# Patient Record
Sex: Female | Born: 1950 | Race: White | Hispanic: No | Marital: Single | Smoking: Former smoker
Health system: Southern US, Community
[De-identification: ages and names within clinical notes are randomized; demographics above are authoritative.]

## PROBLEM LIST (undated history)

## (undated) DIAGNOSIS — C539 Malignant neoplasm of cervix uteri, unspecified: Secondary | ICD-10-CM

## (undated) DIAGNOSIS — I509 Heart failure, unspecified: Secondary | ICD-10-CM

## (undated) DIAGNOSIS — G43909 Migraine, unspecified, not intractable, without status migrainosus: Secondary | ICD-10-CM

## (undated) DIAGNOSIS — K8309 Other cholangitis: Secondary | ICD-10-CM

## (undated) DIAGNOSIS — D649 Anemia, unspecified: Secondary | ICD-10-CM

## (undated) DIAGNOSIS — M8080XA Other osteoporosis with current pathological fracture, unspecified site, initial encounter for fracture: Secondary | ICD-10-CM

## (undated) DIAGNOSIS — C801 Malignant (primary) neoplasm, unspecified: Secondary | ICD-10-CM

## (undated) DIAGNOSIS — E785 Hyperlipidemia, unspecified: Secondary | ICD-10-CM

## (undated) DIAGNOSIS — K219 Gastro-esophageal reflux disease without esophagitis: Secondary | ICD-10-CM

## (undated) DIAGNOSIS — I5189 Other ill-defined heart diseases: Secondary | ICD-10-CM

## (undated) DIAGNOSIS — M7989 Other specified soft tissue disorders: Secondary | ICD-10-CM

## (undated) DIAGNOSIS — I82409 Acute embolism and thrombosis of unspecified deep veins of unspecified lower extremity: Principal | ICD-10-CM

## (undated) DIAGNOSIS — M1990 Unspecified osteoarthritis, unspecified site: Secondary | ICD-10-CM

## (undated) DIAGNOSIS — I4891 Unspecified atrial fibrillation: Secondary | ICD-10-CM

## (undated) DIAGNOSIS — I890 Lymphedema, not elsewhere classified: Secondary | ICD-10-CM

## (undated) DIAGNOSIS — IMO0001 Reserved for inherently not codable concepts without codable children: Secondary | ICD-10-CM

## (undated) DIAGNOSIS — R7881 Bacteremia: Secondary | ICD-10-CM

## (undated) DIAGNOSIS — K760 Fatty (change of) liver, not elsewhere classified: Secondary | ICD-10-CM

## (undated) DIAGNOSIS — H409 Unspecified glaucoma: Secondary | ICD-10-CM

## (undated) DIAGNOSIS — I10 Essential (primary) hypertension: Secondary | ICD-10-CM

## (undated) DIAGNOSIS — G5600 Carpal tunnel syndrome, unspecified upper limb: Secondary | ICD-10-CM

## (undated) DIAGNOSIS — N279 Small kidney, unspecified: Secondary | ICD-10-CM

## (undated) DIAGNOSIS — K831 Obstruction of bile duct: Secondary | ICD-10-CM

## (undated) DIAGNOSIS — IMO0002 Reserved for concepts with insufficient information to code with codable children: Secondary | ICD-10-CM

## (undated) HISTORY — DX: Reserved for inherently not codable concepts without codable children: IMO0001

## (undated) HISTORY — DX: Unspecified glaucoma: H40.9

## (undated) HISTORY — DX: Acute embolism and thrombosis of unspecified deep veins of unspecified lower extremity: I82.409

## (undated) HISTORY — DX: Anemia, unspecified: D64.9

## (undated) HISTORY — DX: Other osteoporosis with current pathological fracture, unspecified site, initial encounter for fracture: M80.80XA

## (undated) HISTORY — DX: Migraine, unspecified, not intractable, without status migrainosus: G43.909

## (undated) HISTORY — DX: Other specified soft tissue disorders: M79.89

## (undated) HISTORY — DX: Carpal tunnel syndrome, unspecified upper limb: G56.00

## (undated) HISTORY — DX: Lymphedema, not elsewhere classified: I89.0

## (undated) HISTORY — DX: Reserved for concepts with insufficient information to code with codable children: IMO0002

## (undated) HISTORY — DX: Essential (primary) hypertension: I10

## (undated) HISTORY — DX: Malignant neoplasm of cervix uteri, unspecified: C53.9

## (undated) HISTORY — PX: TONSILLECTOMY: SUR1361

## (undated) HISTORY — DX: Other ill-defined heart diseases: I51.89

## (undated) HISTORY — DX: Fatty (change of) liver, not elsewhere classified: K76.0

## (undated) HISTORY — PX: URETERAL STENT PLACEMENT: SHX822

## (undated) HISTORY — DX: Hyperlipidemia, unspecified: E78.5

## (undated) HISTORY — PX: CARDIOVERSION: SHX1299

## (undated) HISTORY — DX: Unspecified atrial fibrillation: I48.91

---

## 1982-03-01 HISTORY — PX: CHOLECYSTECTOMY: SHX55

## 2003-02-01 ENCOUNTER — Encounter (INDEPENDENT_AMBULATORY_CARE_PROVIDER_SITE_OTHER): Payer: Self-pay | Admitting: *Deleted

## 2003-02-01 ENCOUNTER — Encounter: Admission: RE | Admit: 2003-02-01 | Discharge: 2003-02-01 | Payer: Self-pay | Admitting: Family Medicine

## 2003-03-02 HISTORY — PX: TOTAL ABDOMINAL HYSTERECTOMY: SHX209

## 2003-03-02 HISTORY — PX: OOPHORECTOMY: SHX86

## 2003-03-25 ENCOUNTER — Ambulatory Visit (HOSPITAL_COMMUNITY): Admission: RE | Admit: 2003-03-25 | Discharge: 2003-03-25 | Payer: Self-pay | Admitting: Obstetrics and Gynecology

## 2003-03-25 ENCOUNTER — Encounter (INDEPENDENT_AMBULATORY_CARE_PROVIDER_SITE_OTHER): Payer: Self-pay | Admitting: Specialist

## 2003-03-27 ENCOUNTER — Emergency Department (HOSPITAL_COMMUNITY): Admission: EM | Admit: 2003-03-27 | Discharge: 2003-03-27 | Payer: Self-pay | Admitting: Emergency Medicine

## 2003-03-29 ENCOUNTER — Encounter: Payer: Self-pay | Admitting: Obstetrics & Gynecology

## 2003-03-29 ENCOUNTER — Inpatient Hospital Stay (HOSPITAL_COMMUNITY): Admission: EM | Admit: 2003-03-29 | Discharge: 2003-04-04 | Payer: Self-pay | Admitting: Emergency Medicine

## 2003-04-23 ENCOUNTER — Ambulatory Visit: Admission: RE | Admit: 2003-04-23 | Discharge: 2003-04-23 | Payer: Self-pay | Admitting: Gynecologic Oncology

## 2003-05-15 ENCOUNTER — Encounter (INDEPENDENT_AMBULATORY_CARE_PROVIDER_SITE_OTHER): Payer: Self-pay | Admitting: Specialist

## 2003-05-15 ENCOUNTER — Inpatient Hospital Stay (HOSPITAL_COMMUNITY): Admission: RE | Admit: 2003-05-15 | Discharge: 2003-05-19 | Payer: Self-pay | Admitting: Family Medicine

## 2003-07-09 ENCOUNTER — Ambulatory Visit: Admission: RE | Admit: 2003-07-09 | Discharge: 2003-07-09 | Payer: Self-pay | Admitting: Gynecologic Oncology

## 2003-07-11 ENCOUNTER — Ambulatory Visit (HOSPITAL_COMMUNITY): Admission: RE | Admit: 2003-07-11 | Discharge: 2003-07-11 | Payer: Self-pay | Admitting: Urology

## 2003-10-17 ENCOUNTER — Encounter: Admission: RE | Admit: 2003-10-17 | Discharge: 2003-10-17 | Payer: Self-pay | Admitting: Obstetrics and Gynecology

## 2003-10-17 ENCOUNTER — Encounter (INDEPENDENT_AMBULATORY_CARE_PROVIDER_SITE_OTHER): Payer: Self-pay | Admitting: *Deleted

## 2004-03-01 ENCOUNTER — Encounter (INDEPENDENT_AMBULATORY_CARE_PROVIDER_SITE_OTHER): Payer: Self-pay | Admitting: *Deleted

## 2004-08-14 ENCOUNTER — Ambulatory Visit: Payer: Self-pay | Admitting: Cardiovascular Disease

## 2004-08-14 ENCOUNTER — Inpatient Hospital Stay (HOSPITAL_COMMUNITY): Admission: EM | Admit: 2004-08-14 | Discharge: 2004-08-15 | Payer: Self-pay | Admitting: Emergency Medicine

## 2004-08-28 ENCOUNTER — Ambulatory Visit: Payer: Self-pay

## 2004-10-01 ENCOUNTER — Ambulatory Visit: Payer: Self-pay | Admitting: Internal Medicine

## 2005-01-14 ENCOUNTER — Encounter: Payer: Self-pay | Admitting: Family Medicine

## 2005-01-14 ENCOUNTER — Ambulatory Visit: Payer: Self-pay | Admitting: *Deleted

## 2005-01-19 ENCOUNTER — Ambulatory Visit: Payer: Self-pay | Admitting: Internal Medicine

## 2006-07-11 ENCOUNTER — Ambulatory Visit: Payer: Self-pay | Admitting: Internal Medicine

## 2006-07-18 ENCOUNTER — Ambulatory Visit: Payer: Self-pay | Admitting: Internal Medicine

## 2006-07-22 ENCOUNTER — Ambulatory Visit: Payer: Self-pay | Admitting: Internal Medicine

## 2006-08-10 ENCOUNTER — Ambulatory Visit: Payer: Self-pay | Admitting: Internal Medicine

## 2006-10-19 ENCOUNTER — Ambulatory Visit: Payer: Self-pay | Admitting: Internal Medicine

## 2006-12-01 ENCOUNTER — Ambulatory Visit: Payer: Self-pay | Admitting: Internal Medicine

## 2007-03-02 HISTORY — PX: OTHER SURGICAL HISTORY: SHX169

## 2007-09-12 ENCOUNTER — Ambulatory Visit: Payer: Self-pay | Admitting: Internal Medicine

## 2007-09-12 LAB — CONVERTED CEMR LAB
BUN: 21 mg/dL
Basophils Absolute: 0 K/uL
Basophils Relative: 0.4 %
CO2: 27 meq/L
Calcium: 9.9 mg/dL
Chloride: 103 meq/L
Creatinine, Ser: 1.3 mg/dL — ABNORMAL HIGH
Eosinophils Absolute: 0.3 K/uL
Eosinophils Relative: 5.6 % — ABNORMAL HIGH
GFR calc Af Amer: 55 mL/min
GFR calc non Af Amer: 45 mL/min
Glucose, Bld: 81 mg/dL
HCT: 38.4 %
Hemoglobin: 13.4 g/dL
INR: 0.9
Lymphocytes Relative: 27.1 %
MCHC: 35 g/dL
MCV: 90.3 fL
Monocytes Absolute: 0.7 K/uL
Monocytes Relative: 10.9 %
Neutro Abs: 3.4 K/uL
Neutrophils Relative %: 56 %
Platelets: 250 K/uL
Potassium: 4.4 meq/L
Prothrombin Time: 11.1 s
RBC: 4.25 M/uL
RDW: 13.3 %
Sodium: 139 meq/L
WBC: 6 10*3/microliter
aPTT: 27 s

## 2007-09-18 ENCOUNTER — Ambulatory Visit: Payer: Self-pay | Admitting: Cardiovascular Disease

## 2007-09-19 ENCOUNTER — Ambulatory Visit: Payer: Self-pay | Admitting: Cardiology

## 2007-09-20 ENCOUNTER — Ambulatory Visit: Payer: Self-pay | Admitting: Internal Medicine

## 2007-09-20 ENCOUNTER — Ambulatory Visit (HOSPITAL_COMMUNITY): Admission: RE | Admit: 2007-09-20 | Discharge: 2007-09-20 | Payer: Self-pay | Admitting: Internal Medicine

## 2007-09-20 ENCOUNTER — Encounter: Payer: Self-pay | Admitting: Internal Medicine

## 2007-10-03 ENCOUNTER — Ambulatory Visit: Payer: Self-pay | Admitting: Internal Medicine

## 2007-10-03 ENCOUNTER — Ambulatory Visit: Payer: Self-pay | Admitting: Cardiology

## 2007-10-03 LAB — CONVERTED CEMR LAB
BUN: 25 mg/dL — ABNORMAL HIGH (ref 6–23)
Chloride: 104 meq/L (ref 96–112)
GFR calc non Af Amer: 41 mL/min
Potassium: 4.6 meq/L (ref 3.5–5.1)

## 2007-10-05 ENCOUNTER — Ambulatory Visit: Payer: Self-pay | Admitting: Internal Medicine

## 2007-10-25 ENCOUNTER — Ambulatory Visit: Payer: Self-pay | Admitting: Internal Medicine

## 2007-10-25 ENCOUNTER — Ambulatory Visit: Payer: Self-pay | Admitting: Cardiovascular Disease

## 2007-11-22 ENCOUNTER — Ambulatory Visit: Payer: Self-pay | Admitting: Cardiology

## 2007-12-05 ENCOUNTER — Ambulatory Visit: Payer: Self-pay | Admitting: Cardiovascular Disease

## 2007-12-21 ENCOUNTER — Ambulatory Visit: Payer: Self-pay | Admitting: Internal Medicine

## 2007-12-21 ENCOUNTER — Ambulatory Visit: Payer: Self-pay | Admitting: Cardiology

## 2007-12-21 LAB — CONVERTED CEMR LAB
CO2: 27 meq/L (ref 19–32)
Chloride: 106 meq/L (ref 96–112)
Creatinine, Ser: 1.4 mg/dL — ABNORMAL HIGH (ref 0.4–1.2)
Magnesium: 2.5 mg/dL (ref 1.5–2.5)
Potassium: 4.6 meq/L (ref 3.5–5.1)
TSH: 2.34 microintl units/mL (ref 0.35–5.50)
Total CK: 86 units/L (ref 7–177)

## 2008-01-04 ENCOUNTER — Ambulatory Visit: Payer: Self-pay | Admitting: Cardiology

## 2008-01-22 ENCOUNTER — Ambulatory Visit: Payer: Self-pay | Admitting: Cardiology

## 2008-02-22 ENCOUNTER — Ambulatory Visit: Payer: Self-pay | Admitting: Internal Medicine

## 2008-03-13 ENCOUNTER — Ambulatory Visit: Payer: Self-pay | Admitting: Internal Medicine

## 2008-03-25 ENCOUNTER — Ambulatory Visit: Payer: Self-pay | Admitting: Internal Medicine

## 2008-04-17 ENCOUNTER — Ambulatory Visit (HOSPITAL_COMMUNITY): Admission: RE | Admit: 2008-04-17 | Discharge: 2008-04-17 | Payer: Self-pay | Admitting: Urology

## 2008-04-22 ENCOUNTER — Ambulatory Visit (HOSPITAL_COMMUNITY): Admission: RE | Admit: 2008-04-22 | Discharge: 2008-04-22 | Payer: Self-pay | Admitting: Urology

## 2008-04-29 ENCOUNTER — Ambulatory Visit (HOSPITAL_COMMUNITY): Admission: RE | Admit: 2008-04-29 | Discharge: 2008-04-29 | Payer: Self-pay | Admitting: Urology

## 2008-05-02 ENCOUNTER — Encounter (INDEPENDENT_AMBULATORY_CARE_PROVIDER_SITE_OTHER): Payer: Self-pay | Admitting: Interventional Radiology

## 2008-05-02 ENCOUNTER — Ambulatory Visit (HOSPITAL_COMMUNITY): Admission: RE | Admit: 2008-05-02 | Discharge: 2008-05-02 | Payer: Self-pay | Admitting: Urology

## 2008-05-15 ENCOUNTER — Ambulatory Visit: Payer: Self-pay | Admitting: Oncology

## 2008-05-16 ENCOUNTER — Ambulatory Visit (HOSPITAL_COMMUNITY): Admission: RE | Admit: 2008-05-16 | Discharge: 2008-05-16 | Payer: Self-pay | Admitting: Gynecology

## 2008-05-16 ENCOUNTER — Ambulatory Visit: Admission: RE | Admit: 2008-05-16 | Discharge: 2008-07-30 | Payer: Self-pay | Admitting: Radiation Oncology

## 2008-05-30 ENCOUNTER — Encounter: Payer: Self-pay | Admitting: Internal Medicine

## 2008-05-30 LAB — CBC WITH DIFFERENTIAL/PLATELET
BASO%: 0.9 % (ref 0.0–2.0)
HCT: 35 % (ref 34.8–46.6)
MCHC: 35.2 g/dL (ref 31.5–36.0)
MONO#: 0.5 10*3/uL (ref 0.1–0.9)
NEUT%: 53 % (ref 38.4–76.8)
RDW: 13.1 % (ref 11.2–14.5)
WBC: 4.7 10*3/uL (ref 3.9–10.3)
lymph#: 1.4 10*3/uL (ref 0.9–3.3)

## 2008-05-30 LAB — COMPREHENSIVE METABOLIC PANEL
ALT: 50 U/L — ABNORMAL HIGH (ref 0–35)
Albumin: 4.3 g/dL (ref 3.5–5.2)
CO2: 21 mEq/L (ref 19–32)
Calcium: 9.8 mg/dL (ref 8.4–10.5)
Chloride: 106 mEq/L (ref 96–112)
Creatinine, Ser: 1.45 mg/dL — ABNORMAL HIGH (ref 0.40–1.20)
Potassium: 4.6 mEq/L (ref 3.5–5.3)
Sodium: 138 mEq/L (ref 135–145)
Total Protein: 7.2 g/dL (ref 6.0–8.3)

## 2008-05-30 LAB — MAGNESIUM: Magnesium: 2.3 mg/dL (ref 1.5–2.5)

## 2008-06-05 ENCOUNTER — Ambulatory Visit: Payer: Self-pay | Admitting: Internal Medicine

## 2008-06-10 LAB — COMPREHENSIVE METABOLIC PANEL
ALT: 63 U/L — ABNORMAL HIGH (ref 0–35)
CO2: 21 mEq/L (ref 19–32)
Creatinine, Ser: 1.06 mg/dL (ref 0.40–1.20)
Total Bilirubin: 0.3 mg/dL (ref 0.3–1.2)

## 2008-06-10 LAB — CBC WITH DIFFERENTIAL/PLATELET
BASO%: 0.6 % (ref 0.0–2.0)
EOS%: 4.6 % (ref 0.0–7.0)
MCH: 29.2 pg (ref 25.1–34.0)
MCHC: 34.3 g/dL (ref 31.5–36.0)
MONO%: 8.9 % (ref 0.0–14.0)
RBC: 4.24 10*6/uL (ref 3.70–5.45)
RDW: 13.4 % (ref 11.2–14.5)
lymph#: 1.3 10*3/uL (ref 0.9–3.3)

## 2008-06-14 ENCOUNTER — Ambulatory Visit: Payer: Self-pay | Admitting: Cardiology

## 2008-06-14 ENCOUNTER — Ambulatory Visit (HOSPITAL_COMMUNITY): Admission: RE | Admit: 2008-06-14 | Discharge: 2008-06-14 | Payer: Self-pay | Admitting: Oncology

## 2008-06-14 LAB — COMPREHENSIVE METABOLIC PANEL
BUN: 20 mg/dL (ref 6–23)
CO2: 29 mEq/L (ref 19–32)
Calcium: 10.2 mg/dL (ref 8.4–10.5)
Creatinine, Ser: 1.23 mg/dL — ABNORMAL HIGH (ref 0.40–1.20)
Glucose, Bld: 107 mg/dL — ABNORMAL HIGH (ref 70–99)
Total Bilirubin: 0.8 mg/dL (ref 0.3–1.2)

## 2008-06-14 LAB — CBC WITH DIFFERENTIAL/PLATELET
Basophils Absolute: 0 10*3/uL (ref 0.0–0.1)
Eosinophils Absolute: 0.1 10*3/uL (ref 0.0–0.5)
HCT: 37.5 % (ref 34.8–46.6)
HGB: 13 g/dL (ref 11.6–15.9)
LYMPH%: 22.3 % (ref 14.0–49.7)
MCHC: 34.7 g/dL (ref 31.5–36.0)
MONO#: 0.5 10*3/uL (ref 0.1–0.9)
NEUT#: 3.2 10*3/uL (ref 1.5–6.5)
NEUT%: 66.5 % (ref 38.4–76.8)
Platelets: 245 10*3/uL (ref 145–400)
WBC: 4.8 10*3/uL (ref 3.9–10.3)

## 2008-06-14 LAB — URINALYSIS, MICROSCOPIC - CHCC
Nitrite: NEGATIVE
Protein: 100 mg/dL
Specific Gravity, Urine: 1.015 (ref 1.003–1.035)

## 2008-06-14 LAB — MAGNESIUM: Magnesium: 2.4 mg/dL (ref 1.5–2.5)

## 2008-06-17 ENCOUNTER — Ambulatory Visit (HOSPITAL_COMMUNITY): Admission: RE | Admit: 2008-06-17 | Discharge: 2008-06-17 | Payer: Self-pay | Admitting: Oncology

## 2008-06-17 LAB — BASIC METABOLIC PANEL
BUN: 14 mg/dL (ref 6–23)
Creatinine, Ser: 1.16 mg/dL (ref 0.40–1.20)
Glucose, Bld: 112 mg/dL — ABNORMAL HIGH (ref 70–99)
Potassium: 4.6 mEq/L (ref 3.5–5.3)

## 2008-06-18 LAB — URINE CULTURE

## 2008-06-20 ENCOUNTER — Ambulatory Visit: Payer: Self-pay | Admitting: Internal Medicine

## 2008-06-21 ENCOUNTER — Encounter: Payer: Self-pay | Admitting: Internal Medicine

## 2008-06-21 LAB — CBC WITH DIFFERENTIAL/PLATELET
BASO%: 0.5 % (ref 0.0–2.0)
LYMPH%: 20.6 % (ref 14.0–49.7)
MCHC: 35.2 g/dL (ref 31.5–36.0)
MONO#: 0.5 10*3/uL (ref 0.1–0.9)
MONO%: 17.1 % — ABNORMAL HIGH (ref 0.0–14.0)
Platelets: 217 10*3/uL (ref 145–400)
RBC: 4.18 10*6/uL (ref 3.70–5.45)
WBC: 3 10*3/uL — ABNORMAL LOW (ref 3.9–10.3)

## 2008-06-21 LAB — COMPREHENSIVE METABOLIC PANEL
ALT: 34 U/L (ref 0–35)
Alkaline Phosphatase: 99 U/L (ref 39–117)
Sodium: 131 mEq/L — ABNORMAL LOW (ref 135–145)
Total Bilirubin: 0.4 mg/dL (ref 0.3–1.2)
Total Protein: 7.2 g/dL (ref 6.0–8.3)

## 2008-06-28 LAB — CBC WITH DIFFERENTIAL/PLATELET
BASO%: 0.5 % (ref 0.0–2.0)
EOS%: 4.4 % (ref 0.0–7.0)
HCT: 31.7 % — ABNORMAL LOW (ref 34.8–46.6)
LYMPH%: 23.5 % (ref 14.0–49.7)
MCH: 29.3 pg (ref 25.1–34.0)
MCHC: 34.1 g/dL (ref 31.5–36.0)
NEUT%: 55.9 % (ref 38.4–76.8)
Platelets: 103 10*3/uL — ABNORMAL LOW (ref 145–400)

## 2008-06-28 LAB — COMPREHENSIVE METABOLIC PANEL
AST: 15 U/L (ref 0–37)
Albumin: 3.6 g/dL (ref 3.5–5.2)
BUN: 26 mg/dL — ABNORMAL HIGH (ref 6–23)
CO2: 25 mEq/L (ref 19–32)
Calcium: 8.8 mg/dL (ref 8.4–10.5)
Chloride: 104 mEq/L (ref 96–112)
Creatinine, Ser: 1.21 mg/dL — ABNORMAL HIGH (ref 0.40–1.20)
Glucose, Bld: 114 mg/dL — ABNORMAL HIGH (ref 70–99)
Potassium: 4.5 mEq/L (ref 3.5–5.3)

## 2008-06-28 LAB — MAGNESIUM: Magnesium: 1.9 mg/dL (ref 1.5–2.5)

## 2008-07-01 ENCOUNTER — Ambulatory Visit: Payer: Self-pay | Admitting: Oncology

## 2008-07-05 ENCOUNTER — Encounter: Payer: Self-pay | Admitting: Internal Medicine

## 2008-07-05 LAB — CBC WITH DIFFERENTIAL/PLATELET
BASO%: 0.5 % (ref 0.0–2.0)
EOS%: 3.6 % (ref 0.0–7.0)
Eosinophils Absolute: 0.1 10*3/uL (ref 0.0–0.5)
MCV: 86 fL (ref 79.5–101.0)
MONO%: 12.2 % (ref 0.0–14.0)
NEUT#: 1.2 10*3/uL — ABNORMAL LOW (ref 1.5–6.5)
RBC: 3.21 10*6/uL — ABNORMAL LOW (ref 3.70–5.45)
RDW: 14.2 % (ref 11.2–14.5)

## 2008-07-05 LAB — MAGNESIUM: Magnesium: 1.9 mg/dL (ref 1.5–2.5)

## 2008-07-05 LAB — BASIC METABOLIC PANEL
Potassium: 4.1 mEq/L (ref 3.5–5.3)
Sodium: 139 mEq/L (ref 135–145)

## 2008-07-10 ENCOUNTER — Ambulatory Visit: Payer: Self-pay | Admitting: Cardiology

## 2008-07-12 ENCOUNTER — Encounter: Payer: Self-pay | Admitting: Internal Medicine

## 2008-07-12 LAB — COMPREHENSIVE METABOLIC PANEL
AST: 15 U/L (ref 0–37)
Alkaline Phosphatase: 119 U/L — ABNORMAL HIGH (ref 39–117)
Glucose, Bld: 103 mg/dL — ABNORMAL HIGH (ref 70–99)
Sodium: 135 mEq/L (ref 135–145)
Total Bilirubin: 0.5 mg/dL (ref 0.3–1.2)
Total Protein: 7.1 g/dL (ref 6.0–8.3)

## 2008-07-12 LAB — CBC WITH DIFFERENTIAL/PLATELET
BASO%: 0.5 % (ref 0.0–2.0)
Basophils Absolute: 0 10*3/uL (ref 0.0–0.1)
EOS%: 1.8 % (ref 0.0–7.0)
HCT: 29.2 % — ABNORMAL LOW (ref 34.8–46.6)
HGB: 10.2 g/dL — ABNORMAL LOW (ref 11.6–15.9)
MONO#: 0.4 10*3/uL (ref 0.1–0.9)
NEUT#: 1.7 10*3/uL (ref 1.5–6.5)
NEUT%: 63.3 % (ref 38.4–76.8)
WBC: 2.7 10*3/uL — ABNORMAL LOW (ref 3.9–10.3)
lymph#: 0.5 10*3/uL — ABNORMAL LOW (ref 0.9–3.3)

## 2008-07-12 LAB — URINALYSIS, MICROSCOPIC - CHCC
Ketones: NEGATIVE mg/dL
Protein: 2000 mg/dL
Specific Gravity, Urine: 1.03 (ref 1.003–1.035)

## 2008-07-15 LAB — URINE CULTURE

## 2008-07-19 ENCOUNTER — Ambulatory Visit: Payer: Self-pay | Admitting: Cardiology

## 2008-07-26 ENCOUNTER — Ambulatory Visit: Payer: Self-pay | Admitting: Cardiology

## 2008-07-26 ENCOUNTER — Encounter: Payer: Self-pay | Admitting: Internal Medicine

## 2008-07-26 LAB — CBC WITH DIFFERENTIAL/PLATELET
Eosinophils Absolute: 0 10*3/uL (ref 0.0–0.5)
MONO#: 0.4 10*3/uL (ref 0.1–0.9)
NEUT#: 1.4 10*3/uL — ABNORMAL LOW (ref 1.5–6.5)
Platelets: 147 10*3/uL (ref 145–400)
RBC: 2.97 10*6/uL — ABNORMAL LOW (ref 3.70–5.45)
RDW: 19.1 % — ABNORMAL HIGH (ref 11.2–14.5)
WBC: 2.2 10*3/uL — ABNORMAL LOW (ref 3.9–10.3)

## 2008-07-26 LAB — URINALYSIS, MICROSCOPIC - CHCC
Glucose: NEGATIVE g/dL
Specific Gravity, Urine: 1.03 (ref 1.003–1.035)

## 2008-07-26 LAB — BASIC METABOLIC PANEL
CO2: 23 mEq/L (ref 19–32)
Chloride: 104 mEq/L (ref 96–112)
Glucose, Bld: 156 mg/dL — ABNORMAL HIGH (ref 70–99)
Potassium: 3.7 mEq/L (ref 3.5–5.3)
Sodium: 139 mEq/L (ref 135–145)

## 2008-07-26 LAB — CONVERTED CEMR LAB
POC INR: 1.2
Protime: 13.7

## 2008-07-28 LAB — URINE CULTURE

## 2008-07-30 ENCOUNTER — Encounter: Payer: Self-pay | Admitting: *Deleted

## 2008-07-31 ENCOUNTER — Encounter (INDEPENDENT_AMBULATORY_CARE_PROVIDER_SITE_OTHER): Payer: Self-pay | Admitting: *Deleted

## 2008-07-31 ENCOUNTER — Encounter: Payer: Self-pay | Admitting: Internal Medicine

## 2008-07-31 DIAGNOSIS — H409 Unspecified glaucoma: Secondary | ICD-10-CM | POA: Insufficient documentation

## 2008-07-31 DIAGNOSIS — I4891 Unspecified atrial fibrillation: Secondary | ICD-10-CM | POA: Insufficient documentation

## 2008-07-31 DIAGNOSIS — C539 Malignant neoplasm of cervix uteri, unspecified: Secondary | ICD-10-CM | POA: Insufficient documentation

## 2008-07-31 DIAGNOSIS — F329 Major depressive disorder, single episode, unspecified: Secondary | ICD-10-CM

## 2008-07-31 DIAGNOSIS — G5600 Carpal tunnel syndrome, unspecified upper limb: Secondary | ICD-10-CM

## 2008-07-31 DIAGNOSIS — G43909 Migraine, unspecified, not intractable, without status migrainosus: Secondary | ICD-10-CM | POA: Insufficient documentation

## 2008-07-31 DIAGNOSIS — I10 Essential (primary) hypertension: Secondary | ICD-10-CM | POA: Insufficient documentation

## 2008-08-01 ENCOUNTER — Ambulatory Visit: Payer: Self-pay | Admitting: Internal Medicine

## 2008-08-01 DIAGNOSIS — N390 Urinary tract infection, site not specified: Secondary | ICD-10-CM | POA: Insufficient documentation

## 2008-08-01 LAB — CONVERTED CEMR LAB
Urine Glucose: 100 mg/dL
Urobilinogen, UA: 2 (ref 0.0–1.0)

## 2008-08-02 ENCOUNTER — Encounter: Payer: Self-pay | Admitting: Internal Medicine

## 2008-08-05 ENCOUNTER — Ambulatory Visit: Payer: Self-pay | Admitting: Cardiovascular Disease

## 2008-08-05 LAB — CONVERTED CEMR LAB: POC INR: 1.2

## 2008-08-15 ENCOUNTER — Ambulatory Visit: Payer: Self-pay | Admitting: Internal Medicine

## 2008-08-15 DIAGNOSIS — I951 Orthostatic hypotension: Secondary | ICD-10-CM | POA: Insufficient documentation

## 2008-08-15 LAB — CONVERTED CEMR LAB
Calcium: 9.5 mg/dL (ref 8.4–10.5)
GFR calc non Af Amer: 49.32 mL/min (ref >60)
Potassium: 4 meq/L (ref 3.5–5.1)
Sodium: 138 meq/L (ref 135–145)

## 2008-08-22 ENCOUNTER — Ambulatory Visit: Payer: Self-pay | Admitting: Oncology

## 2008-08-23 ENCOUNTER — Ambulatory Visit (HOSPITAL_BASED_OUTPATIENT_CLINIC_OR_DEPARTMENT_OTHER): Admission: RE | Admit: 2008-08-23 | Discharge: 2008-08-23 | Payer: Self-pay | Admitting: Urology

## 2008-08-26 ENCOUNTER — Telehealth: Payer: Self-pay | Admitting: Internal Medicine

## 2008-08-26 LAB — CBC WITH DIFFERENTIAL/PLATELET
Basophils Absolute: 0 10*3/uL (ref 0.0–0.1)
EOS%: 2.1 % (ref 0.0–7.0)
Eosinophils Absolute: 0.1 10*3/uL (ref 0.0–0.5)
HGB: 10.7 g/dL — ABNORMAL LOW (ref 11.6–15.9)
MONO#: 0.5 10*3/uL (ref 0.1–0.9)
NEUT#: 2 10*3/uL (ref 1.5–6.5)
RDW: 20 % — ABNORMAL HIGH (ref 11.2–14.5)
WBC: 3.3 10*3/uL — ABNORMAL LOW (ref 3.9–10.3)
lymph#: 0.7 10*3/uL — ABNORMAL LOW (ref 0.9–3.3)

## 2008-08-26 LAB — COMPREHENSIVE METABOLIC PANEL
AST: 19 U/L (ref 0–37)
Albumin: 3.8 g/dL (ref 3.5–5.2)
BUN: 17 mg/dL (ref 6–23)
Calcium: 9.6 mg/dL (ref 8.4–10.5)
Chloride: 104 mEq/L (ref 96–112)
Glucose, Bld: 96 mg/dL (ref 70–99)
Potassium: 4.2 mEq/L (ref 3.5–5.3)

## 2008-08-27 ENCOUNTER — Encounter: Payer: Self-pay | Admitting: Internal Medicine

## 2008-08-27 LAB — CONVERTED CEMR LAB: Digitoxin Lvl: 0.8 ng/mL (ref 0.8–2.0)

## 2008-09-04 ENCOUNTER — Encounter: Payer: Self-pay | Admitting: *Deleted

## 2008-09-04 DIAGNOSIS — I5032 Chronic diastolic (congestive) heart failure: Secondary | ICD-10-CM

## 2008-09-05 ENCOUNTER — Ambulatory Visit: Payer: Self-pay | Admitting: Internal Medicine

## 2008-09-05 ENCOUNTER — Telehealth: Payer: Self-pay | Admitting: Internal Medicine

## 2008-09-05 LAB — CONVERTED CEMR LAB
Chloride: 104 meq/L (ref 96–112)
Creatinine, Ser: 1.1 mg/dL (ref 0.4–1.2)
GFR calc non Af Amer: 54.52 mL/min (ref >60)
Potassium: 3.9 meq/L (ref 3.5–5.1)

## 2008-09-09 ENCOUNTER — Encounter: Payer: Self-pay | Admitting: Internal Medicine

## 2008-09-09 ENCOUNTER — Ambulatory Visit: Payer: Self-pay

## 2008-09-19 ENCOUNTER — Ambulatory Visit: Payer: Self-pay | Admitting: Internal Medicine

## 2008-09-19 DIAGNOSIS — D649 Anemia, unspecified: Secondary | ICD-10-CM

## 2008-09-20 LAB — CONVERTED CEMR LAB
BUN: 16 mg/dL (ref 6–23)
Basophils Absolute: 0 10*3/uL (ref 0.0–0.1)
CO2: 28 meq/L (ref 19–32)
Eosinophils Absolute: 0.2 10*3/uL (ref 0.0–0.7)
Folate: 16.8 ng/mL
HCT: 32.8 % — ABNORMAL LOW (ref 36.0–46.0)
Hemoglobin: 11.3 g/dL — ABNORMAL LOW (ref 12.0–15.0)
Lymphs Abs: 0.7 10*3/uL (ref 0.7–4.0)
MCHC: 34.5 g/dL (ref 30.0–36.0)
Monocytes Relative: 15.2 % — ABNORMAL HIGH (ref 3.0–12.0)
Neutro Abs: 2.2 10*3/uL (ref 1.4–7.7)
Platelets: 216 10*3/uL (ref 150.0–400.0)
RDW: 14.7 % — ABNORMAL HIGH (ref 11.5–14.6)
Saturation Ratios: 20.1 % (ref 20.0–50.0)
Sodium: 140 meq/L (ref 135–145)
Transferrin: 245.5 mg/dL (ref 212.0–360.0)
Vitamin B-12: 476 pg/mL (ref 211–911)

## 2008-10-03 ENCOUNTER — Ambulatory Visit: Payer: Self-pay | Admitting: Internal Medicine

## 2008-10-04 ENCOUNTER — Ambulatory Visit: Admission: RE | Admit: 2008-10-04 | Discharge: 2008-10-04 | Payer: Self-pay | Admitting: Gynecology

## 2008-10-10 ENCOUNTER — Encounter: Payer: Self-pay | Admitting: Cardiology

## 2008-10-15 ENCOUNTER — Encounter (INDEPENDENT_AMBULATORY_CARE_PROVIDER_SITE_OTHER): Payer: Self-pay | Admitting: *Deleted

## 2008-11-13 ENCOUNTER — Ambulatory Visit: Payer: Self-pay | Admitting: Internal Medicine

## 2008-12-02 ENCOUNTER — Ambulatory Visit (HOSPITAL_COMMUNITY): Admission: RE | Admit: 2008-12-02 | Discharge: 2008-12-02 | Payer: Self-pay | Admitting: Gynecology

## 2008-12-04 ENCOUNTER — Ambulatory Visit: Admission: RE | Admit: 2008-12-04 | Discharge: 2008-12-04 | Payer: Self-pay | Admitting: Gynecology

## 2008-12-17 ENCOUNTER — Ambulatory Visit (HOSPITAL_BASED_OUTPATIENT_CLINIC_OR_DEPARTMENT_OTHER): Admission: RE | Admit: 2008-12-17 | Discharge: 2008-12-17 | Payer: Self-pay | Admitting: Urology

## 2009-02-20 ENCOUNTER — Ambulatory Visit: Payer: Self-pay | Admitting: Internal Medicine

## 2009-03-03 ENCOUNTER — Ambulatory Visit: Payer: Self-pay | Admitting: Oncology

## 2009-03-05 ENCOUNTER — Ambulatory Visit: Payer: Self-pay | Admitting: Internal Medicine

## 2009-03-06 LAB — BUN: BUN: 19 mg/dL (ref 6–23)

## 2009-03-07 ENCOUNTER — Emergency Department (HOSPITAL_COMMUNITY): Admission: RE | Admit: 2009-03-07 | Discharge: 2009-03-07 | Payer: Self-pay | Admitting: Urology

## 2009-03-07 ENCOUNTER — Telehealth: Payer: Self-pay | Admitting: Internal Medicine

## 2009-03-07 ENCOUNTER — Telehealth (INDEPENDENT_AMBULATORY_CARE_PROVIDER_SITE_OTHER): Payer: Self-pay | Admitting: *Deleted

## 2009-03-10 ENCOUNTER — Ambulatory Visit (HOSPITAL_COMMUNITY): Admission: RE | Admit: 2009-03-10 | Discharge: 2009-03-10 | Payer: Self-pay | Admitting: Gynecology

## 2009-03-19 ENCOUNTER — Ambulatory Visit: Payer: Self-pay | Admitting: Internal Medicine

## 2009-03-21 ENCOUNTER — Ambulatory Visit: Admission: RE | Admit: 2009-03-21 | Discharge: 2009-03-21 | Payer: Self-pay | Admitting: Gynecology

## 2009-03-21 ENCOUNTER — Other Ambulatory Visit: Admission: RE | Admit: 2009-03-21 | Discharge: 2009-03-21 | Payer: Self-pay | Admitting: Gynecology

## 2009-04-09 ENCOUNTER — Ambulatory Visit: Payer: Self-pay | Admitting: Internal Medicine

## 2009-04-10 ENCOUNTER — Telehealth (INDEPENDENT_AMBULATORY_CARE_PROVIDER_SITE_OTHER): Payer: Self-pay | Admitting: *Deleted

## 2009-04-18 ENCOUNTER — Encounter: Admission: RE | Admit: 2009-04-18 | Discharge: 2009-04-18 | Payer: Self-pay | Admitting: Internal Medicine

## 2009-04-18 LAB — HM MAMMOGRAPHY: HM Mammogram: NEGATIVE

## 2009-04-21 ENCOUNTER — Ambulatory Visit: Payer: Self-pay | Admitting: Internal Medicine

## 2009-05-05 ENCOUNTER — Telehealth (INDEPENDENT_AMBULATORY_CARE_PROVIDER_SITE_OTHER): Payer: Self-pay | Admitting: *Deleted

## 2009-05-06 ENCOUNTER — Ambulatory Visit (HOSPITAL_BASED_OUTPATIENT_CLINIC_OR_DEPARTMENT_OTHER): Admission: RE | Admit: 2009-05-06 | Discharge: 2009-05-06 | Payer: Self-pay | Admitting: Urology

## 2009-05-13 ENCOUNTER — Encounter (INDEPENDENT_AMBULATORY_CARE_PROVIDER_SITE_OTHER): Payer: Self-pay | Admitting: *Deleted

## 2009-05-14 ENCOUNTER — Ambulatory Visit: Payer: Self-pay | Admitting: Internal Medicine

## 2009-05-19 ENCOUNTER — Ambulatory Visit: Payer: Self-pay | Admitting: Internal Medicine

## 2009-05-26 ENCOUNTER — Ambulatory Visit: Payer: Self-pay | Admitting: Internal Medicine

## 2009-07-02 ENCOUNTER — Ambulatory Visit: Payer: Self-pay | Admitting: Internal Medicine

## 2009-07-10 ENCOUNTER — Ambulatory Visit: Payer: Self-pay | Admitting: Internal Medicine

## 2009-07-10 ENCOUNTER — Ambulatory Visit: Payer: Self-pay | Admitting: Oncology

## 2009-07-11 LAB — BUN: BUN: 15 mg/dL (ref 6–23)

## 2009-07-15 ENCOUNTER — Ambulatory Visit (HOSPITAL_COMMUNITY): Admission: RE | Admit: 2009-07-15 | Discharge: 2009-07-15 | Payer: Self-pay | Admitting: Gynecology

## 2009-07-18 ENCOUNTER — Other Ambulatory Visit: Admission: RE | Admit: 2009-07-18 | Discharge: 2009-07-18 | Payer: Self-pay | Admitting: Gynecology

## 2009-07-18 ENCOUNTER — Ambulatory Visit: Admission: RE | Admit: 2009-07-18 | Discharge: 2009-07-18 | Payer: Self-pay | Admitting: Gynecology

## 2009-08-01 ENCOUNTER — Ambulatory Visit: Payer: Self-pay | Admitting: Internal Medicine

## 2009-08-01 DIAGNOSIS — H60509 Unspecified acute noninfective otitis externa, unspecified ear: Secondary | ICD-10-CM

## 2009-08-21 ENCOUNTER — Ambulatory Visit: Payer: Self-pay | Admitting: Internal Medicine

## 2009-10-13 ENCOUNTER — Telehealth: Payer: Self-pay | Admitting: Internal Medicine

## 2009-10-20 ENCOUNTER — Ambulatory Visit: Payer: Self-pay | Admitting: Internal Medicine

## 2009-11-11 ENCOUNTER — Ambulatory Visit (HOSPITAL_BASED_OUTPATIENT_CLINIC_OR_DEPARTMENT_OTHER): Admission: RE | Admit: 2009-11-11 | Discharge: 2009-11-11 | Payer: Self-pay | Admitting: Urology

## 2009-12-05 ENCOUNTER — Encounter: Payer: Self-pay | Admitting: Internal Medicine

## 2009-12-12 ENCOUNTER — Ambulatory Visit: Payer: Self-pay | Admitting: Internal Medicine

## 2009-12-17 ENCOUNTER — Ambulatory Visit: Payer: Self-pay | Admitting: Internal Medicine

## 2009-12-19 ENCOUNTER — Inpatient Hospital Stay (HOSPITAL_COMMUNITY): Admission: AD | Admit: 2009-12-19 | Discharge: 2009-12-22 | Payer: Self-pay | Admitting: Internal Medicine

## 2009-12-19 ENCOUNTER — Ambulatory Visit: Payer: Self-pay | Admitting: Internal Medicine

## 2009-12-19 LAB — CONVERTED CEMR LAB
CO2: 28 meq/L (ref 19–32)
Chloride: 107 meq/L (ref 96–112)
Magnesium: 2.3 mg/dL (ref 1.5–2.5)
Potassium: 4 meq/L (ref 3.5–5.1)
Sodium: 140 meq/L (ref 135–145)

## 2009-12-26 ENCOUNTER — Telehealth: Payer: Self-pay | Admitting: Cardiovascular Disease

## 2010-01-19 ENCOUNTER — Ambulatory Visit (HOSPITAL_COMMUNITY)
Admission: RE | Admit: 2010-01-19 | Discharge: 2010-01-19 | Payer: Self-pay | Source: Home / Self Care | Admitting: Gynecology

## 2010-01-21 ENCOUNTER — Ambulatory Visit
Admission: RE | Admit: 2010-01-21 | Discharge: 2010-01-21 | Payer: Self-pay | Source: Home / Self Care | Admitting: Gynecology

## 2010-01-21 ENCOUNTER — Other Ambulatory Visit: Admission: RE | Admit: 2010-01-21 | Discharge: 2010-01-21 | Payer: Self-pay | Admitting: Gynecology

## 2010-01-26 ENCOUNTER — Ambulatory Visit: Payer: Self-pay | Admitting: Internal Medicine

## 2010-01-29 ENCOUNTER — Ambulatory Visit (HOSPITAL_COMMUNITY)
Admission: RE | Admit: 2010-01-29 | Discharge: 2010-01-29 | Payer: Self-pay | Source: Home / Self Care | Admitting: Gynecology

## 2010-01-30 LAB — CONVERTED CEMR LAB
BUN: 17 mg/dL (ref 6–23)
Chloride: 104 meq/L (ref 96–112)
GFR calc non Af Amer: 50.52 mL/min (ref >60)
Glucose, Bld: 81 mg/dL (ref 70–99)
Magnesium: 2.4 mg/dL (ref 1.5–2.5)
Potassium: 3.8 meq/L (ref 3.5–5.1)
Sodium: 138 meq/L (ref 135–145)
T3, Free: 3 pg/mL (ref 2.3–4.2)

## 2010-02-10 ENCOUNTER — Encounter: Payer: Self-pay | Admitting: Internal Medicine

## 2010-02-10 ENCOUNTER — Ambulatory Visit
Admission: RE | Admit: 2010-02-10 | Discharge: 2010-02-10 | Payer: Self-pay | Source: Home / Self Care | Attending: Urology | Admitting: Urology

## 2010-02-11 ENCOUNTER — Ambulatory Visit
Admission: RE | Admit: 2010-02-11 | Discharge: 2010-03-25 | Payer: Self-pay | Source: Home / Self Care | Attending: Radiation Oncology | Admitting: Radiation Oncology

## 2010-03-05 ENCOUNTER — Ambulatory Visit (HOSPITAL_BASED_OUTPATIENT_CLINIC_OR_DEPARTMENT_OTHER): Payer: 59 | Admitting: Oncology

## 2010-03-06 ENCOUNTER — Encounter: Payer: Self-pay | Admitting: Internal Medicine

## 2010-03-06 LAB — COMPREHENSIVE METABOLIC PANEL
ALT: 33 U/L (ref 0–35)
AST: 24 U/L (ref 0–37)
Albumin: 4.2 g/dL (ref 3.5–5.2)
Alkaline Phosphatase: 106 U/L (ref 39–117)
BUN: 17 mg/dL (ref 6–23)
CO2: 23 mEq/L (ref 19–32)
Calcium: 9.8 mg/dL (ref 8.4–10.5)
Chloride: 108 mEq/L (ref 96–112)
Creatinine, Ser: 1.3 mg/dL — ABNORMAL HIGH (ref 0.40–1.20)
Glucose, Bld: 99 mg/dL (ref 70–99)
Potassium: 4.3 mEq/L (ref 3.5–5.3)
Sodium: 144 mEq/L (ref 135–145)
Total Bilirubin: 0.4 mg/dL (ref 0.3–1.2)
Total Protein: 7.1 g/dL (ref 6.0–8.3)

## 2010-03-06 LAB — CBC WITH DIFFERENTIAL/PLATELET
BASO%: 0.8 % (ref 0.0–2.0)
Basophils Absolute: 0 10*3/uL (ref 0.0–0.1)
EOS%: 5.1 % (ref 0.0–7.0)
Eosinophils Absolute: 0.2 10*3/uL (ref 0.0–0.5)
HCT: 37.1 % (ref 34.8–46.6)
HGB: 12.8 g/dL (ref 11.6–15.9)
LYMPH%: 25 % (ref 14.0–49.7)
MCH: 30 pg (ref 25.1–34.0)
MCHC: 34.5 g/dL (ref 31.5–36.0)
MCV: 86.9 fL (ref 79.5–101.0)
MONO#: 0.6 10*3/uL (ref 0.1–0.9)
MONO%: 15.9 % — ABNORMAL HIGH (ref 0.0–14.0)
NEUT#: 2 10*3/uL (ref 1.5–6.5)
NEUT%: 53.2 % (ref 38.4–76.8)
Platelets: 240 10*3/uL (ref 145–400)
RBC: 4.27 10*6/uL (ref 3.70–5.45)
RDW: 14.1 % (ref 11.2–14.5)
WBC: 3.8 10*3/uL — ABNORMAL LOW (ref 3.9–10.3)
lymph#: 0.9 10*3/uL (ref 0.9–3.3)

## 2010-03-06 LAB — MAGNESIUM: Magnesium: 2 mg/dL (ref 1.5–2.5)

## 2010-03-09 ENCOUNTER — Telehealth: Payer: Self-pay | Admitting: Internal Medicine

## 2010-03-16 LAB — CBC WITH DIFFERENTIAL/PLATELET
BASO%: 0.4 % (ref 0.0–2.0)
Basophils Absolute: 0 10*3/uL (ref 0.0–0.1)
EOS%: 4.2 % (ref 0.0–7.0)
Eosinophils Absolute: 0.2 10*3/uL (ref 0.0–0.5)
HCT: 39 % (ref 34.8–46.6)
HGB: 13.5 g/dL (ref 11.6–15.9)
LYMPH%: 21.3 % (ref 14.0–49.7)
MCH: 29.5 pg (ref 25.1–34.0)
MCHC: 34.6 g/dL (ref 31.5–36.0)
MCV: 85.3 fL (ref 79.5–101.0)
MONO#: 0.4 10*3/uL (ref 0.1–0.9)
MONO%: 9.3 % (ref 0.0–14.0)
NEUT#: 2.9 10*3/uL (ref 1.5–6.5)
NEUT%: 64.8 % (ref 38.4–76.8)
Platelets: 206 10*3/uL (ref 145–400)
RBC: 4.57 10*6/uL (ref 3.70–5.45)
RDW: 13.7 % (ref 11.2–14.5)
WBC: 4.5 10*3/uL (ref 3.9–10.3)
lymph#: 1 10*3/uL (ref 0.9–3.3)
nRBC: 0 % (ref 0–0)

## 2010-03-20 ENCOUNTER — Encounter: Payer: Self-pay | Admitting: Internal Medicine

## 2010-03-20 LAB — COMPREHENSIVE METABOLIC PANEL
ALT: 23 U/L (ref 0–35)
AST: 13 U/L (ref 0–37)
Albumin: 4.2 g/dL (ref 3.5–5.2)
Alkaline Phosphatase: 96 U/L (ref 39–117)
BUN: 18 mg/dL (ref 6–23)
CO2: 26 mEq/L (ref 19–32)
Calcium: 9.8 mg/dL (ref 8.4–10.5)
Chloride: 99 mEq/L (ref 96–112)
Creatinine, Ser: 1.05 mg/dL (ref 0.40–1.20)
Glucose, Bld: 90 mg/dL (ref 70–99)
Potassium: 4.5 mEq/L (ref 3.5–5.3)
Sodium: 134 mEq/L — ABNORMAL LOW (ref 135–145)
Total Bilirubin: 0.4 mg/dL (ref 0.3–1.2)
Total Protein: 6.9 g/dL (ref 6.0–8.3)

## 2010-03-20 LAB — MAGNESIUM: Magnesium: 2.1 mg/dL (ref 1.5–2.5)

## 2010-03-21 ENCOUNTER — Encounter: Payer: Self-pay | Admitting: Internal Medicine

## 2010-03-22 ENCOUNTER — Encounter: Payer: Self-pay | Admitting: Gynecology

## 2010-03-23 LAB — CBC WITH DIFFERENTIAL/PLATELET
BASO%: 0.2 % (ref 0.0–2.0)
EOS%: 1.7 % (ref 0.0–7.0)
MCH: 30.5 pg (ref 25.1–34.0)
MCHC: 34.7 g/dL (ref 31.5–36.0)
RDW: 13.9 % (ref 11.2–14.5)
lymph#: 0.9 10*3/uL (ref 0.9–3.3)

## 2010-03-31 NOTE — Assessment & Plan Note (Signed)
Summary: 2 MOS F/U / #/CD   Vital Signs:  Patient profile:   60 year old female Height:      61.5 inches Weight:      222 pounds BMI:     41.42 O2 Sat:      97 % on Room air Temp:     98.0 degrees F oral Pulse rate:   125 / minute Pulse rhythm:   irregularly irregular Resp:     16 per minute BP sitting:   128 / 80  (left arm) Cuff size:   large  Vitals Entered By: Rock Nephew CMA (Jul 02, 2009 2:22 PM)  Nutrition Counseling: Patient's BMI is greater than 25 and therefore counseled on weight management options.  O2 Flow:  Room air CC: follow-up visit// bronchitis, URI symptoms Is Patient Diabetic? No Pain Assessment Patient in pain? no        Primary Care Provider:  Yetta Barre  CC:  follow-up visit// bronchitis and URI symptoms.  History of Present Illness:  URI Symptoms      This is a 60 year old woman who presents with URI symptoms.  The symptoms began 3 weeks ago.  The severity is described as mild.  The patient reports nasal congestion, purulent nasal discharge, sore throat, and productive cough, but denies earache and sick contacts.  The patient denies fever, stiff neck, dyspnea, wheezing, rash, vomiting, diarrhea, use of an antipyretic, and response to antipyretic.  The patient denies headache, muscle aches, and severe fatigue.  Risk factors for Strep sinusitis include unilateral facial pain, unilateral nasal discharge, poor response to decongestant, and double sickening.  The patient denies the following risk factors for Strep sinusitis: Strep exposure, tender adenopathy, and absence of cough.    Preventive Screening-Counseling & Management  Alcohol-Tobacco     Alcohol drinks/day: 0     Smoking Status: quit  Hep-HIV-STD-Contraception     Hepatitis Risk: no risk noted     HIV Risk: no risk noted     STD Risk: no risk noted      Sexual History:  currently monogamous.        Drug Use:  no.        Blood Transfusions:  no.    Medications Prior to Update: 1)   Multivitamins  Tabs (Multiple Vitamin) .... Take 1 By Mouth Qd 2)  Prilosec 20 Mg Cpdr (Omeprazole) .... Take 1 By Mouth Qd 3)  Digoxin 0.25 Mg Tabs (Digoxin) .... Take One Tablet By Mouth Daily 4)  Furosemide 20 Mg Tabs (Furosemide) .... As Needed 5)  Aspirin 81 Mg Tabs (Aspirin) .... Take 2 Tabs Once Daily 6)  Cardizem Cd 120 Mg Xr24h-Cap (Diltiazem Hcl Coated Beads) .... Take One Tablet in The Am and Take Two Tablets in The Pm 7)  Cipro 250 Mg Tabs (Ciprofloxacin Hcl) .... Two Times A Day 8)  Lamisil 250 Mg Tabs (Terbinafine Hcl) .... Take One Tablet By Mouth Once Daily.  Current Medications (verified): 1)  Multivitamins  Tabs (Multiple Vitamin) .... Take 1 By Mouth Qd 2)  Prilosec 20 Mg Cpdr (Omeprazole) .... Take 1 By Mouth Qd 3)  Digoxin 0.25 Mg Tabs (Digoxin) .... Take One Tablet By Mouth Daily 4)  Furosemide 20 Mg Tabs (Furosemide) .... As Needed 5)  Aspirin 81 Mg Tabs (Aspirin) .... Take 2 Tabs Once Daily 6)  Cardizem Cd 120 Mg Xr24h-Cap (Diltiazem Hcl Coated Beads) .... Take One Tablet in The Am and Take Two Tablets in The Pm 7)  Lamisil 250 Mg Tabs (Terbinafine Hcl) .... Take One Tablet By Mouth Once Daily. 8)  Cefuroxime Axetil 500 Mg Tabs (Cefuroxime Axetil) .... One By Mouth Two Times A Day For 10 Days  Allergies (verified): 1)  ! Codeine 2)  ! Morphine 3)  ! Pradaxa (Dabigatran Etexilate Mesylate)  Past History:  Past Medical History: Reviewed history from 04/21/2009 and no changes required. Cervical cancer, s/p chemotherapy 2009 Persistent Atrial fibrillation Depression Hypertension Anemia  Past Surgical History: Reviewed history from 08/01/2008 and no changes required. Hysterectomy Oophorectomy Cholecystectomy (0272) Urethal stent (2005 & 2010) Cardioversion (08/2007) Tonsillectomy (1969)  Family History: Reviewed history from 08/01/2008 and no changes required. Mother died of lung cancer, was a smoker.  Family hx also positive of hypertension, cardiac  disease, and CVA She has 2 sisters who are healthy.  Her son has asthma  Social History: Reviewed history from 04/21/2009 and no changes required. Former Smoker, quit 1986 Alcohol use-no Drug use-no lives with significant other is [place], [place]. 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  The patient denies anorexia, fever, weight loss, weight gain, decreased hearing, hoarseness, chest pain, syncope, dyspnea on exertion, peripheral edema, headaches, hemoptysis, abdominal pain, hematuria, suspicious skin lesions, enlarged lymph nodes, and angioedema.   CV:  Complains of palpitations; denies chest pain or discomfort, difficulty breathing at night, fainting, fatigue, leg cramps with exertion, lightheadness, near fainting, shortness of breath with exertion, and swelling of feet.  Physical Exam  General:  alert, well-developed, well-nourished, well-hydrated, appropriate dress, normal appearance, healthy-appearing, and cooperative to examination.   Head:  normocephalic, atraumatic, no abnormalities observed, and no abnormalities palpated.   Mouth:  Oral mucosa and oropharynx without lesions or exudates.  Teeth in good repair. Neck:  supple, full ROM, no masses, no thyromegaly, no thyroid nodules or tenderness, no JVD, normal carotid upstroke, no carotid bruits, no cervical lymphadenopathy, and no neck tenderness.   Lungs:  normal respiratory effort, no intercostal retractions, no accessory muscle use, normal breath sounds, no dullness, no fremitus, no crackles, and no wheezes.   Heart:  normal rate, regular rhythm, no murmur, no gallop, no rub, and no JVD.   Abdomen:  soft, non-tender, normal bowel sounds, no distention, no masses, no guarding, no rigidity, no rebound  tenderness, no hepatomegaly, and no splenomegaly.   Msk:  No deformity or scoliosis noted of thoracic or lumbar spine.   Pulses:  R and L carotid,radial,femoral,dorsalis pedis and posterior tibial pulses are full and equal bilaterally Extremities:  No clubbing, cyanosis, edema, or deformity noted with normal full range of motion of all joints.   Neurologic:  No cranial nerve deficits noted. Station and gait are normal. Plantar reflexes are down-going bilaterally. DTRs are symmetrical throughout. Sensory, motor and coordinative functions appear intact. Skin:  Intact without suspicious lesions or rashes Cervical Nodes:  no anterior cervical adenopathy and no posterior cervical adenopathy.   Axillary Nodes:  no R axillary adenopathy and no L axillary adenopathy.   Psych:  Cognition and judgment appear intact. Alert and cooperative with normal attention span and concentration. No apparent delusions, illusions, hallucinations   Impression & Recommendations:  Problem # 1:  SINUSITIS- ACUTE-NOS (ICD-461.9) Assessment New  The following medications were removed from  the medication list:    Cipro 250 Mg Tabs (Ciprofloxacin hcl) .[name]..[name] Two times a day Her updated medication list for this problem includes:    Cefuroxime Axetil 500 Mg Tabs (Cefuroxime axetil) ..... One by mouth two times a day for 10 days  Instructed on treatment. Call if symptoms persist or worsen.   Problem # 2:  ATRIAL FIBRILLATION (ICD-427.31) Assessment: Unchanged she tells me today that she will be seeing dr. Johney Frame soon for f/up. Her updated medication list for this problem includes:    Digoxin 0.25 Mg Tabs (Digoxin) .[name]... Take one tablet by mouth daily    Aspirin 81 Mg Tabs (Aspirin) .[name]... Take 2 tabs once daily    Cardizem Cd 120 Mg Xr24h-cap (Diltiazem hcl coated beads) .[name]... Take one tablet in the am and take two tablets in the pm  Complete Medication List: 1)  Multivitamins Tabs (Multiple vitamin) .... Take 1 by mouth  qd 2)  Prilosec 20 Mg Cpdr (Omeprazole) .... Take 1 by mouth qd 3)  Digoxin 0.25 Mg Tabs (Digoxin) .... Take one tablet by mouth daily 4)  Furosemide 20 Mg Tabs (Furosemide) .... As needed 5)  Aspirin 81 Mg Tabs (Aspirin) .... Take 2 tabs once daily 6)  Cardizem Cd 120 Mg Xr24h-cap (Diltiazem hcl coated beads) .... Take one tablet in the am and take two tablets in the pm 7)  Lamisil 250 Mg Tabs (Terbinafine hcl) .... Take one tablet by mouth once daily. 8)  Cefuroxime Axetil 500 Mg Tabs (Cefuroxime axetil) .... One by mouth two times a day for 10 days  Patient Instructions: 1)  Please schedule a follow-up appointment in 1 month. 2)  Take your antibiotic as prescribed until ALL of it is gone, but stop if you develop a rash or swelling and contact our office as soon as possible. 3)  Acute bronchitis symptoms for less than 10 days are not helped by antibiotics. take over the counter cough medications. call if no improvment in  5-7 days, sooner if increasing cough, fever, or new symptoms( shortness of breath, chest pain). Prescriptions: CEFUROXIME AXETIL 500 MG TABS (CEFUROXIME AXETIL) One by mouth two times a day for 10 days  #20 x 0   Entered and Authorized by:   Etta Grandchild MD   Signed by:   Etta Grandchild MD on 07/02/2009   Method used:   Electronically to        [facility] (retail)       [address]       [place], [place]  [id number]       Ph: [phone]       Fax: [phone]   RxID:   [id number]

## 2010-03-31 NOTE — Progress Notes (Signed)
Summary: stop pradaxa  Phone Note From Other Clinic   Caller: Nurse Claris Che Summary of Call: pt having dental work and is on Pradaxa does pt need to stop meds? ofc [phone] fax [phone] appt on the 24th Initial call taken by: Edman Circle,  October 13, 2009 3:12 PM  Follow-up for Phone Call        hold the Pradaxa 2 days prior and reastart the morning after per Dr Johney Frame Dennis Bast, RN, BSN  October 13, 2009 6:16 PM spoke with Claris Che gave directions and will fax Dennis Bast, RN, BSN  October 15, 2009 10:56 AM

## 2010-03-31 NOTE — Assessment & Plan Note (Signed)
Summary: eph/jml   Visit Type:  Follow-up Referring Young Mulvey:  Jama Flavors Primary Rochelle Nephew:  Yetta Barre   History of Present Illness: The patient presents today for routine electrophysiology followup. She reports doing reasonably well since initiating tikosyn.  She reports occasional epsodes of afib, but feels that she is in sinus most of the time.  Her primary concern today is that her recent cancer CT may have shown liver involvement.  She has a PET scan planned.   The patient denies symptoms of chest pain, shortness of breath, orthopnea, PND, lower extremity edema, dizziness, presyncope, syncope, or neurologic sequela. The patient is tolerating medications without difficulties and is otherwise without complaint today.   Current Medications (verified): 1)  Multivitamins  Tabs (Multiple Vitamin) .... Take 1 By Mouth Qd 2)  Prilosec 20 Mg Cpdr (Omeprazole) .... Take 1 By Mouth Qd 3)  Furosemide 20 Mg Tabs (Furosemide) .... As Needed 4)  Pradaxa 150 Mg Caps (Dabigatran Etexilate Mesylate) .... One By Mouth Two Times A Day 5)  Metoprolol Tartrate 50 Mg Tabs (Metoprolol Tartrate) .[name].. 1 1/2  By Mouth Two Times A Day 6)  Tikosyn 250 Mcg Caps (Dofetilide) .... Two Times A Day  Allergies: 1)  ! Codeine 2)  ! Morphine  Past History:  Past Medical History: Reviewed history from 04/21/2009 and no changes required. Cervical cancer, s/p chemotherapy 2009 Persistent Atrial fibrillation Depression Hypertension Anemia  Past Surgical History: Reviewed history from 08/01/2008 and no changes required. Hysterectomy Oophorectomy Cholecystectomy (0981) Urethal stent (2005 & 2010) Cardioversion (08/2007) Tonsillectomy (1969)  Social History: Reviewed history from 04/21/2009 and no changes required. Former Smoker, quit 1986 Alcohol use-no Drug use-no lives with significant other is [place], [place]. 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   Vital Signs:  Patient profile:   60 year old female Height:      61.5 inches Weight:      237 pounds BMI:     44.21 Pulse rate:   68 / minute BP sitting:   122 / 88  (left arm)  Vitals Entered By: Laurance Flatten CMA (January 26, 2010 10:22 AM)  Physical Exam  General:  Well developed, well nourished, in no acute distress. Head:  normocephalic and atraumatic Eyes:  PERRLA/EOM intact; conjunctiva and lids normal. Mouth:  Teeth, gums and palate normal. Oral mucosa normal. Neck:  Neck supple, no JVD. No masses, thyromegaly or abnormal cervical nodes. Lungs:  Clear bilaterally to auscultation and percussion. Heart:  Non-displaced PMI, chest non-tender; regular rate and rhythm, S1, S2 without murmurs, rubs or gallops. Carotid upstroke normal, no bruit. Normal abdominal aortic size, no bruits. Femorals normal pulses, no bruits. Pedals normal pulses. No edema, no varicosities. Abdomen:  Bowel sounds positive; abdomen soft and non-tender without masses, organomegaly, or hernias noted. No hepatosplenomegaly. Msk:  Back normal, normal gait. Muscle strength and tone normal. Pulses:  pulses normal in all 4 extremities Extremities:  No clubbing or cyanosis. Neurologic:  Alert and oriented x 3.   EKG  Procedure date:  01/26/2010  Findings:      sinus rhythm, Qtc 506  Impression & Recommendations:  Problem # 1:  ATRIAL FIBRILLATION (ICD-427.31)  stable continue current medical therapy we will check BMET, Mg, and TFTs today  Her updated medication list for this problem includes:    Metoprolol Tartrate 50 Mg Tabs (Metoprolol  tartrate) .[name]... 1 1/2  by mouth two times a day    Tikosyn 250 Mcg Caps (Dofetilide) .[name]..[name] Two times a day  Orders: TLB-BMP (Basic Metabolic Panel-BMET) (80048-METABOL) TLB-T3, Free (Triiodothyronine) (84481-T3FREE) TLB-T4 (Thyrox), Free [phone]) TLB-Magnesium (Mg) (83735-MG)  Problem # 2:   HYPERTENSION (ICD-401.9)  stable no changes  Her updated medication list for this problem includes:    Furosemide 20 Mg Tabs (Furosemide) .[name]... As needed    Metoprolol Tartrate 50 Mg Tabs (Metoprolol tartrate) .[name]... 1 1/2  by mouth two times a day  Patient Instructions: 1)  return in 3 months 2)  Your physician recommends that you continue on your current medications as directed. Please refer to the Current Medication list given to you today.

## 2010-03-31 NOTE — Assessment & Plan Note (Signed)
Summary: f/u 6 weeks   Visit Type:  Follow-up Referring Provider:  Jama Flavors Primary Provider:  Yetta Barre   History of Present Illness: The patient presents today for routine electrophysiology followup. Her palpitations and fatigue are stable.  Unfortunately, she reports rash and "hot flashes" due to diltiazem.  She requests that we switch her back to metoprolol, though she previously did not well tolerate this medicine due to fatigue.  The patient denies symptoms of  chest pain, shortness of breath, orthopnea, PND, lower extremity edema, dizziness, presyncope, syncope, or neurologic sequela. The patient is tolerating medications without difficulties and is otherwise without complaint today.   Current Medications (verified): 1)  Multivitamins  Tabs (Multiple Vitamin) .... Take 1 By Mouth Qd 2)  Prilosec 20 Mg Cpdr (Omeprazole) .... Take 1 By Mouth Qd 3)  Digoxin 0.25 Mg Tabs (Digoxin) .... Take One Tablet By Mouth Daily 4)  Furosemide 20 Mg Tabs (Furosemide) .... As Needed 5)  Aspirin 81 Mg Tabs (Aspirin) .... Take 2 Tabs Once Daily 6)  Diltiazem Hcl Er Beads 240 Mg Xr24h-Cap (Diltiazem Hcl Er Beads) .... One By Mouth Bid 7)  Lamisil 250 Mg Tabs (Terbinafine Hcl) .... Take One Tablet By Mouth Once Daily.  Allergies (verified): 1)  ! Codeine 2)  ! Morphine 3)  ! Pradaxa (Dabigatran Etexilate Mesylate)  Past History:  Past Medical History: Reviewed history from 04/21/2009 and no changes required. Cervical cancer, s/p chemotherapy 2009 Persistent Atrial fibrillation Depression Hypertension Anemia  Past Surgical History: Reviewed history from 08/01/2008 and no changes required. Hysterectomy Oophorectomy Cholecystectomy (4098) Urethal stent (2005 & 2010) Cardioversion (08/2007) Tonsillectomy (1969)  Social History: Reviewed history from 04/21/2009 and no changes required. Former Smoker, quit 1986 Alcohol use-no Drug use-no lives with significant other is [place],  . 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   Vital Signs:  Patient profile:   60 year old female Height:      61.5 inches Weight:      225 pounds BMI:     41.98 Pulse rate:   105 / minute BP sitting:   124 / 90  (left arm)  Vitals Entered By: Laurance Flatten CMA (August 21, 2009 3:27 PM)  Physical Exam  General:  obese, NAD Head:  normocephalic, atraumatic, no abnormalities observed, and no abnormalities palpated.   Eyes:  vision grossly intact and no injection.   Mouth:  Oral mucosa and oropharynx without lesions or exudates.  Teeth in good repair. Neck:  supple Lungs:  Clear bilaterally to auscultation and percussion. Heart:  tachy irregular rhythm, no m/r/g Abdomen:  Bowel sounds positive; abdomen soft and non-tender without masses, organomegaly, or hernias noted. No hepatosplenomegaly. Msk:  Back normal, normal gait. Muscle strength and tone normal. Pulses:  pulses normal in all 4 extremities Extremities:  No clubbing or cyanosis. Neurologic:  Alert and oriented x 3.  TE Echocardiogram  Procedure date:  09/20/2007  Findings:       SUMMARY   -  Left ventricular ejection fraction was estimated to be 60 %.   -  Aortic valve thickness was mildly increased. There was trivial         aortic valvular regurgitation.   -  There was mild atheroma of the descending aorta.   -  There was mild mitral valvular regurgitation.   -  The left atrium  was moderately dilated. There was no left atrial         appendage thrombus identified.   -  Normal pulmonic valve   -  The right atrium was moderately dilated.   -  There was a small pericardial effusion anterior to the heart.    Impression & Recommendations:  Problem # 1:  ATRIAL FIBRILLATION (ICD-427.31) The patient has difficult to control atrial fibrillation.  Unfortunately, her LA size is 52mm by prior echo and she has  failed to maintain sinus rhythm with Ic agents.  She has had labile INRs with coumadin and was therefore placed on pradaxa.  She had hematuria initially with pradaxa (after one dose) and stopped the medicine. We had a long discussion today regarding our therapeutic options for afib. We will again try pradaxa 150mg  two times a day.  If she is able to tolerate this medicine, then I would again like to persue sinus rhythm once she has been on it for 4 weeks.  I would favor tikosyn at this point for rhythm control. She wishes to stop diltiazem today given her rash and "hot flashes".  I am not sure that they are due to the medicine, but will switch her back to metoprolol at this time.  We will also continue digoxin for rate control.  Problem # 2:  HYPERTENSION (ICD-401.9) stable  Patient Instructions: 1)  Your physician recommends that you schedule a follow-up appointment in: 6 weeks with Dr Johney Frame  2)  Your physician has recommended you make the following change in your medication: stop Asa 81mg  and start Pradaxa 150mg  two times a day, and stop Diltiazem and start Metoprolol 50mg  two times a day Prescriptions: FUROSEMIDE 20 MG TABS (FUROSEMIDE) as needed  #30 x 3   Entered by:   Dennis Bast, RN, BSN   Authorized by:   Hillis Range, MD   Signed by:   Dennis Bast, RN, BSN on 08/21/2009   Method used:   Electronically to        [facility] (retail)       [address]       [place], [place]  [address]       Ph: [phone]       Fax: [phone]   RxID:   [id number] PRADAXA 150 MG CAPS (DABIGATRAN ETEXILATE MESYLATE) one by mouth two times a day  #60 x 3   Entered by:   Dennis Bast, RN, BSN   Authorized by:   Hillis Range, MD   Signed by:   Dennis Bast, RN, BSN on 08/21/2009   Method used:   Electronically to        [facility] (retail)       [address]       [place], [place]  [id number]       Ph:  [phone]       Fax: [phone]   RxID:   [id number] METOPROLOL TARTRATE 50 MG TABS (METOPROLOL TARTRATE) one by mouth two times a day  #60 x 6   Entered by:   Dennis Bast, RN, BSN   Authorized by:   Hillis Range, MD   Signed by:   Dennis Bast, RN, BSN on 08/21/2009   Method used:   Electronically to        [facility] (retail)       [address]  [address]       [place], [place]  [id number]       Ph: [phone]       Fax: [phone]   RxID:   [id number]

## 2010-03-31 NOTE — Assessment & Plan Note (Signed)
Summary: 2 WK ROV /NWS #   Vital Signs:  Patient profile:   60 year old female Height:      61.5 inches Weight:      221 pounds BMI:     41.23 O2 Sat:      97 % on Room air Temp:     98.5 degrees F oral Pulse rate:   112 / minute Pulse rhythm:   irregular Resp:     16 per minute BP sitting:   136 / 90  (left arm) Cuff size:   large  Vitals Entered By: Rock Nephew CMA (March 05, 2009 1:45 PM)  O2 Flow:  Room air  Primary Care Provider:  Newt Lukes MD   History of Present Illness: She returns for f/up and says that URI symptoms have resolved but she has had palpitations off/on for the last 2 weeks. She is occasionally taking Robitussin with phenylephrine.  Preventive Screening-Counseling & Management  Alcohol-Tobacco     Alcohol drinks/day: 0     Smoking Status: quit  Clinical Review Panels:  Diabetes Management   HgBA1C:  5.2 (09/19/2008)   Creatinine:  1.2 (09/19/2008)  CBC   WBC:  3.6 (09/19/2008)   RBC:  3.32 (09/19/2008)   Hgb:  11.3 (09/19/2008)   Hct:  32.8 (09/19/2008)   Platelets:  216.0 (09/19/2008)   MCV  98.6 (09/19/2008)   MCHC  34.5 (09/19/2008)   RDW  14.7 (09/19/2008)   PMN:  58.0 (09/19/2008)   Lymphs:  20.5 (09/19/2008)   Monos:  15.2 (09/19/2008)   Eosinophils:  5.2 (09/19/2008)   Basophil:  1.1 (09/19/2008)  Complete Metabolic Panel   Glucose:  145 (09/19/2008)   Sodium:  140 (09/19/2008)   Potassium:  3.7 (09/19/2008)   Chloride:  104 (09/19/2008)   CO2:  28 (09/19/2008)   BUN:  16 (09/19/2008)   Creatinine:  1.2 (09/19/2008)   Calcium:  9.7 (09/19/2008)   Current Medications (verified): 1)  Multivitamins  Tabs (Multiple Vitamin) .... Take 1 By Mouth Qd 2)  Prilosec 20 Mg Cpdr (Omeprazole) .... Take 1 By Mouth Qd 3)  Digoxin 0.25 Mg Tabs (Digoxin) .... Take One Tablet By Mouth Daily 4)  Furosemide 20 Mg Tabs (Furosemide) .... 2 By Mouth Once Daily 5)  Aspirin 81 Mg Tabs (Aspirin) .... Take 2 Tabs Once Daily 6)   Ibuprofen 200 Mg Tabs (Ibuprofen) .... Bid  Allergies (verified): 1)  ! Codeine 2)  ! Morphine  Past History:  Past Medical History: Reviewed history from 09/19/2008 and no changes required. Cervical cancer, hx of Anticoagulation therapy Atrial fibrillation Depression Hypertension Anemia-NOS  Past Surgical History: Reviewed history from 08/01/2008 and no changes required. Hysterectomy Oophorectomy Cholecystectomy (4270) Urethal stent (2005 & 2010) Cardioversion (08/2007) Tonsillectomy (1969)  Family History: Reviewed history from 08/01/2008 and no changes required. Mother died of lung cancer, was a smoker.  Family hx also positive of hypertension, cardiac disease, and CVA She has 2 sisters who are healthy.  Her son has asthma  Social History: Reviewed history from 08/01/2008 and no changes required. Former Smoker Alcohol use-no Drug use-no lives with female partner - has grown son and 2 g-sons  Review of Systems  The patient denies anorexia, fever, weight loss, weight gain, chest pain, prolonged cough, headaches, hemoptysis, abdominal pain, hematuria, difficulty walking, depression, enlarged lymph nodes, and angioedema.   CV:  Complains of palpitations; denies chest pain or discomfort, difficulty breathing at night, fainting, fatigue, leg cramps with exertion, lightheadness, near  fainting, shortness of breath with exertion, swelling of feet, and weight gain.  Physical Exam  General:  alert, well-developed, well-nourished, well-hydrated, appropriate dress, normal appearance, healthy-appearing, cooperative to examination, good hygiene, and overweight-appearing.   Mouth:  good dentition, no gingival abnormalities, no dental plaque, pharynx pink and moist, no erythema, no exudates, and no posterior lymphoid hypertrophy.   Neck:  supple, full ROM, no masses, no thyromegaly, and no JVD.   Lungs:  normal respiratory effort, no intercostal retractions, no accessory muscle use,  normal breath sounds, no dullness, no fremitus, no crackles, and no wheezes.   Heart:  no murmur, no gallop, no rub, no JVD, tachycardia, and irregular rhythm.   Abdomen:  soft, non-tender, and normal bowel sounds.   Pulses:  R and L carotid,radial,femoral,dorsalis pedis and posterior tibial pulses are full and equal bilaterally Extremities:  No clubbing, cyanosis, edema, or deformity noted with normal full range of motion of all joints.   Neurologic:  No cranial nerve deficits noted. Station and gait are normal. Plantar reflexes are down-going bilaterally. DTRs are symmetrical throughout. Sensory, motor and coordinative functions appear intact. Skin:  Intact without suspicious lesions or rashes Cervical Nodes:  No lymphadenopathy noted Psych:  Cognition and judgment appear intact. Alert and cooperative with normal attention span and concentration. No apparent delusions, illusions, hallucinations   Impression & Recommendations:  Problem # 1:  BRONCHITIS-ACUTE (ICD-466.0) Assessment Improved  The following medications were removed from the medication list:    Robitussin Cough/cold Cf 5-10-100 Mg/49ml Liqd (Phenylephrine-dm-gg) ..... By mouth q 3-4 hours    Promethazine-dm 6.25-15 Mg/23ml Syrp (Promethazine-dm) .[name]... 5-10 ml by mouth qid as needed fpr cough  Problem # 2:  ATRIAL FIBRILLATION (ICD-427.31) Assessment: Deteriorated will anticoagulate with Pradaxa and refer back to Dr. Clide Cliff for f/up. Her updated medication list for this problem includes:    Digoxin 0.25 Mg Tabs (Digoxin) .[name]... Take one tablet by mouth daily    Aspirin 81 Mg Tabs (Aspirin) .[name]... Take 2 tabs once daily  Orders: Cardiology Referral (Cardiology)  Problem # 3:  HYPERTENSION (ICD-401.9) Assessment: Unchanged  Her updated medication list for this problem includes:    Furosemide 20 Mg Tabs (Furosemide) .[name]... 2 by mouth once daily  BP today: 136/90 Prior BP: 130/88 (02/20/2009)  Labs Reviewed: K+: 3.7  (09/19/2008) Creat: : 1.2 (09/19/2008)     Problem # 4:  ANTICOAGULATION THERAPY (ICD-V58.61) Assessment: Unchanged  Orders: Cardiology Referral (Cardiology)  Complete Medication List: 1)  Multivitamins Tabs (Multiple vitamin) .... Take 1 by mouth qd 2)  Prilosec 20 Mg Cpdr (Omeprazole) .... Take 1 by mouth qd 3)  Digoxin 0.25 Mg Tabs (Digoxin) .... Take one tablet by mouth daily 4)  Furosemide 20 Mg Tabs (Furosemide) .... 2 by mouth once daily 5)  Aspirin 81 Mg Tabs (Aspirin) .... Take 2 tabs once daily 6)  Ibuprofen 200 Mg Tabs (Ibuprofen) .... Bid 7)  Pradaxa 150 Mg  .... One by mouth twice a day for a. fib  Patient Instructions: 1)  Please schedule a follow-up appointment in 1 month. Prescriptions: PRADAXA 150 MG One by mouth twice a day for a. fib  #24 x 0   Entered and Authorized by:   Etta Grandchild MD   Signed by:   Etta Grandchild MD on 03/05/2009   Method used:   Samples Given   RxID:   [id number]

## 2010-03-31 NOTE — Progress Notes (Signed)
Summary:  Second Opinion  Phone Note Outgoing Call   Call placed by: Duncan Dull, RN, BSN,  April 10, 2009 12:36 PM Call placed to: Patient Summary of Call: I received a flag from [facility] who got a flag from [facility] at Dr. Yetta Barre office. Per Dr. Yetta Barre the pt wants a second opinion (not certain why or what about). I called the pt, just to get more information. NO answer. Left message on her machine to call me back. Duncan Dull, RN, BSN  April 10, 2009 12:37 PM   Follow-up for Phone Call        Never heard from pt. I see that she has already seen Dr. Johney Frame. I will close this note Follow-up by: Duncan Dull, RN, BSN,  April 21, 2009 2:12 PM

## 2010-03-31 NOTE — Progress Notes (Signed)
  Phone Note From Other Clinic   Caller: Angelique Blonder Details for Reason: Pt.Information Initial call taken by: KM    Faxed all Cardiac over to [facility] to fax [phone] [facility]  May 05, 2009 8:53 AM

## 2010-03-31 NOTE — Progress Notes (Signed)
Summary: heart racing and questions about Tikosyn  Phone Note Call from Patient Call back at Work Phone [phone]   Caller: Patient Summary of Call: Pt having heart racing and question about medication Tikosyn Initial call taken by: Judie Grieve,  December 26, 2009 9:38 AM  Follow-up for Phone Call        [name] woke up at 3am with her "heart racing and I am back in atrial fib".  She was recently in the hospital for Tikosyn loading and did convert to SR last Sunday morning according to patient.  Her heart rate now is anywhere from 90-125 and irregular.  She is not short of breath and is not having any pain.  Her metorpolol Tartrate was decreased while in the hospital from 75 mg to 50 mg.  She took her meds at 6:30am  at 8:00am her bp was 138/96 and later 114/72.  Atrial fib is not new according to patient and she was wondering if she should continue the Tikosyn.  I will send to dod for review. Mylo Red RN  Additional Follow-up for Phone Call Additional follow up Details #1::        Spoke with pt on phone. She feels ok except for palps. Advised she will likely have less AF now that she's on tikosyn. She can take an additional metoprolol this pm, up to three times a day if needed. Will ask to move appt up with Dr Johney Frame. Additional Follow-up by: Norva Karvonen, MD,  December 26, 2009 6:38 PM

## 2010-03-31 NOTE — Progress Notes (Signed)
----   Converted from flag ---- ---- 03/07/2009 12:50 PM, Edman Circle wrote: appt 1/19 @ 9:00 with klein  ---- 03/07/2009 11:31 AM, Dagoberto Reef wrote: Thanks ------------------------------

## 2010-03-31 NOTE — Consult Note (Signed)
Summary: [facility]  [facility]   Imported By: Sherian Rein 12/10/2009 09:08:12  _____________________________________________________________________  External Attachment:    Type:   Image     Comment:   External Document

## 2010-03-31 NOTE — Assessment & Plan Note (Signed)
Summary: 4wk f/u  ok per kelly/sl   Visit Type:  Follow-up Referring Provider:  Jama Flavors Primary Provider:  Yetta Barre   History of Present Illness: The patient presents today for routine electrophysiology followup. Her palpitations and fatigue are stable. She is tolerating pradaxa without bleeding.  The patient denies symptoms of  chest pain, shortness of breath, orthopnea, PND, lower extremity edema, dizziness, presyncope, syncope, or neurologic sequela. The patient is tolerating medications without difficulties and is otherwise without complaint today.   Current Medications (verified): 1)  Multivitamins  Tabs (Multiple Vitamin) .... Take 1 By Mouth Qd 2)  Prilosec 20 Mg Cpdr (Omeprazole) .... Take 1 By Mouth Qd 3)  Furosemide 20 Mg Tabs (Furosemide) .... As Needed 4)  Pradaxa 150 Mg Caps (Dabigatran Etexilate Mesylate) .... One By Mouth Two Times A Day 5)  Metoprolol Tartrate 50 Mg Tabs (Metoprolol Tartrate) .[name].. 1 1/2  By Mouth Two Times A Day  Allergies (verified): 1)  ! Codeine 2)  ! Morphine  Past History:  Past Medical History: Reviewed history from 04/21/2009 and no changes required. Cervical cancer, s/p chemotherapy 2009 Persistent Atrial fibrillation Depression Hypertension Anemia  Past Surgical History: Reviewed history from 08/01/2008 and no changes required. Hysterectomy Oophorectomy Cholecystectomy (1610) Urethal stent (2005 & 2010) Cardioversion (08/2007) Tonsillectomy (1969)  Family History: Reviewed history from 08/01/2008 and no changes required. Mother died of lung cancer, was a smoker.  Family hx also positive of hypertension, cardiac disease, and CVA She has 2 sisters who are healthy.  Her son has asthma  Social History: Reviewed history from 04/21/2009 and no changes required. Former Smoker, quit 1986 Alcohol use-no Drug use-no lives with significant other is [place], [place]. 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   Vital Signs:  Patient profile:   60 year old female Height:      61.5 inches Weight:      230 pounds BMI:     42.91 Pulse rate:   88 / minute BP sitting:   130 / 82  (left arm) Cuff size:   large  Vitals Entered By: Laurance Flatten CMA (December 12, 2009 10:44 AM)  Physical Exam  General:  obese, NAD Head:  normocephalic, atraumatic, no abnormalities observed, and no abnormalities palpated.   Eyes:  vision grossly intact and no injection.   Mouth:  Oral mucosa and oropharynx without lesions or exudates.  Teeth in good repair. Neck:  Neck supple, no JVD. No masses, thyromegaly or abnormal cervical nodes. Lungs:  Clear bilaterally to auscultation and percussion. Heart:  tachy irregular rhythm, no m/r/g Abdomen:  Bowel sounds positive; abdomen soft and non-tender without masses, organomegaly, or hernias noted. No hepatosplenomegaly. Msk:  Back normal, normal gait. Muscle strength and tone normal. Pulses:  pulses normal in all 4 extremities Extremities:  No clubbing or cyanosis. Neurologic:  Alert and oriented x 3.   EKG  Procedure date:  12/14/2009  Findings:      afib,  V rate 88 bpm, QT 368, QTC 420  Impression & Recommendations:  Problem # 1:  ATRIAL FIBRILLATION (ICD-427.31) The patient has difficult to control atrial fibrillation.  Unfortunately, her LA size is 52mm by prior echo and she has failed to maintain sinus rhythm with Ic agents.  She is tolerating pradaxa.  We will proceed with tikosyn load as she is now therapeutic  on pradaxa for > 4 weeks.  Risks/ benefits/ alternatives to tikosyn were discussed at length with the patient who wishes to proceed.  She will be admited to initiate tikosyn at the next available time.  Problem # 2:  HYPERTENSION (ICD-401.9) stable  Problem # 3:  EDEMA (ICD-782.3) stable salt restriction  Other Orders: EKG w/ Interpretation  (93000)

## 2010-03-31 NOTE — Assessment & Plan Note (Signed)
Summary: PER CHECK OUT/SF   Visit Type:  Follow-up Referring Provider:  Jama Flavors Primary Provider:  Yetta Barre   History of Present Illness: The patient presents today for routine electrophysiology followup. Her palpitations and fatigue are stable.  The patient denies symptoms of  chest pain, shortness of breath, orthopnea, PND, lower extremity edema, dizziness, presyncope, syncope, or neurologic sequela. The patient is tolerating medications without difficulties and is otherwise without complaint today.   Current Medications (verified): 1)  Multivitamins  Tabs (Multiple Vitamin) .... Take 1 By Mouth Qd 2)  Prilosec 20 Mg Cpdr (Omeprazole) .... Take 1 By Mouth Qd 3)  Digoxin 0.25 Mg Tabs (Digoxin) .... Take One Tablet By Mouth Daily 4)  Furosemide 20 Mg Tabs (Furosemide) .... As Needed 5)  Aspirin 81 Mg Tabs (Aspirin) .... Take 2 Tabs Once Daily 6)  Cardizem Cd 120 Mg Xr24h-Cap (Diltiazem Hcl Coated Beads) .... Take One Tablet in The Am and Take Two Tablets in The Pm 7)  Lamisil 250 Mg Tabs (Terbinafine Hcl) .... Take One Tablet By Mouth Once Daily. 8)  Cefuroxime Axetil 500 Mg Tabs (Cefuroxime Axetil) .... One By Mouth Two Times A Day For 10 Days  Allergies (verified): 1)  ! Codeine 2)  ! Morphine 3)  ! Pradaxa (Dabigatran Etexilate Mesylate)  Past History:  Past Medical History: Reviewed history from 04/21/2009 and no changes required. Cervical cancer, s/p chemotherapy 2009 Persistent Atrial fibrillation Depression Hypertension Anemia  Past Surgical History: Reviewed history from 08/01/2008 and no changes required. Hysterectomy Oophorectomy Cholecystectomy (1610) Urethal stent (2005 & 2010) Cardioversion (08/2007) Tonsillectomy (1969)  Social History: Reviewed history from 04/21/2009 and no changes required. Former Smoker, quit 1986 Alcohol use-no Drug use-no lives with significant other is [place], [place]. 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   Vital Signs:  Patient profile:   60 year old female Height:      61.5 inches Weight:      217 pounds BMI:     40.48 Pulse rate:   110 / minute Pulse rhythm:   irregular BP sitting:   130 / 90  (left arm)  Vitals Entered By: Laurance Flatten CMA (Jul 10, 2009 3:31 PM)  Physical Exam  General:  Well developed, well nourished, in no acute distress. Head:  normocephalic and atraumatic Eyes:  PERRLA/EOM intact; conjunctiva and lids normal. Mouth:  Teeth, gums and palate normal. Oral mucosa normal. Neck:  Neck supple, no JVD. No masses, thyromegaly or abnormal cervical nodes. Lungs:  Clear bilaterally to auscultation and percussion. Heart:  tachy irregular rhythm, no m/r/g Abdomen:  Bowel sounds positive; abdomen soft and non-tender without masses, organomegaly, or hernias noted. No hepatosplenomegaly. Msk:  Back normal, normal gait. Muscle strength and tone normal. Pulses:  pulses normal in all 4 extremities Extremities:  No clubbing or cyanosis. Neurologic:  Alert and oriented x 3.   EKG  Procedure date:  07/10/2009  Findings:      afib,  V rates 110s, otherwise normal ekg  Impression & Recommendations:  Problem # 1:  ATRIAL FIBRILLATION (ICD-427.31) We will increase cardizem to 240mg  two times a day today for better rate control. She will consider retrying pradaxa.  Once on anticoagulation, we will consider initiation of multaq for better rate control. She has CT scans planned for the next few weeks to further assess her cancer.  This will help in our decision of anticoagulation and also further strategies for afib.  Patient Instructions: 1)  Your physician recommends that you schedule a follow-up appointment in: 6 weeks with Dr Johney Frame 2)  Your physician has recommended you make the following change in your medication: increase Diltiazem to 240mg  two times a  day Prescriptions: DILTIAZEM HCL ER BEADS 240 MG XR24H-CAP (DILTIAZEM HCL ER BEADS) one by mouth bid  #180 x 3   Entered by:   Dennis Bast, RN, BSN   Authorized by:   Hillis Range, MD   Signed by:   Dennis Bast, RN, BSN on 07/10/2009   Method used:   Electronically to        [facility]* (mail-order)             ,          Ph: [phone]       Fax: [phone]   RxID:   [id number]

## 2010-03-31 NOTE — Procedures (Signed)
Summary: Colonoscopy  Patient: [name] Note: All result statuses are Final unless otherwise noted.  Tests: (1) Colonoscopy (COL)   COL Colonoscopy           DONE     [facility]     [address]     [place], [place]  [address]           COLONOSCOPY PROCEDURE REPORT           PATIENT:  [name], [name]  MR#:  [id number]     BIRTHDATE:  May 25, 1950, 59 yrs. old  GENDER:  female     ENDOSCOPIST:  Hedwig Morton. Juanda Chance, MD     REF. BY:  Etta Grandchild, M.D.     PROCEDURE DATE:  05/26/2009     PROCEDURE:  Colonoscopy 19147     ASA CLASS:  Class I     INDICATIONS:  Routine Risk Screening     MEDICATIONS:   Versed 10 mg, Fentanyl 100 mcg           DESCRIPTION OF PROCEDURE:   After the risks benefits and     alternatives of the procedure were thoroughly explained, informed     consent was obtained.  Digital rectal exam was performed and     revealed no rectal masses.   The LB [id number] endoscope     was introduced through the anus and advanced to the cecum, which     was identified by both the appendix and ileocecal valve, without     limitations.  The quality of the prep was poor, using MiraLax.     The instrument was then slowly withdrawn as the colon was fully     examined.     <<PROCEDUREIMAGES>>           FINDINGS:  Mild diverticulosis was found (see image3 and image4).     narrow tortuous left colon at 30 cm, difficult to negotiate  This     was otherwise a normal examination of the colon (see image5,     image2, and image1).   Retroflexed views in the rectum revealed no     abnormalities.    The scope was then withdrawn from the patient     and the procedure completed.           COMPLICATIONS:  None     ENDOSCOPIC IMPRESSION:     1) Mild diverticulosis     2) Otherwise normal examination     poor prep,     difficult exam through narrow sigmoid colon     RECOMMENDATIONS:     1) high fiber diet     REPEAT EXAM:  In 5 year(s) for.  5 yeat recall due to poor  prep           ______________________________     Hedwig Morton. Juanda Chance, MD           CC:           n.     eSIGNED:   Hedwig Morton. Daeveon Zweber at 05/26/2009 09:37 AM           Leota Sauers, [id number]  Note: An exclamation mark (!) indicates a result that was not dispersed into the flowsheet. Document Creation Date: 05/26/2009 9:37 AM _______________________________________________________________________  (1) Order result status: Final Collection or observation date-time: 05/26/2009 09:30 Requested date-time:  Receipt date-time:  Reported date-time:  Referring Physician:   Ordering Physician: Lina Sar [phone]) Specimen Source:  Source: Kem Parkinson  Filler Order Number: [id number] Lab site:   Appended Document: Colonoscopy    Clinical Lists Changes  Observations: Added new observation of COLONNXTDUE: 04/2014 (05/26/2009 12:54)

## 2010-03-31 NOTE — Miscellaneous (Signed)
Summary: [facility] Previsit/prep  Clinical Lists Changes  Medications: Added new medication of DULCOLAX 5 MG  TBEC (BISACODYL) Day before procedure take 2 at 3pm and 2 at 8pm. - Signed Added new medication of METOCLOPRAMIDE HCL 10 MG  TABS (METOCLOPRAMIDE HCL) As per prep instructions. - Signed Added new medication of MIRALAX   POWD (POLYETHYLENE GLYCOL 3350) As per prep  instructions. - Signed Rx of DULCOLAX 5 MG  TBEC (BISACODYL) Day before procedure take 2 at 3pm and 2 at 8pm.;  #4 x 0;  Signed;  Entered by: Wyona Almas RN;  Authorized by: Hart Carwin MD;  Method used: Electronically to [facility] [address] [place], [place], [place]  [id number], Ph: [phone], Fax: [phone] Rx of METOCLOPRAMIDE HCL 10 MG  TABS (METOCLOPRAMIDE HCL) As per prep instructions.;  #2 x 0;  Signed;  Entered by: Wyona Almas RN;  Authorized by: Hart Carwin MD;  Method used: Electronically to [facility] [address] [place], [place], [place]  [id number], Ph: [phone], Fax: [phone] Rx of MIRALAX   POWD (POLYETHYLENE GLYCOL 3350) As per prep  instructions.;  #255gm x 0;  Signed;  Entered by: Wyona Almas RN;  Authorized by: Hart Carwin MD;  Method used: Electronically to [facility] [address] [place], [place], [place]  [id number], Ph: [phone], Fax: [phone] Observations: Added new observation of ALLERGY REV: Done (05/14/2009 15:24)    Prescriptions: MIRALAX   POWD (POLYETHYLENE GLYCOL 3350) As per prep  instructions.  #255gm x 0   Entered by:   Wyona Almas RN   Authorized by:   Hart Carwin MD   Signed by:   Wyona Almas RN on 05/14/2009   Method used:   Electronically to        [facility] (retail)       [address]       [place], [place]  [id number]       Ph: [phone]       Fax: [phone]   RxID:   [id number] METOCLOPRAMIDE HCL 10 MG   TABS (METOCLOPRAMIDE HCL) As per prep instructions.  #2 x 0   Entered by:   Wyona Almas RN   Authorized by:   Hart Carwin MD   Signed by:   Wyona Almas RN on 05/14/2009   Method used:   Electronically to        [facility] (retail)       [address]       [place], [place]  [id number]       Ph: [phone]       Fax: [phone]   RxID:   [id number] DULCOLAX 5 MG  TBEC (BISACODYL) Day before procedure take 2 at 3pm and 2 at 8pm.  #4 x 0   Entered by:   Wyona Almas RN   Authorized by:   Hart Carwin MD   Signed by:   Wyona Almas RN on 05/14/2009   Method used:   Electronically to        [facility] (retail)       [address]       [place], [place]  [id number]       Ph: [phone]  Fax: [phone]   RxID:   [id number]

## 2010-03-31 NOTE — Assessment & Plan Note (Signed)
Summary: PER CHECK OUT/SF   Visit Type:  Follow-up Referring Provider:  Jama Flavors Primary Provider:  Yetta Barre   History of Present Illness: The patient presents today for routine electrophysiology followup. Her palpitations and fatigue are stable. She is tolerating pradaxa without bleeding.  The patient denies symptoms of  chest pain, shortness of breath, orthopnea, PND, lower extremity edema, dizziness, presyncope, syncope, or neurologic sequela. The patient is tolerating medications without difficulties and is otherwise without complaint today.   Current Medications (verified): 1)  Multivitamins  Tabs (Multiple Vitamin) .... Take 1 By Mouth Qd 2)  Prilosec 20 Mg Cpdr (Omeprazole) .... Take 1 By Mouth Qd 3)  Furosemide 20 Mg Tabs (Furosemide) .... As Needed 4)  Pradaxa 150 Mg Caps (Dabigatran Etexilate Mesylate) .... One By Mouth Two Times A Day 5)  Metoprolol Tartrate 50 Mg Tabs (Metoprolol Tartrate) .... One By Mouth Two Times A Day  Allergies: 1)  ! Codeine 2)  ! Morphine  Past History:  Past Medical History: Reviewed history from 04/21/2009 and no changes required. Cervical cancer, s/p chemotherapy 2009 Persistent Atrial fibrillation Depression Hypertension Anemia  Past Surgical History: Reviewed history from 08/01/2008 and no changes required. Hysterectomy Oophorectomy Cholecystectomy (9323) Urethal stent (2005 & 2010) Cardioversion (08/2007) Tonsillectomy (1969)  Social History: Reviewed history from 04/21/2009 and no changes required. Former Smoker, quit 1986 Alcohol use-no Drug use-no lives with significant other is [place], [place]. 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   Vital Signs:  Patient profile:   60 year old female Height:      61.5 inches Weight:      226 pounds Pulse rate:   101 / minute BP sitting:   124 / 76  (left  arm)  Vitals Entered By: Laurance Flatten CMA (October 20, 2009 4:25 PM)  Physical Exam  General:  obese, NAD Head:  normocephalic, atraumatic, no abnormalities observed, and no abnormalities palpated.   Eyes:  vision grossly intact and no injection.   Mouth:  Oral mucosa and oropharynx without lesions or exudates.  Teeth in good repair. Neck:  Neck supple, no JVD. No masses, thyromegaly or abnormal cervical nodes. Lungs:  Clear bilaterally to auscultation and percussion. Heart:  tachy irregular rhythm, no m/r/g Abdomen:  Bowel sounds positive; abdomen soft and non-tender without masses, organomegaly, or hernias noted. No hepatosplenomegaly. Msk:  Back normal, normal gait. Muscle strength and tone normal. Pulses:  pulses normal in all 4 extremities Extremities:  No clubbing or cyanosis. Neurologic:  Alert and oriented x 3.   EKG  Procedure date:  10/20/2009  Findings:      afib, V rate 101 bpm,  qt 360,  otherwise normal ekg,  Impression & Recommendations:  Problem # 1:  ATRIAL FIBRILLATION (ICD-427.31)  The patient has difficult to control atrial fibrillation.  Unfortunately, her LA size is 52mm by prior echo and she has failed to maintain sinus rhythm with Ic agents.  She is tolerating pradaxa.  She has not stop pradaxa this week for a planned dental procedure.  Once she has restarted pradaxa, I would again like to persue sinus rhythm once she has been on it for 4 weeks.  I would favor tikosyn at this point for rhythm control. I have increased metoprolol to 75mg  two times a day for rate control today.  Problem # 2:  HYPERTENSION (ICD-401.9)  stable  Patient Instructions: 1)  Your physician recommends that you schedule a follow-up appointment in: 4 weeks with Dr Johney Frame 2)  Your physician has recommended you make the following change in your medication: Increase Metoprolol to 75mg  two times a day Prescriptions: METOPROLOL TARTRATE 50 MG TABS (METOPROLOL TARTRATE) 1 1/2  by mouth two  times a day  #90 x 6   Entered by:   Dennis Bast, RN, BSN   Authorized by:   Hillis Range, MD   Signed by:   Dennis Bast, RN, BSN on 10/20/2009   Method used:   Electronically to        [facility] (retail)       [address]       [place], [place]  [id number]       Ph: [phone]       Fax: [phone]   RxID:   [id number]

## 2010-03-31 NOTE — Progress Notes (Signed)
----   Converted from flag ---- ---- 04/10/2009 3:36 PM, Edman Circle wrote: appt 2/21 @ 11:30 with allred ------------------------------

## 2010-03-31 NOTE — Assessment & Plan Note (Signed)
Summary: 1 MO ROV /NWS  #   Vital Signs:  Patient profile:   60 year old female Height:      61.5 inches Weight:      218 pounds BMI:     40.67 O2 Sat:      97 % on Room air Temp:     97.6 degrees F oral Pulse rate:   79 / minute Pulse rhythm:   irregularly irregular Resp:     16 per minute BP sitting:   128 / 86  (left arm) Cuff size:   large  Vitals Entered By: Ami Bullins CMA (April 09, 2009 3:20 PM)  O2 Flow:  Room air CC: follow-up visit, ab, Preventive Care, Hypertension Management   Primary Care Provider:  Newt Lukes MD  CC:  follow-up visit, ab, Preventive Care, and Hypertension Management.  History of Present Illness: She returns for f/up and remains frustrated that her  heart rate is still high (90-100) at rest and irregular, she started Cardizem 3 days ago and has tolerated it well.  Hypertension History:      She complains of palpitations, dyspnea with exertion, and peripheral edema, but denies headache, chest pain, orthopnea, visual symptoms, neurologic problems, syncope, and side effects from treatment.  She notes no problems with any antihypertensive medication side effects.        Positive major cardiovascular risk factors include female age 9 years old or older and hypertension.  Negative major cardiovascular risk factors include no history of diabetes or hyperlipidemia, negative family history for ischemic heart disease, and non-tobacco-user status.        Further assessment for target organ damage reveals no history of ASHD, cardiac end-organ damage (CHF/LVH), stroke/TIA, peripheral vascular disease, renal insufficiency, or hypertensive retinopathy.     Current Medications (verified): 1)  Multivitamins  Tabs (Multiple Vitamin) .... Take 1 By Mouth Qd 2)  Prilosec 20 Mg Cpdr (Omeprazole) .... Take 1 By Mouth Qd 3)  Digoxin 0.25 Mg Tabs (Digoxin) .... Take One Tablet By Mouth Daily 4)  Furosemide 20 Mg Tabs (Furosemide) .... As Needed 5)  Aspirin  81 Mg Tabs (Aspirin) .... Take 2 Tabs Once Daily 6)  Cardizem Cd 120 Mg Xr24h-Cap (Diltiazem Hcl Coated Beads) .... Once Daily  Allergies (verified): 1)  ! Codeine 2)  ! Morphine  Past History:  Past Medical History: Reviewed history from 09/19/2008 and no changes required. Cervical cancer, hx of Anticoagulation therapy Atrial fibrillation Depression Hypertension Anemia-NOS  Past Surgical History: Reviewed history from 08/01/2008 and no changes required. Hysterectomy Oophorectomy Cholecystectomy (1610) Urethal stent (2005 & 2010) Cardioversion (08/2007) Tonsillectomy (1969)  Family History: Reviewed history from 08/01/2008 and no changes required. Mother died of lung cancer, was a smoker.  Family hx also positive of hypertension, cardiac disease, and CVA She has 2 sisters who are healthy.  Her son has asthma  Social History: Reviewed history from 08/01/2008 and no changes required. Former Smoker Alcohol use-no Drug use-no lives with female partner - has grown son and 2 g-sons  Review of Systems  The patient denies anorexia, fever, weight loss, weight gain, prolonged cough, abdominal pain, and hematuria.    Physical Exam  General:  alert, well-developed, well-nourished, well-hydrated, appropriate dress, normal appearance, healthy-appearing, cooperative to examination, good hygiene, and overweight-appearing.   Head:  normocephalic and atraumatic.   Mouth:  good dentition, no gingival abnormalities, no dental plaque, pharynx pink and moist, no erythema, no exudates, and no posterior lymphoid hypertrophy.   Neck:  supple, full ROM, no masses, no thyromegaly, and no JVD.   Lungs:  normal respiratory effort, no intercostal retractions, no accessory muscle use, normal breath sounds, no dullness, no fremitus, no crackles, and no wheezes.   Heart:  no murmur, no gallop, no rub, no JVD, and irregular rhythm.   Abdomen:  soft, non-tender, and normal bowel sounds.   Msk:  no  joint tenderness and no joint swelling.   Pulses:  R and L carotid,radial,femoral,dorsalis pedis and posterior tibial pulses are full and equal bilaterally Extremities:  No clubbing, cyanosis, edema, or deformity noted with normal full range of motion of all joints.   Neurologic:  No cranial nerve deficits noted. Station and gait are normal. Plantar reflexes are down-going bilaterally. DTRs are symmetrical throughout. Sensory, motor and coordinative functions appear intact. Skin:  Intact without suspicious lesions or rashes Cervical Nodes:  No lymphadenopathy noted Psych:  Cognition and judgment appear intact. Alert and cooperative with normal attention span and concentration. No apparent delusions, illusions, hallucinations   Impression & Recommendations:  Problem # 1:  ATRIAL FIBRILLATION (ICD-427.31) Assessment Unchanged  Her updated medication list for this problem includes:    Digoxin 0.25 Mg Tabs (Digoxin) .[name]... Take one tablet by mouth daily    Aspirin 81 Mg Tabs (Aspirin) .[name]... Take 2 tabs once daily    Cardizem Cd 120 Mg Xr24h-cap (Diltiazem hcl coated beads) ..... Once daily  Orders: Cardiology Referral (Cardiology)  Problem # 2:  ROUTINE GENERAL MEDICAL EXAM@HEALTH  CARE FACL (ICD-V70.0) Assessment: New  Orders: Radiology Referral (Radiology) Gastroenterology Referral (GI)  Mammogram: Normal (03/01/2004) Pap smear: Normal (03/21/2009) Td Booster: Historical (05/31/2002)   Flu Vax: Historical (12/02/2008)   TSH: 1.28 (09/19/2008)   HgbA1C: 5.2 (09/19/2008)    Discussed using sunscreen, use of alcohol, drug use, self breast exam, routine dental care, routine eye care, schedule for GYN exam, routine physical exam, seat belts, multiple vitamins, osteoporosis prevention, adequate calcium intake in diet, recommendations for immunizations, mammograms and Pap smears.  Discussed exercise and checking cholesterol.    Complete Medication List: 1)  Multivitamins Tabs (Multiple  vitamin) .... Take 1 by mouth qd 2)  Prilosec 20 Mg Cpdr (Omeprazole) .... Take 1 by mouth qd 3)  Digoxin 0.25 Mg Tabs (Digoxin) .... Take one tablet by mouth daily 4)  Furosemide 20 Mg Tabs (Furosemide) .... As needed 5)  Aspirin 81 Mg Tabs (Aspirin) .... Take 2 tabs once daily 6)  Cardizem Cd 120 Mg Xr24h-cap (Diltiazem hcl coated beads) .... Once daily  Hypertension Assessment/Plan:      The patient's hypertensive risk group is category B: At least one risk factor (excluding diabetes) with no target organ damage.  Today's blood pressure is 128/86.  Her blood pressure goal is < 140/90.  Colorectal Screening:  Current Recommendations:    Colonoscopy recommended: scheduled with G.I.  PAP Screening:    Hx Cervical Dysplasia in last 5 yrs? Yes    3 normal PAP smears in last 5 yrs? Yes    Last PAP smear:  03/21/2009  PAP Smear Results:    Date of Exam:  03/21/2009    Results:  Normal  Mammogram Screening:    Last Mammogram:  03/01/2004    Reviewed Mammogram recommendations:  mammogram ordered  Osteoporosis Risk Assessment:  Risk Factors for Fracture or Low Bone Density:   Race (White or Asian):     yes   Smoking status:       quit  Immunization & Chemoprophylaxis:  Tetanus vaccine: Historical  (05/31/2002)    Influenza vaccine: Historical  (12/02/2008)  Patient Instructions: 1)  Please schedule a follow-up appointment in 2 months. 2)  It is important that you exercise regularly at least 20 minutes 5 times a week. If you develop chest pain, have severe difficulty breathing, or feel very tired , stop exercising immediately and seek medical attention. 3)  You need to lose weight. Consider a lower calorie diet and regular exercise.  4)  Schedule your mammogram. 5)  Schedule a colonoscopy/sigmoidoscopy to help detect colon cancer. 6)  You need to have a Pap Smear to prevent cervical cancer. 7)  Take an Aspirin every day.   Preventive Care Screening  Last Tetanus Booster:     Date:  05/31/2002    Results:  Historical     Immunization History:  Influenza Immunization History:    Influenza:  historical (12/02/2008)

## 2010-03-31 NOTE — Assessment & Plan Note (Signed)
Summary: rov/afib/jml   Referring Provider:  Jama Flavors Primary Provider:  Newt Lukes MD  CC:  rov/afib.  Pt broke her elbow on the 7th when she tripped over a speed bump.  She had been put on Pradaxa by Dr. Yetta Barre for Afib but only took one dose and had quite a bit of blood in her urine.  She stopped taking this per the advice of Dr. Yetta Barre.  History of Present Illness: [name] is seen  in followup forparoxysmal atrial fibrillation and has not tolerated in the past either flecainide and or Rythmol. After her last visit we decided to put Tikosyn on the table for now. However, she intercurrently was diagnosed and treated for cervical cancer that was complicated by obstructive left-sided nephropathy requiring stenting and apparently associated with loss of function.    She has some shortness of breath with exertion but no peripheral edema. She has had no palpitations. Review her echo from 2006 demonstrated no left ventricular hypertrophy and from 2009 demonstrated normal left ventricular function.  She saw Dr. Yetta Barre recently he tried to start her on Pradaxa for thromboembolic risk reduction. This was unfortunately complicated by hematuria prompting her to stop it.  On her visit to her urologist she tripped across speed. bump and endedup  breaking her elbow.    Current Medications (verified): 1)  Multivitamins  Tabs (Multiple Vitamin) .... Take 1 By Mouth Qd 2)  Prilosec 20 Mg Cpdr (Omeprazole) .... Take 1 By Mouth Qd 3)  Digoxin 0.25 Mg Tabs (Digoxin) .... Take One Tablet By Mouth Daily 4)  Furosemide 20 Mg Tabs (Furosemide) .... As Needed 5)  Aspirin 81 Mg Tabs (Aspirin) .... Take 2 Tabs Once Daily 6)  Aleve 220 Mg Tabs (Naproxen Sodium) .... Take One Tablet Once Daily  Allergies (verified): 1)  ! Codeine 2)  ! Morphine  Past History:  Past Medical History: Last updated: 09/19/2008 Cervical cancer, hx of Anticoagulation therapy Atrial  fibrillation Depression Hypertension Anemia-NOS  Past Surgical History: Last updated: 08/14/08 Hysterectomy Oophorectomy Cholecystectomy (1984) Urethal stent (2005 & 2010) Cardioversion (08/2007) Tonsillectomy (1969)  Family History: Last updated: 2008/08/14 Mother died of lung cancer, was a smoker.  Family hx also positive of hypertension, cardiac disease, and CVA She has 2 sisters who are healthy.  Her son has asthma  Social History: Last updated: Aug 14, 2008 Former Smoker Alcohol use-no Drug use-no lives with female partner - has grown son and 2 g-sons  Vital Signs:  Patient profile:   60 year old female Height:      61.5 inches Weight:      219 pounds BMI:     40.86 Pulse rate:   98 / minute Pulse rhythm:   irregular BP sitting:   136 / 88  (left arm) Cuff size:   large  Vitals Entered By: [name] CMA (March 19, 2009 9:13 AM)  Physical Exam  General:  The patient was alert and oriented in no acute distress.Neck veins were flat, carotids were brisk. Lungs were clear. Heart sounds were irregular without murmurs or gallops. Abdomen was soft with active bowel sounds. There is no clubbing cyanosis or edema.  Her right arm is in a cast    Impression & Recommendations:  Problem # 1:  ATRIAL FIBRILLATION (ICD-427.31) She is atrial  When she stops taking the Aleve which is for arthritis have asked her to increase her aspirin to 325 mg a day.fibrillation with a modestly rapid ventricular response. Based on a race to data  it is probably not too fast but with her shortness of breath with exertion I would like to try to restricted a little bit. She has been on beta blockers in the past; they were poorly tolerated. She has a history of constipation. We will just try her on Cardizem 120 mg a day. I have reviewed with her the side effects.  As relates to thromboembolic risk reduction, she has a CHADS score of one and a CHADS VASC score of 2. 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  He is taking Aleve as well as aspirin. Her updated medication list for this problem includes:    Digoxin 0.25 Mg Tabs (Digoxin) .[name]... Take one tablet by mouth daily    Aspirin 81 Mg Tabs (Aspirin) .[name]... Take 2 tabs once daily  Orders: EKG w/ Interpretation (93000)  Problem # 2:  ORTHOSTATIC HYPOTENSION (ICD-458.0) without significant symptoms  Problem # 3:  HYPERTENSION (ICD-401.9) Reasonably controlled Her updated medication list for this problem includes:    Furosemide 20 Mg Tabs (Furosemide) .[name]... As needed    Aspirin 81 Mg Tabs (Aspirin) .[name]... Take 2 tabs once daily    Cardizem Cd 120 Mg Xr24h-cap (Diltiazem hcl coated beads) ..... Once daily  Problem # 4:  EDEMA (ICD-782.3) D. see how she does with the addition of a calcium blocker to  Patient Instructions: 1)  Your physician has recommended you make the following change in your medication: START CARDIZEM 120MG  DAILY  Prescriptions: CARDIZEM CD 120 MG XR24H-CAP (DILTIAZEM HCL COATED BEADS) once daily  #30 x 11   Entered by:   [name] Dull, RN, BSN   Authorized by:   Nathen May, MD, [facility]   Signed by:   [name] Dull, RN, BSN on 03/19/2009   Method used:   Electronically to        [facility] (retail)       [address]       [place], [place]  [id number]       Ph: [phone]       Fax: [phone]   RxID:   [id number]

## 2010-03-31 NOTE — Assessment & Plan Note (Signed)
Summary: nep/afib/second opinion/ok per kelly   Visit Type:  Follow-up Referring Provider:  Jama Flavors Primary Provider:  Newt Lukes MD   History of Present Illness: The patient presents today for electrophysiology followup of her afib.  She reports fatigue frequently.   She finds that after walking on the treadmill for 6-7 minutes that she "gives out" and becomes short of breath.  She reports that her heart rate is 120s to 130s during this.  She also notices occasional palpitations at night.  She feels that she is likely in afib most of the time.  She was previously cardioverted but returned to afib within 24 hours.  The patient denies symptoms of chest pain, orthopnea, PND, dizziness, presyncope, syncope, or neurologic sequela.  She has improved BLE edema. The patient is tolerating medications without difficulties and is otherwise without complaint today.   Current Medications (verified): 1)  Multivitamins  Tabs (Multiple Vitamin) .... Take 1 By Mouth Qd 2)  Prilosec 20 Mg Cpdr (Omeprazole) .... Take 1 By Mouth Qd 3)  Digoxin 0.25 Mg Tabs (Digoxin) .... Take One Tablet By Mouth Daily 4)  Furosemide 20 Mg Tabs (Furosemide) .... As Needed 5)  Aspirin 81 Mg Tabs (Aspirin) .... Take 2 Tabs Once Daily 6)  Cardizem Cd 120 Mg Xr24h-Cap (Diltiazem Hcl Coated Beads) .... Once Daily  Allergies: 1)  ! Codeine 2)  ! Morphine  Past History:  Past Medical History: Cervical cancer, s/p chemotherapy 2009 Persistent Atrial fibrillation Depression Hypertension Anemia  Past Surgical History: Reviewed history from 08/01/2008 and no changes required. Hysterectomy Oophorectomy Cholecystectomy (4332) Urethal stent (2005 & 2010) Cardioversion (08/2007) Tonsillectomy (1969)  Family History: Reviewed history from 08/01/2008 and no changes required. Mother died of lung cancer, was a smoker.  Family hx also positive of hypertension, cardiac disease, and CVA She has 2 sisters who are  healthy.  Her son has asthma  Social History: Former Smoker, quit 1986 Alcohol use-no Drug use-no lives with significant other is [place], . 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   Vital Signs:  Patient profile:   60 year old female Height:      61.5 inches Weight:      216 pounds BMI:     40.30 Pulse rate:   104 / minute BP sitting:   102 / 82  (left arm)  Vitals Entered By: Laurance Flatten CMA (April 21, 2009 11:59 AM)  Physical Exam  General:  Well developed, well nourished, in no acute distress. Head:  normocephalic and atraumatic Eyes:  PERRLA/EOM intact; conjunctiva and lids normal. Nose:  no deformity, discharge, inflammation, or lesions Mouth:  Teeth, gums and palate normal. Oral mucosa normal. Neck:  Neck supple, no JVD. No masses, thyromegaly or abnormal cervical nodes. Lungs:  Clear bilaterally to auscultation and percussion. Heart:  irregular tachycardic rhythm, no m/r/g Abdomen:  Bowel sounds positive; abdomen soft and non-tender without masses, organomegaly, or hernias noted. No hepatosplenomegaly. Msk:  Back normal, normal gait. Muscle strength and tone normal. Pulses:  pulses normal in all 4 extremities Extremities:  No clubbing or cyanosis.  no edema Neurologic:  Alert and oriented x 3.  CNIII-XII intact, strength/sensation intact Skin:  Intact without lesions or rashes. Cervical Nodes:  no significant adenopathy Psych:  Normal affect.   Impression & Recommendations:  Problem # 1:  ATRIAL FIBRILLATION (ICD-427.31)  The patient presents today for further evalutation of symptomatic afib.  She has previously failed medical therapy with Ic's.  Her LA size is 51mm.  She reports previously returning to afib within 24 hours after cardioversion.  In addition, she developed significant urinary bleeding with pradaxa, thus limiting anticoagulation.   Her CHADS2 score is 1 and therefore the patient has opted for ASA 325mg  daily. Therapeutic strategies for afib including medicine and ablation were discussed in detail with the patient today. Given her moderate LA enlargement, I think that our ability to maintain sinus rhythm are quite limited.  Her symptoms are likely exacerbated by elevated heart rates.  We will therefore attempt rhythm control at this time.  I have increased diltiazem to 120mg  two times a day today.  She will return to further assess her ventricular rates in 6 wks.  We could consider multaq for rate control.  We could also consider tikosyn (or multaq) followed by cardioversion, however, I would recommend restarting coumadin before doing this.  Problem # 2:  ORTHOSTATIC HYPOTENSION (ICD-458.0) stable cautious addition of additional diltiazem  Problem # 3:  EDEMA (ICD-782.3) stable watch for further edema with additional diltiazem  Problem # 4:  HYPERTENSION (ICD-401.9) stable Her updated medication list for this problem includes:    Furosemide 20 Mg Tabs (Furosemide) .[name]... As needed    Aspirin 81 Mg Tabs (Aspirin) .[name]... Take 2 tabs once daily    Cardizem Cd 120 Mg Xr24h-cap (Diltiazem hcl coated beads) ..... One by mouth two times a day  Patient Instructions: 1)  Your physician recommends that you schedule a follow-up appointment in: 4-6 weeks with Dr Johney Frame 2)  Your physician has recommended you make the following change in your medication: increase your Cardizem to 120mg  two times a day Prescriptions: CARDIZEM CD 120 MG XR24H-CAP (DILTIAZEM HCL COATED BEADS) one by mouth two times a day  #60 x 11   Entered by:   Dennis Bast, RN, BSN   Authorized by:   Hillis Range, MD   Signed by:   Dennis Bast, RN, BSN on 04/21/2009   Method used:   Electronically to        [facility] (retail)       [address]       [place], [place]  [id number]       Ph: [phone]       Fax:  [phone]   RxID:   [id number]

## 2010-03-31 NOTE — Assessment & Plan Note (Signed)
Summary: 1 MONTH ROV/SL   Visit Type:  Follow-up Referring Provider:  Jama Flavors Primary Provider:  Newt Lukes MD   History of Present Illness: The patient presents today for routine electrophysiology followup. Her palpitations and fatigue have improved "some" with increased rate control.  The patient denies symptoms of  chest pain, shortness of breath, orthopnea, PND, lower extremity edema, dizziness, presyncope, syncope, or neurologic sequela. The patient is tolerating medications without difficulties and is otherwise without complaint today.   Current Medications (verified): 1)  Multivitamins  Tabs (Multiple Vitamin) .... Take 1 By Mouth Qd 2)  Prilosec 20 Mg Cpdr (Omeprazole) .... Take 1 By Mouth Qd 3)  Digoxin 0.25 Mg Tabs (Digoxin) .... Take One Tablet By Mouth Daily 4)  Furosemide 20 Mg Tabs (Furosemide) .... As Needed 5)  Aspirin 81 Mg Tabs (Aspirin) .... Take 2 Tabs Once Daily 6)  Cardizem Cd 120 Mg Xr24h-Cap (Diltiazem Hcl Coated Beads) .... One By Mouth Two Times A Day 7)  Dulcolax 5 Mg  Tbec (Bisacodyl) .... Day Before Procedure Take 2 At 3pm and 2 At 8pm. 8)  Metoclopramide Hcl 10 Mg  Tabs (Metoclopramide Hcl) .... As Per Prep Instructions. 9)  Miralax   Powd (Polyethylene Glycol 3350) .... As Per Prep  Instructions. 10)  Cipro 250 Mg Tabs (Ciprofloxacin Hcl) .... Two Times A Day 11)  Lamisil 250 Mg Tabs (Terbinafine Hcl) .... Take One Tablet By Mouth Once Daily.  Allergies: 1)  ! Codeine 2)  ! Morphine  Past History:  Past Medical History: Reviewed history from 04/21/2009 and no changes required. Cervical cancer, s/p chemotherapy 2009 Persistent Atrial fibrillation Depression Hypertension Anemia  Past Surgical History: Reviewed history from 08/01/2008 and no changes required. Hysterectomy Oophorectomy Cholecystectomy (1610) Urethal stent (2005 & 2010) Cardioversion (08/2007) Tonsillectomy (1969)  Social History: Reviewed history from  04/21/2009 and no changes required. Former Smoker, quit 1986 Alcohol use-no Drug use-no lives with significant other is [place], [place]. 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   Vital Signs:  Patient profile:   60 year old female Height:      61.5 inches Weight:      215 pounds BMI:     40.11 Pulse rate:   81 / minute BP sitting:   100 / 70  Vitals Entered By: Laurance Flatten CMA (May 19, 2009 2:26 PM)  Physical Exam  General:  Well developed, well nourished, in no acute distress. Head:  normocephalic and atraumatic Eyes:  PERRLA/EOM intact; conjunctiva and lids normal. Mouth:  Teeth, gums and palate normal. Oral mucosa normal. Neck:  Neck supple, no JVD. No masses, thyromegaly or abnormal cervical nodes. Lungs:  Clear bilaterally to auscultation and percussion. Heart:  irregular tachycardic rhythm, no m/r/g Abdomen:  Bowel sounds positive; abdomen soft and non-tender without masses, organomegaly, or hernias noted. No hepatosplenomegaly. Msk:  Back normal, normal gait. Muscle strength and tone normal. Pulses:  pulses normal in all 4 extremities Extremities:  No clubbing or cyanosis.  no edema Neurologic:  Alert and oriented x 3.  CNIII-XII intact, strength/sensation intact Skin:  Intact without lesions or rashes. Psych:  Normal affect.   Impression & Recommendations:  Problem # 1:  ATRIAL FIBRILLATION (ICD-427.31) symptoms are improving with rate control. Will increase cardizem to 120mg  qam and 240mg  qpm as BP allows Continue ASA 325mg  daily  If her symptoms do not improve or her rates are not controlled, we will consider multaq, though this would require initiation of coumadin.  She had hematuria with pradaxa and is therefore relucant to start anticoagulation at this time  Problem # 2:  HYPERTENSION (ICD-401.9)  at goal  Patient Instructions: 1)  Your  physician recommends that you schedule a follow-up appointment in: 4 weeks with Dr Johney Frame 2)  Your physician has recommended you make the following change in your medication: increase your Diltiazem to 120mg  in the am and 240mg  in the pm Prescriptions: CARDIZEM CD 120 MG XR24H-CAP (DILTIAZEM HCL COATED BEADS) take one tablet in the am and take two tablets in the pm  #90 x 6   Entered by:   Dennis Bast, RN, BSN   Authorized by:   Hillis Range, MD   Signed by:   Dennis Bast, RN, BSN on 05/19/2009   Method used:   Electronically to        [facility] (retail)       [address]       [place], [place]  [id number]       Ph: [phone]       Fax: [phone]   RxID:   [id number]

## 2010-03-31 NOTE — Progress Notes (Signed)
Summary: Blood in urine  Phone Note Call from Patient Call back at Work Phone [phone]   Summary of Call: Pt left vm c/o blood in her urine after starting new med. left mess to call office back  Initial call taken by: Lamar Sprinkles, CMA,  March 07, 2009 10:47 AM  Follow-up for Phone Call        Pt took one dose yesterday of new med and began to see a signifigant amount of blood in her urine. Today blood has almost cleared. She has not taken anymore of the pradaxa since. Pt has a urethral stent and wants to know if she should call her urologist? Also should she continue pradaxa? or other med? No pain or urinary symptoms.  Follow-up by: Lamar Sprinkles, CMA,  March 07, 2009 11:00 AM  Additional Follow-up for Phone Call Additional follow up Details #1::        stop pradaxa and yes, notify her urologist, no more blood thinners unless her cardiologist recommends one Additional Follow-up by: Etta Grandchild MD,  March 07, 2009 11:09 AM    Additional Follow-up for Phone Call Additional follow up Details #2::    Pt informed  Follow-up by: Lamar Sprinkles, CMA,  March 07, 2009 11:12 AM

## 2010-03-31 NOTE — Letter (Signed)
Summary: [facility] Instructions  [facility]  [address] [place], [place] [address]   Phone: [phone]  Fax: [phone]       [name]    1950-11-26    MRN: [id number]       Procedure Day [name]:  [name]  05/26/09     Arrival Time: 7:30AM     Procedure Time:  8:30AM     Location of Procedure:                    [facility] (4th Floor)    PREPARATION FOR COLONOSCOPY WITH MIRALAX  Starting 5 days prior to your procedure 05/21/09 do not eat nuts, seeds, popcorn, corn, beans, peas,  salads, or any raw vegetables.  Do not take any fiber supplements (e.g. Metamucil, Citrucel, and Benefiber). ____________________________________________________________________________________________________   THE DAY BEFORE YOUR PROCEDURE         DATE:05/25/09   DAY: SUNDAY  1   Drink clear liquids the entire day-NO SOLID FOOD  2   Do not drink anything colored red or purple.  Avoid juices with pulp.  No orange juice.  3   Drink at least 64 oz. (8 glasses) of fluid/clear liquids during the day to prevent dehydration and help the prep work efficiently.  CLEAR LIQUIDS INCLUDE: Water Jello Ice Popsicles Tea (sugar ok, no milk/cream) Powdered fruit flavored drinks Coffee (sugar ok, no milk/cream) Gatorade Juice: apple, white grape, white cranberry  Lemonade Clear bullion, consomm, broth Carbonated beverages (any kind) Strained chicken noodle soup Hard Candy  4   Mix the entire bottle of Miralax with 64 oz. of Gatorade/Powerade in the morning and put in the refrigerator to chill.  5   At 3:00 pm take 2 Dulcolax/Bisacodyl tablets.  6   At 4:30 pm take one Reglan/Metoclopramide tablet.  7  Starting at 5:00 pm drink one 8 oz glass of the Miralax mixture every 15-20 minutes until you have finished drinking the entire 64 oz.  You should finish drinking prep around 7:30 or 8:00 pm.  8   If you are nauseated, you may take the 2nd Reglan/Metoclopramide  tablet at 6:30 pm.        9    At 8:00 pm take 2 more DULCOLAX/Bisacodyl tablets.     THE DAY OF YOUR PROCEDURE      DATE:  05/26/09  DAY: [name]  You may drink clear liquids until 6:30AM  (2 HOURS BEFORE PROCEDURE).   MEDICATION INSTRUCTIONS  Unless otherwise instructed, you should take regular prescription medications with a small sip of water as early as possible the morning of your procedure.    Additional medication instructions: Be sure to take your cardiac medicines the morning of procedure.         OTHER INSTRUCTIONS  You will need a responsible adult at least 57 years of age to accompany you and drive you home.   This person must remain in the waiting room during your procedure.  Wear loose fitting clothing that is easily removed.  Leave jewelry and other valuables at home.  However, you may wish to bring a book to read or an iPod/MP3 player to listen to music as you wait for your procedure to start.  Remove all body piercing jewelry and leave at home.  Total time from sign-in until discharge is approximately 2-3 hours.  You should go home directly after your procedure and rest.  You can resume normal activities the  day after your procedure.  The day of your procedure you should not:   Drive   Make legal decisions   Operate machinery   Drink alcohol   Return to work  You will receive specific instructions about eating, activities and medications before you leave.   The above instructions have been reviewed and explained to me by   Wyona Almas RN  May 14, 2009 4:02 PM     I fully understand and can verbalize these instructions _____________________________ Date _______

## 2010-03-31 NOTE — Progress Notes (Signed)
Summary: PCP CHANGE LESCHBER TO JONES  ---- Converted from flag ---- ---- 04/09/2009 4:55 PM, Newt Lukes MD wrote: sure thing  ---- 04/09/2009 4:28 PM, Verdell Face wrote: Dr Felicity Coyer  This pt requesting to change PCP from Felicity Coyer to Yetta Barre, ok? This has been ok'd w/Dr Vinie Sill ------------------------------

## 2010-03-31 NOTE — Assessment & Plan Note (Signed)
Summary: 1 MTH FU  STC   Vital Signs:  Patient profile:   60 year old female Height:      61.5 inches Weight:      225 pounds BMI:     41.98 O2 Sat:      98 % on Room air Temp:     97.8 degrees F oral Pulse rate:   102 / minute Pulse rhythm:   irregular Resp:     16 per minute BP sitting:   138 / 72  (left arm) Cuff size:   large  Vitals Entered By: Rock Nephew CMA (August 01, 2009 2:30 PM)  Nutrition Counseling: Patient's BMI is greater than 25 and therefore counseled on weight management options.  O2 Flow:  Room air  Primary Care Provider:  Yetta Barre   History of Present Illness: She returns c/o a 2 week hx. of pain and itching deep down in her right ear.  Preventive Screening-Counseling & Management  Alcohol-Tobacco     Alcohol drinks/day: 0     Smoking Status: quit  Medications Prior to Update: 1)  Multivitamins  Tabs (Multiple Vitamin) .... Take 1 By Mouth Qd 2)  Prilosec 20 Mg Cpdr (Omeprazole) .... Take 1 By Mouth Qd 3)  Digoxin 0.25 Mg Tabs (Digoxin) .... Take One Tablet By Mouth Daily 4)  Furosemide 20 Mg Tabs (Furosemide) .... As Needed 5)  Aspirin 81 Mg Tabs (Aspirin) .... Take 2 Tabs Once Daily 6)  Diltiazem Hcl Er Beads 240 Mg Xr24h-Cap (Diltiazem Hcl Er Beads) .... One By Mouth Bid 7)  Lamisil 250 Mg Tabs (Terbinafine Hcl) .... Take One Tablet By Mouth Once Daily.  Current Medications (verified): 1)  Multivitamins  Tabs (Multiple Vitamin) .... Take 1 By Mouth Qd 2)  Prilosec 20 Mg Cpdr (Omeprazole) .... Take 1 By Mouth Qd 3)  Digoxin 0.25 Mg Tabs (Digoxin) .... Take One Tablet By Mouth Daily 4)  Furosemide 20 Mg Tabs (Furosemide) .... As Needed 5)  Aspirin 81 Mg Tabs (Aspirin) .... Take 2 Tabs Once Daily 6)  Diltiazem Hcl Er Beads 240 Mg Xr24h-Cap (Diltiazem Hcl Er Beads) .... One By Mouth Bid 7)  Lamisil 250 Mg Tabs (Terbinafine Hcl) .... Take One Tablet By Mouth Once Daily. 8)  Cortisporin 3.5-10000-1 Soln (Neomycin-Polymyxin-Hc) .... 2 Gtts Three  Times A Day For 7 Days Into The Right Ear  Allergies (verified): 1)  ! Codeine 2)  ! Morphine 3)  ! Pradaxa (Dabigatran Etexilate Mesylate)  Past History:  Past Medical History: Reviewed history from 04/21/2009 and no changes required. Cervical cancer, s/p chemotherapy 2009 Persistent Atrial fibrillation Depression Hypertension Anemia  Past Surgical History: Reviewed history from 08/01/2008 and no changes required. Hysterectomy Oophorectomy Cholecystectomy (6213) Urethal stent (2005 & 2010) Cardioversion (08/2007) Tonsillectomy (1969)  Family History: Reviewed history from 08/01/2008 and no changes required. Mother died of lung cancer, was a smoker.  Family hx also positive of hypertension, cardiac disease, and CVA She has 2 sisters who are healthy.  Her son has asthma  Social History: Reviewed history from 04/21/2009 and no changes required. Former Smoker, quit 1986 Alcohol use-no Drug use-no lives with significant other is [place], [place]. 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   ENT:  Complains of earache; denies decreased hearing, difficulty swallowing, ear discharge, hoarseness,  nasal congestion, nosebleeds, postnasal drainage, ringing in ears, sinus pressure, and sore throat.  Physical Exam  General:  alert, well-developed, well-nourished, well-hydrated, appropriate dress, normal appearance, healthy-appearing, and cooperative to examination.   Head:  normocephalic, atraumatic, no abnormalities observed, and no abnormalities palpated.   Eyes:  vision grossly intact and no injection.   Ears:  right TM is normal. L ear normal and R canal inflamed.   Nose:  External nasal examination shows no deformity or inflammation. Nasal  mucosa are pink and moist without lesions or exudates. Mouth:  Oral mucosa and oropharynx without lesions or exudates.  Teeth in good repair. Neck:  supple, full ROM, no masses, no thyromegaly, no thyroid nodules or tenderness, no JVD, normal carotid upstroke, no carotid bruits, no cervical lymphadenopathy, and no neck tenderness.   Lungs:  normal respiratory effort, no intercostal retractions, no accessory muscle use, normal breath sounds, no dullness, no fremitus, no crackles, and no wheezes.   Heart:  normal rate, regular rhythm, no murmur, no gallop, no rub, and no JVD.   Abdomen:  soft, non-tender, normal bowel sounds, no distention, no masses, no guarding, no rigidity, no rebound tenderness, no hepatomegaly, and no splenomegaly.   Msk:  No deformity or scoliosis noted of thoracic or lumbar spine.   Pulses:  R and L carotid,radial,femoral,dorsalis pedis and posterior tibial pulses are full and equal bilaterally Extremities:  No clubbing, cyanosis, edema, or deformity noted with normal full range of motion of all joints.   Neurologic:  No cranial nerve deficits noted. Station and gait are normal. Plantar reflexes are down-going bilaterally. DTRs are symmetrical throughout. Sensory, motor and coordinative functions appear intact. Skin:  Intact without suspicious lesions or rashes Cervical Nodes:  no anterior cervical adenopathy and no posterior cervical adenopathy.   Axillary Nodes:  no R axillary adenopathy and no L axillary adenopathy.   Psych:  Cognition and judgment appear intact. Alert and cooperative with normal attention span and concentration. No apparent delusions, illusions, hallucinations   Impression & Recommendations:  Problem # 1:  OTHER ACUTE OTITIS EXTERNA (ICD-380.22) Assessment New start cortisporin otic susp  Complete Medication List: 1)  Multivitamins Tabs (Multiple vitamin) .... Take 1 by mouth qd 2)  Prilosec 20 Mg Cpdr (Omeprazole) .... Take 1 by mouth qd 3)   Digoxin 0.25 Mg Tabs (Digoxin) .... Take one tablet by mouth daily 4)  Furosemide 20 Mg Tabs (Furosemide) .... As needed 5)  Aspirin 81 Mg Tabs (Aspirin) .... Take 2 tabs once daily 6)  Diltiazem Hcl Er Beads 240 Mg Xr24h-cap (Diltiazem hcl er beads) .... One by mouth bid 7)  Lamisil 250 Mg Tabs (Terbinafine hcl) .... Take one tablet by mouth once daily. 8)  Cortisporin 3.5-10000-1 Soln (Neomycin-polymyxin-hc) .... 2 gtts three times a day for 7 days into the right ear  Patient Instructions: 1)  Please schedule a follow-up appointment in 2 weeks. Prescriptions: CORTISPORIN 3.5-10000-1 SOLN (NEOMYCIN-POLYMYXIN-HC) 2 gtts three times a day for 7 days into the right ear  #10 ml x 0   Entered and Authorized by:   Etta Grandchild MD   Signed by:   Etta Grandchild MD on 08/01/2009   Method used:   Electronically to        [facility] (retail)       [address]       [place], [place]  [id number]       Ph: [phone]  Fax: [phone]   RxID:   [id number]

## 2010-04-01 ENCOUNTER — Ambulatory Visit: Payer: 59 | Admitting: Radiation Oncology

## 2010-04-02 NOTE — Progress Notes (Signed)
Summary: question re meds  Phone Note Call from Patient Call back at Work Phone [phone]   Caller: Patient Reason for Call: Talk to Nurse Summary of Call: pt has question re meds. pt would like to talk to kelly. Initial call taken by: Roe Coombs,  March 09, 2010 11:38 AM  Follow-up for Phone Call        okay to take Claritin for her allergies per Dr Johney Frame Dennis Bast, RN, BSN  March 09, 2010 11:47 AM

## 2010-04-02 NOTE — Letter (Signed)
Summary: Results Follow-up  [facility]  [address]   [place], [place] [address]   Phone: [phone]  Fax: [phone]     February 10, 2010 MRN: [id number]   [name] [address] [place], [place]  [address]   Dear [name],  We have received the results from your recent tests and have been unable to contact you.  Please call our office at the number listed above so that Dr. Jenel Lucks nurse or medical assistant may review the results with you.    Thank you,  [facility]

## 2010-04-08 NOTE — Letter (Signed)
Summary: [facility]  [facility]   Imported By: Sherian Rein 04/02/2010 09:54:01  _____________________________________________________________________  External Attachment:    Type:   Image     Comment:   External Document

## 2010-04-15 ENCOUNTER — Encounter (HOSPITAL_BASED_OUTPATIENT_CLINIC_OR_DEPARTMENT_OTHER): Payer: 59 | Admitting: Oncology

## 2010-04-15 ENCOUNTER — Encounter: Payer: Self-pay | Admitting: Internal Medicine

## 2010-04-15 DIAGNOSIS — C775 Secondary and unspecified malignant neoplasm of intrapelvic lymph nodes: Secondary | ICD-10-CM

## 2010-04-15 DIAGNOSIS — C539 Malignant neoplasm of cervix uteri, unspecified: Secondary | ICD-10-CM

## 2010-04-15 DIAGNOSIS — Z5111 Encounter for antineoplastic chemotherapy: Secondary | ICD-10-CM

## 2010-04-15 LAB — COMPREHENSIVE METABOLIC PANEL
ALT: 30 U/L (ref 0–35)
Albumin: 4.4 g/dL (ref 3.5–5.2)
CO2: 22 mEq/L (ref 19–32)
Calcium: 9.4 mg/dL (ref 8.4–10.5)
Chloride: 102 mEq/L (ref 96–112)
Glucose, Bld: 95 mg/dL (ref 70–99)
Potassium: 4 mEq/L (ref 3.5–5.3)
Sodium: 139 mEq/L (ref 135–145)
Total Bilirubin: 0.4 mg/dL (ref 0.3–1.2)
Total Protein: 6.9 g/dL (ref 6.0–8.3)

## 2010-04-15 LAB — CBC WITH DIFFERENTIAL/PLATELET
BASO%: 0.8 % (ref 0.0–2.0)
Eosinophils Absolute: 0.1 10*3/uL (ref 0.0–0.5)
MCHC: 34.8 g/dL (ref 31.5–36.0)
MONO#: 0.6 10*3/uL (ref 0.1–0.9)
NEUT#: 1.8 10*3/uL (ref 1.5–6.5)
RBC: 3.76 10*6/uL (ref 3.70–5.45)
WBC: 3.4 10*3/uL — ABNORMAL LOW (ref 3.9–10.3)
lymph#: 0.9 10*3/uL (ref 0.9–3.3)

## 2010-04-15 LAB — MAGNESIUM: Magnesium: 2 mg/dL (ref 1.5–2.5)

## 2010-04-16 NOTE — Letter (Signed)
Summary: [facility]  [facility]   Imported By: Sherian Rein 04/09/2010 08:55:51  _____________________________________________________________________  External Attachment:    Type:   Image     Comment:   External Document

## 2010-04-22 NOTE — Letter (Signed)
Summary: [facility]: Reevaluation Note  [facility]: Reevaluation Note   Imported By: Earl Many 04/15/2010 09:12:47  _____________________________________________________________________  External Attachment:    Type:   Image     Comment:   External Document

## 2010-04-23 ENCOUNTER — Encounter: Payer: Self-pay | Admitting: Internal Medicine

## 2010-04-23 ENCOUNTER — Ambulatory Visit (INDEPENDENT_AMBULATORY_CARE_PROVIDER_SITE_OTHER): Payer: 59 | Admitting: Internal Medicine

## 2010-04-23 DIAGNOSIS — I10 Essential (primary) hypertension: Secondary | ICD-10-CM

## 2010-04-23 DIAGNOSIS — I4891 Unspecified atrial fibrillation: Secondary | ICD-10-CM

## 2010-04-27 ENCOUNTER — Ambulatory Visit: Payer: 59 | Attending: Radiation Oncology | Admitting: Radiation Oncology

## 2010-04-28 NOTE — Assessment & Plan Note (Signed)
Summary: 3 months ROV.sl/pmo   Visit Type:  Follow-up Referring Provider:  Jama Flavors Primary Provider:  Yetta Stone  CC:  shortness of breath and swelling.  History of Present Illness: The patient presents today for routine electrophysiology followup.  Unfortunately, she has returned to atrial fibrillation.  She reports fatigue and decreased exercise tolerance.  She reports compliance with tikosyn and pradaxa.   Unfortunately, she has been diagnosed with recurrent cervical cancer.  She recently received additional XRT and chemotherapy.  The patient denies symptoms of chest pain, orthopnea, PND, lower extremity edema, dizziness, presyncope, syncope, or neurologic sequela. The patient is tolerating medications without difficulties and is otherwise without complaint today.   Current Medications (verified): 1)  Multivitamins  Tabs (Multiple Vitamin) .... Take 1 By Mouth Qd 2)  Prilosec 20 Mg Cpdr (Omeprazole) .... Take 1 By Mouth Qd 3)  Furosemide 20 Mg Tabs (Furosemide) .... As Needed 4)  Pradaxa 150 Mg Caps (Dabigatran Etexilate Mesylate) .... One By Mouth Two Times A Day 5)  Metoprolol Tartrate 50 Mg Tabs (Metoprolol Tartrate) .[name].. 1 1/2  By Mouth Two Times A Day 6)  Tikosyn 250 Mcg Caps (Dofetilide) .... Two Times A Day  Allergies (verified): 1)  ! Codeine 2)  ! Morphine  Past History:  Past Medical History: Reviewed history from 04/21/2009 and no changes required. Cervical cancer, s/p chemotherapy 2009 Persistent Atrial fibrillation Depression Hypertension Anemia  Past Surgical History: Reviewed history from 08/01/2008 and no changes required. Hysterectomy Oophorectomy Cholecystectomy (0454) Urethal stent (2005 & 2010) Cardioversion (08/2007) Tonsillectomy (1969)  Social History: Reviewed history from 04/21/2009 and no changes required. Former Smoker, quit 1986 Alcohol use-no Drug use-no lives with significant other is [place], [place]. 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   Vital Signs:  Patient profile:   60 year old female Height:      61.5 inches Weight:      241 pounds BMI:     44.96 Pulse rate:   101 / minute BP sitting:   154 / 96  (left arm) Cuff size:   regular  Vitals Entered By: Caralee Ates CMA (April 23, 2010 3:38 PM)  Physical Exam  General:  overweight, NAD Head:  normocephalic and atraumatic Eyes:  PERRLA/EOM intact; conjunctiva and lids normal. Mouth:  Teeth, gums and palate normal. Oral mucosa normal. Neck:  supple Lungs:  Clear bilaterally to auscultation and percussion. Heart:  iRRR, no m/r/g Abdomen:  Bowel sounds positive; abdomen soft and non-tender without masses, organomegaly, or hernias noted. No hepatosplenomegaly. Msk:  Back normal, normal gait. Muscle strength and tone normal. Extremities:  No clubbing or cyanosis. Neurologic:  Alert and oriented x 3. Skin:  Intact without lesions or rashes. Psych:  Normal affect.   EKG  Procedure date:  04/23/2010  Findings:      afib, V rate 107, nonspecific ST/T changes, QTc 501  Impression & Recommendations:  Problem # 1:  ATRIAL FIBRILLATION (ICD-427.31) The patient has recurrent symptomatic persistant atrial fibrillation.  She has now failed medical therapy with tikosyn.  We will therefore stop tikosyn today.  I think that our only reasonable antiarrhythmic options at this point would be amiodarone.  IN the setting of recurrent malignancy, I am reluctant to start amiodarone at this time.  We will therefore continue rate control with metoprolol.  I have instructed her to  increase metoprolol if tolerated to 100mg  two times a day.  If she does well from the standpoint of her malignancy, we may be able to try rhythm control in several months with amiodarone.  We will contemplate this option upon return.  She will continue pradaxa in the  interim.  Problem # 2:  HYPERTENSION (ICD-401.9) above goal increase metoprolol as above  Problem # 3:  CERVICAL CANCER, HX OF (ICD-V10.41) as above  Other Orders: EKG w/ Interpretation ([address])  Patient Instructions: 1)  Your physician recommends that you schedule a follow-up appointment in: 2 months with Dr. Johney Frame 2)  Your physician has recommended you make the following change in your medication: STOP Tikosyn:   IN 3 DAYS increase metoprolol to 2 tablets twice a day Prescriptions: FUROSEMIDE 20 MG TABS (FUROSEMIDE) as needed  #30 Tablet x 3   Entered by:   Lisabeth Devoid RN   Authorized by:   Hillis Range, MD   Signed by:   Lisabeth Devoid RN on 04/23/2010   Method used:   Electronically to        [facility] (retail)       [address]       [place], [place]  [id number]       Ph: [phone]       Fax: [phone]   RxID:   [id number] METOPROLOL TARTRATE 50 MG TABS (METOPROLOL TARTRATE) 2 tablets  by mouth two times a day  #60 x 6   Entered by:   Lisabeth Devoid RN   Authorized by:   Hillis Range, MD   Signed by:   Lisabeth Devoid RN on 04/23/2010   Method used:   Electronically to        [facility] (retail)       [address]       [place], [place]  [address]       Ph: [phone]       Fax: [phone]   RxID:   [id number]

## 2010-04-28 NOTE — Progress Notes (Signed)
Summary: [facility]: Office Progress Note  [facility]: Office Progress Note   Imported By: Earl Many 04/22/2010 09:13:43  _____________________________________________________________________  External Attachment:    Type:   Image     Comment:   External Document

## 2010-04-30 ENCOUNTER — Other Ambulatory Visit: Payer: Self-pay | Admitting: Radiation Oncology

## 2010-04-30 DIAGNOSIS — C539 Malignant neoplasm of cervix uteri, unspecified: Secondary | ICD-10-CM

## 2010-05-11 LAB — POCT I-STAT, CHEM 8
HCT: 44 % (ref 36.0–46.0)
Hemoglobin: 15 g/dL (ref 12.0–15.0)
Potassium: 4.8 mEq/L (ref 3.5–5.1)
Sodium: 140 mEq/L (ref 135–145)
TCO2: 23 mmol/L (ref 0–100)

## 2010-05-12 NOTE — Letter (Signed)
Summary: [facility]  [facility]   Imported By: Lennie Odor 05/06/2010 16:26:28  _____________________________________________________________________  External Attachment:    Type:   Image     Comment:   External Document

## 2010-05-13 LAB — BASIC METABOLIC PANEL
BUN: 15 mg/dL (ref 6–23)
BUN: 15 mg/dL (ref 6–23)
Calcium: 9.4 mg/dL (ref 8.4–10.5)
Calcium: 9.5 mg/dL (ref 8.4–10.5)
Calcium: 9.7 mg/dL (ref 8.4–10.5)
Calcium: 9.8 mg/dL (ref 8.4–10.5)
Creatinine, Ser: 1.14 mg/dL (ref 0.4–1.2)
Creatinine, Ser: 1.21 mg/dL — ABNORMAL HIGH (ref 0.4–1.2)
Creatinine, Ser: 1.29 mg/dL — ABNORMAL HIGH (ref 0.4–1.2)
GFR calc Af Amer: 55 mL/min — ABNORMAL LOW (ref >60)
GFR calc Af Amer: 59 mL/min — ABNORMAL LOW (ref >60)
GFR calc non Af Amer: 43 mL/min — ABNORMAL LOW (ref >60)
GFR calc non Af Amer: 44 mL/min — ABNORMAL LOW (ref >60)
GFR calc non Af Amer: 46 mL/min — ABNORMAL LOW (ref >60)
GFR calc non Af Amer: 49 mL/min — ABNORMAL LOW (ref >60)
Glucose, Bld: 105 mg/dL — ABNORMAL HIGH (ref 70–99)
Glucose, Bld: 107 mg/dL — ABNORMAL HIGH (ref 70–99)
Glucose, Bld: 111 mg/dL — ABNORMAL HIGH (ref 70–99)
Glucose, Bld: 118 mg/dL — ABNORMAL HIGH (ref 70–99)
Potassium: 4.1 mEq/L (ref 3.5–5.1)
Potassium: 4.5 mEq/L (ref 3.5–5.1)
Sodium: 137 mEq/L (ref 135–145)
Sodium: 139 mEq/L (ref 135–145)

## 2010-05-13 LAB — MAGNESIUM: Magnesium: 2.5 mg/dL (ref 1.5–2.5)

## 2010-05-14 LAB — CBC
Platelets: 237 10*3/uL (ref 150–400)
RBC: 3.94 MIL/uL (ref 3.87–5.11)
RDW: 14.2 % (ref 11.5–15.5)
WBC: 4.1 10*3/uL (ref 4.0–10.5)

## 2010-05-14 LAB — BASIC METABOLIC PANEL
BUN: 17 mg/dL (ref 6–23)
Calcium: 9.6 mg/dL (ref 8.4–10.5)
Chloride: 104 mEq/L (ref 96–112)
Creatinine, Ser: 1.24 mg/dL — ABNORMAL HIGH (ref 0.4–1.2)
GFR calc Af Amer: 54 mL/min — ABNORMAL LOW (ref >60)
GFR calc non Af Amer: 45 mL/min — ABNORMAL LOW (ref >60)

## 2010-05-15 ENCOUNTER — Other Ambulatory Visit: Payer: Self-pay | Admitting: Emergency Medicine

## 2010-05-15 ENCOUNTER — Inpatient Hospital Stay (HOSPITAL_COMMUNITY)
Admission: EM | Admit: 2010-05-15 | Discharge: 2010-05-16 | DRG: 308 | Disposition: A | Discharge status: Home / Self Care | Payer: 59 | Attending: Cardiology | Admitting: Cardiology

## 2010-05-15 ENCOUNTER — Emergency Department (HOSPITAL_COMMUNITY): Payer: 59

## 2010-05-15 ENCOUNTER — Ambulatory Visit (INDEPENDENT_AMBULATORY_CARE_PROVIDER_SITE_OTHER): Payer: 59

## 2010-05-15 ENCOUNTER — Inpatient Hospital Stay (INDEPENDENT_AMBULATORY_CARE_PROVIDER_SITE_OTHER)
Admission: RE | Admit: 2010-05-15 | Discharge: 2010-05-15 | Disposition: A | Discharge status: Home / Self Care | Payer: 59 | Source: Ambulatory Visit | Attending: Family Medicine | Admitting: Family Medicine

## 2010-05-15 DIAGNOSIS — I4891 Unspecified atrial fibrillation: Principal | ICD-10-CM | POA: Diagnosis present

## 2010-05-15 DIAGNOSIS — F3289 Other specified depressive episodes: Secondary | ICD-10-CM | POA: Diagnosis present

## 2010-05-15 DIAGNOSIS — R079 Chest pain, unspecified: Secondary | ICD-10-CM

## 2010-05-15 DIAGNOSIS — K219 Gastro-esophageal reflux disease without esophagitis: Secondary | ICD-10-CM | POA: Diagnosis present

## 2010-05-15 DIAGNOSIS — I10 Essential (primary) hypertension: Secondary | ICD-10-CM | POA: Diagnosis present

## 2010-05-15 DIAGNOSIS — I509 Heart failure, unspecified: Secondary | ICD-10-CM | POA: Diagnosis present

## 2010-05-15 DIAGNOSIS — Z923 Personal history of irradiation: Secondary | ICD-10-CM

## 2010-05-15 DIAGNOSIS — F329 Major depressive disorder, single episode, unspecified: Secondary | ICD-10-CM | POA: Diagnosis present

## 2010-05-15 DIAGNOSIS — I5033 Acute on chronic diastolic (congestive) heart failure: Secondary | ICD-10-CM | POA: Diagnosis present

## 2010-05-15 DIAGNOSIS — C539 Malignant neoplasm of cervix uteri, unspecified: Secondary | ICD-10-CM | POA: Diagnosis present

## 2010-05-15 DIAGNOSIS — M7989 Other specified soft tissue disorders: Secondary | ICD-10-CM

## 2010-05-15 DIAGNOSIS — E669 Obesity, unspecified: Secondary | ICD-10-CM | POA: Diagnosis present

## 2010-05-15 DIAGNOSIS — Z9221 Personal history of antineoplastic chemotherapy: Secondary | ICD-10-CM

## 2010-05-15 LAB — CK TOTAL AND CKMB (NOT AT ARMC)
Relative Index: INVALID (ref 0.0–2.5)
Total CK: 43 U/L (ref 7–177)

## 2010-05-15 LAB — DIFFERENTIAL
Basophils Relative: 1 % (ref 0–1)
Eosinophils Absolute: 0.1 10*3/uL (ref 0.0–0.7)
Lymphs Abs: 1.1 10*3/uL (ref 0.7–4.0)
Monocytes Relative: 14 % — ABNORMAL HIGH (ref 3–12)
Neutro Abs: 2.1 10*3/uL (ref 1.7–7.7)
Neutrophils Relative %: 55 % (ref 43–77)

## 2010-05-15 LAB — PROTIME-INR: Prothrombin Time: 14.5 seconds (ref 11.6–15.2)

## 2010-05-15 LAB — CBC
Hemoglobin: 11.8 g/dL — ABNORMAL LOW (ref 12.0–15.0)
MCH: 30.7 pg (ref 26.0–34.0)
MCV: 90.1 fL (ref 78.0–100.0)
Platelets: 183 10*3/uL (ref 150–400)
RBC: 3.84 MIL/uL — ABNORMAL LOW (ref 3.87–5.11)
WBC: 3.9 10*3/uL — ABNORMAL LOW (ref 4.0–10.5)

## 2010-05-15 LAB — POCT CARDIAC MARKERS
CKMB, poc: <1 ng/mL — ABNORMAL LOW (ref 1.0–8.0)
Troponin i, poc: <0.05 ng/mL (ref 0.00–0.09)

## 2010-05-15 LAB — POCT I-STAT, CHEM 8
BUN: 18 mg/dL (ref 6–23)
Calcium, Ion: 1.12 mmol/L (ref 1.12–1.32)
Chloride: 108 mEq/L (ref 96–112)
HCT: 35 % — ABNORMAL LOW (ref 36.0–46.0)
Potassium: 3.8 mEq/L (ref 3.5–5.1)
Sodium: 138 mEq/L (ref 135–145)

## 2010-05-15 LAB — TROPONIN I: Troponin I: 0.02 ng/mL (ref 0.00–0.06)

## 2010-05-16 DIAGNOSIS — I4891 Unspecified atrial fibrillation: Secondary | ICD-10-CM

## 2010-05-16 LAB — COMPREHENSIVE METABOLIC PANEL
BUN: 12 mg/dL (ref 6–23)
CO2: 26 mEq/L (ref 19–32)
Calcium: 9.3 mg/dL (ref 8.4–10.5)
Chloride: 107 mEq/L (ref 96–112)
Creatinine, Ser: 1.12 mg/dL (ref 0.4–1.2)
GFR calc non Af Amer: 50 mL/min — ABNORMAL LOW (ref >60)
Glucose, Bld: 106 mg/dL — ABNORMAL HIGH (ref 70–99)
Total Bilirubin: 0.6 mg/dL (ref 0.3–1.2)

## 2010-05-16 LAB — CARDIAC PANEL(CRET KIN+CKTOT+MB+TROPI)
CK, MB: 1.2 ng/mL (ref 0.3–4.0)
Relative Index: INVALID (ref 0.0–2.5)
Total CK: 43 U/L (ref 7–177)
Troponin I: 0.01 ng/mL (ref 0.00–0.06)

## 2010-05-19 NOTE — Progress Notes (Signed)
Summary: [facility]: Office Visit  [facility]: Office Visit   Imported By: Earl Many 05/08/2010 14:27:09  _____________________________________________________________________  External Attachment:    Type:   Image     Comment:   External Document

## 2010-05-22 ENCOUNTER — Other Ambulatory Visit (HOSPITAL_COMMUNITY): Payer: Self-pay | Admitting: Radiology

## 2010-05-22 ENCOUNTER — Ambulatory Visit (HOSPITAL_COMMUNITY): Payer: 59 | Attending: Internal Medicine | Admitting: Radiology

## 2010-05-22 ENCOUNTER — Other Ambulatory Visit (INDEPENDENT_AMBULATORY_CARE_PROVIDER_SITE_OTHER): Payer: 59 | Admitting: *Deleted

## 2010-05-22 DIAGNOSIS — I4891 Unspecified atrial fibrillation: Secondary | ICD-10-CM

## 2010-05-22 DIAGNOSIS — I10 Essential (primary) hypertension: Secondary | ICD-10-CM | POA: Insufficient documentation

## 2010-05-22 DIAGNOSIS — R079 Chest pain, unspecified: Secondary | ICD-10-CM | POA: Insufficient documentation

## 2010-05-22 DIAGNOSIS — R0789 Other chest pain: Secondary | ICD-10-CM

## 2010-05-22 LAB — BASIC METABOLIC PANEL
CO2: 27 mEq/L (ref 19–32)
Calcium: 9.6 mg/dL (ref 8.4–10.5)
Potassium: 4.3 mEq/L (ref 3.5–5.3)
Sodium: 139 mEq/L (ref 135–145)

## 2010-05-22 LAB — BRAIN NATRIURETIC PEPTIDE: Brain Natriuretic Peptide: 115 pg/mL — ABNORMAL HIGH (ref 0.0–100.0)

## 2010-05-22 NOTE — Discharge Summary (Addendum)
[name]                ACCOUNT NO.:  [id number]  MEDICAL RECORD NO.:  [id number]           PATIENT TYPE:  I  LOCATION:  2033                         FACILITY:  [facility]  PHYSICIAN:  [name] Ancona, MD      DATE OF BIRTH:  20-Feb-1951  DATE OF ADMISSION:  05/15/2010 DATE OF DISCHARGE:  05/16/2010                              DISCHARGE SUMMARY   DISCHARGE DIAGNOSES: 1. Atrial fibrillation with rapid ventricular response.     a.     Previously known history of atrial fibrillation, given a      trial of Tikosyn in October 2011, did not full sinus rhythm.     b.     Toprol increased this admission and diltiazem added.         I. Anticoagulated with Pradaxa. 2. Acute diastolic heart failure, gently diuresed, discharged with     daily Lasix prescription.     a.     For outpatient 2-D echocardiogram. 3. Cervical cancer. 4. Obesity. 5. Depression. 6. Hypertension.  HOSPITAL COURSE:  [name] is a 60 year old female with history of hypertension, obesity, depression and as well as atrial fibrillation and normal EF by echo 55% in June 2010, who was admitted with complaint of swelling in her legs.  She drinks 4-6 bottles of water daily because she think this would be helpful for her swelling.  Firstly, in the ER she was found to be in Afib with rapid ventricular response with heart rate in the 120s.  She was gently diuresed and rate control was added in the form of diltiazem, her metoprolol was also increased.  This morning, the patient is doing well with no complaints of dyspnea and her heart rate is more controlled.  Dr. Marca Stone has seen and examined her today and feel she is stable for discharge on the medication listed below.  DISCHARGE LABS:  WBC 3.9, hemoglobin 11.9, hematocrit 35, platelet count 183,000.  Sodium 138, potassium 4.0, chloride 107, CO2 of 26, glucose 106, BUN 12, creatinine 1.12.  LFTs were within normal limits with the exception of decreased albumin  3.4.  Cardiac enzymes negative x4.  STUDIES:  Chest x-ray May 15, 2010 showed no active cardiopulmonary disease.  DISCHARGE MEDICATIONS: 1. Diltiazem 120 mg daily. 2. Potassium chloride 20 mEq daily. 3. Lasix 40 mg daily. 4. Metoprolol tartrate 100 mg b.i.d. 5. Multivitamin tablet daily. 6. Pradaxa 150 mg b.i.d. 7. Prilosec 20 mg daily.  DISPOSITION:  [name] be discharged in stable condition to home.  She is instructed to follow a low-salt, heart-healthy diet and increase activity slowly.  She will follow with Dr. Johney Stone in the office for an echocardiogram, BMET/BNP in 7 days and a follow up appointment with Dr. Johney Stone in 10-14 days per Dr. Shirlee Latch.  Our office will call her with these appointments.  DURATION OF DISCHARGE ENCOUNTER:  Greater than 30 minutes including physician and PA time.     [name], P.A.C.   ______________________________ [name] Ancona, MD    DD/MEDQ  D:  05/16/2010  T:  05/17/2010  Job:  [id number]  cc:   [name] Fearing  Allred, MD  Electronically Signed by [name] Ancona MD on 05/22/2010 08:33:18 AM Electronically Signed by [name]  on 05/28/2010 12:18:29 PM

## 2010-05-24 LAB — BASIC METABOLIC PANEL
Chloride: 108 mEq/L (ref 96–112)
GFR calc Af Amer: 54 mL/min — ABNORMAL LOW (ref >60)
Potassium: 3.9 mEq/L (ref 3.5–5.1)

## 2010-05-24 LAB — CBC
HCT: 36.9 % (ref 36.0–46.0)
MCV: 89.5 fL (ref 78.0–100.0)
RBC: 4.12 MIL/uL (ref 3.87–5.11)
WBC: 4.2 10*3/uL (ref 4.0–10.5)

## 2010-05-27 NOTE — H&P (Signed)
[name]                ACCOUNT NO.:  [id number]  MEDICAL RECORD NO.:  [id number]           PATIENT TYPE:  LOCATION:                                 FACILITY:  PHYSICIAN:  Luis Abed, MD, FACCDATE OF BIRTH:  05-05-1950  DATE OF ADMISSION: DATE OF DISCHARGE:                             HISTORY & PHYSICAL   HISTORY OF PRESENT ILLNESS:  The patient is followed by Dr. Johney Frame for atrial fibrillation.  She has good left ventricular function.  She was given a trial of Tikosyn several months ago.  This was done in October 2011.  She converted to sinus rhythm.  However, unfortunately she did not hold sinus.  She has seen Dr. Johney Frame back in the office.  He saw her as recently as April 23, 2009.  At that time, he was continuing to adjust her meds for rate control.  It was mentioned that amiodarone might be helpful.  However, he was waiting for her to get further to have from her treatment including radiation and chemotherapy for cervical cancer.  Recently, she has not felt well.  She has noticed swelling in her legs. The left leg is more marked in the right leg.  Also today, she had some chest tightness.  She does not comment on feeling rapid palpitations. She adjusted her home dose of diuretics slightly, but felt that this was not working well.  It is of note that she drinks 4-6 bottles of water daily because she thinks that this would be helpful.  PAST MEDICAL HISTORY:  ALLERGIES:  CODEINE and MORPHINE.  MEDICATIONS:  At home; 1. She is on metoprolol tartrate 100 b.i.d. 2. Pradaxa 150 b.i.d. 3. Furosemide.  Previously it was p.r.n. 4. She is also on Prilosec and multivitamin.  OTHER MEDICAL PROBLEMS:  See the complete list below.  SOCIAL HISTORY:  The patient lives in [place].  She uses very limited alcohol.  She does not exercise much.  She quit smoking in 1986.  FAMILY HISTORY:  The patient's father is alive, having had an MI in the past.  Mother died of  lung cancer.  REVIEW OF SYSTEMS:  The patient denies fevers.  She does have hot flashes.  She denies chills.  She denies rash or skin changes.  She has not been having any significant headache.  There has been some vertigo. She is not having any significant GI symptoms or GU symptoms.  PHYSICAL EXAMINATION:  VITAL SIGNS:  Blood pressure is 121/64 with a pulse of 94.  At this time, she is on IV Cardizem.  Respirations are 18. GENERAL:  She is in no distress.  She is significantly overweight. HEENT:  Head is atraumatic.  There is no xanthelasma.  There is no jugular venous distention. LUNGS:  Clear. RESPIRATORY:  Effort is not labored. CARDIAC:  S1 and S2.  The rhythm is irregularly irregular.  There are no significant murmurs. ABDOMEN:  Soft. EXTREMITIES:  She has trace edema in the right lower extremity and 1+ edema in the left lower extremity.  LABORATORY DATA:  BUN is 18 with a creatinine 1.2.  Potassium is  3.8. Hemoglobin is 11.9.  Her INR is 1.1.  This is appropriate with her on Pradaxa.  Her point of care troponin is normal.  Chest x-ray reveals no evidence of significant active abnormalities.  EKG reveals atrial fibrillation.  When she first arrived, her rate was in the range of 120.  PROBLEMS: 1. Obesity. 2. Depression. 3. History of hypertension. 4. History of recurrent cervical cancer treated in January 2012, with     radiation and chemotherapy. 5. Normal systolic left ventricular function with an ejection fraction     of 55% to 65% by echo in June 2010. 6. We will arrange for an echo whether it be here in the hospital or     as an outpatient. 7. Current clinical complex with some increased edema, some slight     chest discomfort, and increased rapid atrial fibrillation rate.  We     have carefully considered whether we should add amiodarone at this     time.  I believe that this decision should be made by Dr. Johney Frame.     Historically, there is good LV function.  We  will try to arrange     for followup echo whether it be inpatient or outpatient.  Because     of the increased atrial fibrillation rate, I will add diltiazem to     her daily oral medications.  We will diurese her gently over the     evening.  Enzymes will be checked to rule out any suggestion of     myocardial ischemia.  Assuming her heart rate is better controlled     and her volume status is stable, she may be able to be discharged     home tomorrow for very early followup with echo and followup with     Dr. Johney Frame as an outpatient for further decisions.     Luis Abed, MD, [facility]     JDK/MEDQ  D:  05/15/2010  T:  05/15/2010  Job:  [id number]  Electronically Signed by Willa Rough MD FACC on 05/27/2010 12:18:09 PM

## 2010-05-28 ENCOUNTER — Ambulatory Visit (INDEPENDENT_AMBULATORY_CARE_PROVIDER_SITE_OTHER): Payer: 59 | Admitting: Internal Medicine

## 2010-05-28 ENCOUNTER — Encounter: Payer: Self-pay | Admitting: Internal Medicine

## 2010-05-28 DIAGNOSIS — I5033 Acute on chronic diastolic (congestive) heart failure: Secondary | ICD-10-CM

## 2010-05-28 DIAGNOSIS — I10 Essential (primary) hypertension: Secondary | ICD-10-CM

## 2010-05-28 DIAGNOSIS — I4891 Unspecified atrial fibrillation: Secondary | ICD-10-CM

## 2010-05-28 NOTE — Patient Instructions (Signed)
Your physician recommends that you schedule a follow-up appointment in: 8 weeks with Dr Johney Frame  Your physician recommends that you continue on your current medications as directed. Please refer to the Current Medication list given to you today.

## 2010-05-30 ENCOUNTER — Encounter: Payer: Self-pay | Admitting: Internal Medicine

## 2010-05-30 NOTE — Progress Notes (Signed)
The patient presents today for routine electrophysiology followup.  She was recently hospitalized for rapidly conducting afib and CHF.  She was restarted on cardizem and lasix.  She reports doing well since that time.  She continues to have an ongoing workup for recurrent cervical cancer.  She has a PET scan pending.  Today, she denies symptoms of palpitations, chest pain, shortness of breath, orthopnea, PND, lower extremity edema, dizziness, presyncope, syncope, or neurologic sequela.  The patient feels that she is tolerating medications without difficulties and is otherwise without complaint today.   Past Medical History  Diagnosis Date  . Persistent atrial fibrillation   . Cervical cancer   . HTN (hypertension)   . Depression   . Diastolic dysfunction    Past Surgical History  Procedure Date  . Total abdominal hysterectomy   . Cholecystectomy   . Ureteral stent placement 2005, 2010    Current outpatient prescriptions:diltiazem (CARDIZEM CD) 120 MG 24 hr capsule, daily. , Disp: , Rfl: ;  furosemide (LASIX) 40 MG tablet, daily. , Disp: , Rfl: ;  metoprolol (LOPRESSOR) 100 MG tablet, 2 (two) times daily. , Disp: , Rfl: ;  Multiple Vitamin (MULTIVITAMIN) tablet, Take 1 tablet by mouth daily.  , Disp: , Rfl: ;  omeprazole (PRILOSEC) 20 MG capsule, Take 20 mg by mouth daily.  , Disp: , Rfl:  potassium chloride SA (K-DUR,KLOR-CON) 20 MEQ tablet, daily. , Disp: , Rfl: ;  PRADAXA 150 MG CAPS, , Disp: , Rfl:   Allergies  Allergen Reactions  . Codeine     REACTION: Breathing issues and rash 40 years ago  . Morphine     REACTION: Hallucinations    History   Social History  . Marital Status: Single    Spouse Name: N/A    Number of Children: N/A  . Years of Education: N/A   Occupational History  . Not on file.   Social History Main Topics  . Smoking status: Never Smoker   . Smokeless tobacco: Not on file  . Alcohol Use: No  . Drug Use: No  . Sexually Active: Not on file   Other  Topics Concern  . Not on file   Social History Narrative   Lives with significant other is [place], [place].Loan processor at [employer] grown son and 2 g-sons    No family history on file.  Physical Exam: Filed Vitals:   05/28/10 1108  BP: 106/72  Pulse: 68  Height: 5\' 1"  (1.549 m)  Weight: 233 lb (105.688 kg)    GEN- The patient is well appearing, alert and oriented x 3 today.   Head- normocephalic, atraumatic Eyes-  Sclera clear, conjunctiva pink Ears- hearing intact Oropharynx- clear Neck- supple, no JVP Lymph- no cervical lymphadenopathy Lungs- Clear to ausculation bilaterally, normal work of breathing Heart- irregular rate and rhythm, no murmurs, rubs or gallops, PMI not laterally displaced GI- soft, NT, ND, + BS Extremities- no clubbing, cyanosis, or edema MS- no significant deformity or atrophy Skin- no rash or lesion Psych- euthymic mood, full affect Neuro- strength and sensation are intact

## 2010-05-30 NOTE — Assessment & Plan Note (Signed)
Recently hospitalized with diastolic CHF in the setting of afib with RVR. Rate control is important.  She has restarted lasix and appears to be doing well.

## 2010-05-30 NOTE — Assessment & Plan Note (Signed)
At goal.  No changes today.  

## 2010-05-30 NOTE — Assessment & Plan Note (Signed)
Presently better rate controlled.  She is appropriately anticoagulated with pradaxa. No changes today.  She has failed rhythm control with tikosyn and multaq.  I would be reluctant to use amiodarone. Given her ongoing struggles with cervical cancer, she is not presently a candidate for afib ablation.

## 2010-06-04 LAB — BASIC METABOLIC PANEL
BUN: 19 mg/dL (ref 6–23)
CO2: 24 mEq/L (ref 19–32)
Chloride: 109 mEq/L (ref 96–112)
Creatinine, Ser: 1.31 mg/dL — ABNORMAL HIGH (ref 0.4–1.2)
Glucose, Bld: 150 mg/dL — ABNORMAL HIGH (ref 70–99)

## 2010-06-04 LAB — CBC
MCHC: 34.7 g/dL (ref 30.0–36.0)
MCV: 91 fL (ref 78.0–100.0)
Platelets: 217 10*3/uL (ref 150–400)
RDW: 14.4 % (ref 11.5–15.5)

## 2010-06-05 ENCOUNTER — Telehealth: Payer: Self-pay | Admitting: Physician Assistant

## 2010-06-05 NOTE — Telephone Encounter (Signed)
This morning when she woke up, noticed that her legs were more swollen than usual, L_>R all the way up to her knee. She took her scheduled 40mg  Lasix at 6 a.m. W/ her normal urine output. Minimal if any erythema, no pain. She has had asymmetric edema before with her heart failure (has h/o remote knee surgery on the L). No CP or SOB. Advised pt to take an additional dose of Lasix now and monitor urine output over the next several hours. If no improvement by 3pm or worsening of symptoms, advised pt to be seen either in ER setting or urgent care for further eval. Pt is on Pradaxa so felt to be lower risk of DVT, favor diastolic CHF with her history. She expressed gratitude & understanding.

## 2010-06-08 LAB — URINALYSIS, ROUTINE W REFLEX MICROSCOPIC
Glucose, UA: NEGATIVE mg/dL
Protein, ur: 100 mg/dL — AB
Urobilinogen, UA: 0.2 mg/dL (ref 0.0–1.0)

## 2010-06-08 LAB — CBC
MCHC: 35.1 g/dL (ref 30.0–36.0)
RBC: 3.16 MIL/uL — ABNORMAL LOW (ref 3.87–5.11)

## 2010-06-08 LAB — BASIC METABOLIC PANEL
CO2: 22 mEq/L (ref 19–32)
Calcium: 9.7 mg/dL (ref 8.4–10.5)
Creatinine, Ser: 1.13 mg/dL (ref 0.4–1.2)
GFR calc Af Amer: >60 mL/min (ref >60)

## 2010-06-08 LAB — URINE MICROSCOPIC-ADD ON

## 2010-06-11 LAB — COMPREHENSIVE METABOLIC PANEL
ALT: 36 U/L — ABNORMAL HIGH (ref 0–35)
Alkaline Phosphatase: 91 U/L (ref 39–117)
CO2: 24 mEq/L (ref 19–32)
Chloride: 109 mEq/L (ref 96–112)
GFR calc non Af Amer: 49 mL/min — ABNORMAL LOW (ref >60)
Glucose, Bld: 94 mg/dL (ref 70–99)
Potassium: 4.3 mEq/L (ref 3.5–5.1)
Sodium: 139 mEq/L (ref 135–145)
Total Bilirubin: 0.6 mg/dL (ref 0.3–1.2)

## 2010-06-11 LAB — PROTIME-INR
INR: 1 (ref 0.00–1.49)
Prothrombin Time: 13.1 seconds (ref 11.6–15.2)
Prothrombin Time: 13.4 seconds (ref 11.6–15.2)

## 2010-06-11 LAB — CBC
MCHC: 33.8 g/dL (ref 30.0–36.0)
RDW: 13.4 % (ref 11.5–15.5)

## 2010-06-16 ENCOUNTER — Other Ambulatory Visit: Payer: Self-pay | Admitting: Internal Medicine

## 2010-06-16 LAB — BASIC METABOLIC PANEL
BUN: 16 mg/dL (ref 6–23)
Creatinine, Ser: 1.24 mg/dL — ABNORMAL HIGH (ref 0.4–1.2)
GFR calc non Af Amer: 45 mL/min — ABNORMAL LOW (ref >60)
Glucose, Bld: 94 mg/dL (ref 70–99)

## 2010-06-16 LAB — CBC
MCV: 89 fL (ref 78.0–100.0)
Platelets: 246 10*3/uL (ref 150–400)
RDW: 13.2 % (ref 11.5–15.5)
WBC: 4.7 10*3/uL (ref 4.0–10.5)

## 2010-06-16 LAB — URINALYSIS, ROUTINE W REFLEX MICROSCOPIC
Ketones, ur: NEGATIVE mg/dL
Leukocytes, UA: NEGATIVE
Nitrite: NEGATIVE
Urobilinogen, UA: 0.2 mg/dL (ref 0.0–1.0)
pH: 6 (ref 5.0–8.0)

## 2010-06-16 LAB — APTT
aPTT: 31 seconds (ref 24–37)
aPTT: 48 seconds — ABNORMAL HIGH (ref 24–37)
aPTT: 54 seconds — ABNORMAL HIGH (ref 24–37)

## 2010-06-16 LAB — PROTIME-INR
INR: 2.3 — ABNORMAL HIGH (ref 0.00–1.49)
Prothrombin Time: 13.3 seconds (ref 11.6–15.2)

## 2010-06-16 LAB — URINE MICROSCOPIC-ADD ON

## 2010-06-18 ENCOUNTER — Ambulatory Visit: Payer: 59 | Admitting: Internal Medicine

## 2010-06-25 ENCOUNTER — Encounter (HOSPITAL_COMMUNITY): Payer: Self-pay

## 2010-06-25 ENCOUNTER — Encounter (HOSPITAL_COMMUNITY)
Admission: RE | Admit: 2010-06-25 | Discharge: 2010-06-25 | Disposition: A | Discharge status: Home / Self Care | Payer: 59 | Source: Ambulatory Visit | Attending: Radiation Oncology | Admitting: Radiation Oncology

## 2010-06-25 DIAGNOSIS — Z9221 Personal history of antineoplastic chemotherapy: Secondary | ICD-10-CM | POA: Insufficient documentation

## 2010-06-25 DIAGNOSIS — Z923 Personal history of irradiation: Secondary | ICD-10-CM | POA: Insufficient documentation

## 2010-06-25 DIAGNOSIS — K449 Diaphragmatic hernia without obstruction or gangrene: Secondary | ICD-10-CM | POA: Insufficient documentation

## 2010-06-25 DIAGNOSIS — R160 Hepatomegaly, not elsewhere classified: Secondary | ICD-10-CM | POA: Insufficient documentation

## 2010-06-25 DIAGNOSIS — J9819 Other pulmonary collapse: Secondary | ICD-10-CM | POA: Insufficient documentation

## 2010-06-25 DIAGNOSIS — C539 Malignant neoplasm of cervix uteri, unspecified: Secondary | ICD-10-CM

## 2010-06-25 DIAGNOSIS — N269 Renal sclerosis, unspecified: Secondary | ICD-10-CM | POA: Insufficient documentation

## 2010-06-25 DIAGNOSIS — K7689 Other specified diseases of liver: Secondary | ICD-10-CM | POA: Insufficient documentation

## 2010-06-25 DIAGNOSIS — E041 Nontoxic single thyroid nodule: Secondary | ICD-10-CM | POA: Insufficient documentation

## 2010-06-25 DIAGNOSIS — I517 Cardiomegaly: Secondary | ICD-10-CM | POA: Insufficient documentation

## 2010-06-25 LAB — GLUCOSE, CAPILLARY: Glucose-Capillary: 104 mg/dL — ABNORMAL HIGH (ref 70–99)

## 2010-06-25 MED ORDER — FLUDEOXYGLUCOSE F - 18 (FDG) INJECTION
17.7000 | Freq: Once | INTRAVENOUS | Status: AC | PRN
  Administered 2010-06-25: 17.7 via INTRAVENOUS

## 2010-07-14 NOTE — Assessment & Plan Note (Signed)
[facility]                         ELECTROPHYSIOLOGY OFFICE NOTE   NAME:[name], [name]                       MRN:          [id number]  DATE:12/21/2007                            DOB:          1951-02-03    [name] comes in followup for atrial fibrillation.  She was started  on propafenone about 2 months ago.  In the last 4 weeks, she has noted a  marked diminish in her atrial fibrillation and she feels terrific in  this regard.  She has also had nocturnal leg cramps, which had then  worse she has ever had.   She is also concerned about her weight gain and looking back over the  year, she has put on 25 pounds in the last year, about 50 pounds in the  last 2 years.   CURRENT MEDICATIONS:  1. Rythmol 325 b.i.d.  2. Coumadin.  3. Atenolol 100.  4. Lisinopril 10.  5. Aspirin.  6. Prilosec.   PHYSICAL EXAMINATION:  VITAL SIGNS:  Her blood pressure was 128/90, her  pulse was 58, her weight was 270.  LUNGS:  Clear.  CHEST:  Heart sounds were regular.  ABDOMEN:  Soft.  EXTREMITIES:  Without edema.   Electrocardiogram dated today demonstrated sinus rhythm of 50 with  intervals of 0.18/0.10/0.43, the axis was right about 0, the electrogram  was otherwise normal.   IMPRESSION:  1. Paroxysmal atrial fibrillation, on Rythmol therapy with marked      decrease in her symptomatic atrial fibrillation.  2. Previous failure to tolerate flecainide.  3. Structurally normal heart.  4. Significant leg cramps, question related to Rythmol therapy.  5. Increasing weight.   [name] leg cramps may be related to the propafenone and we will  discontinue the propafenone for 2-3 weeks trial and see whether cramps  abate.  In the event that they do, we will have to ask the question if  we resume it or they are anything that we can to medicate, ending that  we can do to medicate the symptoms related to that.  Alternatively, we  can consider other  antiarrhythmic drugs, specifically Tikosyn and/or  referal for catheter ablation.   We will plan to check her thyroid today and this may also be  contributing to leg cramps and may also be reflected in her weight gain.  We will check a muscle enzyme assay as well as check her potassium,  magnesium as these may also be contributing.   We will plan to hear from her in 2 weeks' time and we will see her again  in 4 months.     Duke Salvia, MD, [facility]  Electronically Signed    SCK/MedQ  DD: 12/21/2007  DT: 12/21/2007  Job #: [id number]

## 2010-07-14 NOTE — Cardiovascular Report (Signed)
[name], [name]                ACCOUNT NO.:  [id number]   MEDICAL RECORD NO.:  [id number]          PATIENT TYPE:  AMB   LOCATION:  ENDO                         FACILITY:  [facility]   PHYSICIAN:  Bevelyn Buckles. Bensimhon, MDDATE OF BIRTH:  01/18/1951   DATE OF PROCEDURE:  DATE OF DISCHARGE:  09/20/2007                            CARDIAC CATHETERIZATION   DIRECT CURRENT CARDIOVERSION REPORT     [name] is a very pleasant 60 year old woman with recurrent atrial  fibrillation.  She has been quite symptomatic.  She was followed by Dr.  Graciela Husbands.  She was seen in the office last week and, given her symptoms,  she was scheduled for cardioversion.  However, INR was subtherapeutic at  the time at 1.3, so she was scheduled for TEE.  Her INR has subsequently  been rechecked and now is 2.   Transesophageal echocardiogram was performed and showed normal LV  function, no atrial appendage thrombus.   After appropriate sedation by anesthesia with sodium Pentothal, she  underwent a single 200 joules biphasic synchronized shock with prompt  conversion to sinus rhythm.  There are no apparent complications.      Bevelyn Buckles. Bensimhon, MD  Electronically Signed     DRB/MEDQ  D:  09/20/2007  T:  09/20/2007  Job:  [id number]

## 2010-07-14 NOTE — Assessment & Plan Note (Signed)
[facility]                         ELECTROPHYSIOLOGY OFFICE NOTE   NAME:[name]                       MRN:          [id number]  DATE:10/25/2007                            DOB:          [birth year]    [name] was seen in followup for atrial fibrillation occurring in a  structurally normal heart.  She was on flecainide and has continued to  have problems with rapid symptoms, significant fatigue, and poor  exercise tolerance.  Event recorder done to try to clarify the  mechanism.  These demonstrated both atrial flutter as well as atrial  fibrillation and a one strip very rapid wide complex tachycardia, that  has appeared to be mostly long-short initiated, so probably most  consistent with aberration.  I do not think it represents ventricular  tachycardia.   MEDICATIONS:  Her other medications in addition to flecainide 100 b.i.d.  include; atenolol 100, lisinopril 10, Coumadin, aspirin, and Prilosec.   PHYSICAL EXAMINATION:  VITAL SIGNS:  On examination today, her blood  pressure is 126/74 with a pulse of 60.  Weight was 218, which continues  to climb.  She is now 25 pounds heavier than in October.  LUNGS:  Clear.  HEART:  Sounds were regular today.  EXTREMITIES:  Without edema.   IMPRESSION:  1. Paroxysmal atrial fibrillation with rapid ventricular response.  2. Modest bradycardia.  3. Some atrial flutter with the above.  4. Wide complex tachycardia.  5. Flecainide for the above.   We discussed treatment options for ongoing symptoms.  She really is  quite limited by her symptoms with it, hence I suggest we do something  different.  As I do not think the wide complex rhythm is VT, I have  suggested that propafenone might be the easier next therapy and we have  prescribed 325 mg of the SR b.i.d. or to take 225 q.8 of the generic.  In the event that this fails, we will think either in terms of Tikosyn  therapy or referral to Dr. Johney Frame  for catheter ablation of atrial  fibrillation.      Duke Salvia, MD, [facility]  Electronically Signed    SCK/MedQ  DD: 10/25/2007  DT: 10/26/2007  Job #: [id number]

## 2010-07-14 NOTE — Consult Note (Signed)
[name], [name]                ACCOUNT NO.:  [id number]   MEDICAL RECORD NO.:  [id number]          PATIENT TYPE:  OUT   LOCATION:  GYN                          FACILITY[facility]  [facility]   PHYSICIAN:  De Blanch, M.D.DATE OF BIRTH:  March 21, 1950   DATE OF CONSULTATION:  10/04/2008  DATE OF DISCHARGE:                                 CONSULTATION   CHIEF COMPLAINT:  Recurrent cervical cancer.   INTERVAL HISTORY:  The patient has now completed external beam radiation  therapy given concurrently with cisplatin as a radiosensitizer.  She  received 6000 centigray to the left pelvis in 30 fractions.  Cisplatin  was administered on four of the six weeks of therapy but was held on two  of the weeks because of neutropenia.  The patient reports that  throughout radiation therapy she did well.  She did have her ureteral  stent replaced recently.  Of note, her left pelvic pain has completely  resolved and she is no longer on pain medicine.  She does have some  intermittent diarrhea as her only sequelae.  She tells me that she had a  followup CT scan on July 8 and according to Dr. Trina Ao note the  sidewall mass was considerably smaller, measuring only 2 x 1.5 cm.   She has no other GI or GU symptoms and her functional status is good  although she is quite anxious about her own disease status as well as  several family members and friends who have cancer as well.   HISTORY OF PRESENT ILLNESS:  Stage IB cervical carcinoma, undergoing a  radical hysterectomy, bilateral salpingo-oophorectomy, pelvic  lymphadenectomy March of 2005.  Previously she had had a ureteral stent  placed because of injury to the left ureter at the time of a cold knife  conization.  Final pathology of the radical hysterectomy specimen showed  no residual disease.  Her left stent was removed in May of 2008.  The  patient presented with left lower quadrant pain in February of 2010 and  was found to have a pelvic mass on  the left pelvic sidewall causing  severe hydronephrosis which was managed with a percutaneous nephrostomy  and then antegrade placement of ureteral stents.   PAST MEDICAL HISTORY:  1. Medical illnesses.  2. Arthritis.  3. Atrial fibrillation.  4. Depression.  5. Glaucoma.  6. Heartburn.  7. Hyperlipidemia.  8. Hypertension.   PAST SURGICAL HISTORY:  1. Cold knife conization.  2. Radical hysterectomy.  3. Bilateral salpingo-oophorectomy.  4. Pelvic and periaortic lymphadenectomy.  5. Carpal tunnel release.  6. Ureteral stent placements.  7. Tonsils and adenoidectomy.   DRUG ALLERGIES:  MORPHINE AND CODEINE.   CURRENT MEDICATIONS:  Atenolol, lisinopril, Coumadin, Prilosec, aspirin,  multivitamins.   SOCIAL HISTORY:  The patient is married.  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  She does not smoke.   REVIEW OF SYSTEMS:  A 10-point comprehensive review of systems negative  except as noted above.   PHYSICAL EXAM:  Weight 214 pounds, blood pressure 122/70.  GENERAL:  The patient is a healthy  but anxious white female in no acute  distress.  HEENT:  Negative.  NECK:  Supple without thyromegaly.  There is no supraclavicular or  inguinal adenopathy.  ABDOMEN:  Soft, nontender.  No mass, organomegaly, ascites or hernias  noted.  PELVIC:  EG, BUS, vagina, bladder, urethra are normal.  Cervix and  uterus are surgically absent on bimanual and rectovaginal exam.  I do  not feel any masses, although there is some tenderness on the left  pelvic sidewall.   IMPRESSION:  Delayed recurrence of cervical cancer on the left pelvic  sidewall.  The patient seems to be having an excellent response based on  elimination of her pelvic pain and improvement on a CT scan.   PLAN:  Reassured the patient that things have gone well and we will plan  on repeating the CT scan in approximately 2 months and return to see me  at that time.      De Blanch, M.D.   Electronically Signed     DC/MEDQ  D:  10/04/2008  T:  10/04/2008  Job:  [id number]   cc:   Lennis P. Darrold Span, M.D.  Fax: [phone]   Billie Lade, M.D.  Fax: [phone]   Telford Nab, R.N.  [address]  [place], [place] [address]   Courtney Paris, M.D.  Fax: [phone]   Caffie Damme, MD

## 2010-07-14 NOTE — Op Note (Signed)
[name], [name]                ACCOUNT NO.:  [id number]   MEDICAL RECORD NO.:  [id number]          PATIENT TYPE:  AMB   LOCATION:  [facility]                         FACILITY:  [facility]   PHYSICIAN:  Courtney Paris, M.D.DATE OF BIRTH:  December 08, 1950   DATE OF PROCEDURE:  08/23/2008  DATE OF DISCHARGE:                               OPERATIVE REPORT   PREOPERATIVE DIAGNOSIS:  Left hydronephrosis, recurrent cancer of the  cervix.   POSTOPERATIVE DIAGNOSIS:  Left hydronephrosis, recurrent cancer of the  cervix.   OPERATION:  Cystoscopy, exchange left ureteral stent.   ANESTHESIA:  General.   SURGEON:  Courtney Paris, M.D.   BRIEF HISTORY:  This 60 year old white female presented in March with  acute left-sided pain.  She had marked hydronephrosis and a large pelvic  wall abscess.  I tried to put a stent up from below but could not do so.  She had a percutaneous nephrostomy later converted to an antegrade stent  done by radiology in [place].  Biopsy of this mass showed recurrent  cervical carcinoma and she finished chemotherapy and radiation.  She  enters now for stent change.  She had a T1B cervical cancer in 2005.  She had a conization in January of that year with a left ureteral injury  treated with a left percutaneous nephrostomy in February of 2005.  She  had hysterectomy and bilateral salpingo-oophorectomy in March of 2005  and the stent was removed in May of 2005.  In August of 2005 she had no  hydronephrosis on her renal ultrasound when she was lost to followup  until March of this year.  She did have some left sciatic pain from the  mass pressing on her sciatic nerve but this has improved since radiation  and chemo.  She has atrial fibrillation and has been on Coumadin but  stopped this 5 days ago.   The patient was placed on the operating table in dorsal lithotomy  position and after satisfactory induction of general anesthesia was  prepped and draped with Betadine  in the usual sterile fashion.  She was  on Ancef and gentamicin IV preoperatively and had been on Cipro for a  week.  Time-out was then performed and the patient and procedure then  reconfirmed.  Panendoscope was inserted and the bladder inspected.  There were a lot of polyps and inflammatory tissue near the left orifice  and the stent was seen coming out of this.  This was grasped with  forceps and pulled outside the urethral meatus.  The end was cut off  with scissors and a guidewire was able to be passed up under fluoroscopy  to the level of the kidney as the old stent was then removed.  Over the  guidewire a new 7 French x 24 cm length double-J ureteral stent was  passed under fluoroscopy.  When the guidewire was removed there was a  nice coil in the renal bladder and one in the pelvis.  Cystoscope was  reintroduced and the tube pulled out slightly to  adjust it.  Bladder drained, scope removed.  The  patient taken to the  recovery room in good condition.  Because she was allergic to morphine  and codeine she was not given a B and O suppository and will have the  stent changed in 3 months.      Courtney Paris, M.D.  Electronically Signed     HMK/MEDQ  D:  08/23/2008  T:  08/23/2008  Job:  [id number]

## 2010-07-14 NOTE — Assessment & Plan Note (Signed)
[facility]                         ELECTROPHYSIOLOGY OFFICE NOTE   NAME:Aspinall, ERCIA CRISAFULLI                       MRN:          [id number]  DATE:03/13/2008                            DOB:          1950/11/22    Ms. Welsch is seen in followup for atrial fibrillation, paroxysmal.  She  is having significant night cramps which turned out to be related to  Rythmol which she was able to demonstrate it by stopping it and starting  at a couple of times.  She also noted that her atrial fibrillation is  not any particularly worse off the Rythmol than it was on, although she  is much better than she was 6 months ago.   Her medications currently include Prilosec, aspirin, lisinopril,  atenolol, and Coumadin.   PHYSICAL EXAMINATION:  VITAL SIGNS:  Her blood pressure is 122/85 with a  pulse of 84.  LUNGS:  Clear.  CARDIAC:  Heart sounds were regular.  EXTREMITIES:  No edema.   IMPRESSION:  1. Paroxysmal atrial fibrillation.  2. Thromboembolic risk factors notable for:      a.     Hypertension.      b.     Gender.  3. Intolerance to flecainide and Rythmol with the latter causing      cramping.   We will plan at this point to hold steady.  We will discontinue her  aspirin as she is on Coumadin.  We will see her again in 6 months' time.  In the event if she has more symptoms, considerations would be Tikosyn  or pulmonary vein isolation.     Duke Salvia, MD, [facility]  Electronically Signed    SCK/MedQ  DD: 03/13/2008  DT: 03/14/2008  Job #: [id number]

## 2010-07-14 NOTE — Assessment & Plan Note (Signed)
[facility]                         ELECTROPHYSIOLOGY OFFICE NOTE   NAME:Soileau, CASSI JENNE                       MRN:          [id number]  DATE:08/10/2006                            DOB:          Sep 12, 1950    Ms. [name] comes in.  Her event recorder demonstrated frequent episodes  of really rapid atrial fibrillation with rates over 200.  She continues  to have symptoms of shortness of breath, fatigue, and lightheadedness.   We have started her on Toprol.  She found this intolerable and  discontinued it.  She is on aspirin 81.   PHYSICAL EXAMINATION:  VITAL SIGNS:  Her blood pressure today is 120/72  with a pulse of 68.  LUNGS:  Clear.  CARDIAC:  Heart sounds were regular.  EXTREMITIES:  Without edema.   IMPRESSION:  1. Atrial fibrillation with a rapid ventricular response, paroxysmal.  2. Intolerance to Toprol.   Will give prescriptions for beta blockers, nadolol 40, atenolol 50, and  Inderal LA 80.  Once she finds a beta blocker that she can tolerate, we  will begin her on flecainide 50 mg twice daily.  We discussed the  potential benefits as well as potential risks, including life-  threatening pro arrhythmia from flecainide.  She is agreeable to  proceed.  She will need a stress test 3-4 days after initiation, looking  for pro arrhythmia.  We discussed the use of this in the context of  maintenance of sinus rhythm and the hopes down the road of atrial-  fibrillation curative procedure.   She understands and is willing to proceed.     Duke Salvia, MD, [facility]  Electronically Signed    SCK/MedQ  DD: 08/10/2006  DT: 08/11/2006  Job #: [id number]
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
## 2010-07-14 NOTE — Assessment & Plan Note (Signed)
[facility]                         ELECTROPHYSIOLOGY OFFICE NOTE   NAME:[name], [name]                       MRN:          [id number]  DATE:12/01/2006                            DOB:          [birth year]    [name] is seen.  She is doing much better on the Flecainide in terms  of her paroxysmal atrial fibrillation.  She has occasional brief  episodes of palpitations but neither long nor significantly symptomatic.   She is tolerating the atenolol 50 mg.  She also takes Flecainide 50  b.i.d. as well as soon as possible.   EXAMINATION:  Her blood pressure is 1115/90.  Her pulse is 65.  LUNGS:  Clear.  HEART SOUNDS:  Regular.  EXTREMITIES:  Without edema.   Her weight is up about 15 pounds over the last 6 months.   IMPRESSION:  Paroxysmal atrial fibrillation, holding mostly sinus rhythm  on Flecainide.   [name] is doing really well.  We will plan to see her again in 6  months' time.     Duke Salvia, MD, [facility]  Electronically Signed    SCK/MedQ  DD: 12/01/2006  DT: 12/02/2006  Job #: [id number]

## 2010-07-14 NOTE — Assessment & Plan Note (Signed)
[facility]                         ELECTROPHYSIOLOGY OFFICE NOTE   NAME:[name], [name]                       MRN:          [id number]  DATE:07/11/2006                            DOB:          04-09-1950    [name] is seen. She has had recurrent tachy palpitations. I saw her  about a year and a half ago. We tried her on a variety of beta blockers  and then she did not return for followup.   She has had atrial fibrillation documented and the one thing we see from  2006 was quite slow. She is now having rapid tachy palpitations that are  occurring really very frequently, although she is in the context of her  father dying from a massive stroke.   These episodes are associated with significant variation in heart rate  from 40-170s, they are associated with profound presyncope and inability  to stand.   CURRENT MEDICATIONS:  1. Aspirin 81.  2. Prilosec.   PHYSICAL EXAMINATION:  VITAL SIGNS:  Her blood pressure is 122/66, her  pulse is 81.  LUNGS:  Clear.  HEART:  Heart sounds were regular.  EXTREMITIES:  Without edema.   Electrocardiogram  date today demonstrated sinus rhythm at 81 with  intervals of 0.14/0.09/0.40, the axis was 22. There was T wave  flattening.   Long strip monitoring demonstrated an isolated PAC.   IMPRESSION:  1. History of atrial fibrillation.  2. Palpitations with significant heart rate variation.  3. Question in the past of a supraventricular tachycardia.  4. Thromboembolic risk factors broadly negative.   We will plan to get [name] a CardioNet monitor and will see her  again in 3-4 weeks' time.     Duke Salvia, MD, [facility]  Electronically Signed    SCK/MedQ  DD: 07/11/2006  DT: 07/11/2006  Job #: [id number]

## 2010-07-14 NOTE — Assessment & Plan Note (Signed)
[facility]                         ELECTROPHYSIOLOGY OFFICE NOTE   NAME:[name], [name]                       MRN:          [id number]  DATE:09/12/2007                            DOB:          1950/05/25    [name] is seen because she is not feeling very well, with  increasing fatigue and shortness of breath.  This has been associated  with some degree of palpitations, although not nearly as strongly as she  had had previously with her atrial fibrillation.  She has also been  under extreme stress at work, working 12-14 hours a day.   She has had some problems with lightheadedness as well.  There has been  no peripheral edema.   CURRENT MEDICATIONS:  1. Flecainide 50 b.i.d.  2. Atenolol 50.  3. Aspirin.  4. Prilosec.   PHYSICAL EXAMINATION:  VITAL SIGNS:  Her blood pressure is 149/96, and  her pulse is 82.  Her weight was 215 pounds, which is up 20 pounds in  the last 9 months and up more than 35 pounds in the last 11 months.  NECK:  Neck veins were flat.  Her carotids were brisk and full  bilaterally without bruits.  BACK:  Without kyphosis or scoliosis.  LUNGS:  Clear.  HEART:  Heart sounds were irregular but not all that rapid.  ABDOMEN:  Soft.  EXTREMITIES:  Just trace edema.   Orthostatic measurements were also taken and were notable primarily for  diastolic blood pressures in the 100-120 range.  Heart rates were  obviously not well counted by the machine.   Electrocardiogram dated today demonstrated atrial fibrillation at 95  beats per minute with intervals - 0.09/0.37.  Axis was 15 degrees.   IMPRESSION:  1. Atrial fibrillation - persistent - recurrent.  2. Shortness of breath and exercise intolerance and edema likely      related to atrial fibrillation.  3. Previous flecainide therapy for atrial fibrillation.  4. CHADS score of 0, currently on aspirin.  5. Progressive weight gain.  6. Hypertension, particularly diastolic -  new change in her CHADS      score from 0 to 1.   [name] has atrial fibrillation which is quite symptomatic at this  time.  We discussed different treatment alternatives, both of which  included cardioversion either with or without 3-4 weeks of pretreatment  versus TEE.  Given her significant symptoms, we have elected to do the  latter.  She will be begun on Coumadin today, and she was apprised of  the potential risks of bleeding.  She understands and is willing to  proceed.  We will plan to get her INR checked next week and TEE will  follow next week.  In anticipation of that, we will plan to increase her  flecainide from 50 to 100 mg twice daily.   Further, we will plan to increase her atenolol from 50 to 100 mg to  augment rate control, and I have also given her a prescription for  lisinopril 10 mg a day to take for blood pressure control and,  hopefully, reduction of  her frequency of atrial fibrillation.   We discussed further whether she might have sleep apnea, but as best as  I can tell that does not seem to be an active issue at this time.     Duke Salvia, MD, [facility]  Electronically Signed    SCK/MedQ  DD: 09/12/2007  DT: 09/13/2007  Job #: [id number]
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
## 2010-07-14 NOTE — Procedures (Signed)
[facility]                              EXERCISE TREADMILL   NAME:[name], KEALY LEWTER                       MRN:          [id number]  DATE:10/19/2006                            DOB:          03-Jan-1951    Ms. Hodgens was admitted for flecainide treatment test. She does not know  her dose. She achieved maximal exertion which was a modification of  stage III of 1 minute stages. Her peak heart rate was 120 representing  about 75-80% of her predicted maximum heart rate. There was a little bit  of atrial ectopy. There was no ventricular ectopy.   We will plan to see her again in three months' time.     Duke Salvia, MD, [facility]  Electronically Signed    SCK/MedQ  DD: 10/19/2006  DT: 10/20/2006  Job #: [id number]

## 2010-07-17 NOTE — H&P (Signed)
[name], [name]                ACCOUNT NO.:  [id number]   MEDICAL RECORD NO.:  [id number]          PATIENT TYPE:  INP   LOCATION:  MAJO                         FACILITY:  [facility]   PHYSICIAN:  Charlton Haws, M.D.     DATE OF BIRTH:  January 30, 1951   DATE OF ADMISSION:  08/14/2004  DATE OF DISCHARGE:                                HISTORY & PHYSICAL   PRIMARY CARE PHYSICIAN:  Dr. Jeanie Sewer and she also sees Dr. __________  urgent care.   PRIMARY CARDIOLOGIST:  Doylene Canning. Ladona Ridgel, M.D.   CHIEF COMPLAINT:  Palpitations.   HISTORY OF PRESENT ILLNESS:  [name] is a 60 year old female with no  known history of coronary artery disease.  She had been evaluated by Dr.  Ladona Ridgel for paroxysmal atrial fibrillation, in 2005, that was associated with  urosepsis.  Today at work, she was moving boxes and therefore, exerting  herself more than usual.  She felt hot and then felt a sudden onset of  lightheadedness that was associated with her heart pounding and racing.  She  rested but got no relief, although her symptoms decreased some.  She had not  eaten, so she ate a little and drank some water but her symptoms did not  resolve.  She was taken to urgent care where she said at that time she had a  headache and some orthostatic dizziness.  Her heart rate was in the 130s,  and she was transported to [facility].  After IV Cardizem, her  heart rate is approximately 90.  She had no chest pain with the palpitations and says she never gets chest  pain.  She was a little short of breath when her heart rate was very fast  and said she had a slight choking feeling as well.  She has a history of  tachy palpitations that she gets 3-4 times a year.  She says they generally  last only a few seconds and have lasted up to 2-3 minutes but never more  than five minutes until today.  She said normally they resolve  spontaneously.  She is not aware of anything that she does that brings them  on or anything  that she does that makes them go away.   PAST MEDICAL HISTORY:  1.  She said she had a lipid profile approximately four months ago and was      told her cholesterol was a little high and she needed to make some      dietary changes.  2.  She has no history of hypertension, diabetes, family history of coronary      artery disease, or tobacco use.  3.  She has a history of paroxysmal tachy palpitations that are described in      previous notes as supraventricular tachycardia and PAF.  4.  She has a history of a conization which led to urosepsis.  5.  She has a history of cervical cancer.   SURGICAL HISTORY:  1.  She is status post radical hysterectomy.  2.  Suprapubic catheter.  3.  Ureteral stents.  4.  Laparoscopic cholecystectomy.  5.  She has had a bilateral tubal ligation.  6.  Breast biopsy.  7.  Tonsillectomy.   ALLERGIES:  1.  CODEINE.  2.  MORPHINE.   MEDICATIONS:  Premarin and vitamins.   SOCIAL HISTORY:  She lives in [place] with her significant other, named  [name].  She works at __________  [employer].  She has less than a five-  year pack history of tobacco use and quit in 1987.  She does not abuse  alcohol or drugs.  She drinks approximately three cups of coffee a day and  has no other source of caffeine intake.   FAMILY HISTORY:  Her mother died at age 70 of lung cancer but [name]  states she had a history of coronary artery disease also.  Her father is  alive at age 81 with a history of diabetes but no heart disease.  There is  no history of heart disease or heart rhythm problems that she is aware of in  any other relatives.   REVIEW OF SYSTEMS:  Significant for presyncope associated with palpitations  as described above.  She has had no recent urinary problems.  She has no  hematemesis, hemoptysis, or melena.  She has no recent illness.  Review of  systems is otherwise negative.   PHYSICAL EXAMINATION:  VITAL SIGNS:  Temperature is 98.3, blood  pressure  128/90, heart rate was 135 is now 92, and respiratory rate is 21.  O2  saturation is 95% on 2 liters.  GENERAL:  She is a well-developed, well-nourished, white female in no acute  distress.  HEENT:  Her head is normocephalic and atraumatic.  Pupils are equal, round,  and reactive to light and accommodation.  Extraocular movements intact.  Sclerae are clear.  Nares without discharge.  NECK:  There is no lymphadenopathy, thyromegaly, bruit, or JVD noted.  CV:  Her heart is irregular in rate and rhythm with an S1 S2 and possibly an  S4 but no significant murmur or rub is noted.  Her distal pulses are 2+ and  no femoral bruits are appreciated.  LUNGS:  Clear to auscultation bilaterally.  SKIN:  No rashes or lesions are noted.  ABDOMEN:  Soft and nontender with active bowel sounds.  EXTREMITIES:  There is no cyanosis, clubbing, or edema.  MUSCULOSKELETAL:  There is no joint deformity or effusion and no spine or  CVA tenderness.  NEURO:  She is alert and oriented with cranial nerves II-XII grossly intact.   Chest x-ray is pending at the time of dictation.   EKG is rate of 132 in atrial fibrillation with no acute ischemic changes.   LABORATORY VALUES:  Hemoglobin 14.0, hematocrit 40.4, WBC 5.2, platelets  215.  INR is 0.9, PTT 27.  Sodium 140, potassium 4.0, chloride 106, CO2 28,  BUN 11, creatinine 1.0, glucose 94, calcium 9.8.  TSH and free T4 are  pending at the time of dictation.   ASSESSMENT/PLAN:  1.  Atrial fibrillation with rapid ventricular response.  We will initially      start her on intravenous Cardizem for rate control.  If she tolerates      this well, we will change her to oral medication this evening and      hopefully transition to daily medication in the next day or so.  We will      also start her on heparin and Coumadin per pharmacy.  If thyroid      function studies  are abnormal, endocrine consult will be called.  If she     does not spontaneously convert,  we will bring her back for direct      current cardioversion, after she has been therapeutic on Coumadin for 4-      6      weeks.  2.  The patient is otherwise stable.   Dr. Charlton Haws saw the patient and determined the plan of care.      Hermenia Bers   RB/MEDQ  D:  08/14/2004  T:  08/15/2004  Job:  [id number]

## 2010-07-17 NOTE — Group Therapy Note (Signed)
NAME:  [name], [name] NO.:  [id number]   MEDICAL RECORD NO.:  [id number]                   PATIENT TYPE:  EMS   LOCATION:  [facility]                           FACILITY:  [facility]   PHYSICIAN:  Argentina Donovan, MD                     DATE OF BIRTH:  22-Jan-1951   DATE OF SERVICE:                                    CLINIC NOTE   SUMMARY:  The patient is a 60 year old gravida 2 para 1-0-1-1 who underwent  cervical cone biopsy on March 25, 2003 after a Pap smear revealed squamous  cell carcinoma.  She had not had a previous cytology done in 10 years.  This  patient had been in good health.  The cone biopsy was difficult because of  the adhesive nature of the cervix being almost attached to a portion of the  left vaginal wall so that suturing the cervix post conization was difficult.  During the procedure there was a question of whether or not I may have put a  suture into the bladder and so I catheterized the bladder to see if there  was any sign of blood and none could be seen.  On March 27, 2003 the  patient's husband called because the patient had intractable feeling of cold  and was, to quote him, talking out of her head.  I had her come into the  maternity admissions office where her vital signs were stable.  She was  complaining of a copious vaginal discharge and unable to control her urine.  We put a Foley catheter in which was draining clear amber urine and the  vagina was examined, the cervix seemed to be healing well, there was not  much bleeding; however, there was a copious amount of fluid which may  indicate a fistula formation that might have been secondary to the  procedure.  I discussed this with the husband.  We left the Foley catheter  in and I was going to place her on Macrobid prophylaxis until we saw her in  the office.  In the interim she began to feel quite physically bad.  The  vital signs were taken and the patient had a 210 pulse rate  which on EKG  looked like supraventricular tachycardia.  She was treated with 6 mg of  adenosine which brought her pulse rate down to approximately 115-120.  We  consulted with [facility] emergency room and she was sent by [facility] over  there where the only follow-up I have at this point is that she was treated,  evaluated, and released.  She is to return to our office here for  postoperative follow-up in 1 week.  The pathology report was returned which  showed a maximum tumor size of 14 mm horizontal by 4 mm thickness with a  squamous cell carcinoma with histology of moderate differentiated cells.  The margins were free  of tumor with the closest margin being 0.8 mm from  deep margin.  Depth of invasion was 4 mm where the cone is 4.8 mm in  thickness.  The pathology report will be FAXed over to Dr. Marcene Corning-  Pearson's office and hopefully he will see her in follow-up and follow  through with whatever treatment he deems necessary.                                               Argentina Donovan, MD    PR/MEDQ  D:  03/28/2003  T:  03/28/2003  Job:  [id number]

## 2010-07-17 NOTE — H&P (Signed)
NAMEARCADIA, [name]                          ACCOUNT NO.:  [id number]   MEDICAL RECORD NO.:  [id number]                   PATIENT TYPE:  INP   LOCATION:  0464                                 FACILITY:  [facility]   PHYSICIAN:  Tanya S. Shawnie Pons, M.D.                DATE OF BIRTH:  [birth year]   DATE OF ADMISSION:  05/15/2003  DATE OF DISCHARGE:  05/19/2003                                HISTORY & PHYSICAL   CHIEF COMPLAINT:  Cervical cancer.   HISTORY OF PRESENT ILLNESS:  The patient is a 60 year old gravida 2, para 1-  0-1-1 who has cervical cancer on a cold knife cone and is here for  definitive treatment.   PAST MEDICAL HISTORY:  1. SCT.  2. Ureteral obstruction following cold knife cone requiring stent placement.   PAST SURGICAL HISTORY:  1. Laparoscopic cholecystectomy.  2. Bilateral tubal ligation.  3. Multiple benign breast biopsies.  4. Tonsillectomy and adenoidectomy.  5. Cold knife cone.  6. Stent placement.   OBSTETRICAL HISTORY:  She is a G 2, P 1-0-1-1.  She had one STD.   GYNECOLOGICAL HISTORY:  Last Pap was approximately 14 years ago.   MEDICATIONS:  None.   ALLERGIES:  CODEINE.   FAMILY HISTORY:  Noncontributory.   SOCIAL HISTORY:  The patient is married she denies current tobacco and  alcohol use.  She stopped smoking several years ago.   A 14-point review of systems is reviewed and negative.   PHYSICAL EXAMINATION:  VITAL SIGNS:  Her weight is 174 pounds, blood  pressure is 120/80, pulse 80, respirations 16.  GENERAL:  She is a well-developed, well-nourished white female in no acute  distress.  HEENT:  Benign.  Sclerae anicteric.  NECK:  Supple.  LUNGS:  Clear bilaterally.  CARDIOVASCULAR:  Regular rate and rhythm.  No rubs, gallops, or murmurs.  ABDOMEN:  Soft, nontender, nondistended.  There is no ascites, mass, or  organomegaly.  EXTREMITIES:  Full strength and range of motion.  NEUROLOGICAL:  Nonfocal.  PELVIC:  She has normal external female  genitalia.  The vagina is mildly  atrophic.  The cervix is healing.  The uterus is normal and is nontender.  The parametrium was clear.   LABORATORY DATA:  Hemoglobin is 12.7 otherwise the CBC is within normal  limits.  Chemistries also within normal limits.  Note a creatinine of 0.9.   IMPRESSION:  Cervical cancer likely IB1.   PLAN:  Radical hysterectomy, lymph node excision, and suprapubic catheter  placement.  She is admitted on the day prior to her surgery.                                               Shelbie Proctor. Shawnie Pons, M.D.    TSP/MEDQ  D:  06/23/2003  T:  06/23/2003  Job:  [id number]

## 2010-07-17 NOTE — Op Note (Signed)
NAME:  [name], [name]                          ACCOUNT NO.:  [id number]   MEDICAL RECORD NO.:  [id number]                   PATIENT TYPE:  AMB   LOCATION:  DAY                                  FACILITY[facility]  [facility]   PHYSICIAN:  Claudette Laws, M.D.               DATE OF BIRTH:  [birth year]   DATE OF PROCEDURE:  07/11/2003  DATE OF DISCHARGE:                                 OPERATIVE REPORT   PREOPERATIVE DIAGNOSES:  1. History of a left ureteral injury, status post antegrade insertion of a     double-J stent on April 02, 2003.  2. Status post radical hysterectomy with lymphadenopathy and oophorectomy on     May 15, 2003.   POSTOPERATIVE DIAGNOSES:  1. History of a left ureteral injury, status post antegrade insertion of a     double-J stent on April 02, 2003.  2. Status post radical hysterectomy with lymphadenopathy and oophorectomy on     May 15, 2003.   OPERATION:  1. Cystoscopy, removal of double-J stent, and attempted left retrograde     pyeloureterogram.  2. Right retrograde pyeloureterogram.   SURGEON:  Claudette Laws, M.D.   HISTORY:  This is a 60 year old lady, who underwent a cervical conization at  the end of January this year and subsequently sustained an injury to the  distal left ureter.  I had seen her about 4 days later, and she appeared to  have a left hydronephrosis with left ureteral extravasation.  The left  nephrostomy tube was placed, and then a double-J stent was placed on  April 02, 2003, by Dr. Charlett Nose.  The patient has tolerated the stent  well and subsequently underwent the radical hysterectomy, as mentioned  above.  The patient was in my office a few days ago for follow-up and  consideration of removal of the stent.  Initially, she underwent an IVP in  the office.  She had normal right collecting system.  Left side showed a  mild to moderate left hydronephrosis, but there appeared to be contrast in  the distal ureter.  I suspect that the  hydronephrosis was both from the  stent obstruction and possibly edema of the left ureteral orifice.  On  cystoscopy in the office, she did have a considerable amount of bullous  edema, as was noted during her initial left nephrostomy tube placement.   DESCRIPTION OF PROCEDURE:  The patient was prepped and draped in the dorsal  lithotomy position under LMA anesthesia.  Cystoscopy confirmed a normal  bladder except for the indwelling left ureteral stent and a fair amount of  bullous edema around the left hemitrigone.  There were no tumors, no  calculi.  Interestingly, the right ureteral orifice was situated almost  across the midline.  This was intubated with an open-end ureteral catheter,  and a retrograde study was performed, showing a normal right collecting  system.  Initially,  I attempted to intubate the left ureter around the stent  but because of technical reasons and the acute angle present, this was not  possible.  I removed the stent part way and was still unable to intubate the  ureter.  Then I went ahead and removed the double-J stent and, as expected,  it was very difficult to identify the left ureteral orifice because of the  edema.  We then injected indigo carmine, and I could see rather prompt  efflux of the right ureteral orifice.  After a few minutes, then we did see  efflux from the left hemitrigone but, again, because of the edema, it was  difficult to identify the exact  site.  I tried several catheters and guidewires but never was I able to  intubate the left ureter.  At this point, it was felt all we could do was  empty the bladder and see how she gets along.  A B&O suppository was placed  per rectum for anesthetic purposes, and the patient was taken back to the  PACU in satisfactory condition.                                               Claudette Laws, M.D.    RFS/MEDQ  D:  07/11/2003  T:  07/11/2003  Job:  [id number]

## 2010-07-17 NOTE — Consult Note (Signed)
NAME:  [name], [name]                          ACCOUNT NO.:  [id number]   MEDICAL RECORD NO.:  [id number]                   PATIENT TYPE:  OUT   LOCATION:  GYN                                  FACILITY:  [facility]   PHYSICIAN:  John T. Kyla Balzarine, M.D.                 DATE OF BIRTH:  Dec 05, 1950   DATE OF CONSULTATION:  04/23/2003  DATE OF DISCHARGE:                                   CONSULTATION   CHIEF COMPLAINT:  This 60 year old woman is seen at the request of Dr. Okey Dupre  for recommendations regarding management of cervical carcinoma.   HISTORY OF PRESENT ILLNESS:  The patient relates a 13-14 year interval in  between Pap smears.  A Pap smear was obtained in December 2004 and she had  noted intermittent postcoital bleeding.  Examination at that time revealed a  small area of friability on the anterior lip of the cervix.  Because of high-  grade cytology, she underwent cervical conization on March 25, 2003.  Final pathology revealed an invasive squamous cell carcinoma of the cervix  measuring 14 mm in horizontal extent x 4 mm thick.  Margins of the  conization were negative and there was no angiolymphatic involvement.  The  conization was described as somewhat difficult because of retraction of the  cervix to the left fornix.   The patient was admitted on January 28 with complaints of fevers and chills.  At the time of her conization there was concern that a suture might have  been placed through the bladder.  The patient presented to the [facility]  Emergency Room on March 29, 2003 with tachycardia and hypotension.  The  patient was evaluated with a CT scan which revealed small calculi in the  right and left kidneys with left moderate hydronephrosis.  No stone was  identified within the distal left ureter, and there was stranding in the  left pelvis in the region of the distal ureter lateral to the bladder.  No  mass or lymphadenopathy was seen.  Percutaneous nephrostomy and subsequent  double-J stenting was performed.  Blood cultures did grow out gram-negative  rods and the patient was treated with antibiotics.  She had paroxysmal  atrial fibrillation during her gram-negative rod sepsis, but it was elected  not to treat this invasively, and symptoms resolved.   PAST MEDICAL HISTORY:  1. Rare episodes of paroxysmal tachycardia.  2. She is status post laparoscopic cholecystectomy.  3. Remote tonsillectomy and adenoidectomy.  4. Bilateral tubal ligation.  5. She has had multiple benign breast biopsies.  6. NSVD x1.   MEDICATIONS:  Currently none.   ALLERGIES:  CODEINE.   FAMILY HISTORY:  Noncontributory.   PERSONAL AND SOCIAL HISTORY:  The patient is married and denies ethanol use.  She stopped smoking several years ago.   REVIEW OF SYSTEMS:  Otherwise negative.  Specifically with no chronic GI or  GU  complaints other than as noted above.  No  pathologic lymphadenopathy,  back pain, or leg swelling.  The patient has no intrinsic cardiopulmonary  disease other than as noted above.   PHYSICAL EXAMINATION:  VITAL SIGNS:  Weight 174 pounds, blood pressure  120/80.  Vital signs otherwise stable as recorded in the flow sheet.  GENERAL:  The patient is alert and oriented x 3, in no acute distress.  HEENT:  Benign with clear oropharynx.  LUNGS:  Lung fields are clear.  HEART:  Heart sounds regular rhythm without gallop or murmur.  There is no  JVD.  ABDOMEN:  Soft and benign without ascites, mass, or organomegaly.  There is  no abdominal tenderness.  There is no pathologic lymphadenopathy and no  groin adenopathy.  EXTREMITIES:  Full strength and range of motion.  NEUROLOGIC:  Neurologic screen is nonfocal.  PELVIC:  External genitalia and BUS are normal to inspection and palpation.  The vagina is mildly atrophic with a healing cone bed.  The upper fornices  are abbreviated with a very small left fornix.  Bimanual and rectovaginal  examinations reveal normal uterus  with no tenderness with uterine mobility.  There is minimal thickening adjacent to the left fornix, but I believe that  this is likely a reaction to prior sutures.  There is no parametrial  nodularity suspicious for disease and no sidewall mass or nodularity.   LABORATORY DATA:  I reviewed the CT scan of the abdomen and pelvis obtained  on January 28, confirming no pathologic lymphadenopathy.   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  Left ureteral obstruction likely secondary to reaction  from cervical conization, stented.   PLAN AND RECOMMENDATIONS:  I had a long discussion with the patient  detailing options of radiation therapy or primary surgery for her early  stage I-B squamous carcinoma of the cervix.  There would be anticipated to  be identical cure rates with these modalities of treatment, but different  side effects, including long term potential for complications of the  radiation therapy.  We discussed the potential for long term bladder  dysfunction following radical hysterectomy, risks of potential ureteral or  GI injury, DVT, hemorrhage, and other potential complications.  The patient  wishes a primary surgical approach which, I believe, would be best.  I would  like her to use vaginal estrogen at least twice weekly until surgery is  scheduled in approximately one month.  This will allow healing of the cone  bed, resolution of changes at the base of the bladder and upper vagina, and  would improve vascularity of the vagina.  Preoperative clearance will be  scheduled by Telford Nab and surgery coordinated with Dr. Okey Dupre.   I discussed management of the stent with Dr. Etta Grandchild and we are agreed that  the stent should remain in place until the patient has completed  convalescence from radical hysterectomy.  I discussed this plan thoroughly  with the patient and answered multiple questions; she wishes to proceed.                                                John T. Kyla Balzarine, M.D.    JTS/MEDQ  D:  04/24/2003  T:  04/24/2003  Job:  [id number]   cc:   Michele Mcalpine D. Okey Dupre, M.D.  Fax:  [phone]   Claudette Laws, M.D.  [address]  [place]  [place] [address]  Fax: [phone]   Telford Nab, R.N.  [phone] [address]  [place], [place] [address]   Doylene Canning. Ladona Ridgel, M.D.

## 2010-07-17 NOTE — Assessment & Plan Note (Signed)
[facility]                                 ON-CALL NOTE   NAME:STALEYChia, Mowers                       MRN:          [id number]  DATE:10/09/2007                            DOB:          1950/12/22    I received a page to the answering service from [place] [facility] [place] [employer],  [phone].  She stated that Ms. Barno was wearing a heart monitor  and had had approximately 1 minute of wide complex tachycardia with  ongoing atrial fibrillation prior to and after.  I tried to reach Ms.  Keahey at [phone] and discussed with her the heart rhythm and see if  she was symptomatic.  I was unable to reach her.  I did leave a message  for her to call me back.  I did not hear from her prior to my call time  ending.  I spoke with Tresa Endo, Dr. Odessa Fleming nurse today and make sure she  was aware of the ectopy and that the appropriate rhythm strips have been  faxed to the office.  Tresa Endo states she would look into this and touch  base with Leota Sauers after she spoke with Dr. Graciela Husbands.      Dorian Pod, ACNP  Electronically Signed      Bevelyn Buckles. Bensimhon, MD  Electronically Signed   MB/MedQ  DD: 10/09/2007  DT: 10/10/2007  Job #: [id number]

## 2010-07-17 NOTE — Group Therapy Note (Signed)
NAME:  [name], [name]                          ACCOUNT NO.:  [id number]   MEDICAL RECORD NO.:  [id number]                   PATIENT TYPE:  OUT   LOCATION:  [facility]                           FACILITY:  [facility]   PHYSICIAN:  Tinnie Gens, MD                     DATE OF BIRTH:  05-03-1950   DATE OF SERVICE:  10/17/2003                                    CLINIC NOTE   CHIEF COMPLAINT:  Follow-up after surgery.   HISTORY OF PRESENT ILLNESS:  The patient is a 60 year old gravida 2 para 1  who is status post radical hysterectomy for stage Ib1 cervical cancer in  January 2005.  Her pathology revealed no residual cancer and she was last  seen in May by Dr. Kyla Balzarine.  She has been started on Premarin for her hot  flashes but no calcium or vitamin D.  The patient denies any vaginal  bleeding or irritation.   PHYSICAL EXAMINATION TODAY:  VITAL SIGNS:  She is afebrile, her vital signs  are stable.  GENERAL:  She is a well-developed, well-nourished white female in no acute  distress.  GENITOURINARY:  Reveals somewhat atrophic external genitalia.  The vagina is  pink and rugated.  The cuff is clean.  On bimanual, no mass or nodularity.   IMPRESSION:  Cervical cancer Ib1 status post radical hysterectomy in January  2005.   PLAN:  Repeat Pap today.  She will follow up with Dr. Kyla Balzarine in 3 months and  with me in 6.                                               Tinnie Gens, MD    TP/MEDQ  D:  10/17/2003  T:  10/17/2003  Job:  [id number]   cc:   Ronita Hipps  Fax: [phone]

## 2010-07-17 NOTE — Op Note (Signed)
NAMEJACQUILYN, SELDON                          ACCOUNT NO.:  [id number]   MEDICAL RECORD NO.:  [id number]                   PATIENT TYPE:  INP   LOCATION:  0445                                 FACILITY:  [facility]   PHYSICIAN:  John T. Kyla Balzarine, M.D.                 DATE OF BIRTH:  Jul 01, 1950   DATE OF PROCEDURE:  05/15/2003  DATE OF DISCHARGE:                                 OPERATIVE REPORT   PREOPERATIVE DIAGNOSIS:  Stage 1B carcinoma of the cervix, status post cone  biopsy with left ureteral obstruction.   POSTOPERATIVE DIAGNOSIS:  Stage 1B carcinoma of the cervix, status post cone  biopsy with left ureteral obstruction.   OPERATION/PROCEDURE:  Radical hysterectomy and bilateral salpingo-  oophorectomy with bilateral pelvic and common iliac lymphadenectomies.   SURGEON:  1. John T. Kyla Balzarine, M.D.  2. Shelbie Proctor. Shawnie Pons, M.D.   ANESTHESIA:  General endotracheal anesthesia.   FINDINGS AND INDICATIONS FOR SURGERY:  Examination under anesthesia revealed  residual parametrial thickening adjacent to the cervix in the left  parametrium and submucosally in the anterior vagina.  On exploratory  laparotomy, the left retroperitoneal spaces were extremely reactive and  indurated.  An indwelling ureteral stent was present on the left side.  There were no pathologically enlarged lymph nodes.  Appendix and other upper  abdominal organs were normal with the exception of violin-string adhesions  between the liver and the right diaphragm.  The patient did have filmy  adhesions between the posterior uterus, right ovary and both sidewall and  sigmoid colon.  During the course of hysterectomy, the anterior cervix/upper  vagina fragmented at approximately the level of the anterior fornix and we  removed an additional 1-2 cm of anterior vagina.  There was no gross  residual disease in the cervix.   DESCRIPTION OF PROCEDURE:  The patient was prepped and draped in the low  lithotomy position after examination  under anesthesia the findings as  described above. The patient was explored through a Pfannenstiel incision  made three fingerbreadths above the symphysis and developed with scalpel and  electrocautery.  The fascia was opened transversely and superior and  inferior fascial flaps developed.  The peritoneum was entered in the  midline.  Intra-abdominal exploration revealed the findings as described  above.  The bowel was packed out of the pelvis and adhesions between adnexa  and posterior uterus to the pelvic structures were lysed.  The uterus was  grasped at its cornua with long Kelly clamps.  Round ligaments bilaterally  were divided with electrocautery.  The pararectal and paravesical spaces  were developed with sharp and blunt dissection and the ureters identified on  either side.  Each infundibulopelvic ligament was isolated above the ureter,  crossclamped, divided and ligated with free-tie and suture ligature.  All  sutures and ligatures were 2-0 Vicryl unless otherwise specified.   The right infundibulopelvic ligament was hemoclipped for  additional  hemostasis.  The superior vesicle artery on each side was isolated and  retracted anteriorly.  The uterine artery on each side was then identified  and skeletonized.  The proximal uterine artery was clipped with two  hemoclips at its origin and divided.  The cardinal ligament on each side was  transected with a reticulating stapling device, approximately 2 cm lateral  to the ureter.  Each ureter was then mobilized off the medial leaf of the  broad ligament, beginning approximately 3 cm from entry into the ureteric  tunnel.  Using sharp and blunt dissection, each ureter was skeletonized to  its point of entry into the tunnel and each tunnel was dilated with blunt  dissection.  On the left side all tissues were markedly indurated and  reactive.  Medial leaf of the broad ligament below the level of the ureter  was incised posteriorly to the  cardinal ligaments.  The rectovaginal space  was developed with sharp and blunt dissection and uterosacral ligaments were  skeletonized and then divided with the reticulating stapler, approximately 2  cm posterior to the cervix.  Using sharp and blunt dissection, the bladder  was mobilized off the lower uterine segment, cervix and upper vagina.  The  ureterovesical space was extremely reactive and indurated, particularly on  the patient's left side.   During the course of dissection, the anterior cervix partially avulsed at  approximately the level of the anterior fornix, producing a defect in the  vagina.  The vaginal angles were then clamped and specimen amputated,  removing a 2 cm margin of vagina.  On inspection of the specimen, the  specimen had avulsed right at the anterior fornix.  Additional sharp and  blunt dissection was used to advance the bladder off the anterior vagina and  an additional true marginal vaginal tissue was taken anteriorly,  approximately 1-2 cm thick (on stretch).   The vagina was then closed longitudinally using locked running sutures of 0  Vicryl.  Additional hemostasis was achieved where necessary with  electrocautery, particularly in the parametrial tissues that had been  clamped with a vaginal angle.   Bilateral pelvic lymphadenectomies were performed using sharp and blunt  dissection to strip all lymphatic tissue anterior and medial to the external  iliac artery and vein, and from the crotch of the bifurcation of the common  iliac artery.  The vein retractor was used to elevate the vessels and all  obturator lymph nodes anterior to the nerve were resected on each side using  sharp and blunt dissection and electrocautery for hemostasis.  The  retroperitoneal dissection was continued cephalad on each side and distal  common iliac lymph nodes were harvested, anterior and lateral to the distal half of the common iliac artery using sharp and blunt dissection  with  electrocautery for hemostasis.  Hot packs were placed in the retroperitoneal  spaces and hemostasis assured before closing the abdomen.  All sponges and retractors were removed and the pelvis copiously irrigated.  Additional hemostasis was achieved where necessary with electrocautery and  figure-of-eight suture of 2-0 Vicryl in the right parametrial tissue.   A cystotomy was made in the dome of the bladder and a Silastic catheter  passed through a stab incision two fingerbreadths above the symphysis.  The  cystotomy was defect was closed with a pursestring suture of 2-0 Vicryl and  the catheter secured to the skin with 2-0 silk.  All sponges and retractors  were removed and the abdominal wall  was closed in layers, irrigating between  layers using 0 Vicryl in a running suture to close the fascia and skin clips  to close the skin.  The patient tolerated the procedure well and was  returned to the recovery room in stable condition.  Estimated blood loss was  650 mL.  Transfusions:  None.  Drains, bags, etc:  Foley and suprapubic  catheters.  Sponge, needle and instrument counts were correct.  Pathology  specimen:  Uterus and cervix were without the vagina, right and left tubes  and ovaries, right and left extrailiac, obturator, and common iliac lymph  nodes.   ADDENDUM:  In addition to the dissection described above, it should be noted  that each ureter was unroofed from its tunnel using a right angle retractor  and that the anterior vesicouterine ligaments were crossclamped, divided and  suture ligated.  Following this maneuver, the ureters were rolled laterally  using sharp dissection to advance the ureters off of the parametrial  tissues.  Parametrial tissues were later incorporated into the lateral  pedicles when the specimen was amputated.                                               John T. Kyla Balzarine, M.D.    JTS/MEDQ  D:  05/15/2003  T:  05/16/2003  Job:  [id number]   cc:    Shelbie Proctor. Shawnie Pons, M.D.  Fax: [phone]   Phil D. Okey Dupre, M.D.  Fax: [phone]   Telford Nab, R.N.  [address]  [place], [place] [address]

## 2010-07-17 NOTE — Consult Note (Signed)
NAME:  [name], [name]                          ACCOUNT NO.:  [id number]   MEDICAL RECORD NO.:  [id number]                   PATIENT TYPE:  INP   LOCATION:  3106                                 FACILITY:  [facility]   PHYSICIAN:  Claudette Laws, M.D.               DATE OF BIRTH:  05-Nov-1950   DATE OF CONSULTATION:  03/29/2003  DATE OF DISCHARGE:                                   CONSULTATION   REFERRING PHYSICIAN:  Sherin Quarry, M.D.   CHIEF COMPLAINT:  Blocked kidney.   HISTORY OF PRESENT ILLNESS:  This 60 year old married white female underwent  a cervical conization at [facility] four days ago.  This was performed  by Dr. Okey Dupre.  Apparently, she went home that day, developed some pain,  returned the next day and had a Foley catheter placed.  Since then, she has  gotten progressively worse according to the family.  She was seen at [facility] yesterday and then transferred here to the care of the  Hospitalists.  A CT scan was performed showing a mild left hydronephrosis  and some nonspecific stranding in the paravesical area and around the  uterus.  This was a noncontrast CT. We are not sure as to the etiology of  the dilated ureter.  The patient had a Foley catheter in now draining  grossly clear urine. There was a question of a nicked bladder according to  the gynecologist.  I have personally not spoken with Dr. Okey Dupre as yet.  I saw  the patient in the [facility] ER and went over the CT scan with the  radiologist and the family.  I am not sure exactly what is going on with  this patient.  She has had fever and chills for the last two to three days.  However, her white count if 5000, her BUN and creatinine were normal with a  creatinine of 1.1.  Her urine was clear.  I reviewed the CAT scan with the  radiologist and we thought the next best scan might be to perform a  cystogram to rule out extravasation and then followed by an IVP to see if we  can define more of a  definitive ureteral injury.  If it is felt that indeed  there is ureteral obstruction from a stitch or even from bladder swelling or  paravesical injury, we would put in a percutaneous nephrostomy for now.   REVIEW OF SYSTEMS:  The patient does have carcinoma of the cervix.  Following the biopsy, it was recommended that she have a consultation with  the physician at [facility].   ALLERGIES:  CODEINE.   PHYSICAL EXAMINATION:  GENERAL:  She appears in moderate distress,  complaining of generalized abdominal pain.  VITAL SIGNS:  Blood pressure 94/58, heart rate 99, respirations 20, pulse  oximetry 98%.  ABDOMEN:  A lot of generalized tenderness and guarding.  No  specific  organomegaly that I can tell.  PELVIC:  Deferred.  She does have a Foley catheter in draining grossly clear  urine.   Incidentally, two days ago, the patient was put on Macrobid as well as  Lorazepam 2 mg and also she was put on Diltiazem for rapid heart rate.   DISPOSITION:  Further disposition pending the above workup.                                               Claudette Laws, M.D.   RFS/MEDQ  D:  03/29/2003  T:  03/30/2003  Job:  [id number]

## 2010-07-17 NOTE — Group Therapy Note (Signed)
[name], [name]                ACCOUNT NO.:  [id number]   MEDICAL RECORD NO.:  [id number]          PATIENT TYPE:  WOC   LOCATION:  [facility]                   FACILITY:  [facility]   PHYSICIAN:  Kathlyn Sacramento, M.D.   DATE OF BIRTH:  05-Jun-1950   DATE OF SERVICE:  01/14/2005                                    CLINIC NOTE   CHIEF COMPLAINT:  Patient is here for a Pap smear.   HISTORY OF PRESENT ILLNESS:  Patient had surgery done in March of 2004.  She  is status post a radical hysterectomy for stage IB1 cervical cancer January  of __________.  Patient is here for just a routine follow-up Pap smear.  She  says she has had no problems including bleeding.  She does state she is  having some dysuria and increased urinary urgency with decreased flow.   PHYSICAL EXAMINATION:  VITAL SIGNS:  Temperature 97.8, pulse 60, blood  pressure 125/83, weight 170.5, height 5 feet 2 inches.  GENERAL:  Well-developed, well-nourished female in no acute distress.  GENITOURINARY:  She has normal external genitalia.  Her vaginal mucosa is  pink and rugated.  She has no cervix, but does have a vaginal cuff.  Pap  smear was done of the vaginal cuff.  Patient had no mass noted by bimanual  examination.   ASSESSMENT/PLAN:  1.  History of cervical cancer.  Pap smear was done today.  2.  Dysuria, urinary urgency.  A  UA was done and we will call the patient      if suggestive of a urinary tract infection.           ______________________________  Kathlyn Sacramento, M.D.     AC/MEDQ  D:  01/14/2005  T:  01/15/2005  Job:  [id number]

## 2010-07-17 NOTE — Discharge Summary (Signed)
NAME:  [name], [name]                          ACCOUNT NO.:  [id number]   MEDICAL RECORD NO.:  [id number]                   PATIENT TYPE:  INP   LOCATION:  3729                                 FACILITY:  [facility]   PHYSICIAN:  Melissa L. Ladona Ridgel, MD               DATE OF BIRTH:  Mar 07, 1950   DATE OF ADMISSION:  03/29/2003  DATE OF DISCHARGE:  04/04/2003                                 DISCHARGE SUMMARY   ADMISSION DIAGNOSES:  1. Fever and delirium.  2. Incontinent.  3. Supraventricular tachycardia.   DISCHARGE DIAGNOSES:  1. Escherichia coli sepsis.  2. Obstructed ureter status post double J stent placement.  3. Cervical cancer, being treated, status post conization.  4. Elevated liver enzymes.   DISCHARGE MEDICATIONS:  Cipro 250 mg on p.o. b.i.d. x5 more days.   HISTORY OF PRESENT ILLNESS:  The patient is a 60 year old white female who  recently was diagnosed with squamous cell carcinoma of the cervix.  She  underwent conization of the cervix at [facility].  The procedure  unfortunately was complicated by possible suture encroachment on the left  ureter.  She presented to the hospital with fever, chills and delirium.  She  was transferred to [facility] for further evaluation. She was found  to have a Escherichia coli bacteremia which initially was treated with  ampicillin, gentamycin and Flagyl.  A CT scan of the abdomen revealed  inflammatory changes in the left pelvic area with dilation of the left  ureter.  A nephrostomy tube was placed temporarily to divert urine after  which she underwent a double J stent placement which was successful in  bypassing an area of narrowing of the left ureter.  Her Foley was  discontinued on the day prior to discharge and she was able to void  effectively without urinary retention.  Her Escherichia coli sepsis was  treated with Cipro and Flagyl effectively.  Repeat blood cultures reveal no  further Escherichia coli growing.  She  remained afebrile for several days  prior to discharge.  During her hospitalization she was noted to have  episodes of SVT which was evaluated by cardiology.  At this time it was  determined that her symptoms should be managed symptomatically.  We will  request that the patient inform her surgeon prior to her next surgical  procedure about the SVT in case they should wish to institute a beta blocker  prophylactically.  Overall the patient's recovery was smooth without further  complications.  At the time of discharge she was voiding effectively without  Foley.  She was ambulating without team.  Of note, her liver enzymes were  slightly elevated during the course of the hospitalization and should be  followed as an outpatient.  Those current values are, total bilirubin of  0.3, direct bilirubin of less than 0.3.  Her alkaline phosphatase is 169,  slightly elevated.  Her SGOT  is 32.  SGPT is 55 which is slightly elevated.  Her total protein is 6.6 and albumin 2.8. Her discharge BUN and creatinine  are 9 and 0.9.  Her potassium prior to discharge is 4.6.  Her hemoglobin and  hematocrit on discharge were 11.9 and 34.4.  During the course of this  hospitalization Dr. Okey Dupre has visited and been apprised of the patient's  progress.  She has been cared for by the urological consultants who will  follow up with her as an outpatient.   SUMMARY:  This is a 60 year old female status post Escherichia coli sepsis  being treated for complication of conization of the cervix which was  entrapment of her left ureter.  She now has a double J stent which is  effectively  allowing her to pass urine through her bladder.  She will followup as an  outpatient with Dr. Okey Dupre and his designated surgical contact to further  evaluate and manage her cervical cancer.  At the time of discharge the  patient is deemed stable.                                                Melissa L. Ladona Ridgel, MD    MLT/MEDQ  D:   04/04/2003  T:  04/04/2003  Job:  [id number]   cc:   Javier Glazier. Okey Dupre, M.D.  Fax: [phone]   Claudette Laws, M.D.  [address]  [place]  [place] [address]  Fax: [phone]   Doylene Canning. Ladona Ridgel, M.D.

## 2010-07-17 NOTE — Consult Note (Signed)
NAME:  [name], MAPP                          ACCOUNT NO.:  [id number]   MEDICAL RECORD NO.:  [id number]                   PATIENT TYPE:  OUT   LOCATION:  GYN                                  FACILITY:  [facility]   PHYSICIAN:  John T. Kyla Balzarine, M.D.                 DATE OF BIRTH:  10/17/1950   DATE OF CONSULTATION:  DATE OF DISCHARGE:                                   CONSULTATION   CHIEF COMPLAINT:  Postoperative followup after radical hysterectomy.   HISTORY OF PRESENT ILLNESS:  The patient presented with a friable tumor on  the anterior lip of the cervix and underwent cervical conization in January  2005 with final pathology revealing stage IB-1 cervical carcinoma. She  subsequently developed obstruction of the left ureter requiring stent and  eventually underwent a radical hysterectomy with BSO and bilateral pelvic  and common iliac lymphadenectomies on May 15, 2003. Postoperative  pathology revealed no residual cancer in any of her specimens. She had  relatively prompt normalization of bladder function with removal of her  suprapubic catheter. Her left ureteral stent was left in place and it is  anticipated that this will be removed later this week. The patient does note  urinary urgency and frequency, but denies dysuria. The patient has not yet  instituted intercourse, but otherwise has returned to full activity, is  driving and walking without limitations. Bladder functions are otherwise  normal.   PAST MEDICAL HISTORY:  Significant for paroxysmal tachycardia, multiple  benign surgeries, and gynecologic surgeries as above.   MEDICATIONS:  None.   ALLERGIES:  CODEINE.   FAMILY/PERSONAL/SOCIAL HISTORY:  Unremarkable.   PHYSICAL EXAMINATION:  VITAL SIGNS: Weight 178 pounds. Vital signs stable  and afebrile.  GENERAL: The patient is alert and oriented times three; in no acute  distress. There is no pathologic lymphadenopathy.  ABDOMEN: Soft and benign with a well healed  transverse incision without  hernia, ascites, mass, or tenderness. There is no incisional hernia.  BACK: The back and CVA regions are nontender.  EXTREMITIES: Without edema.  PELVIC EXAM: External genitalia and BUS are without lesions. The vagina is  well supported without mucosal lesions. Speculum examination reveals a  healing cuff. Bimanual and rectovaginal examination reveal absence uterus  and cervix without mass or nodularity.   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 Left ureteral obstruction with indwelling ureteral  stent.   PLAN:  Management will be at the discretion of urology. The patient is  currently asymptomatic in regard to hot flashes or vaginal dryness and does  not wish hormonal replacement. I recommended that we institute an  alternating followup between physicians on the teaching service and our  service, seeing the patient initially at three-month intervals, and then  after the first year of followup seen for every six months, obtaining pelvic  and complete examination at each  visit along with cytology from the vaginal  apex. We tentatively scheduled a return appointment with Dr. Shawnie Pons on October 17, 2003.                                               John T. Kyla Balzarine, M.D.    JTS/MEDQ  D:  07/09/2003  T:  07/09/2003  Job:  [id number]   cc:   Shelbie Proctor. Shawnie Pons, M.D.  Fax: [phone]   Phil D. Okey Dupre, M.D.  Fax: [phone]   Claudette Laws, M.D.  [address]  [place]  [place] [address]  Fax: [phone]   Doylene Canning. Ladona Ridgel, M.D.   Telford Nab, R.N.  [address]  [place], [place] [address]

## 2010-07-17 NOTE — H&P (Signed)
NAME:  [name], Stone                          ACCOUNT NO.:  [id number]   MEDICAL RECORD NO.:  [id number]                   PATIENT TYPE:  INP   LOCATION:  3103                                 FACILITY:  [facility]   PHYSICIAN:  Sherin Quarry, MD                   DATE OF BIRTH:  1950/09/06   DATE OF ADMISSION:  03/29/2003  DATE OF DISCHARGE:                                HISTORY & PHYSICAL   HISTORY OF PRESENT ILLNESS:  [name] is a previously healthy 60-year-  old lady who reportedly had not had a Pap smear for many years.  Her husband  states that she went to [facility] for a breast biopsy in December and had  a Pap smear done there; this showed evidence of cervical carcinoma.  On  Monday, she underwent a cervical conization to further characterize the  nature of the problem.  Dr. Javier Glazier. Rose reports that this was a difficult  procedure, as the cervix was adherent to the left vaginal wall.  In his note  he comments that he was concerned that in the process of suturing the wound  to produce hemostasis, he might have placed a suture through the bladder.  After the procedure, her husband states that she had been very weak and  unsteady during the remainder of the day.  Since that time, she has not had  any bowel function.  On Wednesday, the patient seemed to be more confused  and somnolent than she had been.  She knew her family members but would  often say things that suggested that she was experiencing auditory or visual  hallucinations.  Her husband believes that she had fevers, chills and  tachypnea, both Wednesday and Thursday night.  On March 27, 2003, she saw  Dr. Okey Dupre with reports of copious vaginal discharge and inability to control  her bladder; a Foley catheter was placed at that time.  He reports that he  did a thorough vaginal exam and could not see any abnormalities, except that  there was drainage of fluid from the vaginal area.  At that time, he  expressed  concern about a possible vesicovaginal fistula.  Apparently, later  that day, the patient developed a supraventricular tachycardia with a heart  rate up to 210; she was treated with Adenocard 6 mg and her heart rate came  down to 110.  The results came back on the biopsy, confirming that she did  have an extensive cervical cancer.  Apparently, the ultimate plan is going  to be to manage this with a surgical procedure to be done at [facility].  Last night, her husband became extremely concerned about her because she was  very confused, tachypneic, was running a fever and was diaphoretic; she was  therefore brought to the [facility] Emergency Room.  At that time, the  patient's blood pressure was reported to  be 99/53 with a heart rate of 109  and occasional PACs.  Studies obtained included a urinalysis which showed 11  to 20 rbc's as well as large blood.  It should be pointed out that patient  has had a catheter in her bladder since her visit with Dr. Okey Dupre.  Potassium  is 3.4, glucose 132, creatinine 1.1.  Her chest x-ray showed evidence of  basilar atelectasis.  A CT scan of the head was negative.  The white count  was 7200, platelet count was 138,000.  The patient will be admitted at this  time for investigation of sepsis syndrome with possible pelvic abscess,  possible vesicovaginal fistula.  Prior to writing her admitting orders, I  discussed her case with the radiologist and we decided that the initial x-  ray study should be a CT scan of the abdomen and pelvis done without either  IV or oral contrast, then, if it was unclear what the nature of the problem  was, the studies would be repeated, utilizing both IV and oral contrast,  which will take a more extended period of time.  It also appears that it  will be necessary to involve urology.   PAST MEDICAL HISTORY:   ALLERGIES:  She is allergic to CODEINE.   MEDICATIONS:  She takes no medications chronically.   ILLNESSES:  She  apparently has had no significant medical illnesses in the  past.  She very seldom goes to a doctor.  Her husband feels that she has no  history of hypertension, stroke, diabetes, coronary artery disease, etc.   OPERATION:  She has had a previous tubal ligation.  She had a minor muscular  injury to her leg from falling off a horse that had to be sutured.  She had  a breast biopsy and she has also had the above-mentioned cervical  conization.   FAMILY HISTORY:  Family history is noncontributory.   SOCIAL HISTORY:  She lives with her husband.  She is normally physically  active and has no disability.  She has not smoked for many years.  She does  not use alcohol or drugs.   REVIEW OF SYSTEMS:  She denies headache.  She says she has had no visual  blurring, earache or sinus pain.  She says she has not been experiencing any  coughing, wheezing or chest pain.  Her husband reports she has had  intermittent tachypnea.  She has not been eating.  She has not been  vomiting.  She has had no bowel function.  She has had chronic complaints of  lower quadrant abdominal pain.  She has a Foley catheter in place.  There  has been no history of seizure or stroke.  There is no history of excessive  thirst, urinary frequency, nocturia, heat or cold intolerance, skin or hair  changes.   PHYSICAL EXAMINATION:  GENERAL:  Physical exam reveals a lethargic lady who  sleeps unless stimulated.  When she is stimulated, she will awaken and will  answer questions appropriately.  VITAL SIGNS:  Currently, her blood pressure is 105/57, pulse is 98,  respirations 20, O2 saturation is 100%.  HEENT:  Exam is within normal limits.  CHEST:  The chest is clear.  Breath sounds are somewhat diminished at the  bases.  CARDIOVASCULAR:  Exam reveals moderate tachycardia.  Occasional irregular  heart beat is noted.  There are no rubs, murmurs or gallops. ABDOMEN:  The abdomen reveals diminished bowel sounds.  There is  diffuse  moderate tenderness in the lower quadrants, both right and left.  I do not  believe there is any guarding or rebound.  NEUROLOGIC/EXTREMITIES:  Neurologic testing and examination of extremities  are normal.   IMPRESSION:  1. Fever, tachypnea and tachycardia, suspect sepsis syndrome.  2. Mental confusion x2 days probably secondary to #1.  3. Status post cervical conization, possible bladder injury, possible     vesicovaginal fistula.  4. History of breast biopsy and tubal ligation.   PLAN:  We will admit the patient to either ICU or stepdown unit.  As I  previously mentioned, after discussion with the radiologist, we are going to  do a CT scan of the abdomen without oral or IV contrast; then if no insights  can be reached from doing this procedure, the CT scan will be done with IV  and oral contrast.  Probably, it will be necessary to get urology involved  in this patient's case because of possible bladder injury.  The patient will  be given vigorous IV hydration and potassium will be repleted.  Electrolytes  and renal function will be closely monitored.  The patient will be given  broad-spectrum IV antibiotics; I think Flagyl and ciprofloxacin will be a  good choice.  Blood and urine cultures will be carefully monitored.                                                Sherin Quarry, MD    SY/MEDQ  D:  03/29/2003  T:  03/30/2003  Job:  [id number]   cc:   Javier Glazier. Okey Dupre, M.D.  Fax: [phone]

## 2010-07-17 NOTE — Consult Note (Signed)
[name], [name]                          ACCOUNT NO.:  [id number]   MEDICAL RECORD NO.:  [id number]                   PATIENT TYPE:  INP   LOCATION:  3729                                 FACILITY:  [facility]   PHYSICIAN:  Doylene Canning. Ladona Ridgel, M.D.               DATE OF BIRTH:  [birth year]   DATE OF CONSULTATION:  04/02/2003  DATE OF DISCHARGE:                                   CONSULTATION   REQUESTING PHYSICIAN:  Dr. Efraim Kaufmann L. Ladona Ridgel (not related to me) with the  [facility] service.   HISTORY OF PRESENT ILLNESS:  The patient is a 60 year old woman with a  history of tachy-palpitations and probable SVT by history in the past.  She  was admitted to the hospital after a cervical biopsy demonstrated cervical  cancer and the procedure was complicated by development of an intra-  abdominal abscess and gram-negative rod sepsis.  She also developed  obstruction with left hydronephrosis and underwent nephrostomy tube  placement.  While she was quite ill with her gram-negative rod sepsis, the  patient developed recurrent tachy-palpitations and documented atrial  fibrillation with a rapid ventricular response.  She also had sinus  tachycardia.  Her atrial arrhythmias have improved.  The patient's past  history regarding palpitations is notable for brief episodes occurring once  or twice per year.  This would start suddenly and be associated with rapid  regular palpitations.  They have never been documented with an EKG but her  history in the past has been thought to be most consistent with SVT.  While  in the hospital, with her being quite ill, the patient did have paroxysmal  atrial fibrillation which has now resolved.  She denies a history of chest  pain or shortness of breath.  There is a remote history of syncope in the  past.   PAST MEDICAL HISTORY:  Past medical history is notable for a tubal ligation  and breast biopsy in the past.   FAMILY HISTORY:  The family history is  unremarkable.   SOCIAL HISTORY:  The patient lives with her husband in [place].  She denies  tobacco use or ethanol use, stopping smoking in the past.   REVIEW OF SYSTEMS:  Review of systems is noted per Chinita Pester, C.R.N.P.  The pertinent positives include fevers, chills, diaphoresis.  While she was  critically ill, she did have a history and abdominal pain.  She does have  palpitations as previously documented.  The rest of her review of systems  was negative.  Please see Diane Sauro's note regarding these.   PHYSICAL EXAMINATION:  GENERAL:  On exam, she is a pleasant middle-aged  woman in no acute distress.  VITAL SIGNS:  Blood pressure was 132/70.  The pulse was 83 and regular.  The  respirations were 18.  HEENT:  Normocephalic and atraumatic.  The pupils were equal and round.  Oropharynx was moist.  The sclerae were anicteric.  NECK:  The neck revealed no jugular venous distention.  The carotids were 2+  and symmetric.  The trachea was midline.  CARDIOVASCULAR:  Exam revealed a regular rate and rhythm with normal S1 and  S2.  There were no murmurs, rubs, or gallops.  LUNGS:  The lungs were clear bilaterally to auscultation.  ABDOMEN:  The abdominal area was tender in the lower region near the pelvis.  There was no flank tenderness.  EXTREMITIES:  The extremities demonstrated no cyanosis, clubbing or edema.  The joints showed no effusions.  NEUROLOGIC:  Cranial nerves II-XII were grossly intact and the strength was  5/5 and symmetric.  Otherwise, her neurologic exam was nonfocal.   ACCESSORY CLINICAL DATA:  EKG demonstrates normal sinus rhythm with no  evidence of ventricular preexcitation.  Additional EKGs demonstrated atrial  fibrillation with a rapid ventricular response.   IMPRESSION:  1. History of tachy-palpitations in the past with history most likely     secondary to supraventricular tachycardia.  2. Paroxysmal atrial fibrillation in the setting of gram-negative  rod     sepsis.  3. Gram-negative rod sepsis.  4. Abdominal abscess with ureteral obstruction, now markedly improved.   RECOMMENDATIONS:  Recommendations at the time would be a period of watchful  waiting with regard to her cardiac symptoms.  I suspect her atrial  fibrillation was related to her being critically ill and the high  catecholamine state that she was in at the time.  If her symptoms recur,  then initial IV digoxin would be a consider, unless her blood pressure will  tolerate IV beta blocker, which also would be an option.  Ultimately,  treatment of her cervical cancer will continue to be a priority for this  patient.                                               Doylene Canning. Ladona Ridgel, M.D.    GWT/MEDQ  D:  04/02/2003  T:  04/03/2003  Job:  [id number]   cc:   Sherin Quarry, MD   Javier Glazier. Okey Dupre, M.D.  Fax: [phone]   Kathrine Cords, R.N. [facility]   Melissa L. Ladona Ridgel, MD  [address] [place], [place] [address]  Fax: [phone]
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
## 2010-07-17 NOTE — Op Note (Signed)
NAME:  [name], [name]                          ACCOUNT NO.:  [id number]   MEDICAL RECORD NO.:  [id number]                   PATIENT TYPE:  AMB   LOCATION:  SDC                                  FACILITY:  [facility]   PHYSICIAN:  Phil D. Okey Dupre, M.D.                  DATE OF BIRTH:  07-18-1950   DATE OF PROCEDURE:  03/25/2003  DATE OF DISCHARGE:                                 OPERATIVE REPORT   PROCEDURE:  Cold knife conization of the cervix.   PREOPERATIVE DIAGNOSIS:  Squamous cell carcinoma of the cervix.   POSTOPERATIVE DIAGNOSIS:  Pending pathology report.   PROCEDURE IN DETAIL:  Under satisfactory general anesthesia, with the  patient in the dorsal lithotomy position, the perineum and vagina were  prepped and draped in the usual sterile manner.  Bimanual pelvic examination  revealed a uterus that was normal size, shape, consistency, slight  thickening of the left adnexa where the right seemed to be completely fine.  The cervix was palpable and seemed somewhat granular to the left side.  The  weighted speculum was placed in the posterior fourchette of the vagina.  The  introitus was within normal limits.  The vagina was clean, well rugated.  The cervix was parous and towards the left side, hooded by a portion of the  vaginal mucosa that came down over it which had to be held back by the  assistant so that I could put angle sutures of 1 chromic in each of the  lateral sides of the cervix.  These were figure-of-eights which were held  long for help and retraction and a single tooth and double tooth tenaculum  were used to hold up the redundant tissue anteriorly.  A 15 blade was used  to draw a circular incision around the cervical external os measuring  approximately 5 mm in depth and 1 cm outside around the entire cervix.  These were grasped with Allis clamps anterior and posterior lips and then  the conization was completed up to a depth of about 2 cm.  The lesion on the  left seemed to  extend slightly beyond the external portion of the cervix and  was somewhat regular and granulated to palpation in that area.  The cervix  was then run with a continuous running locked suture for hemostasis.  Several interrupted figure-of-eights were then placed where there were still  apparently some bleeders.  The cervix was packed with Gelfoam.  Most of the  bleeding appeared to come from the left side of the cervix where the lesion  was and there was seen to be a small trickle from there and so a figure-of-  eight was placed in that area to reinforce and cut down on the bleeding.  The uterine cavity was then sounded to a depth of 7 cm and also to make sure  that the os was still patent.  The patient was then transferred to the  recovery room in satisfactory condition with a blood loss of approximately  100 mL.                                               Phil D. Okey Dupre, M.D.    PDR/MEDQ  D:  03/25/2003  T:  03/25/2003  Job:  [id number]
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
## 2010-07-17 NOTE — Discharge Summary (Signed)
[name], MONTS                ACCOUNT NO.:  [id number]   MEDICAL RECORD NO.:  [id number]          PATIENT TYPE:  INP   LOCATION:  3710                         FACILITY:  [facility]   PHYSICIAN:  Charlton Haws, M.D.     DATE OF BIRTH:  21-Sep-1950   DATE OF ADMISSION:  08/14/2004  DATE OF DISCHARGE:  08/15/2004                                 DISCHARGE SUMMARY   PROCEDURES:  None.   REASON FOR ADMISSION:  [name] is a 60 year old female with no prior  cardiac history who presented to the emergency room with new onset  paroxysmal atrial fibrillation with right ventricular response.  She is  admitted for further management and evaluation by Dr. Charlton Haws.   Please refer to the dictated admission note for full details.   LABORATORY DATA:  CBC normal.  INR was 0.9 on admission.  Potassium 4.7, BUN  11 and creatinine 1.0.  Cardiac markers; total CPK/MB isoenzymes normal,  peak troponin I 0.10.  TSH 1.63. Admission chest x-ray; no acute  disease.   HOSPITAL COURSE:  The patient was admitted for further evaluation and  management of new paroxysmal atrial fibrillation with right ventricular  response.  She was placed on intravenous Cardizem for rate control and was  started on Coumadin with IV heparin overlap.  The patient subsequently  converted to normal sinus rhythm.  She was thus cleared for discharged the  following morning by Dr. Sherryl Manges with the recommendation to continue  medical therapy with aspirin and Inderal 10 mg p.r.n.  Dr. Graciela Husbands recommended  further outpatient evaluation with a 2-D echocardiogram and exercise stress  test.   DISCHARGE MEDICATIONS:  Medications at discharge:  1.  Coated aspirin 162 milligrams daily.  2.  Inderal 10 milligrams p.r.n.   DISCHARGE INSTRUCTIONS:   FOLLOW UP:  1.  The patient is to have  2-D echocardiogram and exercise stress Myoview -      arrangements to be made through our office.  2.  Follow up with Dr. Sherryl Manges in three to  five weeks; the patient will      be contacted by our office.   DISCHARGE DIAGNOSES:  1.  Paroxysmal atrial fibrillation with right ventricular response (new      onset); __________  spontaneous      conversion to normal sinus rhythm.  2.  Remote tobacco.  3.  History of hyperlipidemia.      Gene   GS/MEDQ  D:  08/15/2004  T:  08/16/2004  Job:  [id number]

## 2010-07-18 ENCOUNTER — Encounter: Payer: Self-pay | Admitting: Internal Medicine

## 2010-07-18 ENCOUNTER — Encounter: Payer: Self-pay | Admitting: *Deleted

## 2010-07-20 ENCOUNTER — Ambulatory Visit (INDEPENDENT_AMBULATORY_CARE_PROVIDER_SITE_OTHER): Payer: 59 | Admitting: Internal Medicine

## 2010-07-20 ENCOUNTER — Encounter: Payer: Self-pay | Admitting: Internal Medicine

## 2010-07-20 ENCOUNTER — Ambulatory Visit
Admission: RE | Admit: 2010-07-20 | Discharge: 2010-07-20 | Disposition: A | Discharge status: Home / Self Care | Payer: 59 | Source: Ambulatory Visit | Attending: Radiation Oncology | Admitting: Radiation Oncology

## 2010-07-20 VITALS — BP 114/86 | HR 86 | Temp 98.4°F | Ht 61.5 in | Wt 235.1 lb

## 2010-07-20 DIAGNOSIS — W57XXXA Bitten or stung by nonvenomous insect and other nonvenomous arthropods, initial encounter: Secondary | ICD-10-CM

## 2010-07-20 DIAGNOSIS — IMO0001 Reserved for inherently not codable concepts without codable children: Secondary | ICD-10-CM

## 2010-07-20 MED ORDER — DOXYCYCLINE HYCLATE 100 MG PO TABS: 100.0000 mg | ORAL_TABLET | Freq: Two times a day (BID) | ORAL | Status: AC

## 2010-07-20 NOTE — Patient Instructions (Signed)
It was good to see you today. Will use doxycycline antibiotics to treat for tick bite infection - Your prescription(s) have been submitted to your pharmacy. Please take as directed and contact our office if you believe you are having problem(s) with the medication(s).

## 2010-07-20 NOTE — Progress Notes (Signed)
  Subjective:    Patient ID: [name], female    DOB: Nov 14, 1950, 60 y.o.   MRN: [id number]  HPI  Here with tick bite [place]outdoor dogs Found tick attached to her back, left of center - "rough spot" present 4-5days before having removed by her sister Removed 4 days ago Mild tenderness at site of bite - but no rash or drainage, no fever or arthralgia - severe HA 3 days ago, now resolved (related to sinus)  Past Medical History  Diagnosis Date  . Persistent atrial fibrillation   . Diastolic dysfunction   . MIGRAINE HEADACHE   . Atrial fibrillation     Pradaxa anticoag  . Anemia   . HTN (hypertension)   . GLAUCOMA   . Depression   . CARPAL TUNNEL SYNDROME, RIGHT   . Cervical cancer dx 2005     Review of Systems  Constitutional: Negative for fever.  Eyes: Negative for visual disturbance.  Genitourinary: Negative for dysuria.  Neurological: Negative for numbness.  Hematological: Negative for adenopathy. Does not bruise/bleed easily.       Objective:   Physical Exam BP 114/86  Pulse 86  Temp(Src) 98.4 F (36.9 C) (Oral)  Ht 5' 1.5" (1.562 m)  Wt 235 lb 1.9 oz (106.65 kg)  BMI 43.71 kg/m2  SpO2 96% Physical Exam  Constitutional: She is overweight; oriented to person, place, and time. She appears well-developed and well-nourished. No distress. Cardiovascular: Normal rate, regular rhythm and normal heart sounds.  No murmur heard. No BLE edema. Pulmonary/Chest: Effort normal and breath sounds normal. No respiratory distress. She has no wheezes.  Skin: Skin is warm and dry. No rash noted. No erythema. localized scab with minimal surrounding cellulitis (<53mm ring) from prior tick attachment mid back over lower thoracic region -no fluctuance or drainage Psychiatric: She has a normal mood and affect. Her behavior is normal. Judgment and thought content normal.   Lab Results  Component Value Date   WBC 3.9* 05/15/2010   HGB 11.9*  05/15/2010   HCT 35.0* 05/15/2010   PLT 183 05/15/2010   ALT 28 05/16/2010   AST 25 05/16/2010   NA 139 05/22/2010   K 4.3 05/22/2010   CL 106 05/22/2010   CREATININE 1.30* 05/22/2010   BUN 18 05/22/2010   CO2 27 05/22/2010   TSH 1.700 05/16/2010   INR 1.10 05/15/2010   HGBA1C 5.2 09/19/2008        Assessment & Plan:  Tick exposure with mild cellulitis - no evidence for systemic illness but will tx with empiric doxy - erx done

## 2010-07-22 ENCOUNTER — Encounter: Payer: Self-pay | Admitting: Internal Medicine

## 2010-07-22 ENCOUNTER — Ambulatory Visit (INDEPENDENT_AMBULATORY_CARE_PROVIDER_SITE_OTHER): Payer: 59 | Admitting: Internal Medicine

## 2010-07-22 DIAGNOSIS — I5033 Acute on chronic diastolic (congestive) heart failure: Secondary | ICD-10-CM

## 2010-07-22 DIAGNOSIS — I10 Essential (primary) hypertension: Secondary | ICD-10-CM

## 2010-07-22 DIAGNOSIS — I4891 Unspecified atrial fibrillation: Secondary | ICD-10-CM

## 2010-07-22 MED ORDER — METOPROLOL TARTRATE 50 MG PO TABS: 50.0000 mg | ORAL_TABLET | Freq: Two times a day (BID) | ORAL | Status: DC

## 2010-07-22 MED ORDER — AMIODARONE HCL 200 MG PO TABS: ORAL_TABLET | ORAL | Status: DC

## 2010-07-22 NOTE — Patient Instructions (Signed)
Your physician recommends that you schedule a follow-up appointment in: 4-5 weeks  Your physician has recommended you make the following change in your medication: start Amiodarone 200mg   2 tablets twice daily and decrease Lopressor 50mg  twice daily

## 2010-07-23 NOTE — Assessment & Plan Note (Signed)
Ms Brake is doing very poor in atrial fibrillation.  Her rhythm has proven quite difficult to control.  She recently failed medical therapy with tikosyn and poorly tolerates rate control.  She is appropriately anticoagulated with pradaxa.   Therapeutic strategies for afib including rate and rhythm control were discussed in detail with the patient today. Risk, benefits, and alternatives to amiodarone were discussed at length today.  At this time, I think that we should once more try rhythm control.  IF we are able to maintain sinus rhythm with amiodarone and her health remains stable from the standpoint of her cervical cancer, we could entertain the idea of ablation down the road.  If we are unable to achieve/ maintain sinus rhythm with amiodarone or if her health otherwise declines, the rate control would likely be our only option at that point.  She will return in 4 weeks.  We will consider [facility] if she is still in afib at that point.  The importance of compliance with pradaxa was stressed today.  I have decreased metoprolol to 50mg  BID today due to fatigue.

## 2010-07-23 NOTE — Progress Notes (Signed)
The patient presents today for routine electrophysiology followup.  She continues to have an ongoing workup and treatment discussion for recurrent cervical cancer after her recent PET scan showed recurrence.  Today, she denies symptoms of palpitations, chest pain, shortness of breath, orthopnea, PND, lower extremity edema, dizziness, presyncope, syncope, or neurologic sequela.  She feels quite fatigued with her afib.  She reports significant decrease in exercise tolerance and states that she feels "worn out" most of the time.  She feels that this is likely related to medication for heart rate control.     Past Medical History  Diagnosis Date  . Persistent atrial fibrillation   . Diastolic dysfunction   . MIGRAINE HEADACHE   . Atrial fibrillation     Pradaxa anticoag  . Anemia   . HTN (hypertension)   . GLAUCOMA   . Depression   . CARPAL TUNNEL SYNDROME, RIGHT   . Cervical cancer dx 2005   Past Surgical History  Procedure Date  . Total abdominal hysterectomy   . Cholecystectomy   . Ureteral stent placement 2005, 2010  . Oophorectomy   . Urethal stent (2005 & 2010)   . Cardioversion   . Tonsillectomy     Current outpatient prescriptions:diltiazem (CARDIZEM CD) 120 MG 24 hr capsule, daily. , Disp: , Rfl: ;  doxycycline (VIBRA-TABS) 100 MG tablet, Take 1 tablet (100 mg total) by mouth 2 (two) times daily., Disp: 20 tablet, Rfl: 0;  furosemide (LASIX) 40 MG tablet, daily. , Disp: , Rfl: ;  metoprolol (LOPRESSOR) 50 MG tablet, Take 1 tablet (50 mg total) by mouth 2 (two) times daily., Disp: 60 tablet, Rfl: 11 Multiple Vitamin (MULTIVITAMIN) tablet, Take 1 tablet by mouth daily.  , Disp: , Rfl: ;  omeprazole (PRILOSEC) 20 MG capsule, Take 20 mg by mouth daily.  , Disp: , Rfl: ;  potassium chloride SA (K-DUR,KLOR-CON) 20 MEQ tablet, daily. , Disp: , Rfl: ;  PRADAXA 150 MG CAPS, TAKE ONE CAPSULE BY MOUTH TWICE A DAY, Disp: 60 capsule, Rfl: 3;  amiodarone (PACERONE) 200 MG tablet, Take 2 tablets  twice daily, Disp: 120 tablet, Rfl: 11  Allergies  Allergen Reactions  . Codeine     REACTION: Breathing issues and rash 40 years ago  . Morphine     REACTION: Hallucinations    History   Social History  . Marital Status: Single    Spouse Name: N/A    Number of Children: N/A  . Years of Education: N/A   Occupational History  . Not on file.   Social History Main Topics  . Smoking status: Former Smoker    Quit date: 03/01/1984  . Smokeless tobacco: Not on file  . Alcohol Use: No  . Drug Use: No  . Sexually Active: Not on file   Other Topics Concern  . 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 Lung cancer    . Hypertension    . Heart disease    . Stroke    . Asthma    . Asthma Son     Physical Exam: Filed Vitals:   07/22/10 1517  BP: 136/90  Height: 5\' 1"  (1.549 m)  Weight: 235 lb (106.595 kg)    GEN- The patient is well appearing, alert and oriented x 3 today.   Head- normocephalic, atraumatic Eyes-  Sclera clear, conjunctiva pink Ears- hearing intact Oropharynx- clear Neck- supple, no JVP Lymph- no cervical lymphadenopathy Lungs- Clear to ausculation bilaterally, normal work of breathing Heart- irregular rate and rhythm tachycardic , no murmurs, rubs or gallops, PMI not laterally displaced GI- soft, NT, ND, + BS Extremities- no clubbing, cyanosis, or edema MS- no significant deformity or atrophy Skin- no rash or lesion Psych- euthymic mood, full affect Neuro- strength and sensation are intact

## 2010-07-23 NOTE — Assessment & Plan Note (Signed)
Stable No change required today  

## 2010-07-23 NOTE — Assessment & Plan Note (Signed)
Improved

## 2010-08-11 ENCOUNTER — Ambulatory Visit (HOSPITAL_BASED_OUTPATIENT_CLINIC_OR_DEPARTMENT_OTHER)
Admission: RE | Admit: 2010-08-11 | Discharge: 2010-08-11 | Disposition: A | Discharge status: Home / Self Care | Payer: 59 | Source: Ambulatory Visit | Attending: Urology | Admitting: Urology

## 2010-08-11 DIAGNOSIS — Z79899 Other long term (current) drug therapy: Secondary | ICD-10-CM | POA: Insufficient documentation

## 2010-08-11 DIAGNOSIS — I519 Heart disease, unspecified: Secondary | ICD-10-CM | POA: Insufficient documentation

## 2010-08-11 DIAGNOSIS — K219 Gastro-esophageal reflux disease without esophagitis: Secondary | ICD-10-CM | POA: Insufficient documentation

## 2010-08-11 DIAGNOSIS — I10 Essential (primary) hypertension: Secondary | ICD-10-CM | POA: Insufficient documentation

## 2010-08-11 DIAGNOSIS — C539 Malignant neoplasm of cervix uteri, unspecified: Secondary | ICD-10-CM | POA: Insufficient documentation

## 2010-08-11 DIAGNOSIS — N1330 Unspecified hydronephrosis: Secondary | ICD-10-CM | POA: Insufficient documentation

## 2010-08-11 LAB — CBC
MCH: 30.4 pg (ref 26.0–34.0)
MCV: 87.9 fL (ref 78.0–100.0)
Platelets: 178 10*3/uL (ref 150–400)
RBC: 3.81 MIL/uL — ABNORMAL LOW (ref 3.87–5.11)
RDW: 13.3 % (ref 11.5–15.5)
WBC: 3.2 10*3/uL — ABNORMAL LOW (ref 4.0–10.5)

## 2010-08-11 LAB — BASIC METABOLIC PANEL
CO2: 27 mEq/L (ref 19–32)
Calcium: 9.5 mg/dL (ref 8.4–10.5)
Creatinine, Ser: 1.27 mg/dL — ABNORMAL HIGH (ref 0.4–1.2)
GFR calc Af Amer: 52 mL/min — ABNORMAL LOW (ref >60)
Sodium: 139 mEq/L (ref 135–145)

## 2010-08-13 NOTE — Op Note (Signed)
  [name], [name]                ACCOUNT NO.:  [id number]  MEDICAL RECORD NO.:  [id number]  LOCATION:                                 FACILITY:  PHYSICIAN:  Courtney Paris, M.D.DATE OF BIRTH:  05/02/1950  DATE OF PROCEDURE:  08/11/2010 DATE OF DISCHARGE:                              OPERATIVE REPORT   PREOPERATIVE DIAGNOSIS:  Left hydronephrosis.  POSTOPERATIVE DIAGNOSIS:  Left hydronephrosis.  OPERATION:  Cystoscopy, exchange left ureteral stent with fluoroscopy.  ANESTHESIA:  General.  SURGEON:  Courtney Paris, M.D.  BRIEF HISTORY:  This 60 year old white female has recurrent cervical cancer and left hydronephrosis managed with a stent since 2005.  She finished external radiation of her pelvic sidewall in May of 2011 but still has some positive lymph nodes on both the right and left pelvis on a PET scan done 8 weeks ago.  She has an obstructed, poorly functioning left kidney, requiring stent change, last done December 2011.  She wants to have this changed at this time.  DESCRIPTION OF PROCEDURE:  The patient was placed on the operating table in dorsal lithotomy position.  After being prepped and draped with Betadine in usual sterile fashion, time-out was then performed.  The patient and procedure then reconfirmed.  One panendoscope was inserted and the bladder inspected.  There were a lot of polyps around the left ureteral orifice and the stent was seen coming from the left orifice and was encrusted with stone.  At the end, the stent was grasped with forceps and pulled outside the urethral meatus where it was cut off with scissors.  A sensor guidewire was then passed up the stent to the kidney and the stent was then removed without difficulty.  Over the stent a new 6-French x 24 cm length double-J ureteral stent was then passed and the guidewire was removed with a nice coil in the renal pelvis and one in the bladder.  The stent was adjusted slightly  with forceps, the bladder drained, scope removed.  Xylocaine jelly instilled into the urethra and the patient was given Toradol.  She was discharged home with some antibiotics for a few days and will have the stent changed again in 6 months.     Courtney Paris, M.D.     HMK/MEDQ  D:  08/11/2010  T:  08/11/2010  Job:  [id number]  Electronically Signed by Vic Blackbird M.D. on 08/13/2010 10:15:39 AM

## 2010-08-19 ENCOUNTER — Ambulatory Visit (INDEPENDENT_AMBULATORY_CARE_PROVIDER_SITE_OTHER)
Admission: RE | Admit: 2010-08-19 | Discharge: 2010-08-19 | Disposition: A | Discharge status: Home / Self Care | Payer: 59 | Source: Ambulatory Visit | Attending: Internal Medicine | Admitting: Internal Medicine

## 2010-08-19 ENCOUNTER — Encounter: Payer: Self-pay | Admitting: Internal Medicine

## 2010-08-19 ENCOUNTER — Ambulatory Visit (INDEPENDENT_AMBULATORY_CARE_PROVIDER_SITE_OTHER): Payer: 59 | Admitting: Internal Medicine

## 2010-08-19 VITALS — BP 118/82 | HR 72 | Temp 98.8°F | Ht 61.5 in

## 2010-08-19 DIAGNOSIS — M25469 Effusion, unspecified knee: Secondary | ICD-10-CM

## 2010-08-19 DIAGNOSIS — M25462 Effusion, left knee: Secondary | ICD-10-CM

## 2010-08-19 NOTE — Patient Instructions (Addendum)
It was good to see you today. We have reviewed your recent records including labs and tests today Xray test(s) ordered today. Your results will be called to you after review (48-72hours after test completion). If any changes need to be made, you will be notified at that time. We'll plan referral to orthopedics for your knee depending on the xray. Our office will contact you regarding appointment(s) once made. Please schedule followup in 4-6 months for routine check, call sooner if problems.
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
## 2010-08-21 ENCOUNTER — Ambulatory Visit: Payer: 59 | Attending: Gynecology | Admitting: Gynecology

## 2010-08-21 ENCOUNTER — Other Ambulatory Visit: Payer: Self-pay | Admitting: Oncology

## 2010-08-21 DIAGNOSIS — I890 Lymphedema, not elsewhere classified: Secondary | ICD-10-CM | POA: Insufficient documentation

## 2010-08-21 DIAGNOSIS — M129 Arthropathy, unspecified: Secondary | ICD-10-CM | POA: Insufficient documentation

## 2010-08-21 DIAGNOSIS — Z9079 Acquired absence of other genital organ(s): Secondary | ICD-10-CM | POA: Insufficient documentation

## 2010-08-21 DIAGNOSIS — Z7901 Long term (current) use of anticoagulants: Secondary | ICD-10-CM | POA: Insufficient documentation

## 2010-08-21 DIAGNOSIS — C539 Malignant neoplasm of cervix uteri, unspecified: Secondary | ICD-10-CM

## 2010-08-21 DIAGNOSIS — Z79899 Other long term (current) drug therapy: Secondary | ICD-10-CM | POA: Insufficient documentation

## 2010-08-21 DIAGNOSIS — I4891 Unspecified atrial fibrillation: Secondary | ICD-10-CM | POA: Insufficient documentation

## 2010-08-21 DIAGNOSIS — I10 Essential (primary) hypertension: Secondary | ICD-10-CM | POA: Insufficient documentation

## 2010-08-21 DIAGNOSIS — E785 Hyperlipidemia, unspecified: Secondary | ICD-10-CM | POA: Insufficient documentation

## 2010-08-21 DIAGNOSIS — Z90710 Acquired absence of both cervix and uterus: Secondary | ICD-10-CM | POA: Insufficient documentation

## 2010-08-21 DIAGNOSIS — E669 Obesity, unspecified: Secondary | ICD-10-CM | POA: Insufficient documentation

## 2010-08-21 DIAGNOSIS — K219 Gastro-esophageal reflux disease without esophagitis: Secondary | ICD-10-CM | POA: Insufficient documentation

## 2010-08-24 NOTE — Consult Note (Signed)
[name], [name]                ACCOUNT NO.:  [id number]  MEDICAL RECORD NO.:  [id number]  LOCATION:  GYN                          FACILITY:  [facility]  PHYSICIAN:  De Blanch, M.D.DATE OF BIRTH:  12/19/1950  DATE OF CONSULTATION:  08/21/2010 DATE OF DISCHARGE:                                CONSULTATION   CHIEF COMPLAINT:  Recurrent cervical cancer, edema of the left lower extremity.  INTERVAL HISTORY:  The patient returns today for continuing followup. Her most recent treatment was radiation therapy to right inguinal lymph node and left external iliac lymph node delivered by Dr. Roselind Messier. Treatment was completed in January of 2012.  She received concurrent cisplatin administered by Dr. Darrold Span.  The total dose of the right inguinal node was 2504 cGy in 8 fractions and 1504 cGy in 8 fractions to the left external iliac node (the patient had previously received whole pelvis radiation therapy for part of her primary therapy). Subsequently, the patient had a PET scan on June 25, 2010, which showed resolution of uptake in the right inguinal lymph node and uptake in the left external iliac lymph node.  Symptomatically, the patient is not having any abdominal or pelvic pain. She recently had her ureteral stent replaced by Dr. Aldean Ast.  She has noted new onset of severe edema of the left lower extremity.  She is also having some difficulty with arthritis in her left knee.  She continues to work full time.  She has no other GI or GU symptoms.  HISTORY OF PRESENT ILLNESS:  The patient underwent a radical hysterectomy, bilateral salpingo-oophorectomy and pelvic lymphadenectomy on March 2005 for a stage IB cervical cancer.  Final pathology showed no residual disease and all lymph nodes were negative.  The patient was then followed for 5 years free of disease until she developed recurrence of the right pelvic sidewall in February 2010.  This resulted in obstructed ureter.   She was treated with radiation therapy and concurrent cisplatin.  Subsequently in the latter part of 2011, the patient was found to have recurrence in the right inguinal node and left pelvic lymph node and received additional radiation therapy to those areas.  PAST MEDICAL HISTORY:  Medical illnesses; atrial fibrillation, depression, glaucoma, GERD, hyperlipidemia, hypertension, arthritis and obesity.  PAST SURGICAL HISTORY:  Cold knife conization, radical hysterectomy with pelvic lymphadenectomy, carpal tunnel release, ureteral stent placements, tonsils and adenoidectomy and colonoscopy.  DRUG ALLERGIES:  MORPHINE and CODEINE  CURRENT MEDICATIONS:  Atenolol, lisinopril, Coumadin, Prilosec, aspirin and multivitamins.  SOCIAL HISTORY:  The patient is married.  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  She does not smoke.  REVIEW OF SYSTEMS:  Ten-point comprehensive review of systems negative except as noted above.  PHYSICAL EXAMINATION:  VITAL SIGNS:  Weight 238 pounds, blood pressure 128/76, pulse 72, respiratory rate 18. GENERAL:  The patient is a moderately obese and healthy but tearful white female, in no acute distress. HEENT:  Negative. NECK:  Supple without thyromegaly.  There is no supraclavicular adenopathy.  She does have a palpable 2 cm right inguinal lymph node. There is no left inguinal adenopathy. ABDOMEN:  The abdomen is obese, soft, nontender.  No mass, organomegaly, ascites or hernias noted. PELVIC EXAM:  EG BUS, vagina, bladder, urethra are normal.  Cervix and uterus are surgically absent.  There are no lesions.  Bimanual rectovaginal exam revealed no masses, induration or nodularity. EXTREMITIES:  The patient has severe lymphedema of the left lower extremity.  There is no evidence of cellulitis.  IMPRESSION: 1. Recurrent cervical cancer.  The patient is clinically free of     disease although PET scan is suspicious with regard to lymph node     on  the left pelvic sidewall.  I would recommend that we repeat a     PET scan approximately 3 months following the prior scan (scheduled     for 10/05/2010) to reassess her disease status.  Certainly this one     lymph node is not causing any symptoms at the present time. 2. She has severe lymphedema.  We will refer her to Physical Therapy     for manual lymphatic massage therapy. Her appointment is 08/26/2010.     We will schedule the PET scan and I will plan on seeing the patient      back shortly after this scan is completed.     De Blanch, M.D.     DC/MEDQ  D:  08/21/2010  T:  08/21/2010  Job:  [id number]  cc:   Telford Nab, R.N. [address] [place], [place] [address]  Billie Lade, Ph.D., M.D. Fax: [phone]  Reece Packer, M.D. Fax: [phone]  Courtney Paris, M.D. Fax: [phone]  Electronically Signed by De Blanch M.D. on 08/24/2010 12:53:13 PM

## 2010-08-26 ENCOUNTER — Encounter: Payer: Self-pay | Admitting: Internal Medicine

## 2010-08-26 ENCOUNTER — Ambulatory Visit (INDEPENDENT_AMBULATORY_CARE_PROVIDER_SITE_OTHER): Payer: 59 | Admitting: Internal Medicine

## 2010-08-26 ENCOUNTER — Ambulatory Visit: Payer: 59 | Attending: Gynecology | Admitting: Physical Therapy

## 2010-08-26 VITALS — BP 110/72 | HR 66 | Resp 14 | Ht 62.0 in | Wt 239.0 lb

## 2010-08-26 DIAGNOSIS — IMO0001 Reserved for inherently not codable concepts without codable children: Secondary | ICD-10-CM | POA: Insufficient documentation

## 2010-08-26 DIAGNOSIS — I4891 Unspecified atrial fibrillation: Secondary | ICD-10-CM

## 2010-08-26 DIAGNOSIS — I890 Lymphedema, not elsewhere classified: Secondary | ICD-10-CM | POA: Insufficient documentation

## 2010-08-26 MED ORDER — AMIODARONE HCL 200 MG PO TABS: ORAL_TABLET | ORAL | Status: DC

## 2010-08-26 NOTE — Progress Notes (Signed)
The patient presents today for routine electrophysiology followup.  She continues to have an ongoing workup and treatment discussion for recurrent cervical cancer after her recent PET scan showed recurrence.  Today, she denies symptoms of palpitations, chest pain, shortness of breath, orthopnea, PND, lower extremity edema, dizziness, presyncope, syncope, or neurologic sequela.  She feels quite fatigued with her afib.  She reports significant decrease in exercise tolerance and states that she feels "worn out" most of the time.   Her heart rates are better controlled on amiodarone.  She has stable L leg edema which she attributes to lymphedema related to her cervical Ca.  Past Medical History  Diagnosis Date  . Diastolic dysfunction   . MIGRAINE HEADACHE   . Atrial fibrillation     Pradaxa anticoag  . Anemia   . HTN (hypertension)   . GLAUCOMA   . Depression   . CARPAL TUNNEL SYNDROME, RIGHT   . Cervical cancer dx 2005  . Lymphedema     R leg   Past Surgical History  Procedure Date  . Total abdominal hysterectomy   . Cholecystectomy   . Ureteral stent placement 2005, 2010  . Oophorectomy   . Urethal stent (2005 & 2010)   . Cardioversion   . Tonsillectomy     Current outpatient prescriptions:amiodarone (PACERONE) 200 MG tablet, Take 2 tablets twice daily, Disp: 120 tablet, Rfl: 11;  diltiazem (CARDIZEM CD) 120 MG 24 hr capsule, Take 120 mg by mouth daily. , Disp: , Rfl: ;  furosemide (LASIX) 40 MG tablet, Take 40 mg by mouth daily. , Disp: , Rfl: ;  metoprolol (LOPRESSOR) 50 MG tablet, Take 1 tablet (50 mg total) by mouth 2 (two) times daily., Disp: 60 tablet, Rfl: 11 Multiple Vitamin (MULTIVITAMIN) tablet, Take 1 tablet by mouth daily.  , Disp: , Rfl: ;  omeprazole (PRILOSEC) 20 MG capsule, Take 20 mg by mouth daily.  , Disp: , Rfl: ;  potassium chloride SA (K-DUR,KLOR-CON) 20 MEQ tablet, Take 20 mEq by mouth daily. , Disp: , Rfl: ;  PRADAXA 150 MG CAPS, TAKE ONE CAPSULE BY MOUTH TWICE A  DAY, Disp: 60 capsule, Rfl: 3  Allergies  Allergen Reactions  . Codeine     REACTION: Breathing issues and rash 40 years ago  . Morphine     REACTION: Hallucinations    History   Social History  . Marital Status: Single    Spouse Name: N/A    Number of Children: N/A  . Years of Education: N/A   Occupational History  . Not on file.   Social History Main Topics  . Smoking status: Former Smoker    Quit date: 03/01/1984  . Smokeless tobacco: Not on file  . Alcohol Use: No  . Drug Use: No  . Sexually Active: Not on file   Other Topics Concern  . 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 Lung cancer    . Hypertension    . Heart disease    . Stroke    . Asthma    . Asthma Son     Physical Exam: Filed Vitals:   08/26/10 0909  BP: 110/72  Pulse: 66  Resp: 14  Height: 5\' 2"  (1.575 m)  Weight: 239 lb (108.41 kg)    GEN- The patient is well appearing, alert  and oriented x 3 today.   Head- normocephalic, atraumatic Eyes-  Sclera clear, conjunctiva pink Ears- hearing intact Oropharynx- clear Neck- supple, no JVP Lymph- no cervical lymphadenopathy Lungs- Clear to ausculation bilaterally, normal work of breathing Heart- irregular rate and rhythm tachycardic , no murmurs, rubs or gallops, PMI not laterally displaced GI- soft, NT, ND, + BS Extremities- no clubbing, cyanosis, 1+ left leg edema MS- no significant deformity or atrophy Skin- no rash or lesion Psych- euthymic mood, full affect Neuro- strength and sensation are intact   ekg today reveals afib, V rate 66

## 2010-08-26 NOTE — Patient Instructions (Addendum)
Your physician recommends that you schedule a follow-up appointment in: 8 weeks  Your physician has recommended you make the following change in your medication: decrease your Amiodarone to 200mg  daily  Will need to schedule a DCCV in 3 weeks around 09/16/10  Do not miss any Pradaxa

## 2010-08-26 NOTE — Assessment & Plan Note (Signed)
[name] is doing very poor in atrial fibrillation.  Her rhythm has proven quite difficult to control.  She recently failed medical therapy with tikosyn and poorly tolerates rate control.  She is appropriately anticoagulated with pradaxa.   Therapeutic strategies for afib including rate and rhythm control were discussed in detail with the patient today. Risk, benefits, and alternatives to amiodarone were discussed at length today.  At this time, I think that we should once more try rhythm control.  I have started amiodarone and will decrease this today to 200mg  daily.  We will schedule cardioversion in 3 weeks.  The importance with uninterupted twice daily pradaxa was again stressed with her today.

## 2010-09-07 ENCOUNTER — Encounter: Payer: 59 | Admitting: Physical Therapy

## 2010-09-08 ENCOUNTER — Telehealth: Payer: Self-pay | Admitting: Internal Medicine

## 2010-09-08 ENCOUNTER — Encounter: Payer: Self-pay | Admitting: *Deleted

## 2010-09-08 NOTE — Telephone Encounter (Signed)
Status of cardioversion. Pt thought she suppose to be going in on 7/18.

## 2010-09-08 NOTE — Telephone Encounter (Signed)
Patient to have DCCV on Fri 09/17/10 with labs on 09/11/10 at 4:30

## 2010-09-09 ENCOUNTER — Encounter: Payer: 59 | Admitting: Physical Therapy

## 2010-09-11 ENCOUNTER — Other Ambulatory Visit (INDEPENDENT_AMBULATORY_CARE_PROVIDER_SITE_OTHER): Payer: 59 | Admitting: *Deleted

## 2010-09-11 ENCOUNTER — Encounter: Payer: 59 | Admitting: Physical Therapy

## 2010-09-11 DIAGNOSIS — G43909 Migraine, unspecified, not intractable, without status migrainosus: Secondary | ICD-10-CM

## 2010-09-11 DIAGNOSIS — H409 Unspecified glaucoma: Secondary | ICD-10-CM

## 2010-09-11 DIAGNOSIS — G5600 Carpal tunnel syndrome, unspecified upper limb: Secondary | ICD-10-CM

## 2010-09-11 DIAGNOSIS — C539 Malignant neoplasm of cervix uteri, unspecified: Secondary | ICD-10-CM

## 2010-09-11 DIAGNOSIS — I10 Essential (primary) hypertension: Secondary | ICD-10-CM

## 2010-09-11 DIAGNOSIS — I951 Orthostatic hypotension: Secondary | ICD-10-CM

## 2010-09-11 DIAGNOSIS — D649 Anemia, unspecified: Secondary | ICD-10-CM

## 2010-09-11 DIAGNOSIS — I4891 Unspecified atrial fibrillation: Secondary | ICD-10-CM

## 2010-09-11 DIAGNOSIS — I5033 Acute on chronic diastolic (congestive) heart failure: Secondary | ICD-10-CM

## 2010-09-11 DIAGNOSIS — F329 Major depressive disorder, single episode, unspecified: Secondary | ICD-10-CM

## 2010-09-11 LAB — CBC
HCT: 35.2 % — ABNORMAL LOW (ref 36.0–46.0)
MCV: 90.7 fL (ref 78.0–100.0)
RBC: 3.88 MIL/uL (ref 3.87–5.11)
WBC: 3.9 10*3/uL — ABNORMAL LOW (ref 4.0–10.5)

## 2010-09-12 LAB — BASIC METABOLIC PANEL WITH GFR
CO2: 23 mEq/L (ref 19–32)
Chloride: 104 mEq/L (ref 96–112)
Potassium: 3.7 mEq/L (ref 3.5–5.3)
Sodium: 140 mEq/L (ref 135–145)

## 2010-09-14 ENCOUNTER — Encounter: Payer: 59 | Admitting: Physical Therapy

## 2010-09-15 ENCOUNTER — Other Ambulatory Visit: Payer: 59

## 2010-09-15 ENCOUNTER — Encounter: Payer: Self-pay | Admitting: Internal Medicine

## 2010-09-15 ENCOUNTER — Ambulatory Visit (INDEPENDENT_AMBULATORY_CARE_PROVIDER_SITE_OTHER): Payer: 59 | Admitting: Internal Medicine

## 2010-09-15 VITALS — BP 112/72 | HR 78 | Temp 98.8°F | Ht 61.5 in

## 2010-09-15 DIAGNOSIS — R319 Hematuria, unspecified: Secondary | ICD-10-CM

## 2010-09-15 LAB — POCT URINALYSIS DIPSTICK
Bilirubin, UA: NEGATIVE
Glucose, UA: NEGATIVE
Nitrite, UA: NEGATIVE
Spec Grav, UA: 1.002

## 2010-09-15 MED ORDER — AMOXICILLIN 500 MG PO CAPS: 500.0000 mg | ORAL_CAPSULE | Freq: Three times a day (TID) | ORAL | Status: AC

## 2010-09-15 MED ORDER — TERCONAZOLE 0.8 % VA CREA: 1.0000 | TOPICAL_CREAM | Freq: Every day | VAGINAL | Status: AC

## 2010-09-15 MED ORDER — CIPROFLOXACIN HCL 500 MG PO TABS: 500.0000 mg | ORAL_TABLET | Freq: Two times a day (BID) | ORAL | Status: DC

## 2010-09-15 NOTE — Patient Instructions (Addendum)
It was good to see you today. Will treat with amoxiclline antibiotics for probable bladder infection - also terazole cream for yeast - Your prescription(s) have been submitted to your pharmacy. Please take as directed and contact our office if you believe you are having problem(s) with the medication(s). Urine culture ordered today. Your results will be called to you after review (48-72hours after test completion). If any changes need to be made, you will be notified at that time. Please schedule followup in 2-3 months, call sooner if problems.

## 2010-09-15 NOTE — Progress Notes (Signed)
  Subjective:    Patient ID: [name], female    DOB: 1950-05-18, 60 y.o.   MRN: [id number]  HPI  complains of gross hematuria Onset 48h ago associated with low central back discomfort, LGF and cramping No vaginal bleeding or recent cath - last cysto with L ureteral stent exchange 07/2010 On pradaxa - anticipating DCCV for A fib next week -  Past Medical History  Diagnosis Date  . Diastolic dysfunction   . MIGRAINE HEADACHE   . Atrial fibrillation     Pradaxa anticoag  . Anemia   . HTN (hypertension)   . GLAUCOMA   . Depression   . CARPAL TUNNEL SYNDROME, RIGHT   . Lymphedema     L>R leg from pelvic XRT/scarring  . Cervical cancer dx 2005, recur 04/2008    s/p chemo/XRT; recurrent dz L pelvis dx 2010  . Hepatic steatosis     CT scan 06/2009, 12/2009    Review of Systems  Constitutional: Negative for diaphoresis.  Cardiovascular: Negative for chest pain and palpitations.  Genitourinary: Positive for hematuria.  Musculoskeletal: Positive for back pain.       Objective:   Physical Exam  BP 112/72  Pulse 78  Temp(Src) 98.8 F (37.1 C) (Oral)  Ht 5' 1.5" (1.562 m)  SpO2 98% Physical Exam  Constitutional: She is obese; oriented to person, place, and time. She appears well-developed and well-nourished. No distress. Cardiovascular: Normal rate, regular rhythm and normal heart sounds.  No murmur heard. trace chronic BLE edema, L>R. Pulmonary/Chest: Effort normal and breath sounds normal. No respiratory distress. She has no wheezes.  Abd: obese, S, NTND, no flank tenderness Psychiatric: She has a normal but slightly depressed/anxious mood and affect. Her behavior is normal. Judgment and thought content normal.   Lab Results  Component Value Date   WBC 3.9* 09/11/2010   HGB 11.4* 09/11/2010   HCT 35.2* 09/11/2010   PLT 220 09/11/2010   ALT 28 05/16/2010   AST 25 05/16/2010   NA 140 09/11/2010   K 3.7 09/11/2010   CL 104 09/11/2010   CREATININE 1.45* 09/11/2010   BUN 21  09/11/2010   CO2 23 09/11/2010   TSH 1.700 05/16/2010   INR 1.10 05/15/2010   HGBA1C 5.2 09/19/2008        Assessment & Plan:   Hematuria - acute onset 48h ago - improving but not resolved; ideally would like to hold anticoag but ok to continue at this time given upcoming DCCV for A fib - broad ddx but will start empiric tx for probable UTI (given dysuria and LGF) and send Ucx - If continued problems and no UTI, consider need for CT A/P given L hydro/stent and known L pelvic cervical ca recurrence.

## 2010-09-16 ENCOUNTER — Encounter: Payer: 59 | Admitting: Physical Therapy

## 2010-09-17 ENCOUNTER — Ambulatory Visit (HOSPITAL_COMMUNITY)
Admission: RE | Admit: 2010-09-17 | Discharge: 2010-09-17 | Disposition: A | Discharge status: Home / Self Care | Payer: 59 | Source: Ambulatory Visit | Attending: Internal Medicine | Admitting: Internal Medicine

## 2010-09-17 DIAGNOSIS — I4891 Unspecified atrial fibrillation: Secondary | ICD-10-CM | POA: Insufficient documentation

## 2010-09-17 DIAGNOSIS — I10 Essential (primary) hypertension: Secondary | ICD-10-CM | POA: Insufficient documentation

## 2010-09-17 DIAGNOSIS — I519 Heart disease, unspecified: Secondary | ICD-10-CM | POA: Insufficient documentation

## 2010-09-17 LAB — URINE CULTURE: Colony Count: 50000

## 2010-09-18 ENCOUNTER — Encounter: Payer: 59 | Admitting: Physical Therapy

## 2010-09-21 ENCOUNTER — Encounter: Payer: 59 | Admitting: Physical Therapy

## 2010-09-23 ENCOUNTER — Encounter: Payer: 59 | Admitting: Physical Therapy

## 2010-09-24 NOTE — Discharge Summary (Signed)
  [name], [name]                ACCOUNT NO.:  [id number]  MEDICAL RECORD NO.:  [id number]  LOCATION:  [facility]                         FACILITY:  [facility]  PHYSICIAN:  Pricilla Riffle, MD, FACCDATE OF BIRTH:  08-14-1950  DATE OF ADMISSION:  09/17/2010 DATE OF DISCHARGE:                              DISCHARGE SUMMARY   IDENTIFICATION:  The patient is a 60 year old with history of atrial fibrillation, plan for cardioversion.  The patient was anesthetized by Anesthesia with 130 mg of propofol IV.  Pads were placed in the AP position.  First attempt at cardioversion was unsuccessful (200-joule synchronized biphasic energy).  Second attempt included pressing on the chest to decrease intrathoracic distance. Again 200-joule synchronized biphasic energy was reapplied without successful cardioversion.  The pads were switched to the AP position.  Again, 200-joule synchronized biphasic energy was applied.  This was unsuccessful cardioversion.  Procedure was without complications.  Dr. Johney Frame was notified of above. Plan was to discontinue amiodarone, continue other meds.  The patient will follow up in clinic.     Pricilla Riffle, MD, [facility]     PVR/MEDQ  D:  09/17/2010  T:  09/18/2010  Job:  [id number]  Electronically Signed by Dietrich Pates MD [facility] on 09/24/2010 11:52:52 PM

## 2010-09-25 ENCOUNTER — Other Ambulatory Visit: Payer: Self-pay

## 2010-09-25 ENCOUNTER — Encounter: Payer: 59 | Admitting: Physical Therapy

## 2010-09-25 MED ORDER — FUROSEMIDE 40 MG PO TABS: 40.0000 mg | ORAL_TABLET | Freq: Every day | ORAL | Status: DC

## 2010-09-28 ENCOUNTER — Encounter: Payer: 59 | Admitting: Physical Therapy

## 2010-09-30 ENCOUNTER — Encounter: Payer: 59 | Admitting: Physical Therapy

## 2010-10-02 ENCOUNTER — Encounter: Payer: 59 | Admitting: Physical Therapy

## 2010-10-05 ENCOUNTER — Encounter (HOSPITAL_COMMUNITY)
Admission: RE | Admit: 2010-10-05 | Discharge: 2010-10-05 | Disposition: A | Discharge status: Home / Self Care | Payer: 59 | Source: Ambulatory Visit | Attending: Gynecology | Admitting: Gynecology

## 2010-10-05 DIAGNOSIS — N269 Renal sclerosis, unspecified: Secondary | ICD-10-CM | POA: Insufficient documentation

## 2010-10-05 DIAGNOSIS — K7689 Other specified diseases of liver: Secondary | ICD-10-CM | POA: Insufficient documentation

## 2010-10-05 DIAGNOSIS — I517 Cardiomegaly: Secondary | ICD-10-CM | POA: Insufficient documentation

## 2010-10-05 DIAGNOSIS — C539 Malignant neoplasm of cervix uteri, unspecified: Secondary | ICD-10-CM | POA: Insufficient documentation

## 2010-10-05 DIAGNOSIS — J9819 Other pulmonary collapse: Secondary | ICD-10-CM | POA: Insufficient documentation

## 2010-10-05 LAB — GLUCOSE, CAPILLARY: Glucose-Capillary: 99 mg/dL (ref 70–99)

## 2010-10-05 MED ORDER — FLUDEOXYGLUCOSE F - 18 (FDG) INJECTION
16.6000 | Freq: Once | INTRAVENOUS | Status: AC | PRN
  Administered 2010-10-05: 16.6 via INTRAVENOUS

## 2010-10-06 ENCOUNTER — Ambulatory Visit: Payer: 59 | Attending: Gynecology | Admitting: Gynecology

## 2010-10-06 DIAGNOSIS — I10 Essential (primary) hypertension: Secondary | ICD-10-CM | POA: Insufficient documentation

## 2010-10-06 DIAGNOSIS — E785 Hyperlipidemia, unspecified: Secondary | ICD-10-CM | POA: Insufficient documentation

## 2010-10-06 DIAGNOSIS — E669 Obesity, unspecified: Secondary | ICD-10-CM | POA: Insufficient documentation

## 2010-10-06 DIAGNOSIS — C787 Secondary malignant neoplasm of liver and intrahepatic bile duct: Secondary | ICD-10-CM | POA: Insufficient documentation

## 2010-10-06 DIAGNOSIS — Z79899 Other long term (current) drug therapy: Secondary | ICD-10-CM | POA: Insufficient documentation

## 2010-10-06 DIAGNOSIS — C539 Malignant neoplasm of cervix uteri, unspecified: Secondary | ICD-10-CM | POA: Insufficient documentation

## 2010-10-06 DIAGNOSIS — K219 Gastro-esophageal reflux disease without esophagitis: Secondary | ICD-10-CM | POA: Insufficient documentation

## 2010-10-06 DIAGNOSIS — R319 Hematuria, unspecified: Secondary | ICD-10-CM | POA: Insufficient documentation

## 2010-10-06 DIAGNOSIS — I4891 Unspecified atrial fibrillation: Secondary | ICD-10-CM | POA: Insufficient documentation

## 2010-10-08 NOTE — Consult Note (Signed)
[name], [name]                ACCOUNT NO.:  [id number]  MEDICAL RECORD NO.:  [id number]  LOCATION:  GYN                          FACILITY:  [facility]  PHYSICIAN:  De Blanch, M.D.DATE OF BIRTH:  08/21/1950  DATE OF CONSULTATION: DATE OF DISCHARGE:                                CONSULTATION   CHIEF COMPLAINT:  Recurrent cervical cancer.  INTERVAL HISTORY:  The patient returns today now having had a followup PET scan on August 6th.  Unfortunately the scan shows progressive disease.  The dome of the liver (2.8 cm) and increasing inguinal node on the right, which measures 2.5 x 1.8 cm.  In addition, there is a left common femoral node measuring 2.3 cm.  These were all progressive or new since her prior scan in April 2012.  The patient's main concern is that she is having intermittent hematuria.  She apparently had a urine culture performed by her primary care physician who did not find any evidence of an infection.  It is recognized that the patient has a ureteral stent in and has been present for many years.  Otherwise the patient's functional status is good.  She denies any GI or GU symptoms. She has no pelvic pain, pressure vaginal bleeding or discharge.  HISTORY OF PRESENT ILLNESS:  The patient had a radical hysterectomy, bilateral salpingo-oophorectomy, and pelvic lymphadenectomy in March 2005 for stage IB cervical cancer.  Final pathology showed no residual disease and all lymph nodes were negative.  She was followed for 5 years with no evidence of disease until she developed recurrence in the right pelvic sidewall in February 2010.  This caused obstruction of the right ureter and a stent was placed.  She was treated with radiation therapy and concurrent cisplatin chemotherapy.  In late 2011, the patient was found to have recurrence in the right inguinal node and left pelvic node and received additional radiation therapy to those areas.  This treatment was  completed in January 2012.  A PET scan in April 2012 showed resolution of uptake in the right inguinal node and uptake in the left external iliac node.  A followup CT scan was performed in August for reassessment.  PAST MEDICAL HISTORY:  Medical illnesses, obesity, atrial fibrillation, depression, glaucoma, GERD, hyperlipidemia, hypertension, arthritis and obesity.  PAST SURGICAL HISTORY:  Cold knife conization, radical hysterectomy, pelvic lymphadenectomy, carpal tunnel release, ureteral stent replacements, tonsil and adenoidectomy, colonoscopy.  DRUG ALLERGIES:  MORPHINE and CODEINE.  CURRENT MEDICATIONS:  Atenolol, lisinopril, Coumadin, Prilosec, aspirin, multivitamins.  SOCIAL HISTORY:  The patient is married.  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  She does not smoke.  REVIEW OF SYSTEMS:  A 10-point comprehensive review of systems negative except as noted above.  PHYSICAL EXAM:  VITAL SIGNS:  Weight 238 pounds, blood pressure 116/68, pulse 68, temperature 98.1. GENERAL:  The patient is a pleasant but anxious white female in no acute distress. HEENT:  Negative. NECK:  Supple without thyromegaly.  There is no supraclavicular adenopathy.  There is a 2.5-cm inguinal lymph node in the right that is easily palpable.  I do not palpate the left inguinal or femoral node. ABDOMEN:  The abdomen  is obese, soft, nontender.  No mass, organomegaly, ascites or hernias noted. LOWER EXTREMITIES:  Without edema or varicosities.  There is no CVA tenderness.  IMPRESSION: 1. Progressive cervical cancer in the dome of the liver, bilateral     inguinal nodes. 2. Hematuria.  PLAN:  I had a lengthy discussion with the patient about treatment options.  The inguinal regions have been previously radiated and therefore cannot receive any additional radiation therapy.  If we are going to treat at all, this will require systemic chemotherapy.  I would recommend that she be treated with  carboplatin and Taxol.  Pros and cons of this, expected side effects and the fact that complete responses are rare (especially in the radiated field) were all discussed with the patient.  After considering her options, she would like to strongly consider chemotherapy and we will arrange for her to see Dr. Darrold Span for further discussion.  With regard to her hematuria, I have encouraged her to return to see a urologist.  Dr. Aldean Ast is retired.  We would suggest that she see one of the other members of his group.     De Blanch, M.D.     DC/MEDQ  D:  10/06/2010  T:  10/07/2010  Job:  [id number]  cc:   Telford Nab, R.N. [address] [place], [place] [address]  Billie Lade, Ph.D., M.D. Fax: [phone]  Reece Packer, M.D. Fax: [phone]  [facility]  Electronically Signed by De Blanch M.D. on 10/08/2010 11:27:55 AM

## 2010-10-13 ENCOUNTER — Telehealth: Payer: Self-pay | Admitting: Internal Medicine

## 2010-10-13 NOTE — Telephone Encounter (Signed)
Called pt and spoke with her  She is having a stent replaced in her ureter and possibly place tube for kidney to help drain.  Since June(when stent was placed) has had blood in urine.  Then Chemo to start on 10/28/10 for 3 treatments over the next 3 months One treatment then break for 3 weeks

## 2010-10-13 NOTE — Telephone Encounter (Signed)
Dr office needs sur clearance to stop pradaxa five days before Oct 27, 2010.

## 2010-10-13 NOTE — Telephone Encounter (Signed)
[facility]  [facility] for them to call me back regarding what pt is having done

## 2010-10-16 NOTE — Telephone Encounter (Signed)
Form faxed over for clearance

## 2010-10-19 ENCOUNTER — Other Ambulatory Visit: Payer: Self-pay | Admitting: Urology

## 2010-10-19 ENCOUNTER — Encounter (HOSPITAL_COMMUNITY): Payer: 59

## 2010-10-19 DIAGNOSIS — F17200 Nicotine dependence, unspecified, uncomplicated: Secondary | ICD-10-CM

## 2010-10-19 DIAGNOSIS — R112 Nausea with vomiting, unspecified: Secondary | ICD-10-CM

## 2010-10-19 DIAGNOSIS — E870 Hyperosmolality and hypernatremia: Secondary | ICD-10-CM

## 2010-10-19 DIAGNOSIS — E1065 Type 1 diabetes mellitus with hyperglycemia: Secondary | ICD-10-CM

## 2010-10-19 LAB — CBC
MCH: 29.5 pg (ref 26.0–34.0)
MCHC: 32.9 g/dL (ref 30.0–36.0)
Platelets: 219 10*3/uL (ref 150–400)
RDW: 14.2 % (ref 11.5–15.5)

## 2010-10-19 LAB — SURGICAL PCR SCREEN
MRSA, PCR: NEGATIVE
Staphylococcus aureus: NEGATIVE

## 2010-10-19 LAB — BASIC METABOLIC PANEL
Calcium: 10.7 mg/dL — ABNORMAL HIGH (ref 8.4–10.5)
GFR calc Af Amer: 50 mL/min — ABNORMAL LOW (ref >60)
GFR calc non Af Amer: 42 mL/min — ABNORMAL LOW (ref >60)
Glucose, Bld: 95 mg/dL (ref 70–99)
Potassium: 4.2 mEq/L (ref 3.5–5.1)
Sodium: 140 mEq/L (ref 135–145)

## 2010-10-20 ENCOUNTER — Encounter (HOSPITAL_BASED_OUTPATIENT_CLINIC_OR_DEPARTMENT_OTHER): Payer: 59 | Admitting: Oncology

## 2010-10-20 ENCOUNTER — Other Ambulatory Visit: Payer: Self-pay | Admitting: Oncology

## 2010-10-20 DIAGNOSIS — C539 Malignant neoplasm of cervix uteri, unspecified: Secondary | ICD-10-CM

## 2010-10-20 DIAGNOSIS — C775 Secondary and unspecified malignant neoplasm of intrapelvic lymph nodes: Secondary | ICD-10-CM

## 2010-10-20 LAB — COMPREHENSIVE METABOLIC PANEL
ALT: 23 U/L (ref 0–35)
AST: 19 U/L (ref 0–37)
Chloride: 102 mEq/L (ref 96–112)
Creatinine, Ser: 1.38 mg/dL — ABNORMAL HIGH (ref 0.50–1.10)
Sodium: 139 mEq/L (ref 135–145)
Total Bilirubin: 0.4 mg/dL (ref 0.3–1.2)
Total Protein: 7.1 g/dL (ref 6.0–8.3)

## 2010-10-20 LAB — CBC WITH DIFFERENTIAL/PLATELET
BASO%: 0.7 % (ref 0.0–2.0)
EOS%: 3 % (ref 0.0–7.0)
HCT: 34.2 % — ABNORMAL LOW (ref 34.8–46.6)
LYMPH%: 19.4 % (ref 14.0–49.7)
MCH: 30.7 pg (ref 25.1–34.0)
MCHC: 34.7 g/dL (ref 31.5–36.0)
NEUT%: 63.6 % (ref 38.4–76.8)
RBC: 3.86 10*6/uL (ref 3.70–5.45)
WBC: 4.4 10*3/uL (ref 3.9–10.3)
lymph#: 0.8 10*3/uL — ABNORMAL LOW (ref 0.9–3.3)

## 2010-10-21 ENCOUNTER — Ambulatory Visit (HOSPITAL_COMMUNITY)
Admission: RE | Admit: 2010-10-21 | Discharge: 2010-10-21 | Disposition: A | Discharge status: Home / Self Care | Payer: 59 | Source: Ambulatory Visit | Attending: Urology | Admitting: Urology

## 2010-10-21 ENCOUNTER — Other Ambulatory Visit: Payer: Self-pay | Admitting: Urology

## 2010-10-21 DIAGNOSIS — I10 Essential (primary) hypertension: Secondary | ICD-10-CM | POA: Insufficient documentation

## 2010-10-21 DIAGNOSIS — E669 Obesity, unspecified: Secondary | ICD-10-CM | POA: Insufficient documentation

## 2010-10-21 DIAGNOSIS — Z01812 Encounter for preprocedural laboratory examination: Secondary | ICD-10-CM | POA: Insufficient documentation

## 2010-10-21 DIAGNOSIS — R319 Hematuria, unspecified: Secondary | ICD-10-CM | POA: Insufficient documentation

## 2010-10-21 DIAGNOSIS — I4891 Unspecified atrial fibrillation: Secondary | ICD-10-CM | POA: Insufficient documentation

## 2010-10-21 DIAGNOSIS — N3020 Other chronic cystitis without hematuria: Secondary | ICD-10-CM | POA: Insufficient documentation

## 2010-10-21 DIAGNOSIS — C539 Malignant neoplasm of cervix uteri, unspecified: Secondary | ICD-10-CM | POA: Insufficient documentation

## 2010-10-21 DIAGNOSIS — Z79899 Other long term (current) drug therapy: Secondary | ICD-10-CM | POA: Insufficient documentation

## 2010-10-21 DIAGNOSIS — N135 Crossing vessel and stricture of ureter without hydronephrosis: Secondary | ICD-10-CM | POA: Insufficient documentation

## 2010-10-21 LAB — ABO/RH: ABO/RH(D): A POS

## 2010-10-22 LAB — CROSSMATCH

## 2010-10-23 NOTE — Op Note (Signed)
[name], [name]                ACCOUNT NO.:  [id number]  MEDICAL RECORD NO.:  [id number]  LOCATION:  [facility]                         FACILITY:  [facility]  PHYSICIAN:  Natalia Leatherwood, MD    DATE OF BIRTH:  [birth year]  DATE OF PROCEDURE:  10/21/2010 DATE OF DISCHARGE:  10/21/2010                              OPERATIVE REPORT   PREOPERATIVE DIAGNOSES: 1. Hematuria. 2. Left ureteral obstruction.  POSTOPERATIVE DIAGNOSES: 1. Hematuria. 2. Left ureteral obstruction.  PROCEDURE PERFORMED:  Cystoscopy, bladder biopsy, fulguration of biopsy site, right retrograde pyelogram, right ureteroscopy, right ureteral stent placement with strings in place, left ureteral stent removal, left retrograde pyelogram.  FINDINGS:  No bleeding from the left ureter with stent removal.  No filling defects in bilateral collecting systems.  No tumors.  There was some bladder erythema and inflamed bladder, which was biopsied and sent for pathology.  ESTIMATED BLOOD LOSS:  Minimal.  SPECIMENS:  Bladder biopsy sent for pathology.  HISTORY OF PRESENT ILLNESS:  This is a pleasant 60 year old female, who has a history of cervical cancer with pelvic recurrences.  She has a left ureteral stricture and had a recurrent tumor close to  her left ureter.  She had focused radiation in this area and has been managed with indwelling stent.  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  It was recommended that she have procedure under anesthetic where we did look for any abnormal sites that could cause bleeding.  Also recommended that we have Interventional Radiology set up to be prepared to embolize should there be evidence of a fistula.  Risks and benefits were discussed in detail with the patient as detailed on the history of present illness of her H and P.  She presents for that procedure.  PROCEDURE:  After appropriate  informed consent was obtained, patient was taken to the operating room.  She was placed in supine position, IV antibiotics were infused.  General anesthesia was induced.  She was then placed in dorsal lithotomy position to make sure to pad all pertinent neurovascular pressure points.  Her genitals were prepped and draped in the usual sterile fashion.  Following this, 21-French rigid cystoscope was advanced through the urethra and the bladder.  Bladder was evaluated in a systematic fashion with 30-degree and 70-degree lens.  There were no lesions in the bladder, though there was erythema throughout the bladder likely secondary to her stent and previous radiation.  Site on the right lateral wall of the bladder was biopsied with cold cup biopsy forceps.  This specimen was sent to pathology for permanent section. Bugbee electrode was used to fulgurate the biopsy site.  The attention was then turned to the right ureter.  It was cannulated with 5-French open-ended Pollock catheter and retrograde pyelogram was obtained. There were no filling defects.  Two sensor tip wires were placed up with good curl on fluoroscopy.  Because the flexible ureteroscope was unable to be passed over the wire, a short ureteral access sheath was placed with ease into the midureter.  The obturator and wire then were removed and then the  digital flexible ureteroscope was able to be passed into the renal pelvis.  The renal pelvis was evaluated in the systematic fashion.  There were no lesions and then it was withdrawn slowly to the ureter.  There were no lesions throughout the ureter.  A 6 x 24 double-J ureteral stent was then placed up over the wire with a good curl on the right renal pelvis on fluoroscopy.  Strings were left on the stent.  Attention was then turned to the left ureter.  A Sensor tip wire was placed alongside the indwelling ureteral stent and then the stent was grasped with stent graspers and pulled  through the urethra meatus.  A second Sensor tip wire was placed up through the ureteral stent and a good curl was noted on the fluoroscopy and the renal pelvis.  The stent was then offloaded off the wire and there was no bleeding seen.  A retrograde pyelogram was obtained and there were no filling defects.  A flexible ureteroscopy was attempted, however, this was unsuccessful.  Given the nature of concern about the fistula, we did not proceed with ureteroscopy.  One wire was removed, there was no bleeding; and then a second wire was removed, again there was no bleeding.  This completed the procedure.  The cystoscope was removed and a 16-French Foley catheter was placed, and 10 cc of sterile water placed into the balloon.  The strings of the stent were secured to her mons pubis.  B and O suppository was placed in the rectum.  She tolerated the procedure well.  Anesthesia was reversed and she was taken to the PACU in stable condition.  There was no need for Interventional Radiology to embolize her iliac vessels.  Patient will follow up with me in approximately 1 week for catheter removal and stent removal.          ______________________________ Natalia Leatherwood, MD     DW/MEDQ  D:  10/21/2010  T:  10/22/2010  Job:  [id number]  Electronically Signed by Natalia Leatherwood MD on 10/23/2010 10:26:59 PM

## 2010-10-26 ENCOUNTER — Encounter (HOSPITAL_BASED_OUTPATIENT_CLINIC_OR_DEPARTMENT_OTHER): Payer: 59 | Admitting: Oncology

## 2010-10-26 ENCOUNTER — Ambulatory Visit (INDEPENDENT_AMBULATORY_CARE_PROVIDER_SITE_OTHER): Payer: 59 | Admitting: Internal Medicine

## 2010-10-26 ENCOUNTER — Ambulatory Visit
Admission: RE | Admit: 2010-10-26 | Discharge: 2010-10-26 | Disposition: A | Discharge status: Home / Self Care | Payer: 59 | Source: Ambulatory Visit | Attending: Radiation Oncology | Admitting: Radiation Oncology

## 2010-10-26 ENCOUNTER — Encounter: Payer: Self-pay | Admitting: Internal Medicine

## 2010-10-26 ENCOUNTER — Other Ambulatory Visit: Payer: Self-pay | Admitting: Oncology

## 2010-10-26 DIAGNOSIS — I5032 Chronic diastolic (congestive) heart failure: Secondary | ICD-10-CM

## 2010-10-26 DIAGNOSIS — I10 Essential (primary) hypertension: Secondary | ICD-10-CM

## 2010-10-26 DIAGNOSIS — C775 Secondary and unspecified malignant neoplasm of intrapelvic lymph nodes: Secondary | ICD-10-CM

## 2010-10-26 DIAGNOSIS — I509 Heart failure, unspecified: Secondary | ICD-10-CM

## 2010-10-26 DIAGNOSIS — C539 Malignant neoplasm of cervix uteri, unspecified: Secondary | ICD-10-CM

## 2010-10-26 DIAGNOSIS — I4891 Unspecified atrial fibrillation: Secondary | ICD-10-CM

## 2010-10-26 LAB — BASIC METABOLIC PANEL
BUN: 20 mg/dL (ref 6–23)
Chloride: 100 mEq/L (ref 96–112)
Potassium: 3.9 mEq/L (ref 3.5–5.3)
Sodium: 137 mEq/L (ref 135–145)

## 2010-10-26 NOTE — Assessment & Plan Note (Signed)
Stable. No changes today. 

## 2010-10-26 NOTE — Assessment & Plan Note (Signed)
Very refractory afib We will rate control long term Presently she is doing well Her CHADS2 score is 1 (HTN).  Given recent bleeding, I have stopped pradaxa and we will treat long term with ASA

## 2010-10-26 NOTE — Patient Instructions (Signed)
Your physician wants you to follow-up in: 6 months with Dr. Allred. You will receive a reminder letter in the mail two months in advance. If you don't receive a letter, please call our office to schedule the follow-up appointment.  

## 2010-10-26 NOTE — Assessment & Plan Note (Signed)
Stable No change required today  

## 2010-10-26 NOTE — Progress Notes (Signed)
The patient presents today for routine electrophysiology followup.  Since last being seen in our clinic, the patient reports doing reasonably well.   She had been loaded with amiodarone but failed cardioversion.  I will therefore treat with rate control long term.  She had further urinary bleeding with pradaxa and this was therefore stopped. Her bleeding subsequently improved.  She plans to restart chemotherapy in several weeks. Today, she denies symptoms of palpitations, chest pain, shortness of breath, orthopnea, PND, lower extremity edema, dizziness, presyncope, syncope, or neurologic sequela.  The patient feels that she is tolerating medications without difficulties and is otherwise without complaint today.   Past Medical History  Diagnosis Date  . Diastolic dysfunction   . MIGRAINE HEADACHE   . Atrial fibrillation     persistent  . Anemia   . HTN (hypertension)   . GLAUCOMA   . Depression   . CARPAL TUNNEL SYNDROME, RIGHT   . Lymphedema     L>R leg from pelvic XRT/scarring  . Cervical cancer dx 2005, recur 04/2008    s/p chemo/XRT; recurrent dz L pelvis dx 2010  . Hepatic steatosis     CT scan 06/2009, 12/2009   Past Surgical History  Procedure Date  . Total abdominal hysterectomy   . Cholecystectomy   . Ureteral stent placement 2005, 01/2010, 07/2010    L hydro related to cervical ca and XRT  . Oophorectomy   . Cardioversion   . Tonsillectomy     Current Outpatient Prescriptions  Medication Sig Dispense Refill  . diltiazem (CARDIZEM CD) 120 MG 24 hr capsule Take 120 mg by mouth daily.       . furosemide (LASIX) 40 MG tablet Take 1 tablet (40 mg total) by mouth daily.  30 tablet  6  . metoprolol (LOPRESSOR) 50 MG tablet Take 1 tablet (50 mg total) by mouth 2 (two) times daily.  60 tablet  11  . Multiple Vitamin (MULTIVITAMIN) tablet Take 1 tablet by mouth daily.        [name] omeprazole (PRILOSEC) 20 MG capsule Take 20 mg by mouth daily.        [name] oxybutynin (DITROPAN) 5 MG tablet  Take 1 tablet by mouth. Take every 6-8 hours as needed for spasms      . oxyCODONE-acetaminophen (PERCOCET) 5-325 MG per tablet Take 1-2 tablets as needed for pain every 4 hours by mouth      . potassium chloride SA (K-DUR,KLOR-CON) 20 MEQ tablet Take 20 mEq by mouth daily.         Allergies  Allergen Reactions  . Codeine     REACTION: Breathing issues and rash 40 years ago  . Morphine     REACTION: Hallucinations    History   Social History  . Marital Status: Single    Spouse Name: N/A    Number of Children: N/A  . Years of Education: N/A   Occupational History  . Not on file.   Social History Main Topics  . Smoking status: Former Smoker    Quit date: 03/01/1984  . Smokeless tobacco: Not on file  . Alcohol Use: No  . Drug Use: No  . Sexually Active: Not on file   Other Topics Concern  . 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 Lung cancer    .  Hypertension    . Heart disease    . Stroke    . Asthma    . Asthma Son     Physical Exam: Filed Vitals:   10/26/10 1524  BP: 106/72  Pulse: 96  Height: 5' 1.5" (1.562 m)  Weight: 237 lb (107.502 kg)    GEN- The patient is well appearing, alert and oriented x 3 today.   Head- normocephalic, atraumatic Eyes-  Sclera clear, conjunctiva pink Ears- hearing intact Oropharynx- clear Neck- supple, no JVP Lymph- no cervical lymphadenopathy Lungs- Clear to ausculation bilaterally, normal work of breathing Heart- irregular rate and rhythm, no murmurs, rubs or gallops, PMI not laterally displaced GI- soft, NT, ND, + BS Extremities- no clubbing, cyanosis, trace edema MS- no significant deformity or atrophy Skin- no rash or lesion Psych- euthymic mood, full affect Neuro- strength and sensation are intact  Assessment and Plan:

## 2010-10-28 ENCOUNTER — Encounter (HOSPITAL_BASED_OUTPATIENT_CLINIC_OR_DEPARTMENT_OTHER): Payer: 59 | Admitting: Oncology

## 2010-10-28 DIAGNOSIS — C775 Secondary and unspecified malignant neoplasm of intrapelvic lymph nodes: Secondary | ICD-10-CM

## 2010-10-28 DIAGNOSIS — Z5111 Encounter for antineoplastic chemotherapy: Secondary | ICD-10-CM

## 2010-10-28 DIAGNOSIS — C539 Malignant neoplasm of cervix uteri, unspecified: Secondary | ICD-10-CM

## 2010-10-31 DIAGNOSIS — I82409 Acute embolism and thrombosis of unspecified deep veins of unspecified lower extremity: Secondary | ICD-10-CM

## 2010-10-31 HISTORY — DX: Acute embolism and thrombosis of unspecified deep veins of unspecified lower extremity: I82.409

## 2010-11-04 ENCOUNTER — Encounter (HOSPITAL_BASED_OUTPATIENT_CLINIC_OR_DEPARTMENT_OTHER): Payer: 59 | Admitting: Oncology

## 2010-11-04 ENCOUNTER — Other Ambulatory Visit: Payer: Self-pay | Admitting: Oncology

## 2010-11-04 DIAGNOSIS — Z5189 Encounter for other specified aftercare: Secondary | ICD-10-CM

## 2010-11-04 DIAGNOSIS — C539 Malignant neoplasm of cervix uteri, unspecified: Secondary | ICD-10-CM

## 2010-11-04 DIAGNOSIS — C775 Secondary and unspecified malignant neoplasm of intrapelvic lymph nodes: Secondary | ICD-10-CM

## 2010-11-04 LAB — CBC WITH DIFFERENTIAL/PLATELET
BASO%: 2.1 % — ABNORMAL HIGH (ref 0.0–2.0)
LYMPH%: 28.4 % (ref 14.0–49.7)
MCHC: 34 g/dL (ref 31.5–36.0)
MCV: 86.1 fL (ref 79.5–101.0)
MONO#: 0 10*3/uL — ABNORMAL LOW (ref 0.1–0.9)
MONO%: 1.6 % (ref 0.0–14.0)
Platelets: 165 10*3/uL (ref 145–400)
RBC: 3.73 10*6/uL (ref 3.70–5.45)
RDW: 13.8 % (ref 11.2–14.5)
WBC: 1.9 10*3/uL — ABNORMAL LOW (ref 3.9–10.3)
nRBC: 0 % (ref 0–0)

## 2010-11-04 LAB — BASIC METABOLIC PANEL
Calcium: 9.2 mg/dL (ref 8.4–10.5)
Creatinine, Ser: 1.58 mg/dL — ABNORMAL HIGH (ref 0.50–1.10)
Sodium: 137 mEq/L (ref 135–145)

## 2010-11-10 ENCOUNTER — Other Ambulatory Visit: Payer: Self-pay | Admitting: Oncology

## 2010-11-10 ENCOUNTER — Encounter (HOSPITAL_BASED_OUTPATIENT_CLINIC_OR_DEPARTMENT_OTHER): Payer: 59 | Admitting: Oncology

## 2010-11-10 DIAGNOSIS — C775 Secondary and unspecified malignant neoplasm of intrapelvic lymph nodes: Secondary | ICD-10-CM

## 2010-11-10 DIAGNOSIS — C539 Malignant neoplasm of cervix uteri, unspecified: Secondary | ICD-10-CM

## 2010-11-10 LAB — MANUAL DIFFERENTIAL
ALC: 1.2 10*3/uL (ref 0.9–3.3)
ANC (CHCC manual diff): 18.2 10*3/uL — ABNORMAL HIGH (ref 1.5–6.5)
Band Neutrophils: 9 % (ref 0–10)
LYMPH: 6 % — ABNORMAL LOW (ref 14–49)
Myelocytes: 1 % — ABNORMAL HIGH (ref 0–0)
PLT EST: DECREASED
SEG: 79 % — ABNORMAL HIGH (ref 38–77)

## 2010-11-10 LAB — CBC WITH DIFFERENTIAL/PLATELET
HCT: 32.6 % — ABNORMAL LOW (ref 34.8–46.6)
HGB: 10.7 g/dL — ABNORMAL LOW (ref 11.6–15.9)

## 2010-11-17 ENCOUNTER — Other Ambulatory Visit: Payer: Self-pay | Admitting: Oncology

## 2010-11-17 ENCOUNTER — Encounter (HOSPITAL_BASED_OUTPATIENT_CLINIC_OR_DEPARTMENT_OTHER): Payer: 59 | Admitting: Oncology

## 2010-11-17 DIAGNOSIS — C775 Secondary and unspecified malignant neoplasm of intrapelvic lymph nodes: Secondary | ICD-10-CM

## 2010-11-17 DIAGNOSIS — C539 Malignant neoplasm of cervix uteri, unspecified: Secondary | ICD-10-CM

## 2010-11-17 DIAGNOSIS — M7989 Other specified soft tissue disorders: Secondary | ICD-10-CM

## 2010-11-17 LAB — CBC WITH DIFFERENTIAL/PLATELET
BASO%: 0.4 % (ref 0.0–2.0)
LYMPH%: 11.8 % — ABNORMAL LOW (ref 14.0–49.7)
MCHC: 35.3 g/dL (ref 31.5–36.0)
MONO#: 0.4 10*3/uL (ref 0.1–0.9)
Platelets: 184 10*3/uL (ref 145–400)
RBC: 3.38 10*6/uL — ABNORMAL LOW (ref 3.70–5.45)
WBC: 6.7 10*3/uL (ref 3.9–10.3)

## 2010-11-17 LAB — COMPREHENSIVE METABOLIC PANEL
ALT: 18 U/L (ref 0–35)
AST: 14 U/L (ref 0–37)
Calcium: 9.5 mg/dL (ref 8.4–10.5)
Chloride: 104 mEq/L (ref 96–112)
Creatinine, Ser: 1.85 mg/dL — ABNORMAL HIGH (ref 0.50–1.10)
Sodium: 141 mEq/L (ref 135–145)
Total Protein: 6.5 g/dL (ref 6.0–8.3)

## 2010-11-18 ENCOUNTER — Other Ambulatory Visit: Payer: Self-pay | Admitting: Oncology

## 2010-11-18 ENCOUNTER — Ambulatory Visit (HOSPITAL_COMMUNITY)
Admission: RE | Admit: 2010-11-18 | Discharge: 2010-11-18 | Disposition: A | Discharge status: Home / Self Care | Payer: 59 | Source: Ambulatory Visit | Attending: Oncology | Admitting: Oncology

## 2010-11-18 DIAGNOSIS — I824Y9 Acute embolism and thrombosis of unspecified deep veins of unspecified proximal lower extremity: Secondary | ICD-10-CM

## 2010-11-18 DIAGNOSIS — M7989 Other specified soft tissue disorders: Secondary | ICD-10-CM

## 2010-11-18 DIAGNOSIS — M79609 Pain in unspecified limb: Secondary | ICD-10-CM

## 2010-11-23 ENCOUNTER — Other Ambulatory Visit: Payer: Self-pay | Admitting: Oncology

## 2010-11-23 ENCOUNTER — Encounter (HOSPITAL_BASED_OUTPATIENT_CLINIC_OR_DEPARTMENT_OTHER): Payer: 59 | Admitting: Oncology

## 2010-11-23 DIAGNOSIS — C775 Secondary and unspecified malignant neoplasm of intrapelvic lymph nodes: Secondary | ICD-10-CM

## 2010-11-23 DIAGNOSIS — C539 Malignant neoplasm of cervix uteri, unspecified: Secondary | ICD-10-CM

## 2010-11-23 DIAGNOSIS — I824Y9 Acute embolism and thrombosis of unspecified deep veins of unspecified proximal lower extremity: Secondary | ICD-10-CM

## 2010-11-23 LAB — PROTIME-INR
INR: 1.9 — ABNORMAL LOW (ref 2.00–3.50)
Protime: 22.8 Seconds — ABNORMAL HIGH (ref 10.6–13.4)

## 2010-11-25 ENCOUNTER — Other Ambulatory Visit: Payer: Self-pay | Admitting: Oncology

## 2010-11-25 ENCOUNTER — Encounter (HOSPITAL_BASED_OUTPATIENT_CLINIC_OR_DEPARTMENT_OTHER): Payer: 59 | Admitting: Oncology

## 2010-11-25 DIAGNOSIS — C539 Malignant neoplasm of cervix uteri, unspecified: Secondary | ICD-10-CM

## 2010-11-25 DIAGNOSIS — Z5111 Encounter for antineoplastic chemotherapy: Secondary | ICD-10-CM

## 2010-11-25 DIAGNOSIS — C775 Secondary and unspecified malignant neoplasm of intrapelvic lymph nodes: Secondary | ICD-10-CM

## 2010-11-25 LAB — CBC WITH DIFFERENTIAL/PLATELET
BASO%: 0.7 % (ref 0.0–2.0)
Basophils Absolute: 0 10*3/uL (ref 0.0–0.1)
Eosinophils Absolute: 0.1 10*3/uL (ref 0.0–0.5)
LYMPH%: 26 % (ref 14.0–49.7)
MCH: 31.2 pg (ref 25.1–34.0)
MCHC: 35.9 g/dL (ref 31.5–36.0)
MCV: 86.9 fL (ref 79.5–101.0)
MONO#: 0.4 10*3/uL (ref 0.1–0.9)
NEUT#: 1.7 10*3/uL (ref 1.5–6.5)
NEUT%: 55.5 % (ref 38.4–76.8)
lymph#: 0.8 10*3/uL — ABNORMAL LOW (ref 0.9–3.3)

## 2010-11-25 LAB — BASIC METABOLIC PANEL
BUN: 16 mg/dL (ref 6–23)
Creatinine, Ser: 1.46 mg/dL — ABNORMAL HIGH (ref 0.50–1.10)
Potassium: 3.2 mEq/L — ABNORMAL LOW (ref 3.5–5.3)

## 2010-11-25 LAB — PROTIME-INR
INR: 3.1 (ref 2.00–3.50)
Protime: 37.2 Seconds — ABNORMAL HIGH (ref 10.6–13.4)

## 2010-11-26 ENCOUNTER — Encounter (HOSPITAL_BASED_OUTPATIENT_CLINIC_OR_DEPARTMENT_OTHER): Payer: 59 | Admitting: Oncology

## 2010-11-26 DIAGNOSIS — Z5111 Encounter for antineoplastic chemotherapy: Secondary | ICD-10-CM

## 2010-11-26 DIAGNOSIS — C539 Malignant neoplasm of cervix uteri, unspecified: Secondary | ICD-10-CM

## 2010-11-26 DIAGNOSIS — C775 Secondary and unspecified malignant neoplasm of intrapelvic lymph nodes: Secondary | ICD-10-CM

## 2010-12-01 ENCOUNTER — Emergency Department (HOSPITAL_COMMUNITY)
Admission: EM | Admit: 2010-12-01 | Discharge: 2010-12-01 | Disposition: A | Discharge status: Home / Self Care | Payer: 59 | Attending: Emergency Medicine | Admitting: Emergency Medicine

## 2010-12-01 DIAGNOSIS — C539 Malignant neoplasm of cervix uteri, unspecified: Secondary | ICD-10-CM | POA: Insufficient documentation

## 2010-12-01 DIAGNOSIS — Z7901 Long term (current) use of anticoagulants: Secondary | ICD-10-CM | POA: Insufficient documentation

## 2010-12-01 DIAGNOSIS — Z79899 Other long term (current) drug therapy: Secondary | ICD-10-CM | POA: Insufficient documentation

## 2010-12-01 DIAGNOSIS — I10 Essential (primary) hypertension: Secondary | ICD-10-CM | POA: Insufficient documentation

## 2010-12-01 DIAGNOSIS — I4891 Unspecified atrial fibrillation: Secondary | ICD-10-CM | POA: Insufficient documentation

## 2010-12-01 DIAGNOSIS — R5381 Other malaise: Secondary | ICD-10-CM | POA: Insufficient documentation

## 2010-12-01 LAB — URINALYSIS, ROUTINE W REFLEX MICROSCOPIC
Bilirubin Urine: NEGATIVE
Glucose, UA: NEGATIVE mg/dL
Ketones, ur: NEGATIVE mg/dL
Protein, ur: NEGATIVE mg/dL

## 2010-12-01 LAB — URINE MICROSCOPIC-ADD ON

## 2010-12-01 LAB — COMPREHENSIVE METABOLIC PANEL
ALT: 29 U/L (ref 0–35)
AST: 14 U/L (ref 0–37)
Albumin: 3.7 g/dL (ref 3.5–5.2)
Alkaline Phosphatase: 111 U/L (ref 39–117)
Chloride: 96 mEq/L (ref 96–112)
Potassium: 4.6 mEq/L (ref 3.5–5.1)
Total Bilirubin: 0.4 mg/dL (ref 0.3–1.2)

## 2010-12-01 LAB — PROTIME-INR
INR: 3.33 — ABNORMAL HIGH (ref 0.00–1.49)
Prothrombin Time: 34.3 seconds — ABNORMAL HIGH (ref 11.6–15.2)

## 2010-12-01 LAB — DIFFERENTIAL
Basophils Absolute: 0 10*3/uL (ref 0.0–0.1)
Basophils Relative: 0 % (ref 0–1)
Lymphocytes Relative: 25 % (ref 12–46)
Monocytes Relative: 2 % — ABNORMAL LOW (ref 3–12)
Neutro Abs: 1.8 10*3/uL (ref 1.7–7.7)
Neutrophils Relative %: 67 % (ref 43–77)

## 2010-12-01 LAB — CBC
HCT: 32.4 % — ABNORMAL LOW (ref 36.0–46.0)
Hemoglobin: 10.8 g/dL — ABNORMAL LOW (ref 12.0–15.0)
RBC: 3.69 MIL/uL — ABNORMAL LOW (ref 3.87–5.11)

## 2010-12-03 ENCOUNTER — Other Ambulatory Visit: Payer: Self-pay | Admitting: Oncology

## 2010-12-03 ENCOUNTER — Encounter (HOSPITAL_BASED_OUTPATIENT_CLINIC_OR_DEPARTMENT_OTHER): Payer: 59 | Admitting: Oncology

## 2010-12-03 DIAGNOSIS — C539 Malignant neoplasm of cervix uteri, unspecified: Secondary | ICD-10-CM

## 2010-12-03 DIAGNOSIS — C775 Secondary and unspecified malignant neoplasm of intrapelvic lymph nodes: Secondary | ICD-10-CM

## 2010-12-03 DIAGNOSIS — Z5189 Encounter for other specified aftercare: Secondary | ICD-10-CM

## 2010-12-03 DIAGNOSIS — Z5111 Encounter for antineoplastic chemotherapy: Secondary | ICD-10-CM

## 2010-12-03 LAB — PROTIME-INR: Protime: 48 Seconds — ABNORMAL HIGH (ref 10.6–13.4)

## 2010-12-08 ENCOUNTER — Encounter (HOSPITAL_BASED_OUTPATIENT_CLINIC_OR_DEPARTMENT_OTHER): Payer: 59 | Admitting: Oncology

## 2010-12-08 ENCOUNTER — Other Ambulatory Visit: Payer: Self-pay | Admitting: Oncology

## 2010-12-08 DIAGNOSIS — C539 Malignant neoplasm of cervix uteri, unspecified: Secondary | ICD-10-CM

## 2010-12-08 DIAGNOSIS — N289 Disorder of kidney and ureter, unspecified: Secondary | ICD-10-CM

## 2010-12-08 DIAGNOSIS — I4891 Unspecified atrial fibrillation: Secondary | ICD-10-CM

## 2010-12-08 DIAGNOSIS — C775 Secondary and unspecified malignant neoplasm of intrapelvic lymph nodes: Secondary | ICD-10-CM

## 2010-12-08 DIAGNOSIS — Z5111 Encounter for antineoplastic chemotherapy: Secondary | ICD-10-CM

## 2010-12-08 LAB — COMPREHENSIVE METABOLIC PANEL
AST: 19 U/L (ref 0–37)
Alkaline Phosphatase: 169 U/L — ABNORMAL HIGH (ref 39–117)
Glucose, Bld: 145 mg/dL — ABNORMAL HIGH (ref 70–99)
Potassium: 3.5 mEq/L (ref 3.5–5.3)
Sodium: 141 mEq/L (ref 135–145)
Total Bilirubin: 0.3 mg/dL (ref 0.3–1.2)
Total Protein: 6.7 g/dL (ref 6.0–8.3)

## 2010-12-08 LAB — CBC WITH DIFFERENTIAL/PLATELET
BASO%: 0.5 % (ref 0.0–2.0)
EOS%: 0.5 % (ref 0.0–7.0)
LYMPH%: 6.7 % — ABNORMAL LOW (ref 14.0–49.7)
MCH: 30.5 pg (ref 25.1–34.0)
MCHC: 34.5 g/dL (ref 31.5–36.0)
MCV: 88.3 fL (ref 79.5–101.0)
MONO%: 4.8 % (ref 0.0–14.0)
Platelets: 129 10*3/uL — ABNORMAL LOW (ref 145–400)
RBC: 3.36 10*6/uL — ABNORMAL LOW (ref 3.70–5.45)
RDW: 17.3 % — ABNORMAL HIGH (ref 11.2–14.5)

## 2010-12-08 LAB — PROTIME-INR
INR: 2.7 (ref 2.00–3.50)
Protime: 32.4 Seconds — ABNORMAL HIGH (ref 10.6–13.4)

## 2010-12-09 ENCOUNTER — Other Ambulatory Visit: Payer: Self-pay | Admitting: *Deleted

## 2010-12-09 MED ORDER — DILTIAZEM HCL ER COATED BEADS 120 MG PO CP24: 120.0000 mg | ORAL_CAPSULE | Freq: Every day | ORAL | Status: DC

## 2010-12-15 ENCOUNTER — Encounter (HOSPITAL_BASED_OUTPATIENT_CLINIC_OR_DEPARTMENT_OTHER): Payer: 59 | Admitting: Oncology

## 2010-12-15 ENCOUNTER — Other Ambulatory Visit: Payer: Self-pay | Admitting: Oncology

## 2010-12-15 DIAGNOSIS — Z5111 Encounter for antineoplastic chemotherapy: Secondary | ICD-10-CM

## 2010-12-15 DIAGNOSIS — C539 Malignant neoplasm of cervix uteri, unspecified: Secondary | ICD-10-CM

## 2010-12-15 DIAGNOSIS — C775 Secondary and unspecified malignant neoplasm of intrapelvic lymph nodes: Secondary | ICD-10-CM

## 2010-12-15 LAB — BASIC METABOLIC PANEL
BUN: 17 mg/dL (ref 6–23)
Chloride: 100 mEq/L (ref 96–112)
Glucose, Bld: 126 mg/dL — ABNORMAL HIGH (ref 70–99)
Potassium: 3.7 mEq/L (ref 3.5–5.3)

## 2010-12-15 LAB — CBC WITH DIFFERENTIAL/PLATELET
Basophils Absolute: 0 10*3/uL (ref 0.0–0.1)
Eosinophils Absolute: 0.1 10*3/uL (ref 0.0–0.5)
HCT: 29.7 % — ABNORMAL LOW (ref 34.8–46.6)
HGB: 10.2 g/dL — ABNORMAL LOW (ref 11.6–15.9)
MCV: 88 fL (ref 79.5–101.0)
MONO%: 6 % (ref 0.0–14.0)
NEUT#: 5.1 10*3/uL (ref 1.5–6.5)
NEUT%: 78.4 % — ABNORMAL HIGH (ref 38.4–76.8)
RDW: 17.4 % — ABNORMAL HIGH (ref 11.2–14.5)
lymph#: 0.9 10*3/uL (ref 0.9–3.3)

## 2010-12-15 LAB — PROTIME-INR: INR: 3.8 — ABNORMAL HIGH (ref 2.00–3.50)

## 2010-12-17 ENCOUNTER — Encounter (HOSPITAL_BASED_OUTPATIENT_CLINIC_OR_DEPARTMENT_OTHER): Payer: 59 | Admitting: Oncology

## 2010-12-17 DIAGNOSIS — Z5111 Encounter for antineoplastic chemotherapy: Secondary | ICD-10-CM

## 2010-12-17 DIAGNOSIS — C539 Malignant neoplasm of cervix uteri, unspecified: Secondary | ICD-10-CM

## 2010-12-17 DIAGNOSIS — C775 Secondary and unspecified malignant neoplasm of intrapelvic lymph nodes: Secondary | ICD-10-CM

## 2010-12-22 ENCOUNTER — Ambulatory Visit (INDEPENDENT_AMBULATORY_CARE_PROVIDER_SITE_OTHER): Payer: 59 | Admitting: Internal Medicine

## 2010-12-22 ENCOUNTER — Encounter: Payer: Self-pay | Admitting: Internal Medicine

## 2010-12-22 DIAGNOSIS — Z8541 Personal history of malignant neoplasm of cervix uteri: Secondary | ICD-10-CM

## 2010-12-22 DIAGNOSIS — N269 Renal sclerosis, unspecified: Secondary | ICD-10-CM

## 2010-12-22 DIAGNOSIS — N261 Atrophy of kidney (terminal): Secondary | ICD-10-CM | POA: Insufficient documentation

## 2010-12-22 DIAGNOSIS — I82409 Acute embolism and thrombosis of unspecified deep veins of unspecified lower extremity: Secondary | ICD-10-CM

## 2010-12-22 DIAGNOSIS — I10 Essential (primary) hypertension: Secondary | ICD-10-CM

## 2010-12-22 DIAGNOSIS — I4891 Unspecified atrial fibrillation: Secondary | ICD-10-CM

## 2010-12-22 NOTE — Patient Instructions (Signed)
It was good to see you today. We have reviewed your prior records including labs and tests today Medications reviewed, no changes at this time. Please schedule followup in 3-4 months for review, call sooner if problems.

## 2010-12-22 NOTE — Assessment & Plan Note (Signed)
The current medical regimen is effective;  continue present plan and medications. BP Readings from Last 3 Encounters:  12/22/10 100/72  10/26/10 106/72  09/15/10 112/72

## 2010-12-22 NOTE — Assessment & Plan Note (Signed)
Difficult med control DCCV 07/2010 - briefly off Pradaxa in 09/2010>but now coumadin due LLE DVT 10/2010

## 2010-12-22 NOTE — Assessment & Plan Note (Signed)
Dx 04/2003 s/p chemo/debulk/XRT Recurrent dx 04/2009 - resumed chemo 09/2010 Multiple complications related to same and renal involvement/obstruction

## 2010-12-22 NOTE — Progress Notes (Signed)
  Subjective:    Patient ID: [name], female    DOB: [birth year], 60 y.o.   MRN: [id number]  HPI here for follow up - reviewed chronic medical issues/interval hx:  AF with RVR - difficult mgmt of same, s/p DCCV 09/2010 - briefly off anticoag (pradaxa) due to bleeding complications (hematuria) and low CHADs - but back on anticoag due to LLE DVT 10/2010 - expect lifelong anticoag -No chest pain or palpitations  LLE DVT - dx 10/2010 - following with cancer center [facility] for coumadin- no new swelling in chronic LLE lymphedema, chest pain or shortness of breath   Cervical ca - resumed chemo XRT 09/2010 - complicated by renal insuff  Past Medical History  Diagnosis Date  . Diastolic dysfunction   . MIGRAINE HEADACHE   . Atrial fibrillation   . Anemia   . HTN (hypertension)   . GLAUCOMA   . Depression   . CARPAL TUNNEL SYNDROME, RIGHT   . Lymphedema     L>R leg from pelvic XRT/scarring  . Cervical cancer dx 2005, recur 04/2008    s/p chemo/XRT; recurrent dz L pelvis dx 2010  . Hepatic steatosis     CT scan 06/2009, 12/2009  . DVT (deep venous thrombosis) 10/2010    LLE    Review of Systems  Constitutional: Negative for diaphoresis.  Cardiovascular: Negative for chest pain and palpitations.  Genitourinary: Positive for hematuria.  Musculoskeletal: Positive for back pain.       Objective:   Physical Exam  BP 100/72  Pulse 102  Temp(Src) 98.3 F (36.8 C) (Oral)  Ht 5\' 3"  (1.6 m)  Wt 228 lb 6.4 oz (103.602 kg)  BMI 40.46 kg/m2  SpO2 98%  Constitutional: She is obese but appears well-developed and well-nourished. No distress. Cardiovascular: Irregular rate and rhythm - normal heart sounds.  No murmur heard. trace chronic BLE edema, L>R. Pulmonary/Chest: Effort normal and breath sounds normal. No respiratory distress. She has no wheezes.  Abd: obese, S, NTND, no flank tenderness Psychiatric: She has a normal but slightly depressed/anxious mood and affect. Her behavior is normal.  Judgment and thought content normal.   Lab Results  Component Value Date   WBC 2.7* 12/01/2010   HGB 10.2* 12/15/2010   HCT 29.7* 12/15/2010   PLT 208 12/15/2010   ALT 29 12/08/2010   AST 19 12/08/2010   NA 137 12/15/2010   K 3.7 12/15/2010   CL 100 12/15/2010   CREATININE 1.24* 12/15/2010   BUN 17 12/15/2010   CO2 26 12/15/2010   TSH 1.700 05/16/2010   INR 3.80* 12/15/2010   HGBA1C 5.2 09/19/2008        Assessment & Plan:  See problem list. Medications and labs reviewed today. Time spent with pt today 25 minutes, greater than 50% time spent counseling patient on DVT, anticoag, cancer treatment and depression + medication review. Also review of interval hx/records

## 2010-12-22 NOTE — Assessment & Plan Note (Signed)
Hx prior ureteral stent summer 2012 due to stricture (XRT induced) causing renal insuff Now with atropic changes and rising Cr, limiting chemo tx Follows with uro for same - no plans for perc nephrostomy or resection at this time Cr trends followed by onc Lab Results  Component Value Date   CREATININE 1.24* 12/15/2010

## 2010-12-22 NOTE — Assessment & Plan Note (Signed)
LLE DVT 10/2010 - on resumed coumadin for same - follows at cancer center

## 2010-12-24 ENCOUNTER — Encounter (HOSPITAL_BASED_OUTPATIENT_CLINIC_OR_DEPARTMENT_OTHER): Payer: 59 | Admitting: Oncology

## 2010-12-24 DIAGNOSIS — C775 Secondary and unspecified malignant neoplasm of intrapelvic lymph nodes: Secondary | ICD-10-CM

## 2010-12-24 DIAGNOSIS — C539 Malignant neoplasm of cervix uteri, unspecified: Secondary | ICD-10-CM

## 2010-12-24 DIAGNOSIS — Z5189 Encounter for other specified aftercare: Secondary | ICD-10-CM

## 2010-12-29 ENCOUNTER — Other Ambulatory Visit: Payer: Self-pay | Admitting: Oncology

## 2010-12-29 ENCOUNTER — Encounter (HOSPITAL_BASED_OUTPATIENT_CLINIC_OR_DEPARTMENT_OTHER): Payer: 59 | Admitting: Oncology

## 2010-12-29 DIAGNOSIS — C775 Secondary and unspecified malignant neoplasm of intrapelvic lymph nodes: Secondary | ICD-10-CM

## 2010-12-29 DIAGNOSIS — I824Y9 Acute embolism and thrombosis of unspecified deep veins of unspecified proximal lower extremity: Secondary | ICD-10-CM

## 2010-12-29 DIAGNOSIS — C539 Malignant neoplasm of cervix uteri, unspecified: Secondary | ICD-10-CM

## 2010-12-29 DIAGNOSIS — Z5111 Encounter for antineoplastic chemotherapy: Secondary | ICD-10-CM

## 2010-12-29 LAB — COMPREHENSIVE METABOLIC PANEL
Alkaline Phosphatase: 139 U/L — ABNORMAL HIGH (ref 39–117)
Glucose, Bld: 123 mg/dL — ABNORMAL HIGH (ref 70–99)
Sodium: 140 mEq/L (ref 135–145)
Total Bilirubin: 0.4 mg/dL (ref 0.3–1.2)
Total Protein: 6.2 g/dL (ref 6.0–8.3)

## 2010-12-29 LAB — MANUAL DIFFERENTIAL
Band Neutrophils: 5 % (ref 0–10)
Basophil: 1 % (ref 0–2)
EOS: 1 % (ref 0–7)
LYMPH: 7 % — ABNORMAL LOW (ref 14–49)
MONO: 11 % (ref 0–14)
PLT EST: ADEQUATE
nRBC: 2 % — ABNORMAL HIGH (ref 0–0)

## 2010-12-29 LAB — CBC WITH DIFFERENTIAL/PLATELET
HCT: 29.8 % — ABNORMAL LOW (ref 34.8–46.6)
HGB: 9.7 g/dL — ABNORMAL LOW (ref 11.6–15.9)
MCH: 29.1 pg (ref 25.1–34.0)
MCV: 89.5 fL (ref 79.5–101.0)

## 2011-01-07 ENCOUNTER — Encounter (HOSPITAL_COMMUNITY): Payer: Self-pay

## 2011-01-07 ENCOUNTER — Encounter (HOSPITAL_COMMUNITY)
Admission: RE | Admit: 2011-01-07 | Discharge: 2011-01-07 | Disposition: A | Discharge status: Home / Self Care | Payer: 59 | Source: Ambulatory Visit | Attending: Oncology | Admitting: Oncology

## 2011-01-07 DIAGNOSIS — C539 Malignant neoplasm of cervix uteri, unspecified: Secondary | ICD-10-CM | POA: Insufficient documentation

## 2011-01-07 MED ORDER — FLUDEOXYGLUCOSE F - 18 (FDG) INJECTION
18.9000 | Freq: Once | INTRAVENOUS | Status: AC | PRN
  Administered 2011-01-07: 18.9 via INTRAVENOUS

## 2011-01-12 ENCOUNTER — Telehealth: Payer: Self-pay

## 2011-01-12 NOTE — Telephone Encounter (Signed)
SPOKE WITH MS. Wissing AND TOLD HER THAT THE PET SCAN SHOWS IMPROVEMENT IN AREAS OF THE CANCER PER DR. Darrold Span.  IT DOES SHOW SLIGHT FULNESS IN THE RIGHT RENAL  COLLECTING SYSTEM THAT IS NEW.  SHE WILL DISCUSS THIS WITH HER AT HER VISIT TOMORROW 01-13-11.

## 2011-01-13 ENCOUNTER — Other Ambulatory Visit: Payer: Self-pay | Admitting: Oncology

## 2011-01-13 ENCOUNTER — Ambulatory Visit (HOSPITAL_BASED_OUTPATIENT_CLINIC_OR_DEPARTMENT_OTHER): Payer: 59 | Admitting: Oncology

## 2011-01-13 ENCOUNTER — Other Ambulatory Visit (HOSPITAL_BASED_OUTPATIENT_CLINIC_OR_DEPARTMENT_OTHER): Payer: 59 | Admitting: Lab

## 2011-01-13 VITALS — BP 121/75 | HR 91 | Temp 97.7°F | Ht 61.5 in | Wt 234.4 lb

## 2011-01-13 DIAGNOSIS — D6481 Anemia due to antineoplastic chemotherapy: Secondary | ICD-10-CM

## 2011-01-13 DIAGNOSIS — D72829 Elevated white blood cell count, unspecified: Secondary | ICD-10-CM

## 2011-01-13 DIAGNOSIS — R599 Enlarged lymph nodes, unspecified: Secondary | ICD-10-CM

## 2011-01-13 DIAGNOSIS — C539 Malignant neoplasm of cervix uteri, unspecified: Secondary | ICD-10-CM

## 2011-01-13 DIAGNOSIS — I82409 Acute embolism and thrombosis of unspecified deep veins of unspecified lower extremity: Secondary | ICD-10-CM

## 2011-01-13 DIAGNOSIS — I4891 Unspecified atrial fibrillation: Secondary | ICD-10-CM

## 2011-01-13 LAB — CBC WITH DIFFERENTIAL/PLATELET
Eosinophils Absolute: 0.1 10*3/uL (ref 0.0–0.5)
LYMPH%: 17.3 % (ref 14.0–49.7)
MONO#: 0.4 10*3/uL (ref 0.1–0.9)
NEUT#: 1.9 10*3/uL (ref 1.5–6.5)
Platelets: 209 10*3/uL (ref 145–400)
RBC: 3.07 10*6/uL — ABNORMAL LOW (ref 3.70–5.45)
RDW: 20.7 % — ABNORMAL HIGH (ref 11.2–14.5)
WBC: 2.9 10*3/uL — ABNORMAL LOW (ref 3.9–10.3)

## 2011-01-13 LAB — BASIC METABOLIC PANEL
CO2: 25 mEq/L (ref 19–32)
Chloride: 104 mEq/L (ref 96–112)
Glucose, Bld: 110 mg/dL — ABNORMAL HIGH (ref 70–99)
Potassium: 3.7 mEq/L (ref 3.5–5.3)
Sodium: 140 mEq/L (ref 135–145)

## 2011-01-13 LAB — PROTIME-INR: Protime: 31.2 Seconds — ABNORMAL HIGH (ref 10.6–13.4)

## 2011-01-14 ENCOUNTER — Ambulatory Visit: Payer: 59

## 2011-01-14 ENCOUNTER — Telehealth: Payer: Self-pay | Admitting: *Deleted

## 2011-01-14 DIAGNOSIS — C539 Malignant neoplasm of cervix uteri, unspecified: Secondary | ICD-10-CM

## 2011-01-14 MED ORDER — DEXAMETHASONE 4 MG PO TABS: 4.0000 mg | ORAL_TABLET | ORAL | Status: DC

## 2011-01-15 ENCOUNTER — Telehealth: Payer: Self-pay | Admitting: Oncology

## 2011-01-15 ENCOUNTER — Telehealth: Payer: Self-pay

## 2011-01-15 NOTE — Telephone Encounter (Signed)
Called pt left message appt with Dr Oren Binet on Monday 11/19 11am, Lymphedema clinic will call pt to set up appt.

## 2011-01-15 NOTE — Telephone Encounter (Signed)
Called Lymphedema clinic, they wats a script  Fax to them [phone], then they will call pt to set up appt

## 2011-01-15 NOTE — Telephone Encounter (Signed)
FAXED ORDER FOR LYMPHEDEMA PT. EVALUATION AND TREATMENT FOR LLE. DX: METASTATIC GYN CA. DVT ON COUMADIN.

## 2011-01-16 NOTE — Progress Notes (Signed)
Office Progress Note Date of Visit 01-13-11  Patient is seen, alone for visit today, in continuing attention to her metastatic squamous cell carcinoma of cervix, back on chemotherapy since 10-28-10 for progression in previously irradiated pelvic nodes and also dome of liver. She had taxol + [place] for first cycle, then taxol only for cycles 2 & 3 due to urologic concerns. She had restaging PET at [facility] on Nov.8, 2012, which showed improvement in all areas seen on the PET of Aug.6, 2012 and no new areas of uptake. Specifically, the lesion within the dome of liver measures 1.6 cm with SUV 4.2 as compared with 2.2cm and Suv 6.6 previously; right groin LN now 1.7 cm with SUV 2.6 as compared with 1.8cm and SUV 5.0 previously; left common femoral LN now 1.4 cm with SUV 3.5 as compared with 1.1cm with SUV 7.0. The scan shows known atrophy of the left kidney and new mild hydronephrosis of the right kidney.  We have discussed PET information as above, all encouraging other than the changes with the right kidney; we will ask Dr.Woodruff to see her back in this regard.  Ms.Wale has felt fairly well recently and enjoyed a trip to the beach with family. Fatigue persists, which she manages by reducing activity. She has had slight clear rhinorrhea, family members with URI symptoms on the beach trip. She has not had fever or lower respiratory symptoms. She saw ophthalmology and had recheck for glaucoma next week. She has no neuropathy symptoms in hands, but does have slight tingling in feet which is improved over just after last taxol and is not interfering with activity. She has had no bleeding, still on coumadin for the LLE DVT (also afib). Her LLE swells significantly by end of day and she would like to see lymphedema PT, which we will set up.  Review of Systems: otherwise no SOB or chest pain, bowels and bladder ok, no swelling RLE, no nausea or vomiting, taste still not normal tho improved. Remainder of full 10 point ROS  negative.  Physical Exam:  Weight 234 lbs which is stable. 121/75, 91 irregular  20/ not labored RA, 97.7  Alert, looks comfortable, easily mobile in exam room, very pleasant as always. Alopecia. PERRL. Oral mucosa clear and moist, posterior pharynx with dull erythema/ no exudate. Nares clear. Lungs clear to A&P. Cor irregular without gallop. No central catheter. Abd soft, nontender, positive BS, no HSM or masses. LLE 2+ edema to thigh, RLE no swelling. Neuro nonfocal. Skin without bruises or petechiae.  Labs today:  WBC 2.9  ANC 1.9  Hgb 9.6  plt 209 PT 31.2  INR 2.6 BMET: Na 140, K 3.7, Glu 110, BUN 12, creat 1.2   Impression/Plan: 1. Metastatic squamous cell ca cervix:  Initially IB disease in March 2005 treated with radical hysterectomy  by Dr.John Kyla Balzarine, then left pelvic sidewall recurrence Feb.2010 treated with radiation with sensitizing CDDP, then progression in pelvic nodes treated with additional RT + sensitizing CDDP in early 2012, and now on systemic chemotherapy for progression in pelvic nodes + dome of liver. With improvement noted on PET, she is in agreement with continuing taxol q 3 weeks probably for at least 6 cycles. She will have cycle 4 on 01-22-11 with neulasta 01-29-11 and I will see her back at least 02-02-11 with CBC/CMET/PT, or sooner if needed. 2. Atrophic left kidney and new mild right hydronephrosis noted on PET: will ask Dr.Woodruff to see back 3. LLE DVT Sept. 2012: INR therapeutic today  and she will continue same coumadin, repeat PT/INR at least Dec 4. 4. Atrial fibrillation followed by Dr. Hillis Range. She was previously on coumadin for this indication, but had had this DC'd prior to developing the LLE DVT. 5. Chemo anemia, on oral iron. Will follow  Jama Flavors, MD

## 2011-01-18 ENCOUNTER — Telehealth: Payer: Self-pay

## 2011-01-18 NOTE — Telephone Encounter (Signed)
SPOKE WITH MS. [name] AND TOLD HER THAT SHE WAS CORRECT IN THAT HER NEULASTA INJECTION IS NOT ON 01-22-11 WITH CHEMO.  IT SHOULD BE 01-29-11 PER DR. Precious Reel PROGRESS NOTE ON 01-13-11.  REQUESTED THAT WHEN SHE COMES IN FOR CHEMO ON 01-22-11 THAT SHE SCHEDULE APPT. FOR INJECTION ON 01-29-11.

## 2011-01-20 ENCOUNTER — Other Ambulatory Visit: Payer: Self-pay | Admitting: Oncology

## 2011-01-22 ENCOUNTER — Telehealth: Payer: Self-pay | Admitting: Oncology

## 2011-01-22 ENCOUNTER — Other Ambulatory Visit (HOSPITAL_BASED_OUTPATIENT_CLINIC_OR_DEPARTMENT_OTHER): Payer: 59 | Admitting: Lab

## 2011-01-22 ENCOUNTER — Ambulatory Visit (HOSPITAL_BASED_OUTPATIENT_CLINIC_OR_DEPARTMENT_OTHER): Payer: 59

## 2011-01-22 VITALS — BP 125/72 | HR 82 | Temp 97.0°F

## 2011-01-22 DIAGNOSIS — C775 Secondary and unspecified malignant neoplasm of intrapelvic lymph nodes: Secondary | ICD-10-CM

## 2011-01-22 DIAGNOSIS — C539 Malignant neoplasm of cervix uteri, unspecified: Secondary | ICD-10-CM

## 2011-01-22 DIAGNOSIS — Z5111 Encounter for antineoplastic chemotherapy: Secondary | ICD-10-CM

## 2011-01-22 LAB — CBC WITH DIFFERENTIAL/PLATELET
BASO%: 0 % (ref 0.0–2.0)
EOS%: 0 % (ref 0.0–7.0)
HCT: 33.6 % — ABNORMAL LOW (ref 34.8–46.6)
LYMPH%: 16.3 % (ref 14.0–49.7)
MCH: 29.7 pg (ref 25.1–34.0)
MCHC: 33.3 g/dL (ref 31.5–36.0)
MONO#: 0 10*3/uL — ABNORMAL LOW (ref 0.1–0.9)
NEUT%: 83.1 % — ABNORMAL HIGH (ref 38.4–76.8)
RBC: 3.77 10*6/uL (ref 3.70–5.45)
WBC: 3.1 10*3/uL — ABNORMAL LOW (ref 3.9–10.3)
lymph#: 0.5 10*3/uL — ABNORMAL LOW (ref 0.9–3.3)

## 2011-01-22 MED ORDER — DEXAMETHASONE SODIUM PHOSPHATE 4 MG/ML IJ SOLN
20.0000 mg | Freq: Once | INTRAMUSCULAR | Status: AC
  Administered 2011-01-22: 20 mg via INTRAVENOUS

## 2011-01-22 MED ORDER — SODIUM CHLORIDE 0.9 % IV SOLN
Freq: Once | INTRAVENOUS | Status: AC
  Administered 2011-01-22: 09:00:00 via INTRAVENOUS

## 2011-01-22 MED ORDER — DIPHENHYDRAMINE HCL 50 MG/ML IJ SOLN
50.0000 mg | Freq: Once | INTRAMUSCULAR | Status: AC
  Administered 2011-01-22: 50 mg via INTRAVENOUS

## 2011-01-22 MED ORDER — FAMOTIDINE IN NACL 20-0.9 MG/50ML-% IV SOLN
20.0000 mg | Freq: Once | INTRAVENOUS | Status: AC
  Administered 2011-01-22: 20 mg via INTRAVENOUS

## 2011-01-22 MED ORDER — PACLITAXEL CHEMO INJECTION 300 MG/50ML
135.0000 mg/m2 | Freq: Once | INTRAVENOUS | Status: AC
  Administered 2011-01-22: 288 mg via INTRAVENOUS
  Filled 2011-01-22: qty 48

## 2011-01-22 MED ORDER — ONDANSETRON 8 MG/50ML IVPB (CHCC)
8.0000 mg | Freq: Once | INTRAVENOUS | Status: AC
  Administered 2011-01-22: 8 mg via INTRAVENOUS

## 2011-01-22 NOTE — Telephone Encounter (Signed)
Called Lyphedema clinic,left message, making sure that appt has been made. Talke to [facility] RN for Dr Darrold Span, script has been faxed.

## 2011-01-27 ENCOUNTER — Other Ambulatory Visit: Payer: Self-pay | Admitting: Oncology

## 2011-01-29 ENCOUNTER — Ambulatory Visit (HOSPITAL_BASED_OUTPATIENT_CLINIC_OR_DEPARTMENT_OTHER): Payer: 59

## 2011-01-29 VITALS — BP 120/80 | HR 102 | Temp 98.2°F

## 2011-01-29 DIAGNOSIS — C775 Secondary and unspecified malignant neoplasm of intrapelvic lymph nodes: Secondary | ICD-10-CM

## 2011-01-29 DIAGNOSIS — C539 Malignant neoplasm of cervix uteri, unspecified: Secondary | ICD-10-CM

## 2011-01-29 MED ORDER — PEGFILGRASTIM INJECTION 6 MG/0.6ML
6.0000 mg | Freq: Once | SUBCUTANEOUS | Status: AC
  Administered 2011-01-29: 6 mg via SUBCUTANEOUS
  Filled 2011-01-29: qty 0.6

## 2011-02-02 ENCOUNTER — Other Ambulatory Visit (HOSPITAL_BASED_OUTPATIENT_CLINIC_OR_DEPARTMENT_OTHER): Payer: 59 | Admitting: Lab

## 2011-02-02 ENCOUNTER — Ambulatory Visit (HOSPITAL_BASED_OUTPATIENT_CLINIC_OR_DEPARTMENT_OTHER): Payer: 59 | Admitting: Oncology

## 2011-02-02 ENCOUNTER — Ambulatory Visit: Payer: 59

## 2011-02-02 VITALS — BP 132/78 | HR 116 | Temp 97.8°F | Ht 61.5 in | Wt 234.2 lb

## 2011-02-02 DIAGNOSIS — C539 Malignant neoplasm of cervix uteri, unspecified: Secondary | ICD-10-CM

## 2011-02-02 DIAGNOSIS — I82409 Acute embolism and thrombosis of unspecified deep veins of unspecified lower extremity: Secondary | ICD-10-CM

## 2011-02-02 DIAGNOSIS — C775 Secondary and unspecified malignant neoplasm of intrapelvic lymph nodes: Secondary | ICD-10-CM

## 2011-02-02 DIAGNOSIS — J329 Chronic sinusitis, unspecified: Secondary | ICD-10-CM

## 2011-02-02 DIAGNOSIS — C7800 Secondary malignant neoplasm of unspecified lung: Secondary | ICD-10-CM

## 2011-02-02 LAB — CBC WITH DIFFERENTIAL/PLATELET
Basophils Absolute: 0 10*3/uL (ref 0.0–0.1)
HCT: 31.4 % — ABNORMAL LOW (ref 34.8–46.6)
HGB: 10.5 g/dL — ABNORMAL LOW (ref 11.6–15.9)
MONO#: 2.5 10*3/uL — ABNORMAL HIGH (ref 0.1–0.9)
NEUT%: 81.2 % — ABNORMAL HIGH (ref 38.4–76.8)
Platelets: 151 10*3/uL (ref 145–400)
WBC: 21.5 10*3/uL — ABNORMAL HIGH (ref 3.9–10.3)
lymph#: 1.4 10*3/uL (ref 0.9–3.3)

## 2011-02-02 LAB — COMPREHENSIVE METABOLIC PANEL
AST: 27 U/L (ref 0–37)
BUN: 14 mg/dL (ref 6–23)
CO2: 24 mEq/L (ref 19–32)
Calcium: 9.4 mg/dL (ref 8.4–10.5)
Chloride: 102 mEq/L (ref 96–112)
Creatinine, Ser: 1.21 mg/dL — ABNORMAL HIGH (ref 0.50–1.10)
Total Bilirubin: 0.4 mg/dL (ref 0.3–1.2)

## 2011-02-02 LAB — PROTIME-INR

## 2011-02-02 MED ORDER — AZITHROMYCIN 250 MG PO TABS: ORAL_TABLET | ORAL | Status: AC

## 2011-02-02 NOTE — Progress Notes (Signed)
OFFICE PROGRESS NOTE INTERVAL HISTORY:   Date of visit Feb 02, 2011     Physicians: D.Margarita Grizzle, Shela Commons.Allred,D.ClarkePearson, V.Leschber, J.Kinard  Patient is seen, alsone for visit today, in continuing attentiion to her metastatic squamous cell carcinoma of cervix, continuing treatment with taxol which we have used single agent because of renal function since first cycle with taxol + carboplatin. She had repeat PET after 3 cycles of chemotherapy on Nov 8, this with improvement obvious; plan now is to continue for total 6 cycles then repeat scans. Because of concern for some new right hydronephrosis on the PET, she was seen back by urology, without evidence of this on that reevaluation. She had 4th chemotherapy treatment (#3 single agent taxol) on Nov 23 with neulasta Nov 30. She felt similar to previous cycles with aches and some nausea, then initial sinus symptoms thought allergic until last pm when she had temperature to 101 without shaking chills, purulent sinus drainage and some minimal nosebleeding. She had slight right flank discomfort last pm which has resolved; she has had no fever today. She has been using only claritin for the sinus symptoms. She has no lower respiratory symptoms and no other bleeding. She has not set up appointment with lymphedema PT yet, tho that office has been in touch with her.  History is of IB squamous cell carcinnoma of cervix diagnosed in March 2005 treated with radical hysterectomy by Dr.John Kyla Balzarine, then left pelvic sidewall recurrence Feb.2010 treated with radiation with sensitizing CDDP, then progression in pelvic nodes treated with additional RT + sensitizing CDDP in early 2012, and now on systemic chemotherapy for progression in pelvic nodes + dome of liver.  Review of Systems: No ear pain. Appetite ok and trying to push fluids. Bowels moving adequately. No bladder symptoms. No change in swelling LLE. Remainder of 10 point ROS negative.  Objective: Alert, nasally  congested, looks mildly ill, easily mobile in office. Vital signs in last 24 hours:  BP 132/78  Pulse 116  Temp(Src) 97.8 F (36.6 C) (Oral)  Ht 5' 1.5" (1.562 m)  Wt 234 lb 3.2 oz (106.232 kg)  BMI 43.53 kg/m2    HEENT:mucous membranes moist, pharynx normal without lesions.PERR. Nasal turbinates boggy with purulent drainage. Left TM retracted and slightly erythematous, right TM good light reflex. LymphaticsCervical, supraclavicular, and axillary nodes normal. Resp: clear to auscultation bilaterally and normal percussion bilaterally Cardio: regular rate and rhythm GI: full, soft, nontender, some BS. No clear organomegaly or mass Extremities: venous stasis dermatitis noted and LLE 2-3+ edema to thigh no weeping. RLE no edema/cords/tenderness     Lab Results:   Basename 02/02/11 1436  WBC 21.5*  HGB 10.5*  HCT 31.4*  PLT 151   WBC is post neulasta 11-30 BMET not repeated today INR 2.2  No change in coumadin dose.  Studies/Results:  No results found.  Medications: I have reviewed the patient's current medications.  Assessment/Plan:  1. Metastatic squamous cell ca cervix: response to one cycle taxol+carbo then 2 cycles taxol, with taxol continuing.She will be due cycle 5 on Dec 14 if acute sinusitis symptoms have resolved; she will have neulasta a week later on Dec.21. She will see midlevel on 12-28 prior to cycle 6 taxol on Jan 4. We may try a couple of days of neupogen instead of full neulasta after cycle 6. I will see her again at least Jan 18, then probably PET/CT and follow up with gyn onc. 2. Acute sinusitis: Z pack called to pharmacy, add mucinex, stop claritin.  Push fluids. 3. LLE DVT on coumadin: INR low normal today which should be ok for antibiotic above. Will repeat INR with rx 12-14, with midlevel visit 12-28, and with my visit Jan18. 4. Atrophic left kidney: mild new right hydronephrosis questioned on recent PET not confirmed by urology. Left ureter is no  longer stented. 5. Atrial fibrillation, HTN, elevated cholesterol followed by Dr.Allred       Reece Packer, MD   02/02/2011, 8:20 PM

## 2011-02-04 ENCOUNTER — Telehealth: Payer: Self-pay | Admitting: Oncology

## 2011-02-04 ENCOUNTER — Other Ambulatory Visit: Payer: Self-pay

## 2011-02-04 NOTE — Telephone Encounter (Signed)
S/w the pt regarding her dec 2012 appt calendar and to pick up the rest of her appts when shw comes in for that appt. Pt is also aware of the ct scan/pet scan appt and the appt with dr Stanford Breed.

## 2011-02-12 ENCOUNTER — Ambulatory Visit: Payer: Self-pay

## 2011-02-12 ENCOUNTER — Ambulatory Visit (HOSPITAL_BASED_OUTPATIENT_CLINIC_OR_DEPARTMENT_OTHER): Payer: 59

## 2011-02-12 ENCOUNTER — Other Ambulatory Visit (HOSPITAL_BASED_OUTPATIENT_CLINIC_OR_DEPARTMENT_OTHER): Payer: 59 | Admitting: Lab

## 2011-02-12 ENCOUNTER — Other Ambulatory Visit: Payer: Self-pay | Admitting: Oncology

## 2011-02-12 DIAGNOSIS — C779 Secondary and unspecified malignant neoplasm of lymph node, unspecified: Secondary | ICD-10-CM

## 2011-02-12 DIAGNOSIS — C539 Malignant neoplasm of cervix uteri, unspecified: Secondary | ICD-10-CM

## 2011-02-12 DIAGNOSIS — C775 Secondary and unspecified malignant neoplasm of intrapelvic lymph nodes: Secondary | ICD-10-CM

## 2011-02-12 DIAGNOSIS — I82409 Acute embolism and thrombosis of unspecified deep veins of unspecified lower extremity: Secondary | ICD-10-CM

## 2011-02-12 DIAGNOSIS — Z5111 Encounter for antineoplastic chemotherapy: Secondary | ICD-10-CM

## 2011-02-12 DIAGNOSIS — C649 Malignant neoplasm of unspecified kidney, except renal pelvis: Secondary | ICD-10-CM

## 2011-02-12 LAB — CBC WITH DIFFERENTIAL/PLATELET
Basophils Absolute: 0.1 10*3/uL (ref 0.0–0.1)
EOS%: 2 % (ref 0.0–7.0)
Eosinophils Absolute: 0.1 10*3/uL (ref 0.0–0.5)
HCT: 30.9 % — ABNORMAL LOW (ref 34.8–46.6)
HGB: 10.1 g/dL — ABNORMAL LOW (ref 11.6–15.9)
MCH: 29.2 pg (ref 25.1–34.0)
MCV: 89.3 fL (ref 79.5–101.0)
MONO%: 8.6 % (ref 0.0–14.0)
NEUT#: 2.7 10*3/uL (ref 1.5–6.5)
NEUT%: 66.9 % (ref 38.4–76.8)
Platelets: 272 10*3/uL (ref 145–400)

## 2011-02-12 LAB — PROTIME-INR: INR: 1.7 — ABNORMAL LOW (ref 2.00–3.50)

## 2011-02-12 MED ORDER — ONDANSETRON 8 MG/50ML IVPB (CHCC)
8.0000 mg | Freq: Once | INTRAVENOUS | Status: AC
  Administered 2011-02-12: 8 mg via INTRAVENOUS

## 2011-02-12 MED ORDER — SODIUM CHLORIDE 0.9 % IJ SOLN
10.0000 mL | INTRAMUSCULAR | Status: DC | PRN
  Filled 2011-02-12: qty 10

## 2011-02-12 MED ORDER — FAMOTIDINE IN NACL 20-0.9 MG/50ML-% IV SOLN
20.0000 mg | Freq: Once | INTRAVENOUS | Status: AC
  Administered 2011-02-12: 20 mg via INTRAVENOUS

## 2011-02-12 MED ORDER — PACLITAXEL CHEMO INJECTION 300 MG/50ML
135.0000 mg/m2 | Freq: Once | INTRAVENOUS | Status: AC
  Administered 2011-02-12: 288 mg via INTRAVENOUS
  Filled 2011-02-12: qty 48

## 2011-02-12 MED ORDER — SODIUM CHLORIDE 0.9 % IV SOLN
Freq: Once | INTRAVENOUS | Status: AC
  Administered 2011-02-12: 12:00:00 via INTRAVENOUS

## 2011-02-12 MED ORDER — DEXAMETHASONE SODIUM PHOSPHATE 4 MG/ML IJ SOLN
20.0000 mg | Freq: Once | INTRAMUSCULAR | Status: AC
  Administered 2011-02-12: 20 mg via INTRAVENOUS

## 2011-02-12 MED ORDER — HEPARIN SOD (PORK) LOCK FLUSH 100 UNIT/ML IV SOLN
500.0000 [IU] | Freq: Once | INTRAVENOUS | Status: DC | PRN
  Filled 2011-02-12: qty 5

## 2011-02-12 MED ORDER — DIPHENHYDRAMINE HCL 50 MG/ML IJ SOLN
50.0000 mg | Freq: Once | INTRAMUSCULAR | Status: AC
  Administered 2011-02-12: 50 mg via INTRAVENOUS

## 2011-02-12 NOTE — Patient Instructions (Signed)
TAKE 5MG  TONIGHT AND SAT 02-13-11 THE 5MG  M-W-F AND 2.5MG  THE OTHER DAYS.

## 2011-02-12 NOTE — Patient Instructions (Signed)
Patient aware of next appointment; medication for nausea and pain at home.

## 2011-02-19 ENCOUNTER — Ambulatory Visit (HOSPITAL_BASED_OUTPATIENT_CLINIC_OR_DEPARTMENT_OTHER): Payer: 59

## 2011-02-19 VITALS — BP 126/78 | HR 112 | Temp 98.4°F

## 2011-02-19 DIAGNOSIS — Z5189 Encounter for other specified aftercare: Secondary | ICD-10-CM

## 2011-02-19 DIAGNOSIS — C775 Secondary and unspecified malignant neoplasm of intrapelvic lymph nodes: Secondary | ICD-10-CM

## 2011-02-19 DIAGNOSIS — C539 Malignant neoplasm of cervix uteri, unspecified: Secondary | ICD-10-CM

## 2011-02-19 MED ORDER — PEGFILGRASTIM INJECTION 6 MG/0.6ML
6.0000 mg | Freq: Once | SUBCUTANEOUS | Status: AC
  Administered 2011-02-19: 6 mg via SUBCUTANEOUS
  Filled 2011-02-19: qty 0.6

## 2011-02-26 ENCOUNTER — Ambulatory Visit (HOSPITAL_BASED_OUTPATIENT_CLINIC_OR_DEPARTMENT_OTHER): Payer: 59 | Admitting: Physician Assistant

## 2011-02-26 ENCOUNTER — Ambulatory Visit: Payer: 59 | Admitting: Physician Assistant

## 2011-02-26 ENCOUNTER — Other Ambulatory Visit (HOSPITAL_BASED_OUTPATIENT_CLINIC_OR_DEPARTMENT_OTHER): Payer: 59

## 2011-02-26 ENCOUNTER — Encounter: Payer: Self-pay | Admitting: Physician Assistant

## 2011-02-26 ENCOUNTER — Other Ambulatory Visit: Payer: 59 | Admitting: Lab

## 2011-02-26 VITALS — BP 129/91 | HR 115 | Temp 97.1°F | Ht 61.5 in | Wt 237.3 lb

## 2011-02-26 DIAGNOSIS — I82409 Acute embolism and thrombosis of unspecified deep veins of unspecified lower extremity: Secondary | ICD-10-CM

## 2011-02-26 DIAGNOSIS — C539 Malignant neoplasm of cervix uteri, unspecified: Secondary | ICD-10-CM

## 2011-02-26 DIAGNOSIS — C775 Secondary and unspecified malignant neoplasm of intrapelvic lymph nodes: Secondary | ICD-10-CM

## 2011-02-26 DIAGNOSIS — C787 Secondary malignant neoplasm of liver and intrahepatic bile duct: Secondary | ICD-10-CM

## 2011-02-26 DIAGNOSIS — Z79899 Other long term (current) drug therapy: Secondary | ICD-10-CM

## 2011-02-26 LAB — CBC WITH DIFFERENTIAL/PLATELET
Basophils Absolute: 0.1 10*3/uL (ref 0.0–0.1)
Eosinophils Absolute: 0.2 10*3/uL (ref 0.0–0.5)
HCT: 31.2 % — ABNORMAL LOW (ref 34.8–46.6)
LYMPH%: 6.5 % — ABNORMAL LOW (ref 14.0–49.7)
MCV: 89.2 fL (ref 79.5–101.0)
MONO%: 2.5 % (ref 0.0–14.0)
NEUT#: 15.9 10*3/uL — ABNORMAL HIGH (ref 1.5–6.5)
NEUT%: 89.2 % — ABNORMAL HIGH (ref 38.4–76.8)
Platelets: 177 10*3/uL (ref 145–400)
RBC: 3.5 10*6/uL — ABNORMAL LOW (ref 3.70–5.45)

## 2011-02-26 LAB — PROTIME-INR: Protime: 27.6 Seconds — ABNORMAL HIGH (ref 10.6–13.4)

## 2011-02-26 LAB — COMPREHENSIVE METABOLIC PANEL
Alkaline Phosphatase: 129 U/L — ABNORMAL HIGH (ref 39–117)
BUN: 15 mg/dL (ref 6–23)
CO2: 25 mEq/L (ref 19–32)
Creatinine, Ser: 1.22 mg/dL — ABNORMAL HIGH (ref 0.50–1.10)
Glucose, Bld: 112 mg/dL — ABNORMAL HIGH (ref 70–99)
Sodium: 140 mEq/L (ref 135–145)
Total Bilirubin: 0.4 mg/dL (ref 0.3–1.2)

## 2011-02-26 NOTE — Progress Notes (Signed)
OFFICE PROGRESS NOTE INTERVAL HISTORY:   Date of visit Feb 02, 2011     Physicians: D.Margarita Grizzle, Shela Commons.Allred,D.ClarkePearson, V.Leschber, J.Kinard  Patient is seen, alsone for visit today, in continuing attentiion to her metastatic squamous cell carcinoma of cervix, continuing treatment with taxol which we have used single agent because of renal function since first cycle with taxol + carboplatin. She had repeat PET after 3 cycles of chemotherapy on Nov 8, this with improvement obvious; plan now is to continue for total 6 cycles then repeat scans. Because of concern for some new right hydronephrosis on the PET, she was seen back by urology, without evidence of this on that reevaluation. She had 5th chemotherapy treatment (#4 single agent taxol) on Dec 14 with neulasta Dec 21. She states that the last 2 cycles have been very rough on her. She's had significant increase in fatigue and just not bounced back as with previous cycles. She has not set up appointment with lymphedema PT yet, tho that office has been in touch with her. She states that she has had a lot to deal with it just has not been able to get around to make an appointment with lymphedema clinic. The lymphedema primarily affects the left lower extremity.  History is of IB squamous cell carcinnoma of cervix diagnosed in March 2005 treated with radical hysterectomy by Dr.John Kyla Balzarine, then left pelvic sidewall recurrence Feb.2010 treated with radiation with sensitizing CDDP, then progression in pelvic nodes treated with additional RT + sensitizing CDDP in early 2012, and now on systemic chemotherapy for progression in pelvic nodes + dome of liver.  Review of Systems: . Appetite ok , able to push fluids without difficulty. Bowels moving adequately. No bladder symptoms. No change in swelling LLE. Significant increase in fatigue. Remainder of 10 point ROS negative.  Objective: Alert, nasally congested, looks mildly ill, easily mobile in office. Vital  signs in last 24 hours:  BP 129/91  Pulse 115  Temp(Src) 97.1 F (36.2 C) (Oral)  Ht 5' 1.5" (1.562 m)  Wt 237 lb 4.8 oz (107.639 kg)  BMI 44.11 kg/m2    HEENT:mucous membranes moist, pharynx normal without lesions. LymphaticsCervical, supraclavicular, and axillary nodes normal. Resp: clear to auscultation bilaterally and normal percussion bilaterally Cardio: regular rate and rhythm GI: full, soft, nontender, some BS. No clear organomegaly or mass Extremities: venous stasis dermatitis noted and LLE 2-3+ edema to thigh no weeping. RLE no edema/cords/tenderness     Lab Results:   Basename 02/26/11 1331  WBC 17.9*  HGB 10.8*  HCT 31.2*  PLT 177   WBC is post neulasta 11-30 BMET not repeated today INR 2.2  No change in coumadin dose.  Studies/Results:  No results found.  Medications: I have reviewed the patient's current medications.  Assessment/Plan:  1. Metastatic squamous cell ca cervix: response to one cycle taxol+carbo then 2 cycles taxol, with taxol continuing, now status post 5 cycles. The patient was discussed with Dr. Darrold Span. She will proceed with cycle #6 is scheduled on 03/05/2011. She was given the option of Neulasta one week after chemotherapy as with previous cycles or 3 days of Neupogen. The patient opted to stick with the Neulasta injection and this will be scheduled for 03/12/2011. She will will followup with Dr. Darrold Span as scheduled on Jan 18, with PET/CT on January 21 and follow up with gyn on, with Dr. Loree Fee on January 25 as previously scheduled.. 2. Acute sinusitis: Resolved 3. LLE DVT on coumadin: INR in the therapeutic range today.  She will continue her current dose of Coumadin. We'll repeat INR on 03/19/2011 with Dr. Darrold Span followup visit. 4. Atrophic left kidney: mild new right hydronephrosis questioned on recent PET not confirmed by urology. Left ureter is no longer stented. 5. Atrial fibrillation, HTN, elevated cholesterol followed by  Dr.Allred 6. left lower extremity lymphedema: Further referral to the lymphedema clinic will be postponed until after the patient is seen by Dr. Darrold Span on 03/19/2011.       Conni Slipper, PA-C   02/26/2011, 5:28 PM

## 2011-02-27 ENCOUNTER — Other Ambulatory Visit: Payer: Self-pay | Admitting: Oncology

## 2011-03-01 ENCOUNTER — Telehealth: Payer: Self-pay | Admitting: Oncology

## 2011-03-01 NOTE — Telephone Encounter (Signed)
lmonvm for pt re inj for 1/11. Pt to get new schedule when she comes in 1/4.

## 2011-03-05 ENCOUNTER — Ambulatory Visit: Payer: Self-pay

## 2011-03-05 ENCOUNTER — Other Ambulatory Visit: Payer: Self-pay

## 2011-03-05 ENCOUNTER — Ambulatory Visit (HOSPITAL_BASED_OUTPATIENT_CLINIC_OR_DEPARTMENT_OTHER): Payer: 59

## 2011-03-05 ENCOUNTER — Other Ambulatory Visit: Payer: Self-pay | Admitting: Oncology

## 2011-03-05 ENCOUNTER — Other Ambulatory Visit: Payer: 59 | Admitting: Lab

## 2011-03-05 VITALS — BP 113/73 | HR 121 | Temp 98.4°F

## 2011-03-05 DIAGNOSIS — I82409 Acute embolism and thrombosis of unspecified deep veins of unspecified lower extremity: Secondary | ICD-10-CM

## 2011-03-05 DIAGNOSIS — C539 Malignant neoplasm of cervix uteri, unspecified: Secondary | ICD-10-CM

## 2011-03-05 DIAGNOSIS — Z5111 Encounter for antineoplastic chemotherapy: Secondary | ICD-10-CM

## 2011-03-05 LAB — CBC WITH DIFFERENTIAL/PLATELET
Basophils Absolute: 0 10*3/uL (ref 0.0–0.1)
EOS%: 0 % (ref 0.0–7.0)
Eosinophils Absolute: 0 10*3/uL (ref 0.0–0.5)
HGB: 10.6 g/dL — ABNORMAL LOW (ref 11.6–15.9)
MONO%: 0.5 % (ref 0.0–14.0)
NEUT#: 5.1 10*3/uL (ref 1.5–6.5)
RBC: 3.62 10*6/uL — ABNORMAL LOW (ref 3.70–5.45)
RDW: 17.1 % — ABNORMAL HIGH (ref 11.2–14.5)
lymph#: 0.6 10*3/uL — ABNORMAL LOW (ref 0.9–3.3)

## 2011-03-05 LAB — PROTIME-INR
INR: 1.9 — ABNORMAL LOW (ref 2.00–3.50)
Protime: 22.8 Seconds — ABNORMAL HIGH (ref 10.6–13.4)

## 2011-03-05 MED ORDER — DEXAMETHASONE SODIUM PHOSPHATE 4 MG/ML IJ SOLN
20.0000 mg | Freq: Once | INTRAMUSCULAR | Status: AC
  Administered 2011-03-05: 20 mg via INTRAVENOUS

## 2011-03-05 MED ORDER — FAMOTIDINE IN NACL 20-0.9 MG/50ML-% IV SOLN
20.0000 mg | Freq: Once | INTRAVENOUS | Status: AC
Start: 2011-03-05 — End: 2011-03-05
  Administered 2011-03-05: 20 mg via INTRAVENOUS

## 2011-03-05 MED ORDER — PACLITAXEL CHEMO INJECTION 300 MG/50ML
135.0000 mg/m2 | Freq: Once | INTRAVENOUS | Status: AC
  Administered 2011-03-05: 288 mg via INTRAVENOUS
  Filled 2011-03-05: qty 48

## 2011-03-05 MED ORDER — DIPHENHYDRAMINE HCL 50 MG/ML IJ SOLN
50.0000 mg | Freq: Once | INTRAMUSCULAR | Status: AC
  Administered 2011-03-05: 50 mg via INTRAVENOUS

## 2011-03-05 MED ORDER — SODIUM CHLORIDE 0.9 % IV SOLN
Freq: Once | INTRAVENOUS | Status: AC
  Administered 2011-03-05: 10:00:00 via INTRAVENOUS

## 2011-03-05 MED ORDER — ONDANSETRON 8 MG/50ML IVPB (CHCC)
8.0000 mg | Freq: Once | INTRAVENOUS | Status: AC
  Administered 2011-03-05: 8 mg via INTRAVENOUS

## 2011-03-05 NOTE — Patient Instructions (Signed)
TAKE COUMADIN 5 MG 1-4 AND 1-5 THEN 5 MG ON M-W-F AND 2.5 MG THE OTHER DAYS. REPEAT PT/INR ON 03-12-11

## 2011-03-12 ENCOUNTER — Ambulatory Visit: Payer: Self-pay

## 2011-03-12 ENCOUNTER — Other Ambulatory Visit (HOSPITAL_BASED_OUTPATIENT_CLINIC_OR_DEPARTMENT_OTHER): Payer: 59 | Admitting: Lab

## 2011-03-12 ENCOUNTER — Ambulatory Visit (HOSPITAL_BASED_OUTPATIENT_CLINIC_OR_DEPARTMENT_OTHER): Payer: 59

## 2011-03-12 VITALS — BP 123/76 | HR 108 | Temp 99.2°F

## 2011-03-12 DIAGNOSIS — C539 Malignant neoplasm of cervix uteri, unspecified: Secondary | ICD-10-CM

## 2011-03-12 DIAGNOSIS — Z5181 Encounter for therapeutic drug level monitoring: Secondary | ICD-10-CM

## 2011-03-12 DIAGNOSIS — Z7901 Long term (current) use of anticoagulants: Secondary | ICD-10-CM

## 2011-03-12 DIAGNOSIS — Z5189 Encounter for other specified aftercare: Secondary | ICD-10-CM

## 2011-03-12 DIAGNOSIS — I82409 Acute embolism and thrombosis of unspecified deep veins of unspecified lower extremity: Secondary | ICD-10-CM

## 2011-03-12 LAB — PROTIME-INR: INR: 2 (ref 2.00–3.50)

## 2011-03-12 MED ORDER — PEGFILGRASTIM INJECTION 6 MG/0.6ML
6.0000 mg | Freq: Once | SUBCUTANEOUS | Status: AC
  Administered 2011-03-12: 6 mg via SUBCUTANEOUS
  Filled 2011-03-12: qty 0.6

## 2011-03-12 NOTE — Patient Instructions (Signed)
CONTINUE COUMADIN 5 MG ON M-W-F.  TAKE 2.5 MG THE OTHER DAYS. PROTIME TO BE REPEATED AT NEXT VISIT 03-19-11.

## 2011-03-19 ENCOUNTER — Other Ambulatory Visit: Payer: 59 | Admitting: Lab

## 2011-03-19 ENCOUNTER — Ambulatory Visit (HOSPITAL_BASED_OUTPATIENT_CLINIC_OR_DEPARTMENT_OTHER): Payer: 59 | Admitting: Oncology

## 2011-03-19 ENCOUNTER — Ambulatory Visit: Payer: 59 | Admitting: Oncology

## 2011-03-19 ENCOUNTER — Telehealth: Payer: Self-pay | Admitting: Oncology

## 2011-03-19 VITALS — BP 125/78 | HR 96 | Temp 97.0°F | Ht 61.5 in | Wt 230.7 lb

## 2011-03-19 DIAGNOSIS — N269 Renal sclerosis, unspecified: Secondary | ICD-10-CM

## 2011-03-19 DIAGNOSIS — C539 Malignant neoplasm of cervix uteri, unspecified: Secondary | ICD-10-CM

## 2011-03-19 DIAGNOSIS — I82409 Acute embolism and thrombosis of unspecified deep veins of unspecified lower extremity: Secondary | ICD-10-CM

## 2011-03-19 DIAGNOSIS — I890 Lymphedema, not elsewhere classified: Secondary | ICD-10-CM

## 2011-03-19 DIAGNOSIS — I4891 Unspecified atrial fibrillation: Secondary | ICD-10-CM

## 2011-03-19 LAB — CBC WITH DIFFERENTIAL/PLATELET
Basophils Absolute: 0.1 10*3/uL (ref 0.0–0.1)
EOS%: 0.9 % (ref 0.0–7.0)
HGB: 10.8 g/dL — ABNORMAL LOW (ref 11.6–15.9)
MCH: 28.9 pg (ref 25.1–34.0)
MONO#: 2.1 10*3/uL — ABNORMAL HIGH (ref 0.1–0.9)
NEUT#: 16.8 10*3/uL — ABNORMAL HIGH (ref 1.5–6.5)
RDW: 17.5 % — ABNORMAL HIGH (ref 11.2–14.5)
WBC: 21 10*3/uL — ABNORMAL HIGH (ref 3.9–10.3)
lymph#: 1.8 10*3/uL (ref 0.9–3.3)

## 2011-03-19 LAB — COMPREHENSIVE METABOLIC PANEL
ALT: 26 U/L (ref 0–35)
AST: 23 U/L (ref 0–37)
Chloride: 102 mEq/L (ref 96–112)
Creatinine, Ser: 1.5 mg/dL — ABNORMAL HIGH (ref 0.50–1.10)
Sodium: 140 mEq/L (ref 135–145)
Total Bilirubin: 0.3 mg/dL (ref 0.3–1.2)

## 2011-03-19 LAB — PROTIME-INR: INR: 2.9 (ref 2.00–3.50)

## 2011-03-19 NOTE — Telephone Encounter (Signed)
lmonvm for April @ lymphedema clinic requesting appt for pt. Faxed referral/demographics. April will call pt - pt aware.

## 2011-03-19 NOTE — Patient Instructions (Signed)
[facility] will be in touch with you to schedule follow up. Let us know if you do not hear from them by the time you come to see Dr.ClarkePearson on Jan 25. For now, take coumadin 2.5 mg today, then 2.5 mg Tues/Wed/Thurs/Sat/Sun and 5 mg Mon and Fri beginning 03-22-11  Dr.Macyn Remmert will schedule follow up appointment after we know results of scan and hear from Dr.ClarkePearson.

## 2011-03-20 NOTE — Progress Notes (Signed)
OFFICE PROGRESS NOTE Date of Visit 03-19-2011 Physisicans:D.ClarkePearson, V.Leschber, J.Allred, J.Kinard, D.Woodruff  INTERVAL HISTORY:   Patient is seen, alone for visit today, in follow up of recent chemotherapy for metastatic squamous cell carcinoma of cervix and anticoagulation for LLE DVT. She has been back on treatment since 10-28-10, with 1 cycle of taxol + carboplatin, then additional 5 cycles of single agent taxol (due to renal concerns) thru Mar 05, 2011. She had partial response by PET after 3rd cycle and is for restaging PET/CT on Jan 21 then follow up with Dr.ClarkePearson on Jan 25. She did require gCSF with this chemotherapy (neulasta). History is of IB squamous cell carcinoma of cervix in March 2005, treated with radical hysterectomy by Dr.John Kyla Balzarine. She developed left pelvic sidewall recurrence Feb.2010 treated with radiation given with sensitizing CDDP, then progression in pelvic nodes treated with additional RT + sensitizing CDDP in early 2012.  Most recent progression involved pelvic nodes + dome of liver. Situation is complicated by atrophic left kidney, where she previously had hydronephrosis, and LLE DVT for which she continues coumadin.She did not have significant right hydronephrosis at last urologic evaluation. Patient's 6th cycle of chemotherapy (taxol) was given on Mar 05, 2011 with neulasta on Jan 11. She has still preferred peripheral venous access to PAC. She is fatigued but has continued to work. She uses prn antiemetics, does have significant taxol and neulasta aches, has had some environmental allergy symptoms but no fever or other infectious illness. She has had no bleeding. LLE swelling is still uncomfortable and we will try to get her to lymphedema PT now. Review of Systems otherwise: no chest pain or different cardiac symptoms (afib), occasional slight right flank discomfort, no increased SOB or cough. Bowels ok. Remainder of full 10 point ROS  negative.  Objective:  Vital signs in last 24 hours:  BP 125/78  Pulse 96  Temp(Src) 97 F (36.1 C) (Oral)  Ht 5' 1.5" (1.562 m)  Wt 230 lb 11.2 oz (104.645 kg)  BMI 42.88 kg/m2  Ambulatory without assistance. Alopecia  HEENT:mucous membranes moist, pharynx normal without lesions.PERRL, not icteric. LymphaticsCervical, supraclavicular, and axillary nodes normal.No inguinal adenopathy Resp: clear to auscultation bilaterally and normal percussion bilaterally Cardio: irregularly irregular rhythm GI: soft, non-tender; bowel sounds normal; no masses,  no organomegaly. Obese. Extremities: 2+ swelling LLE below knee, 1+ in thigh. No swelling, cords, tenderness RLE Neuro:no sensory deficits noted Skin without rash or ecchymoses   Lab Results:   Basename 03/19/11 1206  WBC 21.0*  HGB 10.8*  HCT 33.1*  PLT 160  post neulasta  BMET  Basename 03/19/11 1206  NA 140  K 3.2*  CL 102  CO2 26  GLUCOSE 108*  BUN 10  CREATININE 1.50*  CALCIUM 9.2   INR 2.9 on coumadin2.5 mg TTSS and 5 mg MWF. Studies/Results:  No results found. PET/CT pending 03-22-11 Medications: I have reviewed the patient's current medications. She will decrease coumadin to 2.5 mg also today, then 2.5 mg daily except 5 mg Mon and Fri.  Assessment/Plan:  1. Metastatic squamous cell ca cervix: post treatment as above including chemotherapy thru 03-05-11. For restaging as above. I will set up next appointment at this office as appropriate after restaging information is available. 2.LLE DVT: [facility] coumadin clinic to follow 3.lymphedema LLE: referral to lymphedema PT 4. Atrial fibrillation, previously also anticoagulated for this indication, but this had been stopped prior to developing the DVT 5. Atrophic left kidney         Lasheka Kempner  P, MD   03/20/2011, 12:09 PM

## 2011-03-22 ENCOUNTER — Encounter (HOSPITAL_COMMUNITY)
Admission: RE | Admit: 2011-03-22 | Discharge: 2011-03-22 | Disposition: A | Discharge status: Home / Self Care | Payer: 59 | Source: Ambulatory Visit | Attending: Oncology | Admitting: Oncology

## 2011-03-22 ENCOUNTER — Encounter (HOSPITAL_COMMUNITY): Payer: Self-pay

## 2011-03-22 DIAGNOSIS — C539 Malignant neoplasm of cervix uteri, unspecified: Secondary | ICD-10-CM

## 2011-03-22 MED ORDER — FLUDEOXYGLUCOSE F - 18 (FDG) INJECTION
16.5000 | Freq: Once | INTRAVENOUS | Status: AC | PRN
  Administered 2011-03-22: 16.5 via INTRAVENOUS

## 2011-03-23 ENCOUNTER — Telehealth: Payer: Self-pay

## 2011-03-23 ENCOUNTER — Other Ambulatory Visit: Payer: Self-pay

## 2011-03-23 DIAGNOSIS — E876 Hypokalemia: Secondary | ICD-10-CM

## 2011-03-23 MED ORDER — POTASSIUM CHLORIDE ER 10 MEQ PO TBCR: 10.0000 meq | EXTENDED_RELEASE_TABLET | Freq: Every day | ORAL | Status: DC

## 2011-03-23 NOTE — Telephone Encounter (Signed)
SPOKE WITH MS. Gurka AND TOLD HER THAT HER KCL WAS LOW AT 3.2 ON 03-19-11.  REVIEWED FOODS RICH IN KCL EG; OJ AND BANANAS. SENT PRESCRIPTION TO [facility] FOR KCL 10 MEQ DAILY X 3.

## 2011-03-25 ENCOUNTER — Encounter: Payer: Self-pay | Admitting: Gynecologic Oncology

## 2011-03-26 ENCOUNTER — Ambulatory Visit: Payer: 59 | Attending: Gynecology | Admitting: Gynecology

## 2011-03-26 ENCOUNTER — Encounter: Payer: Self-pay | Admitting: Pharmacist

## 2011-03-26 ENCOUNTER — Encounter: Payer: Self-pay | Admitting: Gynecology

## 2011-03-26 VITALS — BP 132/76 | HR 68 | Temp 97.7°F | Resp 16 | Ht 61.5 in | Wt 233.6 lb

## 2011-03-26 DIAGNOSIS — I890 Lymphedema, not elsewhere classified: Secondary | ICD-10-CM | POA: Insufficient documentation

## 2011-03-26 DIAGNOSIS — Z79899 Other long term (current) drug therapy: Secondary | ICD-10-CM | POA: Insufficient documentation

## 2011-03-26 DIAGNOSIS — G609 Hereditary and idiopathic neuropathy, unspecified: Secondary | ICD-10-CM | POA: Insufficient documentation

## 2011-03-26 DIAGNOSIS — Z86718 Personal history of other venous thrombosis and embolism: Secondary | ICD-10-CM | POA: Insufficient documentation

## 2011-03-26 DIAGNOSIS — K7689 Other specified diseases of liver: Secondary | ICD-10-CM | POA: Insufficient documentation

## 2011-03-26 DIAGNOSIS — I10 Essential (primary) hypertension: Secondary | ICD-10-CM | POA: Insufficient documentation

## 2011-03-26 DIAGNOSIS — Z90710 Acquired absence of both cervix and uterus: Secondary | ICD-10-CM | POA: Insufficient documentation

## 2011-03-26 DIAGNOSIS — Z9079 Acquired absence of other genital organ(s): Secondary | ICD-10-CM | POA: Insufficient documentation

## 2011-03-26 DIAGNOSIS — C539 Malignant neoplasm of cervix uteri, unspecified: Secondary | ICD-10-CM

## 2011-03-26 DIAGNOSIS — C787 Secondary malignant neoplasm of liver and intrahepatic bile duct: Secondary | ICD-10-CM | POA: Insufficient documentation

## 2011-03-26 DIAGNOSIS — Z87891 Personal history of nicotine dependence: Secondary | ICD-10-CM | POA: Insufficient documentation

## 2011-03-26 NOTE — Progress Notes (Signed)
Pt referred to Coumadin clinic on 03/22/11.  Question then of whether the pt could possibly change from Coumadin to Xarelto. Attempts made to contact pt & have her call me back w/ answer of whether she wants to do this. In the meantime, I spoke w/ Dagmar Hait & after pt was seen by Dr. Loree Fee today, there is a possibilty that pt may be having radiofrequency ablation of liver lesion soon. I spoke w/ Dr. Darrold Span w/ this information & for now we will hold off seeing pt in our Coumadin clinic until we receive further word from Dr. Darrold Span. I have filed pts coumadin clinic referral form in the front of our Coumadin notebook for future reference, if necessary. If we are consulted, we would also need to un-archive pt from Dose Response. Marily Lente, Pharm.D.

## 2011-03-26 NOTE — Progress Notes (Signed)
Consult Note: Gyn-Onc   [name] 61 y.o. female  Chief Complaint  Patient presents with  . Cervical Cancer    Follow up    Interval History: The patient returns today for reevaluation. 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 A CT scan on January 21 shows slight progression of her isolated liver metastasis. The lesion now measures 1.9 cm where previously it measured 1.6 cm. There is no other significant evidence of metastatic disease at the present time. 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 Is also noted she has a recent history of a deep vein thrombosis which is probably contributing to her edema. She has no other GI or GU symptoms.  HPI: She underwent a radical hysterectomy bilateral salpingo-oophorectomy and pelvic lymphadenectomy in March of 2005 for a stage IB cervical cancer. Final pathology showed no residual disease and all lymph nodes were negative.  The patient was followed for 5 years with no evidence of recurrent disease until she developed a recurrence of the right pelvic sidewall in February of 2010. This caused obstruction of the right ureter. She was treated with radiation therapy and concurrent cisplatin chemotherapy.  In late 2011 the patient was found to have recurrence of the right inguinal lymph node and left pelvic nodes and received additional radiation therapy to those areas. This radiation was completed in January 2012. In April of 2012 the patient had resolution of the right inguinal node. In August 2012 the patient was found to have a liver lesion assuring 2.8 cm and received 6 cycles of Taxol as noted above.  Allergies  Allergen Reactions  . Codeine     REACTION:  Breathing issues and rash 40 years ago  . Flecainide Acetate   . Morphine     REACTION: Hallucinations  . Propafenone Hcl     LEG CRAMPS    Past Medical History  Diagnosis Date  . Diastolic dysfunction   . MIGRAINE HEADACHE   . Atrial fibrillation   . Anemia   . HTN (hypertension)   . GLAUCOMA   . Depression   . CARPAL TUNNEL SYNDROME, RIGHT   . Lymphedema     L>R leg from pelvic XRT/scarring  . Cervical cancer dx 2005, recur 04/2008    s/p chemo/XRT; recurrent dz L pelvis dx 2010  . Hepatic steatosis     CT scan 06/2009, 12/2009  . DVT (deep venous thrombosis) 10/2010    LLE    Past Surgical History  Procedure Date  . Total abdominal hysterectomy   . Cholecystectomy   . Ureteral stent placement 2005, 01/2010, 07/2010    L hydro related to cervical ca and XRT  . Oophorectomy   . Cardioversion   . Tonsillectomy     Current Outpatient Prescriptions  Medication Sig Dispense Refill  . dexamethasone (DECADRON) 4 MG tablet Take 1 tablet (4 mg total) by mouth See admin instructions. Take 5 tabs with food 12 hrs and 6 hrs. Prior to taxol.   30 tablet  0  . diltiazem (CARDIZEM CD) 120 MG 24 hr capsule Take 1 capsule (120 mg total) by mouth daily.  30 capsule  6  . furosemide (LASIX) 40 MG tablet Take 1 tablet (40 mg total) by mouth daily.  30  tablet  6  . HYDROcodone-acetaminophen (NORCO) 7.5-325 MG per tablet Take 1 by mouth every 4-6 hours as needed      . JANTOVEN 5 MG tablet TAKE 1/2 TAB SUN, TUES, WED, THURS, AND SAT. 1 TAB ON MON. AND Friday OR AS DIRECTED      . LORazepam (ATIVAN) 1 MG tablet Take 1-2 by mouth every 4-6 hours as needed      . metoprolol (LOPRESSOR) 50 MG tablet Take 1 by mouth twice a day      . Multiple Vitamin (MULTIVITAMIN) tablet Take 1 tablet by mouth daily.        [name] omeprazole (PRILOSEC) 20 MG capsule Take 20 mg by mouth daily.        . ondansetron (ZOFRAN) 8 MG tablet Take 1-2 every 12 hours as needed      . potassium chloride (K-DUR) 10 MEQ  tablet Take 1 tablet (10 mEq total) by mouth daily.  3 tablet  0    History   Social History  . Marital Status: Single    Spouse Name: N/A    Number of Children: N/A  . Years of Education: N/A   Occupational History  . Not on file.   Social History Main Topics  . Smoking status: Former Smoker -- 0.5 packs/day for 3 years    Quit date: 03/01/1984  . Smokeless tobacco: Not on file  . Alcohol Use: No  . Drug Use: No  . Sexually Active: Not on file   Other Topics Concern  . 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 Lung cancer    . Hypertension    . Heart disease    . Stroke    . Asthma    . Asthma Son     Review of Systems: 10 point review of systems is negative except as noted above  Vitals: Blood pressure 132/76, pulse 68, temperature 97.7 F (36.5 C), resp. rate 16, height 5' 1.5" (1.562 m), weight 233 lb 9.6 oz (105.96 kg).  Physical Exam: In general the patient is a pleasant white female no acute distress  HEENT reveals alopecia  Neck is supple without thyromegaly  There is no supraclavicular or inguinal adenopathy although the inguinal regions are thickened from prior radiation therapy.  The abdomen is obese soft non-mass and no masses organomegaly or ascites are noted. Exam EGBUS vagina bladder urethra are normal the cervix and uterus are surgically absent there are no vaginal lesions  Bimanual and rectovaginal exam revealed no masses induration or nodularity.  Assessment/Plan: Recurrent cervix cancer with liver metastasis now progressing after 6 cycles of Taxol chemotherapy. The patient is asymptomatic from her metastatic disease.  I have reviewed the CT scan and would like to consider the possibility of using radiofrequency ablation or cryotherapy to manage the isolated liver metastasis. We will ask  the interventional radiologist to review her scan and determine whether this is feasible.  If RFA is not feasible then I would recommend the patient be treated further with topotecan and a Avastin.   There are no Phase II trials available to consider.   Jeannette Corpus, MD 03/26/2011, 10:25 AM                         Consult Note: Gyn-Onc  [name] 61 y.o. female  Chief Complaint  Patient presents with  . Cervical Cancer    Follow up    Interval History:   HPI:  Allergies  Allergen Reactions  . Codeine     REACTION: Breathing issues and rash 40 years ago  . Flecainide Acetate   . Morphine     REACTION: Hallucinations  . Propafenone Hcl     LEG CRAMPS    Past Medical History  Diagnosis Date  . Diastolic dysfunction   . MIGRAINE HEADACHE   . Atrial fibrillation   . Anemia   . HTN (hypertension)   . GLAUCOMA   . Depression   . CARPAL TUNNEL SYNDROME, RIGHT   . Lymphedema     L>R leg from pelvic XRT/scarring  . Cervical cancer dx 2005, recur 04/2008    s/p chemo/XRT; recurrent dz L pelvis dx 2010  . Hepatic steatosis     CT scan 06/2009, 12/2009  . DVT (deep venous thrombosis) 10/2010    LLE    Past Surgical History  Procedure Date  . Total abdominal hysterectomy   . Cholecystectomy   . Ureteral stent placement 2005, 01/2010, 07/2010    L hydro related to cervical ca and XRT  . Oophorectomy   . Cardioversion   . Tonsillectomy     Current Outpatient Prescriptions  Medication Sig Dispense Refill  . dexamethasone (DECADRON) 4 MG tablet Take 1 tablet (4 mg total) by mouth See admin instructions. Take 5 tabs with food 12 hrs and 6 hrs. Prior to taxol.   30 tablet  0  . diltiazem (CARDIZEM CD) 120 MG 24 hr capsule Take 1 capsule (120 mg total) by mouth daily.  30 capsule  6  . furosemide (LASIX) 40 MG tablet Take 1 tablet (40 mg total) by mouth daily.  30 tablet  6  . HYDROcodone-acetaminophen (NORCO) 7.5-325 MG per tablet  Take 1 by mouth every 4-6 hours as needed      . JANTOVEN 5 MG tablet TAKE 1/2 TAB SUN, TUES, WED, THURS, AND SAT. 1 TAB ON MON. AND Friday OR AS DIRECTED      . LORazepam (ATIVAN) 1 MG tablet Take 1-2 by mouth every 4-6 hours as needed      . metoprolol (LOPRESSOR) 50 MG tablet Take 1 by mouth twice a day      . Multiple Vitamin (MULTIVITAMIN) tablet Take 1 tablet by mouth daily.        [name] omeprazole (PRILOSEC) 20 MG capsule Take 20 mg by mouth daily.        . ondansetron (ZOFRAN) 8 MG tablet Take 1-2 every 12 hours as needed      . potassium chloride (K-DUR) 10 MEQ tablet Take 1 tablet (10 mEq total) by mouth daily.  3 tablet  0    History   Social History  . Marital Status: Single    Spouse Name: N/A    Number of Children: N/A  . Years of Education: N/A   Occupational History  . Not on file.   Social History Main Topics  . Smoking status: Former Smoker -- 0.5 packs/day for 3 years    Quit date: 03/01/1984  . Smokeless tobacco: Not on file  . Alcohol Use: No  . Drug Use: No  . Sexually Active: Not on file   Other Topics Concern  . 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 Lung cancer    . Hypertension    . Heart disease    . Stroke    . Asthma    . Asthma Son     Review of Systems:  Vitals: Blood pressure 132/76, pulse 68, temperature 97.7 F (36.5 C), resp. rate 16, height 5' 1.5" (1.562 m), weight 233 lb 9.6 oz (105.96 kg).  Physical Exam:  Assessment/Plan:   Jeannette Corpus, MD 03/26/2011, 10:25 AM

## 2011-03-26 NOTE — Patient Instructions (Signed)
We'll consult interventional radiology for consideration of RFA to your liver metastasis

## 2011-03-29 ENCOUNTER — Other Ambulatory Visit: Payer: Self-pay | Admitting: Interventional Radiology

## 2011-03-29 ENCOUNTER — Encounter: Payer: Self-pay | Admitting: Radiology

## 2011-03-29 DIAGNOSIS — C787 Secondary malignant neoplasm of liver and intrahepatic bile duct: Secondary | ICD-10-CM

## 2011-03-31 ENCOUNTER — Ambulatory Visit: Payer: 59 | Attending: Oncology | Admitting: Physical Therapy

## 2011-03-31 DIAGNOSIS — IMO0001 Reserved for inherently not codable concepts without codable children: Secondary | ICD-10-CM | POA: Insufficient documentation

## 2011-03-31 DIAGNOSIS — I890 Lymphedema, not elsewhere classified: Secondary | ICD-10-CM | POA: Insufficient documentation

## 2011-04-05 ENCOUNTER — Other Ambulatory Visit: Payer: Self-pay | Admitting: Gynecology

## 2011-04-05 DIAGNOSIS — C787 Secondary malignant neoplasm of liver and intrahepatic bile duct: Secondary | ICD-10-CM

## 2011-04-06 ENCOUNTER — Other Ambulatory Visit: Payer: 59

## 2011-04-06 ENCOUNTER — Ambulatory Visit
Admission: RE | Admit: 2011-04-06 | Discharge: 2011-04-06 | Disposition: A | Discharge status: Home / Self Care | Payer: 59 | Source: Ambulatory Visit | Attending: Gynecology | Admitting: Gynecology

## 2011-04-06 DIAGNOSIS — C787 Secondary malignant neoplasm of liver and intrahepatic bile duct: Secondary | ICD-10-CM

## 2011-04-06 NOTE — Progress Notes (Signed)
Patient here in consultation for treatment of recent diagnosis of liver mets (cervical cancer diagnosed 2005, recurred 2010).  jkl

## 2011-04-07 ENCOUNTER — Other Ambulatory Visit: Payer: Self-pay | Admitting: Diagnostic Radiology

## 2011-04-08 ENCOUNTER — Encounter: Payer: Self-pay | Admitting: Internal Medicine

## 2011-04-08 ENCOUNTER — Other Ambulatory Visit: Payer: Self-pay | Admitting: Gynecology

## 2011-04-08 DIAGNOSIS — I82402 Acute embolism and thrombosis of unspecified deep veins of left lower extremity: Secondary | ICD-10-CM

## 2011-04-08 NOTE — Telephone Encounter (Signed)
error 

## 2011-04-13 ENCOUNTER — Encounter: Payer: Self-pay | Admitting: Physician Assistant

## 2011-04-13 ENCOUNTER — Ambulatory Visit (INDEPENDENT_AMBULATORY_CARE_PROVIDER_SITE_OTHER): Payer: 59 | Admitting: Physician Assistant

## 2011-04-13 VITALS — BP 120/76 | HR 94 | Ht 61.5 in | Wt 228.0 lb

## 2011-04-13 DIAGNOSIS — C539 Malignant neoplasm of cervix uteri, unspecified: Secondary | ICD-10-CM

## 2011-04-13 DIAGNOSIS — I5032 Chronic diastolic (congestive) heart failure: Secondary | ICD-10-CM

## 2011-04-13 DIAGNOSIS — I10 Essential (primary) hypertension: Secondary | ICD-10-CM

## 2011-04-13 DIAGNOSIS — I4891 Unspecified atrial fibrillation: Secondary | ICD-10-CM

## 2011-04-13 DIAGNOSIS — I82409 Acute embolism and thrombosis of unspecified deep veins of unspecified lower extremity: Secondary | ICD-10-CM

## 2011-04-13 DIAGNOSIS — Z01810 Encounter for preprocedural cardiovascular examination: Secondary | ICD-10-CM

## 2011-04-13 DIAGNOSIS — I509 Heart failure, unspecified: Secondary | ICD-10-CM

## 2011-04-13 DIAGNOSIS — R0602 Shortness of breath: Secondary | ICD-10-CM

## 2011-04-13 NOTE — Patient Instructions (Signed)
Your physician recommends that you schedule a follow-up appointment in: 3 months with Dr Allred   Your physician has requested that you have an echocardiogram. Echocardiography is a painless test that uses sound waves to create images of your heart. It provides your doctor with information about the size and shape of your heart and how well your heart's chambers and valves are working. This procedure takes approximately one hour. There are no restrictions for this procedure.      

## 2011-04-13 NOTE — Assessment & Plan Note (Signed)
With liver mets.  Ablation procedure pending with Dr. Fredia Sorrow.

## 2011-04-13 NOTE — Assessment & Plan Note (Addendum)
In terms of her AFib, she really does not need bridging Lovenox.  She dose not have a h/o stroke.  Technically she has a CHADS2 score of 2 (HTN, diast CHF).  She has been tolerating coumadin for her DVT.  Coumadin is followed at the cancer center.  I spoke to Dr. Fredia Sorrow by phone.  She does require about an hour of general anesthesia.  He is checking on her follow up ultrasound.  If her leg has not recanalized, her ablation procedure will have to be postponed.  From an AFib perspective, she should be able to hold coumadin for 5-7 days prior to her procedure and restart it post op when felt to be safe.  She has had some dyspnea recently, although it has not significantly changed.  There is a remote chance of cardiac complications from her chemotherapeutic agents.  I will go ahead and arrange a follow up echocardiogram.  I told her to take her diltiazem and metoprolol even on the day of her procedure to keep her HR well controlled.  She can follow up with Dr. Hillis Range in 3 mos.

## 2011-04-13 NOTE — Assessment & Plan Note (Signed)
Controlled.  

## 2011-04-13 NOTE — Assessment & Plan Note (Signed)
I reviewed her doppler report.  It sounds like she had a very extensive clot from her lower leg to her thigh.  Follow up is planned as noted above.

## 2011-04-13 NOTE — Assessment & Plan Note (Signed)
Volume stable.  Repeat echo as noted.

## 2011-04-13 NOTE — Progress Notes (Signed)
[address] [place], [place]  [address] Phone: [phone] Fax:  [phone]  Date:  04/13/2011   Name:  [name]       DOB:  [birth year] MRN:  [id number]  PCP:  Dr. Felicity Coyer Primary Cardiologist:  Dr. Hillis Range  Primary Electrophysiologist:  Dr. Hillis Range    History of Present Illness: [name] is a 61 y.o. female who presents for surgical clearance.  She has a history of persistent atrial fibrillation.  She has failed amiodarone in the past.  She was previously on Pradaxa.  However, this was discontinued secondary to hematuria.  She was maintained on aspirin only with a CHADS2 score of one.  She also has a history of diastolic heart failure.  Last echocardiogram 3/12: EF 60-65%, mild LAE.  Other history includes hypertension, headaches and depression.  She also has a history of cervical cancer.  She has been treated with chemotherapy and radiation therapy in the past.  She has recently been diagnosed with recurrent metastatic disease.  Also, she was diagnosed with a DVT 9/12 and placed on Coumadin.  She has seen Dr. Fredia Sorrow for possible percutaneous microwave ablation of her liver lesion.  She is referred back to cardiology for clearance and whether or not she would need bridging with Lovenox in the setting of her history of atrial fibrillation.  She denies chest pain.  She has dyspnea with some activities.  She has not been active for several months with her chronic illness.  She just finished chemotherapy in 03/2011.  She was on Taxol and Carboplatin.  She can achieve 4 METs without anginal symptoms.  No orthopnea, PND.  She has chronic L>>R edema.  No syncope.  She has a repeat LE u/s scheduled for 2/14.    Past Medical History  Diagnosis Date  . Diastolic dysfunction   . MIGRAINE HEADACHE   . Atrial fibrillation   . Anemia   . HTN (hypertension)   . GLAUCOMA   . Depression   . CARPAL TUNNEL SYNDROME, RIGHT   . Lymphedema     L>R leg  from pelvic XRT/scarring  . Cervical cancer dx 2005, recur 04/2008    s/p chemo/XRT; recurrent dz L pelvis dx 2010  . Hepatic steatosis     CT scan 06/2009, 12/2009  . DVT (deep venous thrombosis) 10/2010    LLE    Current Outpatient Prescriptions  Medication Sig Dispense Refill  . diltiazem (CARDIZEM CD) 120 MG 24 hr capsule Take 1 capsule (120 mg total) by mouth daily.  30 capsule  6  . furosemide (LASIX) 40 MG tablet Take 1 tablet (40 mg total) by mouth daily.  30 tablet  6  . HYDROcodone-acetaminophen (NORCO) 7.5-325 MG per tablet Take 1 by mouth every 4-6 hours as needed      . JANTOVEN 5 MG tablet TAKE 1/2 TAB SUN, TUES, WED, THURS, AND SAT. 1 TAB ON MON. AND Friday OR AS DIRECTED      . metoprolol (LOPRESSOR) 50 MG tablet Take 1 by mouth twice a day      . Multiple Vitamin (MULTIVITAMIN) tablet Take 1 tablet by mouth daily.        [name] omeprazole (PRILOSEC) 20 MG capsule Take 20 mg by mouth daily.          Allergies: Allergies  Allergen Reactions  . Codeine     REACTION: Breathing issues and rash 40 years ago  . Flecainide Acetate   .  Morphine     REACTION: Hallucinations  . Propafenone Hcl     LEG CRAMPS    History  Substance Use Topics  . Smoking status: Former Smoker -- 0.5 packs/day for 3 years    Quit date: 03/01/1984  . Smokeless tobacco: Not on file  . Alcohol Use: No     ROS:  Please see the history of present illness.   All other systems reviewed and negative.   PHYSICAL EXAM: VS:  BP 120/76  Pulse 94  Ht 5' 1.5" (1.562 m)  Wt 228 lb (103.42 kg)  BMI 42.38 kg/m2 Well nourished, well developed, in no acute distress HEENT: normal Neck: no JVD Endo: no thyromegaly Cardiac:  normal S1, S2; irreg irreg; no murmur Lungs:  clear to auscultation bilaterally, no wheezing, rhonchi or rales Abd: soft, nontender, no hepatomegaly Ext: 1-2+ RLE edema Skin: warm and dry Neuro:  CNs 2-12 intact, no focal abnormalities noted  EKG:  AFib, HR 94, normal axis, no  acute changes  ASSESSMENT AND PLAN:

## 2011-04-15 ENCOUNTER — Ambulatory Visit
Admission: RE | Admit: 2011-04-15 | Discharge: 2011-04-15 | Disposition: A | Discharge status: Home / Self Care | Payer: 59 | Source: Ambulatory Visit | Attending: Gynecology | Admitting: Gynecology

## 2011-04-15 DIAGNOSIS — I82402 Acute embolism and thrombosis of unspecified deep veins of left lower extremity: Secondary | ICD-10-CM

## 2011-04-16 ENCOUNTER — Telehealth: Payer: Self-pay | Admitting: Internal Medicine

## 2011-04-16 NOTE — Telephone Encounter (Signed)
Spoke with [name] in Radiology, patient is scheduled to have a Microwave Liver ablation scheduled on 05/07/2011 with Dr. Fredia Sorrow. MD would like for pt to be off  Coumadin for the the whole week.

## 2011-04-16 NOTE — Telephone Encounter (Signed)
Unable to speak with Inetta Fermo, I will call her back again.

## 2011-04-16 NOTE — Telephone Encounter (Signed)
New problem:  Patient need to stop couamdin 7 days prior to surgery. Surgery on 3/8.

## 2011-04-19 ENCOUNTER — Other Ambulatory Visit (HOSPITAL_COMMUNITY): Payer: Self-pay | Admitting: Interventional Radiology

## 2011-04-19 DIAGNOSIS — K769 Liver disease, unspecified: Secondary | ICD-10-CM

## 2011-04-19 NOTE — Telephone Encounter (Signed)
Proceed without further cardiac workup prior to the procedure. She should be low risk from a cardiac standpoint for a low risk procedure.

## 2011-04-19 NOTE — Telephone Encounter (Signed)
Faxed to [facility] at [phone]

## 2011-04-26 ENCOUNTER — Ambulatory Visit (HOSPITAL_COMMUNITY): Payer: 59 | Attending: Cardiology

## 2011-04-26 ENCOUNTER — Encounter: Payer: Self-pay | Admitting: Internal Medicine

## 2011-04-26 ENCOUNTER — Ambulatory Visit (INDEPENDENT_AMBULATORY_CARE_PROVIDER_SITE_OTHER): Payer: 59 | Admitting: Internal Medicine

## 2011-04-26 ENCOUNTER — Other Ambulatory Visit: Payer: Self-pay

## 2011-04-26 DIAGNOSIS — I4891 Unspecified atrial fibrillation: Secondary | ICD-10-CM

## 2011-04-26 DIAGNOSIS — R0602 Shortness of breath: Secondary | ICD-10-CM

## 2011-04-26 DIAGNOSIS — I82409 Acute embolism and thrombosis of unspecified deep veins of unspecified lower extremity: Secondary | ICD-10-CM

## 2011-04-26 DIAGNOSIS — I079 Rheumatic tricuspid valve disease, unspecified: Secondary | ICD-10-CM | POA: Insufficient documentation

## 2011-04-26 DIAGNOSIS — I059 Rheumatic mitral valve disease, unspecified: Secondary | ICD-10-CM | POA: Insufficient documentation

## 2011-04-26 DIAGNOSIS — I10 Essential (primary) hypertension: Secondary | ICD-10-CM

## 2011-04-26 DIAGNOSIS — C539 Malignant neoplasm of cervix uteri, unspecified: Secondary | ICD-10-CM

## 2011-04-26 DIAGNOSIS — E785 Hyperlipidemia, unspecified: Secondary | ICD-10-CM | POA: Insufficient documentation

## 2011-04-26 DIAGNOSIS — I509 Heart failure, unspecified: Secondary | ICD-10-CM | POA: Insufficient documentation

## 2011-04-26 DIAGNOSIS — Z01810 Encounter for preprocedural cardiovascular examination: Secondary | ICD-10-CM

## 2011-04-26 NOTE — Assessment & Plan Note (Signed)
Dx 10/2010 - hypercoag state related to cervical cancer - back on anticoag, managed by cancer center

## 2011-04-26 NOTE — Assessment & Plan Note (Signed)
DCCV 07/2010 - briefly off Pradaxa in 09/2010 Then back on coumadin due LLE DVT 10/2010

## 2011-04-26 NOTE — Assessment & Plan Note (Signed)
Dx 04/2003: s/p chemo/debulk/XRT Recurrent dx 2/2011L pelvic wall and liver met - resumed chemo 09/2010 Multiple complications related to same and renal involvement/obstruction DVT LLE 10/2010 likely related to hypercoagulable state from malignancy -  Hepatic metastatic lesion RF ablation planned with interventional radiology 04/2011 Interval history reviewed in EMR and with patient today

## 2011-04-26 NOTE — Assessment & Plan Note (Signed)
The current medical regimen is effective;  continue present plan and medications. BP Readings from Last 3 Encounters:  04/26/11 110/72  04/13/11 120/76  04/06/11 159/79

## 2011-04-26 NOTE — Progress Notes (Signed)
Subjective:    Patient ID: [name], female    DOB: March 12, 1950, 61 y.o.   MRN: [id number]  HPI here for follow up - reviewed chronic medical issues/interval hx:  AF with RVR - difficult mgmt of same, s/p DCCV 09/2010 - briefly off anticoag (pradaxa) due to bleeding complications (hematuria) and low CHADs - but back on anticoag due to LLE DVT 10/2010 - expect lifelong anticoag -No chest pain or palpitations  LLE DVT - dx 10/2010 - following with cancer center [facility] for coumadin- no new swelling in chronic LLE lymphedema, chest pain or shortness of breath   Cervical cancer, metastatic - dx 04/2003, recurrent dz 2011 - resumed chemo XRT 09/2010 - complicated by renal insuff - planning RFA 04/2011 for hepatic met - ongoing chemo   Past Medical History  Diagnosis Date  . Diastolic dysfunction   . MIGRAINE HEADACHE   . Atrial fibrillation     failed DCCV, CHADs2-prev pradaxa stopped due to hematuria with ureter issues/JJ   . Anemia   . HTN (hypertension)   . GLAUCOMA   . Depression   . CARPAL TUNNEL SYNDROME, RIGHT   . Lymphedema     L>R leg from pelvic XRT/scarring  . Cervical cancer dx 2005, recur 04/2008    s/p chemo/XRT; recurrent dz L pelvis dx 2010  . Hepatic steatosis     CT scan 06/2009, 12/2009  . DVT (deep venous thrombosis) 10/2010    LLE, anticaog resumed    Review of Systems  Constitutional: Negative for fever, diaphoresis, appetite change and unexpected weight change.  Cardiovascular: Negative for chest pain and palpitations.  Genitourinary: Positive for flank pain (right side x 2 months). Negative for hematuria.  Musculoskeletal: Positive for back pain. Negative for joint swelling and gait problem.       Objective:   Physical Exam  BP 110/72  Pulse 95  Temp(Src) 98.2 F (36.8 C) (Oral)  Wt 233 lb (105.688 kg)  SpO2 97% Wt Readings from Last 3 Encounters:  04/26/11 233 lb (105.688 kg)  04/13/11 228 lb (103.42 kg)  03/26/11 233 lb 9.6 oz (105.96 kg)    Constitutional: She is obese but appears well-developed and well-nourished. No distress. Cardiovascular: Irregular rate and rhythm - normal heart sounds.  No murmur heard. trace chronic BLE edema, L>R. Pulmonary/Chest: Effort normal and breath sounds normal. No respiratory distress. She has no wheezes.  Abd: obese, S, NTND, no flank tenderness Psychiatric: She has a normal but slightly depressed/anxious mood and affect. Her behavior is normal. Judgment and thought content normal.   Lab Results  Component Value Date   WBC 21.0* 03/19/2011   HGB 10.8* 03/19/2011   HCT 33.1* 03/19/2011   PLT 160 03/19/2011   ALT 26 03/19/2011   AST 23 03/19/2011   NA 140 03/19/2011   K 3.2* 03/19/2011   CL 102 03/19/2011   CREATININE 1.50* 03/19/2011   BUN 10 03/19/2011   CO2 26 03/19/2011   TSH 1.700 05/16/2010   INR 2.90 03/19/2011   HGBA1C 5.2 09/19/2008   US Venous Img Lower Unilateral Left  04/15/2011  *RADIOLOGY REPORT*  Clinical Data:   swelling, history of DVT, on Coumadin  LEFT LOWER EXTREMITY VENOUS DOPPLER ULTRASOUND  Technique: Gray-scale sonography with compression, as well as color and duplex ultrasound, were performed to evaluate the deep venous system from the level of the common femoral vein through the popliteal and proximal calf veins.  Comparison: None available  Findings:  Normal compressibility of  the common femoral, superficial femoral, and popliteal veins, as well as the proximal calf veins.  No filling defects to suggest DVT on grayscale or color Doppler imaging.  Doppler waveforms show normal direction of venous flow, normal respiratory phasicity and response to augmentation.  IMPRESSION: No evidence of  lower extremity deep vein thrombosis.  Original Report Authenticated By: Osa Craver, M.D.       Assessment & Plan:  See problem list. Medications and labs reviewed today.  Time spent with pt today 25 minutes, greater than 50% time spent counseling patient on DVT, anticoag,  cancer treatment and depression + medication review. Also review of interval hx/records

## 2011-04-26 NOTE — Patient Instructions (Addendum)
It was good to see you today. We have reviewed your prior records including labs and tests today Medications reviewed, no changes at this time. Please schedule followup in 6 months for review, call sooner if problems.  

## 2011-04-29 ENCOUNTER — Encounter (HOSPITAL_COMMUNITY): Payer: Self-pay | Admitting: Pharmacy Technician

## 2011-04-30 ENCOUNTER — Other Ambulatory Visit: Payer: Self-pay | Admitting: Radiology

## 2011-04-30 HISTORY — PX: OTHER SURGICAL HISTORY: SHX169

## 2011-05-03 ENCOUNTER — Encounter (HOSPITAL_COMMUNITY)
Admission: RE | Admit: 2011-05-03 | Discharge: 2011-05-03 | Disposition: A | Discharge status: Home / Self Care | Payer: 59 | Source: Ambulatory Visit | Attending: Interventional Radiology | Admitting: Interventional Radiology

## 2011-05-03 ENCOUNTER — Other Ambulatory Visit: Payer: Self-pay | Admitting: Physician Assistant

## 2011-05-03 ENCOUNTER — Encounter (HOSPITAL_COMMUNITY): Payer: Self-pay

## 2011-05-03 HISTORY — DX: Gastro-esophageal reflux disease without esophagitis: K21.9

## 2011-05-03 HISTORY — DX: Unspecified osteoarthritis, unspecified site: M19.90

## 2011-05-03 HISTORY — DX: Heart failure, unspecified: I50.9

## 2011-05-03 HISTORY — DX: Small kidney, unspecified: N27.9

## 2011-05-03 LAB — CBC
HCT: 35 % — ABNORMAL LOW (ref 36.0–46.0)
MCH: 28.3 pg (ref 26.0–34.0)
MCHC: 33.1 g/dL (ref 30.0–36.0)
MCV: 85.4 fL (ref 78.0–100.0)
Platelets: 214 10*3/uL (ref 150–400)
RDW: 15.9 % — ABNORMAL HIGH (ref 11.5–15.5)
WBC: 3.9 10*3/uL — ABNORMAL LOW (ref 4.0–10.5)

## 2011-05-03 LAB — PROTIME-INR
INR: 1.2 (ref 0.00–1.49)
Prothrombin Time: 15.5 seconds — ABNORMAL HIGH (ref 11.6–15.2)

## 2011-05-03 LAB — COMPREHENSIVE METABOLIC PANEL
AST: 20 U/L (ref 0–37)
Albumin: 3.9 g/dL (ref 3.5–5.2)
BUN: 20 mg/dL (ref 6–23)
Calcium: 10.1 mg/dL (ref 8.4–10.5)
Chloride: 102 mEq/L (ref 96–112)
Creatinine, Ser: 1.2 mg/dL — ABNORMAL HIGH (ref 0.50–1.10)
Total Bilirubin: 0.3 mg/dL (ref 0.3–1.2)
Total Protein: 7.7 g/dL (ref 6.0–8.3)

## 2011-05-03 NOTE — Anesthesia Preprocedure Evaluation (Addendum)
Anesthesia Evaluation  Patient identified by MRN, date of birth, ID band Patient awake    Reviewed: Allergy & Precautions, H&P , NPO status , Patient's Chart, lab work & pertinent test results, reviewed documented beta blocker date and time   Airway Mallampati: II TM Distance: >3 FB Neck ROM: Full    Dental  (+) Teeth Intact and Caps,    Pulmonary shortness of breath and with exertion,  breath sounds clear to auscultation  Pulmonary exam normal       Cardiovascular hypertension, Pt. on medications and Pt. on home beta blockers +CHF - Peripheral Vascular Disease + dysrhythmias (Chronic atrial fibrillation) Atrial Fibrillation Rhythm:Irregular Rate:Abnormal     Neuro/Psych  Headaches, PSYCHIATRIC DISORDERS Depression  Neuromuscular disease    GI/Hepatic GERD-  Medicated,History of cervical cancer with mets to liver   Endo/Other  negative endocrine ROS  Renal/GU negative Renal ROS   History L ureteral obstruction with stenting in past    Musculoskeletal   Abdominal (+) + obese,   Peds  Hematology negative hematology ROS (+)   Anesthesia Other Findings   Reproductive/Obstetrics                           Anesthesia Physical Anesthesia Plan  ASA: III  Anesthesia Plan: General   Post-op Pain Management:    Induction: Intravenous  Airway Management Planned: Oral ETT  Additional Equipment:   Intra-op Plan:   Post-operative Plan: Extubation in OR  Informed Consent: I have reviewed the patients History and Physical, chart, labs and discussed the procedure including the risks, benefits and alternatives for the proposed anesthesia with the patient or authorized representative who has indicated his/her understanding and acceptance.   Dental advisory given  Plan Discussed with: CRNA  Anesthesia Plan Comments:         Anesthesia Quick Evaluation

## 2011-05-03 NOTE — Patient Instructions (Signed)
20 [name]  05/03/2011   Your procedure is scheduled on:  05/07/11  Report to SHORT STAY DEPT  at 6:00 AM.  Call this number if you have problems the morning of surgery: [phone]   Remember:   Do not eat food or drink liquids AFTER MIDNIGHT    Take these medicines the morning of surgery with A SIP OF WATER: diltiazem / metoprolol / prilosec / hydrocodone if needed   Do not wear jewelry, make-up or nail polish.  Do not wear lotions, powders, or perfumes.   Do not shave legs or underarms 48 hrs. before surgery (men may shave face)  Do not bring valuables to the hospital.  Contacts, dentures or bridgework may not be worn into surgery.  Leave suitcase in the car. After surgery it may be brought to your room.  For patients admitted to the hospital, checkout time is 11:00 AM the day of discharge.   Patients discharged the day of surgery will not be allowed to drive home.  Name and phone number of your driver:   Special Instructions:   Please read over the following fact sheets that you were given: MRSA  Information               SHOWER WITH BETASEPT THE NIGHT BEFORE SURGERY AND THE MORNING OF SURGERY

## 2011-05-07 ENCOUNTER — Encounter (HOSPITAL_COMMUNITY): Payer: Self-pay

## 2011-05-07 ENCOUNTER — Ambulatory Visit (HOSPITAL_COMMUNITY): Payer: 59 | Admitting: Anesthesiology

## 2011-05-07 ENCOUNTER — Encounter (HOSPITAL_COMMUNITY): Payer: Self-pay | Admitting: Anesthesiology

## 2011-05-07 ENCOUNTER — Observation Stay (HOSPITAL_COMMUNITY)
Admission: RE | Admit: 2011-05-07 | Discharge: 2011-05-08 | Disposition: A | Discharge status: Home / Self Care | Payer: 59 | Source: Ambulatory Visit | Attending: Interventional Radiology | Admitting: Interventional Radiology

## 2011-05-07 ENCOUNTER — Ambulatory Visit (HOSPITAL_COMMUNITY)
Admission: RE | Admit: 2011-05-07 | Discharge: 2011-05-07 | Disposition: A | Discharge status: Home / Self Care | Payer: 59 | Source: Ambulatory Visit | Attending: Interventional Radiology | Admitting: Interventional Radiology

## 2011-05-07 ENCOUNTER — Encounter (HOSPITAL_COMMUNITY)
Admission: RE | Disposition: A | Discharge status: Home / Self Care | Payer: Self-pay | Source: Ambulatory Visit | Attending: Interventional Radiology

## 2011-05-07 DIAGNOSIS — F329 Major depressive disorder, single episode, unspecified: Secondary | ICD-10-CM

## 2011-05-07 DIAGNOSIS — N261 Atrophy of kidney (terminal): Secondary | ICD-10-CM

## 2011-05-07 DIAGNOSIS — Z79899 Other long term (current) drug therapy: Secondary | ICD-10-CM | POA: Insufficient documentation

## 2011-05-07 DIAGNOSIS — I10 Essential (primary) hypertension: Secondary | ICD-10-CM

## 2011-05-07 DIAGNOSIS — K7689 Other specified diseases of liver: Secondary | ICD-10-CM | POA: Insufficient documentation

## 2011-05-07 DIAGNOSIS — K769 Liver disease, unspecified: Secondary | ICD-10-CM

## 2011-05-07 DIAGNOSIS — I509 Heart failure, unspecified: Secondary | ICD-10-CM | POA: Insufficient documentation

## 2011-05-07 DIAGNOSIS — Z01812 Encounter for preprocedural laboratory examination: Secondary | ICD-10-CM | POA: Insufficient documentation

## 2011-05-07 DIAGNOSIS — K219 Gastro-esophageal reflux disease without esophagitis: Secondary | ICD-10-CM | POA: Insufficient documentation

## 2011-05-07 DIAGNOSIS — D649 Anemia, unspecified: Secondary | ICD-10-CM

## 2011-05-07 DIAGNOSIS — C539 Malignant neoplasm of cervix uteri, unspecified: Principal | ICD-10-CM

## 2011-05-07 DIAGNOSIS — C787 Secondary malignant neoplasm of liver and intrahepatic bile duct: Secondary | ICD-10-CM | POA: Insufficient documentation

## 2011-05-07 DIAGNOSIS — F3289 Other specified depressive episodes: Secondary | ICD-10-CM

## 2011-05-07 DIAGNOSIS — I4891 Unspecified atrial fibrillation: Secondary | ICD-10-CM

## 2011-05-07 DIAGNOSIS — I5032 Chronic diastolic (congestive) heart failure: Secondary | ICD-10-CM

## 2011-05-07 DIAGNOSIS — Z86718 Personal history of other venous thrombosis and embolism: Secondary | ICD-10-CM | POA: Insufficient documentation

## 2011-05-07 DIAGNOSIS — H409 Unspecified glaucoma: Secondary | ICD-10-CM

## 2011-05-07 DIAGNOSIS — I951 Orthostatic hypotension: Secondary | ICD-10-CM

## 2011-05-07 DIAGNOSIS — I82409 Acute embolism and thrombosis of unspecified deep veins of unspecified lower extremity: Secondary | ICD-10-CM

## 2011-05-07 DIAGNOSIS — G5600 Carpal tunnel syndrome, unspecified upper limb: Secondary | ICD-10-CM

## 2011-05-07 DIAGNOSIS — G43909 Migraine, unspecified, not intractable, without status migrainosus: Secondary | ICD-10-CM

## 2011-05-07 LAB — TYPE AND SCREEN: Antibody Screen: NEGATIVE

## 2011-05-07 LAB — PROTIME-INR: Prothrombin Time: 13.2 seconds (ref 11.6–15.2)

## 2011-05-07 SURGERY — RADIO FREQUENCY ABLATION
Anesthesia: General | Wound class: Clean

## 2011-05-07 MED ORDER — SODIUM CHLORIDE 0.9 % IV SOLN
INTRAVENOUS | Status: DC
  Administered 2011-05-07: 15:00:00 via INTRAVENOUS

## 2011-05-07 MED ORDER — HYDROMORPHONE HCL PF 1 MG/ML IJ SOLN
INTRAMUSCULAR | Status: DC | PRN
  Administered 2011-05-07: 4 mg via INTRAVENOUS

## 2011-05-07 MED ORDER — HYDROCODONE-ACETAMINOPHEN 5-325 MG PO TABS
1.0000 | ORAL_TABLET | ORAL | Status: DC | PRN
  Administered 2011-05-07: 2 via ORAL
  Filled 2011-05-07: qty 2

## 2011-05-07 MED ORDER — FENTANYL CITRATE 0.05 MG/ML IJ SOLN
INTRAMUSCULAR | Status: AC
  Filled 2011-05-07: qty 2

## 2011-05-07 MED ORDER — PHENYLEPHRINE HCL 10 MG/ML IJ SOLN
INTRAMUSCULAR | Status: DC | PRN
  Administered 2011-05-07: 80 ug via INTRAVENOUS
  Administered 2011-05-07 (×4): 40 ug via INTRAVENOUS
  Administered 2011-05-07: 80 ug via INTRAVENOUS

## 2011-05-07 MED ORDER — LACTATED RINGERS IV SOLN
Freq: Once | INTRAVENOUS | Status: AC
  Administered 2011-05-07 (×2): via INTRAVENOUS

## 2011-05-07 MED ORDER — ONDANSETRON HCL 4 MG/2ML IJ SOLN
INTRAMUSCULAR | Status: DC | PRN
  Administered 2011-05-07: 4 mg via INTRAVENOUS

## 2011-05-07 MED ORDER — ONDANSETRON HCL 4 MG/2ML IJ SOLN: 4.0000 mg | Freq: Four times a day (QID) | INTRAMUSCULAR | Status: DC | PRN

## 2011-05-07 MED ORDER — LIDOCAINE HCL (CARDIAC) 20 MG/ML IV SOLN
INTRAVENOUS | Status: DC | PRN
  Administered 2011-05-07: 100 mg via INTRAVENOUS

## 2011-05-07 MED ORDER — PHENYLEPHRINE HCL 10 MG/ML IJ SOLN
10.0000 mg | INTRAVENOUS | Status: DC | PRN
  Administered 2011-05-07: 40 ug/min via INTRAVENOUS

## 2011-05-07 MED ORDER — HYDROMORPHONE HCL PF 1 MG/ML IJ SOLN
1.0000 mg | INTRAMUSCULAR | Status: DC | PRN
  Administered 2011-05-07: 1 mg via INTRAVENOUS
  Filled 2011-05-07: qty 1

## 2011-05-07 MED ORDER — PROMETHAZINE HCL 25 MG/ML IJ SOLN: 6.2500 mg | INTRAMUSCULAR | Status: DC | PRN

## 2011-05-07 MED ORDER — ROCURONIUM BROMIDE 100 MG/10ML IV SOLN
INTRAVENOUS | Status: DC | PRN
  Administered 2011-05-07: 50 mg via INTRAVENOUS
  Administered 2011-05-07: 20 mg via INTRAVENOUS

## 2011-05-07 MED ORDER — SENNOSIDES-DOCUSATE SODIUM 8.6-50 MG PO TABS
1.0000 | ORAL_TABLET | Freq: Every day | ORAL | Status: DC | PRN
  Filled 2011-05-07: qty 1

## 2011-05-07 MED ORDER — LACTATED RINGERS IV SOLN: INTRAVENOUS | Status: DC

## 2011-05-07 MED ORDER — MIDAZOLAM HCL 5 MG/5ML IJ SOLN
INTRAMUSCULAR | Status: DC | PRN
  Administered 2011-05-07: 1 mg via INTRAVENOUS

## 2011-05-07 MED ORDER — DOCUSATE SODIUM 100 MG PO CAPS
100.0000 mg | ORAL_CAPSULE | Freq: Two times a day (BID) | ORAL | Status: DC
  Administered 2011-05-07: 100 mg via ORAL
  Filled 2011-05-07 (×3): qty 1

## 2011-05-07 MED ORDER — FENTANYL CITRATE 0.05 MG/ML IJ SOLN
25.0000 ug | INTRAMUSCULAR | Status: DC | PRN
  Administered 2011-05-07 (×2): 50 ug via INTRAVENOUS

## 2011-05-07 MED ORDER — CEFAZOLIN SODIUM-DEXTROSE 2-3 GM-% IV SOLR
INTRAVENOUS | Status: AC
  Filled 2011-05-07: qty 50

## 2011-05-07 MED ORDER — PROPOFOL 10 MG/ML IV BOLUS
INTRAVENOUS | Status: DC | PRN
  Administered 2011-05-07: 120 mg via INTRAVENOUS

## 2011-05-07 MED ORDER — GLYCOPYRROLATE 0.2 MG/ML IJ SOLN
INTRAMUSCULAR | Status: DC | PRN
  Administered 2011-05-07: .6 mg via INTRAVENOUS

## 2011-05-07 MED ORDER — FENTANYL CITRATE 0.05 MG/ML IJ SOLN
INTRAMUSCULAR | Status: DC | PRN
  Administered 2011-05-07 (×5): 50 ug via INTRAVENOUS

## 2011-05-07 MED ORDER — CEFAZOLIN SODIUM-DEXTROSE 2-3 GM-% IV SOLR
2.0000 g | INTRAVENOUS | Status: AC
  Administered 2011-05-07: 2 g via INTRAVENOUS

## 2011-05-07 NOTE — Anesthesia Postprocedure Evaluation (Signed)
Anesthesia Post Note  Patient: [name]  Procedure(s) Performed: Procedure(s) (LRB): RADIO FREQUENCY ABLATION (N/A)  Anesthesia type: General  Patient location: PACU  Post pain: Pain level controlled  Post assessment: Post-op Vital signs reviewed  Last Vitals:  Filed Vitals:   05/07/11 1132  BP:   Pulse:   Temp: 36.6 C  Resp:     Post vital signs: Reviewed  Level of consciousness: sedated  Complications: No apparent anesthesia complications

## 2011-05-07 NOTE — H&P (Signed)
[name] is an 61 y.o. female.   Chief Complaint: "I'm here for treatment of my liver" HPI: Patient with history of metastatic squamous cell carcinoma of cervix and hypermetabolic solitary liver lesion near dome measuring 1.9 cm. She presents today for CT guided percutaneous microwave ablation of the liver lesion.  Past Medical History  Diagnosis Date  . Diastolic dysfunction   . MIGRAINE HEADACHE   . Atrial fibrillation     failed DCCV, CHADs2-prev pradaxa stopped due to hematuria with ureter issues/JJ   . Anemia   . HTN (hypertension)   . GLAUCOMA   . CARPAL TUNNEL SYNDROME, RIGHT   . Lymphedema     L>R leg from pelvic XRT/scarring  . Hepatic steatosis     CT scan 06/2009, 12/2009  . DVT (deep venous thrombosis) 10/2010    LLE, anticaog resumed  . CHF (congestive heart failure)     1 yr ago per pt  . Dyspnea on exertion   . Arthritis   . GERD (gastroesophageal reflux disease)   . Lesion of liver   . Small kidney     left  . Difficulty sleeping   . Cervical cancer dx 2005, recur 04/2008    s/p chemo/XRT; recurrent dz L pelvis dx 2010    Past Surgical History  Procedure Date  . Total abdominal hysterectomy   . Cholecystectomy   . Ureteral stent placement 2005, 01/2010, 07/2010    L hydro related to cervical ca and XRT  . Oophorectomy   . Cardioversion   . Tonsillectomy     Family History  Problem Relation Age of Onset  . Lung cancer    . Hypertension    . Heart disease    . Stroke    . Asthma    . Asthma Son    Social History:  reports that she quit smoking about 27 years ago. She does not have any smokeless tobacco history on file. She reports that she does not drink alcohol or use illicit drugs.  Allergies:  Allergies  Allergen Reactions  . Codeine     REACTION: Breathing issues and rash 40 years ago  . Flecainide Acetate   . Morphine     REACTION: Hallucinations  . Propafenone Hcl     LEG CRAMPS    Medications Prior to Admission  Medication  Dose Route Frequency Provider Last Rate Last Dose  . ceFAZolin (ANCEF) IVPB 2 g/50 mL premix  2 g Intravenous 60 min Pre-Op Reola Calkins, MD      . lactated ringers infusion   Intravenous Once Robet Leu, PA 20 mL/hr at 05/07/11 0630     Medications Prior to Admission  Medication Sig Dispense Refill  . diltiazem (CARDIZEM CD) 120 MG 24 hr capsule Take 120 mg by mouth daily before breakfast.      . furosemide (LASIX) 40 MG tablet Take 40 mg by mouth daily before breakfast.      . HYDROcodone-acetaminophen (NORCO) 7.5-325 MG per tablet Take 1 tablet by mouth every 6 (six) hours as needed. Pain       . metoprolol (LOPRESSOR) 50 MG tablet Take 50 mg by mouth 2 (two) times daily.       . Multiple Vitamin (MULTIVITAMIN) tablet Take 1 tablet by mouth daily.        [name] omeprazole (PRILOSEC) 20 MG capsule Take 20 mg by mouth daily.       [name] JANTOVEN 5 MG tablet Take 2.5-5 mg by mouth  daily. TAKE 1/2 TAB SUN, TUES, WED, THURS, AND SAT. 1 TAB ON MON. AND Friday OR AS DIRECTED        Results for orders placed during the hospital encounter of 05/07/11 (from the past 48 hour(s))  PROTIME-INR     Status: Normal   Collection Time   05/07/11  6:30 AM      Component Value Range Comment   Prothrombin Time 13.2  11.6 - 15.2 (seconds)    INR 0.98  0.00 - 1.49    TYPE AND SCREEN     Status: Normal   Collection Time   05/07/11  6:30 AM      Component Value Range Comment   ABO/RH(D) A POS      Antibody Screen NEG      Sample Expiration 05/10/2011      Results for orders placed during the hospital encounter of 05/07/11  PROTIME-INR      Component Value Range   Prothrombin Time 13.2  11.6 - 15.2 (seconds)   INR 0.98  0.00 - 1.49   TYPE AND SCREEN      Component Value Range   ABO/RH(D) A POS     Antibody Screen NEG     Sample Expiration 05/10/2011     05/03/2011   Wbc  3.9   hgb  11.6  plt  214k  k 4.1   Cr  1.20   LFT's nl  Review of Systems  Constitutional: Positive for malaise/fatigue. Negative  for fever and chills.  HENT:       Some ringing in ears and loss of taste  Respiratory: Negative for cough and shortness of breath.   Cardiovascular: Positive for leg swelling. Negative for chest pain.  Gastrointestinal: Negative for nausea, vomiting, abdominal pain and blood in stool.  Genitourinary: Negative for hematuria.       Some urinary retention  Musculoskeletal: Positive for back pain and joint pain.  Neurological: Negative for headaches.  Endo/Heme/Allergies: Does not bruise/bleed easily.    Blood pressure 118/85, pulse 77, temperature 97 F (36.1 C), temperature source Oral, resp. rate 18, SpO2 95.00%. Physical Exam  Constitutional: She is oriented to person, place, and time. She appears well-developed and well-nourished.  Cardiovascular:       Irreg. irregular  Respiratory: Effort normal and breath sounds normal.  GI: Soft. Bowel sounds are normal.       obese  Musculoskeletal: Normal range of motion. She exhibits edema.       2+ LLE edema (hx DVT)  Neurological: She is alert and oriented to person, place, and time.     Assessment/Plan Patient with history of metastatic cervical carcinoma and solitary hypermetabolic lesion in dome of liver; plan is for CT guided percutaneous microwave ablation of liver lesion via general anesthesia followed by overnight observation for hemodynamic monitoring.  Dawanna Grauberger,D KEVIN 05/07/2011, 8:07 AM

## 2011-05-07 NOTE — Progress Notes (Signed)
Day of Surgery  Subjective: Mild discomfort, back pain.  Some nausea earlier.  Urine clear.  Objective: Vital signs in last 24 hours: Temp:  [97 F (36.1 C)-97.9 F (36.6 C)] 97.5 F (36.4 C) (03/08 1312) Pulse Rate:  [48-77] 74  (03/08 1312) Resp:  [14-20] 20  (03/08 1312) BP: (107-124)/(65-85) 124/84 mmHg (03/08 1312) SpO2:  [95 %-100 %] 98 % (03/08 1312) Weight:  [221 lb (100.245 kg)] 221 lb (100.245 kg) (03/08 1425) Last BM Date: 05/05/11  Intake/Output from previous day:   Intake/Output this shift: Total I/O In: 1550 [I.V.:1550] Out: 300 [Urine:300]  Abdomen:  Soft, NT, ND.   Lab Results:  No results found for this basename: WBC:2,HGB:2,HCT:2,PLT:2 in the last 72 hours BMET No results found for this basename: NA:2,K:2,CL:2,CO2:2,GLUCOSE:2,BUN:2,CREATININE:2,CALCIUM:2 in the last 72 hours PT/INR  Basename 05/07/11 0630  LABPROT 13.2  INR 0.98   ABG No results found for this basename: PHART:2,PCO2:2,PO2:2,HCO3:2 in the last 72 hours  Studies/Results: Ct Guide Tissue Ablation  05/07/2011  *RADIOLOGY REPORT*  Clinical Data:  Metastatic cervical carcinoma to the liver with enlarging solitary metastatic lesion previously demonstrated by CT and PET scan in the superior right lobe near the dome of the liver. The patient presents for percutaneous ablation.  CT-GUIDED PERCUTANEOUS MICROWAVE ABLATION OF LIVER.  Anesthesia:  General  Medications:  2 grams IV Ancef.  As antibiotic prophylaxis, Ancef was ordered pre-procedure and administered intravenously within one hour of incision.  Contrast:  None  Procedure:  The procedure, risks, benefits, and alternatives were explained to the patient.  Questions regarding the procedure were encouraged and answered.  The patient understands and consents to the procedure.  The patient was placed under general anesthesia.  Initial unenhanced CT was performed in a supine position to localize the right lobe liver lesion.  The right upper abdominal  wall was prepped with Betadine in a sterile fashion, and a sterile drape was applied covering the operative field.  A sterile gown and sterile gloves were used for the procedure.  Under CT guidance, two separate Covidien Evident microwave ablation probes with distal ablation zone of 3.7 cm were advanced into the tumor.  Probe positioning was confirmed by CT prior to ablation.  Microwave ablation was performed through both probes simultaneously with a 10-minute cycle at 45 Watts power.  Tract cauterization was performed through each probe after ablation was completed as the probes were retracted and removed.  Post-procedural CT was performed.  Complications: None  Findings:  The lesion in the superior right lobe near the dome of the liver was well visualized by unenhanced CT.  IV contrast was not administered for localization.  Based on CT appearance, the lesion does appear to have grown since prior imaging in January with maximal diameter of approximately 2.8 cm.  Due to size of the lesion, decision was made to place two separate probes in the superior and inferior aspects of the tumor to maximize lesion treatment.  There were no immediate complications after ablation. Initial recovery will be performed in PACU.  The patient will be observed overnight.  IMPRESSION:  CT guided percutaneous microwave ablation of right lobe liver metastasis.  The patient will be observed overnight.  Initial follow-up will be performed in approximately 4 weeks.  Original Report Authenticated By: Reola Calkins, M.D.    Anti-infectives: Anti-infectives     Start     Dose/Rate Route Frequency Ordered Stop   05/07/11 0603   ceFAZolin (ANCEF) IVPB 2 g/50 mL premix  2 g 100 mL/hr over 30 Minutes Intravenous 60 min pre-op 05/07/11 0603 05/07/11 0948          Assessment/Plan: Status post microwave ablation of liver.  Plan D/C in AM after labs.   LOS: 0 days    [name] T 05/07/2011

## 2011-05-07 NOTE — Procedures (Signed)
Procedure:  CT guided microwave ablation of liver metastasis Anesthesia:  General Findings:  Liver met isolated in right lobe near dome.  Microwave ablation performed with 2 separate Evident MWA probes in lesion.  10 minute cycle at 45 Watts.  Tracts cauterized. Plan:  PACU recovery followed by overnight observation.  Check labs in AM.

## 2011-05-07 NOTE — H&P (Signed)
Agree.  For microwave ablation of right liver metastasis with overnight observation.  Patient seen and examined this morning.  Labs ok to proceed with procedure.

## 2011-05-07 NOTE — Transfer of Care (Signed)
Immediate Anesthesia Transfer of Care Note  Patient: [name]  Procedure(s) Performed: Procedure(s) (LRB): RADIO FREQUENCY ABLATION (N/A)  Patient Location: PACU  Anesthesia Type: General  Level of Consciousness: sedated, patient cooperative and responds to stimulaton  Airway & Oxygen Therapy: Patient Spontanous Breathing and Patient connected to face mask oxgen  Post-op Assessment: Report given to PACU RN and Post -op Vital signs reviewed and stable  Post vital signs: Reviewed and stable  Complications: No apparent anesthesia complications

## 2011-05-08 LAB — CBC
HCT: 29.7 % — ABNORMAL LOW (ref 36.0–46.0)
Hemoglobin: 9.8 g/dL — ABNORMAL LOW (ref 12.0–15.0)
MCHC: 33 g/dL (ref 30.0–36.0)
RBC: 3.41 MIL/uL — ABNORMAL LOW (ref 3.87–5.11)
WBC: 4.3 10*3/uL (ref 4.0–10.5)

## 2011-05-08 LAB — COMPREHENSIVE METABOLIC PANEL
ALT: 101 U/L — ABNORMAL HIGH (ref 0–35)
Alkaline Phosphatase: 106 U/L (ref 39–117)
Chloride: 103 mEq/L (ref 96–112)
GFR calc Af Amer: 67 mL/min — ABNORMAL LOW (ref >90)
Glucose, Bld: 102 mg/dL — ABNORMAL HIGH (ref 70–99)
Potassium: 3.6 mEq/L (ref 3.5–5.1)
Sodium: 138 mEq/L (ref 135–145)
Total Bilirubin: 0.3 mg/dL (ref 0.3–1.2)
Total Protein: 6.4 g/dL (ref 6.0–8.3)

## 2011-05-08 NOTE — Discharge Summary (Signed)
Physician Discharge Summary  Patient ID: [name] MRN: [id number] DOB/AGE: November 23, 1950 61 y.o.  Admit date: 05/07/2011 Discharge date: 05/08/2011  Admission Diagnoses: metastatic cervical ca; liver lesion  Discharge Diagnoses: Liver lesion microwave ablation Active Problems:  * No active hospital problems. *    Discharged Condition: stable; improved  Hospital Course: Metastatic cervical Ca; liver lesion. Microwave liver lesion ablation performed 3/8 in Interventional Radiology. Pt tolerated well; no complication.  Overnight observation post procedure. Slept okay; eating, drinking well. No N/V. No pain. Continues to have some achy back pain which has been bothersome for 3-4 mos. Has ambulated in room; urinating fine- yellow and clear. Afeb; VSS. Have discussed with Dr Fredia Sorrow and we will discharge pt to home today. Pt aware and agreeable.  Consults: None  Significant Diagnostic Studies:  CT guided Microwave ablation of liver lesion  Treatments: Microwave ablation  Discharge Exam: Blood pressure 127/75, pulse 91, temperature 98.8 F (37.1 C), temperature source Oral, resp. rate 16, height 5\' 1"  (1.549 m), weight 221 lb (100.245 kg), SpO2 94.00%.  PE:  VSS; Afeb; Neuro intact; A&O Heart: RRR w/o M Lungs: CTA Abd: soft; +BS; NT; No masses Site of ablation: NT; no bleeding; no hematoma; no sign of infection Extr: FROM Increase in LFTs- expected  Results for orders placed during the hospital encounter of 05/07/11  PROTIME-INR      Component Value Range   Prothrombin Time 13.2  11.6 - 15.2 (seconds)   INR 0.98  0.00 - 1.49   TYPE AND SCREEN      Component Value Range   ABO/RH(D) A POS     Antibody Screen NEG     Sample Expiration 05/10/2011    CBC      Component Value Range   WBC 4.3  4.0 - 10.5 (K/uL)   RBC 3.41 (*) 3.87 - 5.11 (MIL/uL)   Hemoglobin 9.8 (*) 12.0 - 15.0 (g/dL)   HCT 14.7 (*) 82.9 - 46.0 (%)   MCV 87.1  78.0 - 100.0 (fL)   MCH 28.7  26.0 - 34.0 (pg)   MCHC 33.0  30.0 - 36.0 (g/dL)   RDW 56.2 (*) 13.0 - 15.5 (%)   Platelets 171  150 - 400 (K/uL)  COMPREHENSIVE METABOLIC PANEL      Component Value Range   Sodium 138  135 - 145 (mEq/L)   Potassium 3.6  3.5 - 5.1 (mEq/L)   Chloride 103  96 - 112 (mEq/L)   CO2 25  19 - 32 (mEq/L)   Glucose, Bld 102 (*) 70 - 99 (mg/dL)   BUN 14  6 - 23 (mg/dL)   Creatinine, Ser 8.65  0.50 - 1.10 (mg/dL)   Calcium 9.3  8.4 - 78.4 (mg/dL)   Total Protein 6.4  6.0 - 8.3 (g/dL)   Albumin 3.2 (*) 3.5 - 5.2 (g/dL)   AST 696 (*) 0 - 37 (U/L)   ALT 101 (*) 0 - 35 (U/L)   Alkaline Phosphatase 106  39 - 117 (U/L)   Total Bilirubin 0.3  0.3 - 1.2 (mg/dL)   GFR calc non Af Amer 58 (*) >90 (mL/min)   GFR calc Af Amer 67 (*) >90 (mL/min)     Disposition: Metastatic Cervical Ca; Liver lesion - Microwave ablation of liver lesion 3/8 Pt stable and ready for dc to home Dr Fredia Sorrow aware of pt status Continue all home meds Resume coumadin 3/10 Rx: Vicodin 5/500 mg #20 Pt to see Dr Fredia Sorrow in f/u visit in 4  weeks .... Clinic scheduler will call pt with time and date Pt has good understanding of dc instructions    Discharge Orders    Future Appointments: Provider: Department: Dept Phone: Center:   07/12/2011 3:15 PM Hillis Range, MD [facility] [phone] [facility]   10/25/2011 2:30 PM Newt Lukes, MD [facility] [phone] [facility]     Future Orders Please Complete By Expires   Diet - low sodium heart healthy      Increase activity slowly      Discharge instructions      Comments:   Restful x 3-5 days; f/u visit with Dr Fredia Sorrow in 4 weeks- pt will hear from clinic scheduler; call [phone] with issues or questions; cont home meds; resume coumadin 3/10   Lifting restrictions      Comments:   No lifting over 10 lbs x 3 days   Remove dressing in 24 hours      Call MD for:  temperature >100.4      Call MD for:  persistant nausea and vomiting      Call MD for:  severe uncontrolled pain       Call MD for:  redness, tenderness, or signs of infection (pain, swelling, redness, odor or green/yellow discharge around incision site)        Medication List  As of 05/08/2011  8:19 AM   TAKE these medications         diltiazem 120 MG 24 hr capsule   Commonly known as: CARDIZEM CD   Take 120 mg by mouth daily before breakfast.      furosemide 40 MG tablet   Commonly known as: LASIX   Take 40 mg by mouth daily before breakfast.      HYDROcodone-acetaminophen 7.5-325 MG per tablet   Commonly known as: NORCO   Take 1 tablet by mouth every 6 (six) hours as needed. Pain        ibuprofen 200 MG tablet   Commonly known as: ADVIL,MOTRIN   Take 400-600 mg by mouth every 6 (six) hours as needed. Pain      JANTOVEN 5 MG tablet   Generic drug: warfarin   Take 2.5-5 mg by mouth daily. TAKE 1/2 TAB SUN, TUES, WED, THURS, AND SAT. 1 TAB ON MON. AND Friday OR AS DIRECTED      metoprolol 50 MG tablet   Commonly known as: LOPRESSOR   Take 50 mg by mouth 2 (two) times daily.      multivitamin tablet   Take 1 tablet by mouth daily.      omeprazole 20 MG capsule   Commonly known as: PRILOSEC   Take 20 mg by mouth daily.      sodium chloride 0.65 % nasal spray   Commonly known as: OCEAN   Place 1 spray into the nose as needed. Allergies             Signed: Skie Vitrano A 05/08/2011, 8:19 AM

## 2011-05-08 NOTE — Discharge Summary (Signed)
Agree.  Patient stable and ok for discharge.  Follow up in clinic in 4 weeks.

## 2011-05-10 ENCOUNTER — Other Ambulatory Visit: Payer: Self-pay | Admitting: Interventional Radiology

## 2011-05-10 ENCOUNTER — Other Ambulatory Visit (HOSPITAL_COMMUNITY): Payer: Self-pay | Admitting: Interventional Radiology

## 2011-05-10 DIAGNOSIS — C787 Secondary malignant neoplasm of liver and intrahepatic bile duct: Secondary | ICD-10-CM

## 2011-05-13 ENCOUNTER — Ambulatory Visit: Payer: 59 | Admitting: Internal Medicine

## 2011-05-13 ENCOUNTER — Telehealth: Payer: Self-pay | Admitting: *Deleted

## 2011-05-13 NOTE — Telephone Encounter (Signed)
Pt states she was d/c form hosp on 05/07/11 had ablation procedure done, but once she got home sat afternoon had to go to [facility] due to left leg swelling. Pt states they did do u/s & was told that she had some clotting. She was started on levenox injections. Was told to keep leg elevated and was sent home. Pt states she has been following instructions. But today notice new cluster vein, leg is still swollen. Advise pt that she should go back to ER to get evaluated. Pt decline to go she states don't feel like its that bad. Denies CP or SOB. Want to move appt up til tomorrow for her hosp f/u. Pt was schedule for tomorrow @ 1:30pm did advise pt if leg starting having increase swelling, pain, & if she get any CP or SOB advise her to go back to ER ASAP. Pt did agreed if she start having those sxs.Marland KitchenMarland Kitchen3/14/13@4 :41pm/LMB.

## 2011-05-14 ENCOUNTER — Ambulatory Visit (INDEPENDENT_AMBULATORY_CARE_PROVIDER_SITE_OTHER): Payer: 59 | Admitting: Internal Medicine

## 2011-05-14 ENCOUNTER — Encounter: Payer: Self-pay | Admitting: Internal Medicine

## 2011-05-14 VITALS — BP 112/62 | HR 102 | Temp 98.3°F | Ht 62.0 in | Wt 230.0 lb

## 2011-05-14 DIAGNOSIS — C539 Malignant neoplasm of cervix uteri, unspecified: Secondary | ICD-10-CM

## 2011-05-14 DIAGNOSIS — I82409 Acute embolism and thrombosis of unspecified deep veins of unspecified lower extremity: Secondary | ICD-10-CM

## 2011-05-14 MED ORDER — CEPHALEXIN 500 MG PO CAPS: 500.0000 mg | ORAL_CAPSULE | Freq: Three times a day (TID) | ORAL | Status: AC

## 2011-05-14 MED ORDER — FUROSEMIDE 40 MG PO TABS: 40.0000 mg | ORAL_TABLET | ORAL | Status: DC | PRN

## 2011-05-14 MED ORDER — HYDROCODONE-ACETAMINOPHEN 7.5-325 MG PO TABS: 1.0000 | ORAL_TABLET | Freq: Four times a day (QID) | ORAL | Status: DC | PRN

## 2011-05-14 NOTE — Patient Instructions (Signed)
It was good to see you today. 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 Please take as directed and contact our office if you believe you are having problem(s) with the medication(s). We will discuss the situation with interventional radiology about possible intervention to resolve the massive blood clot - you'll be called with information and up-to-date once available

## 2011-05-14 NOTE — Telephone Encounter (Signed)
Noted, thanks!

## 2011-05-14 NOTE — Assessment & Plan Note (Signed)
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

## 2011-05-14 NOTE — Progress Notes (Signed)
Subjective:    Patient ID: [name], female    DOB: 1950-08-03, 61 y.o.   MRN: [id number]  HPI here for follow up - RFA ablation of hepatic lesion 05/07/11 - off anticoag since 2/28 pre procedure  Sudden increase swelling, redness and varicose veins LLE 3/9 PM after dc home Spoke with IR re: same - advised to got to ER, sent [facility] - dx with massive recurrent recurrent DVT, started Lovenox and admitted IVC filter (permenent) placed 3/11 and lovenox continues - dc home 3/11 PM Continued red and swollen leg including thigh - but no fever and no increase in pain or swelling   Also reviewed chronic medical issues/interval hx:  AF with RVR - difficult mgmt of same, s/p DCCV 09/2010 - briefly off anticoag (pradaxa) due to bleeding complications (hematuria) and low CHADs - but back on anticoag due to LLE DVT 10/2010 - expect lifelong anticoag -No chest pain or palpitations  LLE DVT - dx 10/2010 - following with cancer center [facility] for coumadin- complicated by chronic LLE lymphedema, no chest pain or shortness of breath   Cervical cancer, metastatic - dx 04/2003, recurrent dz 2011 - resumed chemo XRT 09/2010 - complicated by renal insuff - s/p RFA 04/2011 for hepatic met - ongoing chemo   Past Medical History  Diagnosis Date  . Diastolic dysfunction   . MIGRAINE HEADACHE   . Atrial fibrillation     failed DCCV, CHADs2-prev pradaxa stopped due to hematuria with ureter issues/JJ   . Anemia   . HTN (hypertension)   . GLAUCOMA   . CARPAL TUNNEL SYNDROME, RIGHT   . Lymphedema     L>R leg from pelvic XRT/scarring  . Hepatic steatosis     CT scan 06/2009, 12/2009  . DVT (deep venous thrombosis) 10/2010    LLE, anticaog resumed  . CHF (congestive heart failure)     1 yr ago per pt  . Dyspnea on exertion   . Arthritis   . GERD (gastroesophageal reflux disease)   . Lesion of liver   . Small kidney     left  . Difficulty sleeping   . Cervical cancer dx 2005, recur 04/2008    s/p chemo/XRT;  recurrent dz L pelvis dx 2010    Review of Systems  Constitutional: Negative for fever, chills, diaphoresis, appetite change and unexpected weight change.  Cardiovascular: Positive for leg swelling. Negative for chest pain and palpitations.  Genitourinary: Negative for hematuria.  Musculoskeletal: Negative for joint swelling and gait problem.  Hematological: Negative for adenopathy. Does not bruise/bleed easily.       Objective:   Physical Exam  BP 112/62  Pulse 102  Temp(Src) 98.3 F (36.8 C) (Oral)  Ht 5\' 2"  (1.575 m)  Wt 230 lb (104.327 kg)  BMI 42.07 kg/m2  SpO2 97% Wt Readings from Last 3 Encounters:  05/14/11 230 lb (104.327 kg)  05/07/11 221 lb (100.245 kg)  05/07/11 221 lb (100.245 kg)   Constitutional: She is obese but appears well-developed and well-nourished. No distress. Cardiovascular: Irregular rate and rhythm - normal heart sounds.  No murmur heard. massive tight LLE woody edema, from foot to upper thigh with varicose veins - no RLE edema (fatty ankle) Pulmonary/Chest: Effort normal and breath sounds normal. No respiratory distress. She has no wheezes.  Abd: obese, S, NTND, L flank tenderness Skin - mild diffuse redness to LLE but no abn warm or tender Psychiatric: She has a normal but approp anxious mood and affect. Her  behavior is normal. Judgment and thought content normal.   Lab Results  Component Value Date   WBC 4.3 05/08/2011   HGB 9.8* 05/08/2011   HCT 29.7* 05/08/2011   PLT 171 05/08/2011   ALT 101* 05/08/2011   AST 116* 05/08/2011   NA 138 05/08/2011   K 3.6 05/08/2011   CL 103 05/08/2011   CREATININE 1.04 05/08/2011   BUN 14 05/08/2011   CO2 25 05/08/2011   TSH 1.700 05/16/2010   INR 0.98 05/07/2011   HGBA1C 5.2 09/19/2008        Assessment & Plan:  See problem list. Medications and labs reviewed today.  Time spent with pt today 25 minutes, greater than 50% time spent counseling patient on recurrent LLE DVT, anticoag, cancer treatment   Empiric Keflex to use  if new symptoms of infectious phlebitis/cellulitis, but favor supportive care only at this time - hydrocodone when necessary pain and Lasix as needed

## 2011-05-14 NOTE — Assessment & Plan Note (Signed)
Dx 04/2003: s/p chemo/debulk/XRT Recurrent dx 2/2011L pelvic wall and liver met - resumed chemo 09/2010 Multiple complications related to same and renal involvement/obstruction - continued L flank discomfort DVT LLE 10/2010 likely related to hypercoagulable state from malignancy -  Hepatic metastatic lesion RF ablation done 05/07/11 interventional radiology  Interval history reviewed in EMR and with patient today

## 2011-05-17 ENCOUNTER — Ambulatory Visit: Payer: 59 | Admitting: Internal Medicine

## 2011-05-18 ENCOUNTER — Telehealth: Payer: Self-pay

## 2011-05-18 NOTE — Telephone Encounter (Signed)
SPOKE WITH MS. [name] AND TOLD HER THAT DR. Darrold Span REVIEWED ALL THE NOTES OF HER CURRENT ABLATION, BLOOD CLOT, AND DR. LESCHBER'S NOTE FROM [id number].  DR. Darrold Span IS VERY SORRY THAT SHE IS HAVING THESE PROBLEMS.  EVERYTHING IN THE WORK UP AND TREATMENT LOOKS CORRECT. THE NOTES FROMM CURRENT EVENTS WILL BE FORWARD TO [facility] FOR APPT. WITH DR. CLARK-PEARSON ON 06-04-11.  PT. APPRECIATED THE PHONE CALL.

## 2011-05-25 DIAGNOSIS — Z0279 Encounter for issue of other medical certificate: Secondary | ICD-10-CM

## 2011-06-04 ENCOUNTER — Ambulatory Visit: Payer: 59 | Attending: Gynecology | Admitting: Gynecology

## 2011-06-04 ENCOUNTER — Encounter: Payer: Self-pay | Admitting: Gynecology

## 2011-06-04 VITALS — BP 132/80 | HR 72 | Temp 98.1°F | Resp 18 | Ht 61.5 in | Wt 227.7 lb

## 2011-06-04 DIAGNOSIS — Z9079 Acquired absence of other genital organ(s): Secondary | ICD-10-CM | POA: Insufficient documentation

## 2011-06-04 DIAGNOSIS — Z87891 Personal history of nicotine dependence: Secondary | ICD-10-CM | POA: Insufficient documentation

## 2011-06-04 DIAGNOSIS — Z90710 Acquired absence of both cervix and uterus: Secondary | ICD-10-CM | POA: Insufficient documentation

## 2011-06-04 DIAGNOSIS — R609 Edema, unspecified: Secondary | ICD-10-CM | POA: Insufficient documentation

## 2011-06-04 DIAGNOSIS — K219 Gastro-esophageal reflux disease without esophagitis: Secondary | ICD-10-CM | POA: Insufficient documentation

## 2011-06-04 DIAGNOSIS — Z79899 Other long term (current) drug therapy: Secondary | ICD-10-CM | POA: Insufficient documentation

## 2011-06-04 DIAGNOSIS — Z86718 Personal history of other venous thrombosis and embolism: Secondary | ICD-10-CM | POA: Insufficient documentation

## 2011-06-04 DIAGNOSIS — I10 Essential (primary) hypertension: Secondary | ICD-10-CM | POA: Insufficient documentation

## 2011-06-04 DIAGNOSIS — C539 Malignant neoplasm of cervix uteri, unspecified: Secondary | ICD-10-CM | POA: Insufficient documentation

## 2011-06-04 NOTE — Progress Notes (Signed)
Consult Note: Gyn-Onc   [name] 61 y.o. female  Chief Complaint  Patient presents with  . Cervical Cancer    Follow up    Interval History: The patient underwent RFA ablation of the liver lesion performed on 10/07/2011. The liver procedure itself went well L. 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 She continues to have severe edema of the left lower extremity. She denies any pulmonary symptoms and has no other GI or GU symptoms.  HPI:She underwent a radical hysterectomy bilateral salpingo-oophorectomy and pelvic lymphadenectomy in March of 2005 for a stage IB cervical cancer. Final pathology showed no residual disease and all lymph nodes were negative.  The patient was followed for 5 years with no evidence of recurrent disease until she developed a recurrence of the right pelvic sidewall in February of 2010. This caused obstruction of the right ureter. She was treated with radiation therapy and concurrent cisplatin chemotherapy.  In late 2011 the patient was found to have recurrence of the right inguinal lymph node and left pelvic nodes and received additional radiation therapy to those areas. This radiation was completed in January 2012. In April of 2012 the patient had resolution of the right inguinal node. In August 2012 the patient was found to have a liver lesion assuring 2.8 cm and received 6 cycles of Taxol as noted above.  She had a partial response to chemo, but theliver lesion began to increase in size (1.9 cm). Reimaging showed that there were no other lesions except the liver and we elected to ablate the liver lesion using RFA. This was performed on 10/07/2011. The liver procedure itself went well L. the patient developed a recurrent extensive deep vein thrombosis in her left lower extremity and was placed on Lovenox and an IVC filter was placed as  well.   Allergies  Allergen Reactions  . Codeine     REACTION: Breathing issues and rash 40 years ago  . Flecainide Acetate   . Morphine     REACTION: Hallucinations  . Propafenone Hcl     LEG CRAMPS    Past Medical History  Diagnosis Date  . Diastolic dysfunction   . MIGRAINE HEADACHE   . Atrial fibrillation     failed DCCV, CHADs2-prev pradaxa stopped due to hematuria with ureter issues/JJ   . Anemia   . HTN (hypertension)   . GLAUCOMA   . CARPAL TUNNEL SYNDROME, RIGHT   . Lymphedema     L>R leg from pelvic XRT/scarring  . Hepatic steatosis     CT scan 06/2009, 12/2009  . DVT (deep venous thrombosis) 10/2010    LLE, anticaog resumed  . CHF (congestive heart failure)     1 yr ago per pt  . Dyspnea on exertion   . Arthritis   . GERD (gastroesophageal reflux disease)   . Lesion of liver   . Small kidney     left  . Difficulty sleeping   . Cervical cancer dx 2005, recur 04/2008    s/p chemo/XRT; recurrent dz L pelvis dx 2010    Past Surgical History  Procedure Date  . Total abdominal hysterectomy   . Cholecystectomy   . Ureteral stent placement 2005, 01/2010, 07/2010    L hydro related to cervical ca and XRT  . Oophorectomy   . Cardioversion   . Tonsillectomy     Current Outpatient Prescriptions  Medication Sig Dispense Refill  . diltiazem (CARDIZEM CD) 120 MG 24 hr capsule Take 120 mg by mouth daily before breakfast.      . enoxaparin (LOVENOX) 100 MG/ML injection Take 1 injection every 12 hours      . HYDROcodone-acetaminophen (NORCO) 7.5-325 MG per tablet Take 1 tablet by mouth every 6 (six) hours as needed for pain.  40 tablet  0  . ibuprofen (ADVIL,MOTRIN) 200 MG tablet Take 400-600 mg by mouth every 6 (six) hours as needed. Pain      . metoprolol (LOPRESSOR) 50 MG tablet Take 50 mg by mouth 2 (two) times daily.       . Multiple Vitamin (MULTIVITAMIN) tablet Take 1 tablet by mouth daily.        [name] omeprazole (PRILOSEC) 20 MG capsule Take 20 mg by mouth  daily.       . sodium chloride (OCEAN) 0.65 % nasal spray Place 1 spray into the nose as needed. Allergies      . furosemide (LASIX) 40 MG tablet Take 1 tablet (40 mg total) by mouth as needed.  30 tablet  1    History   Social History  . Marital Status: Single    Spouse Name: N/A    Number of Children: N/A  . Years of Education: N/A   Occupational History  . Not on file.   Social History Main Topics  . Smoking status: Former Smoker -- 0.5 packs/day for 3 years    Quit date: 03/01/1984  . Smokeless tobacco: Never Used  . Alcohol Use: No  . Drug Use: No  . Sexually Active: No   Other Topics Concern  . 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 Lung cancer    . Hypertension    . Heart disease    . Stroke    . Asthma    . Asthma Son     Review of Systems: 10 point review of systems is negative except as noted above  Vitals: Blood pressure 132/80, pulse 72, temperature 98.1 F (36.7 C), temperature source Oral, resp. rate 18, height 5' 1.5" (1.562 m), weight 227 lb 11.2 oz (103.284 kg).  Physical Exam: General the patient is a pleasant white female no acute distress  HEENT is negative  Neck is supple without thyromegaly  There is no supraclavicular or inguinal adenopathy.  The abdomen is obese soft and nontender no masses again a megaly ascites or hernias are noted. There are many ecchymoses in the abdominal wall secondary to Lovenox injections.  Pelvic exam EGBUS vagina bladder urethra are normal except for some varicosities on the left vulva. The vagina is somewhat atrophic and slightly stenotic no lesions are noted cervix and uterus are surgically absent  Adnexa are without masses rectovaginal exam confirms radiation fibrosis in the pelvis.  The lower cherries are both edematous although left is much more  severe and there is an increased vascular pattern as well.  Assessment/Plan: Recurrent cervical cancer status post RFA they isolated liver lesion 05/07/2011. The patient is scheduled for followup with interventional radiology in the very near future.  The patient has been on Lovenox for several weeks therefore think it's safe for her to resume physical therapy for her edema of her left lower extremity. A note is written to the physical therapist to  get my permission to resume therapy.  We discussed management of deep vein thrombosis at some length. There is clear evidence of patients with cancer do better receiving Lovenox and then Coumadin and therefore we will continue Lovenox 100 mg twice a day.  The patient returned to see me in 3 months or as needed.   Jeannette Corpus, MD 06/04/2011, 3:23 PM                         Consult Note: Gyn-Onc   [name] 61 y.o. female  Chief Complaint  Patient presents with  . Cervical Cancer    Follow up    Interval History:   HPI:  Allergies  Allergen Reactions  . Codeine     REACTION: Breathing issues and rash 40 years ago  . Flecainide Acetate   . Morphine     REACTION: Hallucinations  . Propafenone Hcl     LEG CRAMPS    Past Medical History  Diagnosis Date  . Diastolic dysfunction   . MIGRAINE HEADACHE   . Atrial fibrillation     failed DCCV, CHADs2-prev pradaxa stopped due to hematuria with ureter issues/JJ   . Anemia   . HTN (hypertension)   . GLAUCOMA   . CARPAL TUNNEL SYNDROME, RIGHT   . Lymphedema     L>R leg from pelvic XRT/scarring  . Hepatic steatosis     CT scan 06/2009, 12/2009  . DVT (deep venous thrombosis) 10/2010    LLE, anticaog resumed  . CHF (congestive heart failure)     1 yr ago per pt  . Dyspnea on exertion   . Arthritis   . GERD (gastroesophageal reflux disease)   . Lesion of liver   . Small kidney     left  . Difficulty sleeping   . Cervical cancer dx 2005,  recur 04/2008    s/p chemo/XRT; recurrent dz L pelvis dx 2010    Past Surgical History  Procedure Date  . Total abdominal hysterectomy   . Cholecystectomy   . Ureteral stent placement 2005, 01/2010, 07/2010    L hydro related to cervical ca and XRT  . Oophorectomy   . Cardioversion   . Tonsillectomy     Current Outpatient Prescriptions  Medication Sig Dispense Refill  . diltiazem (CARDIZEM CD) 120 MG 24 hr capsule Take 120 mg by mouth daily before breakfast.      . enoxaparin (LOVENOX) 100 MG/ML injection Take 1 injection every 12 hours      . HYDROcodone-acetaminophen (NORCO) 7.5-325 MG per tablet Take 1 tablet by mouth every 6 (six) hours as needed for pain.  40 tablet  0  . ibuprofen (ADVIL,MOTRIN) 200 MG tablet Take 400-600 mg by mouth every 6 (six) hours as needed. Pain      . metoprolol (LOPRESSOR) 50 MG tablet Take 50 mg by mouth 2 (two) times daily.       . Multiple Vitamin (MULTIVITAMIN) tablet Take 1 tablet by mouth daily.        [name] omeprazole (PRILOSEC) 20 MG capsule Take 20 mg by mouth daily.       . sodium chloride (OCEAN) 0.65 % nasal spray Place 1 spray into the nose as needed. Allergies      . furosemide (LASIX) 40 MG tablet Take 1 tablet (40 mg total) by mouth as needed.  30 tablet  1    History   Social History  . Marital Status: Single  Spouse Name: N/A    Number of Children: N/A  . Years of Education: N/A   Occupational History  . Not on file.   Social History Main Topics  . Smoking status: Former Smoker -- 0.5 packs/day for 3 years    Quit date: 03/01/1984  . Smokeless tobacco: Never Used  . Alcohol Use: No  . Drug Use: No  . Sexually Active: No   Other Topics Concern  . 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 Lung cancer    . Hypertension    . Heart disease    . Stroke    . Asthma     . Asthma Son     Review of Systems:  Vitals: Blood pressure 132/80, pulse 72, temperature 98.1 F (36.7 C), temperature source Oral, resp. rate 18, height 5' 1.5" (1.562 m), weight 227 lb 11.2 oz (103.284 kg).  Physical Exam:  Assessment/Plan:   Jeannette Corpus, MD 06/04/2011, 3:23 PM

## 2011-06-04 NOTE — Patient Instructions (Signed)
Return interventional radiology as scheduled.  Return to see me in 3 months.

## 2011-06-07 ENCOUNTER — Other Ambulatory Visit: Payer: Self-pay | Admitting: Gynecology

## 2011-06-07 DIAGNOSIS — C787 Secondary malignant neoplasm of liver and intrahepatic bile duct: Secondary | ICD-10-CM

## 2011-06-09 ENCOUNTER — Other Ambulatory Visit: Payer: Self-pay | Admitting: Interventional Radiology

## 2011-06-09 ENCOUNTER — Other Ambulatory Visit: Payer: Self-pay | Admitting: Gynecology

## 2011-06-09 ENCOUNTER — Ambulatory Visit (HOSPITAL_COMMUNITY)
Admission: RE | Admit: 2011-06-09 | Discharge: 2011-06-09 | Disposition: A | Discharge status: Home / Self Care | Payer: 59 | Source: Ambulatory Visit | Attending: Interventional Radiology | Admitting: Interventional Radiology

## 2011-06-09 ENCOUNTER — Ambulatory Visit
Admission: RE | Admit: 2011-06-09 | Discharge: 2011-06-09 | Disposition: A | Discharge status: Home / Self Care | Payer: 59 | Source: Ambulatory Visit | Attending: Interventional Radiology | Admitting: Interventional Radiology

## 2011-06-09 DIAGNOSIS — C787 Secondary malignant neoplasm of liver and intrahepatic bile duct: Secondary | ICD-10-CM

## 2011-06-09 DIAGNOSIS — C539 Malignant neoplasm of cervix uteri, unspecified: Secondary | ICD-10-CM

## 2011-06-09 MED ORDER — IOHEXOL 300 MG/ML  SOLN
100.0000 mL | Freq: Once | INTRAMUSCULAR | Status: AC | PRN
  Administered 2011-06-09: 100 mL via INTRAVENOUS

## 2011-06-21 ENCOUNTER — Ambulatory Visit: Payer: 59 | Attending: Oncology | Admitting: Physical Therapy

## 2011-06-21 DIAGNOSIS — IMO0001 Reserved for inherently not codable concepts without codable children: Secondary | ICD-10-CM | POA: Insufficient documentation

## 2011-06-21 DIAGNOSIS — I890 Lymphedema, not elsewhere classified: Secondary | ICD-10-CM | POA: Insufficient documentation

## 2011-06-23 ENCOUNTER — Ambulatory Visit: Payer: 59

## 2011-06-25 ENCOUNTER — Ambulatory Visit: Payer: 59 | Admitting: Physical Therapy

## 2011-06-28 ENCOUNTER — Ambulatory Visit: Payer: 59 | Admitting: Physical Therapy

## 2011-06-30 ENCOUNTER — Ambulatory Visit: Payer: 59 | Attending: Oncology

## 2011-06-30 DIAGNOSIS — I890 Lymphedema, not elsewhere classified: Secondary | ICD-10-CM | POA: Insufficient documentation

## 2011-06-30 DIAGNOSIS — IMO0001 Reserved for inherently not codable concepts without codable children: Secondary | ICD-10-CM | POA: Insufficient documentation

## 2011-07-02 ENCOUNTER — Ambulatory Visit: Payer: 59 | Admitting: Physical Therapy

## 2011-07-05 ENCOUNTER — Telehealth: Payer: Self-pay | Admitting: Gynecologic Oncology

## 2011-07-05 ENCOUNTER — Ambulatory Visit: Payer: 59 | Admitting: Physical Therapy

## 2011-07-05 NOTE — Telephone Encounter (Signed)
Returning patient's message.  Pt called with concerns about recent CT scan results and the need for a future PET scan.  Stating that her urologist, Dr. Yvonna Alanis, informed her that she had an enlarged lymph node on the left.  Instructed that Dr. Stanford Breed would be notified of her concerns [place] on [place].  No other concerns or questions voiced.

## 2011-07-07 ENCOUNTER — Ambulatory Visit: Payer: 59

## 2011-07-09 ENCOUNTER — Ambulatory Visit (INDEPENDENT_AMBULATORY_CARE_PROVIDER_SITE_OTHER): Payer: 59 | Admitting: Internal Medicine

## 2011-07-09 ENCOUNTER — Encounter: Payer: Self-pay | Admitting: Internal Medicine

## 2011-07-09 ENCOUNTER — Ambulatory Visit: Payer: 59 | Admitting: Physical Therapy

## 2011-07-09 VITALS — BP 112/70 | HR 70 | Temp 98.5°F | Ht 61.5 in | Wt 223.6 lb

## 2011-07-09 DIAGNOSIS — R109 Unspecified abdominal pain: Secondary | ICD-10-CM

## 2011-07-09 DIAGNOSIS — C539 Malignant neoplasm of cervix uteri, unspecified: Secondary | ICD-10-CM

## 2011-07-09 MED ORDER — GABAPENTIN 300 MG PO CAPS: 300.0000 mg | ORAL_CAPSULE | Freq: Every day | ORAL | Status: DC

## 2011-07-09 MED ORDER — HYDROCODONE-ACETAMINOPHEN 10-325 MG PO TABS: 1.0000 | ORAL_TABLET | ORAL | Status: AC | PRN

## 2011-07-09 NOTE — Patient Instructions (Signed)
It was good to see you today. We have reviewed the interval events and CT scan since your last visit here Continue Lovenox as ongoing without change Increase Norco to 10/325 every 4 h as needed and start gabapentin 300mg  at bedtime for pain Your prescription(s) have been submitted to your pharmacy. Please take as directed and contact our office if you believe you are having problem(s) with the medication(s). discuss the situation with Dr Stanford Breed about the left side lymph nodes on CT scan 06/09/2011 and your pain

## 2011-07-09 NOTE — Assessment & Plan Note (Signed)
Dx 04/2003: s/p chemo/debulk/XRT Recurrent dx 2/2011L pelvic wall and liver met - resumed chemo 09/2010 - 03/05/2011 Multiple complications related to same and renal involvement/obstruction -  DVT LLE 10/2010 and 04/2011 likely related to hypercoagulable state from malignancy - s/p IVC filter 04/2011 Hepatic metastatic lesion RF ablation done 05/07/11 by  interventional radiology  continued L flank discomfort - increase LN sixe on 05/2011 CT - reviewed with pt today - to follow up with gyn onc on Mon 07/12/11 Increase hydrocodone and add neurontin 300mg  qhs - advised on Miralax to avoid constipation Interval history reviewed in EMR and with patient today

## 2011-07-09 NOTE — Progress Notes (Signed)
Subjective:    Patient ID: [name], female    DOB: [birth year], 61 y.o.   MRN: [id number]  HPI here for L flank pain - ongoing >2 months Not relieved with max dose ibuprofen or Norco 7.5 Not changed by position or activity No radiation of pain into buttock, leg or anterior abdomen ?related to cancer given increase LN mets on CT 05/2011  Also reviewed chronic medical issues/interval hx:  AF with RVR - difficult mgmt of same, s/p DCCV 09/2010 - briefly off anticoag (pradaxa) due to bleeding complications (hematuria) and low CHADs - but back on anticoag due to LLE DVT 10/2010 - expect lifelong anticoag -No chest pain or palpitations  LLE DVT - dx 10/2010, 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 S/p IVC filter 04/2011 at [facility], remains on LMWH - following with cancer center- complicated by chronic LLE lymphedema, no chest pain or shortness of breath   Cervical cancer, metastatic - dx 04/2003, recurrent dz 2011 - resumed chemo XRT 09/2010 - 03/2011. 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 Diastolic dysfunction   . MIGRAINE HEADACHE   . Atrial fibrillation     failed DCCV, CHADs2-prev pradaxa stopped due to hematuria with ureter issues/JJ   . Anemia   . HTN (hypertension)   . GLAUCOMA   . CARPAL TUNNEL SYNDROME, RIGHT   . Lymphedema     L>R leg from pelvic XRT/scarring  . Hepatic steatosis     CT scan 06/2009, 12/2009  . DVT (deep venous thrombosis) 10/2010    LLE, anticaog resumed  . CHF (congestive heart failure)     1 yr ago per pt  . Dyspnea on exertion   . Arthritis   . GERD (gastroesophageal reflux disease)   . Lesion of liver   . Small kidney     left  . Difficulty sleeping   . Cervical cancer dx 2005, recur 04/2008    s/p chemo/XRT; recurrent dz L pelvis dx 2010    Review of Systems  Constitutional: Negative for fever, chills, diaphoresis and unexpected  weight change.  Cardiovascular: Positive for leg swelling. Negative for chest pain and palpitations.  Genitourinary: Positive for flank pain (left). Negative for dysuria and hematuria.  Musculoskeletal: Negative for joint swelling and gait problem.       Objective:   Physical Exam  BP 112/70  Pulse 70  Temp(Src) 98.5 F (36.9 C) (Temporal)  Ht 5' 1.5" (1.562 m)  Wt 223 lb 9.6 oz (101.424 kg)  BMI 41.56 kg/m2  SpO2 97% Wt Readings from Last 3 Encounters:  07/09/11 223 lb 9.6 oz (101.424 kg)  06/09/11 227 lb (102.967 kg)  06/04/11 227 lb 11.2 oz (103.284 kg)   Constitutional: She is obese but appears well-developed and well-nourished. No distress. Cardiovascular: Irregular rate and rhythm - normal heart sounds.  No murmur heard.  LLE woody edema, from foot to upper thigh with varicose veins - no RLE edema (fatty ankle) Pulmonary/Chest: Effort normal and breath sounds normal. No respiratory distress. She has no wheezes.  Abd: obese, S, NTND, no reproducible L flank tenderness MSkel - Back: full range of motion of thoracic and lumbar spine. Non tender to palpation. DTR's are symmetrically intact. Sensation intact in all dermatomes of the lower extremities. Full strength to manual muscle testing. patient is able to heel toe walk without difficulty and ambulates with mildly  antalgic gait. Skin - no bruising or redness Psychiatric: She has an approp depressed mood and affect. Her behavior is normal. Judgment and thought content normal.   Lab Results  Component Value Date   WBC 4.3 05/08/2011   HGB 9.8* 05/08/2011   HCT 29.7* 05/08/2011   PLT 171 05/08/2011   ALT 101* 05/08/2011   AST 116* 05/08/2011   NA 138 05/08/2011   K 3.6 05/08/2011   CL 103 05/08/2011   CREATININE 1.04 05/08/2011   BUN 14 05/08/2011   CO2 25 05/08/2011   TSH 1.700 05/16/2010   INR 0.98 05/07/2011   HGBA1C 5.2 09/19/2008   CT A/P 06/09/11 - increase in retroperitoneal LN size left side, s/p hepatic ablation of met       Assessment & Plan:  See problem list. Medications and labs reviewed today.  L flank pain - suspect due to RP mets/LN increase - increase norco dose and add gabapentin - follow up onc next week as planned - support offered  Time spent with pt today 25 minutes, greater than 50% time spent counseling patient on CT scan report 06/09/11, L flank pain and increase LN size, LLE DVT 04/2011 + anticoag, ongoing cancer treatment

## 2011-07-12 ENCOUNTER — Encounter: Payer: Self-pay | Admitting: Internal Medicine

## 2011-07-12 ENCOUNTER — Ambulatory Visit (INDEPENDENT_AMBULATORY_CARE_PROVIDER_SITE_OTHER): Payer: 59 | Admitting: Internal Medicine

## 2011-07-12 ENCOUNTER — Ambulatory Visit: Payer: 59 | Admitting: Physical Therapy

## 2011-07-12 VITALS — BP 122/80 | HR 87 | Resp 18 | Ht 62.0 in | Wt 224.0 lb

## 2011-07-12 DIAGNOSIS — I10 Essential (primary) hypertension: Secondary | ICD-10-CM

## 2011-07-12 DIAGNOSIS — I5032 Chronic diastolic (congestive) heart failure: Secondary | ICD-10-CM

## 2011-07-12 DIAGNOSIS — I509 Heart failure, unspecified: Secondary | ICD-10-CM

## 2011-07-12 DIAGNOSIS — I4891 Unspecified atrial fibrillation: Secondary | ICD-10-CM

## 2011-07-12 NOTE — Patient Instructions (Signed)
Your physician wants you to follow-up in: 6 months with Dr. Allred. You will receive a reminder letter in the mail two months in advance. If you don't receive a letter, please call our office to schedule the follow-up appointment.  

## 2011-07-12 NOTE — Assessment & Plan Note (Signed)
euvolemic today She takes lasix as needed

## 2011-07-12 NOTE — Assessment & Plan Note (Signed)
Stable No change required today  

## 2011-07-12 NOTE — Progress Notes (Signed)
PCP: [name], MD, MD  [name] is a 61 y.o. female who presents today for routine cardiology followup.  Since last being seen in our clinic, the patient has struggled with ongoing malignancy.  She has had recurrent DVT and is on lovenox.  Her afib appears to be stable.  Today, she denies symptoms of palpitations, chest pain, shortness of breath,  lower extremity edema, dizziness, presyncope, or syncope.  The patient is otherwise without complaint today.   Past Medical History  Diagnosis Date  . Diastolic dysfunction   . MIGRAINE HEADACHE   . Atrial fibrillation     failed DCCV, CHADs2-prev pradaxa stopped due to hematuria with ureter issues/JJ   . Anemia   . HTN (hypertension)   . GLAUCOMA   . CARPAL TUNNEL SYNDROME, RIGHT   . Lymphedema     L>R leg from pelvic XRT/scarring  . Hepatic steatosis     CT scan 06/2009, 12/2009  . DVT (deep venous thrombosis) 10/2010    LLE, anticaog resumed  . CHF (congestive heart failure)     1 yr ago per pt  . Dyspnea on exertion   . Arthritis   . GERD (gastroesophageal reflux disease)   . Lesion of liver   . Small kidney     left  . Difficulty sleeping   . Cervical cancer dx 2005, recur 04/2008    s/p chemo/XRT; recurrent dz L pelvis dx 2010   Past Surgical History  Procedure Date  . Total abdominal hysterectomy   . Cholecystectomy   . Ureteral stent placement 2005, 01/2010, 07/2010    L hydro related to cervical ca and XRT  . Oophorectomy   . Cardioversion   . Tonsillectomy     Current Outpatient Prescriptions  Medication Sig Dispense Refill  . diltiazem (CARDIZEM CD) 120 MG 24 hr capsule Take 120 mg by mouth daily before breakfast.      . enoxaparin (LOVENOX) 100 MG/ML injection Take 1 injection every 12 hours      . furosemide (LASIX) 40 MG tablet Take 1 tablet (40 mg total) by mouth as needed.  30 tablet  1  . HYDROcodone-acetaminophen (NORCO) 10-325 MG per tablet Take 1 tablet by mouth every 4 (four) hours as needed for  pain.  40 tablet  1  . ibuprofen (ADVIL,MOTRIN) 200 MG tablet Take 400-600 mg by mouth every 6 (six) hours as needed. Pain      . metoprolol (LOPRESSOR) 50 MG tablet Take 50 mg by mouth 2 (two) times daily.       . Multiple Vitamin (MULTIVITAMIN) tablet Take 1 tablet by mouth daily.        [name] omeprazole (PRILOSEC) 20 MG capsule Take 20 mg by mouth daily.       . sodium chloride (OCEAN) 0.65 % nasal spray Place 1 spray into the nose as needed. Allergies      . gabapentin (NEURONTIN) 300 MG capsule Take 1 capsule (300 mg total) by mouth at bedtime.  30 capsule  2    Physical Exam: Filed Vitals:   07/12/11 1616  BP: 122/80  Pulse: 87  Resp: 18  Height: 5\' 2"  (1.575 m)  Weight: 224 lb (101.606 kg)    GEN- The patient is well appearing, alert and oriented x 3 today.   Head- normocephalic, atraumatic Eyes-  Sclera clear, conjunctiva pink Ears- hearing intact Oropharynx- clear Lungs- Clear to ausculation bilaterally, normal work of breathing Heart- irregular rate and rhythm, no murmurs, rubs or  gallops, PMI not laterally displaced GI- soft, NT, ND, + BS Extremities- no clubbing, cyanosis, +2 L leg edema  ekg today reveals afib, V rate 87, otherwise normal ekg  Assessment and Plan:

## 2011-07-12 NOTE — Assessment & Plan Note (Signed)
Rate controlled On lovenox for treatment of DVT and afib  No changes today

## 2011-07-14 ENCOUNTER — Ambulatory Visit: Payer: 59

## 2011-07-15 ENCOUNTER — Telehealth: Payer: Self-pay | Admitting: Gynecologic Oncology

## 2011-07-15 NOTE — Telephone Encounter (Signed)
Message left for patient with recommendation for PET scan in 3 months per Dr. Stanford Breed.  Informed that the appointment for this Monday Jul 19, 2011 will be rescheduled to September 24, 2011 with a PET scan prior.  Instructed to call back for any questions or concerns.

## 2011-07-16 ENCOUNTER — Ambulatory Visit: Payer: 59 | Admitting: Physical Therapy

## 2011-07-19 ENCOUNTER — Encounter: Payer: Self-pay | Admitting: Gynecology

## 2011-07-19 ENCOUNTER — Ambulatory Visit: Payer: 59 | Admitting: Gynecology

## 2011-07-19 ENCOUNTER — Ambulatory Visit: Payer: 59 | Attending: Gynecology | Admitting: Gynecology

## 2011-07-19 ENCOUNTER — Ambulatory Visit: Payer: 59

## 2011-07-19 VITALS — BP 128/76 | HR 66 | Temp 98.2°F | Resp 16 | Ht 61.5 in | Wt 224.2 lb

## 2011-07-19 DIAGNOSIS — Z86718 Personal history of other venous thrombosis and embolism: Secondary | ICD-10-CM | POA: Insufficient documentation

## 2011-07-19 DIAGNOSIS — Z79899 Other long term (current) drug therapy: Secondary | ICD-10-CM | POA: Insufficient documentation

## 2011-07-19 DIAGNOSIS — Z87891 Personal history of nicotine dependence: Secondary | ICD-10-CM | POA: Insufficient documentation

## 2011-07-19 DIAGNOSIS — K219 Gastro-esophageal reflux disease without esophagitis: Secondary | ICD-10-CM | POA: Insufficient documentation

## 2011-07-19 DIAGNOSIS — I4891 Unspecified atrial fibrillation: Secondary | ICD-10-CM | POA: Insufficient documentation

## 2011-07-19 DIAGNOSIS — C539 Malignant neoplasm of cervix uteri, unspecified: Secondary | ICD-10-CM | POA: Insufficient documentation

## 2011-07-19 DIAGNOSIS — I10 Essential (primary) hypertension: Secondary | ICD-10-CM | POA: Insufficient documentation

## 2011-07-19 DIAGNOSIS — D649 Anemia, unspecified: Secondary | ICD-10-CM | POA: Insufficient documentation

## 2011-07-19 DIAGNOSIS — I509 Heart failure, unspecified: Secondary | ICD-10-CM | POA: Insufficient documentation

## 2011-07-19 NOTE — Patient Instructions (Signed)
PET scan will be scheduled for July and I will plan on seeing you to review those results shortly after the scan was obtained.

## 2011-07-19 NOTE — Progress Notes (Signed)
Consult Note: Gyn-Onc   [name] 61 y.o. female  Chief Complaint  Patient presents with  . Cervical Cancer    Follow up    Interval History: the patient returns today to review her CT scan performed on April 10. The CT scan was obtained primarily to evaluate the liver lesion which had undergone RFA on 05/07/2011. Also noted were some enlarged para-aortic lymph nodes measuring 15 x 16 mm. These are not significantly enlarged in comparison to a January 2013 CT scan that were increased from a CT of November 2012.  The patient is otherwise doing well. She's undergoing physical therapy for deep vein thrombosis of left lower extremity. She continues take Lovenox. She has no GI or GU symptoms.  HPI:[name] underwent a radical hysterectomy bilateral salpingo-oophorectomy and pelvic lymphadenectomy in March of 2005 for a stage IB cervical cancer. Final pathology showed no residual disease and all lymph nodes were negative.  The patient was followed for 5 years with no evidence of recurrent disease until she developed a recurrence of the right pelvic sidewall in February of 2010. This caused obstruction of the right ureter. She was treated with radiation therapy and concurrent cisplatin chemotherapy.  In late 2011 the patient was found to have recurrence of the right inguinal lymph node and left pelvic nodes and received additional radiation therapy to those areas. This radiation was completed in January 2012. In April of 2012 the patient had resolution of the right inguinal node. In August 2012 the patient was found to have a liver lesion assuring 2.8 cm and received 6 cycles of Taxol as noted above. She had a partial response to chemo, but theliver lesion began to increase in size (1.9 cm). Reimaging showed that there were no other lesions except the liver and we elected to ablate the liver lesion using RFA. This was performed on 05/07/2011. The liver procedure itself went well L. 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      Review of Systems:10 point review of systems is negative as noted above.   Vitals: Blood pressure 128/76, pulse 66, temperature 98.2 F (36.8 C), temperature source Oral, resp. rate 16, height 5' 1.5" (1.562 m), weight 224 lb 3.2 oz (101.696 kg).  Physical Exam: General : The patient is a healthy woman in no acute distress.  HEENT: normocephalic, extraoccular movements normal; neck is supple without thyromegally  Lynphnodes: Supraclavicular and inguinal nodes not enlarged  Abdomen: Soft, non-tender, no ascites, no organomegally, no masses, no hernias  Pelvic:  EGBUS: Normal female  Vagina: Normal, no lesions  Urethra and Bladder: Normal, non-tender  Cervix: Surgically absent  Uterus: Surgically absent  Bi-manual examination: Non-tender; no adenxal masses or nodularity  Rectal: normal sphincter tone, no masses, no blood  Lower extremities  bilateral edema a left worse than right, some erythema of the left lower extremity. Normal range of motion    Assessment/Plan: Recurrent cervical cancer. Given that the. Lymph nodes are relatively small (15 x 16 mm) and the fact that they had not changed in size since January 2013, recommend the patient had a PET scan to reassess of the lymph nodes (which had been previously negative on PET scan) as well as reassess the liver lesion in July. All of these findings and plan were discussed in detail with the patient. She is in agreement with this plan and I  will plan on seeing her back shortly after the PET scan was obtained in July.  Allergies  Allergen Reactions  . Codeine     REACTION: Breathing issues and rash 40 years ago  . Flecainide Acetate   . Morphine     REACTION: Hallucinations  . Propafenone Hcl     LEG CRAMPS    Past Medical History  Diagnosis Date  . Diastolic dysfunction   .  MIGRAINE HEADACHE   . Atrial fibrillation     failed DCCV, CHADs2-prev pradaxa stopped due to hematuria with ureter issues/JJ   . Anemia   . HTN (hypertension)   . GLAUCOMA   . CARPAL TUNNEL SYNDROME, RIGHT   . Lymphedema     L>R leg from pelvic XRT/scarring  . Hepatic steatosis     CT scan 06/2009, 12/2009  . DVT (deep venous thrombosis) 10/2010    LLE, anticaog resumed  . CHF (congestive heart failure)     1 yr ago per pt  . Dyspnea on exertion   . Arthritis   . GERD (gastroesophageal reflux disease)   . Lesion of liver   . Small kidney     left  . Difficulty sleeping   . Cervical cancer dx 2005, recur 04/2008    s/p chemo/XRT; recurrent dz L pelvis dx 2010    Past Surgical History  Procedure Date  . Total abdominal hysterectomy   . Cholecystectomy   . Ureteral stent placement 2005, 01/2010, 07/2010    L hydro related to cervical ca and XRT  . Oophorectomy   . Cardioversion   . Tonsillectomy     Current Outpatient Prescriptions  Medication Sig Dispense Refill  . diltiazem (CARDIZEM CD) 120 MG 24 hr capsule Take 120 mg by mouth daily before breakfast.      . enoxaparin (LOVENOX) 100 MG/ML injection Take 1 injection every 12 hours      . furosemide (LASIX) 40 MG tablet Take 1 tablet (40 mg total) by mouth as needed.  30 tablet  1  . gabapentin (NEURONTIN) 300 MG capsule Take 1 capsule (300 mg total) by mouth at bedtime.  30 capsule  2  . HYDROcodone-acetaminophen (NORCO) 10-325 MG per tablet Take 1 tablet by mouth every 4 (four) hours as needed for pain.  40 tablet  1  . ibuprofen (ADVIL,MOTRIN) 200 MG tablet Take 400-600 mg by mouth every 6 (six) hours as needed. Pain      . metoprolol (LOPRESSOR) 50 MG tablet Take 50 mg by mouth 2 (two) times daily.       . Multiple Vitamin (MULTIVITAMIN) tablet Take 1 tablet by mouth daily.        [name] omeprazole (PRILOSEC) 20 MG capsule Take 20 mg by mouth daily.       . sodium chloride (OCEAN) 0.65 % nasal spray Place 1 spray into the  nose as needed. Allergies        History   Social History  . Marital Status: Single    Spouse Name: N/A    Number of Children: N/A  . Years of Education: N/A   Occupational History  . Not on file.   Social History Main Topics  . Smoking status: Former Smoker -- 0.5 packs/day for 3 years    Quit date: 03/01/1984  . Smokeless tobacco: Never Used  . Alcohol Use: No  . Drug Use: No  . Sexually Active: No   Other Topics Concern  . Not on file   Social History  Narrative   Lives with significant other in [place], [place].Loan processor at [employer] grown son and 2 g-sons    Family History  Problem Relation Age of Onset  . Lung cancer    . Hypertension    . Heart disease    . Stroke    . Asthma    . Asthma Son       Jeannette Corpus, MD 07/19/2011, 8:30 AM

## 2011-07-21 ENCOUNTER — Telehealth: Payer: Self-pay

## 2011-07-21 ENCOUNTER — Ambulatory Visit: Payer: 59

## 2011-07-21 NOTE — Telephone Encounter (Signed)
Received call from pt stating that [facility] outpt rehab informed her that 2 requests were faxed over to this office for her to get a compression sleeve, to be signed by MD, and they have not received this.  Informed her this RN has not seen this request, but will call their office so this can be re-faxed.  Left message on their vm at [facility] location [phone] to re-fax this to [phone].

## 2011-07-22 ENCOUNTER — Ambulatory Visit: Payer: 59 | Admitting: Physical Therapy

## 2011-07-23 ENCOUNTER — Telehealth: Payer: Self-pay

## 2011-07-23 NOTE — Telephone Encounter (Signed)
Faxed signed orders to [facility]. Dated 07-23-11.  Sent a copy to HIM to be scanned into Pt.'s EMR.

## 2011-07-27 ENCOUNTER — Ambulatory Visit: Payer: 59 | Admitting: Physical Therapy

## 2011-07-30 ENCOUNTER — Ambulatory Visit: Payer: 59 | Admitting: Physical Therapy

## 2011-07-31 ENCOUNTER — Emergency Department (HOSPITAL_COMMUNITY)
Admission: EM | Admit: 2011-07-31 | Discharge: 2011-07-31 | Disposition: A | Discharge status: Home / Self Care | Payer: 59 | Attending: Emergency Medicine | Admitting: Emergency Medicine

## 2011-07-31 ENCOUNTER — Encounter (HOSPITAL_COMMUNITY): Payer: Self-pay | Admitting: Emergency Medicine

## 2011-07-31 DIAGNOSIS — M545 Low back pain, unspecified: Secondary | ICD-10-CM | POA: Insufficient documentation

## 2011-07-31 DIAGNOSIS — K219 Gastro-esophageal reflux disease without esophagitis: Secondary | ICD-10-CM | POA: Insufficient documentation

## 2011-07-31 DIAGNOSIS — I509 Heart failure, unspecified: Secondary | ICD-10-CM | POA: Insufficient documentation

## 2011-07-31 DIAGNOSIS — R109 Unspecified abdominal pain: Secondary | ICD-10-CM | POA: Insufficient documentation

## 2011-07-31 DIAGNOSIS — I4891 Unspecified atrial fibrillation: Secondary | ICD-10-CM | POA: Insufficient documentation

## 2011-07-31 DIAGNOSIS — I10 Essential (primary) hypertension: Secondary | ICD-10-CM | POA: Insufficient documentation

## 2011-07-31 LAB — URINALYSIS, ROUTINE W REFLEX MICROSCOPIC
Glucose, UA: NEGATIVE mg/dL
Specific Gravity, Urine: 1.017 (ref 1.005–1.030)
pH: 5.5 (ref 5.0–8.0)

## 2011-07-31 LAB — URINE MICROSCOPIC-ADD ON

## 2011-07-31 LAB — DIFFERENTIAL
Basophils Absolute: 0 10*3/uL (ref 0.0–0.1)
Basophils Relative: 0 % (ref 0–1)
Eosinophils Relative: 1 % (ref 0–5)
Monocytes Absolute: 0.6 10*3/uL (ref 0.1–1.0)
Neutro Abs: 4.7 10*3/uL (ref 1.7–7.7)

## 2011-07-31 LAB — CBC
HCT: 33.7 % — ABNORMAL LOW (ref 36.0–46.0)
MCHC: 34.1 g/dL (ref 30.0–36.0)
MCV: 85.3 fL (ref 78.0–100.0)
Platelets: 195 10*3/uL (ref 150–400)
RDW: 15 % (ref 11.5–15.5)

## 2011-07-31 LAB — COMPREHENSIVE METABOLIC PANEL
AST: 19 U/L (ref 0–37)
Albumin: 4 g/dL (ref 3.5–5.2)
Calcium: 9.9 mg/dL (ref 8.4–10.5)
Creatinine, Ser: 0.99 mg/dL (ref 0.50–1.10)

## 2011-07-31 LAB — LIPASE, BLOOD: Lipase: 19 U/L (ref 11–59)

## 2011-07-31 MED ORDER — ONDANSETRON HCL 4 MG/2ML IJ SOLN
4.0000 mg | Freq: Once | INTRAMUSCULAR | Status: AC
  Administered 2011-07-31: 4 mg via INTRAVENOUS
  Filled 2011-07-31: qty 2

## 2011-07-31 MED ORDER — SODIUM CHLORIDE 0.9 % IV BOLUS (SEPSIS)
500.0000 mL | Freq: Once | INTRAVENOUS | Status: AC
  Administered 2011-07-31: 500 mL via INTRAVENOUS

## 2011-07-31 MED ORDER — OXYCODONE-ACETAMINOPHEN 5-325 MG PO TABS: 2.0000 | ORAL_TABLET | ORAL | Status: DC | PRN

## 2011-07-31 MED ORDER — HYDROMORPHONE HCL PF 1 MG/ML IJ SOLN
1.0000 mg | Freq: Once | INTRAMUSCULAR | Status: AC
  Administered 2011-07-31: 1 mg via INTRAVENOUS
  Filled 2011-07-31: qty 1

## 2011-07-31 NOTE — ED Notes (Signed)
PT. REPORTS LEFT ABDOMINAL PAIN / LEFT BACK PAIN FOR 2 MONTHS WORSE THIS EVENING , DENIES NAUSEA OR VOMITTING .

## 2011-07-31 NOTE — ED Provider Notes (Signed)
History     CSN: [id number]  Arrival date & time 07/31/11  0240   First MD Initiated Contact with Patient 07/31/11 0251      Chief Complaint  Patient presents with  . Abdominal Pain    (Consider location/radiation/quality/duration/timing/severity/associated sxs/prior treatment) Patient is a 61 y.o. female presenting with abdominal pain. The history is provided by the patient and the spouse.  Abdominal Pain The primary symptoms of the illness include abdominal pain.   Patient presents to emergency department complaining of a 2-3 month history of intermittent left lower abdominal pain and left lower back pain. Patient states this pain has been evaluated by her primary care provider in the past with no significant findings. Patient states that she had a CT scan in the recent past for similar pain without acute findings. Patient states she's been taking Neurontin and hydrocodone-acetaminophen for pain without relief of symptoms. Patient states that she took multiple doses of pain medicine tonight due to pain but presents the emergency department due to unresolved ongoing pain. She states pain is similar to the pain that she's been having for multiple months. Patient states that over the last 2-3 months she's had only 2-3 episodes of vomiting but denies any current vomiting. She denies any diarrhea. She denies fevers, chills, chest pain, shortness of breath, dysuria, hematuria, blood in her stool. Patient has history of hysterectomy, oophorectomy, cholecystectomy, and remote history of stent placement in her ureter due to obstruction from a mass. Patient states she is followed by urology but no longer has stent placement. She denies any changes in her urination. Patient denies aggravating or alleviating factors. Past Medical History  Diagnosis Date  . Diastolic dysfunction   . MIGRAINE HEADACHE   . Atrial fibrillation     failed DCCV, CHADs2-prev pradaxa stopped due to hematuria with ureter  issues/JJ   . Anemia   . HTN (hypertension)   . GLAUCOMA   . CARPAL TUNNEL SYNDROME, RIGHT   . Lymphedema     L>R leg from pelvic XRT/scarring  . Hepatic steatosis     CT scan 06/2009, 12/2009  . DVT (deep venous thrombosis) 10/2010    LLE, anticaog resumed  . CHF (congestive heart failure)     1 yr ago per pt  . Dyspnea on exertion   . Arthritis   . GERD (gastroesophageal reflux disease)   . Lesion of liver   . Small kidney     left  . Difficulty sleeping   . Cervical cancer dx 2005, recur 04/2008    s/p chemo/XRT; recurrent dz L pelvis dx 2010    Past Surgical History  Procedure Date  . Total abdominal hysterectomy   . Cholecystectomy   . Ureteral stent placement 2005, 01/2010, 07/2010    L hydro related to cervical ca and XRT  . Oophorectomy   . Cardioversion   . Tonsillectomy     Family History  Problem Relation Age of Onset  . Lung cancer    . Hypertension    . Heart disease    . Stroke    . Asthma    . Asthma Son     History  Substance Use Topics  . Smoking status: Former Smoker -- 0.5 packs/day for 3 years    Quit date: 03/01/1984  . Smokeless tobacco: Never Used  . Alcohol Use: No    OB History    Grav Para Term Preterm Abortions TAB SAB Ect Mult Living  Review of Systems  Gastrointestinal: Positive for abdominal pain.  All other systems reviewed and are negative.    Allergies  Codeine; Flecainide acetate; Morphine; and Propafenone hcl  Home Medications   Current Outpatient Rx  Name Route Sig Dispense Refill  . DILTIAZEM HCL ER COATED BEADS 120 MG PO CP24 Oral Take 120 mg by mouth daily before breakfast.    . ENOXAPARIN SODIUM 100 MG/ML [place] SOLN  Take 1 injection every 12 hours    . GABAPENTIN 300 MG PO CAPS Oral Take 1 capsule (300 mg total) by mouth at bedtime. 30 capsule 2  . HYDROCODONE-ACETAMINOPHEN 10-325 MG PO TABS Oral Take 0.5-1 tablets by mouth every 6 (six) hours as needed. pain    . IBUPROFEN 200 MG PO TABS  Oral Take 400-600 mg by mouth every 6 (six) hours as needed. Pain    . METOPROLOL TARTRATE 50 MG PO TABS Oral Take 50 mg by mouth 2 (two) times daily.     . ADULT MULTIVITAMIN W/MINERALS CH Oral Take 1 tablet by mouth daily.    [name] OMEPRAZOLE 20 MG PO CPDR Oral Take 20 mg by mouth daily.     [name] SALINE NASAL SPRAY 0.65 % NA SOLN Nasal Place 1 spray into the nose as needed. Allergies    . FUROSEMIDE 40 MG PO TABS Oral Take 1 tablet (40 mg total) by mouth as needed. 30 tablet 1    BP 143/95  Pulse 111  Temp 98.2 F (36.8 C)  Resp 18  SpO2 96%  Physical Exam  Nursing note and vitals reviewed. Constitutional: She is oriented to person, place, and time. She appears well-developed and well-nourished. No distress.  HENT:  Head: Normocephalic and atraumatic.  Eyes: Conjunctivae are normal.  Neck: Normal range of motion. Neck supple.  Cardiovascular: Normal rate, regular rhythm, normal heart sounds and intact distal pulses.  Exam reveals no gallop and no friction rub.   No murmur heard. Pulmonary/Chest: Effort normal and breath sounds normal. No respiratory distress. She has no wheezes. She has no rales. She exhibits no tenderness.  Abdominal: Soft. Bowel sounds are normal. She exhibits no distension and no mass. There is tenderness. There is no rebound and no guarding.       Mild TTP of LLQ without guarding or rigidity. No peritoneal signs.   Musculoskeletal: Normal range of motion. She exhibits no edema and no tenderness.  Neurological: She is alert and oriented to person, place, and time.  Skin: Skin is warm and dry. No rash noted. She is not diaphoretic. No erythema.  Psychiatric: She has a normal mood and affect.    ED Course  Procedures (including critical care time)  Patient evaluated by Dr Bebe Shaggy at bedside who agrees that patient has a non acute abdomen with the same pain for months with the same pain during her recent CT that showed no acute findings. We will dispo based on pain  control and pending labs but will not image at this time given non acute abdomen unless symptoms change or worsen before dispo  IV dilaudid and zofran.   Labs Reviewed  CBC - Abnormal; Notable for the following:    Hemoglobin 11.5 (*)    HCT 33.7 (*)    All other components within normal limits  COMPREHENSIVE METABOLIC PANEL - Abnormal; Notable for the following:    Glucose, Bld 122 (*)    Alkaline Phosphatase 130 (*)    GFR calc non Af Amer 61 (*)  GFR calc Af Amer 71 (*)    All other components within normal limits  URINALYSIS, ROUTINE W REFLEX MICROSCOPIC - Abnormal; Notable for the following:    Hgb urine dipstick MODERATE (*)    Protein, ur 30 (*)    Leukocytes, UA MODERATE (*)    All other components within normal limits  URINE MICROSCOPIC-ADD ON - Abnormal; Notable for the following:    Squamous Epithelial / LPF FEW (*)    Bacteria, UA FEW (*)    All other components within normal limits  DIFFERENTIAL  LIPASE, BLOOD  URINE CULTURE   No results found.   1. Abdominal pain       MDM  Patient states pain is now 2/10. She states it's "much more tolerable now." Patient states this is the same pain for 2 months with a nonacute abdomen and no acute findings on labs. At this time we will not repeat any imaging of her abdomen given it is the same pain she had in March when she had no acute findings on her CT scan. Patient is agreeable to following up closely with her primary care provider in her specialist for further evaluation and management of abdominal pain but is agreeable to returning to the emergency department any time for changing or worsening symptoms.        [place], [place] 07/31/11 [phone]

## 2011-07-31 NOTE — ED Provider Notes (Signed)
Medical screening examination/treatment/procedure(s) were conducted as a shared visit with non-physician practitioner(s) and myself.  I personally evaluated the patient during the encounter  Pt stable on my exam, abdomen soft, no distress, doubt acute abd/urologic process  Joya Gaskins, MD 07/31/11 [phone]

## 2011-07-31 NOTE — Discharge Instructions (Signed)
Take percocet as directed, as needed for pain but do not drive or operate machinery with use. Follow up with Dr. Felicity Coyer in the near future for further evaluation of ongoing abdominal pain but return to ER for any changing or worsening of symptoms.

## 2011-07-31 NOTE — ED Notes (Signed)
Pt c/o LLQ pain worsening over past 2 days.  Does not c/o n/v/d.  HX of abdominal pain with back pain.  Ambulatory to restroom, alert and oriented x 4, family at bedside.

## 2011-07-31 NOTE — ED Notes (Signed)
Rx x 1.  Pt voiced understanding to f/u with PCP and return for worsening condition.  

## 2011-07-31 NOTE — ED Notes (Signed)
PA at bedside to speak with pt and family

## 2011-08-01 LAB — URINE CULTURE
Colony Count: 6000
Culture  Setup Time: 201306011150

## 2011-08-03 ENCOUNTER — Ambulatory Visit (INDEPENDENT_AMBULATORY_CARE_PROVIDER_SITE_OTHER)
Admission: RE | Admit: 2011-08-03 | Discharge: 2011-08-03 | Disposition: A | Discharge status: Home / Self Care | Payer: 59 | Source: Ambulatory Visit | Attending: Internal Medicine | Admitting: Internal Medicine

## 2011-08-03 ENCOUNTER — Encounter: Payer: Self-pay | Admitting: Internal Medicine

## 2011-08-03 ENCOUNTER — Ambulatory Visit (INDEPENDENT_AMBULATORY_CARE_PROVIDER_SITE_OTHER): Payer: 59 | Admitting: Internal Medicine

## 2011-08-03 VITALS — BP 110/80 | HR 100 | Temp 97.8°F | Ht 61.0 in | Wt 224.5 lb

## 2011-08-03 DIAGNOSIS — F329 Major depressive disorder, single episode, unspecified: Secondary | ICD-10-CM

## 2011-08-03 DIAGNOSIS — R109 Unspecified abdominal pain: Secondary | ICD-10-CM

## 2011-08-03 DIAGNOSIS — I10 Essential (primary) hypertension: Secondary | ICD-10-CM

## 2011-08-03 NOTE — Assessment & Plan Note (Addendum)
Chronic persistent with known abd LA not thought to be enough to account for the pain, mild diverticulosis only on last colonscopy 2011, April 2013 CT abd neg for other acute, recent labs from ER visit unrevealing, Urine cx neg;  Has mild left tender  On exam today but of ? Clinical signficance;  My thought today would be ? Referred pain from spinal dz?  - for films today, also consider GI eval

## 2011-08-03 NOTE — Patient Instructions (Addendum)
Continue all other medications as before Please go to XRAY in the Basement for the x-ray test You will be contacted by phone if any changes need to be made immediately.  Otherwise, you will receive a letter about your results with an explanation. We may need to consider GI referral if the films are negative and the pain persists

## 2011-08-06 ENCOUNTER — Other Ambulatory Visit: Payer: Self-pay | Admitting: Internal Medicine

## 2011-08-06 MED ORDER — METOPROLOL TARTRATE 50 MG PO TABS: 50.0000 mg | ORAL_TABLET | Freq: Two times a day (BID) | ORAL | Status: DC

## 2011-08-08 ENCOUNTER — Encounter: Payer: Self-pay | Admitting: Internal Medicine

## 2011-08-08 NOTE — Progress Notes (Signed)
Subjective:    Patient ID: [name], female    DOB: 20-Nov-1950, 61 y.o.   MRN: [id number]  HPI  Here to f/u after ER visit with persistent left sided abd pain, ongoing for 2-3 mo, intermittent, sometimes assoc with left lower back pain.  Mar 2013 CT abd with ? Concern for worsening LA but pt has seen Dr Loree Fee and discomfort not felt related.  Current pain meds not helping .  No wt loss, n/v, or bowel or bladder change. Patient has history of hysterectomy, oophorectomy, cholecystectomy, and remote history of stent placement in her ureter due to obstruction from a mass .   No overt spine pain or LE radicular pain/weak/numb.  Has not had spine eval to date. Denies worsening depressive symptoms, suicidal ideation, or panic. Past Medical History  Diagnosis Date  . Diastolic dysfunction   . MIGRAINE HEADACHE   . Atrial fibrillation     failed DCCV, CHADs2-prev pradaxa stopped due to hematuria with ureter issues/JJ   . Anemia   . HTN (hypertension)   . GLAUCOMA   . CARPAL TUNNEL SYNDROME, RIGHT   . Lymphedema     L>R leg from pelvic XRT/scarring  . Hepatic steatosis     CT scan 06/2009, 12/2009  . DVT (deep venous thrombosis) 10/2010    LLE, anticaog resumed  . CHF (congestive heart failure)     1 yr ago per pt  . Dyspnea on exertion   . Arthritis   . GERD (gastroesophageal reflux disease)   . Lesion of liver   . Small kidney     left  . Difficulty sleeping   . Cervical cancer dx 2005, recur 04/2008    s/p chemo/XRT; recurrent dz L pelvis dx 2010   Past Surgical History  Procedure Date  . Total abdominal hysterectomy   . Cholecystectomy   . Ureteral stent placement 2005, 01/2010, 07/2010    L hydro related to cervical ca and XRT  . Oophorectomy   . Cardioversion   . Tonsillectomy     reports that she quit smoking about 27 years ago. She has never used smokeless tobacco. She reports that she does not drink alcohol or use illicit drugs. family history includes Asthma in  her son and unspecified family member; Heart disease in an unspecified family member; Hypertension in an unspecified family member; Lung cancer in an unspecified family member; and Stroke in an unspecified family member. Allergies  Allergen Reactions  . Codeine     REACTION: Breathing issues and rash 40 years ago  . Flecainide Acetate   . Morphine     REACTION: Hallucinations  . Propafenone Hcl     LEG CRAMPS   Current Outpatient Prescriptions on File Prior to Visit  Medication Sig Dispense Refill  . diltiazem (CARDIZEM CD) 120 MG 24 hr capsule Take 120 mg by mouth daily before breakfast.      . enoxaparin (LOVENOX) 100 MG/ML injection Take 1 injection every 12 hours      . furosemide (LASIX) 40 MG tablet Take 1 tablet (40 mg total) by mouth as needed.  30 tablet  1  . gabapentin (NEURONTIN) 300 MG capsule Take 1 capsule (300 mg total) by mouth at bedtime.  30 capsule  2  . HYDROcodone-acetaminophen (NORCO) 10-325 MG per tablet Take 0.5-1 tablets by mouth every 6 (six) hours as needed. pain      . ibuprofen (ADVIL,MOTRIN) 200 MG tablet Take 400-600 mg by mouth every 6 (six) hours as  needed. Pain      . Multiple Vitamin (MULITIVITAMIN WITH MINERALS) TABS Take 1 tablet by mouth daily.      [name] omeprazole (PRILOSEC) 20 MG capsule Take 20 mg by mouth daily.       [name] oxyCODONE-acetaminophen (PERCOCET) 5-325 MG per tablet Take 2 tablets by mouth every 4 (four) hours as needed for pain.  20 tablet  0  . sodium chloride (OCEAN) 0.65 % nasal spray Place 1 spray into the nose as needed. Allergies      . metoprolol (LOPRESSOR) 50 MG tablet Take 1 tablet (50 mg total) by mouth 2 (two) times daily.  60 tablet  5   Review of Systems Review of Systems  Constitutional: Negative for diaphoresis and unexpected weight change.  Eyes: Negative for photophobia and visual disturbance.  Respiratory: Negative for choking and stridor.   Gastrointestinal: Negative for vomiting and blood in stool.  Genitourinary:  Negative for hematuria and decreased urine volume.  Musculoskeletal: Negative for gait problem.  Skin: Negative for color change and wound.  Neurological: Negative for tremors and numbness.     Objective:   Physical Exam BP 110/80  Pulse 100  Temp(Src) 97.8 F (36.6 C) (Oral)  Ht 5\' 1"  (1.549 m)  Wt 224 lb 8 oz (101.833 kg)  BMI 42.42 kg/m2  SpO2 98% Physical Exam  VS noted Constitutional: Pt appears well-developed and well-nourished.  HENT: Head: Normocephalic.  Right Ear: External ear normal.  Left Ear: External ear normal.  Eyes: Conjunctivae and EOM are normal. Pupils are equal, round, and reactive to light.  Neck: Normal range of motion. Neck supple.  Cardiovascular: Normal rate and regular rhythm.   Pulmonary/Chest: Effort normal and breath sounds normal.  Abd:  Soft, NT, non-distended, + BS except for very mild LLQ tender only Neurological: Pt is alert. Not confused, LE motor/sens/dtr intact  Skin: Skin is warm. No erythema. No rash  Spine: nontender Psychiatric: Pt behavior is normal. Thought content normal. mild nervous, not depressed affect    Assessment & Plan:

## 2011-08-08 NOTE — Assessment & Plan Note (Signed)
stable overall by hx and exam, and pt to continue medical treatment as before 

## 2011-08-08 NOTE — Assessment & Plan Note (Signed)
stable overall by hx and exam, most recent data reviewed with pt, and pt to continue medical treatment as before BP Readings from Last 3 Encounters:  08/03/11 110/80  07/31/11 104/66  07/19/11 128/76

## 2011-08-16 ENCOUNTER — Ambulatory Visit (INDEPENDENT_AMBULATORY_CARE_PROVIDER_SITE_OTHER): Payer: 59 | Admitting: Internal Medicine

## 2011-08-16 ENCOUNTER — Encounter: Payer: Self-pay | Admitting: Internal Medicine

## 2011-08-16 VITALS — BP 138/92 | HR 103 | Temp 97.5°F | Ht 61.0 in

## 2011-08-16 DIAGNOSIS — C539 Malignant neoplasm of cervix uteri, unspecified: Secondary | ICD-10-CM

## 2011-08-16 DIAGNOSIS — I82409 Acute embolism and thrombosis of unspecified deep veins of unspecified lower extremity: Secondary | ICD-10-CM

## 2011-08-16 MED ORDER — OXYCODONE-ACETAMINOPHEN 5-325 MG PO TABS: 2.0000 | ORAL_TABLET | ORAL | Status: DC | PRN

## 2011-08-16 MED ORDER — RIVAROXABAN 20 MG PO TABS: 20.0000 mg | ORAL_TABLET | Freq: Every day | ORAL | Status: DC

## 2011-08-16 NOTE — Progress Notes (Signed)
Subjective:    Patient ID: [name], female    DOB: April 29, 1950, 61 y.o.   MRN: [id number]  HPI here for L flank pain/LLQ - ongoing >3 months -  ER eval and OV with JJ in past 2 weeks for same reviewed Not relieved with max dose ibuprofen, Norco 7.5 or hydrocodone 10 Not changed by position or activity No radiation of pain into buttock, leg or anterior abdomen ?related to cancer given increase LN mets on CT 05/2011 > onc feels "no" per pt, planning reck PET  Also reviewed chronic medical issues/interval hx:  AF with RVR - difficult mgmt of same, s/p DCCV 09/2010 - briefly off anticoag (pradaxa) due to bleeding complications (hematuria) and low CHADs - but back on anticoag due to LLE DVT 10/2010 - expect lifelong anticoag -No chest pain or palpitations  LLE DVT - dx 10/2010, 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 S/p IVC filter 04/2011 at [facility], remains on LMWH, ?change oral - following with cancer center- complicated by chronic LLE lymphedema, no chest pain or shortness of breath   Cervical cancer, metastatic - dx 04/2003, recurrent dz 2011 - resumed chemo XRT 09/2010 - 03/2011. 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 Diastolic dysfunction   . MIGRAINE HEADACHE   . Atrial fibrillation     failed DCCV, CHADs2-prev pradaxa stopped due to hematuria with ureter issues/JJ   . Anemia   . HTN (hypertension)   . GLAUCOMA   . CARPAL TUNNEL SYNDROME, RIGHT   . Lymphedema     L>R leg from pelvic XRT/scarring  . Hepatic steatosis     CT scan 06/2009, 12/2009  . DVT (deep venous thrombosis) 10/2010    LLE, anticaog resumed  . CHF (congestive heart failure)     1 yr ago per pt  . Dyspnea on exertion   . Arthritis   . GERD (gastroesophageal reflux disease)   . Lesion of liver   . Small kidney     left  . Difficulty sleeping   . Cervical cancer dx 2005, recur 04/2008    s/p  chemo/XRT; recurrent dz L pelvis dx 2010    Review of Systems  Constitutional: Negative for fever, chills, diaphoresis and unexpected weight change.  Cardiovascular: Positive for leg swelling. Negative for chest pain and palpitations.  Genitourinary: Positive for flank pain (left). Negative for dysuria and hematuria.  Musculoskeletal: Negative for joint swelling and gait problem.       Objective:   Physical Exam  BP 138/92  Pulse 103  Temp 97.5 F (36.4 C) (Oral)  Ht 5\' 1"  (1.549 m)  SpO2 97% Wt Readings from Last 3 Encounters:  08/03/11 224 lb 8 oz (101.833 kg)  07/19/11 224 lb 3.2 oz (101.696 kg)  07/12/11 224 lb (101.606 kg)   Constitutional: She is obese but appears well-developed and well-nourished. No distress. Cardiovascular: Irregular rate and rhythm - normal heart sounds.  No murmur heard.  LLE woody edema, from foot to upper thigh with varicose veins - no RLE edema (fatty ankle) Pulmonary/Chest: Effort normal and breath sounds normal. No respiratory distress. She has no wheezes.  Abd: obese, S, NTND, no reproducible L flank tenderness MSkel - Back: full range of motion of thoracic and lumbar spine. Non tender to palpation. DTR's are symmetrically intact. Sensation intact in all dermatomes of the lower extremities. Full strength to  manual muscle testing. patient is able to heel toe walk without difficulty and ambulates with mildly antalgic gait. Skin - no bruising or redness Psychiatric: She has an approp depressed mood and affect. Her behavior is normal. Judgment and thought content normal.   Lab Results  Component Value Date   WBC 6.4 07/31/2011   HGB 11.5* 07/31/2011   HCT 33.7* 07/31/2011   PLT 195 07/31/2011   ALT 33 07/31/2011   AST 19 07/31/2011   NA 137 07/31/2011   K 4.1 07/31/2011   CL 101 07/31/2011   CREATININE 0.99 07/31/2011   BUN 15 07/31/2011   CO2 22 07/31/2011   TSH 1.700 05/16/2010   INR 0.98 05/07/2011   HGBA1C 5.2 09/19/2008   CT A/P 06/09/11 - increase in  retroperitoneal LN size left side, s/p hepatic ablation of met Dg Thoracic Spine 2 View  08/03/2011  *RADIOLOGY REPORT*  Clinical Data: Back pain.  THORACIC SPINE - 2 VIEW  Comparison: None.  Findings: Vertebral body height and alignment are normal. Scattered  anterior and right side endplate spurring is noted.  IMPRESSION: No acute finding.  Degenerative disease.  Original Report Authenticated By: Bernadene Bell. D'ALESSIO, M.D.   Dg Lumbar Spine Complete  08/03/2011  *RADIOLOGY REPORT*  Clinical Data: Chronic left-sided abdominal pain and left lower back pain.  LUMBAR SPINE - COMPLETE 4+ VIEW  Comparison: Bone window images from CT abdomen and pelvis 03/22/2011.  Findings: Five non-rib bearing lumbar vertebrae with anatomic alignment.  No fractures.  Mild disc space narrowing and endplate hypertrophic changes at L3-4, unchanged.  Remaining disc spaces well preserved.  Lower thoracic spondylosis.  No pars defects. Facet degenerative changes at L4-5 and to a much greater degree L5- S1. Visualized sacroiliac joints intact.  Osteopenia suspected. IVC filter.  IMPRESSION: No acute osseous abnormality.  Mild degenerative disc disease and spondylosis at L3-4.  Facet degenerative changes at L5-S1 greater than L4-5.  Osteopenia suspected.  Original Report Authenticated By: Arnell Sieving, M.D.       Assessment & Plan:  See problem list. Medications and labs reviewed today.  L flank pain - ? due to RP mets/LN increase -continue norco + gabapentin -  follow up onc 08/2011 with repeat PET as planned - support offered  Time spent with pt today 25 minutes, greater than 50% time spent counseling patient on CT scan report 06/09/11, L flank pain and increase LN size, LLE DVT 04/2011 + anticoag, ongoing cancer treatment

## 2011-08-16 NOTE — Patient Instructions (Signed)
It was good to see you today. We have reviewed the interval events since your last visit here stop Lovenox and start Xarelto for full dose blood thinner - Your prescription(s) have been submitted to your pharmacy. Please take as directed and contact our office if you believe you are having problem(s) with the medication(s). Continue Percocet for pain discuss the situation with Dr Stanford Breed about your PET scan and your pain - let me know now i can help if needed

## 2011-08-16 NOTE — Assessment & Plan Note (Signed)
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

## 2011-08-16 NOTE — Assessment & Plan Note (Signed)
Dx 04/2003: s/p chemo/debulk/XRT Recurrent dx 2/2011L pelvic wall and liver met - resumed chemo 09/2010 - 03/05/2011 Multiple complications related to same and renal involvement/obstruction -  DVT LLE 10/2010 and 04/2011 likely related to hypercoagulable state from malignancy - s/p IVC filter 04/2011 Hepatic metastatic lesion RF ablation done 05/07/11 by  interventional radiology  continued L flank discomfort, no LLQ - increase LN size on 05/2011 CT - reviewed again with pt today -  to follow up with gyn onc and PET as scheduled 08/2011 continue hydrocodone and neurontin 300mg  qhs - continue Miralax to combat constipation Interval history reviewed in EMR and with patient today

## 2011-08-30 ENCOUNTER — Other Ambulatory Visit: Payer: Self-pay

## 2011-08-30 MED ORDER — OXYCODONE-ACETAMINOPHEN 5-325 MG PO TABS: 2.0000 | ORAL_TABLET | ORAL | Status: DC | PRN

## 2011-08-30 NOTE — Telephone Encounter (Signed)
Pt informed via home VM, Rx in cabinet for pt pick up  

## 2011-09-06 ENCOUNTER — Encounter: Payer: Self-pay | Admitting: Internal Medicine

## 2011-09-06 ENCOUNTER — Ambulatory Visit (INDEPENDENT_AMBULATORY_CARE_PROVIDER_SITE_OTHER): Payer: 59 | Admitting: Internal Medicine

## 2011-09-06 VITALS — BP 118/82 | HR 83 | Temp 97.0°F | Ht 61.0 in | Wt 215.1 lb

## 2011-09-06 DIAGNOSIS — R109 Unspecified abdominal pain: Secondary | ICD-10-CM

## 2011-09-06 DIAGNOSIS — C539 Malignant neoplasm of cervix uteri, unspecified: Secondary | ICD-10-CM

## 2011-09-06 MED ORDER — OXYCODONE-ACETAMINOPHEN 7.5-325 MG PO TABS: 2.0000 | ORAL_TABLET | Freq: Four times a day (QID) | ORAL | Status: DC | PRN

## 2011-09-06 MED ORDER — GABAPENTIN 300 MG PO CAPS: 300.0000 mg | ORAL_CAPSULE | Freq: Three times a day (TID) | ORAL | Status: DC

## 2011-09-06 NOTE — Assessment & Plan Note (Signed)
Chronic persistent with known adenopathy/hx cervical cancer but not thought to be enough to account for the pain mild diverticulosis only on last colonscopy 2011, April 2013 CT abd neg for other acute,  recent labs unrevealing Upcoming PET and gyn onc visit late 08/2011

## 2011-09-06 NOTE — Assessment & Plan Note (Signed)
Dx 04/2003: s/p chemo/debulk/XRT Recurrent dx 2/2011L pelvic wall and liver met - resumed chemo 09/2010 - 03/05/2011 Multiple complications related to same and renal involvement/obstruction -  DVT LLE 10/2010 and 04/2011 likely related to hypercoagulable state from malignancy - s/p IVC filter 04/2011 Hepatic metastatic lesion RF ablation done 05/07/11 by  interventional radiology  continued L flank discomfort, no LLQ - increase LN size on 05/2011 CT - reviewed again with pt today -  to follow up with gyn onc and PET as scheduled 08/2011 continue oxycodone (increase dose) and neurontin (increase dose) 300mg  tid - continue Miralax to combat constipation Interval history reviewed in EMR and with patient today

## 2011-09-06 NOTE — Progress Notes (Signed)
Subjective:    Patient ID: [name], female    DOB: 1950-07-25, 61 y.o.   MRN: [id number]  HPI here for ongoing/progressive L flank pain/LLQ - ongoing >4 months (04/2011 onset)-  ER eval and OVs with JJ/me 07/2011 for same reviewed, Not relieved with max dose ibuprofen, Norco 7.5 or hydrocodone 10 Limited duration of releif with percocet 5 Not changed by position or activity No radiation of pain into buttock, leg or anterior abdomen ?related to cancer given increase LN mets on CT 05/2011 > onc feels "no" per pt, planning reck PET end of this month  Also reviewed chronic medical issues/interval hx:  AF with RVR - difficult mgmt of same, s/p DCCV 09/2010 - briefly off anticoag (pradaxa) due to bleeding complications (hematuria) and low CHADs - but back on anticoag due to LLE DVT 10/2010 - expect lifelong anticoag -No chest pain or palpitations  LLE DVT - dx 10/2010, 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 S/p IVC filter 04/2011 at [facility], LMWH changed to oral 07/2011- complicated by chronic LLE lymphedema, no chest pain or shortness of breath   Cervical cancer, metastatic - dx 04/2003, recurrent dz 2011 - resumed chemo XRT 09/2010 - 03/2011. 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 Diastolic dysfunction   . MIGRAINE HEADACHE   . Atrial fibrillation     failed DCCV, CHADs2-prev pradaxa stopped due to hematuria with ureter issues/JJ   . Anemia   . HTN (hypertension)   . GLAUCOMA   . CARPAL TUNNEL SYNDROME, RIGHT   . Lymphedema     L>R leg from pelvic XRT/scarring  . Hepatic steatosis     CT scan 06/2009, 12/2009  . DVT (deep venous thrombosis) 10/2010    LLE, anticaog resumed  . CHF (congestive heart failure)     1 yr ago per pt  . Dyspnea on exertion   . Arthritis   . GERD (gastroesophageal reflux disease)   . Lesion of liver   . Small kidney     left  . Difficulty  sleeping   . Cervical cancer dx 2005, recur 04/2008    s/p chemo/XRT; recurrent dz L pelvis dx 2010    Review of Systems  Constitutional: Negative for fever, chills, diaphoresis and unexpected weight change.  Cardiovascular: Positive for leg swelling. Negative for chest pain and palpitations.  Genitourinary: Positive for flank pain (left). Negative for dysuria and hematuria.  Musculoskeletal: Negative for joint swelling and gait problem.       Objective:   Physical Exam  BP 118/82  Pulse 83  Temp 97 F (36.1 C) (Oral)  Ht 5\' 1"  (1.549 m)  Wt 215 lb 1.9 oz (97.578 kg)  BMI 40.65 kg/m2  SpO2 99% Wt Readings from Last 3 Encounters:  09/06/11 215 lb 1.9 oz (97.578 kg)  08/03/11 224 lb 8 oz (101.833 kg)  07/19/11 224 lb 3.2 oz (101.696 kg)   Constitutional: She is obese but appears well-developed and well-nourished. No distress. Cardiovascular: Irregular rate and rhythm - normal heart sounds.  No murmur heard.  LLE woody edema, from foot to upper thigh with varicose veins - no RLE edema (fatty ankle) Pulmonary/Chest: Effort normal and breath sounds normal. No respiratory distress. She has no wheezes.  Abd: obese, S, NTND, no reproducible L flank tenderness MSkel - Back: full range of motion of thoracic and lumbar spine. Non  tender to palpation. DTR's are symmetrically intact. Sensation intact in all dermatomes of the lower extremities. Full strength to manual muscle testing. patient is able to heel toe walk without difficulty and ambulates with mildly antalgic gait. Skin - no bruising or redness Psychiatric: She has an approp depressed mood and affect. Her behavior is normal. Judgment and thought content normal.   Lab Results  Component Value Date   WBC 6.4 07/31/2011   HGB 11.5* 07/31/2011   HCT 33.7* 07/31/2011   PLT 195 07/31/2011   ALT 33 07/31/2011   AST 19 07/31/2011   NA 137 07/31/2011   K 4.1 07/31/2011   CL 101 07/31/2011   CREATININE 0.99 07/31/2011   BUN 15 07/31/2011   CO2 22  07/31/2011   TSH 1.700 05/16/2010   INR 0.98 05/07/2011   HGBA1C 5.2 09/19/2008   CT A/P 06/09/11 - increase in retroperitoneal LN size left side, s/p hepatic ablation of met Dg Thoracic Spine 2 View  08/03/2011  *RADIOLOGY REPORT*  Clinical Data: Back pain.  THORACIC SPINE - 2 VIEW  Comparison: None.  Findings: Vertebral body height and alignment are normal. Scattered  anterior and right side endplate spurring is noted.  IMPRESSION: No acute finding.  Degenerative disease.  Original Report Authenticated By: Bernadene Bell. D'ALESSIO, M.D.   Dg Lumbar Spine Complete  08/03/2011  *RADIOLOGY REPORT*  Clinical Data: Chronic left-sided abdominal pain and left lower back pain.  LUMBAR SPINE - COMPLETE 4+ VIEW  Comparison: Bone window images from CT abdomen and pelvis 03/22/2011.  Findings: Five non-rib bearing lumbar vertebrae with anatomic alignment.  No fractures.  Mild disc space narrowing and endplate hypertrophic changes at L3-4, unchanged.  Remaining disc spaces well preserved.  Lower thoracic spondylosis.  No pars defects. Facet degenerative changes at L4-5 and to a much greater degree L5- S1. Visualized sacroiliac joints intact.  Osteopenia suspected. IVC filter.  IMPRESSION: No acute osseous abnormality.  Mild degenerative disc disease and spondylosis at L3-4.  Facet degenerative changes at L5-S1 greater than L4-5.  Osteopenia suspected.  Original Report Authenticated By: Arnell Sieving, M.D.       Assessment & Plan:  See problem list. Medications and labs reviewed today.  L flank pain - ? due to RP mets/LN increase CT 05/2011 -continue oxycodoene + gabapentin - (increase dose both) Keep follow up onc 08/2011 with repeat PET as planned - support offered

## 2011-09-06 NOTE — Patient Instructions (Signed)
It was good to see you today. We have reviewed the interval events since your last visit here Increase dose Percocet and gabapentin as discussed for pain - Your prescription(s) have been submitted to your pharmacy (or given to you to submit to your pharmacy). Please take as directed and contact our office if you believe you are having problem(s) with the medication(s). discuss the situation with Dr Stanford Breed about your PET scan and your pain - let me know now i can help if needed

## 2011-09-09 ENCOUNTER — Telehealth: Payer: Self-pay | Admitting: Gynecologic Oncology

## 2011-09-09 ENCOUNTER — Other Ambulatory Visit: Payer: Self-pay | Admitting: Gynecologic Oncology

## 2011-09-09 DIAGNOSIS — C539 Malignant neoplasm of cervix uteri, unspecified: Secondary | ICD-10-CM

## 2011-09-09 NOTE — Progress Notes (Signed)
Message left with information about scheduled PET scan for July 17th. Instructed to arrive at [facility] Radiology department at 9:15am.  No food or drink 6 hours before.  Instructed to only take in water after midnight. Instructed to call for any questions or concerns.  Follow up appointment arranged for July 26 with Dr. Stanford Breed.

## 2011-09-09 NOTE — Telephone Encounter (Signed)
Spoke with the patient about the upcoming PET scan on July 17th.  Pt verbalized understanding of instructions and pt informed that Dr. Fredia Sorrow is aware that a PET scan has been ordered to follow up post liver ablation.  Instructed to call for any questions or concerns.

## 2011-09-15 ENCOUNTER — Encounter (HOSPITAL_COMMUNITY)
Admission: RE | Admit: 2011-09-15 | Discharge: 2011-09-15 | Disposition: A | Discharge status: Home / Self Care | Payer: 59 | Source: Ambulatory Visit | Attending: Gynecologic Oncology | Admitting: Gynecologic Oncology

## 2011-09-15 DIAGNOSIS — C539 Malignant neoplasm of cervix uteri, unspecified: Secondary | ICD-10-CM | POA: Insufficient documentation

## 2011-09-15 DIAGNOSIS — C787 Secondary malignant neoplasm of liver and intrahepatic bile duct: Secondary | ICD-10-CM | POA: Insufficient documentation

## 2011-09-15 MED ORDER — FLUDEOXYGLUCOSE F - 18 (FDG) INJECTION
16.6000 | Freq: Once | INTRAVENOUS | Status: AC | PRN
  Administered 2011-09-15: 16.6 via INTRAVENOUS

## 2011-09-16 ENCOUNTER — Other Ambulatory Visit: Payer: Self-pay

## 2011-09-16 MED ORDER — OXYCODONE-ACETAMINOPHEN 7.5-325 MG PO TABS: 2.0000 | ORAL_TABLET | Freq: Four times a day (QID) | ORAL | Status: DC | PRN

## 2011-09-16 NOTE — Telephone Encounter (Signed)
Left message on machine for pt to return my call  

## 2011-09-16 NOTE — Telephone Encounter (Signed)
Pt informed, Rx in cabinet for pt pick up  

## 2011-09-24 ENCOUNTER — Ambulatory Visit: Payer: 59 | Attending: Gynecology | Admitting: Gynecology

## 2011-09-24 ENCOUNTER — Encounter: Payer: Self-pay | Admitting: Gynecology

## 2011-09-24 VITALS — BP 128/80 | HR 68 | Temp 97.9°F | Resp 20 | Wt 219.0 lb

## 2011-09-24 DIAGNOSIS — I10 Essential (primary) hypertension: Secondary | ICD-10-CM | POA: Insufficient documentation

## 2011-09-24 DIAGNOSIS — Z90710 Acquired absence of both cervix and uterus: Secondary | ICD-10-CM | POA: Insufficient documentation

## 2011-09-24 DIAGNOSIS — C539 Malignant neoplasm of cervix uteri, unspecified: Secondary | ICD-10-CM | POA: Insufficient documentation

## 2011-09-24 DIAGNOSIS — C801 Malignant (primary) neoplasm, unspecified: Secondary | ICD-10-CM | POA: Insufficient documentation

## 2011-09-24 DIAGNOSIS — K219 Gastro-esophageal reflux disease without esophagitis: Secondary | ICD-10-CM | POA: Insufficient documentation

## 2011-09-24 DIAGNOSIS — Z87891 Personal history of nicotine dependence: Secondary | ICD-10-CM | POA: Insufficient documentation

## 2011-09-24 DIAGNOSIS — K5900 Constipation, unspecified: Secondary | ICD-10-CM | POA: Insufficient documentation

## 2011-09-24 DIAGNOSIS — Z86718 Personal history of other venous thrombosis and embolism: Secondary | ICD-10-CM | POA: Insufficient documentation

## 2011-09-24 DIAGNOSIS — Z9079 Acquired absence of other genital organ(s): Secondary | ICD-10-CM | POA: Insufficient documentation

## 2011-09-24 DIAGNOSIS — Z79899 Other long term (current) drug therapy: Secondary | ICD-10-CM | POA: Insufficient documentation

## 2011-09-24 NOTE — Patient Instructions (Addendum)
We have arranged an appointment with Dr. Arnette Schaumann on July 31 at 2:30 PM. I would recommend you see the gastroenterologist review previously seen at [facility] for further evaluation of her chronic constipation. The last her primary care physician to consider prescribing OxyContin every 12 hours or chronic pain relief.

## 2011-09-24 NOTE — Progress Notes (Signed)
Consult Note: Gyn-Onc   [name] 61 y.o. female  Chief Complaint  Patient presents with  . Cervical Cancer    Follow up    Interval History:  Interval History:.  The patient returns today as previously scheduled to review the CT scan obtained on 09/15/2011. The CT scan shows increasing enlargement of left periauricular lymph nodes now measuring 2.2 x 1.9 cm. (Previously the lymph node measured 1.4 x 1.6 cm). The patient's main complaint is left mid abdominal pain. She is currently taking Percocet to 7.5 mg tablets every 6 hours, Neurontin 300 mg 3 times a day, and 10-12 ibuprofen daily. The patient is not taking any long-acting pain relief. Her other major problem is that of chronic constipation. The patient reports that she will go days and sometimes weeks between bowel movements despite the use of MiraLAX and other laxatives and enemas. Apparently she had colonoscopy several years ago showing a colonic stricture. 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 She is not having any symptoms associated with a kidney however. HPI:She underwent a radical hysterectomy bilateral salpingo-oophorectomy and pelvic lymphadenectomy in March of 2005 for a stage IB cervical cancer. Final pathology showed no residual disease and all lymph nodes were negative.  The patient was followed for 5 years with no evidence of recurrent disease until she developed a recurrence of the right pelvic sidewall in February of 2010. This caused obstruction of the right ureter. She was treated with radiation therapy and concurrent cisplatin chemotherapy.  In late 2011 the patient was found to have recurrence of the right inguinal lymph node and left pelvic nodes and received additional radiation therapy to those areas. This radiation was completed in January 2012. In April of 2012 the patient had resolution of the right inguinal node. In August 2012 the  patient was found to have a liver lesion assuring 2.8 cm and received 6 cycles of Taxol as noted above. She had a partial response to chemo, but theliver lesion began to increase in size (1.9 cm). Reimaging showed that there were no other lesions except the liver and we elected to ablate the liver lesion using RFA. This was performed on 10/07/2011. The liver procedure itself went well L. 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    Review of Systems:10 point review of systems is negative as noted above.   Vitals: Blood pressure 128/80, pulse 68, temperature 97.9 F (36.6 C), temperature source Oral, resp. rate 20, weight 219 lb (99.338 kg).  Physical Exam: General : The patient is a healthy woman in no acute distress.  HEENT: normocephalic, extraoccular movements normal; neck is supple without thyromegally  Lynphnodes: Supraclavicular and inguinal nodes not enlarged  Abdomen: Soft, non-tender, no ascites, no organomegally, no masses, no hernias  Pelvic:  EGBUS: Normal female  Vagina: Normal, no lesions  Urethra and Bladder: Normal, non-tender  Cervix: Surgically absent  Uterus: Surgically absent  Bi-manual examination: Non-tender; no adenxal masses or nodularity  Rectal: normal sphincter tone, no masses, no blood  Lower extremities: No edema or varicosities. Normal range of motion    Assessment/Plan: Recurrent carcinoma the cervix in the periaortic region. It is my understanding that this region is not been previously radiated and therefore I would recommend the patient be seen by Dr. Arnette Schaumann for  consideration of radiation to the enlarged lymph nodes. Previously the patient is had good responses to radiating pelvic and inguinal nodes.  With regard to her chronic constipation, I would like to refer her back to her gastroenterologist at [facility] health care for further  evaluation.  Regarding her chronic pain, I would suggest that she be given OxyContin 10 mg every 12 hours and use Percocet for breakthrough pain.  She is given an appointment to see Dr. Rosanne Ashing, entered on July 31 at 2:30 PM. Allergies  Allergen Reactions  . Codeine     REACTION: Breathing issues and rash 40 years ago  . Flecainide Acetate   . Morphine     REACTION: Hallucinations  . Propafenone Hcl     LEG CRAMPS    Past Medical History  Diagnosis Date  . Diastolic dysfunction   . MIGRAINE HEADACHE   . Atrial fibrillation     failed DCCV, CHADs2-prev pradaxa stopped due to hematuria with ureter issues/JJ   . Anemia   . HTN (hypertension)   . GLAUCOMA   . CARPAL TUNNEL SYNDROME, RIGHT   . Lymphedema     L>R leg from pelvic XRT/scarring  . Hepatic steatosis     CT scan 06/2009, 12/2009  . DVT (deep venous thrombosis) 10/2010    LLE, anticaog resumed  . CHF (congestive heart failure)     1 yr ago per pt  . Dyspnea on exertion   . Arthritis   . GERD (gastroesophageal reflux disease)   . Lesion of liver   . Small kidney     left  . Difficulty sleeping   . Cervical cancer dx 2005, recur 04/2008    s/p chemo/XRT; recurrent dz L pelvis dx 2010    Past Surgical History  Procedure Date  . Total abdominal hysterectomy   . Cholecystectomy   . Ureteral stent placement 2005, 01/2010, 07/2010    L hydro related to cervical ca and XRT  . Oophorectomy   . Cardioversion   . Tonsillectomy     Current Outpatient Prescriptions  Medication Sig Dispense Refill  . diltiazem (CARDIZEM CD) 120 MG 24 hr capsule Take 120 mg by mouth daily before breakfast.      . furosemide (LASIX) 40 MG tablet Take 1 tablet (40 mg total) by mouth as needed.  30 tablet  1  . gabapentin (NEURONTIN) 300 MG capsule Take 1 capsule (300 mg total) by mouth 3 (three) times daily.  90 capsule  5  . ibuprofen (ADVIL,MOTRIN) 200 MG tablet Take 400-600 mg by mouth every 6 (six) hours as needed. Pain      . metoprolol  (LOPRESSOR) 50 MG tablet Take 1 tablet (50 mg total) by mouth 2 (two) times daily.  60 tablet  5  . Multiple Vitamin (MULITIVITAMIN WITH MINERALS) TABS Take 1 tablet by mouth daily.      [name] omeprazole (PRILOSEC) 20 MG capsule Take 20 mg by mouth daily.       [name] oxyCODONE-acetaminophen (PERCOCET) 7.5-325 MG per tablet Take 2 tablets by mouth every 6 (six) hours as needed for pain.  60 tablet  0  . Rivaroxaban (XARELTO) 20 MG TABS Take 20 mg by mouth daily.  30 tablet  5  . sodium chloride (OCEAN) 0.65 % nasal spray Place 1 spray into the nose as needed. Allergies        History   Social History  . Marital Status: Single    Spouse Name: N/A    Number  of Children: N/A  . Years of Education: N/A   Occupational History  . Not on file.   Social History Main Topics  . Smoking status: Former Smoker -- 0.5 packs/day for 3 years    Quit date: 03/01/1984  . Smokeless tobacco: Never Used  . Alcohol Use: No  . Drug Use: No  . Sexually Active: No   Other Topics Concern  . Not on file   Social History Narrative   Lives with significant other in [place], [place].Loan processor at [employer] grown son and 2 g-sons    Family History  Problem Relation Age of Onset  . Lung cancer    . Hypertension    . Heart disease    . Stroke    . Asthma    . Asthma Son       Jeannette Corpus, MD 09/24/2011, 9:17 AM

## 2011-09-28 ENCOUNTER — Encounter: Payer: Self-pay | Admitting: Internal Medicine

## 2011-09-28 ENCOUNTER — Ambulatory Visit (INDEPENDENT_AMBULATORY_CARE_PROVIDER_SITE_OTHER): Payer: 59 | Admitting: Internal Medicine

## 2011-09-28 VITALS — BP 112/72 | HR 88 | Temp 98.3°F | Ht 61.0 in | Wt 216.1 lb

## 2011-09-28 DIAGNOSIS — C539 Malignant neoplasm of cervix uteri, unspecified: Secondary | ICD-10-CM

## 2011-09-28 DIAGNOSIS — R109 Unspecified abdominal pain: Secondary | ICD-10-CM

## 2011-09-28 DIAGNOSIS — I4891 Unspecified atrial fibrillation: Secondary | ICD-10-CM

## 2011-09-28 MED ORDER — OXYCODONE HCL 20 MG PO TB12: 20.0000 mg | ORAL_TABLET | Freq: Two times a day (BID) | ORAL | Status: DC

## 2011-09-28 MED ORDER — OXYCODONE-ACETAMINOPHEN 7.5-325 MG PO TABS: 1.0000 | ORAL_TABLET | ORAL | Status: DC | PRN

## 2011-09-28 NOTE — Progress Notes (Signed)
Subjective:    Patient ID: [name], female    DOB: Feb 12, 1951, 61 y.o.   MRN: [id number]  HPI here for ongoing/progressive L flank pain/LLQ - ongoing  Since 04/2011  Multiple OVs and ER eval for same reviewed Pain is not relieved with max dose ibuprofen, Norco 7.5 or hydrocodone 10 Limited duration of releif with percocet 5 Not changed by position or activity No radiation of pain into buttock, leg or anterior abdomen Onc feels pain may be related to cancer given increase LN mets on CT 05/2011 > 08/2011 Pending rad onc eval for tx same Also complicated by constipation  Also reviewed chronic medical issues/interval hx:  AF with RVR - difficult mgmt of same, s/p DCCV 09/2010 - briefly off anticoag (pradaxa) due to bleeding complications (hematuria) and low CHADs - but back on anticoag due to LLE DVT 10/2010 - expect lifelong anticoag -No chest pain or palpitations  LLE DVT - dx 10/2010, 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 S/p IVC filter 04/2011 at [facility], LMWH changed to oral 07/2011- complicated by chronic LLE lymphedema, no chest pain or shortness of breath   Cervical cancer, metastatic - dx 04/2003, recurrent dz 2011 - resumed chemo XRT 09/2010 - 03/2011. complicated by renal insuff due to hydro/obstruction in 2005; also s/p RFA 04/2011 for hepatic met - planning XRT fall 2013  Past Medical History  Diagnosis Date  . Diastolic dysfunction   . MIGRAINE HEADACHE   . Atrial fibrillation     failed DCCV, CHADs2-prev pradaxa stopped due to hematuria with ureter issues/JJ   . Anemia   . HTN (hypertension)   . GLAUCOMA   . CARPAL TUNNEL SYNDROME, RIGHT   . Lymphedema     L>R leg from pelvic XRT/scarring  . Hepatic steatosis     CT scan 06/2009, 12/2009  . DVT (deep venous thrombosis) 10/2010    LLE, anticaog resumed  . CHF (congestive heart failure)     1 yr ago per pt  . Dyspnea on exertion   . Arthritis   . GERD (gastroesophageal reflux disease)   .  Lesion of liver   . Small kidney     left  . Difficulty sleeping   . Cervical cancer dx 04/2003, recur 04/2008    s/p debulk (TAHBSO), chemo/XRT; recurrent dz L pelvis dx 2010 and liver met 09/2010 s/p RFA    Review of Systems  Constitutional: Negative for fever, chills, diaphoresis and unexpected weight change.  Cardiovascular: Positive for leg swelling. Negative for chest pain and palpitations.  Genitourinary: Positive for flank pain (left). Negative for dysuria and hematuria.  Musculoskeletal: Negative for joint swelling and gait problem.       Objective:   Physical Exam  BP 112/72  Pulse 88  Temp 98.3 F (36.8 C) (Oral)  Ht 5\' 1"  (1.549 m)  Wt 216 lb 1.9 oz (98.031 kg)  BMI 40.84 kg/m2  SpO2 97% Wt Readings from Last 3 Encounters:  09/28/11 216 lb 1.9 oz (98.031 kg)  09/24/11 219 lb (99.338 kg)  09/06/11 215 lb 1.9 oz (97.578 kg)   Constitutional: She is obese but appears well-developed and well-nourished. No distress. Cardiovascular: Irregular rate and rhythm - normal heart sounds.  No murmur heard.  LLE woody edema, from foot to upper thigh with varicose veins - no RLE edema (fatty ankle) Pulmonary/Chest: Effort normal and breath sounds normal. No respiratory distress. She has no wheezes.  Abd: obese, S, NTND, no reproducible  L flank tenderness MSkel - Back: full range of motion of thoracic and lumbar spine. Non tender to palpation. DTR's are symmetrically intact. Sensation intact in all dermatomes of the lower extremities. Full strength to manual muscle testing. patient is able to heel toe walk without difficulty and ambulates with mildly antalgic gait. Skin - no bruising or redness Psychiatric: She has an approp depressed mood and affect. Her behavior is normal. Judgment and thought content normal.   Lab Results  Component Value Date   WBC 6.4 07/31/2011   HGB 11.5* 07/31/2011   HCT 33.7* 07/31/2011   PLT 195 07/31/2011   ALT 33 07/31/2011   AST 19 07/31/2011   NA 137  07/31/2011   K 4.1 07/31/2011   CL 101 07/31/2011   CREATININE 0.99 07/31/2011   BUN 15 07/31/2011   CO2 22 07/31/2011   TSH 1.700 05/16/2010   INR 0.98 05/07/2011   HGBA1C 5.2 09/19/2008   CT A/P 06/09/11 - increase in retroperitoneal LN size left side, s/p hepatic ablation of met Dg Thoracic Spine 2 View  08/03/2011  *RADIOLOGY REPORT*  Clinical Data: Back pain.  THORACIC SPINE - 2 VIEW  Comparison: None.  Findings: Vertebral body height and alignment are normal. Scattered  anterior and right side endplate spurring is noted.  IMPRESSION: No acute finding.  Degenerative disease.  Original Report Authenticated By: Bernadene Bell. D'ALESSIO, M.D.   Dg Lumbar Spine Complete  08/03/2011  *RADIOLOGY REPORT*  Clinical Data: Chronic left-sided abdominal pain and left lower back pain.  LUMBAR SPINE - COMPLETE 4+ VIEW  Comparison: Bone window images from CT abdomen and pelvis 03/22/2011.  Findings: Five non-rib bearing lumbar vertebrae with anatomic alignment.  No fractures.  Mild disc space narrowing and endplate hypertrophic changes at L3-4, unchanged.  Remaining disc spaces well preserved.  Lower thoracic spondylosis.  No pars defects. Facet degenerative changes at L4-5 and to a much greater degree L5- S1. Visualized sacroiliac joints intact.  Osteopenia suspected. IVC filter.  IMPRESSION: No acute osseous abnormality.  Mild degenerative disc disease and spondylosis at L3-4.  Facet degenerative changes at L5-S1 greater than L4-5.  Osteopenia suspected.  Original Report Authenticated By: Arnell Sieving, M.D.   Nm Pet Image Restag (ps) Skull Base To Thigh  09/15/2011  *RADIOLOGY REPORT*  Clinical Data: Subsequent treatment strategy for cervical carcinoma.  Liver metastasis.  NUCLEAR MEDICINE PET SKULL BASE TO THIGH  Fasting Blood Glucose:  97  Technique:  15.6 mCi F-18 FDG was injected intravenously.   CT data was obtained and used for attenuation correction and anatomic localization only.  (This was not acquired as a  diagnostic CT examination.) Additional exam technical data entered  on technologist worksheet.  Comparison: CT abdomen 42,013, PET CT scanning 03/21/1998 the  Findings:  Neck: No hypermetabolic nodes in the neck.  Chest: No hypermetabolic mediastinal or hilar nodes.  No suspicious pulmonary nodules.  Abdomen / Pelvis:Linear low density lesion in the superior right hepatic lobe at the radioablation site.  No significant hypermetabolic activity associated ablation site.  No new hepatic lesions.  Enlarged left periaortic lymph nodes have associated hypermetabolic activity with SUV max = 6.6 (image 150).  These lymph nodes also increased in size measuring 22 x 19 mm compared to 14 x 16 mm on most recent CT scan (06/09/2011).  There is an IVC filter in place. There is inflammation in the left growing which suggests sequelae of remote deep venous thrombosis.  No hypermetabolic pelvic lymph nodes or sidewall  metastasis. Skeleton:No focal hypermetabolic activity to suggest skeletal metastasis.  IMPRESSION:  1.  Enlarging hypermetabolic left retroperitoneal periaortic lymph nodes are most consistent with metastatic nodal progression. 2.  No hypermetabolic activity associated with liver ablation site. 3.  Likely sequela of left deep venous thrombosis.  Original Report Authenticated By: Genevive Bi, M.D.      Assessment & Plan:  See problem list. Medications and labs reviewed today.  L flank pain - ? due to RP mets/LN increase CT 05/2011 - Change to XR oxy -continue oxycodone prn breakthru + gabapentin - new rx done reviewed follow up onc 08/2011 with repeat PET  follow up rad onc as planned tomorrow - support offered

## 2011-09-28 NOTE — Assessment & Plan Note (Signed)
DCCV 07/2010 - briefly off Pradaxa in 09/2010 Then back on coumadin due LLE DVT 10/2010 Changed to xarelto summer 2013 Continue rate control (failed DCCV in 2012) follow up cards as needed

## 2011-09-28 NOTE — Assessment & Plan Note (Signed)
Chronic persistent with known adenopathy/hx cervical cancer but not thought to be enough to account for the pain mild diverticulosis only on last colonscopy 2011, April 2013 CT abd neg for other acute  recent labs unrevealing Reviewed 08/2011 PET and gyn onc visit late 08/2011 Planning rad onc tx to start late summer Increase pain control as above and continue aggressive mgmt of constipation

## 2011-09-28 NOTE — Assessment & Plan Note (Signed)
Dx 04/2003: s/p chemo/debulk/XRT Recurrent dx 2/2011L pelvic wall and liver met - resumed chemo 09/2010 - 03/05/2011 Multiple complications related to same and renal involvement/obstruction -  DVT LLE 10/2010 and 04/2011 likely related to hypercoagulable state from malignancy - s/p IVC filter 04/2011 Hepatic metastatic lesion RF ablation done 05/07/11 by  interventional radiology  continued L flank discomfort, no LLQ - increase LN size on 05/2011 CT - reviewed again with pt today -  s/p gyn onc and PET 08/2011> increase L RP LN activity - to see rad onc 09/2011 continue oxycodone (change to XR and increase dose) and neurontin 300mg  tid -  continue Miralax to combat constipation - take BID-TID Interval history reviewed in EMR and with patient today

## 2011-09-28 NOTE — Patient Instructions (Signed)
It was good to see you today. We have reviewed the interval events since your last visit here Strt extended release OxyContinu 20mg  every 12h - Continue short acting Percocet as needed for breakthru pain symptoms and continue same dose gabapentin for pain -  Your prescription(s) have been submitted to your pharmacy (or given to you to submit to your pharmacy). Please take as directed and contact our office if you believe you are having problem(s) with the medication(s). Increase Miralax to 2-3x/day for constipation discuss the situation with your other specialists about your PET scan and your pain - let me know now i can help if needed

## 2011-09-29 ENCOUNTER — Encounter: Payer: Self-pay | Admitting: Radiation Oncology

## 2011-09-29 ENCOUNTER — Telehealth: Payer: Self-pay

## 2011-09-29 ENCOUNTER — Ambulatory Visit
Admission: RE | Admit: 2011-09-29 | Discharge: 2011-09-29 | Disposition: A | Discharge status: Home / Self Care | Payer: 59 | Source: Ambulatory Visit | Attending: Radiation Oncology | Admitting: Radiation Oncology

## 2011-09-29 VITALS — BP 168/106 | HR 83 | Temp 98.2°F | Resp 18 | Wt 216.7 lb

## 2011-09-29 DIAGNOSIS — C539 Malignant neoplasm of cervix uteri, unspecified: Secondary | ICD-10-CM

## 2011-09-29 DIAGNOSIS — C772 Secondary and unspecified malignant neoplasm of intra-abdominal lymph nodes: Secondary | ICD-10-CM | POA: Insufficient documentation

## 2011-09-29 DIAGNOSIS — R319 Hematuria, unspecified: Secondary | ICD-10-CM | POA: Insufficient documentation

## 2011-09-29 DIAGNOSIS — N898 Other specified noninflammatory disorders of vagina: Secondary | ICD-10-CM | POA: Insufficient documentation

## 2011-09-29 DIAGNOSIS — Z510 Encounter for antineoplastic radiation therapy: Secondary | ICD-10-CM | POA: Insufficient documentation

## 2011-09-29 DIAGNOSIS — D649 Anemia, unspecified: Secondary | ICD-10-CM | POA: Insufficient documentation

## 2011-09-29 DIAGNOSIS — D684 Acquired coagulation factor deficiency: Secondary | ICD-10-CM | POA: Insufficient documentation

## 2011-09-29 DIAGNOSIS — R112 Nausea with vomiting, unspecified: Secondary | ICD-10-CM | POA: Insufficient documentation

## 2011-09-29 DIAGNOSIS — Z79899 Other long term (current) drug therapy: Secondary | ICD-10-CM | POA: Insufficient documentation

## 2011-09-29 MED ORDER — OXYCODONE HCL 20 MG PO TB12: 20.0000 mg | ORAL_TABLET | Freq: Three times a day (TID) | ORAL | Status: DC

## 2011-09-29 NOTE — Progress Notes (Signed)
HERE TODAY FOR RECONSULT OF CERVICAL CANCER.  HAS CONSTANT PAIN IN LEFT LATERAL ASPECT OF TRUNK, SAYS IT SEEMS TO START IN BACK AND FEELS LIKE IT COMES THROUGH TO THE FRONT.  RATES 10/10 EVEN WITH OXYCONTIN 20MG  Q12 AND PERCOCET FOR BTP.  HAS HAD SOME TROUBLE WITH CONSTIPATION BUT IS TAKING MIRALAX TO HELP WITH THIS. NOT SLEEPING AT NIGHT DUE TO SEVERE PAIN

## 2011-09-29 NOTE — Telephone Encounter (Signed)
Change oxycontin 20 to q8h, continue percocet prn breakthru pain with ibuprofen  If still unimproved by Fri afternoon, call for higher dose

## 2011-09-29 NOTE — Progress Notes (Signed)
Please see the Nurse Progress Note in the MD Initial Consult Encounter for this patient. 

## 2011-09-29 NOTE — Telephone Encounter (Signed)
Notified pt with md response.Marland KitchenMarland Kitchen7/31/13@4 :08pm/LMB

## 2011-09-29 NOTE — Telephone Encounter (Signed)
Pt called stating that she is not having good pain control with Oxycodone prescribed at yesterday's OV. Pt is requesting advisement from MD

## 2011-09-30 ENCOUNTER — Encounter: Payer: Self-pay | Admitting: Radiation Oncology

## 2011-09-30 ENCOUNTER — Ambulatory Visit
Admission: RE | Admit: 2011-09-30 | Discharge: 2011-09-30 | Disposition: A | Discharge status: Home / Self Care | Payer: 59 | Source: Ambulatory Visit | Attending: Radiation Oncology | Admitting: Radiation Oncology

## 2011-09-30 DIAGNOSIS — C539 Malignant neoplasm of cervix uteri, unspecified: Secondary | ICD-10-CM

## 2011-09-30 NOTE — Progress Notes (Signed)
Radiation Oncology         [phone] ________________________________  Name: [name] MRN: [id number]  Date: 09/30/2011  DOB: May 25, 1950  Reevaluation note  CC: [name], MD  [place] *  Diagnosis:   Recurrent cervical cancer  Interval Since Last Radiation:  18 months  Narrative:  The patient returns today for further evaluation and consideration for additional radiation therapy as part of the management of her recurrent cervical cancer. The patient is received previous treatment on 2 separate occasions the patient initially  received radiation directed at the left pelvis using IM RT. This was for unresectable left pelvic wall is recurrence. At a later date the patient recurred in the left external  iliac nodal chain and right inguinal area. She received additional IMRT for this recurrence. Total dose with this retreatment was limited in light of patient's previous radiation therapy.  More recently the patient recurred in the liver and underwent therapy with interventional radiology. For the past several weeks patient's noticed increasing pain in the left flank area. A  repeat PET scan was performed which showed enlarged left periaortic lymph nodes with associated hypermetabolic activity with that S. U V. of 6.6. The largest lymph node measured 2.2 cm. There were no other areas of activity noted on the patient's PET scan.  given the patient's pain and x-ray findings she is now seen in radiation oncology for consideration for additional treatment.                      ALLERGIES:  is allergic to codeine; flecainide acetate; morphine; and propafenone hcl.  Meds: Current Outpatient Prescriptions  Medication Sig Dispense Refill  . diltiazem (CARDIZEM CD) 120 MG 24 hr capsule Take 120 mg by mouth daily before breakfast.      . furosemide (LASIX) 40 MG tablet Take 1 tablet (40 mg total) by mouth as needed.  30 tablet  1  . gabapentin (NEURONTIN) 300 MG capsule Take 1  capsule (300 mg total) by mouth 3 (three) times daily.  90 capsule  5  . ibuprofen (ADVIL,MOTRIN) 200 MG tablet Take 400-600 mg by mouth every 6 (six) hours as needed. Pain      . metoprolol (LOPRESSOR) 50 MG tablet Take 1 tablet (50 mg total) by mouth 2 (two) times daily.  60 tablet  5  . Multiple Vitamin (MULITIVITAMIN WITH MINERALS) TABS Take 1 tablet by mouth daily.      [name] omeprazole (PRILOSEC) 20 MG capsule Take 20 mg by mouth daily.       [name] oxyCODONE (OXYCONTIN) 20 MG 12 hr tablet Take 1 tablet (20 mg total) by mouth every 8 (eight) hours.  90 tablet  0  . oxyCODONE-acetaminophen (PERCOCET) 7.5-325 MG per tablet Take 1 tablet by mouth every 4 (four) hours as needed for pain. breakthru pain  60 tablet  0  . Rivaroxaban (XARELTO) 20 MG TABS Take 20 mg by mouth daily.  30 tablet  5  . sodium chloride (OCEAN) 0.65 % nasal spray Place 1 spray into the nose as needed. Allergies        Physical Findings: The patient is in no acute distress. Patient is alert and oriented.  vitals were not taken for this visit.[name]  No palpable cervical supraclavicular or axillary adenopathy. Lungs clear to auscultation. The heart has a regular rhythm and rate. The abdomen is soft and nontender with normal bowel sounds. On neurological examination the patient has some slight weakness in  her left lower extremity which is been a chronic problem for her.   Lab Findings: Lab Results  Component Value Date   WBC 6.4 07/31/2011   HGB 11.5* 07/31/2011   HCT 33.7* 07/31/2011   MCV 85.3 07/31/2011   PLT 195 07/31/2011    @LASTCHEM @  Radiographic Findings: Nm Pet Image Restag (ps) Skull Base To Thigh  09/15/2011  *RADIOLOGY REPORT*  Clinical Data: Subsequent treatment strategy for cervical carcinoma.  Liver metastasis.  NUCLEAR MEDICINE PET SKULL BASE TO THIGH  Fasting Blood Glucose:  97  Technique:  15.6 mCi F-18 FDG was injected intravenously.   CT data was obtained and used for attenuation correction and anatomic localization  only.  (This was not acquired as a diagnostic CT examination.) Additional exam technical data entered  on technologist worksheet.  Comparison: CT abdomen 42,013, PET CT scanning 03/21/1998 the  Findings:  Neck: No hypermetabolic nodes in the neck.  Chest: No hypermetabolic mediastinal or hilar nodes.  No suspicious pulmonary nodules.  Abdomen / Pelvis:Linear low density lesion in the superior right hepatic lobe at the radioablation site.  No significant hypermetabolic activity associated ablation site.  No new hepatic lesions.  Enlarged left periaortic lymph nodes have associated hypermetabolic activity with SUV max = 6.6 (image 150).  These lymph nodes also increased in size measuring 22 x 19 mm compared to 14 x 16 mm on most recent CT scan (06/09/2011).  There is an IVC filter in place. There is inflammation in the left growing which suggests sequelae of remote deep venous thrombosis.  No hypermetabolic pelvic lymph nodes or sidewall metastasis. Skeleton:No focal hypermetabolic activity to suggest skeletal metastasis.  IMPRESSION:  1.  Enlarging hypermetabolic left retroperitoneal periaortic lymph nodes are most consistent with metastatic nodal progression. 2.  No hypermetabolic activity associated with liver ablation site. 3.  Likely sequela of left deep venous thrombosis.  Original Report Authenticated By: Genevive Bi, M.D.    Impression:  recurrent cervical cancer.  As above the patient has isolated recurrence in the periaortic area. This is likely causing her pain. Given the isolated involvement, the patient would be a candidate for additional radiation therapy. This appears to be above where her previous radiation therapy was delivered.  Plan:   simulation and planning on August 1. Patient will receive approximately 4500 cGy to the area of recurrence depending on the dose constraints with the left kidney.   _____________________________________    Billie Lade, PhD, MD

## 2011-09-30 NOTE — Progress Notes (Addendum)
  Radiation Oncology         [phone] ________________________________  Name: [name] MRN: [id number]  Date: 09/30/2011  DOB: [birth year]  SIMULATION AND TREATMENT PLANNING NOTE  DIAGNOSIS:  Recurrent cervical cancer  NARRATIVE:  The patient was brought to the CT Simulation planning suite.  Identity was confirmed.  All relevant records and images related to the planned course of therapy were reviewed.  The patient freely provided informed written consent to proceed with treatment after reviewing the details related to the planned course of therapy. The consent form was witnessed and verified by the simulation staff.  Then, the patient was set-up in a stable reproducible  supine position for radiation therapy.  CT images were obtained.  Surface markings were placed.  The CT images were loaded into the planning software.  Then the target and avoidance structures were contoured.  Treatment planning then occurred.  The radiation prescription was entered and confirmed.  A total of 1 complex treatment devices were fabricated. I have requested : Intensity Modulated Radiotherapy (IMRT) is medically necessary for this case for the following reason:  Small bowel sparing and close proximity to left kidney.  PLAN:  The patient will receive 50.4 Gy in 28 fractions.  ________________________________   Billie Lade, PhD, MD

## 2011-10-04 ENCOUNTER — Other Ambulatory Visit: Payer: Self-pay | Admitting: *Deleted

## 2011-10-04 DIAGNOSIS — C539 Malignant neoplasm of cervix uteri, unspecified: Secondary | ICD-10-CM

## 2011-10-05 ENCOUNTER — Emergency Department (HOSPITAL_COMMUNITY)
Admission: EM | Admit: 2011-10-05 | Discharge: 2011-10-05 | Disposition: A | Discharge status: Home / Self Care | Payer: 59 | Attending: Emergency Medicine | Admitting: Emergency Medicine

## 2011-10-05 ENCOUNTER — Emergency Department (HOSPITAL_COMMUNITY): Payer: 59

## 2011-10-05 ENCOUNTER — Encounter (HOSPITAL_COMMUNITY): Payer: Self-pay

## 2011-10-05 DIAGNOSIS — Z86718 Personal history of other venous thrombosis and embolism: Secondary | ICD-10-CM | POA: Insufficient documentation

## 2011-10-05 DIAGNOSIS — Z8739 Personal history of other diseases of the musculoskeletal system and connective tissue: Secondary | ICD-10-CM | POA: Insufficient documentation

## 2011-10-05 DIAGNOSIS — Z9089 Acquired absence of other organs: Secondary | ICD-10-CM | POA: Insufficient documentation

## 2011-10-05 DIAGNOSIS — Z87891 Personal history of nicotine dependence: Secondary | ICD-10-CM | POA: Insufficient documentation

## 2011-10-05 DIAGNOSIS — Z79899 Other long term (current) drug therapy: Secondary | ICD-10-CM | POA: Insufficient documentation

## 2011-10-05 DIAGNOSIS — R079 Chest pain, unspecified: Secondary | ICD-10-CM | POA: Insufficient documentation

## 2011-10-05 DIAGNOSIS — K219 Gastro-esophageal reflux disease without esophagitis: Secondary | ICD-10-CM | POA: Insufficient documentation

## 2011-10-05 DIAGNOSIS — M549 Dorsalgia, unspecified: Secondary | ICD-10-CM | POA: Insufficient documentation

## 2011-10-05 DIAGNOSIS — I4891 Unspecified atrial fibrillation: Secondary | ICD-10-CM | POA: Insufficient documentation

## 2011-10-05 DIAGNOSIS — I10 Essential (primary) hypertension: Secondary | ICD-10-CM | POA: Insufficient documentation

## 2011-10-05 DIAGNOSIS — I509 Heart failure, unspecified: Secondary | ICD-10-CM | POA: Insufficient documentation

## 2011-10-05 LAB — POCT I-STAT, CHEM 8
Calcium, Ion: 1.34 mmol/L — ABNORMAL HIGH (ref 1.12–1.23)
HCT: 37 % (ref 36.0–46.0)
Hemoglobin: 12.6 g/dL (ref 12.0–15.0)
TCO2: 22 mmol/L (ref 0–100)

## 2011-10-05 LAB — CBC WITH DIFFERENTIAL/PLATELET
Basophils Relative: 1 % (ref 0–1)
Eosinophils Absolute: 0.1 10*3/uL (ref 0.0–0.7)
Eosinophils Relative: 2 % (ref 0–5)
MCH: 29.2 pg (ref 26.0–34.0)
MCHC: 33.2 g/dL (ref 30.0–36.0)
MCV: 87.9 fL (ref 78.0–100.0)
Neutrophils Relative %: 68 % (ref 43–77)
Platelets: 193 10*3/uL (ref 150–400)
RDW: 14.4 % (ref 11.5–15.5)

## 2011-10-05 LAB — TROPONIN I: Troponin I: <0.3 ng/mL (ref <0.30)

## 2011-10-05 LAB — HEPATIC FUNCTION PANEL
Alkaline Phosphatase: 183 U/L — ABNORMAL HIGH (ref 39–117)
Indirect Bilirubin: 0.2 mg/dL — ABNORMAL LOW (ref 0.3–0.9)
Total Bilirubin: 0.3 mg/dL (ref 0.3–1.2)

## 2011-10-05 LAB — POCT I-STAT TROPONIN I: Troponin i, poc: 0 ng/mL (ref 0.00–0.08)

## 2011-10-05 MED ORDER — OXYCODONE HCL 20 MG PO TB12: 40.0000 mg | ORAL_TABLET | Freq: Three times a day (TID) | ORAL | Status: DC

## 2011-10-05 MED ORDER — ASPIRIN 81 MG PO CHEW
324.0000 mg | CHEWABLE_TABLET | Freq: Once | ORAL | Status: AC
  Administered 2011-10-05: 324 mg via ORAL
  Filled 2011-10-05: qty 4

## 2011-10-05 MED ORDER — IOHEXOL 350 MG/ML SOLN
100.0000 mL | Freq: Once | INTRAVENOUS | Status: AC | PRN
  Administered 2011-10-05: 100 mL via INTRAVENOUS

## 2011-10-05 MED ORDER — HYDROMORPHONE HCL PF 2 MG/ML IJ SOLN
2.0000 mg | Freq: Once | INTRAMUSCULAR | Status: AC
  Administered 2011-10-05: 2 mg via INTRAVENOUS
  Filled 2011-10-05: qty 1

## 2011-10-05 MED ORDER — OXYCODONE HCL 40 MG PO TB12: 40.0000 mg | ORAL_TABLET | Freq: Three times a day (TID) | ORAL | Status: DC

## 2011-10-05 MED ORDER — HYDROMORPHONE HCL PF 2 MG/ML IJ SOLN
2.0000 mg | INTRAMUSCULAR | Status: AC | PRN
  Administered 2011-10-05 (×2): 2 mg via INTRAVENOUS
  Filled 2011-10-05 (×2): qty 1

## 2011-10-05 NOTE — ED Notes (Signed)
Pt requested to sit on side of bed, one side of railing is up. Pt requested pain med due to pain

## 2011-10-05 NOTE — ED Notes (Signed)
Pt here for chest pain ongoing sharp in nature, sts ongoing for hours now, now worse, pt sts nauseated, weak and diaphoretic on onset.

## 2011-10-05 NOTE — ED Notes (Signed)
Patient up walking around the room getting dressed  Stated she just wanted to go home and take her home meds

## 2011-10-05 NOTE — ED Provider Notes (Signed)
History     CSN: [id number]  Arrival date & time 10/05/11  4098   First MD Initiated Contact with Patient 10/05/11 1135      Chief Complaint  Patient presents with  . Chest Pain    (Consider location/radiation/quality/duration/timing/severity/associated sxs/prior treatment) Patient is a 61 y.o. female presenting with chest pain. The history is provided by the patient.  Chest Pain The chest pain began 12 - 24 hours ago. Chest pain occurs constantly. The chest pain is worsening. The severity of the pain is severe. The quality of the pain is described as sharp and pleuritic. The pain radiates to the upper back. Pertinent negatives for primary symptoms include no fever, no shortness of breath and no cough.  Her past medical history is significant for arrhythmia and cancer.  Pertinent negatives for past medical history include no CAD and no DVT.  Procedure history is positive for echocardiogram.     Past Medical History  Diagnosis Date  . Diastolic dysfunction   . MIGRAINE HEADACHE   . Atrial fibrillation     failed DCCV, CHADs2-prev pradaxa stopped due to hematuria with ureter issues/JJ   . Anemia   . HTN (hypertension)   . GLAUCOMA   . CARPAL TUNNEL SYNDROME, RIGHT   . Lymphedema     L>R leg from pelvic XRT/scarring  . Hepatic steatosis     CT scan 06/2009, 12/2009  . DVT (deep venous thrombosis) 10/2010    LLE, anticaog resumed  . CHF (congestive heart failure)     1 yr ago per pt  . Dyspnea on exertion   . Arthritis   . GERD (gastroesophageal reflux disease)   . Lesion of liver   . Small kidney     left  . Difficulty sleeping   . Cervical cancer dx 04/2003, recur 04/2008    s/p debulk (TAHBSO), chemo/XRT; recurrent dz L pelvis dx 2010 and liver met 09/2010 s/p RFA    Past Surgical History  Procedure Date  . Total abdominal hysterectomy   . Cholecystectomy   . Ureteral stent placement 2005, 01/2010, 07/2010    L hydro related to cervical ca and XRT  . Oophorectomy  2005  . Cardioversion   . Tonsillectomy     Family History  Problem Relation Age of Onset  . Lung cancer    . Hypertension    . Heart disease    . Stroke    . Asthma    . Asthma Son     History  Substance Use Topics  . Smoking status: Former Smoker -- 0.5 packs/day for 3 years    Quit date: 03/01/1984  . Smokeless tobacco: Never Used  . Alcohol Use: No    OB History    Grav Para Term Preterm Abortions TAB SAB Ect Mult Living                  Review of Systems  Constitutional: Negative for fever.  Respiratory: Negative for cough and shortness of breath.   Cardiovascular: Positive for chest pain.  Musculoskeletal: Positive for back pain.  All other systems reviewed and are negative.    Allergies  Codeine; Flecainide acetate; Morphine; and Propafenone hcl  Home Medications   Current Outpatient Rx  Name Route Sig Dispense Refill  . DILTIAZEM HCL ER COATED BEADS 120 MG PO CP24 Oral Take 120 mg by mouth daily before breakfast.    . GABAPENTIN 300 MG PO CAPS Oral Take 1 capsule (300 mg total) by mouth  3 (three) times daily. 90 capsule 5  . IBUPROFEN 200 MG PO TABS Oral Take 400-600 mg by mouth every 6 (six) hours as needed. Pain    . METOPROLOL TARTRATE 50 MG PO TABS Oral Take 1 tablet (50 mg total) by mouth 2 (two) times daily. 60 tablet 5  . ADULT MULTIVITAMIN W/MINERALS CH Oral Take 1 tablet by mouth daily.    [name] OMEPRAZOLE 20 MG PO CPDR Oral Take 20 mg by mouth daily.     . OXYCODONE HCL ER 20 MG PO TB12 Oral Take 1 tablet (20 mg total) by mouth every 8 (eight) hours. 90 tablet 0  . OXYCODONE-ACETAMINOPHEN 7.5-325 MG PO TABS Oral Take 1 tablet by mouth every 4 (four) hours as needed for pain. breakthru pain 60 tablet 0  . RIVAROXABAN 20 MG PO TABS Oral Take 20 mg by mouth daily. 30 tablet 5  . SALINE NASAL SPRAY 0.65 % NA SOLN Nasal Place 1 spray into the nose as needed. Allergies      BP 168/103  Pulse 100  Temp 97.9 F (36.6 C) (Oral)  Resp 18  SpO2  98%  Physical Exam  Nursing note and vitals reviewed. Constitutional: She appears well-developed and well-nourished. No distress.  HENT:  Head: Normocephalic and atraumatic.  Right Ear: External ear normal.  Left Ear: External ear normal.  Eyes: Conjunctivae are normal. Right eye exhibits no discharge. Left eye exhibits no discharge. No scleral icterus.  Neck: Neck supple. No tracheal deviation present.  Cardiovascular: Normal rate, regular rhythm and intact distal pulses.   Pulmonary/Chest: Effort normal and breath sounds normal. No stridor. No respiratory distress. She has no wheezes. She has no rales. She exhibits tenderness.  Abdominal: Soft. Bowel sounds are normal. She exhibits no distension. There is tenderness (mild ruq). There is no rebound and no guarding.  Musculoskeletal: She exhibits no edema and no tenderness.  Neurological: She is alert. She has normal strength. No sensory deficit. Cranial nerve deficit:  no gross defecits noted. She exhibits normal muscle tone. She displays no seizure activity. Coordination normal.  Skin: Skin is warm and dry. No rash noted.  Psychiatric: She has a normal mood and affect.    ED Course  Procedures (including critical care time)  Rate: 84  Rhythm: a fib  QRS Axis: normal  Intervals: normal  ST/T Wave abnormalities: normal  Conduction Disutrbances:none  Narrative Interpretation: hx of a fib, no new onset  Old EKG Reviewed: unchanged  Labs Reviewed  POCT I-STAT, CHEM 8 - Abnormal; Notable for the following:    Glucose, Bld 103 (*)     Calcium, Ion 1.34 (*)     All other components within normal limits  HEPATIC FUNCTION PANEL - Abnormal; Notable for the following:    ALT 72 (*)     Alkaline Phosphatase 183 (*)     Indirect Bilirubin 0.2 (*)     All other components within normal limits  CBC WITH DIFFERENTIAL  POCT I-STAT TROPONIN I  LIPASE, BLOOD  TROPONIN I   Dg Chest 2 View  10/05/2011  *RADIOLOGY REPORT*  Clinical Data:  Chest pain.  History of cervical cancer.  The  CHEST - 2 VIEW  Comparison: 05/15/2010  Findings: Mild cardiomegaly.  Linear atelectasis or scarring in the lingula.  Right lung is clear.  No effusions or acute bony abnormality.  IMPRESSION: Cardiomegaly.  Lingular atelectasis or scarring.  Original Report Authenticated By: Cyndie Chime, M.D.   Ct Angio Chest Pe W/cm &/  or Wo Cm  10/05/2011  *RADIOLOGY REPORT*  Clinical Data: Chest pain, shortness of breath.  CT ANGIOGRAPHY CHEST  Technique:  Multidetector CT imaging of the chest using the standard protocol during bolus administration of intravenous contrast. Multiplanar reconstructed images including MIPs were obtained and reviewed to evaluate the vascular anatomy.  Contrast: OMNIPAQUE IOHEXOL 350 MG/ML SOLN  Comparison: PET CT 09/15/2011  Findings: No filling defects in the pulmonary arteries to suggest pulmonary emboli.  Heart is mildly enlarged.  Aorta is normal caliber.  Borderline sized right paratracheal lymph nodes, stable since prior PET CT when these were not shown to be metabolically active.  No hilar or axillary adenopathy.  Small hiatal hernia.  Mild vascular congestion within the lungs without overt edema.  Linear areas of atelectasis in the lower lungs bilaterally.  No pleural or pericardial effusion.  Small low-density lesion noted within the right thyroid lobe, stable since prior PET CT.  No acute bony abnormality. Degenerative changes in the thoracic spine.  Imaging into the upper abdomen shows no acute findings.  There is a low density area within the dome of the liver, stable since prior PET CT, likely ablation defect.  IMPRESSION: No evidence of pulmonary embolus.  Cardiomegaly, vascular congestion.  Stable borderline sized right paratracheal lymph nodes.  Low-density area within the liver, stable since prior PET CT, likely related to defect from prior ablation.  Original Report Authenticated By: Cyndie Chime, M.D.     1. Chest pain        MDM  Pt has sharp pleuritic chest pain.  CT scan does not show PE or dissection.  Will check one more cardiac enzyme.  IF negative will plan on discharge with close outpt follow up.  Mild increase in LFTs noted but this appears to be chronic.  Two sets of cardiac enzymes negative.  Will dc home to follow up with pcp.  At this time there does not appear to be any evidence of an acute emergency medical condition and the patient appears stable for discharge with appropriate outpatient follow up.         Celene Kras, MD 10/05/11 [phone]

## 2011-10-05 NOTE — ED Notes (Signed)
Patient transported from CT 

## 2011-10-05 NOTE — ED Notes (Signed)
Family at bedside. 

## 2011-10-05 NOTE — ED Notes (Signed)
Patient continues to state "in to much pain to go home".  Rates pain as 9/10 under left arm.   MD notified that patient does not feel she is able to go home.

## 2011-10-05 NOTE — ED Notes (Signed)
Dr. Lynelle Doctor notified of pt's pain and request

## 2011-10-07 ENCOUNTER — Ambulatory Visit
Admission: RE | Admit: 2011-10-07 | Discharge: 2011-10-07 | Disposition: A | Discharge status: Home / Self Care | Payer: 59 | Source: Ambulatory Visit | Attending: Interventional Radiology | Admitting: Interventional Radiology

## 2011-10-07 DIAGNOSIS — C787 Secondary malignant neoplasm of liver and intrahepatic bile duct: Secondary | ICD-10-CM

## 2011-10-07 DIAGNOSIS — C539 Malignant neoplasm of cervix uteri, unspecified: Secondary | ICD-10-CM

## 2011-10-07 NOTE — Progress Notes (Signed)
Stone ID: [name], female   DOB: 1950/03/16, 61 y.o.   MRN: [id number]  ESTABLISHED Stone OFFICE VISIT  Chief Complaint: Status post percutaneous microwave ablation of [name] liver on 05/07/2011 to treat metastatic cervical carcinoma.  History: [name] returns for follow-up. She had a post- ablation CT performed on 06/09/2011 followed by PET scan on 09/15/2011. This now demonstrates enlarging left retroperitoneal para-aortic adenopathy demonstrating hypermetabolic activity by PET scan. She is scheduled to begin radiation therapy by Dr. Roselind Messier on Monday with 25 treatments scheduled over 5 weeks.  Recently, [name] experienced acute left-sided chest pain under [name] breast and lower rib area. She was evaluated in [name] emergency room with CTA on 10/05/2011 demonstrating no evidence of pulmonary embolism or visible metastatic disease. No pulmonary infiltrates were identified.  [name] remains off of chemotherapy. She is now taking Xarelto for anticoagulation therapy due to recurrent left lower extremity DVT. After recurrent DVT in March following ablation, edema has decreased significantly in [name] left leg and she is now able to walk without difficulty and has significantly improved mobility of [name] extremity.  Review of Systems: No abdominal pain, fever or chills. Some persistent periodic left-sided chest pain. No shortness of breath or cough.  Exam: Vital signs: Blood pressure 132/75, pulse 102, respirations 15, temperature 97.8, oxygen saturation 98% on room air. Abdomen: Soft and nontender. Extremities: Chronic left lower extremity edema.  Labs: Recent labs on 10/05/2011. Total bilirubin 0.3, alkaline phosphatase 183, AST 36, ALT 72, albumin 4.2, total protein 7.7, WBC 4.8, hemoglobin 12.3, hematocrit 37, platelet count 193, creatinine 1.0, BUN 19.  Imaging: I personally reviewed [name] recent PET scan dated 09/15/2011. This shows no evidence of recurrent  hypermetabolic activity at [name] site of percutaneous ablation in [name] dome of [name] right lobe of [name] liver. No new liver metastases are identified. Several hypermetabolic left periaortic retroperitoneal lymph nodes are present which have enlarged and demonstrate abnormal metabolic activity.  Assessment and Plan: No evidence of recurrent metastatic disease at [name] site of percutaneous microwave ablation of a solitary liver metastasis in March. [name] is scheduled to now undergo radiation therapy for metastatic disease in retroperitoneal lymph nodes. I told [name] that I will keep up with any further restaging imaging studies performed after radiation therapy and meet with her at appropriate intervals to review them.

## 2011-10-11 ENCOUNTER — Ambulatory Visit
Admission: RE | Admit: 2011-10-11 | Discharge: 2011-10-11 | Disposition: A | Discharge status: Home / Self Care | Payer: 59 | Source: Ambulatory Visit | Attending: Radiation Oncology | Admitting: Radiation Oncology

## 2011-10-11 ENCOUNTER — Encounter (HOSPITAL_COMMUNITY)
Admission: RE | Admit: 2011-10-11 | Discharge: 2011-10-11 | Disposition: A | Discharge status: Home / Self Care | Payer: 59 | Source: Ambulatory Visit | Attending: Radiation Oncology | Admitting: Radiation Oncology

## 2011-10-11 DIAGNOSIS — N269 Renal sclerosis, unspecified: Secondary | ICD-10-CM | POA: Insufficient documentation

## 2011-10-11 DIAGNOSIS — C772 Secondary and unspecified malignant neoplasm of intra-abdominal lymph nodes: Secondary | ICD-10-CM

## 2011-10-11 DIAGNOSIS — C539 Malignant neoplasm of cervix uteri, unspecified: Secondary | ICD-10-CM

## 2011-10-11 DIAGNOSIS — N289 Disorder of kidney and ureter, unspecified: Secondary | ICD-10-CM | POA: Insufficient documentation

## 2011-10-11 MED ORDER — FUROSEMIDE 10 MG/ML IJ SOLN
40.0000 mg | Freq: Once | INTRAMUSCULAR | Status: DC
  Filled 2011-10-11: qty 4

## 2011-10-11 MED ORDER — TECHNETIUM TC 99M MERTIATIDE
15.9000 | Freq: Once | INTRAVENOUS | Status: AC | PRN
  Administered 2011-10-11: 15.9 via INTRAVENOUS

## 2011-10-11 NOTE — Progress Notes (Signed)
   Department of Radiation Oncology  Phone:  [phone] Fax:        [phone]   IMRT device note  Today the patient began her intensity modulated radiation therapy directed at the site of recurrence in the periaortic area. The patient will be treated with helical intensity modulated radiation therapy. 7.6 sinograms segments will be used to deliver the patient's treatments. This constitutes 1 IMRT device.  -----------------------------------  Billie Lade, PhD, MD

## 2011-10-12 ENCOUNTER — Ambulatory Visit
Admission: RE | Admit: 2011-10-12 | Discharge: 2011-10-12 | Disposition: A | Discharge status: Home / Self Care | Payer: 59 | Source: Ambulatory Visit | Attending: Radiation Oncology | Admitting: Radiation Oncology

## 2011-10-12 DIAGNOSIS — C772 Secondary and unspecified malignant neoplasm of intra-abdominal lymph nodes: Secondary | ICD-10-CM

## 2011-10-12 MED ORDER — RADIAPLEXRX EX GEL
Freq: Once | CUTANEOUS | Status: AC
  Administered 2011-10-12: 1 via TOPICAL

## 2011-10-12 NOTE — Addendum Note (Signed)
Encounter addended by: Delynn Flavin, RN on: 10/12/2011  6:33 PM<BR>     Documentation filed: Visit Diagnoses, Orders, Inpatient [facility]

## 2011-10-12 NOTE — Progress Notes (Signed)
2nd fraction to paraaortic region.  Worried during treatment because she felt the urge to have BM during treatment and is afraid of incontinence. She stakes Miralax, daily in the am, so suggested she take Miralax daily following each treatment, which she is contemplating at this time.  Denies any pain presently.    Education regarding skin changes that may occur in the abdominal region during treatment and given radiaplex gel to use BID, daily after treatment and at bedtime.  Also reviewed pain management and encouraged her to inform physician/nurses if pain worsens during the treatment phase.  Reviewed management with anti-emetics and dietary changes to bland foods with avoidance of caffeine.  Fatigue management addressed. Radiation Therapy and You booklet given with pages marked regarding above education.  Reviewed daily schedule and that Dr. Roselind Messier reviews all patients on Tuesday following treatment but if changes in his schedule that she will be notified in advance.

## 2011-10-12 NOTE — Progress Notes (Signed)
[facility]    Radiation Oncology [address] [place]     Maryln Gottron, M.D. [place], [place] [address]               Billie Lade, M.D., Ph.D. Phone: [phone]      Molli Hazard A. Kathrynn Running, M.D. Fax: [phone]      Radene Gunning, M.D., Ph.D.         Lurline Hare, M.D.         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  Current Dose: 3.6 Gy  Current Fraction: 2  Planned Dose: 50.4 Gy  Narrative: [name] was seen today for weekly treatment management. The chart was checked and MVCT  were reviewed. She did notice some mild queasiness after her first radiation treatment. I recommend she take her nausea medicine 1-2 hours before her treatment if this continues to be an issue.  She continues to have significant pain in the left upper quadrant radiating into the left flank area.  Last week the patient did present with pain in the left chest region. This was  severe enough for her to present to the emergency room. A pulmonary embolus was ruled out.. There is no obvious cardiac issue either.  She has had no further pain in this area.  Codeine; Flecainide acetate; Morphine; and Propafenone hcl Current Outpatient Prescriptions  Medication Sig Dispense Refill  . diltiazem (CARDIZEM CD) 120 MG 24 hr capsule Take 120 mg by mouth daily before breakfast.      . gabapentin (NEURONTIN) 300 MG capsule Take 1 capsule (300 mg total) by mouth 3 (three) times daily.  90 capsule  5  . ibuprofen (ADVIL,MOTRIN) 200 MG tablet Take 400-600 mg by mouth every 6 (six) hours as needed. Pain      . metoprolol (LOPRESSOR) 50 MG tablet Take 1 tablet (50 mg total) by mouth 2 (two) times daily.  60  tablet  5  . Multiple Vitamin (MULITIVITAMIN WITH MINERALS) TABS Take 1 tablet by mouth daily.      [name] omeprazole (PRILOSEC) 20 MG capsule Take 20 mg by mouth daily.       [name] oxyCODONE (OXYCONTIN) 40 MG 12 hr tablet Take 1 tablet (40 mg total) by mouth 3 (three) times daily.  30 tablet  0  . oxyCODONE-acetaminophen (PERCOCET) 7.5-325 MG per tablet Take 1 tablet by mouth every 4 (four) hours as needed for pain. breakthru pain  60 tablet  0  . Rivaroxaban (XARELTO) 20 MG TABS Take 20 mg by mouth daily.  30 tablet  5  . sodium chloride (OCEAN) 0.65 % nasal spray Place 1 spray into the nose as needed. Allergies       No current facility-administered medications for this encounter.   Facility-Administered Medications Ordered in Other Encounters  Medication Dose Route Frequency Provider Last Rate Last Dose  . furosemide (LASIX) injection 40 mg  40 mg Intravenous Once Medication Radiologist, MD       Labs:  Lab Results  Component Value Date   WBC 4.8 10/05/2011   HGB 12.6 10/05/2011   HCT 37.0 10/05/2011  MCV 87.9 10/05/2011   PLT 193 10/05/2011   Lab Results  Component Value Date   CREATININE 1.00 10/05/2011   BUN 19 10/05/2011   NA 140 10/05/2011   K 4.3 10/05/2011   CL 108 10/05/2011   CO2 22 07/31/2011   Lab Results  Component Value Date   ALT 72* 10/05/2011   AST 36 10/05/2011   BILITOT 0.3 10/05/2011    Physical Examination:  vitals were not taken for this visit.   Wt Readings from Last 3 Encounters:  10/07/11 215 lb (97.523 kg)  09/29/11 216 lb 11.2 oz (98.294 kg)  09/28/11 216 lb 1.9 oz (98.031 kg)     Lungs - Normal respiratory effort, chest expands symmetrically. Lungs are clear to auscultation, no crackles or wheezes.  Heart has regular rhythm and rate  Abdomen is soft and non tender with normal bowel sounds  Assessment:  Patient tolerating treatments well except for issues as above.  Plan: Continue treatment per original radiation prescription

## 2011-10-12 NOTE — Addendum Note (Signed)
Encounter addended by: Delynn Flavin, RN on: 10/12/2011  6:30 PM<BR>     Documentation filed: Inpatient Patient Education, Notes Section

## 2011-10-13 ENCOUNTER — Ambulatory Visit
Admission: RE | Admit: 2011-10-13 | Discharge: 2011-10-13 | Disposition: A | Discharge status: Home / Self Care | Payer: 59 | Source: Ambulatory Visit | Attending: Radiation Oncology | Admitting: Radiation Oncology

## 2011-10-14 ENCOUNTER — Ambulatory Visit
Admission: RE | Admit: 2011-10-14 | Discharge: 2011-10-14 | Disposition: A | Discharge status: Home / Self Care | Payer: 59 | Source: Ambulatory Visit | Attending: Radiation Oncology | Admitting: Radiation Oncology

## 2011-10-15 ENCOUNTER — Ambulatory Visit
Admission: RE | Admit: 2011-10-15 | Discharge: 2011-10-15 | Disposition: A | Discharge status: Home / Self Care | Payer: 59 | Source: Ambulatory Visit | Attending: Radiation Oncology | Admitting: Radiation Oncology

## 2011-10-18 ENCOUNTER — Ambulatory Visit
Admission: RE | Admit: 2011-10-18 | Discharge: 2011-10-18 | Disposition: A | Discharge status: Home / Self Care | Payer: 59 | Source: Ambulatory Visit | Admitting: Radiation Oncology

## 2011-10-18 ENCOUNTER — Encounter: Payer: Self-pay | Admitting: Radiation Oncology

## 2011-10-18 ENCOUNTER — Ambulatory Visit
Admission: RE | Admit: 2011-10-18 | Discharge: 2011-10-18 | Disposition: A | Discharge status: Home / Self Care | Payer: 59 | Source: Ambulatory Visit | Attending: Radiation Oncology | Admitting: Radiation Oncology

## 2011-10-18 VITALS — BP 133/90 | HR 90 | Resp 18 | Wt 217.0 lb

## 2011-10-18 DIAGNOSIS — C772 Secondary and unspecified malignant neoplasm of intra-abdominal lymph nodes: Secondary | ICD-10-CM

## 2011-10-18 DIAGNOSIS — C539 Malignant neoplasm of cervix uteri, unspecified: Secondary | ICD-10-CM

## 2011-10-18 MED ORDER — OXYCODONE HCL 40 MG PO TB12: 40.0000 mg | ORAL_TABLET | Freq: Three times a day (TID) | ORAL | Status: DC

## 2011-10-18 MED ORDER — OXYCODONE-ACETAMINOPHEN 7.5-325 MG PO TABS: 1.0000 | ORAL_TABLET | ORAL | Status: DC | PRN

## 2011-10-18 NOTE — Progress Notes (Signed)
Patient presents to the clinic today accompanied by her friend for a PUT with Dr. Roselind Messier. Patient is alert and oriented to person, place, and time. No distress noted. Steady gait noted. Pleasant affect noted. Patient reports left sided abdominal pain 6 on a scale of 0-10 for which she take oxycontin 40 mg and percocet for break thru. Patient reports abdominal pain radiates to back. Patient reports she is occasional incontinent of stool. Also, patient reports taking Imodium to relieve diarrhea. Patient denies skin changes. Patient reports applying Radiaplex to treatment area as directed. Patient reports occaional nausea for which zofran and ativan control. Reported all findings to Dr. Roselind Messier.

## 2011-10-18 NOTE — Progress Notes (Signed)
[facility]    Radiation Oncology [address] [place]     Maryln Gottron, M.D. [place], [place] [address]               Billie Lade, M.D., Ph.D. Phone: [phone]      Molli Hazard A. Kathrynn Running, M.D. Fax: [phone]      Radene Gunning, M.D., Ph.D.         Lurline Hare, M.D.         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 A diagnosis of Cervical cancer was also pertinent to this visit.  Current Dose: 10.8 Gy  Current Fraction: 6  Planned Dose: 50.4  Narrative: [name] was seen today for weekly treatment management. The chart was checked and MVCT  were reviewed.  The patient is now having problems with loose bowels and diarrhea. She's actually had some mild fecal incontinence related to this issue. She has stopped taking her MiraLax at this time. She is about to run out of her OxyContin and oxycodone and I did refill these medications today. I have suggested she take Imodium if she continues to have problems with loose bowels and diarrhea. This is somewhat early to see this during the course of her treatment. The patient was feeling poorly today and did have her sister drive  her in from the [place] area. In addition the patient has had some nausea and is taking medication for this issue.  She has been unable to work the past couple of days.  Codeine; Flecainide acetate; Morphine; and Propafenone hcl  Current Outpatient Prescriptions  Medication Sig Dispense Refill  . diltiazem (CARDIZEM CD) 120 MG 24 hr capsule Take 120 mg by mouth daily before breakfast.      . gabapentin (NEURONTIN) 300 MG capsule Take 1 capsule (300 mg total) by mouth 3 (three) times  daily.  90 capsule  5  . hyaluronate sodium (RADIAPLEXRX) GEL Apply 1 application topically 2 (two) times daily.      [name] ibuprofen (ADVIL,MOTRIN) 200 MG tablet Take 400-600 mg by mouth every 6 (six) hours as needed. Pain      . metoprolol (LOPRESSOR) 50 MG tablet Take 1 tablet (50 mg total) by mouth 2 (two) times daily.  60 tablet  5  . Multiple Vitamin (MULITIVITAMIN WITH MINERALS) TABS Take 1 tablet by mouth daily.      [name] omeprazole (PRILOSEC) 20 MG capsule Take 20 mg by mouth daily.       [name] oxyCODONE (OXYCONTIN) 40 MG 12 hr tablet Take 1 tablet (40 mg total) by mouth 3 (three) times daily.  45 tablet  0  . oxyCODONE-acetaminophen (PERCOCET) 7.5-325 MG per tablet Take 1 tablet by mouth every 4 (four) hours as needed for pain. breakthru pain  60 tablet  0  . Rivaroxaban (XARELTO) 20 MG TABS Take 20 mg by mouth daily.  30 tablet  5  . sodium chloride (OCEAN) 0.65 % nasal spray Place 1 spray into the nose as needed. Allergies       Labs:  Lab Results  Component Value Date   WBC  4.8 10/05/2011   HGB 12.6 10/05/2011   HCT 37.0 10/05/2011   MCV 87.9 10/05/2011   PLT 193 10/05/2011   Lab Results  Component Value Date   CREATININE 1.00 10/05/2011   BUN 19 10/05/2011   NA 140 10/05/2011   K 4.3 10/05/2011   CL 108 10/05/2011   CO2 22 07/31/2011   Lab Results  Component Value Date   ALT 72* 10/05/2011   AST 36 10/05/2011   BILITOT 0.3 10/05/2011    Physical Examination:  weight is 217 lb (98.431 kg). Her blood pressure is 133/90 and her pulse is 90. Her respiration is 18.    Wt Readings from Last 3 Encounters:  10/18/11 217 lb (98.431 kg)  10/07/11 215 lb (97.523 kg)  09/29/11 216 lb 11.2 oz (98.294 kg)     Lungs - Normal respiratory effort, chest expands symmetrically. Lungs are clear to auscultation, no crackles or wheezes.  Heart has regular rhythm and rate  Abdomen is soft and non tender with normal bowel sounds  Assessment:  Patient is having issues as above with her treatment.. Her pain is  unchanged at this time.  Plan: Continue treatment per original radiation prescription    -----------------------------------  Billie Lade, PhD, MD

## 2011-10-19 ENCOUNTER — Telehealth: Payer: Self-pay | Admitting: *Deleted

## 2011-10-19 ENCOUNTER — Ambulatory Visit
Admission: RE | Admit: 2011-10-19 | Discharge: 2011-10-19 | Disposition: A | Discharge status: Home / Self Care | Payer: 59 | Source: Ambulatory Visit | Attending: Radiation Oncology | Admitting: Radiation Oncology

## 2011-10-19 ENCOUNTER — Ambulatory Visit: Admission: RE | Admit: 2011-10-19 | Payer: 59 | Source: Ambulatory Visit | Admitting: Radiation Oncology

## 2011-10-19 ENCOUNTER — Ambulatory Visit: Payer: 59

## 2011-10-19 ENCOUNTER — Other Ambulatory Visit: Payer: Self-pay | Admitting: Radiation Oncology

## 2011-10-19 DIAGNOSIS — C539 Malignant neoplasm of cervix uteri, unspecified: Secondary | ICD-10-CM

## 2011-10-19 LAB — URINALYSIS, MICROSCOPIC - CHCC
Nitrite: NEGATIVE
Protein: 100 mg/dL
Specific Gravity, Urine: 1.03 (ref 1.003–1.035)

## 2011-10-19 NOTE — Telephone Encounter (Signed)
[name] will arrive today to be assessed by Dr. Roselind Messier for a new presentation of hematuria starting on 10/18/11.  She stated she had a large amount of bleeding yesterday that slowed down toward bedtime, but has now resumed this am.  Requested she arrive today at 1530 to be assessed by Dr. Roselind Messier and that we will obtain a urine specimen. Faith, RTT notified on the Tomotherapy machine.

## 2011-10-19 NOTE — Progress Notes (Signed)
Weekly Management Note Current Dose:10.8 Gy  Projected Dose:50.4 Gy   Narrative:  The patient presents for routine under treatment assessment.  CBCT/MVCT images/Port film x-rays were reviewed.  The chart was checked. Had 1 episode of hematuria yesterday pm and then another this morning. No dysuria, frequency, nocturia. Had history of ureteral involvment and requiring stents (Dr. Margarita Grizzle). Also this periaortic mass is near left ureter.   Physical Findings:  Unchanged  Vitals: There were no vitals filed for this visit. Weight:  Wt Readings from Last 3 Encounters:  10/18/11 217 lb (98.431 kg)  10/07/11 215 lb (97.523 kg)  09/29/11 216 lb 11.2 oz (98.294 kg)   Lab Results  Component Value Date   WBC 4.8 10/05/2011   HGB 12.6 10/05/2011   HCT 37.0 10/05/2011   MCV 87.9 10/05/2011   PLT 193 10/05/2011   Lab Results  Component Value Date   CREATININE 1.00 10/05/2011   BUN 19 10/05/2011   NA 140 10/05/2011   K 4.3 10/05/2011   CL 108 10/05/2011   CO2 22 07/31/2011     Impression:  The patient is tolerating radiation.  Plan:  Continue treatment as planned. Check u/a. Patient will call urology for f/u. Will check with Dr. Roselind Messier and continue to monitor. If hematuria continues, consider scope or may need f/u CBC to monitor anemia.

## 2011-10-20 ENCOUNTER — Ambulatory Visit: Payer: 59

## 2011-10-21 ENCOUNTER — Ambulatory Visit
Admission: RE | Admit: 2011-10-21 | Discharge: 2011-10-21 | Disposition: A | Discharge status: Home / Self Care | Payer: 59 | Source: Ambulatory Visit | Attending: Radiation Oncology | Admitting: Radiation Oncology

## 2011-10-22 ENCOUNTER — Ambulatory Visit
Admission: RE | Admit: 2011-10-22 | Discharge: 2011-10-22 | Disposition: A | Discharge status: Home / Self Care | Payer: 59 | Source: Ambulatory Visit | Attending: Radiation Oncology | Admitting: Radiation Oncology

## 2011-10-22 ENCOUNTER — Telehealth: Payer: Self-pay | Admitting: Radiation Oncology

## 2011-10-22 DIAGNOSIS — C772 Secondary and unspecified malignant neoplasm of intra-abdominal lymph nodes: Secondary | ICD-10-CM

## 2011-10-22 MED ORDER — HYDROMORPHONE HCL PF 4 MG/ML IJ SOLN
1.0000 mg | Freq: Once | INTRAMUSCULAR | Status: AC
  Administered 2011-10-22: 1 mg via INTRAMUSCULAR

## 2011-10-22 NOTE — Addendum Note (Signed)
Encounter addended by: Delynn Flavin, RN on: 10/22/2011  6:37 PM<BR>     Documentation filed: Inpatient MAR

## 2011-10-22 NOTE — Telephone Encounter (Signed)
Pt dropped [employer] papers off to be given to Dr. Roselind Messier for completion.  Ph: [phone] Fx: [phone]  10/29/2011: Papers complete and left at rad machine for her to pick up

## 2011-10-22 NOTE — Progress Notes (Signed)
[name] has been without her Oxycontin 40mg  tabs since 10/15/11. Received fax from her pharmacy that she needed prior authorization and that she was only allowed 62 tabs within a 1 month period of time.  Her original script was for 30 tabs and the new script was for 45 tabs.  Called for authorization starting at 1505pm and did not receive authorization until 1800 after 5 calls that the authorization was approved for the remaining 32 tabs.  [name]. Redinger required medicating with Dilaudid 1 mg Im in the right ventro-gluteal region at 1815 as ordered by Dr. Mitzi Hansen.  Tolerated without difficulty and stated she had received Dilaudid in the ED before and did not have any untoward reactions.  Disposition to car, in [facility] in the presence of her husband.  Picking up script on the way home.

## 2011-10-25 ENCOUNTER — Ambulatory Visit
Admission: RE | Admit: 2011-10-25 | Discharge: 2011-10-25 | Disposition: A | Discharge status: Home / Self Care | Payer: 59 | Source: Ambulatory Visit | Attending: Radiation Oncology | Admitting: Radiation Oncology

## 2011-10-25 ENCOUNTER — Other Ambulatory Visit: Payer: Self-pay | Admitting: Radiation Oncology

## 2011-10-25 ENCOUNTER — Ambulatory Visit: Payer: 59 | Attending: Radiation Oncology

## 2011-10-25 ENCOUNTER — Encounter: Payer: Self-pay | Admitting: Radiation Oncology

## 2011-10-25 ENCOUNTER — Ambulatory Visit: Payer: 59 | Admitting: Internal Medicine

## 2011-10-25 VITALS — BP 121/77 | HR 104 | Temp 97.4°F | Resp 20 | Wt 207.1 lb

## 2011-10-25 DIAGNOSIS — Z0289 Encounter for other administrative examinations: Secondary | ICD-10-CM

## 2011-10-25 DIAGNOSIS — C539 Malignant neoplasm of cervix uteri, unspecified: Secondary | ICD-10-CM

## 2011-10-25 DIAGNOSIS — C772 Secondary and unspecified malignant neoplasm of intra-abdominal lymph nodes: Secondary | ICD-10-CM

## 2011-10-25 LAB — URINALYSIS, MICROSCOPIC - CHCC
Bilirubin (Urine): NEGATIVE
Specific Gravity, Urine: 1.03 (ref 1.003–1.035)
pH: 6 (ref 4.6–8.0)

## 2011-10-25 NOTE — Progress Notes (Signed)
Patient alert,oriented,pale, feels drained, had bleeding over the weekend, Friday night  and Saturday morning, and off and on Sunday, didn't call on call MD, , leg cramps, pain left lower abdomen,  loss 10 lbs since 10/17/11,took orthostatic  B/p, sitting = t=97.4, b/p=111/68,p=62,rr=20 standing b/p=121/77,p=104,rr=2o, poor appetite, only had oatmeal and toast for breakfast, no lunch, states she drinks 160 ounces bottled water 5-6x day Drinks decaf tea at times, patient is depressed not knowing what to do, no bleeding today, stopped her Xarelto Friday and Saturday, (blood thinner), restarted last night with evening meal 3:49 PM

## 2011-10-25 NOTE — Progress Notes (Signed)
[facility]    Radiation Oncology [address] [place]     Maryln Gottron, M.D. [place], [place] [address]               Billie Lade, M.D., Ph.D. Phone: [phone]      Molli Hazard A. Kathrynn Running, M.D. Fax: [phone]      Radene Gunning, M.D., Ph.D.         Lurline Hare, M.D.         Grayland Jack, M.D Weekly Treatment Management Note  Name: [name]     MRN: [id number]        CSN: [id number] Date: 10/25/2011      DOB: 1950-04-28  CC: [name] Paci, MD         Felicity Coyer    Status: Outpatient  Diagnosis: The primary encounter diagnosis was Cervical cancer. 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  Current Dose: 18.0 Gy  Current Fraction: 10  Planned Dose: 50.4 Gy  Narrative: [name] was seen today for weekly treatment management. The chart was checked and MVCT  were reviewed. She asked to be seen today for complaints of hematuria. This started the Friday evening and significant early on but less so at this time. Patient had some minimal clots.  She denies any dysuria or strong odor to her urine. Patient is on blood thinner as below and I recommended she discuss this issue with her primary care physician. also recommended she discuss this with her urologist.  She is scheduled for urinalysis and culture later today. Culture last week did not show obvious infection.  Codeine; Flecainide acetate; Morphine; and Propafenone hcl Current Outpatient Prescriptions  Medication Sig Dispense Refill  . diltiazem (CARDIZEM CD) 120 MG 24 hr capsule Take 120 mg by mouth daily before breakfast.      . gabapentin (NEURONTIN) 300 MG capsule Take 1 capsule (300 mg total) by mouth 3 (three) times daily.  90 capsule  5  . hyaluronate sodium (RADIAPLEXRX) GEL Apply 1 application topically 2 (two) times daily.      [name] ibuprofen (ADVIL,MOTRIN) 200 MG tablet Take 400-600 mg by mouth every 6 (six)  hours as needed. Pain      . metoprolol (LOPRESSOR) 50 MG tablet Take 1 tablet (50 mg total) by mouth 2 (two) times daily.  60 tablet  5  . Multiple Vitamin (MULITIVITAMIN WITH MINERALS) TABS Take 1 tablet by mouth daily.      [name] omeprazole (PRILOSEC) 20 MG capsule Take 20 mg by mouth daily.       [name] oxyCODONE (OXYCONTIN) 40 MG 12 hr tablet Take 1 tablet (40 mg total) by mouth 3 (three) times daily.  45 tablet  0  . oxyCODONE-acetaminophen (PERCOCET) 7.5-325 MG per tablet Take 1 tablet by mouth every 4 (four) hours as needed for pain. breakthru pain  60 tablet  0  . sodium chloride (OCEAN) 0.65 % nasal spray Place 1 spray into the nose as needed. Allergies      . Rivaroxaban (XARELTO) 20 MG TABS Take 20 mg by mouth daily.  30 tablet  5   Labs:  Lab Results  Component Value Date   WBC 4.8 10/05/2011   HGB 12.6 10/05/2011   HCT 37.0 10/05/2011   MCV 87.9 10/05/2011   PLT 193 10/05/2011   Lab Results  Component Value Date   CREATININE 1.00 10/05/2011   BUN 19 10/05/2011  NA 140 10/05/2011   K 4.3 10/05/2011   CL 108 10/05/2011   CO2 22 07/31/2011   Lab Results  Component Value Date   ALT 72* 10/05/2011   AST 36 10/05/2011   BILITOT 0.3 10/05/2011    Physical Examination:  weight is 207 lb 1.6 oz (93.94 kg). Her oral temperature is 97.4 F (36.3 C). Her blood pressure is 121/77 and her pulse is 104. Her respiration is 20.    Wt Readings from Last 3 Encounters:  10/25/11 207 lb 1.6 oz (93.94 kg)  10/18/11 217 lb (98.431 kg)  10/07/11 215 lb (97.523 kg)     Lungs - Normal respiratory effort, chest expands symmetrically. Lungs are clear to auscultation, no crackles or wheezes.  Heart has regular rhythm and rate  Abdomen is soft and non tender with normal bowel sounds  Assessment:  Patient tolerating treatments well.  Etiology of the patient's hematuria is unknown at this time. This may be related to her chronic anticoagulation related to her prior history of significant clots.  Plan: Continue  treatment per original radiation prescription    -----------------------------------  Billie Lade, PhD, MD

## 2011-10-26 ENCOUNTER — Ambulatory Visit
Admission: RE | Admit: 2011-10-26 | Discharge: 2011-10-26 | Disposition: A | Discharge status: Home / Self Care | Payer: 59 | Source: Ambulatory Visit | Attending: Radiation Oncology | Admitting: Radiation Oncology

## 2011-10-27 ENCOUNTER — Ambulatory Visit
Admission: RE | Admit: 2011-10-27 | Discharge: 2011-10-27 | Disposition: A | Discharge status: Home / Self Care | Payer: 59 | Source: Ambulatory Visit | Attending: Radiation Oncology | Admitting: Radiation Oncology

## 2011-10-28 ENCOUNTER — Ambulatory Visit
Admission: RE | Admit: 2011-10-28 | Discharge: 2011-10-28 | Disposition: A | Discharge status: Home / Self Care | Payer: 59 | Source: Ambulatory Visit | Attending: Radiation Oncology | Admitting: Radiation Oncology

## 2011-10-28 ENCOUNTER — Ambulatory Visit (INDEPENDENT_AMBULATORY_CARE_PROVIDER_SITE_OTHER): Payer: 59 | Admitting: Internal Medicine

## 2011-10-28 ENCOUNTER — Encounter: Payer: Self-pay | Admitting: Internal Medicine

## 2011-10-28 VITALS — BP 118/76 | HR 71 | Temp 97.8°F | Wt 206.8 lb

## 2011-10-28 DIAGNOSIS — I82409 Acute embolism and thrombosis of unspecified deep veins of unspecified lower extremity: Secondary | ICD-10-CM

## 2011-10-28 DIAGNOSIS — R319 Hematuria, unspecified: Secondary | ICD-10-CM

## 2011-10-28 DIAGNOSIS — C539 Malignant neoplasm of cervix uteri, unspecified: Secondary | ICD-10-CM

## 2011-10-28 NOTE — Patient Instructions (Signed)
It was good to see you today. We have reviewed the interval events since your last visit here Medications reviewed, no changes at this time. No alternative to Xarelto will change your bleeding risk  Continue to discuss the situation with your other specialists- let me know how i can help if needed

## 2011-10-28 NOTE — Progress Notes (Signed)
Subjective:    Patient ID: [name], female    DOB: Dec 09, 1950, 61 y.o.   MRN: [id number]  HPI here for follow up  - hematuria x 2 episodes since starting XRT - uro appt this afternoon for same -  Also reviewed chronic medical issues/interval hx:  ongoing/progressive L flank pain/LLQ - ongoing since 04/2011  Multiple OVs and ER eval for same reviewed Pain is not relieved with max dose ibuprofen, Norco 7.5 or hydrocodone 10 Limited duration of releif with oxy - changed to LA oxy Not changed by position or activity; No radiation of pain into buttock, leg or anterior abdomen Onc feels pain may be related to cancer given increase LN mets on CT 05/2011 > 08/2011 s/p rad onc for tx same - started XRT 09/2011  AF with RVR - difficult mgmt of same, s/p DCCV 09/2010 - briefly off anticoag (pradaxa) due to bleeding complications (hematuria) and low CHADs - but back on anticoag due to LLE DVT 10/2010 - expect lifelong anticoag -No chest pain or palpitations  LLE DVT - dx 10/2010, 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 S/p IVC filter 04/2011 at [facility], LMWH changed to oral 07/2011- complicated by chronic LLE lymphedema, no chest pain or shortness of breath   Cervical cancer, metastatic - dx 04/2003, recurrent dz 2011 - resumed chemo XRT 09/2010 - 03/2011. complicated by renal insuff due to hydro/obstruction in 2005; also s/p RFA 04/2011 for hepatic met - planning XRT fall 2013.   Past Medical History  Diagnosis Date  . Diastolic dysfunction   . MIGRAINE HEADACHE   . Atrial fibrillation     failed DCCV, CHADs2-prev pradaxa stopped due to hematuria with ureter issues/JJ   . Anemia   . HTN (hypertension)   . GLAUCOMA   . CARPAL TUNNEL SYNDROME, RIGHT   . Lymphedema     L>R leg from pelvic XRT/scarring  . Hepatic steatosis     CT scan 06/2009, 12/2009  . DVT (deep venous thrombosis) 10/2010    LLE, anticaog resumed  . CHF (congestive heart failure)     1 yr ago per pt    . Dyspnea on exertion   . Arthritis   . GERD (gastroesophageal reflux disease)   . Lesion of liver   . Small kidney     left  . Difficulty sleeping   . Cervical cancer dx 04/2003, recur 04/2008    s/p debulk (TAHBSO), chemo/XRT; recurrent dz L pelvis dx 2010 and liver met 09/2010 s/p RFA    Review of Systems  Constitutional: Negative for fever, chills, diaphoresis and unexpected weight change.  Cardiovascular: Positive for leg swelling. Negative for chest pain and palpitations.  Genitourinary: Positive for flank pain (left). Negative for dysuria and hematuria.  Musculoskeletal: Negative for joint swelling and gait problem.       Objective:   Physical Exam  BP 118/76  Pulse 71  Temp 97.8 F (36.6 C) (Oral)  Wt 206 lb 12.8 oz (93.804 kg)  SpO2 97% Wt Readings from Last 3 Encounters:  10/28/11 206 lb 12.8 oz (93.804 kg)  10/25/11 207 lb 1.6 oz (93.94 kg)  10/18/11 217 lb (98.431 kg)   Constitutional: [name] is obese but appears well-developed and well-nourished. No distress. Cardiovascular: Irregular rate and rhythm - normal heart sounds.  No murmur heard.  LLE woody edema, from foot to upper thigh with varicose veins - no RLE edema (fatty ankle) Pulmonary/Chest: Effort normal and breath sounds normal.  No respiratory distress. [name] has no wheezes.  Abd: obese, S, NTND, no reproducible L flank tenderness MSkel - Back: full range of motion of thoracic and lumbar spine. Non tender to palpation. DTR's are symmetrically intact. Sensation intact in all dermatomes of the lower extremities. Full strength to manual muscle testing. patient is able to heel toe walk without difficulty and ambulates with mildly antalgic gait. Skin - no bruising or redness Psychiatric: [name] has an approp depressed mood and affect. Her behavior is normal. Judgment and thought content normal.   Lab Results  Component Value Date   WBC 4.8 10/05/2011   HGB 12.6 10/05/2011   HCT 37.0 10/05/2011   PLT 193 10/05/2011   ALT  72* 10/05/2011   AST 36 10/05/2011   NA 140 10/05/2011   K 4.3 10/05/2011   CL 108 10/05/2011   CREATININE 1.00 10/05/2011   BUN 19 10/05/2011   CO2 22 07/31/2011   TSH 1.700 05/16/2010   INR 0.98 05/07/2011   HGBA1C 5.2 09/19/2008   CT A/P 06/09/11 - increase in retroperitoneal LN size left side, s/p hepatic ablation of met Dg Thoracic Spine 2 View  08/03/2011  *RADIOLOGY REPORT*  Clinical Data: Back pain.  THORACIC SPINE - 2 VIEW  Comparison: None.  Findings: Vertebral body height and alignment are normal. Scattered  anterior and right side endplate spurring is noted.  IMPRESSION: No acute finding.  Degenerative disease.  Original Report Authenticated By: Bernadene Bell. D'ALESSIO, M.D.   Dg Lumbar Spine Complete  08/03/2011  *RADIOLOGY REPORT*  Clinical Data: Chronic left-sided abdominal pain and left lower back pain.  LUMBAR SPINE - COMPLETE 4+ VIEW  Comparison: Bone window images from CT abdomen and pelvis 03/22/2011.  Findings: Five non-rib bearing lumbar vertebrae with anatomic alignment.  No fractures.  Mild disc space narrowing and endplate hypertrophic changes at L3-4, unchanged.  Remaining disc spaces well preserved.  Lower thoracic spondylosis.  No pars defects. Facet degenerative changes at L4-5 and to a much greater degree L5- S1. Visualized sacroiliac joints intact.  Osteopenia suspected. IVC filter.  IMPRESSION: No acute osseous abnormality.  Mild degenerative disc disease and spondylosis at L3-4.  Facet degenerative changes at L5-S1 greater than L4-5.  Osteopenia suspected.  Original Report Authenticated By: Arnell Sieving, M.D.   Nm Pet Image Restag (ps) Skull Base To Thigh  09/15/2011  *RADIOLOGY REPORT*  Clinical Data: Subsequent treatment strategy for cervical carcinoma.  Liver metastasis.  NUCLEAR MEDICINE PET SKULL BASE TO THIGH  Fasting Blood Glucose:  97  Technique:  15.6 mCi F-18 FDG was injected intravenously.   CT data was obtained and used for attenuation correction and anatomic localization  only.  (This was not acquired as a diagnostic CT examination.) Additional exam technical data entered  on technologist worksheet.  Comparison: CT abdomen 42,013, PET CT scanning 03/21/1998 the  Findings:  Neck: No hypermetabolic nodes in the neck.  Chest: No hypermetabolic mediastinal or hilar nodes.  No suspicious pulmonary nodules.  Abdomen / Pelvis:Linear low density lesion in the superior right hepatic lobe at the radioablation site.  No significant hypermetabolic activity associated ablation site.  No new hepatic lesions.  Enlarged left periaortic lymph nodes have associated hypermetabolic activity with SUV max = 6.6 (image 150).  These lymph nodes also increased in size measuring 22 x 19 mm compared to 14 x 16 mm on most recent CT scan (06/09/2011).  There is an IVC filter in place. There is inflammation in the left growing which suggests  sequelae of remote deep venous thrombosis.  No hypermetabolic pelvic lymph nodes or sidewall metastasis. Skeleton:No focal hypermetabolic activity to suggest skeletal metastasis.  IMPRESSION:  1.  Enlarging hypermetabolic left retroperitoneal periaortic lymph nodes are most consistent with metastatic nodal progression. 2.  No hypermetabolic activity associated with liver ablation site. 3.  Likely sequela of left deep venous thrombosis.  Original Report Authenticated By: Genevive Bi, M.D.      Assessment & Plan:  See problem list. Medications and labs reviewed today.  Hematuria, gross - intermittent - precipitated by XRT 09/2011 to L RPN in setting of full anticoag -  to see uro for same  L flank pain - due to RP mets/LN increase CT 05/2011 - 08/2011 PET with increase LN activity - Changed to XR oxy -continue oxycodone prn breakthru + gabapentin - follow up rad onc for XRT as planned - support offered

## 2011-10-28 NOTE — Assessment & Plan Note (Signed)
Dx 04/2003: s/p chemo/debulk/XRT Recurrent dx 2/2011L pelvic wall and liver met - resumed chemo 09/2010 - 03/05/2011 Multiple complications related to same and renal involvement/obstruction -  DVT LLE 10/2010 and 04/2011 likely related to hypercoagulable state from malignancy - s/p IVC filter 04/2011 Hepatic metastatic lesion RF ablation done 05/07/11 by  interventional radiology  L flank discomfort related to increase LN size on 05/2011 CT and PET 08/2011> increase L RP LN activity - started XRT for same 09/2011 - continue oxycodone (changed to XR 08/2011 and continued breakthru percocet prn) and neurontin for pain as needed -  continue Miralax to combat constipation - Hematuria x 2 since starting XRT (hx same with ureter lesion during 2011 XRT) - complicated by anticoag Interval history reviewed in EMR and with patient today

## 2011-10-28 NOTE — Assessment & Plan Note (Signed)
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

## 2011-10-29 ENCOUNTER — Ambulatory Visit
Admission: RE | Admit: 2011-10-29 | Discharge: 2011-10-29 | Disposition: A | Discharge status: Home / Self Care | Payer: 59 | Source: Ambulatory Visit | Attending: Radiation Oncology | Admitting: Radiation Oncology

## 2011-11-02 ENCOUNTER — Ambulatory Visit
Admission: RE | Admit: 2011-11-02 | Discharge: 2011-11-02 | Disposition: A | Discharge status: Home / Self Care | Payer: 59 | Source: Ambulatory Visit | Attending: Radiation Oncology | Admitting: Radiation Oncology

## 2011-11-02 ENCOUNTER — Telehealth: Payer: Self-pay | Admitting: Internal Medicine

## 2011-11-02 VITALS — BP 119/76 | HR 85 | Temp 97.8°F | Wt 201.9 lb

## 2011-11-02 DIAGNOSIS — C539 Malignant neoplasm of cervix uteri, unspecified: Secondary | ICD-10-CM

## 2011-11-02 NOTE — Telephone Encounter (Signed)
Forward 5 pages from [facility] To Dr. Sanda Linger for review on 11-02-11 ym

## 2011-11-02 NOTE — Progress Notes (Signed)
[facility]    Radiation Oncology [address] [place]     Maryln Gottron, M.D. [place], [place] [address]               Billie Lade, M.D., Ph.D. Phone: [phone]      Molli Hazard A. Kathrynn Running, M.D. Fax: [phone]      Radene Gunning, M.D., Ph.D.         Lurline Hare, M.D.         Grayland Jack, M.D Weekly Treatment Management Note  Name: [name]     MRN: [id number]        CSN: [id number] Date: 11/02/2011      DOB: 1950/06/08  CC: [name], MD         Felicity Coyer    Status: Outpatient  Diagnosis: The encounter diagnosis was Recurrent cervical cancer.  Current Dose: 2700 cGy  Current Fraction: 15  Planned Dose: 5040 cGy  Narrative: [name] was seen today for weekly treatment management. The chart was checked and MVCT  were reviewed. She continues to have some nausea with her treatments. With pretreatment medication however this is less of an issue. Overall patient feels her pain is starting to improve at this time. She has not cut down on her pain medication at this time. She continues to have some mild hematuria. Patient was seen in urology and the patient elected not to pursue workup of this issue at this time given the potential complications with her previous treatment  Codeine; Flecainide acetate; Morphine; and Propafenone hcl  Current Outpatient Prescriptions  Medication Sig Dispense Refill  . ondansetron (ZOFRAN-ODT) 8 MG disintegrating tablet Take 8 mg by mouth every 8 (eight) hours as needed.      . diltiazem (CARDIZEM CD) 120 MG 24 hr capsule Take 120 mg by mouth daily before breakfast.      . gabapentin (NEURONTIN) 300 MG capsule Take 1 capsule (300 mg total) by mouth 3 (three) times daily.  90 capsule  5  . hyaluronate sodium (RADIAPLEXRX) GEL Apply 1 application topically 2 (two) times daily.      [name] ibuprofen (ADVIL,MOTRIN) 200 MG tablet Take 400-600 mg by mouth every 6 (six) hours as needed. Pain      . metoprolol (LOPRESSOR) 50  MG tablet Take 1 tablet (50 mg total) by mouth 2 (two) times daily.  60 tablet  5  . Multiple Vitamin (MULITIVITAMIN WITH MINERALS) TABS Take 1 tablet by mouth daily.      [name] omeprazole (PRILOSEC) 20 MG capsule Take 20 mg by mouth daily.       [name] oxyCODONE (OXYCONTIN) 40 MG 12 hr tablet Take 1 tablet (40 mg total) by mouth 3 (three) times daily.  45 tablet  0  . oxyCODONE-acetaminophen (PERCOCET) 5-325 MG per tablet Take 2 tablets by mouth every 4 (four) hours as needed for pain.  20 tablet  0  . oxyCODONE-acetaminophen (PERCOCET) 7.5-325 MG per tablet Take 2 tablets by mouth every 6 (six) hours as needed for pain.  60 tablet  0  . oxyCODONE-acetaminophen (PERCOCET) 7.5-325 MG per tablet Take 1 tablet by mouth every 4 (four) hours as needed for pain. breakthru pain  60 tablet  0  . Rivaroxaban (XARELTO) 20 MG TABS Take 20 mg by mouth daily.  30 tablet  5  . sodium chloride (OCEAN) 0.65 % nasal spray Place 1 spray into the nose as needed. Allergies       Labs:  Lab Results  Component Value Date   WBC 4.8 10/05/2011   HGB 12.6 10/05/2011   HCT 37.0 10/05/2011   MCV 87.9 10/05/2011   PLT 193 10/05/2011   Lab Results  Component Value Date   CREATININE 1.00 10/05/2011   BUN 19 10/05/2011   NA 140 10/05/2011   K 4.3 10/05/2011   CL 108 10/05/2011   CO2 22 07/31/2011   Lab Results  Component Value Date   ALT 72* 10/05/2011   AST 36 10/05/2011   BILITOT 0.3 10/05/2011    Physical Examination:  weight is 201 lb 14.4 oz (91.581 kg). Her temperature is 97.8 F (36.6 C). Her blood pressure is 119/76 and her pulse is 85. Her oxygen saturation is 98%.    Wt Readings from Last 3 Encounters:  11/02/11 201 lb 14.4 oz (91.581 kg)  10/28/11 206 lb 12.8 oz (93.804 kg)  10/25/11 207 lb 1.6 oz (93.94 kg)     Lungs - Normal respiratory effort, chest expands symmetrically. Lungs are clear to auscultation, no crackles or wheezes.  Heart has irregular rhythm consistent with atrial fibrillation Abdomen is soft and non tender  with normal bowel sounds  Assessment:  Patient tolerating treatments well except for issues as above.  Plan: Continue treatment per original radiation prescription    -----------------------------------  Billie Lade, PhD, MD

## 2011-11-02 NOTE — Progress Notes (Signed)
Patient here for weekly under treat assessment.Completed 15 of 28 treatments of periaortics.Pain and nausea relieved with prescribed medication of percocet and zofran.Patient will bring in list of anti-emetics to be added to medication list.Moderate fatigue.Occasional visible blood in urine but no other urinary symptoms.

## 2011-11-03 ENCOUNTER — Ambulatory Visit
Admission: RE | Admit: 2011-11-03 | Discharge: 2011-11-03 | Disposition: A | Discharge status: Home / Self Care | Payer: 59 | Source: Ambulatory Visit | Attending: Radiation Oncology | Admitting: Radiation Oncology

## 2011-11-04 ENCOUNTER — Other Ambulatory Visit: Payer: Self-pay | Admitting: Radiation Oncology

## 2011-11-04 ENCOUNTER — Ambulatory Visit
Admission: RE | Admit: 2011-11-04 | Discharge: 2011-11-04 | Disposition: A | Discharge status: Home / Self Care | Payer: 59 | Source: Ambulatory Visit | Attending: Radiation Oncology | Admitting: Radiation Oncology

## 2011-11-04 ENCOUNTER — Encounter: Payer: Self-pay | Admitting: Dietician

## 2011-11-04 MED ORDER — OXYCODONE HCL 20 MG PO TB12: 20.0000 mg | ORAL_TABLET | Freq: Three times a day (TID) | ORAL | Status: DC

## 2011-11-04 NOTE — Progress Notes (Signed)
Brief Out-patient Oncology Nutrition Note  Reason: Patient Screened Positive For Nutrition Risk For Unintentional Weight Loss and Decreased Appetite.   [name] is a 61 year old female patient of Dr. Roselind Messier, diagnosed with cervical cancer. Attempted to contact patient via telephone for positive nutrition risk. Patient with out voice mail option on home and cell phone numbers, unable to leave voice mail with RD contact information.   Wt Readings from Last 10 Encounters:  11/02/11 201 lb 14.4 oz (91.581 kg)  10/28/11 206 lb 12.8 oz (93.804 kg)  10/25/11 207 lb 1.6 oz (93.94 kg)  10/18/11 217 lb (98.431 kg)  10/07/11 215 lb (97.523 kg)  09/29/11 216 lb 11.2 oz (98.294 kg)  09/28/11 216 lb 1.9 oz (98.031 kg)  09/24/11 219 lb (99.338 kg)  09/06/11 215 lb 1.9 oz (97.578 kg)  08/03/11 224 lb 8 oz (101.833 kg)     RD available for nutrition needs.   Iven Finn, MS, RD, LDN [phone]

## 2011-11-05 ENCOUNTER — Ambulatory Visit
Admission: RE | Admit: 2011-11-05 | Discharge: 2011-11-05 | Disposition: A | Discharge status: Home / Self Care | Payer: 59 | Source: Ambulatory Visit | Attending: Radiation Oncology | Admitting: Radiation Oncology

## 2011-11-05 ENCOUNTER — Telehealth: Payer: Self-pay

## 2011-11-05 NOTE — Telephone Encounter (Signed)
Prior authorization has been called into [facility] for oxycontin 20 mg 1 tab po three times daily.Spoke with Blake Divine and states it may take 24 to 72 hours but will mark as urgent.Awaiting fax report afterwhich pharmacy will be notified.

## 2011-11-05 NOTE — Addendum Note (Signed)
Encounter addended by: Tessa Lerner, RN on: 11/05/2011  3:55 PM<BR>     Documentation filed: Charges VN

## 2011-11-08 ENCOUNTER — Ambulatory Visit
Admission: RE | Admit: 2011-11-08 | Discharge: 2011-11-08 | Disposition: A | Discharge status: Home / Self Care | Payer: 59 | Source: Ambulatory Visit | Attending: Radiation Oncology | Admitting: Radiation Oncology

## 2011-11-09 ENCOUNTER — Encounter: Payer: Self-pay | Admitting: Radiation Oncology

## 2011-11-09 ENCOUNTER — Ambulatory Visit
Admission: RE | Admit: 2011-11-09 | Discharge: 2011-11-09 | Disposition: A | Discharge status: Home / Self Care | Payer: 59 | Source: Ambulatory Visit | Attending: Radiation Oncology | Admitting: Radiation Oncology

## 2011-11-09 VITALS — BP 115/73 | HR 82 | Temp 98.0°F | Resp 20 | Wt 199.9 lb

## 2011-11-09 DIAGNOSIS — C772 Secondary and unspecified malignant neoplasm of intra-abdominal lymph nodes: Secondary | ICD-10-CM

## 2011-11-09 LAB — COMPREHENSIVE METABOLIC PANEL (CC13)
Albumin: 3.6 g/dL (ref 3.5–5.0)
BUN: 14 mg/dL (ref 7.0–26.0)
CO2: 25 mEq/L (ref 22–29)
Calcium: 10.5 mg/dL — ABNORMAL HIGH (ref 8.4–10.4)
Chloride: 100 mEq/L (ref 98–107)
Glucose: 113 mg/dl — ABNORMAL HIGH (ref 70–99)
Potassium: 4.2 mEq/L (ref 3.5–5.1)
Sodium: 135 mEq/L — ABNORMAL LOW (ref 136–145)
Total Protein: 7.3 g/dL (ref 6.4–8.3)

## 2011-11-09 LAB — CBC WITH DIFFERENTIAL/PLATELET
Basophils Absolute: 0 10*3/uL (ref 0.0–0.1)
Eosinophils Absolute: 0.2 10*3/uL (ref 0.0–0.5)
HGB: 12.5 g/dL (ref 11.6–15.9)
MCV: 86.4 fL (ref 79.5–101.0)
MONO#: 0.5 10*3/uL (ref 0.1–0.9)
MONO%: 14.4 % — ABNORMAL HIGH (ref 0.0–14.0)
NEUT#: 2.7 10*3/uL (ref 1.5–6.5)
RDW: 14.7 % — ABNORMAL HIGH (ref 11.2–14.5)
WBC: 3.6 10*3/uL — ABNORMAL LOW (ref 3.9–10.3)
lymph#: 0.1 10*3/uL — ABNORMAL LOW (ref 0.9–3.3)

## 2011-11-09 MED ORDER — OXYCODONE-ACETAMINOPHEN 7.5-325 MG PO TABS: 1.0000 | ORAL_TABLET | ORAL | Status: DC | PRN

## 2011-11-09 MED ORDER — ONDANSETRON HCL 8 MG PO TABS: 8.0000 mg | ORAL_TABLET | Freq: Three times a day (TID) | ORAL | Status: AC | PRN

## 2011-11-09 NOTE — Progress Notes (Signed)
weekly rad txs periaortics 20/28,  Patient alert,oriented x3, very fatigued, but feels better than yesterday, vitals wnl, patient states"pink tinge smudge on tissue when she wipes after voiding and pink colored urine in toilet, "states"very odourous foul smelling, used a vinegar douche ytsterday stated "It helped with the smell for a while", poor appetitie, makes her self eat, no pain, normal bowel movments for her 3:53 PM

## 2011-11-09 NOTE — Progress Notes (Signed)
[facility]    Radiation Oncology [address] [place]     [name], M.D. [place], [place] [address]               [name], M.D., Ph.D. Phone: [phone]      [name], M.D. Fax: [phone]      [name], M.D., Ph.D.         [name], M.D.         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  Current Dose: 36.0 Gy  Current Fraction: 20  Planned Dose: 50.4 Gy  Narrative: [name] was seen today for weekly treatment management. The chart was checked and MVCT  were reviewed. She has noticed slight improvement in her pain. She does have some nausea with her treatments for which she takes Zofran. Patient did notice a foul-smelling vaginal discharge. She used a vinegar douche which helped  somewhat. I asked patient to let us know if this continues. She may need a pelvic exam. She denies any blood in the vaginal discharge. She denies any stool in the vaginal discharge.  Codeine; Flecainide acetate; Morphine; and Propafenone hcl  Current Outpatient Prescriptions  Medication Sig Dispense Refill  . diltiazem (CARDIZEM CD) 120 MG 24 hr capsule Take 120 mg by mouth daily before breakfast.      . gabapentin (NEURONTIN) 300 MG capsule Take 1 capsule (300 mg total) by mouth 3 (three) times daily.  90 capsule  5  . hyaluronate sodium (RADIAPLEXRX) GEL Apply 1 application topically 2 (two) times daily.      [name] ibuprofen (ADVIL,MOTRIN) 200 MG tablet Take 400-600 mg by mouth every 6 (six) hours as needed. Pain      . metoprolol (LOPRESSOR) 50 MG tablet Take 1 tablet (50 mg total) by mouth 2 (two) times daily.  60 tablet  5  . Multiple Vitamin  (MULITIVITAMIN WITH MINERALS) TABS Take 1 tablet by mouth daily.      [name] omeprazole (PRILOSEC) 20 MG capsule Take 20 mg by mouth daily.       . ondansetron (ZOFRAN-ODT) 8 MG disintegrating tablet Take 8 mg by mouth every 8 (eight) hours as needed.      [name] oxyCODONE (OXYCONTIN) 20 MG 12 hr tablet Take 1 tablet (20 mg total) by mouth 3 (three) times daily.  90 tablet  0  . oxyCODONE-acetaminophen (PERCOCET) 5-325 MG per tablet Take 2 tablets by mouth every 4 (four) hours as needed for pain.  20 tablet  0  . oxyCODONE-acetaminophen (PERCOCET) 7.5-325 MG per tablet Take 2 tablets by mouth every 6 (six) hours as needed for pain.  60 tablet  0  . oxyCODONE-acetaminophen (PERCOCET) 7.5-325 MG per tablet Take 1 tablet by mouth every 4 (four) hours as needed for pain. breakthru pain  60 tablet  0  . Rivaroxaban (XARELTO) 20 MG TABS Take 20 mg by mouth daily.  30 tablet  5  . sodium chloride (OCEAN) 0.65 % nasal spray Place 1 spray into the nose as needed. Allergies      . DISCONTD: oxyCODONE-acetaminophen (PERCOCET) 7.5-325  MG per tablet Take 1 tablet by mouth every 4 (four) hours as needed for pain. breakthru pain  60 tablet  0   Labs:  Lab Results  Component Value Date   WBC 4.8 10/05/2011   HGB 12.6 10/05/2011   HCT 37.0 10/05/2011   MCV 87.9 10/05/2011   PLT 193 10/05/2011   Lab Results  Component Value Date   CREATININE 1.00 10/05/2011   BUN 19 10/05/2011   NA 140 10/05/2011   K 4.3 10/05/2011   CL 108 10/05/2011   CO2 22 07/31/2011   Lab Results  Component Value Date   ALT 72* 10/05/2011   AST 36 10/05/2011   BILITOT 0.3 10/05/2011    Physical Examination:  weight is 199 lb 14.4 oz (90.674 kg). Her temperature is 98 F (36.7 C). Her blood pressure is 115/73 and her pulse is 82. Her respiration is 20.    Wt Readings from Last 3 Encounters:  11/09/11 199 lb 14.4 oz (90.674 kg)  11/02/11 201 lb 14.4 oz (91.581 kg)  10/28/11 206 lb 12.8 oz (93.804 kg)     Lungs - Normal respiratory effort, chest expands  symmetrically. Lungs are clear to auscultation, no crackles or wheezes.  Heart has regular rhythm and rate  Abdomen is soft and non tender with normal bowel sounds  Assessment:  Patient tolerating treatments reasonably well except for issues as above.  Plan: Continue treatment per original radiation prescription.  She is having a lot of fatigue and a poor appetite. I have scheduled patient for routine blood work to see if this will explain  these issues.  -----------------------------------  [name] Lade, PhD, Stone

## 2011-11-09 NOTE — Addendum Note (Signed)
Encounter addended by: Billie Lade, MD on: 11/09/2011  4:49 PM<BR>     Documentation filed: Orders

## 2011-11-10 ENCOUNTER — Ambulatory Visit
Admission: RE | Admit: 2011-11-10 | Discharge: 2011-11-10 | Disposition: A | Discharge status: Home / Self Care | Payer: 59 | Source: Ambulatory Visit | Attending: Radiation Oncology | Admitting: Radiation Oncology

## 2011-11-11 ENCOUNTER — Ambulatory Visit
Admission: RE | Admit: 2011-11-11 | Discharge: 2011-11-11 | Disposition: A | Discharge status: Home / Self Care | Payer: 59 | Source: Ambulatory Visit | Attending: Radiation Oncology | Admitting: Radiation Oncology

## 2011-11-12 ENCOUNTER — Ambulatory Visit
Admission: RE | Admit: 2011-11-12 | Discharge: 2011-11-12 | Disposition: A | Discharge status: Home / Self Care | Payer: 59 | Source: Ambulatory Visit | Attending: Radiation Oncology | Admitting: Radiation Oncology

## 2011-11-12 ENCOUNTER — Encounter: Payer: Self-pay | Admitting: *Deleted

## 2011-11-15 ENCOUNTER — Ambulatory Visit
Admission: RE | Admit: 2011-11-15 | Discharge: 2011-11-15 | Disposition: A | Discharge status: Home / Self Care | Payer: 59 | Source: Ambulatory Visit | Attending: Radiation Oncology | Admitting: Radiation Oncology

## 2011-11-16 ENCOUNTER — Ambulatory Visit
Admission: RE | Admit: 2011-11-16 | Discharge: 2011-11-16 | Disposition: A | Discharge status: Home / Self Care | Payer: 59 | Source: Ambulatory Visit | Attending: Radiation Oncology | Admitting: Radiation Oncology

## 2011-11-16 VITALS — BP 91/62 | HR 87 | Temp 97.6°F | Wt 197.8 lb

## 2011-11-16 DIAGNOSIS — C772 Secondary and unspecified malignant neoplasm of intra-abdominal lymph nodes: Secondary | ICD-10-CM

## 2011-11-16 MED ORDER — LORAZEPAM 1 MG PO TABS: 1.0000 mg | ORAL_TABLET | Freq: Three times a day (TID) | ORAL | Status: DC

## 2011-11-16 NOTE — Progress Notes (Signed)
Patient here for routine weekly under treat assessment of periaortics.Increased nausea but is taking zofran three times daily.Constipated no bowel movement in 4 days but has not taken miralax.To resume miralax and increase fluid intake. Increased fatigue.

## 2011-11-16 NOTE — Progress Notes (Signed)
[facility]    Radiation Oncology [address] [place]     Maryln Gottron, M.D. [place], [place] [address]               Billie Lade, M.D., Ph.D. Phone: [phone]      Molli Hazard A. Kathrynn Running, M.D. Fax: [phone]      Radene Gunning, M.D., Ph.D.         Lurline Hare, M.D.         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  Current Dose: 4500 cGy  Current Fraction: 25  Planned Dose: 5040 cGy  Narrative: [name] was seen today for weekly treatment management. The chart was checked and MVCT  were reviewed. She continues to have problems with nausea. She does take Zofran on regular basis for this issue. My this continued problem I have given her a prescription for Ativan 1 mg to see if this will work. She understands that she may take this in between her Zofran if needed. Her pain is starting to improve. She stopped taking her OxyContin the 3 times a day. She is taking less  breakthrough pain med,  once or twice a day.  Codeine; Flecainide acetate; Morphine; and Propafenone hcl  Current Outpatient Prescriptions  Medication Sig Dispense Refill  . diltiazem (CARDIZEM CD) 120 MG 24 hr capsule Take 120 mg by mouth daily before breakfast.      . gabapentin (NEURONTIN) 300 MG capsule Take 1 capsule (300 mg total) by mouth 3 (three) times daily.  90 capsule  5  . hyaluronate sodium (RADIAPLEXRX) GEL Apply 1 application topically 2 (two) times daily.      [name] ibuprofen (ADVIL,MOTRIN) 200 MG tablet Take 400-600 mg by mouth every 6 (six) hours as needed. Pain      . LORazepam (ATIVAN) 1 MG tablet Take 1 tablet (1 mg total) by mouth every 8 (eight) hours.  30 tablet  0  .  metoprolol (LOPRESSOR) 50 MG tablet Take 1 tablet (50 mg total) by mouth 2 (two) times daily.  60 tablet  5  . Multiple Vitamin (MULITIVITAMIN WITH MINERALS) TABS Take 1 tablet by mouth daily.      [name] omeprazole (PRILOSEC) 20 MG capsule Take 20 mg by mouth daily.       . ondansetron (ZOFRAN) 8 MG tablet Take 1 tablet (8 mg total) by mouth every 8 (eight) hours as needed for nausea.  20 tablet  0  . ondansetron (ZOFRAN-ODT) 8 MG disintegrating tablet Take 8 mg by mouth every 8 (eight) hours as needed.      [name] oxyCODONE (OXYCONTIN) 20 MG 12 hr tablet Take 1 tablet (20 mg total) by mouth 3 (three) times daily.  90 tablet  0  . oxyCODONE-acetaminophen (PERCOCET) 5-325 MG per tablet Take 2 tablets by mouth every 4 (four) hours as needed for pain.  20 tablet  0  . oxyCODONE-acetaminophen (PERCOCET) 7.5-325 MG per tablet Take 2 tablets by mouth every 6 (six) hours as needed for pain.  60 tablet  0  . oxyCODONE-acetaminophen (PERCOCET) 7.5-325 MG per tablet Take 1 tablet  by mouth every 4 (four) hours as needed for pain. breakthru pain  60 tablet  0  . Rivaroxaban (XARELTO) 20 MG TABS Take 20 mg by mouth daily.  30 tablet  5  . sodium chloride (OCEAN) 0.65 % nasal spray Place 1 spray into the nose as needed. Allergies       Labs:  Lab Results  Component Value Date   WBC 3.6* 11/09/2011   HGB 12.5 11/09/2011   HCT 37.1 11/09/2011   MCV 86.4 11/09/2011   PLT 181 11/09/2011   Lab Results  Component Value Date   CREATININE 1.2* 11/09/2011   BUN 14.0 11/09/2011   NA 135* 11/09/2011   K 4.2 11/09/2011   CL 100 11/09/2011   CO2 25 11/09/2011   Lab Results  Component Value Date   ALT 41 11/09/2011   AST 19 11/09/2011   BILITOT 0.40 11/09/2011    Physical Examination:  weight is 197 lb 12.8 oz (89.721 kg). Her temperature is 97.6 F (36.4 C). Her blood pressure is 91/62 and her pulse is 87.    Wt Readings from Last 3 Encounters:  11/16/11 197 lb 12.8 oz (89.721 kg)  11/09/11 199 lb 14.4 oz (90.674 kg)    11/02/11 201 lb 14.4 oz (91.581 kg)     Lungs - Normal respiratory effort, chest expands symmetrically. Lungs are clear to auscultation, no crackles or wheezes.  Heart has regular rhythm and rate  Abdomen is soft and non tender with normal bowel sounds  Assessment:  Patient tolerating treatments well except for nausea as above. Her pain improvement is a good sign at this time.  Plan: Continue treatment per original radiation prescription    -----------------------------------  Billie Lade, PhD, MD

## 2011-11-17 ENCOUNTER — Ambulatory Visit
Admission: RE | Admit: 2011-11-17 | Discharge: 2011-11-17 | Disposition: A | Discharge status: Home / Self Care | Payer: 59 | Source: Ambulatory Visit | Attending: Radiation Oncology | Admitting: Radiation Oncology

## 2011-11-18 ENCOUNTER — Ambulatory Visit
Admission: RE | Admit: 2011-11-18 | Discharge: 2011-11-18 | Disposition: A | Discharge status: Home / Self Care | Payer: 59 | Source: Ambulatory Visit | Attending: Radiation Oncology | Admitting: Radiation Oncology

## 2011-11-18 NOTE — Progress Notes (Signed)
Patient to nursing for assessment of feeling dizzy.vitals obtained.Orthostatic SBP less than 20 points, not much change in heart rate.Patient took ativan today prior to coming to treatment as nausea uncontrolled with zofran.Think light headedness from ativan.Encouraged to push fluids and rest. Will check again on tomorrow as completes treatment.Reassured most of symptoms related to radiation and that the symptoms will gradually improve.

## 2011-11-19 ENCOUNTER — Encounter: Payer: Self-pay | Admitting: Radiation Oncology

## 2011-11-19 ENCOUNTER — Ambulatory Visit
Admission: RE | Admit: 2011-11-19 | Discharge: 2011-11-19 | Disposition: A | Discharge status: Home / Self Care | Payer: 59 | Source: Ambulatory Visit | Attending: Radiation Oncology | Admitting: Radiation Oncology

## 2011-11-19 VITALS — BP 100/66 | HR 81 | Resp 18 | Wt 198.7 lb

## 2011-11-19 DIAGNOSIS — C772 Secondary and unspecified malignant neoplasm of intra-abdominal lymph nodes: Secondary | ICD-10-CM

## 2011-11-19 MED ORDER — RADIAPLEXRX EX GEL
Freq: Once | CUTANEOUS | Status: AC
  Administered 2011-11-19: 16:00:00 via TOPICAL

## 2011-11-19 NOTE — Progress Notes (Signed)
Patient presents to the clinic today accompanied by a family member for a PUT with Dr. Basilio Cairo following final treatment. Patient is alert and oriented to person, place, and time. No distress noted. Steady gait noted. Pleasant affect noted. Patient denies pain at this time. Patient denies skin changes. Patient reports using Radiaplex gel on treatment area. Encouraged patient to continue this practice for the next two weeks. Patient verbalized understanding. Provided patient with another tube of radiaplex. Provided patient with an appointment card to return in one month for follow up. Encouraged patient to call with needs and she verbalized understanding. Patient reports battling with constipation. Educated patient reference colace while taking pain medication. Patient reports persistent nausea with occasional vomiting. Patient reports being light headed and dizzy most of this week. Reported all findings to Dr. Basilio Cairo.

## 2011-11-19 NOTE — Progress Notes (Signed)
   Weekly Management Note Completed Radiotherapy. Total Dose:  50.4 Gy   Narrative:  The patient presents for routine under treatment assessment on last day of radiotherapy.  CBCT/MVCT images/Port film x-rays were reviewed.  The chart was checked. She reports continuous nausea. She vomited 2 times a past week. She takes Zofran every 3 hours and Ativan for breakthrough nausea. Has some dizziness. She is drinking at least 8-10 cups of fluid a day. She reports poor taste in her mouth. She has had constipation over the past week and is only taking stool softeners.  Physical Findings:  weight is 198 lb 11.2 oz (90.13 kg). Her blood pressure is 100/66 and her pulse is 81. Her respiration is 18.  very faint hyperpigmentation of the patient's back. Skin is intact. Abdomen is soft , nontender , nondistended.  Impression:  The patient has tolerated radiotherapy.  Plan:  Routine follow-up in one month. I advised the patient to start using MiraLAX for her constipation. I'm hoping that this will help with her nausea. She knows to call if she has any issues before her one-month followup with Dr. Roselind Messier.  ___________________________   Lonie Peak, M.D.

## 2011-11-22 NOTE — Progress Notes (Incomplete)
°  Radiation Oncology         [phone]) [phone] ________________________________  Name: [name] MRN: [id number]  Date: 11/19/2011  DOB: 15-Jul-1950  End of Treatment Note  Diagnosis:  Recurrent cervical cancer  Indication for treatment:  ***       Radiation treatment dates:  10/11/2011-11/19/2011  Site/dose:   ***  Beams/energy:   ***  Narrative: The patient tolerated radiation treatment relatively well.   ***  Plan: The patient has completed radiation treatment. The patient will return to radiation oncology clinic for routine followup in one month. I advised them to call or return sooner if they have any questions or concerns related to their recovery or treatment.  -----------------------------------  Billie Lade, PhD, MD

## 2011-11-23 ENCOUNTER — Encounter: Payer: Self-pay | Admitting: Radiation Oncology

## 2011-11-23 ENCOUNTER — Ambulatory Visit
Admission: RE | Admit: 2011-11-23 | Discharge: 2011-11-23 | Disposition: A | Discharge status: Home / Self Care | Payer: 59 | Source: Ambulatory Visit | Attending: Radiation Oncology | Admitting: Radiation Oncology

## 2011-11-23 VITALS — BP 103/72 | HR 90 | Temp 97.4°F | Resp 20 | Wt 196.1 lb

## 2011-11-23 DIAGNOSIS — C772 Secondary and unspecified malignant neoplasm of intra-abdominal lymph nodes: Secondary | ICD-10-CM

## 2011-11-23 NOTE — Progress Notes (Signed)
Pt completed radiation 11/19/11. Pt reports over weekend she continued to have vaginal bleeding as she experienced intermittently during radiation tx; she states "not every day". She states she last had bleeding Sun morning. Pt also reports fatigue, weakness and states her right eye "feels like it is twitching and going to the center at times". This sensation comes and goes quickly. She states it has occurred less freq in past 2 days.  Pt also states her BP Sat. was "80's over 50's". She has not taken her BP med since Sat. Pt denies pain this morning, states she is still taking Oxycodone 20 mg q12 hr but is requiring Percocet for breakthrough less often, not every day. Pt will see Dr Roselind Messier today.

## 2011-11-23 NOTE — Progress Notes (Signed)
Radiation Oncology         ([phone]) [phone] ________________________________  Name: [name] MRN: [id number]  Date: 11/23/2011  DOB: January 30, 1951  Follow-Up Visit Note  CC: [name], MD  Newt Lukes, MD  Diagnosis:   Recurrent cervical  Interval Since Last Radiation:  4 days  Narrative:  The patient returns today for for evaluation of recent onset of visual problems.  Over the weekend the patient experienced some short duration double vision. This would last for only a few seconds. She denies any nausea or headaches. She continues to have pain but overall this is better. Patient denies any visual field defects.                               ALLERGIES:  is allergic to codeine; flecainide acetate; morphine; and propafenone hcl.  Meds: Current Outpatient Prescriptions  Medication Sig Dispense Refill  . diltiazem (CARDIZEM CD) 120 MG 24 hr capsule Take 120 mg by mouth daily before breakfast.      . gabapentin (NEURONTIN) 300 MG capsule Take 1 capsule (300 mg total) by mouth 3 (three) times daily.  90 capsule  5  . hyaluronate sodium (RADIAPLEXRX) GEL Apply 1 application topically 2 (two) times daily.      [name] ibuprofen (ADVIL,MOTRIN) 200 MG tablet Take 400-600 mg by mouth every 6 (six) hours as needed. Pain      . LORazepam (ATIVAN) 1 MG tablet Take 1 tablet (1 mg total) by mouth every 8 (eight) hours.  30 tablet  0  . metoprolol (LOPRESSOR) 50 MG tablet Take 1 tablet (50 mg total) by mouth 2 (two) times daily.  60 tablet  5  . Multiple Vitamin (MULITIVITAMIN WITH MINERALS) TABS Take 1 tablet by mouth daily.      [name] omeprazole (PRILOSEC) 20 MG capsule Take 20 mg by mouth daily.       . ondansetron (ZOFRAN-ODT) 8 MG disintegrating tablet Take 8 mg by mouth every 8 (eight) hours as needed.      [name] oxyCODONE (OXYCONTIN) 20 MG 12 hr tablet Take 1 tablet (20 mg total) by mouth 3 (three) times daily.  90 tablet  0  . oxyCODONE-acetaminophen (PERCOCET) 5-325 MG per tablet Take 2  tablets by mouth every 4 (four) hours as needed for pain.  20 tablet  0  . oxyCODONE-acetaminophen (PERCOCET) 7.5-325 MG per tablet Take 2 tablets by mouth every 6 (six) hours as needed for pain.  60 tablet  0  . oxyCODONE-acetaminophen (PERCOCET) 7.5-325 MG per tablet Take 1 tablet by mouth every 4 (four) hours as needed for pain. breakthru pain  60 tablet  0  . Rivaroxaban (XARELTO) 20 MG TABS Take 20 mg by mouth daily.  30 tablet  5  . sodium chloride (OCEAN) 0.65 % nasal spray Place 1 spray into the nose as needed. Allergies        Physical Findings: The patient is in no acute distress. Patient is alert and oriented.  weight is 196 lb 1.6 oz (88.95 kg). Her oral temperature is 97.4 F (36.3 C). Her blood pressure is 103/72 and her pulse is 90. Her respiration is 20. .  Cranial nerves II through XII are intact.  Motor strength is 5 out of 5 in the proximal and distal muscle groups in the upper lower extremities. The lungs are clear to auscultation. The heart has an irregular rhythm.  The abdomen is soft and  nontender with normal bowel sounds  Lab Findings: Lab Results  Component Value Date   WBC 3.6* 11/09/2011   HGB 12.5 11/09/2011   HCT 37.1 11/09/2011   MCV 86.4 11/09/2011   PLT 181 11/09/2011    @LASTCHEM @  Radiographic Findings: No results found.  Impression:  The patient is recovering from the effects of radiation.  I did not detect any reason to explain her short duration double vision. I recommend she followup with her ophthalmologist in the near future for further detailed eye exam.  Patient will call if her symptoms worsen in which case I will schedule her for a MRI the brain. Patient will otherwise return in approximately 3 weeks for followup of her periaortic radiation    _____________________________________    Billie Lade, PhD, MD

## 2011-11-24 ENCOUNTER — Telehealth: Payer: Self-pay | Admitting: Emergency Medicine

## 2011-11-24 NOTE — Telephone Encounter (Signed)
ERROR

## 2011-11-28 NOTE — Progress Notes (Signed)
   Department of Radiation Oncology  Phone:  [phone] Fax:        [phone]   Special treatment procedure note  On August 1 patient underwent simulation for her radiation treatments directed at the periaortic area. Additional time was taken in reviewing the patient's previous treatments directed at the pelvis region and potential for overlap. Given the additional time in reviewing the patient's previous treatment as it relates to her current set up, this constitutes a special treatment procedure.  -----------------------------------  Billie Lade, PhD, MD

## 2011-11-28 NOTE — Progress Notes (Signed)
  Radiation Oncology         [phone] ________________________________  Name: MERIAL AFTAB MRN: [id number]  Date: 11/19/2011  DOB: 11-09-1950  End of Treatment Note  Diagnosis:   Recurrent cervical cancer     Indication for treatment:  Recurrence in the periaortic area and pain       Radiation treatment dates:   10/11/2011 through 11/19/2011  Site/dose:   Periaortic area,   5040 cGy in 28 fractions  Beams/energy:   Helical intensity modulated radiation therapy using 6 MV photons  Narrative: The patient tolerated radiation treatment relatively well.   She did have some problems with nausea during the course of her treatment. During the last week of her treatment she had noticed some improvement in her pain.  Plan: The patient has completed radiation treatment. The patient will return to radiation oncology clinic for routine followup in one month. I advised them to call or return sooner if they have any questions or concerns related to their recovery or treatment.  -----------------------------------  Billie Lade, PhD, MD

## 2011-11-28 NOTE — Progress Notes (Signed)
   Department of Radiation Oncology  Phone:  [phone] Fax:        [phone]   Intensity modulated radiation therapy simulation note  On 10/07/2011 the patient had completion of her IMRT. plan directed at the periaortic area.  I personally reviewed the patient's plan including dose volume histograms of the target area as well as critical structures. The patient's plan was approved on August 8. The IMRT was chosen to treat this area in light of  the close proximity to the left kidney and small bowel issue.  -----------------------------------  Billie Lade, PhD, MD

## 2011-12-06 ENCOUNTER — Other Ambulatory Visit: Payer: Self-pay | Admitting: Radiation Oncology

## 2011-12-06 MED ORDER — OXYCODONE HCL 20 MG PO TB12: 20.0000 mg | ORAL_TABLET | Freq: Two times a day (BID) | ORAL | Status: DC

## 2011-12-06 MED ORDER — OXYCODONE HCL 20 MG PO TB12: 20.0000 mg | ORAL_TABLET | Freq: Three times a day (TID) | ORAL | Status: DC

## 2011-12-22 ENCOUNTER — Encounter: Payer: Self-pay | Admitting: Radiation Oncology

## 2011-12-23 ENCOUNTER — Inpatient Hospital Stay
Admission: RE | Admit: 2011-12-23 | Discharge: 2011-12-23 | Disposition: A | Discharge status: Home / Self Care | Payer: 59 | Source: Ambulatory Visit | Attending: Radiation Oncology | Admitting: Radiation Oncology

## 2011-12-23 ENCOUNTER — Encounter: Payer: Self-pay | Admitting: Radiation Oncology

## 2011-12-23 ENCOUNTER — Inpatient Hospital Stay: Admission: RE | Admit: 2011-12-23 | Payer: Self-pay | Source: Ambulatory Visit | Admitting: Radiation Oncology

## 2011-12-23 ENCOUNTER — Ambulatory Visit: Admission: RE | Admit: 2011-12-23 | Payer: 59 | Source: Ambulatory Visit

## 2011-12-23 VITALS — BP 116/86 | HR 97 | Temp 97.4°F | Wt 192.0 lb

## 2011-12-23 DIAGNOSIS — C539 Malignant neoplasm of cervix uteri, unspecified: Secondary | ICD-10-CM

## 2011-12-23 DIAGNOSIS — Z923 Personal history of irradiation: Secondary | ICD-10-CM | POA: Insufficient documentation

## 2011-12-23 DIAGNOSIS — Z886 Allergy status to analgesic agent status: Secondary | ICD-10-CM | POA: Insufficient documentation

## 2011-12-23 DIAGNOSIS — Z79899 Other long term (current) drug therapy: Secondary | ICD-10-CM | POA: Insufficient documentation

## 2011-12-23 DIAGNOSIS — I4891 Unspecified atrial fibrillation: Secondary | ICD-10-CM | POA: Insufficient documentation

## 2011-12-23 NOTE — Progress Notes (Signed)
Radiation Oncology         [phone] ________________________________  Name: HALEEMAH BUCKALEW MRN: [id number]  Date: 12/23/2011  DOB: 13-Jun-1950  Follow-Up Visit Note  CC: [name], MD  Newt Lukes, MD  Diagnosis:   Recurrent cervical cancer  Interval Since Last Radiation:  1 months  Narrative:  The patient returns today for routine follow-up.  Overall her pain is much improved. She is down to taking only 20 mg of OxyContin twice daily. She is not requiring any breakthrough pain medication at this time. She in addition stop taking Neurontin and since that time and had less problems with her vision. She denies any headaches.  She has experienced some discomfort in the right groin and feels a bulge in this area.                              ALLERGIES:  is allergic to codeine; flecainide acetate; morphine; and propafenone hcl.  Meds: Current Outpatient Prescriptions  Medication Sig Dispense Refill  . diltiazem (CARDIZEM CD) 120 MG 24 hr capsule Take 120 mg by mouth daily before breakfast.      . ibuprofen (ADVIL,MOTRIN) 200 MG tablet Take 400-600 mg by mouth every 6 (six) hours as needed. Pain      . LORazepam (ATIVAN) 1 MG tablet Take 1 tablet (1 mg total) by mouth every 8 (eight) hours.  30 tablet  0  . metoprolol (LOPRESSOR) 50 MG tablet Take 1 tablet (50 mg total) by mouth 2 (two) times daily.  60 tablet  5  . Multiple Vitamin (MULITIVITAMIN WITH MINERALS) TABS Take 1 tablet by mouth daily.      [name] omeprazole (PRILOSEC) 20 MG capsule Take 20 mg by mouth daily.       . ondansetron (ZOFRAN-ODT) 8 MG disintegrating tablet Take 8 mg by mouth every 8 (eight) hours as needed.      [name] oxyCODONE (OXYCONTIN) 20 MG 12 hr tablet Take 1 tablet (20 mg total) by mouth every 12 (twelve) hours.  60 tablet  0  . oxyCODONE-acetaminophen (PERCOCET) 7.5-325 MG per tablet Take 1 tablet by mouth every 4 (four) hours as needed for pain. breakthru pain  60 tablet  0  . Rivaroxaban (XARELTO)  20 MG TABS Take 20 mg by mouth daily.  30 tablet  5  . sodium chloride (OCEAN) 0.65 % nasal spray Place 1 spray into the nose as needed. Allergies      . oxyCODONE-acetaminophen (PERCOCET) 5-325 MG per tablet Take 2 tablets by mouth every 4 (four) hours as needed for pain.  20 tablet  0  . oxyCODONE-acetaminophen (PERCOCET) 7.5-325 MG per tablet Take 2 tablets by mouth every 6 (six) hours as needed for pain.  60 tablet  0    Physical Findings: The patient is in no acute distress. Patient is alert and oriented.  weight is 192 lb (87.091 kg). Her temperature is 97.4 F (36.3 C). Her blood pressure is 116/86 and her pulse is 97. [name]  No palpable cervical or supraclavicular adenopathy. The lungs are clear. The heart has an irregular rhythm consistent with atrial fibrillation. Abdomen is soft and nontender with normal bowel sounds. Examination of the right groin area reveals a partially 3 x 4 cm cystic lesion. This is quite painful with palpation. There is some erythema to the area but no skin erosion or obvious signs of infection  Lab Findings: Lab Results  Component Value  Date   WBC 3.6* 11/09/2011   HGB 12.5 11/09/2011   HCT 37.1 11/09/2011   MCV 86.4 11/09/2011   PLT 181 11/09/2011    @LASTCHEM @  Radiographic Findings: No results found.  Impression:  The patient is recovering from the effects of radiation.  She appears to have had benefitted from her periaortic radiation in terms of pain control. She unfortunately appears to have developed a new problem in the right groin.  Plan:  Patient will be scheduled for CT scan of chest abdomen and pelvis. I am concerned the patient has developed significant adenopathy in the right groin area.  She may be a candidate for palliative treatments to this area. Final treatment decisions will depend on the patient's CT scans.  _____________________________________   Billie Lade, PhD, MD

## 2011-12-24 ENCOUNTER — Ambulatory Visit
Admission: RE | Admit: 2011-12-24 | Discharge: 2011-12-24 | Disposition: A | Discharge status: Home / Self Care | Payer: 59 | Source: Ambulatory Visit | Attending: Radiation Oncology | Admitting: Radiation Oncology

## 2011-12-24 LAB — BASIC METABOLIC PANEL (CC13)
Chloride: 105 mEq/L (ref 98–107)
Glucose: 101 mg/dl — ABNORMAL HIGH (ref 70–99)
Potassium: 4.2 mEq/L (ref 3.5–5.1)
Sodium: 138 mEq/L (ref 136–145)

## 2011-12-28 ENCOUNTER — Telehealth: Payer: Self-pay | Admitting: *Deleted

## 2011-12-28 NOTE — Telephone Encounter (Signed)
Called patient to inform of tests, spoke with patient and she is aware of these tests.

## 2011-12-30 ENCOUNTER — Telehealth: Payer: Self-pay | Admitting: Radiation Oncology

## 2011-12-30 NOTE — Telephone Encounter (Signed)
Sent FUP 12/23/11 to [facility], fax [phone].  OK per JDK.

## 2011-12-31 ENCOUNTER — Other Ambulatory Visit: Payer: Self-pay | Admitting: Radiation Oncology

## 2011-12-31 ENCOUNTER — Ambulatory Visit (HOSPITAL_COMMUNITY)
Admission: RE | Admit: 2011-12-31 | Discharge: 2011-12-31 | Disposition: A | Discharge status: Home / Self Care | Payer: 59 | Source: Ambulatory Visit | Attending: Radiation Oncology | Admitting: Radiation Oncology

## 2011-12-31 DIAGNOSIS — C539 Malignant neoplasm of cervix uteri, unspecified: Secondary | ICD-10-CM | POA: Insufficient documentation

## 2011-12-31 DIAGNOSIS — R599 Enlarged lymph nodes, unspecified: Secondary | ICD-10-CM | POA: Insufficient documentation

## 2011-12-31 DIAGNOSIS — C787 Secondary malignant neoplasm of liver and intrahepatic bile duct: Secondary | ICD-10-CM | POA: Insufficient documentation

## 2011-12-31 MED ORDER — IOHEXOL 300 MG/ML  SOLN
100.0000 mL | Freq: Once | INTRAMUSCULAR | Status: AC | PRN
  Administered 2011-12-31: 100 mL via INTRAVENOUS

## 2012-01-05 ENCOUNTER — Telehealth: Payer: Self-pay

## 2012-01-05 NOTE — Telephone Encounter (Signed)
Patient had questions regarding skin care of irritation of groin.No drainage or peeling.To continue to apply baby ointment 3 times daily or may try neosporin.Told to keep area cleaned at least three time daily and wear cotton underwear.Patient also inquired about ct pelvis results.Results printed and given to Dr.Kinard, he will call patient and speak with her via phone.

## 2012-01-07 ENCOUNTER — Other Ambulatory Visit: Payer: Self-pay | Admitting: Oncology

## 2012-01-07 ENCOUNTER — Telehealth: Payer: Self-pay

## 2012-01-07 ENCOUNTER — Telehealth: Payer: Self-pay | Admitting: *Deleted

## 2012-01-07 ENCOUNTER — Telehealth: Payer: Self-pay | Admitting: Oncology

## 2012-01-07 DIAGNOSIS — C539 Malignant neoplasm of cervix uteri, unspecified: Secondary | ICD-10-CM

## 2012-01-07 NOTE — Telephone Encounter (Signed)
s/w pt and she is aware of her appt time on 11/11    aom

## 2012-01-07 NOTE — Telephone Encounter (Signed)
[name] called to let Dr. Darrold Span know that Dr. Roselind Messier would like for [name] to see Dr. Darrold Span asap.  She had a CT Abdomen on 12-31-11.  She has progressive disease and liver mets. Dr. Roselind Messier informed the patient of the Ct results per [facility]. [name] that this information will be given to Dr. Darrold Span.

## 2012-01-07 NOTE — Telephone Encounter (Signed)
Called Dr. Precious Reel  nurse Sallye Ober and spoke with her regarding Aleksia Freiman, she is to speak with Dr. Darrold Span and get this appt. Scheduled.

## 2012-01-10 ENCOUNTER — Other Ambulatory Visit (HOSPITAL_BASED_OUTPATIENT_CLINIC_OR_DEPARTMENT_OTHER): Payer: 59 | Admitting: Lab

## 2012-01-10 ENCOUNTER — Encounter: Payer: Self-pay | Admitting: Oncology

## 2012-01-10 ENCOUNTER — Encounter: Payer: Self-pay | Admitting: *Deleted

## 2012-01-10 ENCOUNTER — Ambulatory Visit (HOSPITAL_BASED_OUTPATIENT_CLINIC_OR_DEPARTMENT_OTHER): Payer: 59 | Admitting: Oncology

## 2012-01-10 ENCOUNTER — Telehealth: Payer: Self-pay | Admitting: Oncology

## 2012-01-10 VITALS — BP 117/83 | HR 87 | Temp 97.0°F | Resp 20 | Ht 61.5 in | Wt 188.0 lb

## 2012-01-10 DIAGNOSIS — C787 Secondary malignant neoplasm of liver and intrahepatic bile duct: Secondary | ICD-10-CM

## 2012-01-10 DIAGNOSIS — C539 Malignant neoplasm of cervix uteri, unspecified: Secondary | ICD-10-CM

## 2012-01-10 DIAGNOSIS — I82409 Acute embolism and thrombosis of unspecified deep veins of unspecified lower extremity: Secondary | ICD-10-CM

## 2012-01-10 DIAGNOSIS — Z23 Encounter for immunization: Secondary | ICD-10-CM

## 2012-01-10 DIAGNOSIS — D487 Neoplasm of uncertain behavior of other specified sites: Secondary | ICD-10-CM

## 2012-01-10 LAB — COMPREHENSIVE METABOLIC PANEL (CC13)
ALT: 26 U/L (ref 0–55)
Albumin: 3.7 g/dL (ref 3.5–5.0)
CO2: 25 mEq/L (ref 22–29)
Calcium: 10.3 mg/dL (ref 8.4–10.4)
Chloride: 106 mEq/L (ref 98–107)
Sodium: 138 mEq/L (ref 136–145)
Total Protein: 7.1 g/dL (ref 6.4–8.3)

## 2012-01-10 LAB — CBC WITH DIFFERENTIAL/PLATELET
Eosinophils Absolute: 0.2 10*3/uL (ref 0.0–0.5)
HCT: 36 % (ref 34.8–46.6)
LYMPH%: 15 % (ref 14.0–49.7)
MCHC: 32.8 g/dL (ref 31.5–36.0)
MCV: 86.1 fL (ref 79.5–101.0)
MONO#: 0.5 10*3/uL (ref 0.1–0.9)
MONO%: 9.1 % (ref 0.0–14.0)
NEUT#: 3.6 10*3/uL (ref 1.5–6.5)
NEUT%: 72.5 % (ref 38.4–76.8)
Platelets: 207 10*3/uL (ref 145–400)
WBC: 4.9 10*3/uL (ref 3.9–10.3)

## 2012-01-10 LAB — APTT: aPTT: 43 seconds — ABNORMAL HIGH (ref 24–37)

## 2012-01-10 MED ORDER — INFLUENZA VIRUS VACC SPLIT PF IM SUSP
0.5000 mL | Freq: Once | INTRAMUSCULAR | Status: AC
  Administered 2012-01-10: 0.5 mL via INTRAMUSCULAR
  Filled 2012-01-10: qty 0.5

## 2012-01-10 NOTE — Telephone Encounter (Signed)
appts made and printed for pt aom °

## 2012-01-10 NOTE — Progress Notes (Signed)
OFFICE PROGRESS NOTE   01/10/2012   Physicians: J.Kinard, V.Leschber, D.ClarkePearson, J.Allred, D.Margarita Grizzle, H.Derrell Lolling.  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 She has had a complicated course with multiple types of interventions since diagnosis in 2005.[name]   Patient underwent a radical hysterectomy bilateral salpingo-oophorectomy and pelvic lymphadenectomy in March of 2005 by Dr Ronita Hipps for a stage IB cervical cancer. Final pathology showed no residual disease and all lymph nodes were negative.  The patient was followed for 5 years with no evidence of recurrent disease until she developed a recurrence of the right pelvic sidewall in February of 2010. This caused obstruction of the right ureter. She was treated with radiation therapy and concurrent cisplatin chemotherapy.  In late 2011 the patient was found to have recurrence of the right inguinal lymph node and left pelvic nodes and received additional radiation therapy to those areas. This radiation was completed in January 2012. In April of 2012 the patient had resolution of the right inguinal node. In August 2012 the patient was found to have a liver lesion assuring 2.8 cm and received 6 cycles of Taxol + initial carboplatin with that course. She had a partial response to chemo, prior to liver lesion increasing in size (1.9 cm). Reimaging showed that there were no other lesions except the liver and RFA was performed on 10/07/2011. 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 After ~ 3 months she was changed to xarelto by Dr Felicity Coyer because of local bruising and discomfort from the lovenox injections; she  continues xarelto now. She had progression in left periaortic nodes which was symptomatic with pain and treated by Dr Roselind Messier with 5040 cGY in 28 fractions from 10-11-11 thru 11-19-11. Pain has improved since radiation and she has been able to decrease pain medication to present oycontin 20 mg q 12 hrs with prn percocet. CT CAP repeated 12-31-11  multiple new liver lesions, small left inguinal nodes and a 5 cm fluid collection right groin. She saw Dr Yolande Jolly last July 2013.  Other significant comorbidities include atrophic left kidney (previously stented without improvement), atrial fibrillation and HTN.  Patient has had increasing mass right groin x 6-8 weeks, which is now very uncomfortable with any friction including walking and sitting; she has had no trauma and no bleeding or drainage there.She has very little energy, has been out of work on short term disability since 10-14-11 and very likely will not be able to return to work, which we have discussed now. LLE is no longer painful and swelling is much improved over what she had initially with the recurrent DVT. Pain prior to RT had been LUQ thru to back requiring oxycontin 40 mg q 8 hrs; no pain now except right groin. No bleeding. SOB with significant exertion only. Occasional minimal cough with allergies only. Still a lot of nausea, uses SL ativan 2-4x weekly. Previous constipation with higher pain meds, now had diarrhea once a few days ago and again today 0300. Has had temp up to 99.2 in past week, states normal temp for her 97. She has seen no one except Dr Roselind Messier for the right groin mass. Remainder of 10  point Review of Systems negative.  She does not have PAC. Objective:  Vital signs in last 24 hours:  BP 117/83  Pulse 87  Temp 97 F (36.1 C) (Oral)  Resp 20  Ht 5' 1.5" (1.562 m)  Wt 188 lb (85.276 kg)  BMI 34.95 kg/m2  Weight was 192 at Dr Trina Ao visit late Oct. Ambulatory without difficulty other than discomfort in right groin.  Respirations not labored RA. Good historian, just delightful as always, sister very supportive.  HEENT:PERRLA, sclera clear, anicteric, oropharynx clear, no lesions and neck supple with midline trachea. Normal hair. LymphaticsCervical, supraclavicular, and axillary nodes normal. Resp: clear to auscultation bilaterally and normal percussion bilaterally Cardio: irregularly irregular rhythm GI: soft, non-tender; bowel sounds normal; no masses,  no organomegaly appreciable, No rub over liver and not tender RUQ. Right groin has very superficial 5-7 cm soft protuberant mass which seems fluid filled, some mild erythema and tender. No palpable adenopathy left inguinal. Extremities: 1-2+ edema LLE not tight, no cords or tenderness. No edema, cords, tenderness RLE or UE Neuro:nonfocal No central catheter Skin without bruises or ecchymoses  Right inguinal mass was seen also by gyn oncology RN. Lab Results:  Results for orders placed in visit on 01/10/12  CBC WITH DIFFERENTIAL      Component Value Range   WBC 4.9  3.9 - 10.3 10e3/uL   NEUT# 3.6  1.5 - 6.5 10e3/uL   HGB 11.8  11.6 - 15.9 g/dL   HCT 16.1  09.6 - 04.5 %   Platelets 207  145 - 400 10e3/uL   MCV 86.1  79.5 - 101.0 fL   MCH 28.2  25.1 - 34.0 pg   MCHC 32.8  31.5 - 36.0 g/dL   RBC 4.09  8.11 - 9.14 10e6/uL   RDW 14.9 (*) 11.2 - 14.5 %   lymph# 0.7 (*) 0.9 - 3.3 10e3/uL   MONO# 0.5  0.1 - 0.9 10e3/uL   Eosinophils Absolute 0.2  0.0 - 0.5 10e3/uL   Basophils Absolute 0.0  0.0 - 0.1 10e3/uL   NEUT% 72.5  38.4 - 76.8 %   LYMPH% 15.0  14.0 - 49.7 %   MONO% 9.1  0.0 - 14.0 %   EOS% 3.0  0.0 - 7.0 %   BASO% 0.4  0.0 - 2.0 %  PROTIME-INR      Component Value Range   Protime 15.6 (*) 10.6 - 13.4 Seconds   INR 1.30 (*) 2.00 - 3.50   Lovenox No    COMPREHENSIVE METABOLIC PANEL (CC13)      Component Value Range   Sodium 138  136 - 145 mEq/L   Potassium 3.9  3.5 - 5.1 mEq/L   Chloride 106  98 - 107 mEq/L   CO2 25  22 - 29 mEq/L    Glucose 105 (*) 70 - 99 mg/dl   BUN 78.2  7.0 - 95.6 mg/dL   Creatinine 0.9  0.6 - 1.1 mg/dL   Total Bilirubin 2.13  0.20 - 1.20 mg/dL   Alkaline Phosphatase 199 (*) 40 - 150 U/L   AST 20  5 - 34 U/L   ALT 26  0 - 55 U/L   Total Protein 7.1  6.4 - 8.3 g/dL   Albumin 3.7  3.5 - 5.0 g/dL   Calcium 08.6  8.4 - 57.8 mg/dL     Studies/Results: CT CHEST 12-31-11  Findings: The chest wall is unremarkable and stable. No breast  masses, supraclavicular or axillary lymphadenopathy. Small  scattered thyroid nodules are noted. The bony thorax is intact.  No destructive bone lesions or spinal canal compromise.  The heart is normal in size. No pericardial effusion. No  mediastinal or hilar lymphadenopathy. The aorta is normal in  caliber. No dissection. The esophagus is normal.  Examination of the lung parenchyma demonstrates no acute pulmonary  findings. No pulmonary nodules or pleural effusion. The  tracheobronchial tree is unremarkable.  IMPRESSION:  Unremarkable and stable CT appearance of the chest. No findings  for metastatic disease.  CT ABDOMEN AND PELVIS  Findings: There are multiple new metastatic hepatic lesions.  Stable appearance of the prior RFA site in the right lobe.  The spleen is normal. The pancreas is normal. The adrenal glands  are normal and stable. The left kidney is small and chronically  obstructed and is likely involved with tumor extending from the  left sided retroperitoneal adenopathy which is progressive. The  right kidney demonstrates compensatory hypertrophy but no focal  lesions.  The stomach, duodenum, small bowel and colon are unremarkable. No  mass lesions or inflammatory changes.  The aorta is normal in caliber. The major branch vessels are  normal except the left renal artery which is extremely small and  surrounded by tumor.  Stable surgical changes in the pelvis. No pelvic mass or  lymphadenopathy. Stable fluid collection in the left obturator    region along with a few small lymph nodes. There are multiple  small left inguinal lymph nodes. In the right groin area there is  a 5 cm fluid collection which could be a postoperative seroma or  liquefied hematoma. This has enlarged since the prior PET scan  from July 2013.  IMPRESSION:  1. Multiple new hepatic metastasis.  2. Progressive left-sided retroperitoneal lymphadenopathy now  involving the left kidney.  3. Pelvic and inguinal lymph nodes.  4. Enlarging right inguinal fluid collection.  Original Report Authenticated By: Rudie Meyer, M.D   Medications: I have reviewed the patient's current medications. We will refill Percocet and Ativan. She received flu vaccine now. She will hold xarelto tonight as we have been able to schedule her to see general surgeon tomorrow re the right inguinal mass.  Patient understands that systemic chemotherapy is best intervention for multiple hepatic mets now, tho this will not be curative. As we need to address the acute problem with right groin now, I will see her back in next week or so to discuss chemotherapy treatment options in detail. She does tell me that she would be agreeable to trying more chemo. She probably will need PAC as venous access has been difficult for some time (not discussed today)  Assessment/Plan: 1.Progressive metastatic squamous cell carcinoma of cervix: history as above, now mutliple hepatic mets. I will see her back 01-17-12 and expect to set up chemotherapy from there 2.recurrent LLE DVT now with IVC filter in and on xarelto. Note she rapidly reclotted the leg when anticoagulation was held around RFA. 3.nonfunctional left kidney and some right hydronephrosis, known to urology. Creatinine good today 4.atrial fibrillation 5.difficult peripheral IV access. 6.enlarging and painful right inguinal mass which appears to be fluid filled on recent CT. Prompt availability by general surgery much appreciated.  Time spent >40  Min  including > 50% in discussion and coordination of care.  Patient and sister were in agreement with plan above and had questions answered to their satisfaction.  LIVESAY,LENNIS P, MD   01/10/2012, 5:05 PM

## 2012-01-10 NOTE — Patient Instructions (Signed)
We will get appointment fixed for general surgeon or gyn oncology, for someone to see you this week.  Flu shot given today  We will go over the chemotherapy possibilities at appointment next week.

## 2012-01-10 NOTE — Progress Notes (Signed)
Per request Dr Darrold Span appt made for pt to see Dr Derrell Lolling tomorrow, 01/11/12 @ 1515 for evaluation of right inguinal fluid collection. Records and scans in Epic

## 2012-01-11 ENCOUNTER — Encounter (INDEPENDENT_AMBULATORY_CARE_PROVIDER_SITE_OTHER): Payer: Self-pay | Admitting: General Surgery

## 2012-01-11 ENCOUNTER — Ambulatory Visit (INDEPENDENT_AMBULATORY_CARE_PROVIDER_SITE_OTHER): Payer: 59 | Admitting: General Surgery

## 2012-01-11 ENCOUNTER — Other Ambulatory Visit: Payer: Self-pay | Admitting: *Deleted

## 2012-01-11 VITALS — BP 142/86 | HR 74 | Temp 97.8°F | Resp 18 | Ht 61.5 in | Wt 187.1 lb

## 2012-01-11 DIAGNOSIS — L02219 Cutaneous abscess of trunk, unspecified: Secondary | ICD-10-CM

## 2012-01-11 DIAGNOSIS — L02214 Cutaneous abscess of groin: Secondary | ICD-10-CM

## 2012-01-11 NOTE — Patient Instructions (Addendum)
We made a 1 inch incision in the right groin abscess today. The open wound was packed loosely with gauze.  Keep a bulky dry bandage on this because it will drain a moderate amount of watery fluid.  You may take a shower once or twice a day, but after the shower replace the bandage on the outside.  Leave the packing in place until Friday or Saturday, and then you may pull it out and throw it away.  Take the antibiotics as prescribed  Used your pain medicine if you need to.  Return to see Dr. Derrell Lolling in approximately one week.

## 2012-01-11 NOTE — Progress Notes (Signed)
Patient ID: [name], female   DOB: 09-Oct-1950, 61 y.o.   MRN: [id number]  Chief Complaint  Patient presents with  . Follow-up    Painful inguinal fluid collection, possible abscess    HPI [name] is a 61 y.o. female.  She is referred by Dr. Rodena Medin for evaluation and management of a painful right inguinal fluid collection.  This patient has cervical cancer. It has recurred. She is being treated by the GYN oncologist, Dr. Darrold Span, and Dr. Roselind Messier. She is also on xaralto for atrial fib. and deep venous thrombosis. She stopped taking this 2 days ago. She has chronic diastolic heart failure and hypertension.  She had radiation therapy to her abdomen and right groin which just ended on September 30. They've been following a fluid collection in the right groin. It is visible on CT scan. It is now becoming larger more painful and red.  CT scan shows a very thin wall fluid collection just under the skin. There is no communication with the abdominal cavity. There is no evidence of hernia. I suspect this may be an infected lymphocele from necrotic right inguinal lymph nodes. HPI  Past Medical History  Diagnosis Date  . Diastolic dysfunction   . MIGRAINE HEADACHE   . Atrial fibrillation     failed DCCV, CHADs2-prev pradaxa stopped due to hematuria with ureter issues/JJ   . Anemia   . HTN (hypertension)   . GLAUCOMA   . CARPAL TUNNEL SYNDROME, RIGHT   . Lymphedema     L>R leg from pelvic XRT/scarring  . Hepatic steatosis     CT scan 06/2009, 12/2009  . DVT (deep venous thrombosis) 10/2010    LLE, anticaog resumed  . CHF (congestive heart failure)     1 yr ago per pt  . Dyspnea on exertion   . Arthritis   . GERD (gastroesophageal reflux disease)   . Lesion of liver   . Small kidney     left  . Difficulty sleeping   . Radiation 10/11/11-11/19/11    5040 cGy in 28 fx's/periaortic  . Radiation 03/16/10-03/25/10    right inguinal node/left external iliac node  . Radiation  06/11/08-07/23/08    6000 cGy left pelvis  . Clotting disorder   . Hyperlipidemia   . Leg swelling   . Nausea   . Bruises easily   . Weakness   . Cervical cancer dx 04/2003, recur 04/2008    s/p debulk (TAHBSO), chemo/XRT; recurrent dz L pelvis dx 2010 and liver met 09/2010 s/p RFA  . Recurrent cervical cancer     Past Surgical History  Procedure Date  . Ureteral stent placement 2005, 01/2010, 07/2010    L hydro related to cervical ca and XRT  . Oophorectomy 2005  . Cardioversion   . Tonsillectomy   . Cholecystectomy 1984  . Total abdominal hysterectomy 2005  . Carpal tunnel 2009    right  . Ivc filter 04/2011    Family History  Problem Relation Age of Onset  . Lung cancer    . Hypertension    . Heart disease    . Stroke    . Asthma    . Asthma Son   . Cancer Mother     lung  . Stroke Father     Social History History  Substance Use Topics  . Smoking status: Former Smoker -- 0.5 packs/day for 3 years    Quit date: 03/01/1984  . Smokeless tobacco: Never Used  .  Alcohol Use: No    Allergies  Allergen Reactions  . Codeine     REACTION: Breathing issues and rash 40 years ago  . Flecainide Acetate   . Morphine     REACTION: Hallucinations  . Propafenone Hcl     LEG CRAMPS    Current Outpatient Prescriptions  Medication Sig Dispense Refill  . ibuprofen (ADVIL,MOTRIN) 200 MG tablet Take 400-600 mg by mouth every 6 (six) hours as needed. Pain      . LORazepam (ATIVAN) 1 MG tablet Take 1 mg by mouth every 4 (four) hours.      . metoprolol (LOPRESSOR) 50 MG tablet Take 1 tablet (50 mg total) by mouth 2 (two) times daily.  60 tablet  5  . Multiple Vitamin (MULITIVITAMIN WITH MINERALS) TABS Take 1 tablet by mouth daily.      [name] omeprazole (PRILOSEC) 20 MG capsule Take 20 mg by mouth daily.       . ondansetron (ZOFRAN-ODT) 8 MG disintegrating tablet Take 8 mg by mouth every 8 (eight) hours as needed.      [name] oxyCODONE-acetaminophen (PERCOCET) 10-325 MG per tablet Take 1  tablet by mouth every 6 (six) hours as needed.      . Rivaroxaban (XARELTO) 20 MG TABS Take 20 mg by mouth daily.  30 tablet  5  . sodium chloride (OCEAN) 0.65 % nasal spray Place 1 spray into the nose as needed. Allergies        Review of Systems Review of Systems  Constitutional: Negative for fever, chills and unexpected weight change.  HENT: Negative for hearing loss, congestion, sore throat, trouble swallowing and voice change.   Eyes: Negative for visual disturbance.  Respiratory: Negative for cough and wheezing.   Cardiovascular: Negative for chest pain, palpitations and leg swelling.  Gastrointestinal: Negative for nausea, vomiting, abdominal pain, diarrhea, constipation, blood in stool, abdominal distention and anal bleeding.  Genitourinary: Negative for hematuria, vaginal bleeding and difficulty urinating.  Musculoskeletal: Negative for arthralgias.  Skin: Positive for color change. Negative for rash and wound.  Neurological: Negative for seizures, syncope and headaches.  Hematological: Negative for adenopathy. Does not bruise/bleed easily.  Psychiatric/Behavioral: Negative for confusion.    Blood pressure 142/86, pulse 74, temperature 97.8 F (36.6 C), temperature source Temporal, resp. rate 18, height 5' 1.5" (1.562 m), weight 187 lb 2 oz (84.879 kg).  Physical Exam Physical Exam  Constitutional: She is oriented to person, place, and time. She appears well-developed and well-nourished. No distress.  HENT:  Head: Normocephalic and atraumatic.  Eyes: EOM are normal. Pupils are equal, round, and reactive to light.  Abdominal: Soft. Bowel sounds are normal. She exhibits no distension.  Musculoskeletal: She exhibits no edema and no tenderness.  Neurological: She is alert and oriented to person, place, and time. She exhibits normal muscle tone. Coordination normal.  Skin: Skin is warm and dry. No rash noted. She is not diaphoretic. There is erythema. No pallor.       3-4 cm  erythematous, ballotable fluid collection right inguinal crease. Skin very thin overlying this. Very compressible. Procedure involved Betadine prep, 1% Xylocaine with epinephrine, 1 inch incision. This  appeared to be a lymphocele with  serous fluid. There was no odor. It was cultured. It may be infected or it may not . It was packed with iodoform gauze. Tol. Well.    Data Reviewed I reviewed her last 3 CT scans. I reviewed Dr. Precious Reel office notes. I discussed her case and history with Harriett Sine  Jearl Klinefelter, R.N., of the GYN oncology department.  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 Hypertension History DVT Chronic diastolic heart failure Anxiety and depression    Plan    Wound care discussed and written instructions given. Shower daily. Redress as needed. Remove packing this weekend, sooner if necessary. Check culture report Augmentin 875 mg twice a day x7 days.   She has Percocet at home May restart Lesia Hausen  tomorrow Return to see me one week       Torrie Lafavor M. Derrell Lolling, M.D., [facility] General and Minimally invasive Surgery Breast and Colorectal Surgery Office:   [phone] Pager:   [phone]  01/11/2012, 4:58 PM

## 2012-01-11 NOTE — Telephone Encounter (Signed)
review 

## 2012-01-12 ENCOUNTER — Other Ambulatory Visit: Payer: Self-pay

## 2012-01-12 DIAGNOSIS — C539 Malignant neoplasm of cervix uteri, unspecified: Secondary | ICD-10-CM

## 2012-01-12 MED ORDER — LORAZEPAM 1 MG PO TABS: ORAL_TABLET | ORAL | Status: DC

## 2012-01-12 MED ORDER — OXYCODONE-ACETAMINOPHEN 10-325 MG PO TABS: ORAL_TABLET | ORAL | Status: DC

## 2012-01-15 LAB — WOUND CULTURE

## 2012-01-17 ENCOUNTER — Other Ambulatory Visit: Payer: Self-pay

## 2012-01-17 ENCOUNTER — Telehealth: Payer: Self-pay | Admitting: *Deleted

## 2012-01-17 ENCOUNTER — Ambulatory Visit (HOSPITAL_BASED_OUTPATIENT_CLINIC_OR_DEPARTMENT_OTHER): Payer: 59 | Admitting: Oncology

## 2012-01-17 ENCOUNTER — Encounter: Payer: Self-pay | Admitting: Oncology

## 2012-01-17 VITALS — BP 125/75 | HR 86 | Temp 97.3°F | Resp 20 | Ht 61.5 in | Wt 190.5 lb

## 2012-01-17 DIAGNOSIS — C539 Malignant neoplasm of cervix uteri, unspecified: Secondary | ICD-10-CM

## 2012-01-17 DIAGNOSIS — C50919 Malignant neoplasm of unspecified site of unspecified female breast: Secondary | ICD-10-CM

## 2012-01-17 DIAGNOSIS — I82409 Acute embolism and thrombosis of unspecified deep veins of unspecified lower extremity: Secondary | ICD-10-CM

## 2012-01-17 DIAGNOSIS — C779 Secondary and unspecified malignant neoplasm of lymph node, unspecified: Secondary | ICD-10-CM

## 2012-01-17 DIAGNOSIS — C787 Secondary malignant neoplasm of liver and intrahepatic bile duct: Secondary | ICD-10-CM

## 2012-01-17 MED ORDER — DEXAMETHASONE 4 MG PO TABS: ORAL_TABLET | ORAL | Status: DC

## 2012-01-17 MED ORDER — LIDOCAINE-PRILOCAINE 2.5-2.5 % EX CREA: TOPICAL_CREAM | CUTANEOUS | Status: DC | PRN

## 2012-01-17 MED ORDER — ONDANSETRON HCL 8 MG PO TABS: 8.0000 mg | ORAL_TABLET | Freq: Two times a day (BID) | ORAL | Status: DC

## 2012-01-17 NOTE — Progress Notes (Signed)
OFFICE PROGRESS NOTE   01/17/2012   Physicians: J.Kinard, V.Leschber, D.ClarkePearson, J.Allred, D.Margarita Grizzle, H.Derrell Lolling.   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 She had the superficial right inguinal fluid collection incised and drained by Dr Derrell Lolling on 01-11-12, serous fluid without odor. Culture was sent and is negative to date. The area continues to drain clear fluid, requiring dressing changes several times daily. She is to see Dr Derrell Lolling next on 01-19-12.  Oncologic history is of radical hysterectomy, bilateral salpingo-oophorectomy and pelvic lymphadenectomy in March of 2005 by Dr Ronita Hipps for a stage IB cervical cancer. Final pathology showed no residual disease and all lymph nodes were negative. The patient was followed for 5 years with no evidence of recurrent disease until she developed a recurrence of the right pelvic sidewall in February of 2010. This caused obstruction of the right ureter. She was treated with radiation therapy and concurrent cisplatin chemotherapy. In late 2011 she had recurrence in right inguinal lymph node and left pelvic nodes and received additional radiation therapy to those areas, completed in January 2012. In August 2012 the patient was found to have a 2.8 cm liver lesion and received 6 cycles of Taxol + one cycle of carboplatin. She had a partial response to chemo, then increase in the solitary liver lesion and RFA was performed on 10/07/2011. 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 After ~ 3 months she was changed to xarelto by Dr Felicity Coyer because of local bruising and discomfort from the lovenox injections; she continues xarelto now. She had  progression in left periaortic nodes which was symptomatic with pain and treated by Dr Roselind Messier with 5040 cGY in 28 fractions from 10-11-11 thru 11-19-11. Pain has improved since radiation and she has been able to decrease pain medication to present oycontin 20 mg q 12 hrs with prn percocet. CT CAP repeated 12-31-11 found multiple new liver lesions, small left inguinal nodes and a 5 cm fluid collection right groin. She saw Dr Yolande Jolly last July 2013; I have let gyn oncology know situation now.  Other significant comorbidities include atrophic left kidney (previously stented without improvement), atrial fibrillation and HTN.  Nausea has improved since her visit last week, likely with further recovery from recent RT, and she has improved appetite. She is sleeping poorly, usually awake for hours during night, without specific discomfort causing this. She has neurontin at home which made her drowsy when this was used for pain; she will try neurontin tonight and let me know if this is not sufficient for sleep also. Bowels are moving, no bleeding, no increased swelling LE, no increased SOB, no new or different pain. Discomfort at right groin is less, ongoing drainage as above without bleeding, no fever. Remainder of 10 point Review of Systems negative.  We have discussed resuming chemotherapy for the progressive disease in liver particularly. Although we had avoided carboplatin previously due to renal problems, this may well be useful now, with careful dosing, with weekly taxol ~ d1,d8 q 21 days.She will need PAC and agrees with referral to IR for this; she may need coverage with lovenox depending on amount of time off xarelto, and she will let me know what lovenox she still has available at home.  Objective:  Vital signs in last 24 hours:  BP 125/75  Pulse 86  Temp 97.3 F (36.3 C) (Oral)  Resp 20  Ht 5' 1.5" (1.562 m)  Wt 190 lb 8 oz (86.41 kg)  BMI 35.41 kg/m2 Looks more comfortable, ambulatory more  easily, respirations not labored RA.     HEENT:PERRLA, sclera clear, anicteric, oropharynx clear, no lesions and neck supple with midline trachea LymphaticsCervical, supraclavicular, and axillary nodes normal. Resp: clear to auscultation bilaterally and normal percussion bilaterally Cardio: regular rate and rhythm GI: soft, non-tender; bowel sounds normal; no masses,  no organomegaly Extremities: 1+ swelling LLE without cords or tenderness Right inguinal area with dressing in place, some clear drainage, no surrounding erythema or marked tenderness. Neuro:no sensory deficits noted Skin without rash or ecchymosis  Lab Results:  Results for orders placed in visit on 01/11/12  WOUND CULTURE      Component Value Range   GRAM STAIN Few     GRAM STAIN WBC present-predominately Mononuclear     GRAM STAIN No Squamous Epithelial Cells Seen     GRAM STAIN No Organisms Seen     Organism ID, Bacteria NO GROWTH 2 DAYS       Studies/Results:  No results found.  Medications: I have reviewed the patient's current medications. She has had flu vaccine. She will use decadron 20 mg 12 hrs prior to the low dose taxol  Assessment/Plan: 1. 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 I will be sure Dr Yolande Jolly is in agreement. I will see her back shortly prior to day 8 taxol. 2.right inguinal fluid collection: continues to drain but less uncomfortable. Appreciate Dr Jacinto Halim assistance 3.atrial fibrillation on xarelto 4.recurrent LLE DVT: IVC filter in and on xarelto. Needs coverage with lovenox if has to be off xarelto for any extended time  5.nonfunctioning left kidney and some right hydronephrosis, known to urology 6.poor peripheral IV access: for [facility]  Patient was comfortable with discussion and plan as above.  LIVESAY,LENNIS P, MD   01/17/2012, 1:20  PM

## 2012-01-17 NOTE — Patient Instructions (Signed)
Please let Dr Precious Reel RN know how many lovenox injections/ what dose you have available. Interventional radiology will let us know how long they want you off of xarelto/ we will cover with lovenox   We will send in prescriptions for decadron 4mg   Five tablets with food 12 hrs before taxol And for numbing cream (EMLA, Ellamax) to put onto portacath before it is accessed. You can put the cream on ~ 30 min to an hour or so before accessed, then cover with small piece of Saran Wrap to keep it off of clothes.  We will finish scheduling MD visits when we know date of portacath and chemo

## 2012-01-17 NOTE — Telephone Encounter (Signed)
Per staff message and POF I have scheduled appts.  JMW  

## 2012-01-18 ENCOUNTER — Encounter (HOSPITAL_COMMUNITY): Payer: Self-pay | Admitting: Pharmacy Technician

## 2012-01-18 ENCOUNTER — Other Ambulatory Visit: Payer: Self-pay | Admitting: Radiology

## 2012-01-18 ENCOUNTER — Telehealth: Payer: Self-pay

## 2012-01-18 NOTE — Telephone Encounter (Signed)
[name] stated that she has Lovenox 100 mg/ml # 12 syringes at home. She is having PAC placed on Thursday 01-20-12 at 1330.  She was instructed to hold the Xarelto on Wednesday and can resume it on Thursday after the procedure.

## 2012-01-18 NOTE — Telephone Encounter (Signed)
Confirmed instructions with [name] in [place] IR that the Xarelto is to be held 24 hours pre-procedure.  She just needs to hold the Xarelto on Wed. 01-19-12 as instructed and resume after the procedure 01-20-12 per Dr. Darrold Span.  No Lovenox coverage needed. [name] verbalized understanding.

## 2012-01-19 ENCOUNTER — Telehealth: Payer: Self-pay | Admitting: Oncology

## 2012-01-19 ENCOUNTER — Ambulatory Visit: Payer: 59 | Admitting: Oncology

## 2012-01-19 ENCOUNTER — Encounter (INDEPENDENT_AMBULATORY_CARE_PROVIDER_SITE_OTHER): Payer: Self-pay | Admitting: General Surgery

## 2012-01-19 ENCOUNTER — Ambulatory Visit (INDEPENDENT_AMBULATORY_CARE_PROVIDER_SITE_OTHER): Payer: 59 | Admitting: General Surgery

## 2012-01-19 ENCOUNTER — Telehealth (INDEPENDENT_AMBULATORY_CARE_PROVIDER_SITE_OTHER): Payer: Self-pay | Admitting: General Surgery

## 2012-01-19 VITALS — BP 146/82 | HR 74 | Temp 97.8°F | Resp 16 | Ht 61.5 in | Wt 188.2 lb

## 2012-01-19 DIAGNOSIS — L02214 Cutaneous abscess of groin: Secondary | ICD-10-CM

## 2012-01-19 DIAGNOSIS — L02219 Cutaneous abscess of trunk, unspecified: Secondary | ICD-10-CM

## 2012-01-19 NOTE — Telephone Encounter (Signed)
unable to reach pt at home or work #s,unable to leave a message either

## 2012-01-19 NOTE — Telephone Encounter (Signed)
appts made,unable to reach pt,note placed in IR sch for pt to see [place] for a sch

## 2012-01-19 NOTE — Progress Notes (Signed)
Patient ID: [name], female   DOB: 1951-01-05, 61 y.o.   MRN: [id number] History: This pleasant woman has metastatic cervical cancer. She underwent radiation therapy to her right groin for metastatic adenopathy. On November 12 I  performed incision and drainage of a fluid collection which was tender and erythematous. Culture is negative. The drainage has rapidly declined. The pain has rapidly declined. She feels better. She says she's going to start chemotherapy in the next week or 2.  Exam: Patient looks well. No distress Right groin wound shows a 1.5 cm open wound with a small shallow cavity. No erythema. Much more shallow than it was. No significant drainage.  Assessment: Sterile lymphocele right groin, status post incision and drainage with excellent symptomatic relief of symptoms. Anticipate, most likely, this will completely heal within about 3 weeks Metastatic cervical cancer Histor yXRT right groin  Plan: Wound care discussed. Return to see me if this does not completely heal over in 3 weeks   Nyasha Rahilly M. Derrell Lolling, M.D., [facility] General and Minimally invasive Surgery Breast and Colorectal Surgery Office:   [phone] Pager:   [phone]

## 2012-01-19 NOTE — Patient Instructions (Signed)
The open wound in your right groin appears to be healing rapidly. It is encouraging that the drainage is subsiding so rapidly.  You may shower.  Keep a dry gauze bandage on this wound.  Call Dr. Derrell Lolling if it does not completely heal over in 3 weeks.

## 2012-01-19 NOTE — Telephone Encounter (Signed)
Called patient and advised Dr. Derrell Lolling would be running late due to delay at hospital because of surgeries. Patient stated she is already on her way and would like to be seen due to scheduled port-a-cath insertion tomorrow. Apologized to her for the wait and advised that she will be kept on the schedule. Patient agreed.

## 2012-01-20 ENCOUNTER — Other Ambulatory Visit: Payer: Self-pay | Admitting: Oncology

## 2012-01-20 ENCOUNTER — Encounter (HOSPITAL_COMMUNITY): Payer: Self-pay

## 2012-01-20 ENCOUNTER — Ambulatory Visit (HOSPITAL_COMMUNITY)
Admission: RE | Admit: 2012-01-20 | Discharge: 2012-01-20 | Disposition: A | Discharge status: Home / Self Care | Payer: 59 | Source: Ambulatory Visit | Attending: Oncology | Admitting: Oncology

## 2012-01-20 VITALS — BP 116/62 | HR 85 | Temp 97.7°F | Resp 11

## 2012-01-20 DIAGNOSIS — E785 Hyperlipidemia, unspecified: Secondary | ICD-10-CM | POA: Insufficient documentation

## 2012-01-20 DIAGNOSIS — C539 Malignant neoplasm of cervix uteri, unspecified: Secondary | ICD-10-CM

## 2012-01-20 DIAGNOSIS — I4891 Unspecified atrial fibrillation: Secondary | ICD-10-CM | POA: Insufficient documentation

## 2012-01-20 DIAGNOSIS — K219 Gastro-esophageal reflux disease without esophagitis: Secondary | ICD-10-CM | POA: Insufficient documentation

## 2012-01-20 DIAGNOSIS — I10 Essential (primary) hypertension: Secondary | ICD-10-CM | POA: Insufficient documentation

## 2012-01-20 DIAGNOSIS — Z79899 Other long term (current) drug therapy: Secondary | ICD-10-CM | POA: Insufficient documentation

## 2012-01-20 LAB — CBC WITH DIFFERENTIAL/PLATELET
Basophils Absolute: 0 10*3/uL (ref 0.0–0.1)
Basophils Relative: 0 % (ref 0–1)
Eosinophils Absolute: 0.1 10*3/uL (ref 0.0–0.7)
HCT: 33.9 % — ABNORMAL LOW (ref 36.0–46.0)
Hemoglobin: 11.4 g/dL — ABNORMAL LOW (ref 12.0–15.0)
MCH: 28.8 pg (ref 26.0–34.0)
MCHC: 33.6 g/dL (ref 30.0–36.0)
Monocytes Absolute: 0.5 10*3/uL (ref 0.1–1.0)
Monocytes Relative: 11 % (ref 3–12)
Neutrophils Relative %: 67 % (ref 43–77)
RDW: 14.7 % (ref 11.5–15.5)

## 2012-01-20 MED ORDER — LIDOCAINE HCL 1 % IJ SOLN
INTRAMUSCULAR | Status: AC
  Filled 2012-01-20: qty 20

## 2012-01-20 MED ORDER — MIDAZOLAM HCL 2 MG/2ML IJ SOLN
INTRAMUSCULAR | Status: AC
  Filled 2012-01-20: qty 6

## 2012-01-20 MED ORDER — MIDAZOLAM HCL 2 MG/2ML IJ SOLN
INTRAMUSCULAR | Status: AC | PRN
  Administered 2012-01-20 (×2): 1 mg via INTRAVENOUS
  Administered 2012-01-20: 2 mg via INTRAVENOUS

## 2012-01-20 MED ORDER — SODIUM CHLORIDE 0.9 % IV SOLN
INTRAVENOUS | Status: DC
  Administered 2012-01-20: 14:00:00 via INTRAVENOUS

## 2012-01-20 MED ORDER — FENTANYL CITRATE 0.05 MG/ML IJ SOLN
INTRAMUSCULAR | Status: AC | PRN
  Administered 2012-01-20: 100 ug via INTRAVENOUS
  Administered 2012-01-20 (×2): 50 ug via INTRAVENOUS

## 2012-01-20 MED ORDER — CEFAZOLIN SODIUM-DEXTROSE 2-3 GM-% IV SOLR
2.0000 g | Freq: Once | INTRAVENOUS | Status: AC
  Administered 2012-01-20: 2 g via INTRAVENOUS
  Filled 2012-01-20: qty 50

## 2012-01-20 MED ORDER — FENTANYL CITRATE 0.05 MG/ML IJ SOLN
INTRAMUSCULAR | Status: AC
  Filled 2012-01-20: qty 6

## 2012-01-20 NOTE — H&P (Signed)
Chief Complaint: "I'm here for a portacath" Referring Physician:Livesay HPI: [name] is an 61 y.o. female with metastatic cervical cancer. She is referred for portacath to receive chemotherapy. Notes reviewed from recent inguinal LN excision with sterile wound, seen by Dr. Derrell Lolling yesterday. No concern for acute infection. PMHx and meds reviewed. Pt has been off Xarelto X 2 days.  Past Medical History:  Past Medical History  Diagnosis Date  . Diastolic dysfunction   . MIGRAINE HEADACHE   . Atrial fibrillation     failed DCCV, CHADs2-prev pradaxa stopped due to hematuria with ureter issues/JJ   . Anemia   . HTN (hypertension)   . GLAUCOMA   . CARPAL TUNNEL SYNDROME, RIGHT   . Lymphedema     L>R leg from pelvic XRT/scarring  . Hepatic steatosis     CT scan 06/2009, 12/2009  . DVT (deep venous thrombosis) 10/2010    LLE, anticaog resumed  . CHF (congestive heart failure)     1 yr ago per pt  . Dyspnea on exertion   . Arthritis   . GERD (gastroesophageal reflux disease)   . Lesion of liver   . Small kidney     left  . Difficulty sleeping   . Radiation 10/11/11-11/19/11    5040 cGy in 28 fx's/periaortic  . Radiation 03/16/10-03/25/10    right inguinal node/left external iliac node  . Radiation 06/11/08-07/23/08    6000 cGy left pelvis  . Clotting disorder   . Hyperlipidemia   . Leg swelling   . Nausea   . Bruises easily   . Weakness   . Cervical cancer dx 04/2003, recur 04/2008    s/p debulk (TAHBSO), chemo/XRT; recurrent dz L pelvis dx 2010 and liver met 09/2010 s/p RFA  . Recurrent cervical cancer     Past Surgical History:  Past Surgical History  Procedure Date  . Ureteral stent placement 2005, 01/2010, 07/2010    L hydro related to cervical ca and XRT  . Oophorectomy 2005  . Cardioversion   . Tonsillectomy   . Cholecystectomy 1984  . Total abdominal hysterectomy 2005  . Carpal tunnel 2009    right  . Ivc filter 04/2011    Family History:  Family History    Problem Relation Age of Onset  . Lung cancer    . Hypertension    . Heart disease    . Stroke    . Asthma    . Asthma Son   . Cancer Mother     lung  . Stroke Father     Social History:  reports that she quit smoking about 27 years ago. She has never used smokeless tobacco. She reports that she does not drink alcohol or use illicit drugs.  Allergies:  Allergies  Allergen Reactions  . Codeine     REACTION: Breathing issues and rash 40 years ago  . Flecainide Acetate   . Morphine     REACTION: Hallucinations  . Propafenone Hcl     LEG CRAMPS    Medications: ibuprofen (ADVIL,MOTRIN) 200 MG tablet (Taking) Sig - Route: Take 400-600 mg by mouth every 6 (six) hours as needed. Pain - Oral Class: Historical Med Number of times this order has been changed since signing: 1 Order [address] LORazepam (ATIVAN) 1 MG tablet (Taking) 30 tablet 0 01/10/2012 Sig: Take 1 tab under tongue or swallow every 4-6 hours as needed for nausea. Class: No Print Comment: Prescription given to patient Number of times this order has  been changed since signing: 1 Order [address] metoprolol (LOPRESSOR) 50 MG tablet (Taking) 60 tablet 5 08/06/2011 Sig - Route: Take 1 tablet (50 mg total) by mouth 2 (two) times daily. - Oral Number of times this order has been changed since signing: 1 Order [address] Multiple Vitamin (MULITIVITAMIN WITH MINERALS) TABS (Taking) Sig - Route: Take 1 tablet by mouth daily. - Oral Class: Historical Med omeprazole (PRILOSEC) 20 MG capsule (Taking) Sig - Route: Take 20 mg by mouth daily. - Oral Class: Historical Med Number of times this order has been changed since signing: 3 Order [address] ondansetron (ZOFRAN-ODT) 8 MG disintegrating tablet (Taking) Sig - Route: Take 8 mg by mouth every 8 (eight) hours as needed. nausea - Oral Class: Historical Med Number of times this order has been changed since signing: 2 Order [address] oxyCODONE (OXYCONTIN) 20 MG 12 hr tablet (Taking) 12/07/2011 Sig  - Route: Take 20 mg by mouth every 12 (twelve) hours. - Oral Class: Historical Med Number of times this order has been changed since signing: 1 Order [address] oxyCODONE-acetaminophen (PERCOCET) 10-325 MG per tablet (Taking) 30 tablet 0 01/12/2012 Sig: Take 1 tablet every 4-6 hours as needed for pain Class: No Print Comment: Prescription given to patient Number of times this order has been changed since signing: 1 Order [address] Rivaroxaban (XARELTO) 20 MG TABS (Taking) 30 tablet 5 08/16/2011 Sig - Route: Take 20 mg by mouth daily. - Oral Number of times this order has been changed since signing: 1 Order [address] sodium chloride (OCEAN) 0.65 % nasal spray (Taking)   Please HPI for pertinent positives, otherwise complete 10 system ROS negative.  Physical Exam: Blood pressure 136/86, pulse 80, temperature 97.7 F (36.5 C), SpO2 97.00%. There is no height or weight on file to calculate BMI.   General Appearance:  Alert, cooperative, no distress, appears stated age  Head:  Normocephalic, without obvious abnormality, atraumatic  ENT: Unremarkable  Neck: Supple, symmetrical, trachea midline, no adenopathy, thyroid: not enlarged, symmetric, no tenderness/mass/nodules  Lungs:   Clear to auscultation bilaterally, no w/r/r, respirations unlabored without use of accessory muscles.  Chest Wall:  No tenderness or deformity  Heart:  Regular rate and rhythm, S1, S2 normal, no murmur, rub or gallop. Carotids 2+ without bruit.  Neurologic: Normal affect, no gross deficits.   Results for orders placed during the hospital encounter of 01/20/12 (from the past 48 hour(s))  CBC WITH DIFFERENTIAL     Status: Abnormal   Collection Time   01/20/12 12:46 PM      Component Value Range Comment   WBC 4.6  4.0 - 10.5 K/uL    RBC 3.96  3.87 - 5.11 MIL/uL    Hemoglobin 11.4 (*) 12.0 - 15.0 g/dL    HCT 04.5 (*) 40.9 - 46.0 %    MCV 85.6  78.0 - 100.0 fL    MCH 28.8  26.0 - 34.0 pg    MCHC 33.6  30.0 - 36.0 g/dL     RDW 81.1  91.4 - 78.2 %    Platelets 194  150 - 400 K/uL    Neutrophils Relative 67  43 - 77 %    Neutro Abs 3.0  1.7 - 7.7 K/uL    Lymphocytes Relative 20  12 - 46 %    Lymphs Abs 0.9  0.7 - 4.0 K/uL    Monocytes Relative 11  3 - 12 %    Monocytes Absolute 0.5  0.1 - 1.0  K/uL    Eosinophils Relative 3  0 - 5 %    Eosinophils Absolute 0.1  0.0 - 0.7 K/uL    Basophils Relative 0  0 - 1 %    Basophils Absolute 0.0  0.0 - 0.1 K/uL    No results found.  Assessment/Plan Metastatic cervical cancer For port placement, discussed procedure and risks. Labs pending Consent signed in chart  Brayton El PA-C 01/20/2012, 1:12 PM

## 2012-01-20 NOTE — Procedures (Signed)
Procedure:  Right IJ porta-cath Findings:  Tip at cavoatrial junction.  No PTX.

## 2012-01-20 NOTE — H&P (Signed)
Agree 

## 2012-01-21 ENCOUNTER — Other Ambulatory Visit: Payer: Self-pay

## 2012-01-21 DIAGNOSIS — C539 Malignant neoplasm of cervix uteri, unspecified: Secondary | ICD-10-CM

## 2012-01-21 MED ORDER — ONDANSETRON 8 MG PO TBDP: 8.0000 mg | ORAL_TABLET | Freq: Three times a day (TID) | ORAL | Status: DC | PRN

## 2012-01-26 ENCOUNTER — Telehealth: Payer: Self-pay

## 2012-01-26 ENCOUNTER — Telehealth: Payer: Self-pay | Admitting: Oncology

## 2012-01-26 NOTE — Telephone Encounter (Signed)
called pt with her chemo appts     anne

## 2012-01-26 NOTE — Telephone Encounter (Signed)
Ms. [name] continues with a small amount of clear drainage from the right groin wound.  Afebrile.  Is it ok to get her treatment on Tues. 02-01-12 with would still open? Ms. [name] is concerned about her new Pac site.  It is not red.  She can feel the tube and site is sore.  Told her that this is to be expected with PAC.  It will take a month for the pocket of tissue to heal completely. It will become less sore every few days. The PAC site is assessed every time tit is accessed.  If site gets red, swollen , or have purlent drainage she needs to call the office.  Ms. [name] verbalized understanding.

## 2012-01-26 NOTE — Telephone Encounter (Signed)
Told [name] that Dr. Darrold Span expects that it will be fine to treat her on 02-01-12.  It is still several days away and the site may even be better by then.  The treatment nurse can look at the site on 02-01-12 if needed and discuss with MD, PA., or Telford Nab, if available.  [name] verbalized understanding.

## 2012-02-01 ENCOUNTER — Ambulatory Visit (HOSPITAL_BASED_OUTPATIENT_CLINIC_OR_DEPARTMENT_OTHER): Payer: 59

## 2012-02-01 ENCOUNTER — Other Ambulatory Visit (HOSPITAL_BASED_OUTPATIENT_CLINIC_OR_DEPARTMENT_OTHER): Payer: 59 | Admitting: Lab

## 2012-02-01 VITALS — BP 110/63 | HR 87 | Temp 98.2°F | Resp 20

## 2012-02-01 DIAGNOSIS — Z5111 Encounter for antineoplastic chemotherapy: Secondary | ICD-10-CM

## 2012-02-01 DIAGNOSIS — C787 Secondary malignant neoplasm of liver and intrahepatic bile duct: Secondary | ICD-10-CM

## 2012-02-01 DIAGNOSIS — C539 Malignant neoplasm of cervix uteri, unspecified: Secondary | ICD-10-CM

## 2012-02-01 LAB — CBC WITH DIFFERENTIAL/PLATELET
BASO%: 0.7 % (ref 0.0–2.0)
EOS%: 0.4 % (ref 0.0–7.0)
HCT: 34.8 % (ref 34.8–46.6)
LYMPH%: 23.2 % (ref 14.0–49.7)
MCH: 29.1 pg (ref 25.1–34.0)
MCHC: 33.6 g/dL (ref 31.5–36.0)
MONO%: 1.4 % (ref 0.0–14.0)
NEUT%: 74.3 % (ref 38.4–76.8)
Platelets: 242 10*3/uL (ref 145–400)

## 2012-02-01 MED ORDER — SODIUM CHLORIDE 0.9 % IV SOLN
231.4000 mg | Freq: Once | INTRAVENOUS | Status: AC
  Administered 2012-02-01: 230 mg via INTRAVENOUS
  Filled 2012-02-01: qty 23

## 2012-02-01 MED ORDER — PACLITAXEL CHEMO INJECTION 300 MG/50ML
80.0000 mg/m2 | Freq: Once | INTRAVENOUS | Status: AC
  Administered 2012-02-01: 156 mg via INTRAVENOUS
  Filled 2012-02-01: qty 26

## 2012-02-01 MED ORDER — HEPARIN SOD (PORK) LOCK FLUSH 100 UNIT/ML IV SOLN
500.0000 [IU] | Freq: Once | INTRAVENOUS | Status: AC | PRN
  Administered 2012-02-01: 500 [IU]
  Filled 2012-02-01: qty 5

## 2012-02-01 MED ORDER — FAMOTIDINE IN NACL 20-0.9 MG/50ML-% IV SOLN
20.0000 mg | Freq: Once | INTRAVENOUS | Status: AC
  Administered 2012-02-01: 20 mg via INTRAVENOUS

## 2012-02-01 MED ORDER — DIPHENHYDRAMINE HCL 50 MG/ML IJ SOLN
50.0000 mg | Freq: Once | INTRAMUSCULAR | Status: AC
  Administered 2012-02-01: 50 mg via INTRAVENOUS

## 2012-02-01 MED ORDER — SODIUM CHLORIDE 0.9 % IV SOLN
Freq: Once | INTRAVENOUS | Status: AC
  Administered 2012-02-01: 09:00:00 via INTRAVENOUS

## 2012-02-01 MED ORDER — DEXAMETHASONE SODIUM PHOSPHATE 4 MG/ML IJ SOLN
20.0000 mg | Freq: Once | INTRAMUSCULAR | Status: AC
  Administered 2012-02-01: 20 mg via INTRAVENOUS

## 2012-02-01 MED ORDER — ONDANSETRON 16 MG/50ML IVPB (CHCC)
16.0000 mg | Freq: Once | INTRAVENOUS | Status: AC
  Administered 2012-02-01: 16 mg via INTRAVENOUS

## 2012-02-01 MED ORDER — SODIUM CHLORIDE 0.9 % IV SOLN
150.0000 mg | Freq: Once | INTRAVENOUS | Status: AC
  Administered 2012-02-01: 150 mg via INTRAVENOUS
  Filled 2012-02-01: qty 5

## 2012-02-01 MED ORDER — SODIUM CHLORIDE 0.9 % IJ SOLN
10.0000 mL | INTRAMUSCULAR | Status: DC | PRN
  Administered 2012-02-01: 10 mL
  Filled 2012-02-01: qty 10

## 2012-02-01 NOTE — Patient Instructions (Addendum)
[facility] Discharge Instructions for Patients Receiving Chemotherapy  Today you received the following chemotherapy agents Taxol/ Carboplatin  To help prevent nausea and vomiting after your treatment, we encourage you to take your nausea medication Zofran Begin taking it at  and take it as often as prescribed for the next 36 hours.   If you develop nausea and vomiting that is not controlled by your nausea medication, call the clinic. If it is after clinic hours your family physician or the after hours number for the clinic or go to the Emergency Department.   BELOW ARE SYMPTOMS THAT SHOULD BE REPORTED IMMEDIATELY:  *FEVER GREATER THAN 100.5 F  *CHILLS WITH OR WITHOUT FEVER  NAUSEA AND VOMITING THAT IS NOT CONTROLLED WITH YOUR NAUSEA MEDICATION  *UNUSUAL SHORTNESS OF BREATH  *UNUSUAL BRUISING OR BLEEDING  TENDERNESS IN MOUTH AND THROAT WITH OR WITHOUT PRESENCE OF ULCERS  *URINARY PROBLEMS  *BOWEL PROBLEMS  UNUSUAL RASH Items with * indicate a potential emergency and should be followed up as soon as possible.  One of the nurses will contact you 24 hours after your treatment. Please let the nurse know about any problems that you may have experienced. Feel free to call the clinic you have any questions or concerns. The clinic phone number is [phone].   I have been informed and understand all the instructions given to me. I know to contact the clinic, my physician, or go to the Emergency Department if any problems should occur. I do not have any questions at this time, but understand that I may call the clinic during office hours   should I have any questions or need assistance in obtaining follow up care.    __________________________________________  _____________  __________ Signature of Patient or Authorized Representative            Date                   Time    __________________________________________ Nurse's Signature  Paclitaxel  injection What is this medicine? PACLITAXEL (PAK li TAX el) is a chemotherapy drug. It targets fast dividing cells, like cancer cells, and causes these cells to die. This medicine is used to treat ovarian cancer, breast cancer, and other cancers. This medicine may be used for other purposes; ask your health care provider or pharmacist if you have questions. What should I tell my health care provider before I take this medicine? They need to know if you have any of these conditions: -blood disorders -irregular heartbeat -infection (especially a virus infection such as chickenpox, cold sores, or herpes) -liver disease -previous or ongoing radiation therapy -an unusual or allergic reaction to paclitaxel, alcohol, polyoxyethylated castor oil, other chemotherapy agents, other medicines, foods, dyes, or preservatives -pregnant or trying to get pregnant -breast-feeding How should I use this medicine? This drug is given as an infusion into a vein. It is administered in a hospital or clinic by a specially trained health care professional. Talk to your pediatrician regarding the use of this medicine in children. Special care may be needed. Overdosage: If you think you have taken too much of this medicine contact a poison control center or emergency room at once. NOTE: This medicine is only for you. Do not share this medicine with others. What if I miss a dose? It is important not to miss your dose. Call your doctor or health care professional if you are unable to keep an appointment. What may interact with this medicine? Do  not take this medicine with any of the following medications: -disulfiram -metronidazole This medicine may also interact with the following medications: -cyclosporine -dexamethasone -diazepam -ketoconazole -medicines to increase blood counts like filgrastim, pegfilgrastim, sargramostim -other chemotherapy drugs like cisplatin, doxorubicin, epirubicin, etoposide, teniposide,  vincristine -quinidine -testosterone -vaccines -verapamil Talk to your doctor or health care professional before taking any of these medicines: -acetaminophen -aspirin -ibuprofen -ketoprofen -naproxen This list may not describe all possible interactions. Give your health care provider a list of all the medicines, herbs, non-prescription drugs, or dietary supplements you use. Also tell them if you smoke, drink alcohol, or use illegal drugs. Some items may interact with your medicine. What should I watch for while using this medicine? Your condition will be monitored carefully while you are receiving this medicine. You will need important blood work done while you are taking this medicine. This drug may make you feel generally unwell. This is not uncommon, as chemotherapy can affect healthy cells as well as cancer cells. Report any side effects. Continue your course of treatment even though you feel ill unless your doctor tells you to stop. In some cases, you may be given additional medicines to help with side effects. Follow all directions for their use. Call your doctor or health care professional for advice if you get a fever, chills or sore throat, or other symptoms of a cold or flu. Do not treat yourself. This drug decreases your body's ability to fight infections. Try to avoid being around people who are sick. This medicine may increase your risk to bruise or bleed. Call your doctor or health care professional if you notice any unusual bleeding. Be careful brushing and flossing your teeth or using a toothpick because you may get an infection or bleed more easily. If you have any dental work done, tell your dentist you are receiving this medicine. Avoid taking products that contain aspirin, acetaminophen, ibuprofen, naproxen, or ketoprofen unless instructed by your doctor. These medicines may hide a fever. Do not become pregnant while taking this medicine. Women should inform their doctor if  they wish to become pregnant or think they might be pregnant. There is a potential for serious side effects to an unborn child. Talk to your health care professional or pharmacist for more information. Do not breast-feed an infant while taking this medicine. Men are advised not to father a child while receiving this medicine. What side effects may I notice from receiving this medicine? Side effects that you should report to your doctor or health care professional as soon as possible: -allergic reactions like skin rash, itching or hives, swelling of the face, lips, or tongue -low blood counts - This drug may decrease the number of white blood cells, red blood cells and platelets. You may be at increased risk for infections and bleeding. -signs of infection - fever or chills, cough, sore throat, pain or difficulty passing urine -signs of decreased platelets or bleeding - bruising, pinpoint red spots on the skin, black, tarry stools, nosebleeds -signs of decreased red blood cells - unusually weak or tired, fainting spells, lightheadedness -breathing problems -chest pain -high or low blood pressure -mouth sores -nausea and vomiting -pain, swelling, redness or irritation at the injection site -pain, tingling, numbness in the hands or feet -slow or irregular heartbeat -swelling of the ankle, feet, hands Side effects that usually do not require medical attention (report to your doctor or health care professional if they continue or are bothersome): -bone pain -complete hair loss including hair  on your head, underarms, pubic hair, eyebrows, and eyelashes -changes in the color of fingernails -diarrhea -loosening of the fingernails -loss of appetite -muscle or joint pain -red flush to skin -sweating This list may not describe all possible side effects. Call your doctor for medical advice about side effects. You may report side effects to FDA at 1-800-FDA-1088. Where should I keep my  medicine? This drug is given in a hospital or clinic and will not be stored at home. NOTE: This sheet is a summary. It may not cover all possible information. If you have questions about this medicine, talk to your doctor, pharmacist, or health care provider.  2012, [employer]. (01/29/2008 11:54:26 AM)   Carboplatin injection What is this medicine? CARBOPLATIN (KAR boe pla tin) is a chemotherapy drug. It targets fast dividing cells, like cancer cells, and causes these cells to die. This medicine is used to treat ovarian cancer and many other cancers. This medicine may be used for other purposes; ask your health care provider or pharmacist if you have questions. What should I tell my health care provider before I take this medicine? They need to know if you have any of these conditions: -blood disorders -hearing problems -kidney disease -recent or ongoing radiation therapy -an unusual or allergic reaction to carboplatin, cisplatin, other chemotherapy, other medicines, foods, dyes, or preservatives -pregnant or trying to get pregnant -breast-feeding How should I use this medicine? This drug is usually given as an infusion into a vein. It is administered in a hospital or clinic by a specially trained health care professional. Talk to your pediatrician regarding the use of this medicine in children. Special care may be needed. Overdosage: If you think you have taken too much of this medicine contact a poison control center or emergency room at once. NOTE: This medicine is only for you. Do not share this medicine with others. What if I miss a dose? It is important not to miss a dose. Call your doctor or health care professional if you are unable to keep an appointment. What may interact with this medicine? -medicines for seizures -medicines to increase blood counts like filgrastim, pegfilgrastim, sargramostim -some antibiotics like amikacin, gentamicin, neomycin, streptomycin,  tobramycin -vaccines Talk to your doctor or health care professional before taking any of these medicines: -acetaminophen -aspirin -ibuprofen -ketoprofen -naproxen This list may not describe all possible interactions. Give your health care provider a list of all the medicines, herbs, non-prescription drugs, or dietary supplements you use. Also tell them if you smoke, drink alcohol, or use illegal drugs. Some items may interact with your medicine. What should I watch for while using this medicine? Your condition will be monitored carefully while you are receiving this medicine. You will need important blood work done while you are taking this medicine. This drug may make you feel generally unwell. This is not uncommon, as chemotherapy can affect healthy cells as well as cancer cells. Report any side effects. Continue your course of treatment even though you feel ill unless your doctor tells you to stop. In some cases, you may be given additional medicines to help with side effects. Follow all directions for their use. Call your doctor or health care professional for advice if you get a fever, chills or sore throat, or other symptoms of a cold or flu. Do not treat yourself. This drug decreases your body's ability to fight infections. Try to avoid being around people who are sick. This medicine may increase your risk to bruise or  bleed. Call your doctor or health care professional if you notice any unusual bleeding. Be careful brushing and flossing your teeth or using a toothpick because you may get an infection or bleed more easily. If you have any dental work done, tell your dentist you are receiving this medicine. Avoid taking products that contain aspirin, acetaminophen, ibuprofen, naproxen, or ketoprofen unless instructed by your doctor. These medicines may hide a fever. Do not become pregnant while taking this medicine. Women should inform their doctor if they wish to become pregnant or think  they might be pregnant. There is a potential for serious side effects to an unborn child. Talk to your health care professional or pharmacist for more information. Do not breast-feed an infant while taking this medicine. What side effects may I notice from receiving this medicine? Side effects that you should report to your doctor or health care professional as soon as possible: -allergic reactions like skin rash, itching or hives, swelling of the face, lips, or tongue -signs of infection - fever or chills, cough, sore throat, pain or difficulty passing urine -signs of decreased platelets or bleeding - bruising, pinpoint red spots on the skin, black, tarry stools, nosebleeds -signs of decreased red blood cells - unusually weak or tired, fainting spells, lightheadedness -breathing problems -changes in hearing -changes in vision -chest pain -high blood pressure -low blood counts - This drug may decrease the number of white blood cells, red blood cells and platelets. You may be at increased risk for infections and bleeding. -nausea and vomiting -pain, swelling, redness or irritation at the injection site -pain, tingling, numbness in the hands or feet -problems with balance, talking, walking -trouble passing urine or change in the amount of urine Side effects that usually do not require medical attention (report to your doctor or health care professional if they continue or are bothersome): -hair loss -loss of appetite -metallic taste in the mouth or changes in taste This list may not describe all possible side effects. Call your doctor for medical advice about side effects. You may report side effects to FDA at 1-800-FDA-1088. Where should I keep my medicine? This drug is given in a hospital or clinic and will not be stored at home. NOTE: This sheet is a summary. It may not cover all possible information. If you have questions about this medicine, talk to your doctor, pharmacist, or health care  provider.  2012, [employer]. (05/23/2007 2:38:05 PM)  Ondansetron injection What is this medicine? ONDANSETRON (on DAN se tron) is used to treat nausea and vomiting caused by chemotherapy. It is also used to prevent or treat nausea and vomiting after surgery. This medicine may be used for other purposes; ask your health care provider or pharmacist if you have questions. What should I tell my health care provider before I take this medicine? They need to know if you have any of these conditions: -heart disease -history of irregular heartbeat -liver disease -low levels of magnesium or potassium in the blood -an unusual or allergic reaction to ondansetron, granisetron, other medicines, foods, dyes, or preservatives -pregnant or trying to get pregnant -breast-feeding How should I use this medicine? This medicine is for infusion into a vein. It is given by a health care professional in a hospital or clinic setting. Talk to your pediatrician regarding the use of this medicine in children. Special care may be needed. Overdosage: If you think you have taken too much of this medicine contact a poison control center or emergency room  at once. NOTE: This medicine is only for you. Do not share this medicine with others. What if I miss a dose? This does not apply. What may interact with this medicine? Do not take this medicine with any of the following medications: -apomorphine  This medicine may also interact with the following medications: -carbamazepine -phenytoin -rifampicin -tramadol This list may not describe all possible interactions. Give your health care provider a list of all the medicines, herbs, non-prescription drugs, or dietary supplements you use. Also tell them if you smoke, drink alcohol, or use illegal drugs. Some items may interact with your medicine. What should I watch for while using this medicine? Your condition will be monitored carefully while you are  receiving this medicine. What side effects may I notice from receiving this medicine? Side effects that you should report to your doctor or health care professional as soon as possible: -allergic reactions like skin rash, itching or hives, swelling of the face, lips, or tongue -breathing problems -dizziness -fast or irregular heartbeat -feeling faint or lightheaded, falls -fever and chills -swelling of the hands and feet -tightness in the chest Side effects that usually do not require medical attention (report to your doctor or health care professional if they continue or are bothersome): -constipation or diarrhea -headache This list may not describe all possible side effects. Call your doctor for medical advice about side effects. You may report side effects to FDA at 1-800-FDA-1088. Where should I keep my medicine? This drug is given in a hospital or clinic and will not be stored at home. NOTE: This sheet is a summary. It may not cover all possible information. If you have questions about this medicine, talk to your doctor, pharmacist, or health care provider.  2012, [employer]. (11/19/2009 11:37:58 AM)

## 2012-02-02 ENCOUNTER — Telehealth: Payer: Self-pay | Admitting: *Deleted

## 2012-02-02 NOTE — Telephone Encounter (Signed)
Message copied by Augusto Garbe on Wed Feb 02, 2012 12:18 PM ------      Message from: Kallie Locks      Created: Tue Feb 01, 2012  2:05 PM      Regarding: "1st time Chemotherapy"      Contact: [phone]       Patient received Taxol/Carbo today, per Dr. Lindley Magnus (previously received over a year ago); tolerated well with no complaints.

## 2012-02-02 NOTE — Telephone Encounter (Signed)
Ms. [name] is doing well.  Not aware of any side effects or symptoms.  Has patient education handouts on hand to use as a reference.  Denies n/v, urinary or bowel problems.  Reports headache and sinus drainage.  Denies fever.  Encouraged to drink more than the 64 oz water recommendation and call if temperature >10.5 develops.  No questions.

## 2012-02-07 ENCOUNTER — Other Ambulatory Visit: Payer: Self-pay | Admitting: Oncology

## 2012-02-08 ENCOUNTER — Other Ambulatory Visit: Payer: Self-pay

## 2012-02-08 ENCOUNTER — Other Ambulatory Visit (HOSPITAL_BASED_OUTPATIENT_CLINIC_OR_DEPARTMENT_OTHER): Payer: 59 | Admitting: Lab

## 2012-02-08 ENCOUNTER — Encounter: Payer: Self-pay | Admitting: Oncology

## 2012-02-08 ENCOUNTER — Other Ambulatory Visit: Payer: 59 | Admitting: Lab

## 2012-02-08 ENCOUNTER — Telehealth: Payer: Self-pay | Admitting: Oncology

## 2012-02-08 ENCOUNTER — Ambulatory Visit (HOSPITAL_BASED_OUTPATIENT_CLINIC_OR_DEPARTMENT_OTHER): Payer: 59 | Admitting: Oncology

## 2012-02-08 ENCOUNTER — Ambulatory Visit (HOSPITAL_BASED_OUTPATIENT_CLINIC_OR_DEPARTMENT_OTHER): Payer: 59

## 2012-02-08 VITALS — BP 123/81 | HR 111 | Temp 97.1°F | Resp 18 | Ht 61.5 in | Wt 189.2 lb

## 2012-02-08 DIAGNOSIS — Z5111 Encounter for antineoplastic chemotherapy: Secondary | ICD-10-CM

## 2012-02-08 DIAGNOSIS — C539 Malignant neoplasm of cervix uteri, unspecified: Secondary | ICD-10-CM

## 2012-02-08 DIAGNOSIS — I4891 Unspecified atrial fibrillation: Secondary | ICD-10-CM

## 2012-02-08 DIAGNOSIS — I82409 Acute embolism and thrombosis of unspecified deep veins of unspecified lower extremity: Secondary | ICD-10-CM

## 2012-02-08 DIAGNOSIS — C787 Secondary malignant neoplasm of liver and intrahepatic bile duct: Secondary | ICD-10-CM

## 2012-02-08 LAB — BASIC METABOLIC PANEL (CC13)
CO2: 20 mEq/L — ABNORMAL LOW (ref 22–29)
Calcium: 10 mg/dL (ref 8.4–10.4)
Creatinine: 1.1 mg/dL (ref 0.6–1.1)

## 2012-02-08 LAB — CBC WITH DIFFERENTIAL/PLATELET
BASO%: 0.1 % (ref 0.0–2.0)
EOS%: 0 % (ref 0.0–7.0)
HCT: 35.7 % (ref 34.8–46.6)
LYMPH%: 12.5 % — ABNORMAL LOW (ref 14.0–49.7)
MCH: 29.3 pg (ref 25.1–34.0)
MCHC: 33.5 g/dL (ref 31.5–36.0)
MCV: 87.4 fL (ref 79.5–101.0)
MONO#: 0 10*3/uL — ABNORMAL LOW (ref 0.1–0.9)
MONO%: 0.7 % (ref 0.0–14.0)
NEUT%: 86.7 % — ABNORMAL HIGH (ref 38.4–76.8)
Platelets: 271 10*3/uL (ref 145–400)

## 2012-02-08 MED ORDER — HEPARIN SOD (PORK) LOCK FLUSH 100 UNIT/ML IV SOLN
500.0000 [IU] | Freq: Once | INTRAVENOUS | Status: DC | PRN
  Filled 2012-02-08: qty 5

## 2012-02-08 MED ORDER — DEXAMETHASONE SODIUM PHOSPHATE 4 MG/ML IJ SOLN
20.0000 mg | Freq: Once | INTRAMUSCULAR | Status: AC
  Administered 2012-02-08: 20 mg via INTRAVENOUS

## 2012-02-08 MED ORDER — DIPHENHYDRAMINE HCL 50 MG/ML IJ SOLN
50.0000 mg | Freq: Once | INTRAMUSCULAR | Status: AC
  Administered 2012-02-08: 50 mg via INTRAVENOUS

## 2012-02-08 MED ORDER — PACLITAXEL CHEMO INJECTION 300 MG/50ML
80.0000 mg/m2 | Freq: Once | INTRAVENOUS | Status: AC
  Administered 2012-02-08: 156 mg via INTRAVENOUS
  Filled 2012-02-08: qty 26

## 2012-02-08 MED ORDER — ONDANSETRON 8 MG/50ML IVPB (CHCC)
8.0000 mg | Freq: Once | INTRAVENOUS | Status: AC
  Administered 2012-02-08: 8 mg via INTRAVENOUS

## 2012-02-08 MED ORDER — SODIUM CHLORIDE 0.9 % IJ SOLN
10.0000 mL | INTRAMUSCULAR | Status: DC | PRN
  Filled 2012-02-08: qty 10

## 2012-02-08 MED ORDER — FAMOTIDINE IN NACL 20-0.9 MG/50ML-% IV SOLN
20.0000 mg | Freq: Once | INTRAVENOUS | Status: AC
Start: 2012-02-08 — End: 2012-02-08
  Administered 2012-02-08: 20 mg via INTRAVENOUS

## 2012-02-08 MED ORDER — SODIUM CHLORIDE 0.9 % IV SOLN
Freq: Once | INTRAVENOUS | Status: DC
  Administered 2012-02-08: 15:00:00 via INTRAVENOUS

## 2012-02-08 NOTE — Patient Instructions (Addendum)
[facility] Discharge Instructions for Patients Receiving Chemotherapy  Today you received the following chemotherapy agents Taxol.  To help prevent nausea and vomiting after your treatment, we encourage you to take your nausea medication as prescribed.   If you develop nausea and vomiting that is not controlled by your nausea medication, call the clinic. If it is after clinic hours your family physician or the after hours number for the clinic or go to the Emergency Department.   BELOW ARE SYMPTOMS THAT SHOULD BE REPORTED IMMEDIATELY:  *FEVER GREATER THAN 100.5 F  *CHILLS WITH OR WITHOUT FEVER  NAUSEA AND VOMITING THAT IS NOT CONTROLLED WITH YOUR NAUSEA MEDICATION  *UNUSUAL SHORTNESS OF BREATH  *UNUSUAL BRUISING OR BLEEDING  TENDERNESS IN MOUTH AND THROAT WITH OR WITHOUT PRESENCE OF ULCERS  *URINARY PROBLEMS  *BOWEL PROBLEMS  UNUSUAL RASH Items with * indicate a potential emergency and should be followed up as soon as possible.  Feel free to call the clinic you have any questions or concerns. The clinic phone number is [phone].   I have been informed and understand all the instructions given to me. I know to contact the clinic, my physician, or go to the Emergency Department if any problems should occur. I do not have any questions at this time, but understand that I may call the clinic during office hours   should I have any questions or need assistance in obtaining follow up care.    __________________________________________  _____________  __________ Signature of Patient or Authorized Representative            Date                   Time    __________________________________________ Nurse's Signature    

## 2012-02-08 NOTE — Patient Instructions (Signed)
Wound care as discussed

## 2012-02-08 NOTE — Telephone Encounter (Signed)
appts made and printed for pt spouse aware that chemo will be added and that i will call,email to mw

## 2012-02-08 NOTE — Progress Notes (Signed)
OFFICE PROGRESS NOTE   02/08/2012  Physicians:J.Kinard, V.Leschber, D.ClarkePearson, J.Allred, D.Margarita Grizzle, H.Derrell Lolling.  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 The right inguinal fluid collection was a sterile lymphocele, which has progressively improved since I & D by Dr Derrell Lolling on 01-11-12. She had day 1 cycle 1 carboplatin (AUC 2) and taxol at 80 mg/m2 on 02-01-12 and is for day 8 taxol today. She had PAC by IR prior to resuming chemotherapy.  Oncologic history is of radical hysterectomy, bilateral salpingo-oophorectomy and pelvic lymphadenectomy in March of 2005 by Dr Ronita Hipps for a stage IB cervical cancer. Final pathology showed no residual disease and all lymph nodes were negative. The patient was followed for 5 years with no evidence of recurrent disease until she developed a recurrence of the right pelvic sidewall in February of 2010. This caused obstruction of the right ureter. She was treated with radiation therapy and concurrent cisplatin chemotherapy. In late 2011 she had recurrence in right inguinal lymph node and left pelvic nodes and received additional radiation therapy to those areas, completed in January 2012. In August 2012 the patient was found to have a 2.8 cm liver lesion and received 6 cycles of Taxol + one cycle of carboplatin. She had a partial response to chemo, then increase in the solitary liver lesion and RFA was performed on 10/07/2011. 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 After ~ 3 months she was changed to xarelto by Dr Felicity Coyer because of local bruising and discomfort from the lovenox injections; she continues xarelto now. She had progression in left periaortic nodes which was symptomatic with pain  and treated by Dr Roselind Messier with 5040 cGY in 28 fractions from 10-11-11 thru 11-19-11. Pain has improved since radiation and she has been able to decrease pain medication to present oycontin 20 mg q 12 hrs with prn percocet. CT CAP repeated 12-31-11 found multiple new liver lesions, small left inguinal nodes and a 5 cm fluid collection right groin. Chemotherapy was resumed with the low dose carboplatin and taxol as above on 02-01-12. Other significa nt comorbidities include atrophic left kidney (previously stented without improvement), atrial fibrillation and HTN. She continues xarelto.  Patient did well day of and day after chemo, then was extremely fatigued on days 3 and 4, and has had occasional nausea and one episode of vomiting since then. She has no peripheral neuropathy symptoms and has not had significant taxol aches. The right groin wound continues to drain small amounts of clear fluid, occasionally slightly yellowish; she needs to change gause dressing ~ 2x daily. She has had no fever and the area is not painful. She has had no bleeding, has been able to eat, bowels ok, no increased shortness of breath, no increased LE swelling. Remainder of 10 point Review of Systems negative.  Objective:  Vital signs in last 24 hours:  BP 123/81  Pulse 111  Temp 97.1 F (36.2 C) (Oral)  Resp 18  Ht 5' 1.5" (1.562 m)  Wt 189 lb 3.2 oz (85.821 kg)  BMI 35.17 kg/m2 Weight is down 1 lb.Easily mobile, looks more pale despite Hgb as below, not icteric. Alert, NAD, more talkative.   HEENT:PERRLA, sclera clear, anicteric and oropharynx clear, no lesions No alopecia LymphaticsCervical, supraclavicular, and axillary nodes normal. No  inguinal adenopathy Resp: clear to auscultation bilaterally and normal percussion bilaterally Cardio: irregularly irregular rhythm and no S3 or S4 GI: full RUQ, soft otherwise, bowel sounds normal, not tender, no rub. Right inguinal area has shallow open wound 1 cm diameter, clean  base, no obvious drainage, no surrounding erythema. Extremities: 1+ swelling LLE no tenderness Neuro:no sensory deficits noted Portacath-without erythema, resolving ecchymoses from recent placement Skin also with discoloration bilateral abdomen from previous lovenox injections.  Lab Results: CBC today has WBC 3.3, ANC 2.9, Hgb 12 and plt 271 BMET available after visit glucose 236, creat 1.1, electrolytes ok  Studies/Results:  No results found.  Medications: I have reviewed the patient's current medications. She is premedicating taxol with decadron 20 mg 12 hrs prior.  We have discussed washing the right groin area in shower daily now.  Patient is in agreement with proceding with taxol today. With doses given, she may not need gCSF, and prefers that we follow counts instead of empirically using this now.  Assessment/Plan: 1.Metastatic squamous cell carcinoma of cervix Day 8 cycle 1 taxol today. I will see her back with CBC/CMET on 02-18-12 or sooner if needed. May need to add gCSF depending on counts. 2.PAC in 3.sterile lymphocele right groin: continues to improve since drainage, likely a little slower to resolve due to interim chemotherapy. 4.atrial fibrillation on xarelto 5. Recurrent LE DVTs, IVC filter in 6.hyperglycemia today related to premed decadron 7. Atrophic left kidney. Renal function stable, following closely with carboplatin  Patient and friend followed conversation well and were in agreement with plan  Reece Packer, MD   02/08/2012, 2:30 PM

## 2012-02-09 ENCOUNTER — Telehealth: Payer: Self-pay | Admitting: *Deleted

## 2012-02-09 ENCOUNTER — Telehealth: Payer: Self-pay | Admitting: Oncology

## 2012-02-09 NOTE — Telephone Encounter (Signed)
s/w pt and she is aware of her 12/24 and 12/31 appts     anne

## 2012-02-09 NOTE — Telephone Encounter (Signed)
Per staff message and POF I have scheduled appts. JWM  

## 2012-02-18 ENCOUNTER — Other Ambulatory Visit (HOSPITAL_BASED_OUTPATIENT_CLINIC_OR_DEPARTMENT_OTHER): Payer: 59 | Admitting: Lab

## 2012-02-18 ENCOUNTER — Ambulatory Visit (HOSPITAL_BASED_OUTPATIENT_CLINIC_OR_DEPARTMENT_OTHER): Payer: 59 | Admitting: Oncology

## 2012-02-18 ENCOUNTER — Other Ambulatory Visit: Payer: Self-pay

## 2012-02-18 ENCOUNTER — Telehealth (INDEPENDENT_AMBULATORY_CARE_PROVIDER_SITE_OTHER): Payer: Self-pay | Admitting: General Surgery

## 2012-02-18 ENCOUNTER — Encounter: Payer: Self-pay | Admitting: Oncology

## 2012-02-18 ENCOUNTER — Telehealth: Payer: Self-pay | Admitting: Oncology

## 2012-02-18 VITALS — BP 126/93 | HR 127 | Temp 98.3°F | Resp 20 | Ht 61.5 in | Wt 193.4 lb

## 2012-02-18 DIAGNOSIS — I4891 Unspecified atrial fibrillation: Secondary | ICD-10-CM

## 2012-02-18 DIAGNOSIS — C539 Malignant neoplasm of cervix uteri, unspecified: Secondary | ICD-10-CM

## 2012-02-18 DIAGNOSIS — C787 Secondary malignant neoplasm of liver and intrahepatic bile duct: Secondary | ICD-10-CM

## 2012-02-18 DIAGNOSIS — I82409 Acute embolism and thrombosis of unspecified deep veins of unspecified lower extremity: Secondary | ICD-10-CM

## 2012-02-18 LAB — COMPREHENSIVE METABOLIC PANEL (CC13)
AST: 47 U/L — ABNORMAL HIGH (ref 5–34)
Alkaline Phosphatase: 224 U/L — ABNORMAL HIGH (ref 40–150)
BUN: 9 mg/dL (ref 7.0–26.0)
Glucose: 108 mg/dl — ABNORMAL HIGH (ref 70–99)
Potassium: 4.2 mEq/L (ref 3.5–5.1)
Total Bilirubin: 0.31 mg/dL (ref 0.20–1.20)

## 2012-02-18 LAB — CBC WITH DIFFERENTIAL/PLATELET
Basophils Absolute: 0 10*3/uL (ref 0.0–0.1)
EOS%: 1.3 % (ref 0.0–7.0)
Eosinophils Absolute: 0 10*3/uL (ref 0.0–0.5)
LYMPH%: 21.9 % (ref 14.0–49.7)
MCH: 29.8 pg (ref 25.1–34.0)
MCV: 87.6 fL (ref 79.5–101.0)
MONO%: 15.5 % — ABNORMAL HIGH (ref 0.0–14.0)
NEUT#: 1.5 10*3/uL (ref 1.5–6.5)
Platelets: 228 10*3/uL (ref 145–400)
RBC: 3.65 10*6/uL — ABNORMAL LOW (ref 3.70–5.45)
RDW: 15 % — ABNORMAL HIGH (ref 11.2–14.5)

## 2012-02-18 MED ORDER — FILGRASTIM 300 MCG/0.5ML IJ SOLN
300.0000 ug | Freq: Once | INTRAMUSCULAR | Status: AC
  Administered 2012-02-18: 300 ug via SUBCUTANEOUS
  Filled 2012-02-18: qty 0.5

## 2012-02-18 MED ORDER — ZOLPIDEM TARTRATE 5 MG PO TABS: 5.0000 mg | ORAL_TABLET | Freq: Every evening | ORAL | Status: DC | PRN

## 2012-02-18 MED ORDER — NYSTATIN 100000 UNIT/GM EX OINT: TOPICAL_OINTMENT | CUTANEOUS | Status: DC

## 2012-02-18 NOTE — Patient Instructions (Signed)
Dry area around wound then put mycostatin ointment ~ 3x daily

## 2012-02-18 NOTE — Progress Notes (Signed)
OFFICE PROGRESS NOTE   02/18/2012   Physicians:J.Kinard, V.Leschber, D.ClarkePearson, J.Allred, D.Margarita Grizzle, H.Derrell Lolling.   INTERVAL HISTORY:  Patient is seen, alone for visit, in continuing attention to her metastatic cervical carcinoma, recently progressive in liver, now being treated with dose reduced carboplatin and weekly taxol, cycle 1 given 12-3 and 02-08-12. She tolerated this first cycle generally well. Primary concern today is increased drainage from the lymphocele in right inguinal region, post I & D by Dr Derrell Lolling 01-11-12.  History is of radical hysterectomy, bilateral salpingo-oophorectomy and pelvic lymphadenectomy in March of 2005 by Dr Ronita Hipps for a stage IB cervical cancer. Final pathology showed no residual disease and all lymph nodes were negative. The patient was followed for 5 years with no evidence of recurrent disease until she developed a recurrence of the right pelvic sidewall in February of 2010. This caused obstruction of the right ureter. She was treated with radiation therapy and concurrent cisplatin chemotherapy. In late 2011 she had recurrence in right inguinal lymph node and left pelvic nodes and received additional radiation therapy to those areas, completed in January 2012. In August 2012 the patient was found to have a 2.8 cm liver lesion and received 6 cycles of Taxol + one cycle of carboplatin. She had a partial response to chemo, then increase in the solitary liver lesion and RFA was performed on 10/07/2011. 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 After ~ 3 months she was changed to xarelto by Dr Felicity Coyer because of local bruising and discomfort from the lovenox injections; she continues xarelto now. She had progression in left periaortic nodes which was symptomatic with pain and treated by Dr Roselind Messier with 5040 cGY in 28  fractions from 10-11-11 thru 11-19-11. Pain has improved since radiation and she has been able to decrease pain medication to present oycontin 20 mg q 12 hrs with prn percocet. CT CAP repeated 12-31-11 found multiple new liver lesions, small left inguinal nodes and a 5 cm fluid collection right groin. Chemotherapy was resumed with the low dose carboplatin and taxol as above on 02-01-12.  Other significant comorbidities include atrophic left kidney (previously stented without improvement), atrial fibrillation and HTN. She continues xarelto.  Patient's appetite is much better in past 2 weeks, with no significant nausea. She is very energetic for 24 hours after steroids with chemo, then fatigued for ~ 2 days. Neurontin did not help her sleep, still staying awake for hours at night. She would like to try low dose ambien, which hopefully will not be too sedating. She has continuous drainage of clear fluid from the right groin area, saturating pads and difficult to manage. The area has been a little tender and skin is irritated. She has had no fever and no abdominal pain.She has no increased pain, using oxycontin 20 mg bid and prn percocet. Bowels are moving. She denies shortness of breath or cough. She has had no increased swelling in LE. She has had no bleeding. Remainder of 10 point Review of Systems negative.  Objective:  Vital signs in last 24 hours:  BP 126/93  Pulse 127  Temp 98.3 F (36.8 C) (Oral)  Resp 20  Ht 5' 1.5" (1.562 m)  Wt 193 lb 6.4 oz (87.726 kg)  BMI 35.95 kg/m2 Weight is up 4 lbs. Easily ambulatory, NAD, looks tired but very pleasant as always.  HEENT:PERRLA, sclera  clear, anicteric and oropharynx clear, no lesions LymphaticsCervical, supraclavicular, and axillary nodes normal. Resp: clear to auscultation bilaterally and normal percussion bilaterally Cardio: irregularly irregular rhythm GI: soft, nontender, not distended, still full RUQ, normal bowel sounds Right inguinal area has  clear fluid leaking from superficial open area, increased with palpation locally. No heat or erythema, skin somewhat macerated, area covered with thick guaze and tape. Extremities: trace edema LLE, none on right Neuro:no sensory deficits noted Portacath-without erythema or tenderness Lab Results:  Results for orders placed in visit on 02/18/12  CBC WITH DIFFERENTIAL      Component Value Range   WBC 2.5 (*) 3.9 - 10.3 10e3/uL   NEUT# 1.5  1.5 - 6.5 10e3/uL   HGB 10.9 (*) 11.6 - 15.9 g/dL   HCT 16.1 (*) 09.6 - 04.5 %   Platelets 228  145 - 400 10e3/uL   MCV 87.6  79.5 - 101.0 fL   MCH 29.8  25.1 - 34.0 pg   MCHC 34.0  31.5 - 36.0 g/dL   RBC 4.09 (*) 8.11 - 9.14 10e6/uL   RDW 15.0 (*) 11.2 - 14.5 %   lymph# 0.5 (*) 0.9 - 3.3 10e3/uL   MONO# 0.4  0.1 - 0.9 10e3/uL   Eosinophils Absolute 0.0  0.0 - 0.5 10e3/uL   Basophils Absolute 0.0  0.0 - 0.1 10e3/uL   NEUT% 59.9  38.4 - 76.8 %   LYMPH% 21.9  14.0 - 49.7 %   MONO% 15.5 (*) 0.0 - 14.0 %   EOS% 1.3  0.0 - 7.0 %   BASO% 1.4  0.0 - 2.0 %  COMPREHENSIVE METABOLIC PANEL (CC13)      Component Value Range   Sodium 139  136 - 145 mEq/L   Potassium 4.2  3.5 - 5.1 mEq/L   Chloride 105  98 - 107 mEq/L   CO2 27  22 - 29 mEq/L   Glucose 108 (*) 70 - 99 mg/dl   BUN 9.0  7.0 - 78.2 mg/dL   Creatinine 0.8  0.6 - 1.1 mg/dL   Total Bilirubin 9.56  0.20 - 1.20 mg/dL   Alkaline Phosphatase 224 (*) 40 - 150 U/L   AST 47 (*) 5 - 34 U/L   ALT 68 (*) 0 - 55 U/L   Total Protein 6.4  6.4 - 8.3 g/dL   Albumin 3.2 (*) 3.5 - 5.0 g/dL   Calcium 9.7  8.4 - 21.3 mg/dL   LFTs a little higher and albumin lower on these chemistries, resulted after visit.  Studies/Results:  No results found.  Medications: I have reviewed the patient's current medications. She will continue to premedicate taxol with decadron 20 mg 12 hrs prior. She has had neupogen 300 mcg given now  Assessment/Plan: 1.Metastatic cervical cancer: post RT to periaortic adenopathy, and  for cycle 2 carbo/taxol 12-24 and 12-31 if ANC >=1.5 and plt >100k. I will see her 03-07-12 or sooner if needed. 2.PAC in 3.recurrent LE DVTs, IVC filter in 4.atrial fibrillation on xarelto 5.increased drainage from area of lymphocele right groin: she will try sanitary pads and will use mycostatin ointment to surrounding skin after air drying several times daily. Will ask Dr Derrell Lolling to see again.    Mairely Foxworth P, MD   02/18/2012, 9:15 PM

## 2012-02-18 NOTE — Telephone Encounter (Signed)
gv and printed pt appt schedule for Dec and Jan 2014.Marland KitchenMarland KitchenCalled Dr. Jacinto Halim office and s/w nurse Britta Mccreedy and she stated that she will call and schedule the appt with the pt.Marland KitchenMarland KitchenMarland Kitchen

## 2012-02-18 NOTE — Telephone Encounter (Signed)
Per call from [facility] scheduled pt to be seen in Urgent office on Monday, 02/21/12, at 2:30 pm for continued drainage from groin wound.  Will see Dr. Derrell Lolling.  LMOM with appt information.

## 2012-02-20 ENCOUNTER — Other Ambulatory Visit: Payer: Self-pay | Admitting: Oncology

## 2012-02-21 ENCOUNTER — Ambulatory Visit (INDEPENDENT_AMBULATORY_CARE_PROVIDER_SITE_OTHER): Payer: 59 | Admitting: General Surgery

## 2012-02-21 ENCOUNTER — Encounter (INDEPENDENT_AMBULATORY_CARE_PROVIDER_SITE_OTHER): Payer: Self-pay | Admitting: General Surgery

## 2012-02-21 VITALS — BP 124/78 | HR 72 | Temp 98.6°F | Resp 16 | Ht 61.0 in | Wt 197.2 lb

## 2012-02-21 DIAGNOSIS — I898 Other specified noninfective disorders of lymphatic vessels and lymph nodes: Secondary | ICD-10-CM

## 2012-02-21 NOTE — Progress Notes (Signed)
Patient ID: [name], female   DOB: [birth year], 61 y.o.   MRN: [id number] The pt this 61 year old female who underwent incision and drainage of a lymphocele by Dr. Derrell Lolling approximately one month ago. Patient she continued to have clear drainage from the area.  There has been no erythema to the wound.  On exam: Wounds clean dry and intact his proximally a 0.5 cm opening the skin there is minimal drainage could be expressed on exam. There is no erythema.  Assessment and plan: 1. Patient will follow up in 2 weeks with Dr. Derrell Lolling to follow her wound care. 2.the patient to continue with home wound care dressings.

## 2012-02-22 ENCOUNTER — Ambulatory Visit (HOSPITAL_BASED_OUTPATIENT_CLINIC_OR_DEPARTMENT_OTHER): Payer: 59

## 2012-02-22 ENCOUNTER — Other Ambulatory Visit (HOSPITAL_BASED_OUTPATIENT_CLINIC_OR_DEPARTMENT_OTHER): Payer: 59 | Admitting: Lab

## 2012-02-22 VITALS — BP 121/82 | HR 96 | Temp 98.3°F

## 2012-02-22 DIAGNOSIS — C539 Malignant neoplasm of cervix uteri, unspecified: Secondary | ICD-10-CM

## 2012-02-22 DIAGNOSIS — Z5111 Encounter for antineoplastic chemotherapy: Secondary | ICD-10-CM

## 2012-02-22 DIAGNOSIS — C787 Secondary malignant neoplasm of liver and intrahepatic bile duct: Secondary | ICD-10-CM

## 2012-02-22 LAB — CBC WITH DIFFERENTIAL/PLATELET
Basophils Absolute: 0 10*3/uL (ref 0.0–0.1)
EOS%: 0 % (ref 0.0–7.0)
Eosinophils Absolute: 0 10*3/uL (ref 0.0–0.5)
HCT: 31.8 % — ABNORMAL LOW (ref 34.8–46.6)
HGB: 10.6 g/dL — ABNORMAL LOW (ref 11.6–15.9)
LYMPH%: 17.4 % (ref 14.0–49.7)
MCH: 29 pg (ref 25.1–34.0)
MCV: 86.9 fL (ref 79.5–101.0)
MONO%: 4.5 % (ref 0.0–14.0)
NEUT#: 2.2 10*3/uL (ref 1.5–6.5)
NEUT%: 77.8 % — ABNORMAL HIGH (ref 38.4–76.8)
Platelets: 214 10*3/uL (ref 145–400)

## 2012-02-22 MED ORDER — SODIUM CHLORIDE 0.9 % IV SOLN
250.0000 mg | Freq: Once | INTRAVENOUS | Status: AC
  Administered 2012-02-22: 250 mg via INTRAVENOUS
  Filled 2012-02-22: qty 25

## 2012-02-22 MED ORDER — SODIUM CHLORIDE 0.9 % IV SOLN
Freq: Once | INTRAVENOUS | Status: AC
  Administered 2012-02-22: 11:00:00 via INTRAVENOUS

## 2012-02-22 MED ORDER — SODIUM CHLORIDE 0.9 % IJ SOLN
10.0000 mL | INTRAMUSCULAR | Status: DC | PRN
  Administered 2012-02-22: 10 mL
  Filled 2012-02-22: qty 10

## 2012-02-22 MED ORDER — ONDANSETRON 16 MG/50ML IVPB (CHCC)
16.0000 mg | Freq: Once | INTRAVENOUS | Status: AC
  Administered 2012-02-22: 16 mg via INTRAVENOUS

## 2012-02-22 MED ORDER — DIPHENHYDRAMINE HCL 50 MG/ML IJ SOLN
50.0000 mg | Freq: Once | INTRAMUSCULAR | Status: AC
  Administered 2012-02-22: 50 mg via INTRAVENOUS

## 2012-02-22 MED ORDER — HEPARIN SOD (PORK) LOCK FLUSH 100 UNIT/ML IV SOLN
500.0000 [IU] | Freq: Once | INTRAVENOUS | Status: AC | PRN
  Administered 2012-02-22: 500 [IU]
  Filled 2012-02-22: qty 5

## 2012-02-22 MED ORDER — FAMOTIDINE IN NACL 20-0.9 MG/50ML-% IV SOLN
20.0000 mg | Freq: Once | INTRAVENOUS | Status: AC
  Administered 2012-02-22: 20 mg via INTRAVENOUS

## 2012-02-22 MED ORDER — DEXAMETHASONE SODIUM PHOSPHATE 4 MG/ML IJ SOLN
20.0000 mg | Freq: Once | INTRAMUSCULAR | Status: AC
  Administered 2012-02-22: 20 mg via INTRAVENOUS

## 2012-02-22 MED ORDER — FOSAPREPITANT DIMEGLUMINE INJECTION 150 MG
150.0000 mg | Freq: Once | INTRAVENOUS | Status: AC
  Administered 2012-02-22: 150 mg via INTRAVENOUS
  Filled 2012-02-22: qty 5

## 2012-02-22 MED ORDER — PACLITAXEL CHEMO INJECTION 300 MG/50ML
80.0000 mg/m2 | Freq: Once | INTRAVENOUS | Status: AC
  Administered 2012-02-22: 156 mg via INTRAVENOUS
  Filled 2012-02-22: qty 26

## 2012-02-22 NOTE — Patient Instructions (Addendum)
[facility] Discharge Instructions for Patients Receiving Chemotherapy  Today you received the following chemotherapy agents Taxol/Carboplatin  To help prevent nausea and vomiting after your treatment, we encourage you to take your nausea medication as prescribed.   If you develop nausea and vomiting that is not controlled by your nausea medication, call the clinic. If it is after clinic hours your family physician or the after hours number for the clinic or go to the Emergency Department.   BELOW ARE SYMPTOMS THAT SHOULD BE REPORTED IMMEDIATELY:  *FEVER GREATER THAN 100.5 F  *CHILLS WITH OR WITHOUT FEVER  NAUSEA AND VOMITING THAT IS NOT CONTROLLED WITH YOUR NAUSEA MEDICATION  *UNUSUAL SHORTNESS OF BREATH  *UNUSUAL BRUISING OR BLEEDING  TENDERNESS IN MOUTH AND THROAT WITH OR WITHOUT PRESENCE OF ULCERS  *URINARY PROBLEMS  *BOWEL PROBLEMS  UNUSUAL RASH Items with * indicate a potential emergency and should be followed up as soon as possible.  Feel free to call the clinic you have any questions or concerns. The clinic phone number is [phone].   I have been informed and understand all the instructions given to me. I know to contact the clinic, my physician, or go to the Emergency Department if any problems should occur. I do not have any questions at this time, but understand that I may call the clinic during office hours   should I have any questions or need assistance in obtaining follow up care.    __________________________________________  _____________  __________ Signature of Patient or Authorized Representative            Date                   Time    __________________________________________ Nurse's Signature    

## 2012-02-28 ENCOUNTER — Other Ambulatory Visit: Payer: Self-pay | Admitting: *Deleted

## 2012-02-28 MED ORDER — RIVAROXABAN 20 MG PO TABS: 20.0000 mg | ORAL_TABLET | Freq: Every day | ORAL | Status: DC

## 2012-02-28 NOTE — Telephone Encounter (Signed)
R'cd fax from [employer] for refill of Xarelto.

## 2012-02-29 ENCOUNTER — Ambulatory Visit (HOSPITAL_BASED_OUTPATIENT_CLINIC_OR_DEPARTMENT_OTHER): Payer: 59

## 2012-02-29 ENCOUNTER — Other Ambulatory Visit (HOSPITAL_BASED_OUTPATIENT_CLINIC_OR_DEPARTMENT_OTHER): Payer: 59 | Admitting: Lab

## 2012-02-29 ENCOUNTER — Telehealth: Payer: Self-pay | Admitting: Nurse Practitioner

## 2012-02-29 ENCOUNTER — Other Ambulatory Visit: Payer: Self-pay | Admitting: Oncology

## 2012-02-29 ENCOUNTER — Ambulatory Visit (HOSPITAL_COMMUNITY)
Admission: RE | Admit: 2012-02-29 | Discharge: 2012-02-29 | Disposition: A | Discharge status: Home / Self Care | Payer: 59 | Source: Ambulatory Visit | Attending: Oncology | Admitting: Oncology

## 2012-02-29 VITALS — BP 132/85 | HR 118 | Temp 97.1°F | Resp 18

## 2012-02-29 DIAGNOSIS — C539 Malignant neoplasm of cervix uteri, unspecified: Secondary | ICD-10-CM

## 2012-02-29 DIAGNOSIS — IMO0001 Reserved for inherently not codable concepts without codable children: Secondary | ICD-10-CM | POA: Insufficient documentation

## 2012-02-29 DIAGNOSIS — C787 Secondary malignant neoplasm of liver and intrahepatic bile duct: Secondary | ICD-10-CM

## 2012-02-29 DIAGNOSIS — Z5111 Encounter for antineoplastic chemotherapy: Secondary | ICD-10-CM

## 2012-02-29 LAB — CBC WITH DIFFERENTIAL/PLATELET
Basophils Absolute: 0 10*3/uL (ref 0.0–0.1)
Eosinophils Absolute: 0 10*3/uL (ref 0.0–0.5)
HGB: 10.3 g/dL — ABNORMAL LOW (ref 11.6–15.9)
MCV: 86.6 fL (ref 79.5–101.0)
MONO#: 0 10*3/uL — ABNORMAL LOW (ref 0.1–0.9)
MONO%: 0.3 % (ref 0.0–14.0)
NEUT#: 2.9 10*3/uL (ref 1.5–6.5)
Platelets: 199 10*3/uL (ref 145–400)
RDW: 15 % — ABNORMAL HIGH (ref 11.2–14.5)

## 2012-02-29 MED ORDER — FAMOTIDINE IN NACL 20-0.9 MG/50ML-% IV SOLN
20.0000 mg | Freq: Once | INTRAVENOUS | Status: AC
  Administered 2012-02-29: 20 mg via INTRAVENOUS

## 2012-02-29 MED ORDER — DIPHENHYDRAMINE HCL 50 MG/ML IJ SOLN
50.0000 mg | Freq: Once | INTRAMUSCULAR | Status: AC
  Administered 2012-02-29: 50 mg via INTRAVENOUS

## 2012-02-29 MED ORDER — ONDANSETRON 8 MG/50ML IVPB (CHCC)
8.0000 mg | Freq: Once | INTRAVENOUS | Status: AC
  Administered 2012-02-29: 8 mg via INTRAVENOUS

## 2012-02-29 MED ORDER — DEXAMETHASONE SODIUM PHOSPHATE 10 MG/ML IJ SOLN
20.0000 mg | Freq: Once | INTRAMUSCULAR | Status: AC
  Administered 2012-02-29: 20 mg via INTRAVENOUS

## 2012-02-29 MED ORDER — PACLITAXEL CHEMO INJECTION 300 MG/50ML
80.0000 mg/m2 | Freq: Once | INTRAVENOUS | Status: AC
  Administered 2012-02-29: 156 mg via INTRAVENOUS
  Filled 2012-02-29: qty 26

## 2012-02-29 MED ORDER — HEPARIN SOD (PORK) LOCK FLUSH 100 UNIT/ML IV SOLN
500.0000 [IU] | Freq: Once | INTRAVENOUS | Status: AC | PRN
  Administered 2012-02-29: 500 [IU]
  Filled 2012-02-29: qty 5

## 2012-02-29 MED ORDER — SODIUM CHLORIDE 0.9 % IJ SOLN
10.0000 mL | INTRAMUSCULAR | Status: DC | PRN
  Administered 2012-02-29: 10 mL
  Filled 2012-02-29: qty 10

## 2012-02-29 MED ORDER — SODIUM CHLORIDE 0.9 % IV SOLN
Freq: Once | INTRAVENOUS | Status: AC
  Administered 2012-02-29: 11:00:00 via INTRAVENOUS

## 2012-02-29 NOTE — Telephone Encounter (Signed)
Spoke with patient regarding xray and pain.  Per Dr. Darrold Span no reason for discomfort noted on hip xray.  Requested more information on patients pain.  Per patient- pain started yesterday, sudden onset after leaning over slightly to wake son. States pain was excruciating in left rear iliac/buttocks area, radiating at times.  States if she is standing still, then pain is not present.  If she moves "a certain way" pain returns.  If she bends over pain is relieved.  States she is comfortable sitting only if sitting on edge of chair.  Reports if sitting, if she moves her right leg, she will feel pain in left "hip" area.  Advised patient will forward further information to Dr. Darrold Span.  Also recommended she call primary care MD as symptoms sound as may be related to back injury.

## 2012-02-29 NOTE — Patient Instructions (Signed)
[facility] Discharge Instructions for Patients Receiving Chemotherapy  Today you received the following chemotherapy agents Taxol  To help prevent nausea and vomiting after your treatment, we encourage you to take your nausea medication as prescribed.  If you develop nausea and vomiting that is not controlled by your nausea medication, call the clinic. If it is after clinic hours your family physician or the after hours number for the clinic or go to the Emergency Department.   BELOW ARE SYMPTOMS THAT SHOULD BE REPORTED IMMEDIATELY:  *FEVER GREATER THAN 100.5 F  *CHILLS WITH OR WITHOUT FEVER  NAUSEA AND VOMITING THAT IS NOT CONTROLLED WITH YOUR NAUSEA MEDICATION  *UNUSUAL SHORTNESS OF BREATH  *UNUSUAL BRUISING OR BLEEDING  TENDERNESS IN MOUTH AND THROAT WITH OR WITHOUT PRESENCE OF ULCERS  *URINARY PROBLEMS  *BOWEL PROBLEMS  UNUSUAL RASH Items with * indicate a potential emergency and should be followed up as soon as possible.  One of the nurses will contact you 24 hours after your treatment. Please let the nurse know about any problems that you may have experienced. Feel free to call the clinic you have any questions or concerns. The clinic phone number is [phone].   I have been informed and understand all the instructions given to me. I know to contact the clinic, my physician, or go to the Emergency Department if any problems should occur. I do not have any questions at this time, but understand that I may call the clinic during office hours   should I have any questions or need assistance in obtaining follow up care.    __________________________________________  _____________  __________ Signature of Patient or Authorized Representative            Date                   Time    __________________________________________ Nurse's Signature    

## 2012-02-29 NOTE — Progress Notes (Signed)
Patient complains of pain in lower left back. Patient states the pain started yesterday. The patient states the pain is not constant, it is only noticed when moving in certain positions. Patient has prescribed pain medication at home, which does not offer relief when the pain is happening. Patient denies pain in clinic today.This RN spoke with Dr. Darrold Span about the patient's pain. Dr. Darrold Span stated she would order x rays.

## 2012-03-01 DIAGNOSIS — M8080XA Other osteoporosis with current pathological fracture, unspecified site, initial encounter for fracture: Secondary | ICD-10-CM

## 2012-03-01 HISTORY — DX: Other osteoporosis with current pathological fracture, unspecified site, initial encounter for fracture: M80.80XA

## 2012-03-03 ENCOUNTER — Encounter: Payer: Self-pay | Admitting: Internal Medicine

## 2012-03-03 ENCOUNTER — Ambulatory Visit (INDEPENDENT_AMBULATORY_CARE_PROVIDER_SITE_OTHER): Payer: 59 | Admitting: Internal Medicine

## 2012-03-03 ENCOUNTER — Ambulatory Visit (INDEPENDENT_AMBULATORY_CARE_PROVIDER_SITE_OTHER)
Admission: RE | Admit: 2012-03-03 | Discharge: 2012-03-03 | Disposition: A | Discharge status: Home / Self Care | Payer: 59 | Source: Ambulatory Visit | Attending: Internal Medicine | Admitting: Internal Medicine

## 2012-03-03 VITALS — BP 122/82 | HR 104 | Temp 97.9°F | Ht 62.0 in | Wt 191.8 lb

## 2012-03-03 DIAGNOSIS — M5416 Radiculopathy, lumbar region: Secondary | ICD-10-CM

## 2012-03-03 DIAGNOSIS — M549 Dorsalgia, unspecified: Secondary | ICD-10-CM

## 2012-03-03 DIAGNOSIS — C539 Malignant neoplasm of cervix uteri, unspecified: Secondary | ICD-10-CM

## 2012-03-03 DIAGNOSIS — IMO0002 Reserved for concepts with insufficient information to code with codable children: Secondary | ICD-10-CM

## 2012-03-03 MED ORDER — TIZANIDINE HCL 4 MG PO CAPS: 4.0000 mg | ORAL_CAPSULE | Freq: Three times a day (TID) | ORAL | Status: DC | PRN

## 2012-03-03 MED ORDER — PREDNISONE (PAK) 10 MG PO TABS: 10.0000 mg | ORAL_TABLET | ORAL | Status: DC

## 2012-03-03 NOTE — Progress Notes (Signed)
Subjective:    Patient ID: UVA RUNKEL, female    DOB: 1951-02-06, 62 y.o.   MRN: [id number]  Back Pain This is a new problem. The current episode started in the past 7 days. The problem occurs constantly. The pain is present in the lumbar spine. The quality of the pain is described as aching, cramping and stabbing. The pain does not radiate. The pain is moderate. The pain is the same all the time. The symptoms are aggravated by position, twisting and lying down. Stiffness is present all day. Associated symptoms include weakness (left thigh unstable with standing from seated position). Pertinent negatives include no bladder incontinence, bowel incontinence, fever, headaches, leg pain or numbness. Risk factors include history of cancer and obesity. She has tried chiropractic manipulation and NSAIDs for the symptoms. The treatment provided mild relief.    Past Medical History  Diagnosis Date  . Diastolic dysfunction   . MIGRAINE HEADACHE   . Atrial fibrillation     failed DCCV, CHADs2-prev pradaxa stopped due to hematuria with ureter issues/JJ   . Anemia   . HTN (hypertension)   . GLAUCOMA   . CARPAL TUNNEL SYNDROME, RIGHT   . Lymphedema     L>R leg from pelvic XRT/scarring  . Hepatic steatosis     CT scan 06/2009, 12/2009  . DVT (deep venous thrombosis) 10/2010    LLE, anticaog resumed  . CHF (congestive heart failure)     1 yr ago per pt  . Dyspnea on exertion   . Arthritis   . GERD (gastroesophageal reflux disease)   . Lesion of liver   . Small kidney     left  . Difficulty sleeping   . Radiation 10/11/11-11/19/11    5040 cGy in 28 fx's/periaortic  . Radiation 03/16/10-03/25/10    right inguinal node/left external iliac node  . Radiation 06/11/08-07/23/08    6000 cGy left pelvis  . Clotting disorder   . Hyperlipidemia   . Leg swelling   . Nausea   . Bruises easily   . Weakness   . Cervical cancer dx 04/2003, recur 04/2008    s/p debulk (TAHBSO), chemo/XRT; recurrent dz L  pelvis dx 2010 and liver met 09/2010 s/p RFA  . Recurrent cervical cancer     Review of Systems  Constitutional: Negative for fever and fatigue.  Gastrointestinal: Negative for bowel incontinence.  Genitourinary: Negative for bladder incontinence.  Musculoskeletal: Positive for back pain. Negative for myalgias.  Neurological: Positive for weakness (left thigh unstable with standing from seated position). Negative for numbness and headaches.       Objective:   Physical Exam BP 122/82  Pulse 104  Temp 97.9 F (36.6 C) (Oral)  Ht 5\' 2"  (1.575 m)  Wt 191 lb 12.8 oz (87 kg)  BMI 35.08 kg/m2  SpO2 97% Wt Readings from Last 3 Encounters:  03/03/12 191 lb 12.8 oz (87 kg)  02/21/12 197 lb 3.2 oz (89.449 kg)  02/18/12 193 lb 6.4 oz (87.726 kg)   Constitutional: She is obese, but appears well-developed and well-nourished. Uncomfortable but no distress.  Neck: Normal range of motion. Neck supple. No JVD present. No thyromegaly present.  Cardiovascular: Normal rate, regular rhythm and normal heart sounds.  No murmur heard. No BLE edema. Pulmonary/Chest: Effort normal and breath sounds normal. No respiratory distress. She has no wheezes.  Musculoskeletal: Back: full range of motion of thoracic and lumbar spine. Non tender to palpation. Negative straight leg raise. DTR's are symmetrically intact. Sensation intact  in all dermatomes of the lower extremities. Full strength to manual muscle testing. patient is able to heel toe walk without difficulty and ambulates with antalgic gait. Psychiatric: She has a normal mood and affect. Her behavior is normal. Judgment and thought content normal.   Lab Results  Component Value Date   WBC 3.2* 02/29/2012   HGB 10.3* 02/29/2012   HCT 31.1* 02/29/2012   PLT 199 02/29/2012   GLUCOSE 108* 02/18/2012   ALT 68* 02/18/2012   AST 47* 02/18/2012   NA 139 02/18/2012   K 4.2 02/18/2012   CL 105 02/18/2012   CREATININE 0.8 02/18/2012   BUN 9.0 02/18/2012    CO2 27 02/18/2012   TSH 1.700 05/16/2010   INR 1.02 01/20/2012   HGBA1C 5.2 09/19/2008   Dg Hip Complete Left  02/29/2012  *RADIOLOGY REPORT*  Clinical Data: Pain in left buttocks and iliac region  LEFT HIP - COMPLETE 2+ VIEW  Comparison: Left knee radiographs 10/19/2010  Findings: The left hip is located.  The joint spaces of both hips are symmetric.  No significant joint space narrowing.  Negative for osteophyte formation.  The pelvic ring appears intact.  No acute or healing fracture is identified.  Negative for diastasis.  Surgical clips project over the right lower abdomen and pelvis bilaterally. Inferior aspect of an inferior vena cava filter seem tp the right of midline in the lower abdomen.  IMPRESSION: No acute bony abnormality or significant degenerative change identified.   Original Report Authenticated By: Britta Mccreedy, M.D.      Assessment & Plan:   Acute left lumbar radiculopathy - onset 5 days ago Mild motor weakness on hip flexion due to pain  Check plain film and MRI L spine tx 6 d pred pak and muscle relaxer -  Consider refer to Nsurg or ortho spine as needed based on MRI

## 2012-03-03 NOTE — Assessment & Plan Note (Signed)
Dx 04/2003: s/p chemo/debulk/XRT Recurrent dx 2/2011L pelvic wall and liver met - resumed chemo 09/2010 - 03/05/2011 Multiple complications related to same and renal involvement/obstruction -  DVT LLE 10/2010 and 04/2011 likely related to hypercoagulable state from malignancy - s/p IVC filter 04/2011 Hepatic metastatic lesion RF ablation done 05/07/11 by  interventional radiology  L flank discomfort related to increase LN size on 05/2011 CT and PET 08/2011> increase L RP LN activity - started XRT for same 09/2011 - ongoing now continue oxycodone (changed to XR 08/2011 and continued breakthru percocet prn) for pain as needed -  continue Miralax to combat constipation - Hematuria x 2 since starting XRT (hx same with ureter lesion during 2011 XRT) - complicated by anticoag

## 2012-03-03 NOTE — Patient Instructions (Signed)
It was good to see you today. Test(s) ordered today. Your results will be released to MyChart (or called to you) after review, usually within 72hours after test completion. If any changes need to be made, you will be notified at that same time. we'll make referral for MRI back (open) . Our office will contact you regarding appointment(s) once made. What we do next depends on what we find.... In meanwhile, prednisone taper x 6 days and Zanaflex for muscle relaxer - Your prescription(s) have been submitted to your pharmacy. Please take as directed and contact our office if you believe you are having problem(s) with the medication(s). Ok to use your oxy as needed for sever pain

## 2012-03-07 ENCOUNTER — Other Ambulatory Visit: Payer: Self-pay | Admitting: Internal Medicine

## 2012-03-07 ENCOUNTER — Telehealth: Payer: Self-pay

## 2012-03-07 ENCOUNTER — Telehealth: Payer: Self-pay | Admitting: Internal Medicine

## 2012-03-07 MED ORDER — METOPROLOL TARTRATE 50 MG PO TABS: 50.0000 mg | ORAL_TABLET | Freq: Two times a day (BID) | ORAL | Status: DC

## 2012-03-07 NOTE — Telephone Encounter (Signed)
Notified pt with md response.../lmb 

## 2012-03-07 NOTE — Telephone Encounter (Signed)
Patient is calling to get the results of her x-ray from last friday

## 2012-03-07 NOTE — Telephone Encounter (Signed)
[name] called stating that she thought she misunderstood her treatment schedule as she thought she would have a treatment D1 on ~ 03-14-12 and D8 ~03-21-12  but she has not appointments scheduled until 03-20-12 to see Dr. Darrold Span. Her last treatment was D8 cycle 2 on 02-29-12. Reviewed appointment orders from Dr. Darrold Span  on 02-18-12 visit  and she was to see patient  today 03-07-12.  Apologized to [name] for the confusion with appointments.  Will give this note to Dr. Darrold Span  to review tomorrow.  Our office will be in contact with her once Dr. Darrold Span determines a  schedule plan.

## 2012-03-07 NOTE — Telephone Encounter (Signed)
Message was released to my chart as follows:   Entered by Newt Lukes, MD at 03/03/2012 5:06 PM Lower back arthritis changes noted on xray as expected -  Will wait MRI to look for "slipped disc" or other back problem causing pain symptoms  No treatment changes recommended at this time

## 2012-03-08 ENCOUNTER — Encounter: Payer: Self-pay | Admitting: Oncology

## 2012-03-08 ENCOUNTER — Other Ambulatory Visit: Payer: Self-pay | Admitting: Oncology

## 2012-03-08 ENCOUNTER — Other Ambulatory Visit: Payer: Self-pay | Admitting: *Deleted

## 2012-03-08 ENCOUNTER — Ambulatory Visit (HOSPITAL_BASED_OUTPATIENT_CLINIC_OR_DEPARTMENT_OTHER): Payer: 59 | Admitting: Lab

## 2012-03-08 ENCOUNTER — Ambulatory Visit (HOSPITAL_BASED_OUTPATIENT_CLINIC_OR_DEPARTMENT_OTHER): Payer: 59 | Admitting: Oncology

## 2012-03-08 VITALS — BP 95/59 | HR 91 | Temp 97.1°F | Resp 18 | Ht 62.0 in | Wt 198.2 lb

## 2012-03-08 DIAGNOSIS — C539 Malignant neoplasm of cervix uteri, unspecified: Secondary | ICD-10-CM

## 2012-03-08 DIAGNOSIS — C787 Secondary malignant neoplasm of liver and intrahepatic bile duct: Secondary | ICD-10-CM

## 2012-03-08 DIAGNOSIS — I82409 Acute embolism and thrombosis of unspecified deep veins of unspecified lower extremity: Secondary | ICD-10-CM

## 2012-03-08 DIAGNOSIS — I4891 Unspecified atrial fibrillation: Secondary | ICD-10-CM

## 2012-03-08 LAB — COMPREHENSIVE METABOLIC PANEL (CC13)
ALT: 41 U/L (ref 0–55)
AST: 27 U/L (ref 5–34)
Alkaline Phosphatase: 176 U/L — ABNORMAL HIGH (ref 40–150)
BUN: 14 mg/dL (ref 7.0–26.0)
Calcium: 9.8 mg/dL (ref 8.4–10.4)
Chloride: 106 mEq/L (ref 98–107)
Creatinine: 0.9 mg/dL (ref 0.6–1.1)
Potassium: 4.3 mEq/L (ref 3.5–5.1)

## 2012-03-08 LAB — CBC WITH DIFFERENTIAL/PLATELET
BASO%: 1 % (ref 0.0–2.0)
EOS%: 1.3 % (ref 0.0–7.0)
MCH: 30.7 pg (ref 25.1–34.0)
MCHC: 34.4 g/dL (ref 31.5–36.0)
MCV: 89.2 fL (ref 79.5–101.0)
MONO%: 18.6 % — ABNORMAL HIGH (ref 0.0–14.0)
NEUT%: 53.1 % (ref 38.4–76.8)
RDW: 16.9 % — ABNORMAL HIGH (ref 11.2–14.5)
lymph#: 0.4 10*3/uL — ABNORMAL LOW (ref 0.9–3.3)

## 2012-03-08 MED ORDER — FILGRASTIM 300 MCG/0.5ML IJ SOLN
300.0000 ug | Freq: Once | INTRAMUSCULAR | Status: AC
  Administered 2012-03-08: 300 ug via SUBCUTANEOUS
  Filled 2012-03-08: qty 0.5

## 2012-03-08 NOTE — Progress Notes (Signed)
OFFICE PROGRESS NOTE   03/08/2012   Physicians:J.Kinard, V.Leschber, D.ClarkePearson, J.Allred, D.Margarita Grizzle, H.Derrell Lolling.  INTERVAL HISTORY:   Patient is seen, together with significant other, in continuing attention to her metastatic cervical carcinoma which has recently progressed in liver, cycle 2 carboplatin with day 1 and day 8 taxol given 12-24 and 02-29-12. She has not had gCSF with this cycle, and is neutropenic today tho appears to be past nadir. Drainage continues from the right inguinal lymphocoele. She had acute onset of low back pain on 02-28-12 when bending over couch, precert still in process for MRI (on open scanner due to claustrophobia) by Dr Felicity Coyer.  History is of radical hysterectomy, bilateral salpingo-oophorectomy and pelvic lymphadenectomy in March of 2005 by Dr Ronita Hipps for a stage IB cervical cancer. Final pathology showed no residual disease and all lymph nodes were negative. The patient was followed for 5 years with no evidence of recurrent disease until she developed a recurrence of the right pelvic sidewall in February of 2010. This caused obstruction of the right ureter. She was treated with radiation therapy and concurrent cisplatin chemotherapy. In late 2011 she had recurrence in right inguinal lymph node and left pelvic nodes and received additional radiation therapy to those areas, completed in January 2012. In August 2012 the patient was found to have a 2.8 cm liver lesion and received 6 cycles of Taxol + one cycle of carboplatin. She had a partial response to chemo, then increase in the solitary liver lesion and RFA was performed on 10/07/2011. 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 After ~ 3 months she was changed to xarelto by Dr Felicity Coyer because of local bruising and discomfort from the lovenox injections; she continues  xarelto now. She had progression in left periaortic nodes which was symptomatic with pain and treated by Dr Roselind Messier with 5040 cGY in 28 fractions from 10-11-11 thru 11-19-11. Pain has improved since radiation and she has been able to decrease pain medication to present oycontin 20 mg q 12 hrs with prn percocet. CT CAP repeated 12-31-11 found multiple new liver lesions, small left inguinal nodes and a 5 cm fluid collection right groin. Chemotherapy was resumed with the low dose carboplatin and taxol as above on 02-01-12.  Other significant comorbidities include atrophic left kidney (previously stented without improvement), atrial fibrillation and HTN. She continues xarelto.  Recent acute onset of low back pain as above, initially radiating down legs bilaterally, minimally improved with muscle relaxants, not significantly with percocet (no longer using oxycontin). Area of lymphocoele still draining mostly clear/ occasional pinkish fluid, changes dressings 4-5x in 24 hours. No fever. No significant nausea. No increased SOB. A little more constipated. No increased LE swelling. No other bleeding. No new or different pain otherwise. No fever or symptoms of infection. She denies orthostatic symptoms, can check BP at home. Remainder of 10 point Review of Systems negative.  Objective:  Vital signs in last 24 hours:  BP 95/59  Pulse 91  Temp 97.1 F (36.2 C) (Oral)  Resp 18  Ht 5\' 2"  (1.575 m)  Wt 198 lb 3.2 oz (89.903 kg)  BMI 36.25 kg/m2 Weight is up 5 lbs. Alert, moving very carefully and sitting straight upright due to the low back pain. Respirations not labored RA. Exam done in chair only HEENT:PERRLA, sclera clear, anicteric and oropharynx clear, no lesions LymphaticsCervical, supraclavicular,  and axillary nodes normal. Resp: clear to auscultation bilaterally Cardio: irregularly irregular rhythm GI: soft, full, few bowel sounds, no rubs, not tender in epigastrium Extremities: LLE larger than RLE as  previously, but no pitting pedal edema Neuro:nonfocal other than with back symptoms No central catheter Skin without rash including low back, no ecchymoses  Lab Results:  Results for orders placed in visit on 03/08/12  CBC WITH DIFFERENTIAL      Component Value Range   WBC 1.5 (*) 3.9 - 10.3 10e3/uL   NEUT# 0.8 (*) 1.5 - 6.5 10e3/uL   HGB 9.7 (*) 11.6 - 15.9 g/dL   HCT 40.9 (*) 81.1 - 91.4 %   Platelets 213  145 - 400 10e3/uL   MCV 89.2  79.5 - 101.0 fL   MCH 30.7  25.1 - 34.0 pg   MCHC 34.4  31.5 - 36.0 g/dL   RBC 7.82 (*) 9.56 - 2.13 10e6/uL   RDW 16.9 (*) 11.2 - 14.5 %   lymph# 0.4 (*) 0.9 - 3.3 10e3/uL   MONO# 0.3  0.1 - 0.9 10e3/uL   Eosinophils Absolute 0.0  0.0 - 0.5 10e3/uL   Basophils Absolute 0.0  0.0 - 0.1 10e3/uL   NEUT% 53.1  38.4 - 76.8 %   LYMPH% 26.0  14.0 - 49.7 %   MONO% 18.6 (*) 0.0 - 14.0 %   EOS% 1.3  0.0 - 7.0 %   BASO% 1.0  0.0 - 2.0 %  COMPREHENSIVE METABOLIC PANEL (CC13)      Component Value Range   Sodium 137  136 - 145 mEq/L   Potassium 4.3  3.5 - 5.1 mEq/L   Chloride 106  98 - 107 mEq/L   CO2 24  22 - 29 mEq/L   Glucose 105 (*) 70 - 99 mg/dl   BUN 08.6  7.0 - 57.8 mg/dL   Creatinine 0.9  0.6 - 1.1 mg/dL   Total Bilirubin 4.69  0.20 - 1.20 mg/dL   Alkaline Phosphatase 176 (*) 40 - 150 U/L   AST 27  5 - 34 U/L   ALT 41  0 - 55 U/L   Total Protein 6.6  6.4 - 8.3 g/dL   Albumin 3.3 (*) 3.5 - 5.0 g/dL   Calcium 9.8  8.4 - 62.9 mg/dL     Studies/Results: LUMBAR SPINE - 2-3 VIEW  03-03-12 Comparison: CT of the abdomen pelvis 03/12/2011.  Findings: IVC filter is in place. Prior cholecystectomy. Normal  alignment. Mild degenerative disc disease changes with disc space  narrowing and spurring. No fracture. Mild degenerative facet  disease from L3-4 through L5 S1.  IMPRESSION:  Mild degenerative disc and facet disease. No acute bony  abnormality.   No results found.  Medications: I have reviewed the patient's current medications. Patient has  been given neupogen 300 mcg today as one time dose, now day 16 from the carboplatin. I rewrote Perccet 10/325 #30 and we will send in decadron prescription  I spoke with central radiology scheduling and with care coordinator with Dr Diamantina Monks office, with information that precert is still pending (that office to follow up with insurance today) and that scan has been requested at [facility] in open scanner when authorized. I suggested that patient premed the MRI with percocet and can also use ativan if needed. I spoke with Dr Jacinto Halim office, appointment made for 03-14-12 at 2 pm.  Assessment/Plan:  1.Metastatic cervical cancer to liver, inguinal nodes and previously periaortic nodes (post RT to periaortics): continuing  chemo with carboplatin and taxol. We will delay day 1 cycle 3 until 03-21-12 so that the back symptoms can be addressed further and to have her seen back by Dr Derrell Lolling for lymphocele. 2.acute back pain: history suggests disc as no compression fx seen on plain films and onset not suggestive of retroperitoneal progression. Agree with MRI pending 3.atrial fibrillation and recurrent LE DVT: on xarelto 4.IVC filter in 5.right inguinal lymphocele: I&D by Dr Derrell Lolling 01-10-13, has continued to drain. 6.atrophic left kidney  I will see her back day 1 cycle 3 carbo taxol on 03-21-12. Patient was in agreement with plan above  Lelar Farewell P, MD   03/08/2012, 8:19 PM

## 2012-03-08 NOTE — Patient Instructions (Signed)
Dr Derrell Lolling Jan 14 2 pm  If aches from neupogen, claritin (lortadine) can help  Call if temperature >=100.5 or symptoms of infection  Dr Diamantina Monks office is working on [employer] from Community education officer for MRI at [facility] in open MRI scanner.  Call her RN if you don't hear by Fri 1-10 am ( they may let you speak to care coordinator instead of the RN if so)

## 2012-03-09 ENCOUNTER — Telehealth: Payer: Self-pay | Admitting: *Deleted

## 2012-03-09 ENCOUNTER — Other Ambulatory Visit: Payer: Self-pay

## 2012-03-09 DIAGNOSIS — C539 Malignant neoplasm of cervix uteri, unspecified: Secondary | ICD-10-CM

## 2012-03-09 MED ORDER — DEXAMETHASONE 4 MG PO TABS: ORAL_TABLET | ORAL | Status: DC

## 2012-03-09 NOTE — Telephone Encounter (Signed)
Per staff message and POF I have scheduled appts.  JMW  

## 2012-03-10 ENCOUNTER — Other Ambulatory Visit: Payer: Self-pay

## 2012-03-10 DIAGNOSIS — C539 Malignant neoplasm of cervix uteri, unspecified: Secondary | ICD-10-CM

## 2012-03-10 MED ORDER — OXYCODONE-ACETAMINOPHEN 10-325 MG PO TABS: ORAL_TABLET | ORAL | Status: DC

## 2012-03-13 ENCOUNTER — Ambulatory Visit
Admission: RE | Admit: 2012-03-13 | Discharge: 2012-03-13 | Disposition: A | Discharge status: Home / Self Care | Payer: 59 | Source: Ambulatory Visit | Attending: Internal Medicine | Admitting: Internal Medicine

## 2012-03-13 DIAGNOSIS — M549 Dorsalgia, unspecified: Secondary | ICD-10-CM

## 2012-03-13 DIAGNOSIS — M5416 Radiculopathy, lumbar region: Secondary | ICD-10-CM

## 2012-03-13 DIAGNOSIS — C539 Malignant neoplasm of cervix uteri, unspecified: Secondary | ICD-10-CM

## 2012-03-14 ENCOUNTER — Encounter (INDEPENDENT_AMBULATORY_CARE_PROVIDER_SITE_OTHER): Payer: Self-pay | Admitting: General Surgery

## 2012-03-14 ENCOUNTER — Ambulatory Visit (INDEPENDENT_AMBULATORY_CARE_PROVIDER_SITE_OTHER): Payer: 59 | Admitting: General Surgery

## 2012-03-14 VITALS — BP 108/60 | HR 76 | Temp 97.9°F | Resp 16 | Ht 61.5 in | Wt 199.4 lb

## 2012-03-14 DIAGNOSIS — L02219 Cutaneous abscess of trunk, unspecified: Secondary | ICD-10-CM

## 2012-03-14 DIAGNOSIS — L02214 Cutaneous abscess of groin: Secondary | ICD-10-CM

## 2012-03-14 DIAGNOSIS — L03319 Cellulitis of trunk, unspecified: Secondary | ICD-10-CM

## 2012-03-14 NOTE — Patient Instructions (Signed)
The open draining wound in your right groin does not look infected, and I do not think we can do anything further to stop the drainage. The cause is the history of recurrent cancer in that location, the history of radiation therapy in that location, there really is no surgical technique or medical therapy that will cause it to close up.  The main goals here are to minimize pain and avoid infection.  You may bathe normally. Keep a dry gauze bandage on this and change as necessary.  Return to see Dr. Derrell Lolling if further problems arise.

## 2012-03-14 NOTE — Progress Notes (Signed)
Patient ID: [name], female   DOB: 04/24/1950, 62 y.o.   MRN: [id number] History: This patient returns to see me because of continued drainage from her right groin. This patient has recurrent metastatic cervical cancer. She developed a recurrence of the right pelvic sidewall in 2010 which obstructed the right ureter. She has had radiofrequency ablation of liver metastasis. She had recurrence in the right inguinal lymph nodes and received radiation therapy to those areas completed in January 2012. She's had chemotherapy intermittently and continues to be treated aggressively. On 01/21/2012 she had a red, painful, tender fluid collection in the right groin. I took her to the operating room and performed incision and drainage. This turned out to be a seroma or lymphocele. Cultures were negative. The pain is much better now but she still drains. She says the drainage is watery. Some days it drains more than others. Lately it has been minimal and she's not been uncomfortable. Other  comorbidities include atrophic left kidney, atrial fibrillation on xaralto, hypertension, obesity  Exam: Patient is pleasant. She moves slowly because of back pain. She is ambulating independently. Right groin shows a small open wound,  5 mm in diameter. Skin is thickened chronically for radiation therapy but there is no erythema, no sign of infection, no abscess, no undrained fluid collection. Watery fluid on the bandage, small amount. Redressed Chronic lymphedema left leg  Assessment lymphocele right groin, status post drainage. Considering the history of recurrent cancer in that location and the history of radiation therapy I do not think that this wound will ever heal. I do not think there is any medical or surgical therapy that will cause this to heal. The goals of therapy in this circumstance are to control pain and to prevent infection. Metastatic cervical cancer  Plan: I discussed my impression with the patient. She  is accepting of this Wound care discussed. No ointment  or creams or medications. Simply bathe once or twice daily to keep dry gauze bandage to collect drainage Return to see me if further problems arise.   Angelia Mould. Derrell Lolling, M.D., [facility] General and Minimally invasive Surgery Breast and Colorectal Surgery Office:   [phone] Pager:   [phone]

## 2012-03-15 ENCOUNTER — Other Ambulatory Visit: Payer: Self-pay | Admitting: Internal Medicine

## 2012-03-15 DIAGNOSIS — S32030A Wedge compression fracture of third lumbar vertebra, initial encounter for closed fracture: Secondary | ICD-10-CM

## 2012-03-15 DIAGNOSIS — C539 Malignant neoplasm of cervix uteri, unspecified: Secondary | ICD-10-CM

## 2012-03-20 ENCOUNTER — Other Ambulatory Visit: Payer: 59 | Admitting: Lab

## 2012-03-20 ENCOUNTER — Ambulatory Visit: Payer: 59 | Admitting: Oncology

## 2012-03-21 ENCOUNTER — Other Ambulatory Visit: Payer: Self-pay

## 2012-03-21 ENCOUNTER — Telehealth: Payer: Self-pay | Admitting: Oncology

## 2012-03-21 ENCOUNTER — Other Ambulatory Visit: Payer: Self-pay | Admitting: Internal Medicine

## 2012-03-21 ENCOUNTER — Encounter: Payer: Self-pay | Admitting: Oncology

## 2012-03-21 ENCOUNTER — Telehealth: Payer: Self-pay | Admitting: *Deleted

## 2012-03-21 ENCOUNTER — Ambulatory Visit (HOSPITAL_BASED_OUTPATIENT_CLINIC_OR_DEPARTMENT_OTHER): Payer: 59

## 2012-03-21 ENCOUNTER — Ambulatory Visit (HOSPITAL_BASED_OUTPATIENT_CLINIC_OR_DEPARTMENT_OTHER): Payer: 59 | Admitting: Oncology

## 2012-03-21 ENCOUNTER — Other Ambulatory Visit (HOSPITAL_BASED_OUTPATIENT_CLINIC_OR_DEPARTMENT_OTHER): Payer: 59 | Admitting: Lab

## 2012-03-21 VITALS — BP 128/86 | HR 67 | Temp 97.0°F | Resp 20 | Ht 61.5 in | Wt 197.7 lb

## 2012-03-21 DIAGNOSIS — C539 Malignant neoplasm of cervix uteri, unspecified: Secondary | ICD-10-CM

## 2012-03-21 DIAGNOSIS — IMO0002 Reserved for concepts with insufficient information to code with codable children: Secondary | ICD-10-CM

## 2012-03-21 DIAGNOSIS — C787 Secondary malignant neoplasm of liver and intrahepatic bile duct: Secondary | ICD-10-CM

## 2012-03-21 DIAGNOSIS — C778 Secondary and unspecified malignant neoplasm of lymph nodes of multiple regions: Secondary | ICD-10-CM

## 2012-03-21 DIAGNOSIS — Z5111 Encounter for antineoplastic chemotherapy: Secondary | ICD-10-CM

## 2012-03-21 DIAGNOSIS — M549 Dorsalgia, unspecified: Secondary | ICD-10-CM

## 2012-03-21 LAB — CBC WITH DIFFERENTIAL/PLATELET
BASO%: 0 % (ref 0.0–2.0)
EOS%: 0 % (ref 0.0–7.0)
MCHC: 33.4 g/dL (ref 31.5–36.0)
MONO#: 0.1 10*3/uL (ref 0.1–0.9)
RBC: 3.9 10*6/uL (ref 3.70–5.45)
WBC: 5.1 10*3/uL (ref 3.9–10.3)
lymph#: 0.5 10*3/uL — ABNORMAL LOW (ref 0.9–3.3)
nRBC: 0 % (ref 0–0)

## 2012-03-21 MED ORDER — ONDANSETRON 16 MG/50ML IVPB (CHCC)
16.0000 mg | Freq: Once | INTRAVENOUS | Status: AC
  Administered 2012-03-21: 16 mg via INTRAVENOUS

## 2012-03-21 MED ORDER — DEXAMETHASONE SODIUM PHOSPHATE 4 MG/ML IJ SOLN: 20.0000 mg | Freq: Once | INTRAMUSCULAR | Status: DC

## 2012-03-21 MED ORDER — SODIUM CHLORIDE 0.9 % IJ SOLN
10.0000 mL | INTRAMUSCULAR | Status: DC | PRN
  Administered 2012-03-21: 10 mL
  Filled 2012-03-21: qty 10

## 2012-03-21 MED ORDER — FAMOTIDINE IN NACL 20-0.9 MG/50ML-% IV SOLN
20.0000 mg | Freq: Once | INTRAVENOUS | Status: AC
  Administered 2012-03-21: 20 mg via INTRAVENOUS

## 2012-03-21 MED ORDER — PACLITAXEL CHEMO INJECTION 300 MG/50ML
80.0000 mg/m2 | Freq: Once | INTRAVENOUS | Status: AC
  Administered 2012-03-21: 156 mg via INTRAVENOUS
  Filled 2012-03-21: qty 26

## 2012-03-21 MED ORDER — SODIUM CHLORIDE 0.9 % IV SOLN
240.0000 mg | Freq: Once | INTRAVENOUS | Status: AC
  Administered 2012-03-21: 240 mg via INTRAVENOUS
  Filled 2012-03-21: qty 24

## 2012-03-21 MED ORDER — DIPHENHYDRAMINE HCL 50 MG/ML IJ SOLN
50.0000 mg | Freq: Once | INTRAMUSCULAR | Status: AC
  Administered 2012-03-21: 50 mg via INTRAVENOUS

## 2012-03-21 MED ORDER — SODIUM CHLORIDE 0.9 % IV SOLN
Freq: Once | INTRAVENOUS | Status: AC
  Administered 2012-03-21: 12:00:00 via INTRAVENOUS

## 2012-03-21 MED ORDER — SODIUM CHLORIDE 0.9 % IV SOLN
150.0000 mg | Freq: Once | INTRAVENOUS | Status: AC
  Administered 2012-03-21: 150 mg via INTRAVENOUS
  Filled 2012-03-21: qty 5

## 2012-03-21 MED ORDER — LORAZEPAM 1 MG PO TABS: ORAL_TABLET | ORAL | Status: DC

## 2012-03-21 MED ORDER — HEPARIN SOD (PORK) LOCK FLUSH 100 UNIT/ML IV SOLN
500.0000 [IU] | Freq: Once | INTRAVENOUS | Status: AC | PRN
  Administered 2012-03-21: 500 [IU]
  Filled 2012-03-21: qty 5

## 2012-03-21 MED ORDER — DEXAMETHASONE SODIUM PHOSPHATE 4 MG/ML IJ SOLN
12.0000 mg | Freq: Once | INTRAMUSCULAR | Status: AC
  Administered 2012-03-21: 12 mg via INTRAVENOUS

## 2012-03-21 NOTE — Telephone Encounter (Signed)
Emailed michelle to add tx.Marland KitchenMarland KitchenMarland KitchenMarland Kitchenpt aware

## 2012-03-21 NOTE — Telephone Encounter (Signed)
gv and printed appt schedule for pt....gv pt Barium..the patient aware central scheduling will contact with d/t of ct.

## 2012-03-21 NOTE — Patient Instructions (Addendum)
Interventional Radiology at [facility] hospital will be calling you about the consultation for possible kyphoplasty.  We will get CT AP shortly before my next visit, after this third cycle of chemo which begins today

## 2012-03-21 NOTE — Progress Notes (Signed)
OFFICE PROGRESS NOTE   03/21/2012   Physicians:J.Kinard, V.Leschber, D.ClarkePearson, J.Allred, D.Margarita Grizzle, H.Derrell Lolling   INTERVAL HISTORY:  Patient is seen, together with significant other, in scheduled follow up of her metastatic cervical carcinoma, most recently on carbo/taxol for progressive disease in liver.  History is of radical hysterectomy, bilateral salpingo-oophorectomy and pelvic lymphadenectomy in March of 2005 by Dr Ronita Hipps for a stage IB cervical cancer. Final pathology showed no residual disease and all lymph nodes were negative. The patient was followed for 5 years with no evidence of recurrent disease until she developed a recurrence of the right pelvic sidewall in February of 2010. This caused obstruction of the right ureter. She was treated with radiation therapy and concurrent cisplatin chemotherapy. In late 2011 she had recurrence in right inguinal lymph node and left pelvic nodes and received additional radiation therapy to those areas, completed in January 2012. In August 2012 the patient was found to have a 2.8 cm liver lesion and received 6 cycles of Taxol + one cycle of carboplatin. She had a partial response to chemo, then increase in the solitary liver lesion and RFA was performed on 10/07/2011. 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 After ~ 3 months she was changed to xarelto by Dr Felicity Coyer because of local bruising and discomfort from the lovenox injections; she continues xarelto now. She had progression in left periaortic nodes which was symptomatic with pain and treated by Dr Roselind Messier with 5040 cGY in 28 fractions from 10-11-11 thru 11-19-11. Pain has improved since radiation and she has been able to decrease pain medication to present oycontin 20 mg q 12 hrs with prn percocet. CT CAP repeated 12-31-11 found multiple new liver  lesions, small left inguinal nodes and a 5 cm fluid collection right groin. Chemotherapy was resumed with low dose carboplatin and taxol on 02-01-12 (carbo AUC=2 day 1 and taxol 80 mg/m2 day 1 and day 8), with cycle 2 given 12-24 and 02-29-12.    Other significant comorbidities include atrophic left kidney (previously stented without improvement), atrial fibrillation and HTN. She continues xarelto. MRI 03-15-12 done for acute low back pain showed L3 compression fracture; Dr Felicity Coyer referred to IR for kyphoplasty, and I have spoken with that department today as patient has not gotten appointment.  Patient is still having low back pain, tho this is not as severe as initially; the muscle relaxant was sedating without helping discomfort and she has stopped this. She is using ~ 15 mg oxycodone daily as prn. She has intermittent tingling in feet, no neuropathy in hands.Appetite is a little better, still needs antiemetics. She has had no bleeding, no increased swelling in LE, no fever or symptoms of infection. She denies increased shortness of breath or cardiac symptoms. The lymphocele continues to drain clear fluid, a little less recently. Remainder of 10 point Review of Systems negative.  Objective:  Vital signs in last 24 hours:  BP 128/86  Pulse 67  Temp 97 F (36.1 C) (Oral)  Resp 20  Ht 5' 1.5" (1.562 m)  Wt 197 lb 11.2 oz (89.676 kg)  BMI 36.75 kg/m2  Weight is stable. Moving very carefully, but looks somewhat more comfortable today. HEENT:PERRLA, sclera clear, anicteric and oropharynx clear, no lesions Hair is thinning LymphaticsCervical, supraclavicular, and axillary nodes normal. Resp: clear to auscultation bilaterally Cardio: regular rate and rhythm GI: soft,  not more distended, a few bowel sounds.on exam done in chair. Dressing right groin with clear fluid, surrounding skin less irritated. Extremities: swelling 1+ LLE, none right Skin without rash or ecchymosis  Lab  Results:  Results for orders placed in visit on 03/21/12  CBC WITH DIFFERENTIAL      Component Value Range   WBC 5.1  3.9 - 10.3 10e3/uL   NEUT# 4.6  1.5 - 6.5 10e3/uL   HGB 11.4 (*) 11.6 - 15.9 g/dL   HCT 96.0 (*) 45.4 - 09.8 %   Platelets 209  145 - 400 10e3/uL   MCV 87.4  79.5 - 101.0 fL   MCH 29.2  25.1 - 34.0 pg   MCHC 33.4  31.5 - 36.0 g/dL   RBC 1.19  1.47 - 8.29 10e6/uL   RDW 17.1 (*) 11.2 - 14.5 %   lymph# 0.5 (*) 0.9 - 3.3 10e3/uL   MONO# 0.1  0.1 - 0.9 10e3/uL   Eosinophils Absolute 0.0  0.0 - 0.5 10e3/uL   Basophils Absolute 0.0  0.0 - 0.1 10e3/uL   NEUT% 89.6 (*) 38.4 - 76.8 %   LYMPH% 9.4 (*) 14.0 - 49.7 %   MONO% 1.0  0.0 - 14.0 %   EOS% 0.0  0.0 - 7.0 %   BASO% 0.0  0.0 - 2.0 %   nRBC 0  0 - 0 %     Studies/Results:  MRI LS 03-15-12 with mild L3 fracture as noted. We will repeat CT AP after third cycle of carbo/taxol, shortly prior to my next visit.  Medications: I have reviewed the patient's current medications. Will refill ativan for nausea.  Assessment/Plan: 1..Metastatic cervical cancer to liver, inguinal nodes and previously periaortic nodes (post RT to periaortics): continuing chemo with carboplatin and taxol, day 1 cycle 3 today. Will repeat CT AP after this cycle. 2.acute back pain from L3 compression fracture: continue prn oxycodone. Referral to IR for possible kyphoplasty in process 3.atrial fibrillation and recurrent LE DVT: on xarelto  4.IVC filter in  5.right inguinal lymphocele: Dr Derrell Lolling does not think this will heal or stop draining due to previous surgery and RT 6.atrophic left kidney      LIVESAY,LENNIS P, MD   03/21/2012, 1:51 PM

## 2012-03-21 NOTE — Patient Instructions (Signed)
[facility] Discharge Instructions for Patients Receiving Chemotherapy  Today you received the following chemotherapy agents :  Taxol,  Carboplatin.  To help prevent nausea and vomiting after your treatment, we encourage you to take your nausea medication as instructed by your physician.    If you develop nausea and vomiting that is not controlled by your nausea medication, call the clinic. If it is after clinic hours your family physician or the after hours number for the clinic or go to the Emergency Department.   BELOW ARE SYMPTOMS THAT SHOULD BE REPORTED IMMEDIATELY:  *FEVER GREATER THAN 100.5 F  *CHILLS WITH OR WITHOUT FEVER  NAUSEA AND VOMITING THAT IS NOT CONTROLLED WITH YOUR NAUSEA MEDICATION  *UNUSUAL SHORTNESS OF BREATH  *UNUSUAL BRUISING OR BLEEDING  TENDERNESS IN MOUTH AND THROAT WITH OR WITHOUT PRESENCE OF ULCERS  *URINARY PROBLEMS  *BOWEL PROBLEMS  UNUSUAL RASH Items with * indicate a potential emergency and should be followed up as soon as possible.  One of the nurses will contact you 24 hours after your treatment. Please let the nurse know about any problems that you may have experienced. Feel free to call the clinic you have any questions or concerns. The clinic phone number is [phone].   I have been informed and understand all the instructions given to me. I know to contact the clinic, my physician, or go to the Emergency Department if any problems should occur. I do not have any questions at this time, but understand that I may call the clinic during office hours   should I have any questions or need assistance in obtaining follow up care.    __________________________________________  _____________  __________ Signature of Patient or Authorized Representative            Date                   Time    __________________________________________ Nurse's Signature

## 2012-03-21 NOTE — Telephone Encounter (Signed)
Per staff message and POF I have scheduled appts.  JMW  

## 2012-03-23 ENCOUNTER — Other Ambulatory Visit: Payer: Self-pay

## 2012-03-23 DIAGNOSIS — C539 Malignant neoplasm of cervix uteri, unspecified: Secondary | ICD-10-CM

## 2012-03-23 MED ORDER — OXYCODONE-ACETAMINOPHEN 10-325 MG PO TABS: ORAL_TABLET | ORAL | Status: DC

## 2012-03-28 ENCOUNTER — Other Ambulatory Visit (HOSPITAL_BASED_OUTPATIENT_CLINIC_OR_DEPARTMENT_OTHER): Payer: 59 | Admitting: Lab

## 2012-03-28 ENCOUNTER — Ambulatory Visit (HOSPITAL_BASED_OUTPATIENT_CLINIC_OR_DEPARTMENT_OTHER): Payer: 59

## 2012-03-28 VITALS — BP 136/87 | HR 104 | Temp 97.0°F

## 2012-03-28 DIAGNOSIS — C539 Malignant neoplasm of cervix uteri, unspecified: Secondary | ICD-10-CM

## 2012-03-28 DIAGNOSIS — C787 Secondary malignant neoplasm of liver and intrahepatic bile duct: Secondary | ICD-10-CM

## 2012-03-28 DIAGNOSIS — Z5111 Encounter for antineoplastic chemotherapy: Secondary | ICD-10-CM

## 2012-03-28 LAB — CBC WITH DIFFERENTIAL/PLATELET
Basophils Absolute: 0 10*3/uL (ref 0.0–0.1)
Eosinophils Absolute: 0 10*3/uL (ref 0.0–0.5)
HGB: 10.8 g/dL — ABNORMAL LOW (ref 11.6–15.9)
MONO#: 0 10*3/uL — ABNORMAL LOW (ref 0.1–0.9)
NEUT#: 1.8 10*3/uL (ref 1.5–6.5)
RDW: 16.6 % — ABNORMAL HIGH (ref 11.2–14.5)
lymph#: 0.3 10*3/uL — ABNORMAL LOW (ref 0.9–3.3)

## 2012-03-28 LAB — COMPREHENSIVE METABOLIC PANEL (CC13)
Albumin: 3.6 g/dL (ref 3.5–5.0)
CO2: 20 mEq/L — ABNORMAL LOW (ref 22–29)
Glucose: 221 mg/dl — ABNORMAL HIGH (ref 70–99)
Potassium: 4.2 mEq/L (ref 3.5–5.1)
Sodium: 138 mEq/L (ref 136–145)
Total Protein: 7 g/dL (ref 6.4–8.3)

## 2012-03-28 MED ORDER — SODIUM CHLORIDE 0.9 % IJ SOLN
10.0000 mL | INTRAMUSCULAR | Status: DC | PRN
Start: 2012-03-28 — End: 2012-03-28
  Filled 2012-03-28: qty 10

## 2012-03-28 MED ORDER — PACLITAXEL CHEMO INJECTION 300 MG/50ML
80.0000 mg/m2 | Freq: Once | INTRAVENOUS | Status: AC
  Administered 2012-03-28: 156 mg via INTRAVENOUS
  Filled 2012-03-28: qty 26

## 2012-03-28 MED ORDER — SODIUM CHLORIDE 0.9 % IV SOLN
Freq: Once | INTRAVENOUS | Status: AC
  Administered 2012-03-28: 11:00:00 via INTRAVENOUS

## 2012-03-28 MED ORDER — DIPHENHYDRAMINE HCL 50 MG/ML IJ SOLN
50.0000 mg | Freq: Once | INTRAMUSCULAR | Status: AC
  Administered 2012-03-28: 50 mg via INTRAVENOUS

## 2012-03-28 MED ORDER — DEXAMETHASONE SODIUM PHOSPHATE 4 MG/ML IJ SOLN
20.0000 mg | Freq: Once | INTRAMUSCULAR | Status: AC
  Administered 2012-03-28: 20 mg via INTRAVENOUS

## 2012-03-28 MED ORDER — FAMOTIDINE IN NACL 20-0.9 MG/50ML-% IV SOLN
20.0000 mg | Freq: Once | INTRAVENOUS | Status: AC
  Administered 2012-03-28: 20 mg via INTRAVENOUS

## 2012-03-28 MED ORDER — ONDANSETRON 8 MG/50ML IVPB (CHCC)
8.0000 mg | Freq: Once | INTRAVENOUS | Status: AC
  Administered 2012-03-28: 8 mg via INTRAVENOUS

## 2012-03-28 MED ORDER — HEPARIN SOD (PORK) LOCK FLUSH 100 UNIT/ML IV SOLN
500.0000 [IU] | Freq: Once | INTRAVENOUS | Status: DC | PRN
  Filled 2012-03-28: qty 5

## 2012-03-28 NOTE — Patient Instructions (Addendum)
[facility] Discharge Instructions for Patients Receiving Chemotherapy  Today you received the following chemotherapy agents Taxol To help prevent nausea and vomiting after your treatment, we encourage you to take your nausea medication as prescribed.  If you develop nausea and vomiting that is not controlled by your nausea medication, call the clinic. If it is after clinic hours your family physician or the after hours number for the clinic or go to the Emergency Department. Do not drive as you have received 50 mg Benadryl IV.  BELOW ARE SYMPTOMS THAT SHOULD BE REPORTED IMMEDIATELY:  *FEVER GREATER THAN 100.5 F  *CHILLS WITH OR WITHOUT FEVER  NAUSEA AND VOMITING THAT IS NOT CONTROLLED WITH YOUR NAUSEA MEDICATION  *UNUSUAL SHORTNESS OF BREATH  *UNUSUAL BRUISING OR BLEEDING  TENDERNESS IN MOUTH AND THROAT WITH OR WITHOUT PRESENCE OF ULCERS  *URINARY PROBLEMS  *BOWEL PROBLEMS  UNUSUAL RASH Items with * indicate a potential emergency and should be followed up as soon as possible.  One of the nurses will contact you 24 hours after your treatment. Please let the nurse know about any problems that you may have experienced. Feel free to call the clinic you have any questions or concerns. The clinic phone number is [phone].   I have been informed and understand all the instructions given to me. I know to contact the clinic, my physician, or go to the Emergency Department if any problems should occur. I do not have any questions at this time, but understand that I may call the clinic during office hours   should I have any questions or need assistance in obtaining follow up care.     __________________________________________  _____________  __________ Signature of Patient or Authorized Representative            Date                   Time    __________________________________________ Nurse's Signature

## 2012-03-30 ENCOUNTER — Other Ambulatory Visit (HOSPITAL_COMMUNITY): Payer: Self-pay | Admitting: Interventional Radiology

## 2012-03-30 ENCOUNTER — Ambulatory Visit (HOSPITAL_COMMUNITY)
Admission: RE | Admit: 2012-03-30 | Discharge: 2012-03-30 | Disposition: A | Discharge status: Home / Self Care | Payer: 59 | Source: Ambulatory Visit | Attending: Internal Medicine | Admitting: Internal Medicine

## 2012-03-30 DIAGNOSIS — C569 Malignant neoplasm of unspecified ovary: Secondary | ICD-10-CM

## 2012-03-30 DIAGNOSIS — IMO0002 Reserved for concepts with insufficient information to code with codable children: Secondary | ICD-10-CM

## 2012-04-03 ENCOUNTER — Other Ambulatory Visit: Payer: Self-pay | Admitting: Radiology

## 2012-04-04 ENCOUNTER — Ambulatory Visit (HOSPITAL_COMMUNITY)
Admission: RE | Admit: 2012-04-04 | Discharge: 2012-04-04 | Disposition: A | Discharge status: Home / Self Care | Payer: 59 | Source: Ambulatory Visit | Attending: Interventional Radiology | Admitting: Interventional Radiology

## 2012-04-04 ENCOUNTER — Telehealth: Payer: Self-pay

## 2012-04-04 DIAGNOSIS — D709 Neutropenia, unspecified: Secondary | ICD-10-CM | POA: Insufficient documentation

## 2012-04-04 DIAGNOSIS — IMO0002 Reserved for concepts with insufficient information to code with codable children: Secondary | ICD-10-CM

## 2012-04-04 DIAGNOSIS — M8448XA Pathological fracture, other site, initial encounter for fracture: Secondary | ICD-10-CM | POA: Insufficient documentation

## 2012-04-04 DIAGNOSIS — Z5309 Procedure and treatment not carried out because of other contraindication: Secondary | ICD-10-CM | POA: Insufficient documentation

## 2012-04-04 DIAGNOSIS — C569 Malignant neoplasm of unspecified ovary: Secondary | ICD-10-CM

## 2012-04-04 DIAGNOSIS — C539 Malignant neoplasm of cervix uteri, unspecified: Secondary | ICD-10-CM | POA: Insufficient documentation

## 2012-04-04 LAB — CBC
HCT: 32.1 % — ABNORMAL LOW (ref 36.0–46.0)
Hemoglobin: 10.7 g/dL — ABNORMAL LOW (ref 12.0–15.0)
RBC: 3.59 MIL/uL — ABNORMAL LOW (ref 3.87–5.11)
WBC: 1.3 10*3/uL — CL (ref 4.0–10.5)

## 2012-04-04 LAB — PROTIME-INR
INR: 0.91 (ref 0.00–1.49)
Prothrombin Time: 12.2 seconds (ref 11.6–15.2)

## 2012-04-04 LAB — BASIC METABOLIC PANEL
BUN: 15 mg/dL (ref 6–23)
Chloride: 105 mEq/L (ref 96–112)
GFR calc Af Amer: 86 mL/min — ABNORMAL LOW (ref >90)
GFR calc non Af Amer: 74 mL/min — ABNORMAL LOW (ref >90)
Potassium: 4.2 mEq/L (ref 3.5–5.1)
Sodium: 139 mEq/L (ref 135–145)

## 2012-04-04 MED ORDER — CEFAZOLIN SODIUM-DEXTROSE 2-3 GM-% IV SOLR: 2.0000 g | Freq: Once | INTRAVENOUS | Status: DC

## 2012-04-04 MED ORDER — SODIUM CHLORIDE 0.9 % IV SOLN
Freq: Once | INTRAVENOUS | Status: AC
  Administered 2012-04-04: 10:00:00 via INTRAVENOUS

## 2012-04-04 MED ORDER — CEFAZOLIN SODIUM-DEXTROSE 2-3 GM-% IV SOLR
INTRAVENOUS | Status: AC
  Filled 2012-04-04: qty 50

## 2012-04-04 NOTE — Progress Notes (Signed)
Given neutropenia today - although with history of same - Dr. Darrold Span was contacted and would like to postpone vertebroplasty at this time until patient can have treatment to increase her WBC counts. Patient made aware and is in agreement with plan. Plan is for potential reschedule next week after patient has seen Dr. Darrold Span in the office.

## 2012-04-04 NOTE — Telephone Encounter (Signed)
[name] WBC today prior to vertebroplasty was low at 1.3  Told [name] that Dr. Darrold Span did not think that she needed gCSF now.  Pt. Verbalized understanding.

## 2012-04-04 NOTE — H&P (Signed)
[name] is an 62 y.o. female.   Chief Complaint: here for a vertebroplasty HPI: painful acute L3 compression fracture -worrisome for pathological fracture in patient with recurrent cervical cancer.   Past Medical History  Diagnosis Date  . Diastolic dysfunction   . MIGRAINE HEADACHE   . Atrial fibrillation     failed DCCV, CHADs2-prev pradaxa stopped due to hematuria with ureter issues/JJ   . Anemia   . HTN (hypertension)   . GLAUCOMA   . CARPAL TUNNEL SYNDROME, RIGHT   . Lymphedema     L>R leg from pelvic XRT/scarring  . Hepatic steatosis     CT scan 06/2009, 12/2009  . DVT (deep venous thrombosis) 10/2010    LLE, anticaog resumed  . CHF (congestive heart failure)     1 yr ago per pt  . Dyspnea on exertion   . Arthritis   . GERD (gastroesophageal reflux disease)   . Lesion of liver   . Small kidney     left  . Difficulty sleeping   . Radiation 10/11/11-11/19/11    5040 cGy in 28 fx's/periaortic  . Radiation 03/16/10-03/25/10    right inguinal node/left external iliac node  . Radiation 06/11/08-07/23/08    6000 cGy left pelvis  . Clotting disorder   . Hyperlipidemia   . Leg swelling   . Nausea   . Bruises easily   . Weakness   . Cervical cancer dx 04/2003, recur 04/2008    s/p debulk (TAHBSO), chemo/XRT; recurrent dz L pelvis dx 2010 and liver met 09/2010 s/p RFA  . Recurrent cervical cancer     Past Surgical History  Procedure Date  . Ureteral stent placement 2005, 01/2010, 07/2010    L hydro related to cervical ca and XRT  . Oophorectomy 2005  . Cardioversion   . Tonsillectomy   . Cholecystectomy 1984  . Total abdominal hysterectomy 2005  . Carpal tunnel 2009    right  . Ivc filter 04/2011    Family History  Problem Relation Age of Onset  . Lung cancer    . Hypertension    . Heart disease    . Stroke    . Asthma    . Asthma Son   . Cancer Mother     lung  . Stroke Father    Social History:  reports that she quit smoking about 28 years ago. She has  never used smokeless tobacco. She reports that she does not drink alcohol or use illicit drugs.  Allergies:  Allergies  Allergen Reactions  . Codeine     REACTION: Breathing issues and rash 40 years ago  . Flecainide Acetate Other (See Comments)    unknown  . Morphine     REACTION: Hallucinations  . Propafenone Hcl     LEG CRAMPS     Medication List     As of 04/04/2012 11:26 AM    ASK your doctor about these medications         dexamethasone 4 MG tablet   Commonly known as: DECADRON   Take 20 mg by mouth See admin instructions. 5 tablets weekly- 12 hours prior to chemo.    Takes for 2 weeks, then off for a week, then starts back for 2 weeks. Repeating      enoxaparin 100 MG/ML injection   Commonly known as: LOVENOX   Inject 100 mg into the skin 2 (two) times daily.      ibuprofen 200 MG tablet   Commonly known  as: ADVIL,MOTRIN   Take 400-600 mg by mouth every 6 (six) hours as needed. Pain      lidocaine-prilocaine cream   Commonly known as: EMLA   Apply topically as needed. Apply to Prota-Cath 2 hours prior to access as directed.      LORazepam 1 MG tablet   Commonly known as: ATIVAN   Take 1 tab under tongue or swallow every 4-6 hours as needed for nausea.      metoprolol 50 MG tablet   Commonly known as: LOPRESSOR   Take 1 tablet (50 mg total) by mouth 2 (two) times daily.      multivitamin with minerals Tabs   Take 1 tablet by mouth daily.      omeprazole 20 MG capsule   Commonly known as: PRILOSEC   Take 20 mg by mouth daily.      ondansetron 8 MG disintegrating tablet   Commonly known as: ZOFRAN-ODT   Take 1 tablet (8 mg total) by mouth every 8 (eight) hours as needed. nausea      oxyCODONE 20 MG 12 hr tablet   Commonly known as: OXYCONTIN   Take 20 mg by mouth 2 (two) times daily as needed. For pain      oxyCODONE-acetaminophen 10-325 MG per tablet   Commonly known as: PERCOCET   Take 1 tablet every 4 hours as needed for pain      Rivaroxaban  20 MG Tabs   Commonly known as: XARELTO   Take 1 tablet (20 mg total) by mouth daily.      sodium chloride 0.65 % nasal spray   Commonly known as: OCEAN   Place 1 spray into the nose as needed. Allergies      tiZANidine 4 MG capsule   Commonly known as: ZANAFLEX   Take 1 capsule (4 mg total) by mouth 3 (three) times daily as needed for muscle spasms.      zolpidem 5 MG tablet   Commonly known as: AMBIEN   Take 1 tablet (5 mg total) by mouth at bedtime as needed for sleep.         Imaging : *RADIOLOGY REPORT*   Clinical Data: 62 year old female with acute back pain.  Symptoms increasing times 2 weeks.  Weak hip flexor.  History of recurrent cervical cancer currently on chemotherapy.   MRI LUMBAR SPINE WITHOUT CONTRAST   Technique:  Multiplanar and multiecho pulse sequences of the lumbar spine were obtained without intravenous contrast.   Comparison: Lumbar radiographs 03/03/2012.  CT abdomen pelvis 12/31/2011.  PET-CT 09/15/2011.   Findings: Comparison radiographs show extensive surgical clips the right upper quadrant.  There are small number surgical clips the pelvis.  IVC filter is in place centered at the L3 level.  Lumbar segmentation is normal.   There is mild loss of height of the L3 vertebral body with deformity of the superior endplate and mild associated marrow edema.  Linear T1 and T2 signal subjacent to the endplate suggests a fracture.  There is trace anterolisthesis of L2 on L3.  These findings are new since November 2013.  Mild retropulsion of the posterior-superior vertebral body, but overall no significant spinal stenosis (see superimposed degenerative changes below).   There is associated T2 and STIR hyperintensity in the medial aspect of the right psoas musculature at L3 and L4.  No intramuscular fluid collection or abscess.  No definite inflammation of the left psoas.   Other visualized lumbar, sacral, and lower thoracic levels are intact. Bone  marrow signal is heterogeneous in the sacrum and visualized pelvis.  No discrete bone metastasis identified.   Atrophied left kidney and hypertrophied right kidney.  Abnormal retroperitoneal soft tissue at the level of the left renal vessels (series 7 image 6) and irregular appearance of the left ureter were present in November.  No other abnormal retroperitoneal soft tissue identified.    Visualized lower thoracic spinal cord is normal with conus medularis at L1.  Normal noncontrast appearance of the cauda equina nerve roots.   Overall mild for age lumbar degenerative changes, mostly mild facet hypertrophy in the lower lumbar spine.   L2-L3:  Mild circumferential disc osteophyte complex associated with the L3 deformity described earlier.  Mild facet and ligament flavum hypertrophy greater on the left.  No significant spinal or lateral recess stenosis.  Minimal to mild L2 foraminal stenosis. Subtle right L2 foraminal annular tear.   L3-L4:  Mild circumferential disc bulge.  Mild facet and ligament flavum hypertrophy.  Borderline to mild L3 foraminal stenosis, greater on the left.   IMPRESSION: 1.  Mild L3 compression fracture, chiefly affecting the superior endplate and with mild associated marrow edema.  Favor subacute chronicity.  Mild inflammation of the medial right psoas muscle, favor reactive. 2.  No discrete bone metastasis is identified, but marrow signal is heterogeneous in the visible sacrum and pelvis. The possibility that #1 is a pathologic fracture cannot be excluded. Given the patient's age of greater than 50 and the fracture site, the patient should be tested for osteoporosis using DXA, and the appropriate treatment considered based on the DXA results. 3.  Mild superimposed lumbar degenerative changes.  L2 and L3 mild foraminal stenosis. 4.  Abnormal left kidney and left retroperitoneal soft tissue, see staging CT report 12/31/2011.     Original Report  Authenticated By: Erskine Speed, M.D.    Results for orders placed during the hospital encounter of 04/04/12 (from the past 48 hour(s))  APTT     Status: Normal   Collection Time   04/04/12 10:06 AM      Component Value Range Comment   aPTT 32  24 - 37 seconds   BASIC METABOLIC PANEL     Status: Abnormal   Collection Time   04/04/12 10:06 AM      Component Value Range Comment   Sodium 139  135 - 145 mEq/L    Potassium 4.2  3.5 - 5.1 mEq/L    Chloride 105  96 - 112 mEq/L    CO2 23  19 - 32 mEq/L    Glucose, Bld 106 (*) 70 - 99 mg/dL    BUN 15  6 - 23 mg/dL    Creatinine, Ser 9.60  0.50 - 1.10 mg/dL    Calcium 9.8  8.4 - 45.4 mg/dL    GFR calc non Af Amer 74 (*) >90 mL/min    GFR calc Af Amer 86 (*) >90 mL/min   CBC     Status: Abnormal   Collection Time   04/04/12 10:06 AM      Component Value Range Comment   WBC 1.3 (*) 4.0 - 10.5 K/uL    RBC 3.59 (*) 3.87 - 5.11 MIL/uL    Hemoglobin 10.7 (*) 12.0 - 15.0 g/dL    HCT 09.8 (*) 11.9 - 46.0 %    MCV 89.4  78.0 - 100.0 fL    MCH 29.8  26.0 - 34.0 pg    MCHC 33.3  30.0 - 36.0 g/dL  RDW 17.4 (*) 11.5 - 15.5 %    Platelets 229  150 - 400 K/uL   PROTIME-INR     Status: Normal   Collection Time   04/04/12 10:06 AM      Component Value Range Comment   Prothrombin Time 12.2  11.6 - 15.2 seconds    INR 0.91  0.00 - 1.49     Review of Systems  Constitutional: Negative for fever and chills.  Eyes: Positive for blurred vision.       Glaucoma   Cardiovascular: Positive for leg swelling.  Gastrointestinal: Positive for heartburn.  Genitourinary: Positive for dysuria. Negative for flank pain.  Musculoskeletal: Positive for myalgias and back pain.  Neurological: Positive for weakness and headaches.  Endo/Heme/Allergies: Bruises/bleeds easily.  Psychiatric/Behavioral: The patient is nervous/anxious and has insomnia.     Blood pressure 108/72, pulse 102, temperature 97.1 F (36.2 C), temperature source Oral, resp. rate 20, height 5' 1.5"  (1.562 m), weight 192 lb (87.091 kg), SpO2 99.00%. Physical Exam  Constitutional: She is oriented to person, place, and time. She appears well-developed and well-nourished. No distress.  HENT:  Head: Normocephalic and atraumatic.  Eyes: Pupils are equal, round, and reactive to light.  Cardiovascular: Exam reveals no gallop and no friction rub.   No murmur heard.      IR, IR  Respiratory: Effort normal and breath sounds normal. No respiratory distress. She has no wheezes. She has no rales.  GI: Soft. Bowel sounds are normal. She exhibits no distension. There is no tenderness.  Musculoskeletal: Normal range of motion. She exhibits edema and tenderness.       Tender to palpation along lower spine c/w L3 area   Neurological: She is alert and oriented to person, place, and time.  Skin: Skin is warm and dry.  Psychiatric: She has a normal mood and affect. Her behavior is normal. Judgment and thought content normal.     Assessment/Plan Patient with acute symptomatic L3 fracture amendable to Vp/KP who was seen in consultation regarding procedure details, benefits and potential complications. Labs reviewed and are appropriate to proceed. Written consent obtained. Plan for L3 VP/KP with needle biopsy.     Tyrail Grandfield D 04/04/2012, 11:26 AM

## 2012-04-06 ENCOUNTER — Other Ambulatory Visit: Payer: Self-pay | Admitting: Radiology

## 2012-04-07 ENCOUNTER — Other Ambulatory Visit (HOSPITAL_COMMUNITY): Payer: 59

## 2012-04-07 ENCOUNTER — Ambulatory Visit (HOSPITAL_COMMUNITY)
Admission: RE | Admit: 2012-04-07 | Discharge: 2012-04-07 | Disposition: A | Discharge status: Home / Self Care | Payer: 59 | Source: Ambulatory Visit | Attending: Oncology | Admitting: Oncology

## 2012-04-07 DIAGNOSIS — N269 Renal sclerosis, unspecified: Secondary | ICD-10-CM | POA: Insufficient documentation

## 2012-04-07 DIAGNOSIS — N289 Disorder of kidney and ureter, unspecified: Secondary | ICD-10-CM | POA: Insufficient documentation

## 2012-04-07 DIAGNOSIS — C772 Secondary and unspecified malignant neoplasm of intra-abdominal lymph nodes: Secondary | ICD-10-CM | POA: Insufficient documentation

## 2012-04-07 DIAGNOSIS — C539 Malignant neoplasm of cervix uteri, unspecified: Secondary | ICD-10-CM | POA: Insufficient documentation

## 2012-04-07 DIAGNOSIS — Z9089 Acquired absence of other organs: Secondary | ICD-10-CM | POA: Insufficient documentation

## 2012-04-07 DIAGNOSIS — K7689 Other specified diseases of liver: Secondary | ICD-10-CM | POA: Insufficient documentation

## 2012-04-07 MED ORDER — IOHEXOL 300 MG/ML  SOLN
100.0000 mL | Freq: Once | INTRAMUSCULAR | Status: AC | PRN
  Administered 2012-04-07: 100 mL via INTRAVENOUS

## 2012-04-09 ENCOUNTER — Other Ambulatory Visit: Payer: Self-pay | Admitting: Oncology

## 2012-04-10 ENCOUNTER — Encounter (HOSPITAL_COMMUNITY): Payer: Self-pay

## 2012-04-10 ENCOUNTER — Ambulatory Visit (HOSPITAL_COMMUNITY)
Admission: RE | Admit: 2012-04-10 | Discharge: 2012-04-10 | Disposition: A | Discharge status: Home / Self Care | Payer: 59 | Source: Ambulatory Visit | Attending: Interventional Radiology | Admitting: Interventional Radiology

## 2012-04-10 ENCOUNTER — Telehealth: Payer: Self-pay

## 2012-04-10 DIAGNOSIS — M8448XA Pathological fracture, other site, initial encounter for fracture: Secondary | ICD-10-CM | POA: Insufficient documentation

## 2012-04-10 LAB — CBC WITH DIFFERENTIAL/PLATELET
Basophils Relative: 1 % (ref 0–1)
HCT: 32.8 % — ABNORMAL LOW (ref 36.0–46.0)
Hemoglobin: 11.1 g/dL — ABNORMAL LOW (ref 12.0–15.0)
Lymphocytes Relative: 17 % (ref 12–46)
Lymphs Abs: 0.4 10*3/uL — ABNORMAL LOW (ref 0.7–4.0)
MCHC: 33.8 g/dL (ref 30.0–36.0)
Monocytes Absolute: 0.6 10*3/uL (ref 0.1–1.0)
Monocytes Relative: 23 % — ABNORMAL HIGH (ref 3–12)
Neutro Abs: 1.5 10*3/uL — ABNORMAL LOW (ref 1.7–7.7)
Neutrophils Relative %: 59 % (ref 43–77)
RBC: 3.67 MIL/uL — ABNORMAL LOW (ref 3.87–5.11)
WBC: 2.5 10*3/uL — ABNORMAL LOW (ref 4.0–10.5)

## 2012-04-10 MED ORDER — FENTANYL CITRATE 0.05 MG/ML IJ SOLN
INTRAMUSCULAR | Status: DC | PRN
  Administered 2012-04-10 (×2): 25 ug via INTRAVENOUS

## 2012-04-10 MED ORDER — TOBRAMYCIN SULFATE 1.2 G IJ SOLR
INTRAMUSCULAR | Status: AC
  Filled 2012-04-10: qty 1.2

## 2012-04-10 MED ORDER — SODIUM CHLORIDE 0.9 % IV SOLN
INTRAVENOUS | Status: DC
  Administered 2012-04-10: 12:00:00 via INTRAVENOUS

## 2012-04-10 MED ORDER — FENTANYL CITRATE 0.05 MG/ML IJ SOLN
INTRAMUSCULAR | Status: AC
  Filled 2012-04-10: qty 4

## 2012-04-10 MED ORDER — MIDAZOLAM HCL 2 MG/2ML IJ SOLN
INTRAMUSCULAR | Status: AC
  Filled 2012-04-10: qty 4

## 2012-04-10 MED ORDER — HYDROMORPHONE HCL PF 1 MG/ML IJ SOLN
INTRAMUSCULAR | Status: AC
  Filled 2012-04-10: qty 2

## 2012-04-10 MED ORDER — HYDROMORPHONE HCL PF 1 MG/ML IJ SOLN
INTRAMUSCULAR | Status: AC
  Filled 2012-04-10: qty 1

## 2012-04-10 MED ORDER — HYDROMORPHONE HCL PF 1 MG/ML IJ SOLN
INTRAMUSCULAR | Status: DC | PRN
  Administered 2012-04-10: 1 mg

## 2012-04-10 MED ORDER — IOHEXOL 300 MG/ML  SOLN
50.0000 mL | Freq: Once | INTRAMUSCULAR | Status: AC | PRN
  Administered 2012-04-10: 3 mL via INTRAVENOUS

## 2012-04-10 MED ORDER — CEFAZOLIN SODIUM-DEXTROSE 2-3 GM-% IV SOLR
INTRAVENOUS | Status: AC
  Administered 2012-04-10: 2 g via INTRAVENOUS
  Filled 2012-04-10: qty 50

## 2012-04-10 MED ORDER — MIDAZOLAM HCL 2 MG/2ML IJ SOLN
INTRAMUSCULAR | Status: DC | PRN
  Administered 2012-04-10 (×3): 1 mg via INTRAVENOUS

## 2012-04-10 MED ORDER — CEFAZOLIN SODIUM-DEXTROSE 2-3 GM-% IV SOLR
2.0000 g | Freq: Once | INTRAVENOUS | Status: AC
  Administered 2012-04-10: 2 g via INTRAVENOUS

## 2012-04-10 NOTE — Progress Notes (Signed)
Pt returns for possible re-attempt at L3 compression fx augmentation. She had significant neutropenia last week and procedure was postponed. She returns today for procedure. CBC drawn, pending. She states she did not receive any treatment since last week and her next chemo tx is planned for tomorrow. She feels well otherwise, no recent illness or fevers.  If CBC ok, will go forward with procedure today.  ENT: throat cleat airway. Lungs: CTA without w/r/r Heart: Regular  No other changes to H&P from 2/4 Meryl Ponder, [facility] 04/10/2012 11:50 AM

## 2012-04-10 NOTE — ED Notes (Signed)
Fluid bolus complete  

## 2012-04-10 NOTE — ED Notes (Signed)
200cc NS fluid bolus infusing per DR. D for low BP

## 2012-04-10 NOTE — Telephone Encounter (Signed)
Message copied by Lorine Bears on Mon Apr 10, 2012 11:03 AM ------      Message from: Reece Packer      Created: Sun Apr 09, 2012  9:17 PM       Please let her know that we will discuss what to do with chemo at visit this week, but will not treat her on 2-11 as previously planned. I just want her to know not to expect chemo that day. ------

## 2012-04-10 NOTE — Telephone Encounter (Signed)
Left a message for Ms. Coronado on home vm as noted below by Dr. Darrold Span.

## 2012-04-11 ENCOUNTER — Other Ambulatory Visit (HOSPITAL_BASED_OUTPATIENT_CLINIC_OR_DEPARTMENT_OTHER): Payer: 59 | Admitting: Lab

## 2012-04-11 ENCOUNTER — Ambulatory Visit (HOSPITAL_BASED_OUTPATIENT_CLINIC_OR_DEPARTMENT_OTHER): Payer: 59 | Admitting: Oncology

## 2012-04-11 ENCOUNTER — Telehealth (HOSPITAL_COMMUNITY): Payer: Self-pay | Admitting: *Deleted

## 2012-04-11 ENCOUNTER — Ambulatory Visit: Payer: 59

## 2012-04-11 ENCOUNTER — Telehealth: Payer: Self-pay | Admitting: Oncology

## 2012-04-11 VITALS — BP 121/75 | HR 102 | Temp 97.2°F | Resp 20 | Ht 61.5 in | Wt 198.8 lb

## 2012-04-11 DIAGNOSIS — C539 Malignant neoplasm of cervix uteri, unspecified: Secondary | ICD-10-CM

## 2012-04-11 DIAGNOSIS — I82409 Acute embolism and thrombosis of unspecified deep veins of unspecified lower extremity: Secondary | ICD-10-CM

## 2012-04-11 DIAGNOSIS — C787 Secondary malignant neoplasm of liver and intrahepatic bile duct: Secondary | ICD-10-CM

## 2012-04-11 DIAGNOSIS — I4891 Unspecified atrial fibrillation: Secondary | ICD-10-CM

## 2012-04-11 LAB — CBC WITH DIFFERENTIAL/PLATELET
EOS%: 0.9 % (ref 0.0–7.0)
Eosinophils Absolute: 0 10*3/uL (ref 0.0–0.5)
HGB: 10.6 g/dL — ABNORMAL LOW (ref 11.6–15.9)
MCV: 90.5 fL (ref 79.5–101.0)
MONO%: 27.8 % — ABNORMAL HIGH (ref 0.0–14.0)
NEUT#: 1.1 10*3/uL — ABNORMAL LOW (ref 1.5–6.5)
RBC: 3.57 10*6/uL — ABNORMAL LOW (ref 3.70–5.45)
RDW: 17.9 % — ABNORMAL HIGH (ref 11.2–14.5)
lymph#: 0.4 10*3/uL — ABNORMAL LOW (ref 0.9–3.3)
nRBC: 0 % (ref 0–0)

## 2012-04-11 NOTE — Telephone Encounter (Signed)
Gave pt appt for [facility] 2014 lab and MD °

## 2012-04-11 NOTE — Patient Instructions (Signed)
Appointments as scheduled. 

## 2012-04-12 ENCOUNTER — Other Ambulatory Visit: Payer: Self-pay | Admitting: *Deleted

## 2012-04-12 ENCOUNTER — Other Ambulatory Visit (HOSPITAL_COMMUNITY): Payer: Self-pay | Admitting: Interventional Radiology

## 2012-04-12 DIAGNOSIS — IMO0002 Reserved for concepts with insufficient information to code with codable children: Secondary | ICD-10-CM

## 2012-04-12 DIAGNOSIS — C539 Malignant neoplasm of cervix uteri, unspecified: Secondary | ICD-10-CM

## 2012-04-12 MED ORDER — METOPROLOL TARTRATE 50 MG PO TABS: 50.0000 mg | ORAL_TABLET | Freq: Two times a day (BID) | ORAL | Status: DC

## 2012-04-12 MED ORDER — OXYCODONE-ACETAMINOPHEN 10-325 MG PO TABS: ORAL_TABLET | ORAL | Status: DC

## 2012-04-13 ENCOUNTER — Encounter: Payer: Self-pay | Admitting: Oncology

## 2012-04-13 NOTE — Progress Notes (Signed)
OFFICE PROGRESS NOTE   04-11-2012   Physicians: J.Kinard, V.Leschber, D.ClarkePearson, J.Allred, D.Margarita Grizzle, H.Derrell Lolling   INTERVAL HISTORY:   Patient is seen, together with friend, in continuing attention to her metastatic cervical carcinoma, now having had 3 cycles of  taxol/ carboplatin from 02-01-12 thru 03-28-12, doses reduced because of cytopenias; she did tolerate carboplatin from renal standpoint. Restaging CT AP 04-07-12 unfortunately shows increase in size of most of the liver mets, tho no new disease elsewhere. Recent course has been complicated by right inguinal lymphocele and benign L3 compression fracture, post kyphoplasty 04-11-12. The kyphoplasty was delayed from last week due to low WBC. She has PAC, flushed 03-28-12.  History is of radical hysterectomy, bilateral salpingo-oophorectomy and pelvic lymphadenectomy in March of 2005 by Dr Ronita Hipps for a stage IB cervical cancer. Final pathology showed no residual disease and all lymph nodes were negative. The patient was followed for 5 years with no evidence of disease until she developed a recurrence of the right pelvic sidewall in February of 2010. This caused obstruction of the right ureter. She was treated with radiation therapy and concurrent cisplatin chemotherapy. In late 2011 she had recurrence in right inguinal lymph node and left pelvic nodes and received additional radiation therapy to those areas, completed in January 2012. In August 2012 she had a 2.8 cm liver lesion and received 6 cycles of Taxol + one cycle of carboplatin. She had a partial response to chemo, then increase in the solitary liver lesion and RFA was performed on 10/07/2011. 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 After ~ 3 months she was changed to xarelto by Dr Felicity Coyer because of local bruising and discomfort from the  lovenox injections; she continues xarelto now. She had progression in left periaortic nodes which was symptomatic with pain and treated by Dr Roselind Messier with 5040 cGY in 28 fractions from 10-11-11 thru 11-19-11. Pain has improved since radiation and she has been able to decrease pain medication to present oycontin 20 mg q 12 hrs with prn percocet. CT CAP repeated 12-31-11 found multiple new liver lesions, small left inguinal nodes and a 5 cm fluid collection right groin. Chemoth erapy was resumed with low dose carboplatin and taxol on 02-01-12, three cycles thru 03-28-12 as noted above.  Patient has some less back pain since kyphoplasty yesterday, tho riding in car has been difficult. She has had no bleeding or fever. She has almost no drainage now from the right inguinal lymphocele. She notices more swelling LLE, could not manage compression stockings previously and cannot reach leg to do lymphedema massage, situation complicated by chronic torn muscle in left thigh medially from accident with horse. No other pain. No nausea.  Remainder of 10 point Review of Systems negative.  We have discussed findings of CT as above. I have told her that additional chemotherapy has not been shown to prolong survival, tho there are other agents that may give some response. I have mentioned Alimta, but have also told her that I will discuss with Dr Yolande Jolly. Alternative would be no further treatment, with supportive care only. We have decided to consider the options over next couple of weeks as she recovers from the kyphoplasty and hopefully improves counts further.  Objective:  Vital signs in last 24 hours:  BP 121/75  Pulse 102  Temp(Src) 97.2 F (36.2 C) (Oral)  Resp  20  Ht 5' 1.5" (1.562 m)  Wt 198 lb 12.8 oz (90.175 kg)  BMI 36.96 kg/m2 Alert, looks somewhat uncomfortable in WC and tearful re CT information   HEENT:PERRLA, sclera clear, anicteric and oropharynx clear, no lesions. No alopecia. Lymphatics: no  supraclavicular adenopathy Resp: clear to auscultation bilaterally Cardio: regular rate and rhythm GI: soft, nontender Extremities: no edema RLE, 1-2+ LLE to thigh  PAC flushed 03-28-12  Lab Results:  Results for orders placed in visit on 04/11/12  CBC WITH DIFFERENTIAL      Result Value Range   WBC 2.2 (*) 3.9 - 10.3 10e3/uL   NEUT# 1.1 (*) 1.5 - 6.5 10e3/uL   HGB 10.6 (*) 11.6 - 15.9 g/dL   HCT 16.1 (*) 09.6 - 04.5 %   Platelets 183  145 - 400 10e3/uL   MCV 90.5  79.5 - 101.0 fL   MCH 29.7  25.1 - 34.0 pg   MCHC 32.8  31.5 - 36.0 g/dL   RBC 4.09 (*) 8.11 - 9.14 10e6/uL   RDW 17.9 (*) 11.2 - 14.5 %   lymph# 0.4 (*) 0.9 - 3.3 10e3/uL   MONO# 0.6  0.1 - 0.9 10e3/uL   Eosinophils Absolute 0.0  0.0 - 0.5 10e3/uL   Basophils Absolute 0.0  0.0 - 0.1 10e3/uL   NEUT% 50.0  38.4 - 76.8 %   LYMPH% 19.9  14.0 - 49.7 %   MONO% 27.8 (*) 0.0 - 14.0 %   EOS% 0.9  0.0 - 7.0 %   BASO% 1.4  0.0 - 2.0 %   nRBC 0  0 - 0 %     Studies/Results: CT ABDOMEN AND PELVIS WITH CONTRAST 04-07-12 Technique: Multidetector CT imaging of the abdomen and pelvis was  performed following the standard protocol during bolus  administration of intravenous contrast.  Contrast: OMNIPAQUE IOHEXOL 300 MG/ML SOLN  Comparison: 12/31/2011  Findings:  The lung bases appear clear. No pericardial or pleural effusion  identified.  There are multifocal low attenuation lesions within the liver which  is suspicious for metastatic disease. Index lesion within the  right hepatic lobe measures 2.6 cm, image 18. Previously 3.4 cm.  Index lesion along the falciform ligament measures 3.3 cm, image  25. Previously 1.9 cm. Posterior right hepatic lobe lesion  measures 3.3 cm, image 27/series 2. Previously 3 cm. Lesion along  the dome of the liver measures 2.4 cm, image 13. Previously 1.4  cm. Previous cholecystectomy. No biliary dilatation. The  pancreas is unremarkable. Normal appearance of the spleen.  The adrenal  glands are both normal. There is an 11 mm interpolar  hypodensity within the right kidney, image 32. This is unchanged  from previous exam. Periaortic adenopathy is again noted. Mass  involving the left kidney measures 2 x 3.5 cm, image 28/series 4.  Previously this measured 2.8 x 3.9 cm. There has been progressive  atrophy of the left kidney. Fluid attenuating lesion along the  distal left external iliac lymph node chain measures 2.9 x 3.1 cm,  image 72/series 2. Previously this measured 2.3 x 1.7 cm. Large  right inguinal fluid collection has resolved.  Urinary bladder appears collapsed. Set stable surgical changes in  the pelvis.  There is a filter within the IVC. Calcified atherosclerotic  changes involve the abdominal aorta and its branches.  The stomach and the small bowel loops appear within normal limits.  The appendix is visualized and appears normal. Normal appearance  of the colon.  There is  no abnormal aggressive lytic or sclerotic bone lesions  identified.  IMPRESSION:  1. Multifocal low attenuation lesions within the liver parenchyma  consistent with metastatic disease. Most of these lesions have  increased in size from previous exam.  2. 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 There has been progressive  atrophy of the left kidney.  3. Resolution of fluid attenuating mass within the right inguinal  region. There is a new small fluid attenuating structure within  the distal left external iliac lymph node chain which may represent  a lymphocele or seroma. Necrotic lymph node is also a diagnostic  consideration.  PATHOLOGY From kyphoplasty benign  Medications: I have reviewed the patient's current medications.  Assessment/Plan:  1.Metastatic cervical cancer to liver, inguinal and periaortic nodes: progression in majority of liver mets despite taxol/carbo x 3 cycles. Treatment decision pending. I will see  her back at least in 4 weeks when she is due PAC flush. I will let Dr Yolande Jolly know situation as above. 3.atrial fibrillation and recurrent LE DVT: on xarelto  4.IVC filter in  5.right inguinal lymphocele: much less drainage, and improved on CT 6.atrophic left kidney 7.LLE swelling due to surgery and pelvic RT and previous DVT. Unfortunately no good solution to this problem now.  Patient and friend appreciated discussion and know that they can call prior to scheduled appointment if needed. >50% of this visit was spent in discussion. Reece Packer, MD

## 2012-04-18 ENCOUNTER — Other Ambulatory Visit: Payer: 59 | Admitting: Lab

## 2012-04-18 ENCOUNTER — Other Ambulatory Visit (HOSPITAL_COMMUNITY): Payer: Self-pay | Admitting: Interventional Radiology

## 2012-04-18 ENCOUNTER — Ambulatory Visit (HOSPITAL_COMMUNITY)
Admission: RE | Admit: 2012-04-18 | Discharge: 2012-04-18 | Disposition: A | Discharge status: Home / Self Care | Payer: 59 | Source: Ambulatory Visit | Attending: Interventional Radiology | Admitting: Interventional Radiology

## 2012-04-18 DIAGNOSIS — C539 Malignant neoplasm of cervix uteri, unspecified: Secondary | ICD-10-CM

## 2012-04-18 DIAGNOSIS — M549 Dorsalgia, unspecified: Secondary | ICD-10-CM

## 2012-04-19 ENCOUNTER — Ambulatory Visit
Admission: RE | Admit: 2012-04-19 | Discharge: 2012-04-19 | Disposition: A | Discharge status: Home / Self Care | Payer: 59 | Source: Ambulatory Visit | Attending: Oncology | Admitting: Oncology

## 2012-04-19 ENCOUNTER — Other Ambulatory Visit: Payer: Self-pay | Admitting: Oncology

## 2012-04-19 DIAGNOSIS — C539 Malignant neoplasm of cervix uteri, unspecified: Secondary | ICD-10-CM

## 2012-04-19 DIAGNOSIS — M549 Dorsalgia, unspecified: Secondary | ICD-10-CM

## 2012-04-20 ENCOUNTER — Other Ambulatory Visit (HOSPITAL_COMMUNITY): Payer: Self-pay | Admitting: Interventional Radiology

## 2012-04-20 ENCOUNTER — Encounter: Payer: Self-pay | Admitting: Internal Medicine

## 2012-04-20 ENCOUNTER — Other Ambulatory Visit: Payer: Self-pay | Admitting: Oncology

## 2012-04-20 DIAGNOSIS — IMO0002 Reserved for concepts with insufficient information to code with codable children: Secondary | ICD-10-CM

## 2012-04-21 ENCOUNTER — Other Ambulatory Visit: Payer: Self-pay | Admitting: Radiology

## 2012-04-21 ENCOUNTER — Telehealth: Payer: Self-pay | Admitting: Internal Medicine

## 2012-04-21 ENCOUNTER — Encounter (HOSPITAL_COMMUNITY): Payer: Self-pay | Admitting: Pharmacy Technician

## 2012-04-21 ENCOUNTER — Other Ambulatory Visit: Payer: Self-pay | Admitting: *Deleted

## 2012-04-21 MED ORDER — TIZANIDINE HCL 4 MG PO CAPS: 4.0000 mg | ORAL_CAPSULE | Freq: Three times a day (TID) | ORAL | Status: DC | PRN

## 2012-04-21 MED ORDER — ENOXAPARIN SODIUM 150 MG/ML ~~LOC~~ SOLN: 150.0000 mg | Freq: Every day | SUBCUTANEOUS | Status: DC

## 2012-04-21 NOTE — Telephone Encounter (Signed)
Spoke with patient in regards to Xarelto and Lovenox pre and post Kyphoplasty on 2/24.   2/21 - Take Xarelto 2/22- Hold Xarelto and take Lovenox in the AM 2/23 - Hold Xarelto and take Lovenox in the AM 2/24 - Do not take Xarelto and Do not take Lovenox 2/25 - Take Xarelto - No Lovenox

## 2012-04-21 NOTE — Telephone Encounter (Signed)
Call from Beckey Downing, PA in interventional radiology Planning second kyphoplasty procedure for Monday Will hold Xarelto because of same for next 3 days (Saturday, Sunday, and Monday) Because of history of DVT and hypercoagulable states when off anticoagulation, I recommend low molecular weight heparin bridge We'll ask our Coumadin clinic when necessary to coordinate same -Arline Asp will contact patient regarding dosing and instructions Thanks

## 2012-04-24 ENCOUNTER — Encounter (HOSPITAL_COMMUNITY): Payer: Self-pay

## 2012-04-24 ENCOUNTER — Ambulatory Visit (HOSPITAL_COMMUNITY)
Admission: RE | Admit: 2012-04-24 | Discharge: 2012-04-24 | Disposition: A | Discharge status: Home / Self Care | Payer: 59 | Source: Ambulatory Visit | Attending: Interventional Radiology | Admitting: Interventional Radiology

## 2012-04-24 ENCOUNTER — Ambulatory Visit (HOSPITAL_COMMUNITY): Payer: 59

## 2012-04-24 VITALS — BP 116/73 | HR 100 | Temp 97.8°F | Resp 18

## 2012-04-24 DIAGNOSIS — M8448XA Pathological fracture, other site, initial encounter for fracture: Secondary | ICD-10-CM | POA: Insufficient documentation

## 2012-04-24 DIAGNOSIS — IMO0002 Reserved for concepts with insufficient information to code with codable children: Secondary | ICD-10-CM

## 2012-04-24 DIAGNOSIS — Z8541 Personal history of malignant neoplasm of cervix uteri: Secondary | ICD-10-CM | POA: Insufficient documentation

## 2012-04-24 DIAGNOSIS — I10 Essential (primary) hypertension: Secondary | ICD-10-CM | POA: Insufficient documentation

## 2012-04-24 DIAGNOSIS — I509 Heart failure, unspecified: Secondary | ICD-10-CM | POA: Insufficient documentation

## 2012-04-24 LAB — CBC WITH DIFFERENTIAL/PLATELET
Basophils Absolute: 0 10*3/uL (ref 0.0–0.1)
Basophils Relative: 0 % (ref 0–1)
Hemoglobin: 10.6 g/dL — ABNORMAL LOW (ref 12.0–15.0)
Lymphocytes Relative: 13 % (ref 12–46)
MCHC: 34.2 g/dL (ref 30.0–36.0)
Neutro Abs: 2.8 10*3/uL (ref 1.7–7.7)
Neutrophils Relative %: 73 % (ref 43–77)
RDW: 16.2 % — ABNORMAL HIGH (ref 11.5–15.5)
WBC: 3.8 10*3/uL — ABNORMAL LOW (ref 4.0–10.5)

## 2012-04-24 LAB — COMPREHENSIVE METABOLIC PANEL
ALT: 32 U/L (ref 0–35)
AST: 19 U/L (ref 0–37)
Albumin: 3.5 g/dL (ref 3.5–5.2)
Alkaline Phosphatase: 208 U/L — ABNORMAL HIGH (ref 39–117)
CO2: 27 mEq/L (ref 19–32)
Chloride: 103 mEq/L (ref 96–112)
Potassium: 4.3 mEq/L (ref 3.5–5.1)
Total Bilirubin: 0.4 mg/dL (ref 0.3–1.2)

## 2012-04-24 LAB — APTT: aPTT: 29 seconds (ref 24–37)

## 2012-04-24 MED ORDER — IOHEXOL 300 MG/ML  SOLN
50.0000 mL | Freq: Once | INTRAMUSCULAR | Status: AC | PRN
  Administered 2012-04-24: 1 mL

## 2012-04-24 MED ORDER — SODIUM CHLORIDE 0.9 % IV SOLN: INTRAVENOUS | Status: AC

## 2012-04-24 MED ORDER — OXYCODONE-ACETAMINOPHEN 5-325 MG PO TABS: 1.0000 | ORAL_TABLET | ORAL | Status: DC | PRN

## 2012-04-24 MED ORDER — FENTANYL CITRATE 0.05 MG/ML IJ SOLN
INTRAMUSCULAR | Status: DC | PRN
  Administered 2012-04-24 (×6): 25 ug via INTRAVENOUS

## 2012-04-24 MED ORDER — MIDAZOLAM HCL 2 MG/2ML IJ SOLN
INTRAMUSCULAR | Status: DC | PRN
  Administered 2012-04-24 (×6): 1 mg via INTRAVENOUS

## 2012-04-24 MED ORDER — CEFAZOLIN SODIUM-DEXTROSE 2-3 GM-% IV SOLR
2.0000 g | Freq: Once | INTRAVENOUS | Status: AC
  Administered 2012-04-24: 2 g via INTRAVENOUS
  Filled 2012-04-24: qty 50

## 2012-04-24 MED ORDER — OXYCODONE HCL ER 20 MG PO T12A: 20.0000 mg | EXTENDED_RELEASE_TABLET | Freq: Two times a day (BID) | ORAL | Status: DC

## 2012-04-24 MED ORDER — MIDAZOLAM HCL 2 MG/2ML IJ SOLN
INTRAMUSCULAR | Status: AC
  Filled 2012-04-24: qty 6

## 2012-04-24 MED ORDER — OXYCODONE-ACETAMINOPHEN 5-325 MG PO TABS
ORAL_TABLET | ORAL | Status: AC
  Filled 2012-04-24: qty 1

## 2012-04-24 MED ORDER — HYDROMORPHONE HCL PF 1 MG/ML IJ SOLN
INTRAMUSCULAR | Status: AC
  Filled 2012-04-24: qty 2

## 2012-04-24 MED ORDER — OXYCODONE-ACETAMINOPHEN 5-325 MG PO TABS
0.5000 | ORAL_TABLET | ORAL | Status: DC | PRN
  Administered 2012-04-24: 0.5 via ORAL

## 2012-04-24 MED ORDER — FENTANYL CITRATE 0.05 MG/ML IJ SOLN
INTRAMUSCULAR | Status: AC
  Filled 2012-04-24: qty 6

## 2012-04-24 MED ORDER — TOBRAMYCIN SULFATE 1.2 G IJ SOLR
INTRAMUSCULAR | Status: AC
  Filled 2012-04-24: qty 1.2

## 2012-04-24 MED ORDER — SODIUM CHLORIDE 0.9 % IV SOLN
Freq: Once | INTRAVENOUS | Status: AC
  Administered 2012-04-24: 12:00:00 via INTRAVENOUS

## 2012-04-24 MED ORDER — HYDROMORPHONE HCL PF 1 MG/ML IJ SOLN
INTRAMUSCULAR | Status: DC | PRN
  Administered 2012-04-24: 1 mg

## 2012-04-24 NOTE — H&P (Signed)
[name] is an 62 y.o. female.   Chief Complaint: horrible back pain Known pt of Dr Corliss Skains Had Lumbar #3 KP 04/11/2012 Now with new onset pain and MRI revealing new fx at Lumbar #1 Pt scheduled for L1 kyphoplasty HPI: Cervical ca; afib; migraines; HTN; glaucoma; lymphedema; Hepatic steatosis; HLD  Past Medical History  Diagnosis Date  . Diastolic dysfunction   . MIGRAINE HEADACHE   . Atrial fibrillation     failed DCCV, CHADs2-prev pradaxa stopped due to hematuria with ureter issues/JJ   . Anemia   . HTN (hypertension)   . GLAUCOMA   . CARPAL TUNNEL SYNDROME, RIGHT   . Lymphedema     L>R leg from pelvic XRT/scarring  . Hepatic steatosis     CT scan 06/2009, 12/2009  . DVT (deep venous thrombosis) 10/2010    LLE, anticaog resumed  . CHF (congestive heart failure)     1 yr ago per pt  . Dyspnea on exertion   . Arthritis   . GERD (gastroesophageal reflux disease)   . Lesion of liver   . Small kidney     left  . Difficulty sleeping   . Radiation 10/11/11-11/19/11    5040 cGy in 28 fx's/periaortic  . Radiation 03/16/10-03/25/10    right inguinal node/left external iliac node  . Radiation 06/11/08-07/23/08    6000 cGy left pelvis  . Clotting disorder   . Hyperlipidemia   . Leg swelling   . Nausea   . Bruises easily   . Weakness   . Cervical cancer dx 04/2003, recur 04/2008    s/p debulk (TAHBSO), chemo/XRT; recurrent dz L pelvis dx 2010 and liver met 09/2010 s/p RFA  . Recurrent cervical cancer     Past Surgical History  Procedure Laterality Date  . Ureteral stent placement  2005, 01/2010, 07/2010    L hydro related to cervical ca and XRT  . Oophorectomy  2005  . Cardioversion    . Tonsillectomy    . Cholecystectomy  1984  . Total abdominal hysterectomy  2005  . Carpal tunnel  2009    right  . Ivc filter  04/2011    Family History  Problem Relation Age of Onset  . Lung cancer    . Hypertension    . Heart disease    . Stroke    . Asthma    . Asthma Son   .  Cancer Mother     lung  . Stroke Father    Social History:  reports that she quit smoking about 28 years ago. She has never used smokeless tobacco. She reports that she does not drink alcohol or use illicit drugs.  Allergies:  Allergies  Allergen Reactions  . Flecainide Acetate Other (See Comments)    Doesn't remember reaction  . Morphine Other (See Comments)     Hallucinations  . Propafenone Hcl Other (See Comments)    Leg cramps  . Codeine Rash and Other (See Comments)    Breathing issues     (Not in a hospital admission)  No results found for this or any previous visit (from the past 48 hour(s)). No results found.  Review of Systems  Constitutional: Negative for fever and chills.  Respiratory: Negative for cough.   Gastrointestinal: Negative for nausea and vomiting.  Musculoskeletal: Positive for back pain.  Neurological: Positive for weakness. Negative for headaches.    There were no vitals taken for this visit. Physical Exam  Constitutional: She is oriented to person,  place, and time. She appears well-developed and well-nourished.  Cardiovascular: Normal rate.   No murmur heard. irregular  Respiratory: Effort normal and breath sounds normal. She has no wheezes.  GI: Soft. Bowel sounds are normal. There is no tenderness.  Musculoskeletal: Normal range of motion.  Uses walker  Neurological: She is alert and oriented to person, place, and time.  Skin: Skin is warm and dry.  Psychiatric: She has a normal mood and affect. Her behavior is normal. Judgment and thought content normal.     Assessment/Plan KP L3 04/11/2012 New pain: MRI shows new fx L1 Now scheduled for L1 KP Aware of procedure benefits and risks and agreeable to proceed Consent signed and in chart  [name] A 04/24/2012, 11:38 AM

## 2012-04-24 NOTE — Procedures (Signed)
S/P L1 balloon KP 

## 2012-04-25 ENCOUNTER — Other Ambulatory Visit: Payer: 59 | Admitting: Lab

## 2012-04-25 ENCOUNTER — Telehealth: Payer: Self-pay

## 2012-04-25 NOTE — Telephone Encounter (Signed)
Ok to fill - ?oxycontin or percocet or both?

## 2012-04-25 NOTE — Telephone Encounter (Signed)
Pt called stating that she has back surgery with Dr Corliss Skains but he advised her that one of her other MD would have to manage her pain medication. Pt is requesting VAL manage this, please advise.

## 2012-04-26 ENCOUNTER — Encounter: Payer: Self-pay | Admitting: Internal Medicine

## 2012-04-26 ENCOUNTER — Ambulatory Visit (INDEPENDENT_AMBULATORY_CARE_PROVIDER_SITE_OTHER): Payer: 59 | Admitting: Internal Medicine

## 2012-04-26 VITALS — BP 100/62 | HR 98 | Temp 97.0°F

## 2012-04-26 DIAGNOSIS — IMO0002 Reserved for concepts with insufficient information to code with codable children: Secondary | ICD-10-CM

## 2012-04-26 DIAGNOSIS — C539 Malignant neoplasm of cervix uteri, unspecified: Secondary | ICD-10-CM

## 2012-04-26 DIAGNOSIS — S32030D Wedge compression fracture of third lumbar vertebra, subsequent encounter for fracture with routine healing: Secondary | ICD-10-CM

## 2012-04-26 DIAGNOSIS — R109 Unspecified abdominal pain: Secondary | ICD-10-CM

## 2012-04-26 MED ORDER — FENTANYL 50 MCG/HR TD PT72: 1.0000 | MEDICATED_PATCH | TRANSDERMAL | Status: DC

## 2012-04-26 NOTE — Patient Instructions (Signed)
It was good to see you today. We have reviewed your prior records including labs and tests today Stop OxyContin 20 mg twice daily and begin fentanyl patch 50 mcg, change patch every 72 hours -prescription for same provided to you today Continue short acting Percocet 10 every 4 hours as needed I will review with Dr. Darrold Span and your other physicians regarding possible causes for your pain What we do next depends on what we find.Marland KitchenMarland KitchenMarland Kitchen

## 2012-04-26 NOTE — Progress Notes (Signed)
Subjective:    Patient ID: [name], female    DOB: 1950/06/06, 62 y.o.   MRN: [id number]  HPI  Here for followup Reviewed interval history Continued severe pain along left flank from spine to left iliac crest Pain currently 8 of 10, causing difficulty with ambulation Not controlled with current pain regimen: OxyContin 20 twice a day plus Percocet 10 every 4 hours when necessary  Past Medical History  Diagnosis Date  . Diastolic dysfunction   . MIGRAINE HEADACHE   . Atrial fibrillation     failed DCCV, CHADs2-prev pradaxa stopped due to hematuria with ureter issues/JJ   . Anemia   . HTN (hypertension)   . GLAUCOMA   . CARPAL TUNNEL SYNDROME, RIGHT   . Lymphedema     L>R leg from pelvic XRT/scarring  . Hepatic steatosis     CT scan 06/2009, 12/2009  . DVT (deep venous thrombosis) 10/2010    LLE, anticaog resumed  . CHF (congestive heart failure)     1 yr ago per pt  . Dyspnea on exertion   . Arthritis   . GERD (gastroesophageal reflux disease)   . Lesion of liver   . Small kidney     left  . Difficulty sleeping   . Radiation 10/11/11-11/19/11    5040 cGy in 28 fx's/periaortic  . Radiation 03/16/10-03/25/10    right inguinal node/left external iliac node  . Radiation 06/11/08-07/23/08    6000 cGy left pelvis  . Clotting disorder   . Hyperlipidemia   . Leg swelling   . Nausea   . Bruises easily   . Weakness   . Cervical cancer dx 04/2003, recur 04/2008    s/p debulk (TAHBSO), chemo/XRT; recurrent dz L pelvis dx 2010 and liver met 09/2010 s/p RFA  . Recurrent cervical cancer     Review of Systems  Constitutional: Negative for fever and fatigue.  Gastrointestinal: Negative for bowel incontinence.  Genitourinary: Positive for flank pain. Negative for bladder incontinence.  Musculoskeletal: Positive for back pain and gait problem. Negative for myalgias and joint swelling.  Skin: Negative for rash and wound.  Neurological: Positive for weakness. Negative for numbness  and headaches.       Objective:   Physical Exam  BP 100/62  Pulse 98  Temp(Src) 97 F (36.1 C) (Oral)  SpO2 95% Wt Readings from Last 3 Encounters:  04/11/12 198 lb 12.8 oz (90.175 kg)  04/04/12 192 lb (87.091 kg)  03/21/12 197 lb 11.2 oz (89.676 kg)   Constitutional: She is obese, but appears well-developed and well-nourished. Uncomfortable but no distress. sitting in wheelchair for support. Friend at side Neck: Normal range of motion. Neck supple. No JVD present. No thyromegaly present.  Cardiovascular: Normal rate, regular rhythm and normal heart sounds.  No murmur heard. No BLE edema. Pulmonary/Chest: Effort normal and breath sounds normal. No respiratory distress. She has no wheezes.  Musculoskeletal: Back: limited range of motion of thoracic and lumbar spine. Non tender to palpation. patient ambulates with antalgic gait. Psychiatric: She has a dysphoric mood and affect. Her behavior is normal. Judgment and thought content normal.   Lab Results  Component Value Date   WBC 3.8* 04/24/2012   HGB 10.6* 04/24/2012   HCT 31.0* 04/24/2012   PLT 200 04/24/2012   GLUCOSE 103* 04/24/2012   ALT 32 04/24/2012   AST 19 04/24/2012   NA 139 04/24/2012   K 4.3 04/24/2012   CL 103 04/24/2012   CREATININE 0.94 04/24/2012   BUN  12 04/24/2012   CO2 27 04/24/2012   TSH 1.700 05/16/2010   INR 1.04 04/24/2012   HGBA1C 5.2 09/19/2008    Mr Lumbar Spine Wo Contrast  04/20/2012  *RADIOLOGY REPORT*  Clinical Data: 62 year old female with recent kyphoplasty.  History of recurrent cervical cancer.  New onset severe back pain.   IMPRESSION: 1.  New mild L1 superior endplate fracture.  Mild bony retropulsion without spinal stenosis.  The MRI appearance is very similar to the L3 fracture at its presentation, and as before no discrete bone metastasis is identified. 2.  L3 compression fracture now status post augmentation with no adverse features identified. 3.  Stable abnormal left kidney and left pararenal  retroperitoneal soft tissues.   Original Report Authenticated By: Erskine Speed, M.D.    Ct Abdomen Pelvis W Contrast  04/07/2012  *RADIOLOGY REPORT*  Clinical Data: Metastatic cervical cancer  CT ABDOMEN AND PELVIS WITH CONTRAST  IMPRESSION:  1.  Multifocal low attenuation lesions within the liver parenchyma consistent with metastatic disease. Most of these lesions have increased in size from previous exam. 2.  Periaortic lymph node metastasis involving the left kidney has decreased in size from previous exam.  There has been progressive atrophy of the left kidney. 3.  Resolution of fluid attenuating mass within the right inguinal region.  There is a new small fluid attenuating structure within the distal left external iliac lymph node chain which may represent a lymphocele or seroma.  Necrotic lymph node is also a diagnostic consideration.   Original Report Authenticated By: Signa Kell, M.D.    Ir Kyphoplasty Or Sacroplasty  04/25/2012  *RADIOLOGY REPORT*  Clinical Data:  New onset of worsening low back pain secondary to compression fracture at L1. [name]  IMPRESSION: 1. Status post fluoroscopic-guided needle placement for deep core bone biopsy at L1 2. Status post vertebral body augmentation for painful compression fracture at L1 using the balloon kyphoplasty technique.   Original Report Authenticated By: Julieanne Cotton, M.D.    Ir Kyphoplasty Or Sacroplasty  04/11/2012  *RADIOLOGY REPORT*  Clinical Data:  New onset severe low back pain secondary to compression fracture at L3.  IMPRESSION: 1.  Status post fluoroscopic-guided needle placement for deep core bone biopsy at L3. 2.  Status post vertebral body augmentation for painful compression fracture at L3 using the balloon kyphoplasty technique.   Original Report Authenticated By: Julieanne Cotton, M.D.      Assessment & Plan:   Left flank and low back pain -etiology unclear. Differential includes radiculopathy, referred pain from cancer,  musculoskeletal  Recent augmentation via kyphoplasty of L3, then L1 compression fractures -improvement in same left flank pain initially following L3 kyphoplasty, been recurrent and severe 48 hours following intervention while cleaning. Unchanged pain since L1 kyphoplasty  Chronic low back and pelvic pain related to metastatic disease -change approach narcotic management from oxycodone to fentanyl patch -new prescriptions provided and instructions to patient on how to transition same  Metastatic cervical cancer, ongoing treatment with medical oncology and radiation oncology. Last CT scan reviewed with increasing metastases in liver, and decreased left para-aortic lymph nodes following radiation  Time spent with pt/family today 30 minutes, greater than 50% time spent counseling patient on Interval history, kyphoplasty of compression fracture L1 and L3, pain management and metastatic cervical cancer. Also review of interval medical records

## 2012-04-26 NOTE — Telephone Encounter (Signed)
Pt states that neither medications are managing pain sxs. Pt has upcoming appt to discuss options

## 2012-04-27 ENCOUNTER — Encounter: Payer: Self-pay | Admitting: Internal Medicine

## 2012-04-27 MED ORDER — OXYCODONE-ACETAMINOPHEN 10-325 MG PO TABS: 0.5000 | ORAL_TABLET | ORAL | Status: DC | PRN

## 2012-04-27 NOTE — Telephone Encounter (Signed)
Given to [facility] for pt pick up

## 2012-04-28 ENCOUNTER — Encounter: Payer: Self-pay | Admitting: Internal Medicine

## 2012-05-01 ENCOUNTER — Ambulatory Visit: Payer: 59 | Admitting: Internal Medicine

## 2012-05-01 NOTE — Telephone Encounter (Signed)
It is likely the swelling exacerbates your pain  - being on the xarelto and avoiding any procedures will be out best bet on minimizing/treating the swelling.  Thanks and i'll let you know if anything new comes to mind

## 2012-05-11 ENCOUNTER — Other Ambulatory Visit: Payer: Self-pay | Admitting: *Deleted

## 2012-05-11 MED ORDER — TIZANIDINE HCL 4 MG PO CAPS: 4.0000 mg | ORAL_CAPSULE | Freq: Three times a day (TID) | ORAL | Status: DC

## 2012-05-16 ENCOUNTER — Ambulatory Visit (HOSPITAL_BASED_OUTPATIENT_CLINIC_OR_DEPARTMENT_OTHER): Payer: 59 | Admitting: Oncology

## 2012-05-16 ENCOUNTER — Ambulatory Visit: Payer: 59

## 2012-05-16 ENCOUNTER — Telehealth: Payer: Self-pay | Admitting: Oncology

## 2012-05-16 ENCOUNTER — Encounter: Payer: Self-pay | Admitting: Oncology

## 2012-05-16 ENCOUNTER — Other Ambulatory Visit (HOSPITAL_BASED_OUTPATIENT_CLINIC_OR_DEPARTMENT_OTHER): Payer: 59 | Admitting: Lab

## 2012-05-16 VITALS — BP 109/74 | HR 101 | Temp 97.3°F | Resp 18 | Ht 61.5 in | Wt 190.3 lb

## 2012-05-16 DIAGNOSIS — C539 Malignant neoplasm of cervix uteri, unspecified: Secondary | ICD-10-CM

## 2012-05-16 DIAGNOSIS — C787 Secondary malignant neoplasm of liver and intrahepatic bile duct: Secondary | ICD-10-CM

## 2012-05-16 DIAGNOSIS — I4891 Unspecified atrial fibrillation: Secondary | ICD-10-CM

## 2012-05-16 DIAGNOSIS — I82409 Acute embolism and thrombosis of unspecified deep veins of unspecified lower extremity: Secondary | ICD-10-CM

## 2012-05-16 LAB — CBC WITH DIFFERENTIAL/PLATELET
BASO%: 1.2 % (ref 0.0–2.0)
EOS%: 3.4 % (ref 0.0–7.0)
HCT: 32 % — ABNORMAL LOW (ref 34.8–46.6)
LYMPH%: 14.3 % (ref 14.0–49.7)
MCH: 30.4 pg (ref 25.1–34.0)
MCHC: 34.6 g/dL (ref 31.5–36.0)
MCV: 87.7 fL (ref 79.5–101.0)
MONO#: 0.6 10*3/uL (ref 0.1–0.9)
MONO%: 13.8 % (ref 0.0–14.0)
NEUT%: 67.3 % (ref 38.4–76.8)
Platelets: 216 10*3/uL (ref 145–400)
RBC: 3.65 10*6/uL — ABNORMAL LOW (ref 3.70–5.45)

## 2012-05-16 LAB — COMPREHENSIVE METABOLIC PANEL (CC13)
ALT: 22 U/L (ref 0–55)
Alkaline Phosphatase: 197 U/L — ABNORMAL HIGH (ref 40–150)
CO2: 23 mEq/L (ref 22–29)
Creatinine: 1.3 mg/dL — ABNORMAL HIGH (ref 0.6–1.1)
Total Bilirubin: 0.5 mg/dL (ref 0.20–1.20)

## 2012-05-16 MED ORDER — SODIUM CHLORIDE 0.9 % IJ SOLN
10.0000 mL | INTRAMUSCULAR | Status: DC | PRN
  Filled 2012-05-16: qty 10

## 2012-05-16 MED ORDER — HEPARIN SOD (PORK) LOCK FLUSH 100 UNIT/ML IV SOLN
250.0000 [IU] | INTRAVENOUS | Status: DC | PRN
  Filled 2012-05-16: qty 5

## 2012-05-16 MED ORDER — SODIUM CHLORIDE 0.9 % IJ SOLN
10.0000 mL | INTRAMUSCULAR | Status: AC | PRN
  Administered 2012-05-16: 10 mL
  Filled 2012-05-16: qty 10

## 2012-05-16 MED ORDER — FENTANYL 75 MCG/HR TD PT72: 1.0000 | MEDICATED_PATCH | TRANSDERMAL | Status: DC

## 2012-05-16 MED ORDER — HEPARIN SOD (PORK) LOCK FLUSH 100 UNIT/ML IV SOLN
500.0000 [IU] | INTRAVENOUS | Status: AC | PRN
  Administered 2012-05-16: 500 [IU]
  Filled 2012-05-16: qty 5

## 2012-05-16 NOTE — Progress Notes (Signed)
OFFICE PROGRESS NOTE   05/16/2012   Physicians:J.Kinard, V.Leschber, D.ClarkePearson, J.Allred, D.Margarita Grizzle, H.Derrell Lolling   INTERVAL HISTORY:    Patient is seen, together with her significant other, in scheduled follow up of her metastatic cervical cancer, most recently progressing in liver on low dose taxol/carbo x 3 cycles thru 03-28-12.When I saw her last on 04-13-12, we decided to hold off on additional chemotherapy at least until we discuss again today.  History is of radical hysterectomy, bilateral salpingo-oophorectomy and pelvic lymphadenectomy in March of 2005 by Dr Ronita Hipps for a stage IB cervical cancer. Final pathology showed no residual disease and all lymph nodes were negative. The patient was followed for 5 years with no evidence of disease until she developed a recurrence of the right pelvic sidewall in February of 2010. This caused obstruction of the right ureter. She was treated with radiation therapy and concurrent cisplatin chemotherapy. In late 2011 she had recurrence in right inguinal lymph node and left pelvic nodes and received additional radiation therapy to those areas, completed in January 2012. In August 2012 she had a 2.8 cm liver lesion and received 6 cycles of Taxol + one cycle of carboplatin. She had a partial response to chemo, then increase in the solitary liver lesion and RFA was performed on 10/07/2011. 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 After ~ 3 months she was changed to xarelto by Dr Felicity Coyer because of local bruising and discomfort from the lovenox injections; she continues xarelto now. She had progression in left periaortic nodes which was symptomatic with pain and treated by Dr Roselind Messier with 5040 cGY in 28 fractions from 10-11-11 thru 11-19-11. Pain has improved since radiation and she has been able to decrease pain  medication to present oycontin 20 mg q 12 hrs with prn percocet. CT CAP repeated 12-31-11 found multiple new liver lesions, small left inguinal nodes and a 5 cm fluid collection right groin. Chemotherapy was resumed with low dose carboplatin and taxol on 02-01-12, three cycles thru 03-28-12 Restaging CT AP 04-07-12 unfortunately showed increase in size of most of the liver mets, tho no new disease elsewhere.   Since she was here a month ago, she has had new bilateral SI joint pain, R>L, not improved with kyphoplasty at L1 (previous kyphoplasty of L3 in Jan). The SI discomfort was gradually improving until she developed viral URI/ allergies with cough this week. Patient does tell me that, if it were not for the SI pain and URI, she would feel the best now that she has in several months, which seems related to the chemo treatment break. She denies SOB or productive cough and has not had fever. Appetite is not great, but little nausea now. She denies constipation. Very little drainage from the right inguinal lymphocoele. No bleeding. Less LE swelling. She has been on duragesic patch 50 mcg q 72 hrs, which is not controlling pain now. She asks about "taking it off since it is not working", but agrees instead to increase to 75 mcg. Remainder of 10 point Review of Systems negative.  Objective:  Vital signs in last 24 hours:  BP 109/74  Pulse 101  Temp(Src) 97.3 F (36.3 C) (Oral)  Resp 18  Ht 5' 1.5" (1.562 m)  Wt 190 lb 4.8 oz (86.32 kg)  BMI 35.38 kg/m2 Slowly ambulatory, has difficulty standing from sitting position and  cannot lie back on exam table.   HEENT:PERRLA, extra ocular movement intact, sclera clear, anicteric, oropharynx clear, no lesions and neck supple with midline trachea Nasal turbinates without purulent drainage. LymphaticsCervical, supraclavicular, and axillary nodes normal. Resp: clear to auscultation bilaterally and normal percussion bilaterally Back without rash or ecchymoses  across low back Cardio: regular rate and rhythm GI: soft, full upper abdomen, some BS, not tender Extremities: minimal LLE edema, no tenderness, no other edema Neuro:no sensory deficits noted Skin not icteric, no rash or ecchymosis Portacath without erythema or tenderness, flushed today.  Lab Results:  Results for orders placed during the hospital encounter of 04/24/12  APTT      Result Value Range   aPTT 29  24 - 37 seconds  CBC WITH DIFFERENTIAL      Result Value Range   WBC 3.8 (*) 4.0 - 10.5 K/uL   RBC 3.49 (*) 3.87 - 5.11 MIL/uL   Hemoglobin 10.6 (*) 12.0 - 15.0 g/dL   HCT 16.1 (*) 09.6 - 04.5 %   MCV 88.8  78.0 - 100.0 fL   MCH 30.4  26.0 - 34.0 pg   MCHC 34.2  30.0 - 36.0 g/dL   RDW 40.9 (*) 81.1 - 91.4 %   Platelets 200  150 - 400 K/uL   Neutrophils Relative 73  43 - 77 %   Neutro Abs 2.8  1.7 - 7.7 K/uL   Lymphocytes Relative 13  12 - 46 %   Lymphs Abs 0.5 (*) 0.7 - 4.0 K/uL   Monocytes Relative 12  3 - 12 %   Monocytes Absolute 0.5  0.1 - 1.0 K/uL   Eosinophils Relative 1  0 - 5 %   Eosinophils Absolute 0.1  0.0 - 0.7 K/uL   Basophils Relative 0  0 - 1 %   Basophils Absolute 0.0  0.0 - 0.1 K/uL  COMPREHENSIVE METABOLIC PANEL      Result Value Range   Sodium 139  135 - 145 mEq/L   Potassium 4.3  3.5 - 5.1 mEq/L   Chloride 103  96 - 112 mEq/L   CO2 27  19 - 32 mEq/L   Glucose, Bld 103 (*) 70 - 99 mg/dL   BUN 12  6 - 23 mg/dL   Creatinine, Ser 7.82  0.50 - 1.10 mg/dL   Calcium 95.6  8.4 - 21.3 mg/dL   Total Protein 7.0  6.0 - 8.3 g/dL   Albumin 3.5  3.5 - 5.2 g/dL   AST 19  0 - 37 U/L   ALT 32  0 - 35 U/L   Alkaline Phosphatase 208 (*) 39 - 117 U/L   Total Bilirubin 0.4  0.3 - 1.2 mg/dL   GFR calc non Af Amer 65 (*) >90 mL/min   GFR calc Af Amer 75 (*) >90 mL/min  PROTIME-INR      Result Value Range   Prothrombin Time 13.5  11.6 - 15.2 seconds   INR 1.04  0.00 - 1.49     Studies/Results:  I have reviewed MRI spine from 04-20-12, which did not include  SI areas and also the CT AP from 04-07-12, with no obvious problems at SI joints that I can tell. CT images reviewed with patient on PACS.  Medications: I have reviewed the patient's current medications. She will increase duragesic to 75 mcg q 72 hrs. She will begin calcium with D; we may consider IV bisphosphonate due to these osteoporotic compression fractures.  Hopefully increase in the  duragesic will make her more comfortable and the URI symptoms will improve in next few days. We have some possibilities for further chemotherapy (alimta or weekly topotecan seem best for her, tho response rates only ~ 15%), but we are all in agreement that additional treatment break is preferable to resuming chemo now.  Assessment/Plan:  1.Metastatic cervical cancer to liver, inguinal and periaortic nodes: progression in majority of liver mets despite taxol/carbo x 3 cycles. For quality of life, we will continue on treatment break until I see her back in ~4 weeks.  3.atrial fibrillation and recurrent LE DVT: on xarelto  4.IVC filter in  5.right inguinal lymphocele: much less drainage, and improved on CT  6.atrophic left kidney  7.LLE swelling due to surgery and pelvic RT and previous DVT.  8.PAC in, flushed today. 9. Lumbar compression fractures post kyphoplasties, and pain now in bilateral SI areas. Increase Duragesic to 75 mcg now. 10. Viral URI vs allergies: no indication for antibiotics presently.  Patient and friend are in agreement with plan above. Time spent 30 min, >50% in discussion  LIVESAY,LENNIS P, MD   05/16/2012, 10:02 AM

## 2012-05-16 NOTE — Telephone Encounter (Signed)
gv and printed appt schedule for pt for April °

## 2012-05-25 ENCOUNTER — Ambulatory Visit (INDEPENDENT_AMBULATORY_CARE_PROVIDER_SITE_OTHER): Payer: 59 | Admitting: Internal Medicine

## 2012-05-25 ENCOUNTER — Encounter: Payer: Self-pay | Admitting: Internal Medicine

## 2012-05-25 VITALS — BP 104/78 | HR 77 | Ht 61.5 in | Wt 194.0 lb

## 2012-05-25 DIAGNOSIS — I5032 Chronic diastolic (congestive) heart failure: Secondary | ICD-10-CM

## 2012-05-25 DIAGNOSIS — I10 Essential (primary) hypertension: Secondary | ICD-10-CM

## 2012-05-25 DIAGNOSIS — I4891 Unspecified atrial fibrillation: Secondary | ICD-10-CM

## 2012-05-25 NOTE — Patient Instructions (Addendum)
Your physician wants you to follow-up in: 12 months with Dr Allred You will receive a reminder letter in the mail two months in advance. If you don't receive a letter, please call our office to schedule the follow-up appointment.  

## 2012-05-25 NOTE — Progress Notes (Signed)
PCP: [name], MD  [name] is a 62 y.o. female who presents today for routine cardiology followup.  Since last being seen in our clinic, the patient has struggled with ongoing malignancy.  Her afib appears to be stable.  She is not overtly symptomatic.  Today, she denies symptoms of palpitations, chest pain, shortness of breath,  lower extremity edema, dizziness, presyncope, or syncope.  The patient is otherwise without complaint today.   Past Medical History  Diagnosis Date  . Diastolic dysfunction   . MIGRAINE HEADACHE   . Atrial fibrillation     failed DCCV, CHADs2-prev pradaxa stopped due to hematuria with ureter issues/JJ   . Anemia   . HTN (hypertension)   . GLAUCOMA   . CARPAL TUNNEL SYNDROME, RIGHT   . Lymphedema     L>R leg from pelvic XRT/scarring  . Hepatic steatosis     CT scan 06/2009, 12/2009  . DVT (deep venous thrombosis) 10/2010    LLE, anticaog resumed  . CHF (congestive heart failure)     1 yr ago per pt  . Dyspnea on exertion   . Arthritis   . GERD (gastroesophageal reflux disease)   . Lesion of liver   . Small kidney     left  . Difficulty sleeping   . Radiation 10/11/11-11/19/11    5040 cGy in 28 fx's/periaortic  . Radiation 03/16/10-03/25/10    right inguinal node/left external iliac node  . Radiation 06/11/08-07/23/08    6000 cGy left pelvis  . Clotting disorder   . Hyperlipidemia   . Leg swelling   . Nausea   . Bruises easily   . Weakness   . Cervical cancer dx 04/2003, recur 04/2008    s/p debulk (TAHBSO), chemo/XRT; recurrent dz L pelvis dx 2010 and liver met 09/2010 s/p RFA  . Recurrent cervical cancer    Past Surgical History  Procedure Laterality Date  . Ureteral stent placement  2005, 01/2010, 07/2010    L hydro related to cervical ca and XRT  . Oophorectomy  2005  . Cardioversion    . Tonsillectomy    . Cholecystectomy  1984  . Total abdominal hysterectomy  2005  . Carpal tunnel  2009    right  . Ivc filter  04/2011     Current Outpatient Prescriptions  Medication Sig Dispense Refill  . cetirizine (ZYRTEC) 10 MG tablet Take 10 mg by mouth daily.      . fentaNYL (DURAGESIC - DOSED MCG/HR) 75 MCG/HR Place 1 patch (75 mcg total) onto the skin every 3 (three) days.  10 patch  0  . ibuprofen (ADVIL,MOTRIN) 200 MG tablet Take 600 mg by mouth 3 (three) times daily as needed for pain. Pain       . LORazepam (ATIVAN) 1 MG tablet Take 1 mg by mouth every 6 (six) hours as needed (for nausea may put under tongue).       . metoprolol (LOPRESSOR) 50 MG tablet Take 1 tablet (50 mg total) by mouth 2 (two) times daily.  60 tablet  1  . Multiple Vitamin (MULITIVITAMIN WITH MINERALS) TABS Take 1 tablet by mouth daily.      [name] omeprazole (PRILOSEC) 20 MG capsule Take 20 mg by mouth daily.       . ondansetron (ZOFRAN) 8 MG tablet Take by mouth every 8 (eight) hours as needed for nausea.      [name] oxyCODONE-acetaminophen (PERCOCET) 10-325 MG per tablet Take 0.5-1 tablets by mouth every 4 (four)  hours as needed for pain.  60 tablet  0  . polyethylene glycol (MIRALAX / GLYCOLAX) packet Take 17 g by mouth 2 (two) times daily as needed.      . Rivaroxaban (XARELTO) 20 MG TABS Take 1 tablet (20 mg total) by mouth daily.  30 tablet  5  . sodium chloride (OCEAN) 0.65 % nasal spray Place 1 spray into the nose daily as needed for congestion. Allergies       . tiZANidine (ZANAFLEX) 4 MG capsule Take 1 capsule (4 mg total) by mouth 3 (three) times daily.  40 capsule  1  . triamcinolone (NASACORT) 55 MCG/ACT nasal inhaler Place 2 sprays into the nose daily.      [name] zolpidem (AMBIEN) 5 MG tablet Take 1 tablet (5 mg total) by mouth at bedtime as needed for sleep.  20 tablet  0   No current facility-administered medications for this visit.    Physical Exam: Filed Vitals:   05/25/12 0932  BP: 104/78  Pulse: 77  Height: 5' 1.5" (1.562 m)  Weight: 194 lb (87.998 kg)    GEN- The patient is well appearing, alert and oriented x 3 today.    Head- normocephalic, atraumatic Eyes-  Sclera clear, conjunctiva pink Ears- hearing intact Oropharynx- clear Lungs- Clear to ausculation bilaterally, normal work of breathing Heart- irregular rate and rhythm, no murmurs, rubs or gallops, PMI not laterally displaced GI- soft, NT, ND, + BS Extremities- no clubbing, cyanosis, +1 L leg edema  ekg today reveals afib, V rate 77, otherwise normal ekg  Assessment and Plan:  1. afib Stable and rate controlled Continue xarelto  2. HTN Stable No change required today  3. Diastolic dysfunction Very stable  Return in 1 year

## 2012-05-26 ENCOUNTER — Other Ambulatory Visit: Payer: Self-pay

## 2012-05-26 DIAGNOSIS — J069 Acute upper respiratory infection, unspecified: Secondary | ICD-10-CM

## 2012-05-26 MED ORDER — AZITHROMYCIN 250 MG PO TABS: ORAL_TABLET | ORAL | Status: DC

## 2012-05-26 NOTE — Progress Notes (Signed)
[name] called stating that her upper respiratory symptoms have gotten worse since visit 05-16-12.  She ran a temp of 101.6 yesterday.   Dr. Darrold Span said to call in a Z- Pack.  Told [name] that if her symptoms do not improve with ATB that she will need to be evaluated by her PCP.  [name] verbalized understanding.

## 2012-06-01 ENCOUNTER — Telehealth: Payer: Self-pay | Admitting: Internal Medicine

## 2012-06-01 MED ORDER — OXYCODONE-ACETAMINOPHEN 10-325 MG PO TABS: 0.5000 | ORAL_TABLET | ORAL | Status: DC | PRN

## 2012-06-01 NOTE — Telephone Encounter (Signed)
Notified pt rx ready for pick-up.../lmb 

## 2012-06-14 ENCOUNTER — Other Ambulatory Visit (HOSPITAL_BASED_OUTPATIENT_CLINIC_OR_DEPARTMENT_OTHER): Payer: 59 | Admitting: Lab

## 2012-06-14 ENCOUNTER — Telehealth: Payer: Self-pay | Admitting: Oncology

## 2012-06-14 ENCOUNTER — Telehealth: Payer: Self-pay | Admitting: *Deleted

## 2012-06-14 ENCOUNTER — Ambulatory Visit (HOSPITAL_BASED_OUTPATIENT_CLINIC_OR_DEPARTMENT_OTHER): Payer: 59 | Admitting: Oncology

## 2012-06-14 ENCOUNTER — Other Ambulatory Visit: Payer: Self-pay | Admitting: *Deleted

## 2012-06-14 ENCOUNTER — Encounter: Payer: Self-pay | Admitting: Oncology

## 2012-06-14 VITALS — BP 107/73 | HR 92 | Temp 97.3°F | Resp 20 | Ht 61.5 in | Wt 197.2 lb

## 2012-06-14 DIAGNOSIS — C539 Malignant neoplasm of cervix uteri, unspecified: Secondary | ICD-10-CM

## 2012-06-14 LAB — CBC WITH DIFFERENTIAL/PLATELET
Eosinophils Absolute: 0.2 10*3/uL (ref 0.0–0.5)
HCT: 32.2 % — ABNORMAL LOW (ref 34.8–46.6)
LYMPH%: 17 % (ref 14.0–49.7)
MCHC: 32.9 g/dL (ref 31.5–36.0)
MCV: 85.6 fL (ref 79.5–101.0)
MONO#: 0.5 10*3/uL (ref 0.1–0.9)
NEUT%: 64.8 % (ref 38.4–76.8)
Platelets: 203 10*3/uL (ref 145–400)
WBC: 3.9 10*3/uL (ref 3.9–10.3)

## 2012-06-14 MED ORDER — HYDROMORPHONE HCL 2 MG PO TABS: 2.0000 mg | ORAL_TABLET | ORAL | Status: DC | PRN

## 2012-06-14 MED ORDER — FENTANYL 100 MCG/HR TD PT72: 1.0000 | MEDICATED_PATCH | TRANSDERMAL | Status: DC

## 2012-06-14 NOTE — Patient Instructions (Addendum)
Increase duragesic patch to 100 micrograms every 72 hours.  OK to use percocet 1-2 every 4-6 hours as needed    Dr Sheran Luz  [facility] [phone]  [address] Wed April 23 arrive 1:30 for 2:00 appointment

## 2012-06-14 NOTE — Telephone Encounter (Signed)
Message copied by Carola Rhine A on Wed Jun 14, 2012  1:24 PM ------      Message from: Jama Flavors P      Created: Wed Jun 14, 2012 12:24 PM       New pt referral made to Dr Sheran Luz for visit 06-21-12. Please call [phone] with insurance and demographic information today ------

## 2012-06-14 NOTE — Telephone Encounter (Signed)
Called Dr Sheran Luz office and provided info requested.

## 2012-06-14 NOTE — Progress Notes (Signed)
OFFICE PROGRESS NOTE   06/14/2012   Physicians:J.Kinard, V.Leschber, D.ClarkePearson, J.Allred, D.Margarita Grizzle, H.Derrell Lolling, Kathie Rhodes.Deveshwar (IR). New patient appointment scheduled to Dr Rosine Abe for 06-21-12   INTERVAL HISTORY:   Patient is seen, together with significant other, in scheduled follow up of metastatic cervical cancer involving pelvis, retroperitoneal nodes and liver, on treatment break since progression in liver on taxol/ carboplatin x 3 cycles thru 03-28-12. Situation has been complicated recently by multiple vertebral compression fractures treated with kyphoplasty procedures. She has IVC filter in for LE DVTs and is on Xarelto for a fib and the DVTs.  She has had acute new right low back pain since yesterday, which happened when she stood up carefully from couch. Prior to the new low back pain, she was feeling better overall, including more comfortable tho not completely controlled on duragesic 75 q 72 hrs with prn ibuprofen and percocet 10-325. She was aware of increased pain after 48 hrs on each duragesic.  She has PAC in, last flushed 05-16-12.  Oncologic History Patient had radical hysterectomy, bilateral salpingo-oophorectomy and pelvic lymphadenectomy in March of 2005 by Dr Ronita Hipps for a stage IB cervical cancer. Final pathology showed no residual disease and all lymph nodes were negative. The patient was followed for 5 years with no evidence of disease until she developed a recurrence of the right pelvic sidewall in February of 2010. This caused obstruction of the right ureter. She was treated with radiation therapy and concurrent cisplatin chemotherapy. In late 2011 she had recurrence in right inguinal lymph node and left pelvic nodes and received additional radiation therapy to those areas, completed in January 2012. In August 2012 she had a 2.8 cm liver lesion and received 6 cycles of Taxol + one cycle of carboplatin. She had a partial response to chemo, then increase in the solitary  liver lesion and RFA was performed on 10/07/2011. 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 After ~ 3 months she was changed to xarelto by Dr Felicity Coyer because of local bruising and discomfort from the lovenox injections; she continues xarelto now. She had progression in left periaortic nodes which was symptomatic with pain and treated by Dr Roselind Messier with 5040 cGY in 28 fractions from 10-11-11 thru 11-19-11. Pain has improved since radiation and she has been able to decrease pain medication to present oycontin 20 mg q 12 hrs with prn percocet. CT CAP repeated 12-31-11 found multiple new liver lesions, small left inguinal nodes and a 5 cm fluid collection right groin. Chemotherapy was resumed with low dose carboplatin and taxol on 02-01-12, three cycles thru 03-28-12, complicated by cytopenias despite decreased doses. Restaging CT AP 04-07-12 unfortunately showed increase in size of most of the liver mets, tho no new disease elsewhere. WIth no very good options for chemotherapy and as patient needed treatment break, she has been on observation since that scan.  No fever or symptoms of infection. Appetite ok prior to this pain. Increased swelling LE especially left since she had to stay in chair last night due to back pain. No respiratory symptoms. Bowels moving and she understands that she may need to increase laxatives with increase in pain medication. No bleeding, no chest pain. Remainder of 10 point Review of Systems negative.  We have discussed another CT to look at retroperitoneal disease, however she is too uncomfortable now to tolerate scan.  Objective:  Vital signs in last 24 hours:  BP 107/73  Pulse 92  Temp(Src) 97.3 F (36.3 C) (Oral)  Resp 20  Ht 5' 1.5" (1.562 m)  Wt 197 lb 3.2 oz (89.449 kg)  BMI 36.66 kg/m2 Weight is up 7 lbs. Alert, looks  moderately uncomfortable seated in WC. Respirations not labored. Excellent historian, friend very supportive.  HEENT:PERRLA, extra ocular movement intact, sclera clear, anicteric, oropharynx clear, no lesions and neck supple with midline trachea LymphaticsCervical, supraclavicular, and axillary nodes normal. Resp: clear to auscultation bilaterally Cardio: irregularly irregular rhythm GI: soft, quiet, not more distended, not tender including epigastrium. Full RUQ tho cannot tell liver edge clearly, no rub Extremities: 1+ swelling RLE and 2-3+ LLE, no weeping. Neuro:stable Skin without rash or ecchymosis Portacath-without erythema or tenderness  Lab Results:  Results for orders placed in visit on 06/14/12  CBC WITH DIFFERENTIAL      Result Value Range   WBC 3.9  3.9 - 10.3 10e3/uL   NEUT# 2.5  1.5 - 6.5 10e3/uL   HGB 10.6 (*) 11.6 - 15.9 g/dL   HCT 40.9 (*) 81.1 - 91.4 %   Platelets 203  145 - 400 10e3/uL   MCV 85.6  79.5 - 101.0 fL   MCH 28.2  25.1 - 34.0 pg   MCHC 32.9  31.5 - 36.0 g/dL   RBC 7.82  9.56 - 2.13 10e6/uL   RDW 14.1  11.2 - 14.5 %   lymph# 0.7 (*) 0.9 - 3.3 10e3/uL   MONO# 0.5  0.1 - 0.9 10e3/uL   Eosinophils Absolute 0.2  0.0 - 0.5 10e3/uL   Basophils Absolute 0.1  0.0 - 0.1 10e3/uL   NEUT% 64.8  38.4 - 76.8 %   LYMPH% 17.0  14.0 - 49.7 %   MONO% 12.1  0.0 - 14.0 %   EOS% 4.6  0.0 - 7.0 %   BASO% 1.5  0.0 - 2.0 %    Hemoglobin is essentially stable.  Bone biopsies with kyphoplasty procedures 2-10 and 2-24 had no malignancy, these reviewed with patient now.  Studies/Results:   Pathology  Accession #: [id number]  Collected Date: 04/24/2012 Bone, biopsy, Lumbar 1 Vertebral body - FRAGMENTS OF BENIGN BONE, MARROW AND BLOOD.  Accession #: [id number] Collected Date: 04/10/2012  Bone, biopsy, Lumbar 3 vertebral body - FRAGMENTS OF BONE WITH BLOOD AND MARROW. - NEGATIVE FOR MALIGNANCY.  Last CT AP was 04-07-12  Medications: I have reviewed the patient's  current medications. We will increase duragesic to 100 mcg every 72 hours. She can use 1-2 of the 10-325 oxycodone APAP every 4-6 hours, and is also given a prescription for dilaudid 2 mg 1-2 every 4 hrs prn.   We have discussed evaluation by orthopedist who specializes in nonsurgical back treatment different from kyphoplasty procedures by interventional radiology. She would like this referral and I have been able to schedule her to see Dr Ethelene Hal on 06-21-12. She has not been on bisphosphonates for low bone density.   Assessment/Plan: 1.Metastatic cervical cancer to liver, inguinal and periaortic nodes: progression in majority of liver mets despite taxol/carbo x 3 cycles. She is not stable from back pain standpoint to consider additional chemotherapy now, which she also does not tolerate well and limited options available. I will see her back with PAC flush in ~3 weeks. 3.atrial fibrillation and recurrent LE DVT: on xarelto  4.IVC filter in  5.right inguinal lymphocele: much less drainage, and improved on CT  6.atrophic left kidney  7.LLE swelling due  to surgery and pelvic RT and previous DVT.  8.PAC in, flushed 05-16-12.  9. Lumbar compression fractures post kyphoplasties, and pain now in right low back. Increase Duragesic to 100 mcg q 72 hours with prn dilaudid. Referral made to Dr Ethelene Hal, to be seen next available which is 06-21-12.  Patient follows discussion well and is in agreement with plan.  Reece Packer, MD   06/14/2012, 5:19 PM

## 2012-06-22 ENCOUNTER — Other Ambulatory Visit: Payer: Self-pay | Admitting: Physical Medicine and Rehabilitation

## 2012-06-22 DIAGNOSIS — M47817 Spondylosis without myelopathy or radiculopathy, lumbosacral region: Secondary | ICD-10-CM

## 2012-06-25 ENCOUNTER — Ambulatory Visit
Admission: RE | Admit: 2012-06-25 | Discharge: 2012-06-25 | Disposition: A | Discharge status: Home / Self Care | Payer: 59 | Source: Ambulatory Visit | Attending: Physical Medicine and Rehabilitation | Admitting: Physical Medicine and Rehabilitation

## 2012-06-25 DIAGNOSIS — M47817 Spondylosis without myelopathy or radiculopathy, lumbosacral region: Secondary | ICD-10-CM

## 2012-06-26 ENCOUNTER — Telehealth: Payer: Self-pay | Admitting: Dietician

## 2012-06-27 ENCOUNTER — Telehealth (HOSPITAL_COMMUNITY): Payer: Self-pay | Admitting: Physical Medicine and Rehabilitation

## 2012-06-27 ENCOUNTER — Other Ambulatory Visit: Payer: Self-pay | Admitting: Oncology

## 2012-06-27 DIAGNOSIS — C539 Malignant neoplasm of cervix uteri, unspecified: Secondary | ICD-10-CM

## 2012-06-28 ENCOUNTER — Other Ambulatory Visit: Payer: Self-pay | Admitting: *Deleted

## 2012-06-28 ENCOUNTER — Telehealth: Payer: Self-pay | Admitting: *Deleted

## 2012-06-28 DIAGNOSIS — C539 Malignant neoplasm of cervix uteri, unspecified: Secondary | ICD-10-CM

## 2012-06-28 MED ORDER — HYDROMORPHONE HCL 4 MG PO TABS: ORAL_TABLET | ORAL | Status: DC

## 2012-06-28 NOTE — Telephone Encounter (Signed)
Called patient regarding refill request. States she is taking at least 8 tablets per day, is still using duragesic 100/72 hrs. Does not need refill before 5/13.

## 2012-06-30 ENCOUNTER — Other Ambulatory Visit: Payer: Self-pay | Admitting: Physical Medicine and Rehabilitation

## 2012-06-30 DIAGNOSIS — Z780 Asymptomatic menopausal state: Secondary | ICD-10-CM

## 2012-06-30 MED ORDER — HYDROMORPHONE HCL 2 MG PO TABS: 2.0000 mg | ORAL_TABLET | ORAL | Status: DC | PRN

## 2012-07-04 ENCOUNTER — Ambulatory Visit (HOSPITAL_COMMUNITY)
Admission: RE | Admit: 2012-07-04 | Discharge: 2012-07-04 | Disposition: A | Discharge status: Home / Self Care | Payer: 59 | Source: Ambulatory Visit | Attending: Oncology | Admitting: Oncology

## 2012-07-04 DIAGNOSIS — C771 Secondary and unspecified malignant neoplasm of intrathoracic lymph nodes: Secondary | ICD-10-CM | POA: Insufficient documentation

## 2012-07-04 DIAGNOSIS — Z923 Personal history of irradiation: Secondary | ICD-10-CM | POA: Insufficient documentation

## 2012-07-04 DIAGNOSIS — Z9089 Acquired absence of other organs: Secondary | ICD-10-CM | POA: Insufficient documentation

## 2012-07-04 DIAGNOSIS — I700 Atherosclerosis of aorta: Secondary | ICD-10-CM | POA: Insufficient documentation

## 2012-07-04 DIAGNOSIS — Z90710 Acquired absence of both cervix and uterus: Secondary | ICD-10-CM | POA: Insufficient documentation

## 2012-07-04 DIAGNOSIS — C787 Secondary malignant neoplasm of liver and intrahepatic bile duct: Secondary | ICD-10-CM | POA: Insufficient documentation

## 2012-07-04 DIAGNOSIS — M5137 Other intervertebral disc degeneration, lumbosacral region: Secondary | ICD-10-CM | POA: Insufficient documentation

## 2012-07-04 DIAGNOSIS — Z9221 Personal history of antineoplastic chemotherapy: Secondary | ICD-10-CM | POA: Insufficient documentation

## 2012-07-04 DIAGNOSIS — M51379 Other intervertebral disc degeneration, lumbosacral region without mention of lumbar back pain or lower extremity pain: Secondary | ICD-10-CM | POA: Insufficient documentation

## 2012-07-04 DIAGNOSIS — C539 Malignant neoplasm of cervix uteri, unspecified: Secondary | ICD-10-CM

## 2012-07-04 DIAGNOSIS — M439 Deforming dorsopathy, unspecified: Secondary | ICD-10-CM | POA: Insufficient documentation

## 2012-07-04 MED ORDER — IOHEXOL 300 MG/ML  SOLN
100.0000 mL | Freq: Once | INTRAMUSCULAR | Status: AC | PRN
  Administered 2012-07-04: 100 mL via INTRAVENOUS

## 2012-07-06 ENCOUNTER — Telehealth: Payer: Self-pay | Admitting: *Deleted

## 2012-07-06 NOTE — Telephone Encounter (Signed)
Message copied by Phillis Knack on Thu Jul 06, 2012 11:56 AM ------      Message from: Lorine Bears      Created: Wed Jul 05, 2012  6:04 PM                   ----- Message -----         From: Reece Packer, MD         Sent: 07/04/2012   4:25 PM           To: Lorine Bears, RN            Labs seen and need follow up: please let her know that the retroperitoneal lymph nodes are no larger/ actually some better, so I do not think these are causing the increased back pain -- the back pain still appears to be from the vertebral compression fractures; hopefully she has seen Dr Ethelene Hal and he has some suggestions.  The right kidney still looks fine. The areas in the liver are a little larger, but I do not think these are causing pain. I will discuss all of this with her at visit on 07-11-12, but wanted to let her know this information now. ------

## 2012-07-06 NOTE — Telephone Encounter (Signed)
Message copied by Phillis Knack on Thu Jul 06, 2012 12:27 PM ------      Message from: Lorine Bears      Created: Wed Jul 05, 2012  6:04 PM                   ----- Message -----         From: Reece Packer, MD         Sent: 07/04/2012   4:25 PM           To: Lorine Bears, RN            Labs seen and need follow up: please let her know that the retroperitoneal lymph nodes are no larger/ actually some better, so I do not think these are causing the increased back pain -- the back pain still appears to be from the vertebral compression fractures; hopefully she has seen Dr Ethelene Hal and he has some suggestions.  The right kidney still looks fine. The areas in the liver are a little larger, but I do not think these are causing pain. I will discuss all of this with her at visit on 07-11-12, but wanted to let her know this information now. ------

## 2012-07-06 NOTE — Telephone Encounter (Signed)
Called patient and let her know results below. She has seen Dr Ethelene Hal- she has 2 new compression fractures. She has opted to NOT have another surgery at this time to cement the vertebra. To see Dr Ethelene Hal again at the end of the month.

## 2012-07-06 NOTE — Telephone Encounter (Signed)
Tried all 3 phone numbers, no answer. Left message on home machine to call Dr Precious Reel Nurse

## 2012-07-11 ENCOUNTER — Encounter: Payer: Self-pay | Admitting: Oncology

## 2012-07-11 ENCOUNTER — Other Ambulatory Visit (HOSPITAL_BASED_OUTPATIENT_CLINIC_OR_DEPARTMENT_OTHER): Payer: 59 | Admitting: Lab

## 2012-07-11 ENCOUNTER — Other Ambulatory Visit: Payer: Self-pay

## 2012-07-11 ENCOUNTER — Ambulatory Visit: Payer: 59

## 2012-07-11 ENCOUNTER — Ambulatory Visit (HOSPITAL_BASED_OUTPATIENT_CLINIC_OR_DEPARTMENT_OTHER): Payer: 59 | Admitting: Oncology

## 2012-07-11 ENCOUNTER — Other Ambulatory Visit: Payer: 59 | Admitting: Lab

## 2012-07-11 VITALS — BP 97/71 | HR 94 | Temp 97.3°F | Resp 18 | Ht 61.5 in | Wt 186.3 lb

## 2012-07-11 DIAGNOSIS — C787 Secondary malignant neoplasm of liver and intrahepatic bile duct: Secondary | ICD-10-CM

## 2012-07-11 DIAGNOSIS — M810 Age-related osteoporosis without current pathological fracture: Secondary | ICD-10-CM | POA: Insufficient documentation

## 2012-07-11 DIAGNOSIS — C539 Malignant neoplasm of cervix uteri, unspecified: Secondary | ICD-10-CM

## 2012-07-11 DIAGNOSIS — C772 Secondary and unspecified malignant neoplasm of intra-abdominal lymph nodes: Secondary | ICD-10-CM

## 2012-07-11 LAB — CBC WITH DIFFERENTIAL/PLATELET
Basophils Absolute: 0 10*3/uL (ref 0.0–0.1)
Eosinophils Absolute: 0.1 10*3/uL (ref 0.0–0.5)
HCT: 31.2 % — ABNORMAL LOW (ref 34.8–46.6)
HGB: 10.3 g/dL — ABNORMAL LOW (ref 11.6–15.9)
LYMPH%: 15.4 % (ref 14.0–49.7)
MCV: 82.7 fL (ref 79.5–101.0)
MONO#: 0.6 10*3/uL (ref 0.1–0.9)
MONO%: 11.1 % (ref 0.0–14.0)
NEUT#: 3.9 10*3/uL (ref 1.5–6.5)
NEUT%: 70.6 % (ref 38.4–76.8)
Platelets: 265 10*3/uL (ref 145–400)
RBC: 3.77 10*6/uL (ref 3.70–5.45)
WBC: 5.6 10*3/uL (ref 3.9–10.3)

## 2012-07-11 LAB — COMPREHENSIVE METABOLIC PANEL (CC13)
Alkaline Phosphatase: 214 U/L — ABNORMAL HIGH (ref 40–150)
BUN: 14.2 mg/dL (ref 7.0–26.0)
CO2: 24 mEq/L (ref 22–29)
Glucose: 95 mg/dl (ref 70–99)
Total Bilirubin: 0.4 mg/dL (ref 0.20–1.20)
Total Protein: 6.9 g/dL (ref 6.4–8.3)

## 2012-07-11 MED ORDER — FOLIC ACID 1 MG PO TABS: 1.0000 mg | ORAL_TABLET | Freq: Every day | ORAL | Status: DC

## 2012-07-11 MED ORDER — HEPARIN SOD (PORK) LOCK FLUSH 100 UNIT/ML IV SOLN
500.0000 [IU] | Freq: Once | INTRAVENOUS | Status: AC
  Administered 2012-07-11: 500 [IU] via INTRAVENOUS
  Filled 2012-07-11: qty 5

## 2012-07-11 MED ORDER — SODIUM CHLORIDE 0.9 % IJ SOLN
10.0000 mL | INTRAMUSCULAR | Status: DC | PRN
  Administered 2012-07-11: 10 mL via INTRAVENOUS
  Filled 2012-07-11: qty 10

## 2012-07-11 MED ORDER — FENTANYL 100 MCG/HR TD PT72: 1.0000 | MEDICATED_PATCH | TRANSDERMAL | Status: DC

## 2012-07-11 MED ORDER — CYANOCOBALAMIN 1000 MCG/ML IJ SOLN
1000.0000 ug | Freq: Once | INTRAMUSCULAR | Status: AC
  Administered 2012-07-11: 1000 ug via INTRAMUSCULAR

## 2012-07-11 MED ORDER — DENOSUMAB 60 MG/ML ~~LOC~~ SOLN
60.0000 mg | Freq: Once | SUBCUTANEOUS | Status: AC
  Administered 2012-07-11: 60 mg via SUBCUTANEOUS
  Filled 2012-07-11: qty 1

## 2012-07-11 MED ORDER — FENTANYL 25 MCG/HR TD PT72: MEDICATED_PATCH | TRANSDERMAL | Status: DC

## 2012-07-11 MED ORDER — HYDROMORPHONE HCL 4 MG PO TABS: ORAL_TABLET | ORAL | Status: DC

## 2012-07-11 MED ORDER — DEXAMETHASONE 4 MG PO TABS: ORAL_TABLET | ORAL | Status: DC

## 2012-07-11 NOTE — Progress Notes (Signed)
OFFICE PROGRESS NOTE   07/11/2012   Physicians:J.Kinard, V.Leschber, D.ClarkePearson, J.Allred, D.Margarita Grizzle, H.Derrell Lolling, Kathie Rhodes.Deveshwar (IR), Dr R.Ramos   INTERVAL HISTORY:   Patient is seen, together with significant other, in continuing attention to her metastatic cervical cancer, with restaging CT AP 07-04-12 showing progression in liver with stable to improved periaortic adenopathy and no other new metastatic disease. Situation has been complicated recently by four osteoporotic lumbar vertebral compression fractures, for which she has had vertebroplasties by Dr Corliss Skains and is now under care of Dr Ethelene Hal.  Last chemotherapy was low dose carbo taxol thru late Jan 2014. She tends to have significant cytopenias with chemotherapy. She continues xarelto for LE DVT.   She has PAC in, flushed today  Oncologic History  Patient had radical hysterectomy, bilateral salpingo-oophorectomy and pelvic lymphadenectomy in March of 2005 by Dr Ronita Hipps for a stage IB cervical cancer. Final pathology showed no residual disease and all lymph nodes were negative. The patient was followed for 5 years with no evidence of disease until she developed a recurrence of the right pelvic sidewall in February of 2010. This caused obstruction of the right ureter. She was treated with radiation therapy and concurrent cisplatin chemotherapy. In late 2011 she had recurrence in right inguinal lymph node and left pelvic nodes and received additional radiation therapy to those areas, completed in January 2012. In August 2012 she had a 2.8 cm liver lesion and received 6 cycles of Taxol + one cycle of carboplatin. She had a partial response to chemo, then increase in the solitary liver lesion and RFA was performed on 10/07/2011. 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 After ~ 3 months she was  changed to xarelto by Dr Felicity Coyer because of local bruising and discomfort from the lovenox injections; she continues xarelto now. She had progression in left periaortic nodes which was symptomatic with pain and treated by Dr Roselind Messier with 5040 cGY in 28 fractions from 10-11-11 thru 11-19-11. Pain has improved since radiation and she has been able to decrease pain medication to present oycontin 20 mg q 12 hrs with prn percocet. CT CAP repeated 12-31-11 found multiple new liver lesions, small left inguinal nodes and a 5 cm fluid collection right groin. Chemotherapy was resumed with low dose carboplatin and taxol on 02-01-12, three cycles thru 03-28-12, complicated by cytopenias despite decreased doses. Restaging CT AP 04-07-12 unfortunately showed increase in size of most of the liver mets, tho no new disease elsewhere.   Pain from the lumbar compression fractures is better controlled with duragesic and prn dilaudid (4 mg tablets, using ~ 5 in 24 hours); by third day on duragesic she has to stay basically in bed with pain. We will increase duragesic to 125 mcg every 72 hrs with same prn dilaudid. Appetite is poor tho she drinks fluids (water and green tea) and likes fruit; I have recommended Boost Breeze or Resource. Bowels are moving. She has had no bleeding. She denies fever, other symptoms of infection, SOB or cough. She has some pain RUQ intermittently. Remainder of 10 point Review of Systems negative/ unchanged.  Objective:  Vital signs in last 24 hours:  BP 97/71  Pulse 94  Temp(Src) 97.3 F (36.3 C) (Oral)  Resp 18  Ht 5' 1.5" (1.562 m)  Wt 186 lb 4.8 oz (84.505 kg)  BMI 34.64 kg/m2 Weight is down 11  lbs from 06-14-12. Ambulatory slowly, looks more comfortable seated in exam room today. Respirations not labored RA.   HEENT:PERRLA, extra ocular movement intact, sclera clear, anicteric and oropharynx clear, no lesions LymphaticsCervical, supraclavicular, and axillary nodes normal. Resp: clear to  auscultation bilaterally Cardio: irregularly irregular rhythm GI: soft, quiet, not distended, not tender. Full RUQ on exam done just in chair, no rub Extremities: no pitting edema or cords LE Neuro:no sensory deficits noted Skin without rash or ecchymosis Portacath-without erythema or tenderness  Lab Results:  Results for orders placed in visit on 07/11/12  CBC WITH DIFFERENTIAL      Result Value Range   WBC 5.6  3.9 - 10.3 10e3/uL   NEUT# 3.9  1.5 - 6.5 10e3/uL   HGB 10.3 (*) 11.6 - 15.9 g/dL   HCT 46.9 (*) 62.9 - 52.8 %   Platelets 265  145 - 400 10e3/uL   MCV 82.7  79.5 - 101.0 fL   MCH 27.4  25.1 - 34.0 pg   MCHC 33.1  31.5 - 36.0 g/dL   RBC 4.13  2.44 - 0.10 10e6/uL   RDW 14.6 (*) 11.2 - 14.5 %   lymph# 0.9  0.9 - 3.3 10e3/uL   MONO# 0.6  0.1 - 0.9 10e3/uL   Eosinophils Absolute 0.1  0.0 - 0.5 10e3/uL   Basophils Absolute 0.0  0.0 - 0.1 10e3/uL   NEUT% 70.6  38.4 - 76.8 %   LYMPH% 15.4  14.0 - 49.7 %   MONO% 11.1  0.0 - 14.0 %   EOS% 2.2  0.0 - 7.0 %   BASO% 0.7  0.0 - 2.0 %  COMPREHENSIVE METABOLIC PANEL (CC13)      Result Value Range   Sodium 137  136 - 145 mEq/L   Potassium 4.4  3.5 - 5.1 mEq/L   Chloride 104  98 - 107 mEq/L   CO2 24  22 - 29 mEq/L   Glucose 95  70 - 99 mg/dl   BUN 27.2  7.0 - 53.6 mg/dL   Creatinine 1.1  0.6 - 1.1 mg/dL   Total Bilirubin 6.44  0.20 - 1.20 mg/dL   Alkaline Phosphatase 214 (*) 40 - 150 U/L   AST 18  5 - 34 U/L   ALT 18  0 - 55 U/L   Total Protein 6.9  6.4 - 8.3 g/dL   Albumin 3.2 (*) 3.5 - 5.0 g/dL   Calcium 9.5  8.4 - 03.4 mg/dL     Studies/Results: CT ABDOMEN AND PELVIS WITH CONTRAST  Technique: Multidetector CT imaging of the abdomen and pelvis was  performed following the standard protocol during bolus  administration of intravenous contrast.  Contrast: OMNIPAQUE IOHEXOL 300 MG/ML SOLN  Comparison: 04/07/2012  Findings: The lung bases appear clear. No pleural or pericardial  effusion identified.  Multi focal  metastatic lesions are noted within the liver. Index  lesion within the dome of liver measures 3.3 cm, image 15/series 2.  Previously 2.4 cm. The right hepatic lobe index lesion measures  4.4 cm, image 21/series 2. Previously 2.6 cm. The posterior right  hepatic lobe index lesion measures 3.9 cm, image 29/series 2.  Previously 3.3 cm. Previous cholecystectomy. No significant  biliary dilatation. The pancreas is unremarkable. The spleen is  normal.  Normal appearance of the adrenal glands. Atrophied left kidney is  again noted.  Essentially normal appearance of the right kidney. Urinary bladder  appears normal for degree of distention. There are postoperative  changes from  hysterectomy. IVC filter is in place. Calcified  atherosclerotic change affects the abdominal aorta. There is no  aneurysm. The periaortic lymph node measures 1.1 cm, image  33/series 2. Previously 1.3 cm. Peripherally enhancing fluid  attenuation structure within the left external iliac region is  identified measuring 2.2 x 3.1 cm, image 73/series 2. This is  stable from previous exam.  No free fluid identified within the abdomen or pelvis. There is no  peritoneal nodule or mass identified. The stomach appears normal.  The small bowel loops are unremarkable. Normal appearance of the  appendix. The colon is unremarkable.  Multifocal degenerative disc disease is noted within the lumbar  spine. Interval compression deformities of L1, L2 and L4 vertebra.  Vertebroplasty has been performed at L1 and L3. No aggressive  lytic or sclerotic bone lesions.  IMPRESSION:  1. Progression of multifocal liver metastases.  2. Stable to improved periaortic lymph node metastasis.  3. No change in small peripherally enhancing fluid attenuation  structure within the distal left external iliac lymph node chain.  4. Lumbar compression deformities as described above.  We have discussed results of CT as above, including progression of  previous metastatic lesions in liver.   I have LM for Dr Ethelene Hal that we will begin Prolia for osteoporotic compression fractures; if no change in treatment otherwise expected with bone density scan scheduled, may not need this now.  Medications: I have reviewed the patient's current medications. Increase duragesic to 125 micrograms q 72 hrs.  We have discussed using Prolia q 6 months for osteoporosis, which she would like to begin now, and which has been approved today thru her insurance. She will increase usual calcium with D to BID today and next couple of days with the Prolia.  We have discussed trying Alimta for the metastatic cervical cancer, which has shown some response in this disease tho would also need to be approved by her insurance. She has had teaching for alimta by RN today, has had B12 injection and will begin folate 1 mg daily. Decadron 4 mg bid x 3 days beginning day prior to be used with q 3 week Alimta.  Assessment/Plan:  1.Metastatic cervical cancer to liver, inguinal and periaortic nodes: progression in majority of liver mets by repeat CT now. Patient is in favor of Alimta x 2-3 cycles prior to repeat CT liver, if insurance approves. We will try to begin Alimta in next 1-3 weeks and she should be seen back ~ a week after first treatment with counts. 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 Lumbar compression fractures post kyphoplasties with path negative for malignancy. 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 Prolia given today and will use q 6 months.   Chemo orders for cycle 1 alimta done now for approximate date June 3 so that prior authorization request can be sent to financial staff. If insurance denies, consider discussing with gyn oncology or  visit back to Dr Yolande Jolly for other recommendations.  Reece Packer, MD   07/11/2012, 4:56 PM

## 2012-07-11 NOTE — Progress Notes (Signed)
Reviewed written information given to [name] regarding Prolia injections.  Consent form signed and sent to HIM to be scanned into patient's EMR.

## 2012-07-11 NOTE — Patient Instructions (Signed)
Resource or Boost Breeze    Juice-like supplements.  3-4 daily would be great nutrition

## 2012-07-12 ENCOUNTER — Telehealth: Payer: Self-pay

## 2012-07-12 NOTE — Telephone Encounter (Signed)
[name] from Dr. Ethelene Hal' offfice called to let Dr. Darrold Span know that it is fine with him to hold off on the Bone density scan.  Told [name] and she offered to cancel the appointment at the [facility] for 07-25-12. [name] will call the scheduling department on Tuesday if she has not heard about her appointments. Gave copy of POF from 07-11-12  to Dory Peru in scheduling as the orders did not cross over into the scheduling screen.

## 2012-07-13 ENCOUNTER — Other Ambulatory Visit: Payer: Self-pay | Admitting: Oncology

## 2012-07-17 ENCOUNTER — Telehealth: Payer: Self-pay | Admitting: Oncology

## 2012-07-17 ENCOUNTER — Telehealth: Payer: Self-pay | Admitting: *Deleted

## 2012-07-17 NOTE — Telephone Encounter (Signed)
Per staff phone call and POF I have schedueld appts.  JMW  

## 2012-07-17 NOTE — Telephone Encounter (Signed)
s.w. pt and advised on may appt.....pt ok and aware °

## 2012-07-19 ENCOUNTER — Other Ambulatory Visit: Payer: Self-pay

## 2012-07-19 MED ORDER — METOPROLOL TARTRATE 50 MG PO TABS: 50.0000 mg | ORAL_TABLET | Freq: Two times a day (BID) | ORAL | Status: DC

## 2012-07-19 NOTE — Telephone Encounter (Signed)
metoprolol (LOPRESSOR) 50 MG tablet  Take 1 tablet (50 mg total) by mouth 2 (two) times daily.   60 tablet    Patient Instructions  Your physician wants you to follow-up in: 12 months with Dr Johney Frame  Bonita Quin will receive a reminder letter in the mail two months in advance. If you don't receive a letter, please call our office to schedule the follow-up appointment.    Patient Instructions History Recorded     Previous Visit     Provider Department Encounter #  05/16/2012 11:02 AM Hillis Range, MD [facility] [id number]

## 2012-07-21 ENCOUNTER — Ambulatory Visit (HOSPITAL_BASED_OUTPATIENT_CLINIC_OR_DEPARTMENT_OTHER): Payer: 59

## 2012-07-21 ENCOUNTER — Other Ambulatory Visit (HOSPITAL_BASED_OUTPATIENT_CLINIC_OR_DEPARTMENT_OTHER): Payer: 59 | Admitting: Lab

## 2012-07-21 DIAGNOSIS — C539 Malignant neoplasm of cervix uteri, unspecified: Secondary | ICD-10-CM

## 2012-07-21 DIAGNOSIS — Z5111 Encounter for antineoplastic chemotherapy: Secondary | ICD-10-CM

## 2012-07-21 LAB — CBC WITH DIFFERENTIAL/PLATELET
Basophils Absolute: 0 10*3/uL (ref 0.0–0.1)
Eosinophils Absolute: 0 10*3/uL (ref 0.0–0.5)
HGB: 11.1 g/dL — ABNORMAL LOW (ref 11.6–15.9)
LYMPH%: 9.9 % — ABNORMAL LOW (ref 14.0–49.7)
MCV: 83.3 fL (ref 79.5–101.0)
MONO#: 0.3 10*3/uL (ref 0.1–0.9)
MONO%: 5.2 % (ref 0.0–14.0)
NEUT#: 4.2 10*3/uL (ref 1.5–6.5)
Platelets: 300 10*3/uL (ref 145–400)
RBC: 4.12 10*6/uL (ref 3.70–5.45)
RDW: 14.5 % (ref 11.2–14.5)
WBC: 5 10*3/uL (ref 3.9–10.3)

## 2012-07-21 LAB — COMPREHENSIVE METABOLIC PANEL (CC13)
Albumin: 3.2 g/dL — ABNORMAL LOW (ref 3.5–5.0)
BUN: 12.8 mg/dL (ref 7.0–26.0)
CO2: 21 mEq/L — ABNORMAL LOW (ref 22–29)
Glucose: 151 mg/dl — ABNORMAL HIGH (ref 70–99)
Potassium: 4.7 mEq/L (ref 3.5–5.1)
Sodium: 135 mEq/L — ABNORMAL LOW (ref 136–145)
Total Protein: 7.1 g/dL (ref 6.4–8.3)

## 2012-07-21 MED ORDER — ONDANSETRON 8 MG/50ML IVPB (CHCC)
8.0000 mg | Freq: Once | INTRAVENOUS | Status: AC
  Administered 2012-07-21: 8 mg via INTRAVENOUS

## 2012-07-21 MED ORDER — HEPARIN SOD (PORK) LOCK FLUSH 100 UNIT/ML IV SOLN
500.0000 [IU] | Freq: Once | INTRAVENOUS | Status: AC | PRN
  Administered 2012-07-21: 500 [IU]
  Filled 2012-07-21: qty 5

## 2012-07-21 MED ORDER — PEMETREXED DISODIUM CHEMO INJECTION 500 MG
500.0000 mg/m2 | Freq: Once | INTRAVENOUS | Status: AC
  Administered 2012-07-21: 950 mg via INTRAVENOUS
  Filled 2012-07-21: qty 38

## 2012-07-21 MED ORDER — DEXAMETHASONE SODIUM PHOSPHATE 10 MG/ML IJ SOLN
10.0000 mg | Freq: Once | INTRAMUSCULAR | Status: AC
  Administered 2012-07-21: 10 mg via INTRAVENOUS

## 2012-07-21 MED ORDER — SODIUM CHLORIDE 0.9 % IV SOLN
Freq: Once | INTRAVENOUS | Status: AC
  Administered 2012-07-21: 13:00:00 via INTRAVENOUS

## 2012-07-21 MED ORDER — SODIUM CHLORIDE 0.9 % IJ SOLN
10.0000 mL | INTRAMUSCULAR | Status: DC | PRN
  Administered 2012-07-21: 10 mL
  Filled 2012-07-21: qty 10

## 2012-07-21 NOTE — Patient Instructions (Addendum)
[facility] Discharge Instructions for Patients Receiving Chemotherapy  Today you received the following chemotherapy agents Alimta  To help prevent nausea and vomiting after your treatment, we encourage you to take your nausea medication Zofran Begin taking it at 8pm and take it as often as prescribed for the next 24-36 hours.   If you develop nausea and vomiting that is not controlled by your nausea medication, call the clinic. If it is after clinic hours your family physician or the after hours number for the clinic or go to the Emergency Department.   BELOW ARE SYMPTOMS THAT SHOULD BE REPORTED IMMEDIATELY:  *FEVER GREATER THAN 100.5 F  *CHILLS WITH OR WITHOUT FEVER  NAUSEA AND VOMITING THAT IS NOT CONTROLLED WITH YOUR NAUSEA MEDICATION  *UNUSUAL SHORTNESS OF BREATH  *UNUSUAL BRUISING OR BLEEDING  TENDERNESS IN MOUTH AND THROAT WITH OR WITHOUT PRESENCE OF ULCERS  *URINARY PROBLEMS  *BOWEL PROBLEMS  UNUSUAL RASH Items with * indicate a potential emergency and should be followed up as soon as possible.  One of the nurses will contact you 24 hours after your treatment. Please let the nurse know about any problems that you may have experienced. Feel free to call the clinic you have any questions or concerns. The clinic phone number is [phone].   I have been informed and understand all the instructions given to me. I know to contact the clinic, my physician, or go to the Emergency Department if any problems should occur. I do not have any questions at this time, but understand that I may call the clinic during office hours   should I have any questions or need assistance in obtaining follow up care.  Pemetrexed injection (Alimta) What is this medicine? PEMETREXED (PEM e TREX ed) is a chemotherapy drug. This medicine affects cells that are rapidly growing, such as cancer cells and cells in your mouth and stomach. It is usually used to treat lung cancers like  non-small cell lung cancer and mesothelioma. It may also be used to treat other cancers. This medicine may be used for other purposes; ask your health care provider or pharmacist if you have questions. What should I tell my health care provider before I take this medicine? They need to know if you have any of these conditions: -if you frequently drink alcohol containing beverages -infection (especially a virus infection such as chickenpox, cold sores, or herpes) -kidney disease -liver disease -low blood counts, like low platelets, red bloods, or white blood cells -an unusual or allergic reaction to pemetrexed, mannitol, other medicines, foods, dyes, or preservatives -pregnant or trying to get pregnant -breast-feeding How should I use this medicine? This drug is given as an infusion into a vein. It is administered in a hospital or clinic by a specially trained health care professional. Talk to your pediatrician regarding the use of this medicine in children. Special care may be needed. Overdosage: If you think you have taken too much of this medicine contact a poison control center or emergency room at once. NOTE: This medicine is only for you. Do not share this medicine with others. What if I miss a dose? It is important not to miss your dose. Call your doctor or health care professional if you are unable to keep an appointment. What may interact with this medicine? -aspirin and aspirin-like medicines -medicines to increase blood counts like filgrastim, pegfilgrastim, sargramostim -methotrexate -NSAIDS, medicines for pain and inflammation, like ibuprofen or naproxen -probenecid -pyrimethamine -vaccines Talk to your  doctor or health care professional before taking any of these medicines: -acetaminophen -aspirin -ibuprofen -ketoprofen -naproxen This list may not describe all possible interactions. Give your health care provider a list of all the medicines, herbs, non-prescription  drugs, or dietary supplements you use. Also tell them if you smoke, drink alcohol, or use illegal drugs. Some items may interact with your medicine. What should I watch for while using this medicine? Visit your doctor for checks on your progress. This drug may make you feel generally unwell. This is not uncommon, as chemotherapy can affect healthy cells as well as cancer cells. Report any side effects. Continue your course of treatment even though you feel ill unless your doctor tells you to stop. In some cases, you may be given additional medicines to help with side effects. Follow all directions for their use. Call your doctor or health care professional for advice if you get a fever, chills or sore throat, or other symptoms of a cold or flu. Do not treat yourself. This drug decreases your body's ability to fight infections. Try to avoid being around people who are sick. This medicine may increase your risk to bruise or bleed. Call your doctor or health care professional if you notice any unusual bleeding. Be careful brushing and flossing your teeth or using a toothpick because you may get an infection or bleed more easily. If you have any dental work done, tell your dentist you are receiving this medicine. Avoid taking products that contain aspirin, acetaminophen, ibuprofen, naproxen, or ketoprofen unless instructed by your doctor. These medicines may hide a fever. Call your doctor or health care professional if you get diarrhea or mouth sores. Do not treat yourself. To protect your kidneys, drink water or other fluids as directed while you are taking this medicine. Men and women must use effective birth control while taking this medicine. You may also need to continue using effective birth control for a time after stopping this medicine. Do not become pregnant while taking this medicine. Tell your doctor right away if you think that you or your partner might be pregnant. There is a potential for  serious side effects to an unborn child. Talk to your health care professional or pharmacist for more information. Do not breast-feed an infant while taking this medicine. This medicine may lower sperm counts. What side effects may I notice from receiving this medicine? Side effects that you should report to your doctor or health care professional as soon as possible: -allergic reactions like skin rash, itching or hives, swelling of the face, lips, or tongue -low blood counts - this medicine may decrease the number of white blood cells, red blood cells and platelets. You may be at increased risk for infections and bleeding. -signs of infection - fever or chills, cough, sore throat, pain or difficulty passing urine -signs of decreased platelets or bleeding - bruising, pinpoint red spots on the skin, black, tarry stools, blood in the urine -signs of decreased red blood cells - unusually weak or tired, fainting spells, lightheadedness -breathing problems, like a dry cough -changes in emotions or moods -chest pain -confusion -diarrhea -high blood pressure -mouth or throat sores or ulcers -pain, swelling, warmth in the leg -pain on swallowing -swelling of the ankles, feet, hands -trouble passing urine or change in the amount of urine -vomiting -yellowing of the eyes or skin Side effects that usually do not require medical attention (report to your doctor or health care professional if they continue or are bothersome): -  hair loss -loss of appetite -nausea -stomach upset This list may not describe all possible side effects. Call your doctor for medical advice about side effects. You may report side effects to FDA at 1-800-FDA-1088. Where should I keep my medicine? This drug is given in a hospital or clinic and will not be stored at home. NOTE: This sheet is a summary. It may not cover all possible information. If you have questions about this medicine, talk to your doctor, pharmacist, or health  care provider.  2012, [employer]. (09/19/2007 1:24:03 PM)

## 2012-07-25 ENCOUNTER — Telehealth: Payer: Self-pay | Admitting: *Deleted

## 2012-07-25 ENCOUNTER — Other Ambulatory Visit: Payer: 59

## 2012-07-25 NOTE — Telephone Encounter (Signed)
Message copied by Augusto Garbe on Tue Jul 25, 2012  3:55 PM ------      Message from: Kallie Locks      Created: Fri Jul 21, 2012  1:53 PM      Regarding: "1st time Chemotherapy"      Contact: [phone]       Patient received 1st time Alimta, per Dr. Darrold Span; tolerated treatment well. ------

## 2012-07-25 NOTE — Telephone Encounter (Signed)
Message left requesting return call for chemotherapy f/u.

## 2012-07-28 ENCOUNTER — Ambulatory Visit (HOSPITAL_BASED_OUTPATIENT_CLINIC_OR_DEPARTMENT_OTHER): Payer: 59 | Admitting: Oncology

## 2012-07-28 ENCOUNTER — Encounter: Payer: Self-pay | Admitting: Oncology

## 2012-07-28 ENCOUNTER — Other Ambulatory Visit (HOSPITAL_BASED_OUTPATIENT_CLINIC_OR_DEPARTMENT_OTHER): Payer: 59 | Admitting: Lab

## 2012-07-28 VITALS — BP 113/77 | HR 136 | Temp 98.3°F | Resp 18 | Ht 61.0 in

## 2012-07-28 DIAGNOSIS — C539 Malignant neoplasm of cervix uteri, unspecified: Secondary | ICD-10-CM

## 2012-07-28 DIAGNOSIS — R110 Nausea: Secondary | ICD-10-CM

## 2012-07-28 DIAGNOSIS — Z86718 Personal history of other venous thrombosis and embolism: Secondary | ICD-10-CM

## 2012-07-28 DIAGNOSIS — C772 Secondary and unspecified malignant neoplasm of intra-abdominal lymph nodes: Secondary | ICD-10-CM

## 2012-07-28 DIAGNOSIS — C787 Secondary malignant neoplasm of liver and intrahepatic bile duct: Secondary | ICD-10-CM

## 2012-07-28 DIAGNOSIS — Z95828 Presence of other vascular implants and grafts: Secondary | ICD-10-CM

## 2012-07-28 LAB — CBC WITH DIFFERENTIAL/PLATELET
Basophils Absolute: 0 10*3/uL (ref 0.0–0.1)
Eosinophils Absolute: 0 10*3/uL (ref 0.0–0.5)
HGB: 10.8 g/dL — ABNORMAL LOW (ref 11.6–15.9)
MONO#: 0.1 10*3/uL (ref 0.1–0.9)
NEUT#: 1.6 10*3/uL (ref 1.5–6.5)
RBC: 4.03 10*6/uL (ref 3.70–5.45)
RDW: 15.2 % — ABNORMAL HIGH (ref 11.2–14.5)
WBC: 2 10*3/uL — ABNORMAL LOW (ref 3.9–10.3)
lymph#: 0.3 10*3/uL — ABNORMAL LOW (ref 0.9–3.3)

## 2012-07-28 MED ORDER — HEPARIN SOD (PORK) LOCK FLUSH 100 UNIT/ML IV SOLN
500.0000 [IU] | Freq: Once | INTRAVENOUS | Status: AC
  Administered 2012-07-28: 500 [IU] via INTRAVENOUS
  Filled 2012-07-28: qty 5

## 2012-07-28 MED ORDER — FILGRASTIM 300 MCG/0.5ML IJ SOLN
300.0000 ug | Freq: Once | INTRAMUSCULAR | Status: AC
Start: 2012-07-28 — End: 2012-07-28
  Administered 2012-07-28: 300 ug via SUBCUTANEOUS
  Filled 2012-07-28: qty 0.5

## 2012-07-28 MED ORDER — SODIUM CHLORIDE 0.9 % IV SOLN
INTRAVENOUS | Status: DC
  Administered 2012-07-28: 15:00:00 via INTRAVENOUS

## 2012-07-28 MED ORDER — SODIUM CHLORIDE 0.9 % IJ SOLN
10.0000 mL | INTRAMUSCULAR | Status: DC | PRN
  Administered 2012-07-28: 10 mL via INTRAVENOUS
  Filled 2012-07-28: qty 10

## 2012-07-28 MED ORDER — ONDANSETRON 8 MG/50ML IVPB (CHCC)
8.0000 mg | Freq: Once | INTRAVENOUS | Status: AC
  Administered 2012-07-28: 8 mg via INTRAVENOUS

## 2012-07-28 NOTE — Progress Notes (Signed)
OFFICE PROGRESS NOTE   07/28/2012   Physicians:J.Kinard, V.Leschber, D.ClarkePearson, J.Allred, D.Margarita Grizzle, H.Derrell Lolling, Kathie Rhodes.Deveshwar (IR), Dr R.Ramos   INTERVAL HISTORY:   Patient is seen, together with significant other, in continuing attention to metastatic cervical cancer, having had first Alimta on 07-21-12. She has felt badly since that treatment, with weakness, nausea, poor po intake. Oncologic History  Patient had radical hysterectomy, bilateral salpingo-oophorectomy and pelvic lymphadenectomy in March of 2005 by Dr Ronita Hipps for a stage IB cervical cancer. Final pathology showed no residual disease and all lymph nodes were negative. The patient was followed for 5 years with no evidence of disease until she developed a recurrence of the right pelvic sidewall in February of 2010. This caused obstruction of the right ureter. She was treated with radiation therapy and concurrent cisplatin chemotherapy. In late 2011 she had recurrence in right inguinal lymph node and left pelvic nodes and received additional radiation therapy to those areas, completed in January 2012. In August 2012 she had a 2.8 cm liver lesion and received 6 cycles of Taxol + one cycle of carboplatin. She had a partial response to chemo, then increase in the solitary liver lesion and RFA was performed on 10/07/2011. 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 After ~ 3 months she was changed to xarelto by Dr Felicity Coyer because of local bruising and discomfort from the lovenox injections; she continues xarelto now. She had progression in left periaortic nodes which was symptomatic with pain and treated by Dr Roselind Messier with 5040 cGY in 28 fractions from 10-11-11 thru 11-19-11. Pain has improved since radiation and she has been able to decrease pain medication to present oycontin 20 mg q 12 hrs with prn  percocet. CT CAP repeated 12-31-11 found multiple new liver lesions, small left inguinal nodes and a 5 cm fluid collection right groin. Chemotherapy was resumed with low dose carboplatin and taxol on 02-01-12, three cycles thru 03-28-12, complicated by cytopenias despite decreased doses. Restaging CT AP 04-07-12 unfortunately showed increase in size of most of the liver mets, tho no new disease elsewhere.   Bad chemo taste. No bleeding. No fever. No cough or chest pain. Bowels not moving well. Decreased swelling LE. No problems with PAC. Puritic rash in scalp past few days. Remainder of 10 point Review of Systems negative.  Objective:  Vital signs in last 24 hours:  BP 113/77  Pulse 136  Temp(Src) 98.3 F (36.8 C) (Oral)  Resp 18  Ht 5\' 1"  (1.549 m) Did not stand to weigh today. Awake, alert, looks uncomfortable and weak seated in WC. Respirations not labored RA.   HEENT:PERRLA, sclera clear, anicteric and oral mucosa dry Partial alopecia. LymphaticsCervical, supraclavicular, and axillary nodes normal. Resp: clear to auscultation bilaterally Cardio: regular rate and rhythm  tachy GI: soft, quiet, not tender. Hepatomegaly, no rub Extremities: no pitting edema LE, not tender Neuro:nonfocal on exam just in [facility] Skin pale, not icteric. Erythematous rash at hairline and scattered in scalp. Portacath-without erythema or tenderness  Lab Results:  Results for orders placed in visit on 07/28/12  CBC WITH DIFFERENTIAL      Result Value Range   WBC 2.0 (*) 3.9 - 10.3 10e3/uL   NEUT# 1.6  1.5 - 6.5 10e3/uL   HGB 10.8 (*) 11.6 - 15.9 g/dL   HCT 14.7 (*) 82.9 - 56.2 %   Platelets 169  145 - 400 10e3/uL   MCV 80.3  79.5 - 101.0 fL   MCH 26.9  25.1 - 34.0 pg   MCHC 33.5  31.5 - 36.0 g/dL   RBC 1.61  0.96 - 0.45 10e6/uL   RDW 15.2 (*) 11.2 - 14.5 %   lymph# 0.3 (*) 0.9 - 3.3 10e3/uL   MONO# 0.1  0.1 - 0.9 10e3/uL   Eosinophils Absolute 0.0  0.0 - 0.5 10e3/uL   Basophils Absolute 0.0  0.0 - 0.1  10e3/uL   NEUT% 78.5 (*) 38.4 - 76.8 %   LYMPH% 14.2  14.0 - 49.7 %   MONO% 5.2  0.0 - 14.0 %   EOS% 1.6  0.0 - 7.0 %   BASO% 0.5  0.0 - 2.0 %     Studies/Results:  No results found.  Medications: I have reviewed the patient's current medications. She has been given neupogen today.  Patient was given IV NS and IV zofran following MD visit and seemed somewhat more comfortable with that  Assessment/Plan: 1.Metastatic cervical cancer to liver, inguinal and periaortic nodes: progression in majority of liver mets by repeat CT now. Tolerated cycle 1 Alimta 07-21-12 poorly thus far, and may simply not be able to do further chemo. I will see her again in ~ a week. Note counts may drop further, but she has not tolerated neulasta previously. 3.atrial fibrillation and recurrent LE DVT after IVC filter: on xarelto  4.IVC filter in  5.right inguinal lymphocele: gradually improved  6.atrophic left kidney  7.LLE swelling due to surgery and pelvic RT and previous DVT also some better  8.PAC in  9. Lumbar compression fractures post kyphoplasties with path negative for malignancy. 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 Prolia given 07-11-12.    Reece Packer, MD   07/28/2012, 9:34 PM

## 2012-07-29 ENCOUNTER — Inpatient Hospital Stay (HOSPITAL_COMMUNITY)
Admission: EM | Admit: 2012-07-29 | Discharge: 2012-08-04 | DRG: 808 | Disposition: A | Discharge status: Home / Self Care | Payer: 59 | Attending: Internal Medicine | Admitting: Internal Medicine

## 2012-07-29 ENCOUNTER — Emergency Department (HOSPITAL_COMMUNITY): Payer: 59

## 2012-07-29 ENCOUNTER — Other Ambulatory Visit: Payer: Self-pay | Admitting: Oncology

## 2012-07-29 ENCOUNTER — Other Ambulatory Visit: Payer: Self-pay

## 2012-07-29 ENCOUNTER — Encounter (HOSPITAL_COMMUNITY): Payer: Self-pay | Admitting: Emergency Medicine

## 2012-07-29 ENCOUNTER — Inpatient Hospital Stay (HOSPITAL_COMMUNITY): Payer: 59

## 2012-07-29 DIAGNOSIS — C539 Malignant neoplasm of cervix uteri, unspecified: Secondary | ICD-10-CM

## 2012-07-29 DIAGNOSIS — E43 Unspecified severe protein-calorie malnutrition: Secondary | ICD-10-CM | POA: Diagnosis present

## 2012-07-29 DIAGNOSIS — E871 Hypo-osmolality and hyponatremia: Secondary | ICD-10-CM | POA: Diagnosis present

## 2012-07-29 DIAGNOSIS — I82509 Chronic embolism and thrombosis of unspecified deep veins of unspecified lower extremity: Secondary | ICD-10-CM | POA: Diagnosis present

## 2012-07-29 DIAGNOSIS — D696 Thrombocytopenia, unspecified: Secondary | ICD-10-CM | POA: Diagnosis present

## 2012-07-29 DIAGNOSIS — D638 Anemia in other chronic diseases classified elsewhere: Secondary | ICD-10-CM | POA: Diagnosis present

## 2012-07-29 DIAGNOSIS — I10 Essential (primary) hypertension: Secondary | ICD-10-CM | POA: Diagnosis present

## 2012-07-29 DIAGNOSIS — C774 Secondary and unspecified malignant neoplasm of inguinal and lower limb lymph nodes: Secondary | ICD-10-CM | POA: Diagnosis present

## 2012-07-29 DIAGNOSIS — M7989 Other specified soft tissue disorders: Secondary | ICD-10-CM | POA: Diagnosis present

## 2012-07-29 DIAGNOSIS — I82409 Acute embolism and thrombosis of unspecified deep veins of unspecified lower extremity: Secondary | ICD-10-CM | POA: Diagnosis present

## 2012-07-29 DIAGNOSIS — E860 Dehydration: Secondary | ICD-10-CM

## 2012-07-29 DIAGNOSIS — D709 Neutropenia, unspecified: Secondary | ICD-10-CM | POA: Diagnosis present

## 2012-07-29 DIAGNOSIS — C787 Secondary malignant neoplasm of liver and intrahepatic bile duct: Secondary | ICD-10-CM | POA: Diagnosis present

## 2012-07-29 DIAGNOSIS — N1330 Unspecified hydronephrosis: Secondary | ICD-10-CM | POA: Diagnosis present

## 2012-07-29 DIAGNOSIS — F329 Major depressive disorder, single episode, unspecified: Secondary | ICD-10-CM

## 2012-07-29 DIAGNOSIS — D630 Anemia in neoplastic disease: Secondary | ICD-10-CM | POA: Diagnosis present

## 2012-07-29 DIAGNOSIS — R509 Fever, unspecified: Secondary | ICD-10-CM

## 2012-07-29 DIAGNOSIS — E876 Hypokalemia: Secondary | ICD-10-CM | POA: Diagnosis present

## 2012-07-29 DIAGNOSIS — R197 Diarrhea, unspecified: Secondary | ICD-10-CM | POA: Diagnosis present

## 2012-07-29 DIAGNOSIS — D702 Other drug-induced agranulocytosis: Principal | ICD-10-CM | POA: Diagnosis present

## 2012-07-29 DIAGNOSIS — T451X5A Adverse effect of antineoplastic and immunosuppressive drugs, initial encounter: Secondary | ICD-10-CM | POA: Diagnosis present

## 2012-07-29 DIAGNOSIS — R5081 Fever presenting with conditions classified elsewhere: Secondary | ICD-10-CM

## 2012-07-29 DIAGNOSIS — G43909 Migraine, unspecified, not intractable, without status migrainosus: Secondary | ICD-10-CM

## 2012-07-29 DIAGNOSIS — K219 Gastro-esophageal reflux disease without esophagitis: Secondary | ICD-10-CM | POA: Diagnosis present

## 2012-07-29 DIAGNOSIS — Z7901 Long term (current) use of anticoagulants: Secondary | ICD-10-CM

## 2012-07-29 DIAGNOSIS — I4891 Unspecified atrial fibrillation: Secondary | ICD-10-CM | POA: Diagnosis present

## 2012-07-29 DIAGNOSIS — Z87891 Personal history of nicotine dependence: Secondary | ICD-10-CM

## 2012-07-29 DIAGNOSIS — D500 Iron deficiency anemia secondary to blood loss (chronic): Secondary | ICD-10-CM

## 2012-07-29 LAB — COMPREHENSIVE METABOLIC PANEL
AST: 56 U/L — ABNORMAL HIGH (ref 0–37)
Albumin: 2.6 g/dL — ABNORMAL LOW (ref 3.5–5.2)
Alkaline Phosphatase: 456 U/L — ABNORMAL HIGH (ref 39–117)
Alkaline Phosphatase: 415 U/L — ABNORMAL HIGH (ref 39–117)
BUN: 12 mg/dL (ref 6–23)
BUN: 9 mg/dL (ref 6–23)
CO2: 20 mEq/L (ref 19–32)
CO2: 21 mEq/L (ref 19–32)
Chloride: 94 mEq/L — ABNORMAL LOW (ref 96–112)
Chloride: 98 mEq/L (ref 96–112)
GFR calc Af Amer: >90 mL/min (ref >90)
GFR calc non Af Amer: >90 mL/min (ref >90)
Glucose, Bld: 164 mg/dL — ABNORMAL HIGH (ref 70–99)
Potassium: 3.6 mEq/L (ref 3.5–5.1)
Potassium: 3.2 mEq/L — ABNORMAL LOW (ref 3.5–5.1)
Total Bilirubin: 1.1 mg/dL (ref 0.3–1.2)
Total Bilirubin: 0.9 mg/dL (ref 0.3–1.2)
Total Protein: 6.1 g/dL (ref 6.0–8.3)

## 2012-07-29 LAB — CBC WITH DIFFERENTIAL/PLATELET
Basophils Relative: 1 % (ref 0–1)
Eosinophils Absolute: 0 10*3/uL (ref 0.0–0.7)
Eosinophils Relative: 2 % (ref 0–5)
HCT: 29 % — ABNORMAL LOW (ref 36.0–46.0)
Hemoglobin: 9.7 g/dL — ABNORMAL LOW (ref 12.0–15.0)
Lymphs Abs: 0.3 10*3/uL — ABNORMAL LOW (ref 0.7–4.0)
Lymphs Abs: 0.3 10*3/uL — ABNORMAL LOW (ref 0.7–4.0)
MCH: 27 pg (ref 26.0–34.0)
MCHC: 34.1 g/dL (ref 30.0–36.0)
MCV: 78.2 fL (ref 78.0–100.0)
Monocytes Absolute: 0.1 10*3/uL (ref 0.1–1.0)
Monocytes Relative: 10 % (ref 3–12)
Neutro Abs: 1.4 10*3/uL — ABNORMAL LOW (ref 1.7–7.7)
Neutrophils Relative %: 77 % (ref 43–77)
Platelets: 121 10*3/uL — ABNORMAL LOW (ref 150–400)
Platelets: 105 10*3/uL — ABNORMAL LOW (ref 150–400)
RBC: 3.71 MIL/uL — ABNORMAL LOW (ref 3.87–5.11)
RDW: 14.4 % (ref 11.5–15.5)
WBC: 1.9 10*3/uL — ABNORMAL LOW (ref 4.0–10.5)

## 2012-07-29 LAB — URINALYSIS, ROUTINE W REFLEX MICROSCOPIC
Glucose, UA: NEGATIVE mg/dL
Ketones, ur: 40 mg/dL — AB
Ketones, ur: NEGATIVE mg/dL
Leukocytes, UA: NEGATIVE
Nitrite: NEGATIVE
Protein, ur: 100 mg/dL — AB
Protein, ur: NEGATIVE mg/dL
Urobilinogen, UA: 1 mg/dL (ref 0.0–1.0)
pH: 5.5 (ref 5.0–8.0)

## 2012-07-29 LAB — URINE MICROSCOPIC-ADD ON

## 2012-07-29 LAB — MAGNESIUM: Magnesium: 2.1 mg/dL (ref 1.5–2.5)

## 2012-07-29 LAB — TSH: TSH: 3.289 u[IU]/mL (ref 0.350–4.500)

## 2012-07-29 LAB — LIPASE, BLOOD: Lipase: 7 U/L — ABNORMAL LOW (ref 11–59)

## 2012-07-29 MED ORDER — HYDROCODONE-ACETAMINOPHEN 5-325 MG PO TABS
1.0000 | ORAL_TABLET | ORAL | Status: DC | PRN
  Administered 2012-07-31 – 2012-08-04 (×6): 2 via ORAL
  Filled 2012-07-29 (×6): qty 2

## 2012-07-29 MED ORDER — HYDROMORPHONE HCL PF 2 MG/ML IJ SOLN
2.0000 mg | Freq: Once | INTRAMUSCULAR | Status: AC
  Administered 2012-07-29: 2 mg via INTRAVENOUS
  Filled 2012-07-29: qty 1

## 2012-07-29 MED ORDER — SODIUM CHLORIDE 0.9 % IV BOLUS (SEPSIS)
1000.0000 mL | Freq: Once | INTRAVENOUS | Status: AC
  Administered 2012-07-29: 1000 mL via INTRAVENOUS

## 2012-07-29 MED ORDER — TIZANIDINE HCL 4 MG PO TABS
4.0000 mg | ORAL_TABLET | Freq: Three times a day (TID) | ORAL | Status: DC
  Administered 2012-07-29 – 2012-08-04 (×19): 4 mg via ORAL
  Filled 2012-07-29 (×20): qty 1

## 2012-07-29 MED ORDER — ACETAMINOPHEN 650 MG RE SUPP: 650.0000 mg | Freq: Four times a day (QID) | RECTAL | Status: DC | PRN

## 2012-07-29 MED ORDER — K PHOS MONO-SOD PHOS DI & MONO 155-852-130 MG PO TABS
500.0000 mg | ORAL_TABLET | Freq: Two times a day (BID) | ORAL | Status: DC
  Administered 2012-07-29 – 2012-08-04 (×13): 500 mg via ORAL
  Filled 2012-07-29 (×14): qty 2

## 2012-07-29 MED ORDER — SALINE SPRAY 0.65 % NA SOLN
1.0000 | NASAL | Status: DC | PRN
  Administered 2012-07-31: 1 via NASAL

## 2012-07-29 MED ORDER — IOHEXOL 300 MG/ML  SOLN
100.0000 mL | Freq: Once | INTRAMUSCULAR | Status: AC | PRN
  Administered 2012-07-29: 100 mL via INTRAVENOUS

## 2012-07-29 MED ORDER — METOPROLOL TARTRATE 50 MG PO TABS
50.0000 mg | ORAL_TABLET | Freq: Two times a day (BID) | ORAL | Status: DC
  Administered 2012-07-29 – 2012-08-01 (×6): 50 mg via ORAL
  Filled 2012-07-29 (×8): qty 1

## 2012-07-29 MED ORDER — IOHEXOL 300 MG/ML  SOLN
50.0000 mL | Freq: Once | INTRAMUSCULAR | Status: AC | PRN
  Administered 2012-07-29: 50 mL via ORAL

## 2012-07-29 MED ORDER — FLUTICASONE PROPIONATE 50 MCG/ACT NA SUSP
2.0000 | Freq: Every day | NASAL | Status: DC
  Administered 2012-07-29 – 2012-08-04 (×7): 2 via NASAL
  Filled 2012-07-29: qty 16

## 2012-07-29 MED ORDER — LORAZEPAM 1 MG PO TABS: 1.0000 mg | ORAL_TABLET | Freq: Four times a day (QID) | ORAL | Status: DC | PRN

## 2012-07-29 MED ORDER — SODIUM CHLORIDE 0.9 % IV SOLN
INTRAVENOUS | Status: AC
  Administered 2012-07-29: 11:00:00 via INTRAVENOUS

## 2012-07-29 MED ORDER — ONDANSETRON HCL 4 MG/2ML IJ SOLN
4.0000 mg | Freq: Four times a day (QID) | INTRAMUSCULAR | Status: DC | PRN
  Administered 2012-08-04: 4 mg via INTRAVENOUS
  Filled 2012-07-29: qty 2

## 2012-07-29 MED ORDER — BISACODYL 5 MG PO TBEC: 5.0000 mg | DELAYED_RELEASE_TABLET | Freq: Every day | ORAL | Status: DC | PRN

## 2012-07-29 MED ORDER — SODIUM CHLORIDE 0.9 % IV BOLUS (SEPSIS)
250.0000 mL | Freq: Once | INTRAVENOUS | Status: AC
  Administered 2012-07-29: 250 mL via INTRAVENOUS

## 2012-07-29 MED ORDER — LORATADINE 10 MG PO TABS
10.0000 mg | ORAL_TABLET | Freq: Every day | ORAL | Status: DC
  Administered 2012-07-29 – 2012-08-04 (×7): 10 mg via ORAL
  Filled 2012-07-29 (×7): qty 1

## 2012-07-29 MED ORDER — PANTOPRAZOLE SODIUM 40 MG PO TBEC
40.0000 mg | DELAYED_RELEASE_TABLET | Freq: Every day | ORAL | Status: DC
  Administered 2012-07-29 – 2012-08-04 (×7): 40 mg via ORAL
  Filled 2012-07-29 (×8): qty 1

## 2012-07-29 MED ORDER — FENTANYL 25 MCG/HR TD PT72
25.0000 ug | MEDICATED_PATCH | TRANSDERMAL | Status: DC
  Administered 2012-07-31 – 2012-08-03 (×2): 25 ug via TRANSDERMAL
  Filled 2012-07-29 (×2): qty 1

## 2012-07-29 MED ORDER — DEXTROSE 5 % IV SOLN
1.0000 g | Freq: Two times a day (BID) | INTRAVENOUS | Status: DC
  Administered 2012-07-29 (×2): 1 g via INTRAVENOUS
  Filled 2012-07-29 (×3): qty 1

## 2012-07-29 MED ORDER — HYDROMORPHONE HCL PF 2 MG/ML IJ SOLN
2.0000 mg | INTRAMUSCULAR | Status: DC | PRN
  Administered 2012-07-30 – 2012-07-31 (×6): 2 mg via INTRAVENOUS
  Administered 2012-08-01 – 2012-08-04 (×14): 4 mg via INTRAVENOUS
  Filled 2012-07-29 (×3): qty 2
  Filled 2012-07-29 (×2): qty 1
  Filled 2012-07-29 (×3): qty 2
  Filled 2012-07-29 (×2): qty 1
  Filled 2012-07-29 (×2): qty 2
  Filled 2012-07-29: qty 1
  Filled 2012-07-29 (×3): qty 2
  Filled 2012-07-29 (×2): qty 1
  Filled 2012-07-29: qty 2
  Filled 2012-07-29: qty 1
  Filled 2012-07-29: qty 2

## 2012-07-29 MED ORDER — ZOLPIDEM TARTRATE 5 MG PO TABS: 5.0000 mg | ORAL_TABLET | Freq: Every evening | ORAL | Status: DC | PRN

## 2012-07-29 MED ORDER — SALINE NASAL SPRAY 0.65 % NA SOLN: 1.0000 | Freq: Every day | NASAL | Status: DC | PRN

## 2012-07-29 MED ORDER — CALCIUM CARBONATE-VITAMIN D 500-200 MG-UNIT PO TABS
1.0000 | ORAL_TABLET | Freq: Every morning | ORAL | Status: DC
  Administered 2012-07-29 – 2012-08-04 (×7): 1 via ORAL
  Filled 2012-07-29 (×7): qty 1

## 2012-07-29 MED ORDER — RIVAROXABAN 20 MG PO TABS
20.0000 mg | ORAL_TABLET | Freq: Every evening | ORAL | Status: DC
  Administered 2012-07-29 – 2012-08-04 (×7): 20 mg via ORAL
  Filled 2012-07-29 (×7): qty 1

## 2012-07-29 MED ORDER — ONDANSETRON HCL 4 MG PO TABS: 4.0000 mg | ORAL_TABLET | Freq: Four times a day (QID) | ORAL | Status: DC | PRN

## 2012-07-29 MED ORDER — POLYETHYLENE GLYCOL 3350 17 G PO PACK
17.0000 g | PACK | Freq: Two times a day (BID) | ORAL | Status: DC | PRN
  Administered 2012-08-01: 17 g via ORAL
  Filled 2012-07-29: qty 1

## 2012-07-29 MED ORDER — TIZANIDINE HCL 4 MG PO CAPS
4.0000 mg | ORAL_CAPSULE | Freq: Three times a day (TID) | ORAL | Status: DC
Start: 2012-07-29 — End: 2012-07-29

## 2012-07-29 MED ORDER — FOLIC ACID 1 MG PO TABS
1.0000 mg | ORAL_TABLET | Freq: Every morning | ORAL | Status: DC
  Administered 2012-07-29 – 2012-08-04 (×7): 1 mg via ORAL
  Filled 2012-07-29 (×7): qty 1

## 2012-07-29 MED ORDER — ACETAMINOPHEN 325 MG PO TABS
650.0000 mg | ORAL_TABLET | Freq: Four times a day (QID) | ORAL | Status: DC | PRN
  Administered 2012-07-29 – 2012-08-03 (×2): 650 mg via ORAL
  Filled 2012-07-29 (×2): qty 2

## 2012-07-29 MED ORDER — SODIUM CHLORIDE 0.9 % IJ SOLN
3.0000 mL | Freq: Two times a day (BID) | INTRAMUSCULAR | Status: DC
  Administered 2012-07-30 – 2012-08-04 (×5): 3 mL via INTRAVENOUS

## 2012-07-29 MED ORDER — ADULT MULTIVITAMIN W/MINERALS CH
1.0000 | ORAL_TABLET | Freq: Every morning | ORAL | Status: DC
  Administered 2012-07-29 – 2012-08-04 (×7): 1 via ORAL
  Filled 2012-07-29 (×7): qty 1

## 2012-07-29 MED ORDER — VANCOMYCIN HCL IN DEXTROSE 1-5 GM/200ML-% IV SOLN
1000.0000 mg | Freq: Two times a day (BID) | INTRAVENOUS | Status: DC
  Administered 2012-07-30 – 2012-07-31 (×4): 1000 mg via INTRAVENOUS
  Filled 2012-07-29 (×5): qty 200

## 2012-07-29 NOTE — Progress Notes (Signed)
Patient called to report fever to 102F.  She feels very weak.  She had chemo for cervical cancer with Alimta on 07/21/12.  Her WBC and ANC on 07/28/12 were low with borderline neutropenia.   I advised her to go to [facility] ED for evaluation for neutropenic fever.

## 2012-07-29 NOTE — ED Notes (Signed)
4e called for report, RN busy at this time 

## 2012-07-29 NOTE — Progress Notes (Signed)
Pt HR elevated to 170's with activity. MD notified. Pt asymptomatic. Metoprolol 50mg  to be given. Will continue to monitor. Drina Jobst, Lavone Orn, RN

## 2012-07-29 NOTE — H&P (Signed)
[facility] History and Physical  DALLAS SCORSONE ZOX:[id number] DOB: 1950/07/17 DOA: 07/29/2012  Referring physician: ER physician PCP: [name], MD   Chief Complaint: fever  HPI:  62 year old female with past medical history of cervical cancer on chemotherapy (Alimta) last dose received 07/21/2012, hypertension who presented to [facility] ED 07/29/2012 for evaluation of fever. [name] has spiked fever at home of 102 F. [name] also reported an episode of diarrhea but no significant nausea or vomiting. No chest pain, no shortness of breath, no palpitations. No blood in stool or urine. No lightheadedness or loss of consciousness. In ED, BP was 94/66 but it responded IV fluids and now BP 113/57. HR was 63 - 136 range. O2 saturation was 96% while breathing ambient air. CBC revealed pancytopenia with WBC of 1.9, Hgb of 9.3 and platelet count of 105. BMP showed hypokalemia of 3.2 and hyponatremia of 129. Her INR was 2.39 (although [name] is not taking coumadin but Xarelto). CT abdomen revealed moderate right side hydronephrosis but no obstruction and [name] does have urine output. Noted also were multiple liver metastases and metastatic cervical carcinoma.  Assessment and Plan:  Principal Problem:   Neutropenic fever - in the setting of metastatic cervical carcinoma and recent chemotherapy 07/21/2012 (alimta) - we will initiate empiric cefepime until blood culture and urine culture results are back - continue to monitor CBC Active Problems:   HYPERTENSION - continue metoprolol 50 mg PO BID   Atrial fibrillation - on xarelto - HR increased to 115 - start home dose metoprolol   Abnormal liver enzymes - secondary to liver mets   Cervical cancer - management per oncology   DVT (deep venous thrombosis) - on xarelto   Anemia of chronic disease - secondary to history of malignancy   Thrombocytopenia - likely sequela of chemothereapy   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 Negative for diaphoresis.  HENT: Negative for hearing loss, ear pain, nosebleeds, congestion, sore throat, neck pain, tinnitus and ear discharge.   Eyes: Negative for blurred vision, double vision, photophobia, pain, discharge and redness.  Respiratory: Negative for cough, hemoptysis, sputum production, shortness of breath, wheezing and stridor.   Cardiovascular: Negative for chest pain, palpitations, orthopnea, claudication and leg swelling.  Gastrointestinal: positive for abdominal pain and diarrhea. Negative for heartburn, constipation, blood in stool and melena.  Genitourinary: Negative for dysuria, urgency, frequency, hematuria and flank pain.  Musculoskeletal: Negative for myalgias, back pain, joint pain and falls.  Skin: Negative for itching and rash.  Neurological: Negative for dizziness and weakness. Negative for tingling, tremors, sensory change, speech change, focal weakness, loss of consciousness and headaches.  Endo/Heme/Allergies: Negative for environmental allergies and polydipsia. Does not bruise/bleed easily.  Psychiatric/Behavioral: Negative for suicidal ideas. The [name] is not nervous/anxious.      Past Medical History  Diagnosis Date  . Diastolic dysfunction   . MIGRAINE HEADACHE   . Atrial fibrillation     failed DCCV, CHADs2-prev pradaxa stopped due to hematuria with ureter issues/JJ   . Anemia   . HTN (hypertension)   . GLAUCOMA   . CARPAL TUNNEL SYNDROME, RIGHT   . Lymphedema     L>R leg from pelvic XRT/scarring  . Hepatic steatosis     CT scan 06/2009, 12/2009  . DVT (deep venous  thrombosis) 10/2010    LLE, anticaog resumed  . CHF (congestive heart failure)     1 yr ago per pt  . Dyspnea on exertion   . Arthritis   . GERD (gastroesophageal reflux disease)   .  Lesion of liver   . Small kidney     left  . Difficulty sleeping   . Radiation 10/11/11-11/19/11    5040 cGy in 28 fx's/periaortic  . Radiation 03/16/10-03/25/10    right inguinal node/left external iliac node  . Radiation 06/11/08-07/23/08    6000 cGy left pelvis  . Clotting disorder   . Hyperlipidemia   . Leg swelling   . Nausea   . Bruises easily   . Weakness   . Cervical cancer dx 04/2003, recur 04/2008    s/p debulk (TAHBSO), chemo/XRT; recurrent dz L pelvis dx 2010 and liver met 09/2010 s/p RFA  . Recurrent cervical cancer    Past Surgical History  Procedure Laterality Date  . Ureteral stent placement  2005, 01/2010, 07/2010    L hydro related to cervical ca and XRT  . Oophorectomy  2005  . Cardioversion    . Tonsillectomy    . Cholecystectomy  1984  . Total abdominal hysterectomy  2005  . Carpal tunnel  2009    right  . Ivc filter  04/2011   Social History:  reports that [name] quit smoking about 28 years ago. [name] has never used smokeless tobacco. [name] reports that [name] does not drink alcohol or use illicit drugs.  Allergies  Allergen Reactions  . Codeine Rash and Other (See Comments)    Breathing issues  . Flecainide Acetate Other (See Comments)    Doesn't remember reaction  . Morphine Other (See Comments)     Hallucinations  . Propafenone Hcl Other (See Comments)    Leg cramps    Family History:  Family History  Problem Relation Age of Onset  . Lung cancer    . Hypertension    . Heart disease    . Stroke    . Asthma    . Asthma Son   . Cancer Mother     lung  . Stroke Father      Prior to Admission medications   Medication Sig Start Date End Date Taking? Authorizing Provider  bisacodyl (DULCOLAX) 5 MG EC tablet Take 5 mg by mouth daily as needed for constipation.   Yes Historical Provider, MD  calcium-vitamin D (OSCAL WITH D) 500-200 MG-UNIT per tablet Take 1 tablet by mouth every morning.   Yes Historical Provider, MD  cetirizine (ZYRTEC) 10 MG tablet Take  10 mg by mouth every evening.    Yes Historical Provider, MD  dexamethasone (DECADRON) 4 MG tablet Take 4 mg by mouth 2 (two) times daily with a meal. Take the day before, day of, and day after chemo 07/11/12  Yes Adrena E Johnson, PA-C  fentaNYL (DURAGESIC - DOSED MCG/HR) 100 MCG/HR Place 1 patch onto the skin every 3 (three) days. Total dose 125mg  07/11/12  Yes Lennis P Livesay, MD  fentaNYL (DURAGESIC - DOSED MCG/HR) 25 MCG/HR Place 1 patch onto the skin every 3 (three) days. Total dose of 125 mcg 07/11/12  Yes Lennis Buzzy Han, MD  folic acid (FOLVITE) 1 MG tablet Take 1 mg by mouth every morning. 07/11/12  Yes Conni Slipper, PA-C  HYDROmorphone (DILAUDID) 4 MG tablet Take 1 tablet every 4 hours as needed for pain 07/11/12  Yes Lennis Buzzy Han, MD  ibuprofen (  ADVIL,MOTRIN) 200 MG tablet Take 600 mg by mouth 3 (three) times daily as needed for pain. Pain   Yes Historical Provider, MD  lidocaine-prilocaine (EMLA) cream Apply 1 application topically daily as needed (for port access).   Yes Historical Provider, MD  LORazepam (ATIVAN) 1 MG tablet Take 1 mg by mouth every 6 (six) hours as needed (for nausea (may put under tongue)).  03/21/12  Yes Historical Provider, MD  metoprolol (LOPRESSOR) 50 MG tablet Take 1 tablet (50 mg total) by mouth 2 (two) times daily. 07/19/12  Yes Hillis Range, MD  Multiple Vitamin (MULITIVITAMIN WITH MINERALS) TABS Take 1 tablet by mouth every morning.    Yes Historical Provider, MD  omeprazole (PRILOSEC) 20 MG capsule Take 20 mg by mouth every morning.    Yes Historical Provider, MD  ondansetron (ZOFRAN) 8 MG tablet Take by mouth every 8 (eight) hours as needed for nausea.   Yes Historical Provider, MD  polyethylene glycol (MIRALAX / GLYCOLAX) packet Take 17 g by mouth 2 (two) times daily as needed (constipation).    Yes Historical Provider, MD  Rivaroxaban (XARELTO) 20 MG TABS Take 20 mg by mouth every evening. 02/28/12  Yes Newt Lukes, MD  sodium chloride (OCEAN)  0.65 % nasal spray Place 1 spray into the nose daily as needed for congestion. Allergies   Yes Historical Provider, MD  tiZANidine (ZANAFLEX) 4 MG capsule Take 1 capsule (4 mg total) by mouth 3 (three) times daily. 05/11/12  Yes Newt Lukes, MD  triamcinolone (NASACORT) 55 MCG/ACT nasal inhaler Place 2 sprays into the nose daily as needed (allergies or congestion).    Yes Historical Provider, MD  zolpidem (AMBIEN) 5 MG tablet Take 1 tablet (5 mg total) by mouth at bedtime as needed for sleep. 02/18/12  Yes Lennis Buzzy Han, MD   Physical Exam: Filed Vitals:   07/29/12 0415 07/29/12 0617  BP: 111/71 108/63  Pulse: 127 136  Temp: 98.8 F (37.1 C) 98.7 F (37.1 C)  TempSrc: Oral Oral  Resp: 16 24  SpO2: 96% 97%    Physical Exam  Constitutional: Appears well-developed and well-nourished. No distress.  HENT: Normocephalic. External right and left ear normal. Oropharynx is clear and moist.  Eyes: Conjunctivae and EOM are normal. PERRLA, no scleral icterus.  Neck: Normal ROM. Neck supple. No JVD. No tracheal deviation. No thyromegaly.  CVS: RRR, S1/S2 +, no murmurs, no gallops, no carotid bruit.  Pulmonary: Effort and breath sounds normal, no stridor, rhonchi, wheezes, rales.  Abdominal: Soft. BS +,  no distension, tenderness in mid abdomen, no rebound or guarding.  Musculoskeletal: Normal range of motion. No edema and no tenderness.  Lymphadenopathy: No lymphadenopathy noted, cervical, inguinal. Neuro: Alert. Normal reflexes, muscle tone coordination. No cranial nerve deficit. Skin: Skin is warm and dry. No rash noted. Not diaphoretic. No erythema. No pallor.  Psychiatric: Normal mood and affect. Behavior, judgment, thought content normal.   Labs on Admission:  Basic Metabolic Panel:  Recent Labs Lab 07/29/12 0500  NA 125*  K 3.6  CL 94*  CO2 20  GLUCOSE 116*  BUN 12  CREATININE 0.81  CALCIUM 8.1*   Liver Function Tests:  Recent Labs Lab 07/29/12 0500  AST 56*   ALT 62*  ALKPHOS 456*  BILITOT 1.1  PROT 6.7  ALBUMIN 2.6*    Recent Labs Lab 07/29/12 0500  LIPASE 7*   No results found for this basename: AMMONIA,  in the last 168 hours CBC:  Recent Labs  Lab 07/28/12 1418 07/29/12 0500  WBC 2.0* 1.9*  NEUTROABS 1.6 1.4*  HGB 10.8* 9.7*  HCT 32.4* 29.0*  MCV 80.3 78.2  PLT 169 121*   Cardiac Enzymes: No results found for this basename: CKTOTAL, CKMB, CKMBINDEX, TROPONINI,  in the last 168 hours BNP: No components found with this basename: POCBNP,  CBG: No results found for this basename: GLUCAP,  in the last 168 hours  Radiological Exams on Admission: Dg Chest 2 View 07/29/2012   * IMPRESSION: Hypoaeration with linear left lung opacity, favor atelectasis.   Original Report Authenticated By: Jearld Lesch, M.D.   Ct Abdomen Pelvis W Contrast 07/29/2012   *IMPRESSION: New moderate right hydronephrosis.  The etiology of this is unclear.  The right proximal mid ureter is mildly dilated but is decompressed distally.  No stone is seen.  No masses is seen obstructing the ureter or within the bladder.  Mild fat stranding adjacent to the pancreas has increased from prior exam.  This also tracks along the left lateral coronal fascia and anterior pararenal fascia.  This is nonspecific.  Consider mild pancreatitis in the proper clinical setting.  No other evidence of an acute abnormality.  Metastatic disease from cervical carcinoma involving the left periaortic lymph nodes, a left external iliac chain presumed lymph node and multiple liver lesions.  This is stable from the recent prior exam.   Original Report Authenticated By: Amie Portland, M.D.    Code Status: Full Family Communication: Pt at bedside Disposition Plan: Admit for further evaluation  Manson Passey, MD  [facility] Pager [phone]  If 7PM-7AM, please contact night-coverage [email or web address] Password [facility] 07/29/2012, 9:53 AM

## 2012-07-29 NOTE — ED Notes (Signed)
Pt states receiving chemo for metastatic cervical cancer. Onset fever, nausea, and RUQ abdominal pain about 4 hours ago. Pt states temp at home was 102 degrees.

## 2012-07-29 NOTE — ED Notes (Signed)
[id number]<ON> Expected date:<BR> Expected time:<BR> Means of arrival:<BR> Comments:<BR> EMS cancer fever

## 2012-07-29 NOTE — Progress Notes (Signed)
ANTIBIOTIC CONSULT NOTE - INITIAL  Pharmacy Consult for vancomycin Indication: GPR blood culture  Allergies  Allergen Reactions  . Codeine Rash and Other (See Comments)    Breathing issues  . Flecainide Acetate Other (See Comments)    Doesn't remember reaction  . Morphine Other (See Comments)     Hallucinations  . Propafenone Hcl Other (See Comments)    Leg cramps    Patient Measurements: Height: 5\' 1"  (154.9 cm) Weight: 177 lb 12.8 oz (80.65 kg) IBW/kg (Calculated) : 47.8 Adjusted Body Weight:   Vital Signs: Temp: 98.8 F (37.1 C) (05/31 2240) Temp src: Oral (05/31 2118) BP: 92/60 mmHg (05/31 2314) Pulse Rate: 103 (05/31 2314) Intake/Output from previous day:   Intake/Output from this shift: Total I/O In: -  Out: 200 [Urine:200]  Labs:  Recent Labs  07/28/12 1418 07/29/12 0500 07/29/12 1207  WBC 2.0* 1.9* 1.9*  HGB 10.8* 9.7* 9.3*  PLT 169 121* 105*  CREATININE  --  0.81 0.73   Estimated Creatinine Clearance: 72 ml/min (by C-G formula based on Cr of 0.73). No results found for this basename: VANCOTROUGH, VANCOPEAK, VANCORANDOM, GENTTROUGH, GENTPEAK, GENTRANDOM, TOBRATROUGH, TOBRAPEAK, TOBRARND, AMIKACINPEAK, AMIKACINTROU, AMIKACIN,  in the last 72 hours   Microbiology: Recent Results (from the past 720 hour(s))  CULTURE, BLOOD (ROUTINE X 2)     Status: None   Collection Time    07/29/12  4:48 AM      Result Value Range Status   Specimen Description BLOOD LEFT HAND   Final   Special Requests BOTTLES DRAWN AEROBIC AND ANAEROBIC 5 CC EA   Final   Culture  Setup Time 07/29/2012 11:18   Final   Culture     Final   Value: GRAM POSITIVE RODS     Note: Gram Stain Report Called to,Read Back By and Verified With: JENNY BURNS 07/29/12 @ 9:32PM BY RUSCA.   Report Status PENDING   Incomplete    Medical History: Past Medical History  Diagnosis Date  . Diastolic dysfunction   . MIGRAINE HEADACHE   . Atrial fibrillation     failed DCCV, CHADs2-prev pradaxa  stopped due to hematuria with ureter issues/JJ   . Anemia   . HTN (hypertension)   . GLAUCOMA   . CARPAL TUNNEL SYNDROME, RIGHT   . Lymphedema     L>R leg from pelvic XRT/scarring  . Hepatic steatosis     CT scan 06/2009, 12/2009  . DVT (deep venous thrombosis) 10/2010    LLE, anticaog resumed  . CHF (congestive heart failure)     1 yr ago per pt  . Dyspnea on exertion   . Arthritis   . GERD (gastroesophageal reflux disease)   . Lesion of liver   . Small kidney     left  . Difficulty sleeping   . Radiation 10/11/11-11/19/11    5040 cGy in 28 fx's/periaortic  . Radiation 03/16/10-03/25/10    right inguinal node/left external iliac node  . Radiation 06/11/08-07/23/08    6000 cGy left pelvis  . Clotting disorder   . Hyperlipidemia   . Leg swelling   . Nausea   . Bruises easily   . Weakness   . Cervical cancer dx 04/2003, recur 04/2008    s/p debulk (TAHBSO), chemo/XRT; recurrent dz L pelvis dx 2010 and liver met 09/2010 s/p RFA  . Recurrent cervical cancer     Medications:  Anti-infectives   Start     Dose/Rate Route Frequency Ordered Stop   07/29/12 2345  vancomycin (VANCOCIN) IVPB 1000 mg/200 mL premix     1,000 mg 200 mL/hr over 60 Minutes Intravenous 2 times daily 07/29/12 2336     07/29/12 1100  ceFEPIme (MAXIPIME) 1 g in dextrose 5 % 50 mL IVPB     1 g 100 mL/hr over 30 Minutes Intravenous Every 12 hours 07/29/12 1009       Assessment: Patient on cefepime for broad spectrum antibiotic coverage.  GPR reported out in culture.  Cefepime does not cover listeria (a GPR), so [employer] wanted to cover for now in till full culture results obtained.   Goal of Therapy:  Vancomycin trough level 15-20 mcg/ml  Plan:  Measure antibiotic drug levels at steady state Follow up culture results Vancomycin 1gm iv q12hr  Aleene Davidson Crowford 07/29/2012,11:36 PM

## 2012-07-29 NOTE — ED Provider Notes (Signed)
History     CSN: [id number]  Arrival date & time 07/29/12  0403   First MD Initiated Contact with Patient 07/29/12 [phone]      Chief Complaint  Patient presents with  . Fever    (Consider location/radiation/quality/duration/timing/severity/associated sxs/prior treatment) Patient is a 62 y.o. female presenting with fever. The history is provided by the patient.  Fever Associated symptoms: diarrhea   Associated symptoms: no chest pain, no confusion, no dysuria, no headaches and no rash    patient is on chemotherapy for metastatic cervical cancer. She was dosed a week ago. She developed a fever up to 102 today. She states she's felt bad the last few days. No cough. No fevers. She's had some diarrhea. She's had abdominal pain also. She states the pain is worse in the upper abdomen. No vomiting. No dysuria. No sore throat. She was seen by her oncologist today and reports her white count was a little open not too bad  Past Medical History  Diagnosis Date  . Diastolic dysfunction   . MIGRAINE HEADACHE   . Atrial fibrillation     failed DCCV, CHADs2-prev pradaxa stopped due to hematuria with ureter issues/JJ   . Anemia   . HTN (hypertension)   . GLAUCOMA   . CARPAL TUNNEL SYNDROME, RIGHT   . Lymphedema     L>R leg from pelvic XRT/scarring  . Hepatic steatosis     CT scan 06/2009, 12/2009  . DVT (deep venous thrombosis) 10/2010    LLE, anticaog resumed  . CHF (congestive heart failure)     1 yr ago per pt  . Dyspnea on exertion   . Arthritis   . GERD (gastroesophageal reflux disease)   . Lesion of liver   . Small kidney     left  . Difficulty sleeping   . Radiation 10/11/11-11/19/11    5040 cGy in 28 fx's/periaortic  . Radiation 03/16/10-03/25/10    right inguinal node/left external iliac node  . Radiation 06/11/08-07/23/08    6000 cGy left pelvis  . Clotting disorder   . Hyperlipidemia   . Leg swelling   . Nausea   . Bruises easily   . Weakness   . Cervical cancer dx 04/2003,  recur 04/2008    s/p debulk (TAHBSO), chemo/XRT; recurrent dz L pelvis dx 2010 and liver met 09/2010 s/p RFA  . Recurrent cervical cancer     Past Surgical History  Procedure Laterality Date  . Ureteral stent placement  2005, 01/2010, 07/2010    L hydro related to cervical ca and XRT  . Oophorectomy  2005  . Cardioversion    . Tonsillectomy    . Cholecystectomy  1984  . Total abdominal hysterectomy  2005  . Carpal tunnel  2009    right  . Ivc filter  04/2011    Family History  Problem Relation Age of Onset  . Lung cancer    . Hypertension    . Heart disease    . Stroke    . Asthma    . Asthma Son   . Cancer Mother     lung  . Stroke Father     History  Substance Use Topics  . Smoking status: Former Smoker -- 0.50 packs/day for 3 years    Quit date: 03/01/1984  . Smokeless tobacco: Never Used  . Alcohol Use: No    OB History   Grav Para Term Preterm Abortions TAB SAB Ect Mult Living  Review of Systems  Constitutional: Positive for fever and fatigue. Negative for diaphoresis.  Eyes: Negative for pain.  Respiratory: Negative for chest tightness.   Cardiovascular: Negative for chest pain.  Gastrointestinal: Positive for abdominal pain and diarrhea. Negative for constipation, blood in stool and anal bleeding.  Genitourinary: Negative for dysuria and flank pain.  Musculoskeletal: Negative for back pain.  Skin: Negative for pallor and rash.  Neurological: Negative for tremors and headaches.  Psychiatric/Behavioral: Negative for confusion.    Allergies  Codeine; Flecainide acetate; Morphine; and Propafenone hcl  Home Medications   Current Outpatient Rx  Name  Route  Sig  Dispense  Refill  . bisacodyl (DULCOLAX) 5 MG EC tablet   Oral   Take 5 mg by mouth daily as needed for constipation.         . calcium-vitamin D (OSCAL WITH D) 500-200 MG-UNIT per tablet   Oral   Take 1 tablet by mouth every morning.         . cetirizine (ZYRTEC) 10  MG tablet   Oral   Take 10 mg by mouth every evening.          [name] dexamethasone (DECADRON) 4 MG tablet   Oral   Take 4 mg by mouth 2 (two) times daily with a meal. Take the day before, day of, and day after chemo         . fentaNYL (DURAGESIC - DOSED MCG/HR) 100 MCG/HR   Transdermal   Place 1 patch onto the skin every 3 (three) days. Total dose 125mg          . fentaNYL (DURAGESIC - DOSED MCG/HR) 25 MCG/HR   Transdermal   Place 1 patch onto the skin every 3 (three) days. Total dose of 125 mcg         . folic acid (FOLVITE) 1 MG tablet   Oral   Take 1 mg by mouth every morning.         [name] HYDROmorphone (DILAUDID) 4 MG tablet      Take 1 tablet every 4 hours as needed for pain   85 tablet   0   . ibuprofen (ADVIL,MOTRIN) 200 MG tablet   Oral   Take 600 mg by mouth 3 (three) times daily as needed for pain. Pain         . lidocaine-prilocaine (EMLA) cream   Topical   Apply 1 application topically daily as needed (for port access).         . LORazepam (ATIVAN) 1 MG tablet   Oral   Take 1 mg by mouth every 6 (six) hours as needed (for nausea (may put under tongue)).          . metoprolol (LOPRESSOR) 50 MG tablet   Oral   Take 1 tablet (50 mg total) by mouth 2 (two) times daily.   60 tablet   1   . Multiple Vitamin (MULITIVITAMIN WITH MINERALS) TABS   Oral   Take 1 tablet by mouth every morning.          [name] omeprazole (PRILOSEC) 20 MG capsule   Oral   Take 20 mg by mouth every morning.          . ondansetron (ZOFRAN) 8 MG tablet   Oral   Take by mouth every 8 (eight) hours as needed for nausea.         . polyethylene glycol (MIRALAX / GLYCOLAX) packet   Oral   Take 17 g by mouth 2 (  two) times daily as needed (constipation).          . Rivaroxaban (XARELTO) 20 MG TABS   Oral   Take 20 mg by mouth every evening.         . sodium chloride (OCEAN) 0.65 % nasal spray   Nasal   Place 1 spray into the nose daily as needed for congestion.  Allergies         . tiZANidine (ZANAFLEX) 4 MG capsule   Oral   Take 1 capsule (4 mg total) by mouth 3 (three) times daily.   40 capsule   1   . triamcinolone (NASACORT) 55 MCG/ACT nasal inhaler   Nasal   Place 2 sprays into the nose daily as needed (allergies or congestion).          [name] zolpidem (AMBIEN) 5 MG tablet   Oral   Take 1 tablet (5 mg total) by mouth at bedtime as needed for sleep.   20 tablet   0     BP 108/63  Pulse 136  Temp(Src) 98.7 F (37.1 C) (Oral)  Resp 24  SpO2 97%  Physical Exam  Nursing note and vitals reviewed. Constitutional: She is oriented to person, place, and time. She appears well-developed and well-nourished.  HENT:  Head: Normocephalic and atraumatic.  Eyes: EOM are normal. Pupils are equal, round, and reactive to light.  Neck: Normal range of motion. Neck supple.  Cardiovascular: Regular rhythm and normal heart sounds.   No murmur heard. Pulmonary/Chest: Effort normal and breath sounds normal. No respiratory distress. She has no wheezes. She has no rales.  Abdominal: Soft. Bowel sounds are normal. She exhibits no distension. There is tenderness. There is no rebound and no guarding.  Upper abdominal tenderness without rebound or guarding.  Musculoskeletal: Normal range of motion.  Neurological: She is alert and oriented to person, place, and time. No cranial nerve deficit.  Skin: Skin is warm and dry.  Psychiatric: She has a normal mood and affect. Her speech is normal.    ED Course  Procedures (including critical care time)  Labs Reviewed  CBC WITH DIFFERENTIAL - Abnormal; Notable for the following:    WBC 1.9 (*)    RBC 3.71 (*)    Hemoglobin 9.7 (*)    HCT 29.0 (*)    Platelets 121 (*)    Neutro Abs 1.4 (*)    Lymphs Abs 0.3 (*)    All other components within normal limits  COMPREHENSIVE METABOLIC PANEL - Abnormal; Notable for the following:    Sodium 125 (*)    Chloride 94 (*)    Glucose, Bld 116 (*)    Calcium 8.1  (*)    Albumin 2.6 (*)    AST 56 (*)    ALT 62 (*)    Alkaline Phosphatase 456 (*)    GFR calc non Af Amer 77 (*)    GFR calc Af Amer 90 (*)    All other components within normal limits  URINALYSIS, ROUTINE W REFLEX MICROSCOPIC - Abnormal; Notable for the following:    Color, Urine AMBER (*)    APPearance CLOUDY (*)    Hgb urine dipstick MODERATE (*)    Bilirubin Urine MODERATE (*)    Ketones, ur 40 (*)    Protein, ur 100 (*)    Urobilinogen, UA 4.0 (*)    Leukocytes, UA SMALL (*)    All other components within normal limits  CULTURE, BLOOD (ROUTINE X 2)  CULTURE, BLOOD (ROUTINE  X 2)  URINE MICROSCOPIC-ADD ON  LIPASE, BLOOD  CG4 I-STAT (LACTIC ACID)   Dg Chest 2 View  07/29/2012   *RADIOLOGY REPORT*  Clinical Data: Fever, weakness.  CHEST - 2 VIEW  Comparison: 12/31/2011 CT  Findings:  Hypoaeration.  Linear left lung opacity, favor atelectasis.  Right chest wall port catheter tip projects over the mid SVC.  No pneumothorax or definite pleural effusion.  Multilevel degenerative changes. partially imaged surgical clips right upper quadrant and lumbar vertebroplasty.  IMPRESSION: Hypoaeration with linear left lung opacity, favor atelectasis.   Original Report Authenticated By: Jearld Lesch, M.D.     1. Fever   2. Atrial fibrillation with rapid ventricular response   3. Dehydration       MDM  Patient with fever of 102 at home. He is on chemotherapy for cervical cancer. He is not neutropenic. She is in atrial for ablation with RVR which is likely due to dehydration and illness. She is receiving her second liter of IV fluid. She has a normal lactic acid. Urine does not show clear infection. Chest x-ray shows atelectasis. Patient is diffusely tender abdomen, worse in the right upper abdomen. She would get the abdomen/pelvis CT  Care turned over to Dr. Freida Busman.       Juliet Rude. Rubin Payor, MD 07/29/12 [phone]

## 2012-07-29 NOTE — Progress Notes (Signed)
CRITICAL VALUE ALERT  Critical value received:  Phosphorus 0.7  Date of notification:  5/31  Time of notification:  1300  Critical value read back:yes  Nurse who received alert:  [place] RN  MD notified (1st page):  Elisabeth Pigeon  Time of first page:  1301  MD notified (2nd page):  Time of second page:  Responding MD:  Elisabeth Pigeon  Time MD responded:  [phone]

## 2012-07-30 DIAGNOSIS — R509 Fever, unspecified: Secondary | ICD-10-CM

## 2012-07-30 DIAGNOSIS — E860 Dehydration: Secondary | ICD-10-CM

## 2012-07-30 DIAGNOSIS — E43 Unspecified severe protein-calorie malnutrition: Secondary | ICD-10-CM | POA: Insufficient documentation

## 2012-07-30 LAB — COMPREHENSIVE METABOLIC PANEL
ALT: 116 U/L — ABNORMAL HIGH (ref 0–35)
AST: 99 U/L — ABNORMAL HIGH (ref 0–37)
Albumin: 2.2 g/dL — ABNORMAL LOW (ref 3.5–5.2)
CO2: 21 mEq/L (ref 19–32)
Calcium: 7.3 mg/dL — ABNORMAL LOW (ref 8.4–10.5)
Chloride: 103 mEq/L (ref 96–112)
GFR calc non Af Amer: >90 mL/min (ref >90)
Sodium: 133 mEq/L — ABNORMAL LOW (ref 135–145)

## 2012-07-30 LAB — URINE CULTURE

## 2012-07-30 LAB — CBC
Hemoglobin: 9.3 g/dL — ABNORMAL LOW (ref 12.0–15.0)
MCH: 26.4 pg (ref 26.0–34.0)
MCHC: 33.1 g/dL (ref 30.0–36.0)
MCV: 79.8 fL (ref 78.0–100.0)
RBC: 3.52 MIL/uL — ABNORMAL LOW (ref 3.87–5.11)

## 2012-07-30 LAB — GLUCOSE, CAPILLARY: Glucose-Capillary: 104 mg/dL — ABNORMAL HIGH (ref 70–99)

## 2012-07-30 MED ORDER — SODIUM CHLORIDE 0.9 % IJ SOLN
10.0000 mL | INTRAMUSCULAR | Status: DC | PRN
  Administered 2012-08-01 – 2012-08-04 (×4): 10 mL

## 2012-07-30 MED ORDER — POTASSIUM CHLORIDE CRYS ER 20 MEQ PO TBCR
40.0000 meq | EXTENDED_RELEASE_TABLET | Freq: Once | ORAL | Status: AC
  Administered 2012-07-30: 40 meq via ORAL
  Filled 2012-07-30: qty 2

## 2012-07-30 MED ORDER — BOOST / RESOURCE BREEZE PO LIQD
1.0000 | Freq: Three times a day (TID) | ORAL | Status: DC
  Administered 2012-07-30 – 2012-08-03 (×9): 1 via ORAL

## 2012-07-30 MED ORDER — DEXTROSE 5 % IV SOLN
2.0000 g | Freq: Three times a day (TID) | INTRAVENOUS | Status: DC
  Administered 2012-07-30 – 2012-08-03 (×12): 2 g via INTRAVENOUS
  Filled 2012-07-30 (×13): qty 2

## 2012-07-30 NOTE — Progress Notes (Signed)
Pt did not have a bowel movement during my shift. Pt had fever of 102.0. Called MD. Jovita Gamma 2 tylenol. Pt. Bp was 97/60. Notified MD and monitored. Pt BP dropped to 79/52. Called MD. Gave bolus of fluid and BP came up to 92/60. Rechecked temperature and was 98.8. Pt skin was very pale, clammy and sweaty. Changed gown and bed linen twice during the night.

## 2012-07-30 NOTE — Progress Notes (Signed)
PT Cancellation Note  Patient Details Name: [name] MRN: [id number] DOB: 02/10/1951   Cancelled Treatment:    Reason Eval/Treat Not Completed: Patient not medically ready, decreased BP.   Rada Hay 07/30/2012, 7:59 AM Blanchard Kelch PT [phone]

## 2012-07-30 NOTE — Progress Notes (Signed)
[facility] PROGRESS NOTE  [name] ZOX:[id number] DOB: [birth year] DOA: 07/29/2012 PCP: [name], MD  Assessment/Plan: Neutropenic fever  - in the setting of metastatic cervical carcinoma and recent chemotherapy 07/21/2012 (alimta)  - we will initiate empiric cefepime/vanc until blood culture and urine culture results are back  - continue to monitor CBC   HYPERTENSION  - continue metoprolol 50 mg PO BID   Hypokalemia -replete   Atrial fibrillation  - on xarelto  - HR increased to 115  - start home dose metoprolol   Abnormal liver enzymes  - secondary to liver mets   Cervical cancer  - management per oncology   DVT (deep venous thrombosis)  - on xarelto   Anemia of chronic disease  - secondary to history of malignancy   Thrombocytopenia  - likely sequela of chemothereapy   Hyponatremia  - 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  - continue to monitor BMP      Code Status: full Family Communication: patient/husband at bedside Disposition Plan: home soon   Consultants:  none  Procedures:  none  Antibiotics:    HPI/Subjective: + fever Eating well  Objective: Filed Vitals:   07/29/12 2259 07/29/12 2302 07/29/12 2314 07/30/12 0536  BP: 87/54 86/56 92/60  102/77  Pulse: 99 97 103 112  Temp:    98 F (36.7 C)  TempSrc:    Oral  Resp:    16  Height:      Weight:    82.736 kg (182 lb 6.4 oz)  SpO2:    100%    Intake/Output Summary (Last 24 hours) at 07/30/12 1025 Last data filed at 07/30/12 0538  Gross per 24 hour  Intake 1038.33 ml  Output   1600 ml  Net -561.67 ml   Filed Weights   07/29/12 1134 07/30/12 0536  Weight: 80.65 kg (177 lb 12.8 oz) 82.736 kg (182 lb 6.4 oz)    Exam:   General:  A+O x 3, NAD  Cardiovascular: rrr  Respiratory: clear anterior  Abdomen: +BS, soft, mild tend  Musculoskeletal: moves all 4 ext   Data Reviewed: Basic Metabolic Panel:  Recent Labs Lab 07/29/12 0500 07/29/12 1207  07/30/12 0500  NA 125* 129* 133*  K 3.6 3.2* 3.1*  CL 94* 98 103  CO2 20 21 21   GLUCOSE 116* 164* 112*  BUN 12 9 6   CREATININE 0.81 0.73 0.68  CALCIUM 8.1* 7.6* 7.3*  MG  --  2.1  --   PHOS  --  0.7*  --    Liver Function Tests:  Recent Labs Lab 07/29/12 0500 07/29/12 1207 07/30/12 0500  AST 56* 48* 99*  ALT 62* 58* 116*  ALKPHOS 456* 415* 502*  BILITOT 1.1 0.9 1.1  PROT 6.7 6.1 5.9*  ALBUMIN 2.6* 2.4* 2.2*    Recent Labs Lab 07/29/12 0500  LIPASE 7*   No results found for this basename: AMMONIA,  in the last 168 hours CBC:  Recent Labs Lab 07/28/12 1418 07/29/12 0500 07/29/12 1207 07/30/12 0500  WBC 2.0* 1.9* 1.9* 1.9*  NEUTROABS 1.6 1.4* 1.5*  --   HGB 10.8* 9.7* 9.3* 9.3*  HCT 32.4* 29.0* 27.3* 28.1*  MCV 80.3 78.2 79.1 79.8  PLT 169 121* 105* 97*   Cardiac Enzymes: No results found for this basename: CKTOTAL, CKMB, CKMBINDEX, TROPONINI,  in the last 168 hours BNP (last 3 results) No results found for this basename: PROBNP,  in the last 8760 hours CBG:  Recent Labs Lab 07/30/12 0745  GLUCAP  104*    Recent Results (from the past 240 hour(s))  CULTURE, BLOOD (ROUTINE X 2)     Status: None   Collection Time    07/29/12  4:48 AM      Result Value Range Status   Specimen Description BLOOD LEFT HAND   Final   Special Requests BOTTLES DRAWN AEROBIC AND ANAEROBIC 5 CC EA   Final   Culture  Setup Time 07/29/2012 11:18   Final   Culture     Final   Value: BACILLUS SPECIES     Note: Standardized susceptibility testing for this organism is not available.     GRAM POSITIVE COCCI IN CLUSTERS     Note: Gram Stain Report Called to,Read Back By and Verified With: JENNY BURNS 07/29/12 @ 9:32PM BY RUSCA. Gram Stain Report Called to,Read Back By and Verified With: Mignon Pine RN on 07/30/12 at 06:04 by Christie Nottingham   Report Status PENDING   Incomplete     Studies: Dg Chest 2 View  07/29/2012   *RADIOLOGY REPORT*  Clinical Data: Fever, weakness.  CHEST - 2 VIEW   Comparison: 12/31/2011 CT  Findings:  Hypoaeration.  Linear left lung opacity, favor atelectasis.  Right chest wall port catheter tip projects over the mid SVC.  No pneumothorax or definite pleural effusion.  Multilevel degenerative changes. partially imaged surgical clips right upper quadrant and lumbar vertebroplasty.  IMPRESSION: Hypoaeration with linear left lung opacity, favor atelectasis.   Original Report Authenticated By: Jearld Lesch, M.D.   Ct Abdomen Pelvis W Contrast  07/29/2012   *RADIOLOGY REPORT*  Clinical Data: .  Fever.  History of metastatic cervical carcinoma.  CT ABDOMEN AND PELVIS WITH CONTRAST  Technique:  Multidetector CT imaging of the abdomen and pelvis was performed following the standard protocol during bolus administration of intravenous contrast.  Contrast: 50mL OMNIPAQUE IOHEXOL 300 MG/ML  SOLN, OMNIPAQUE IOHEXOL 300 MG/ML  SOLN  Comparison: 07/04/2012  Findings: There is subsegmental atelectasis at the lung bases.  No pulmonary nodules or pleural effusion.  The heart is mildly enlarged.  Multiple liver metastatic lesions are noted unchanged from the recent prior study.  The spleen is unremarkable.  The gallbladder is surgically absent.  No bile duct dilation.  The pancreas is partly fatty replaced.  There is some stranding in the peripancreatic and peri celiac fat pad is mildly increased from prior exam.  Consider a mild pancreatitis in the proper clinical setting.  No adrenal masses.  Marked atrophy of the left kidney with a small lower pole presumed cyst, stable.  There is moderate right hydronephrosis that has developed since the prior exam.  There is mild to moderate dilation of the right ureter.  The bladder distended.  No bladder masses seen.  There is no renal or ureteral stones.  The right ureter is decompressed in the pelvis.  The exact cause of the hydronephrosis is unclear.  Left periaortic adenopathy is stable.  There is a fluid density left external iliac  lesion that is also unchanged from the recent prior exam.  There are changes from hysterectomy.  No pelvic masses.  There is no ascites.  The bowel is unremarkable.  A normal appendix is visualized.  Multiple lumbar compression fractures are noted to which had been treated with a previous vertebroplasty.  These are stable.  No osteoblastic or osteolytic lesions.  IMPRESSION: New moderate right hydronephrosis.  The etiology of this is unclear.  The right proximal mid ureter is mildly dilated but  is decompressed distally.  No stone is seen.  No masses is seen obstructing the ureter or within the bladder.  Mild fat stranding adjacent to the pancreas has increased from prior exam.  This also tracks along the left lateral coronal fascia and anterior pararenal fascia.  This is nonspecific.  Consider mild pancreatitis in the proper clinical setting.  No other evidence of an acute abnormality.  Metastatic disease from cervical carcinoma involving the left periaortic lymph nodes, a left external iliac chain presumed lymph node and multiple liver lesions.  This is stable from the recent prior exam.   Original Report Authenticated By: Amie Portland, M.D.   US Renal  07/29/2012   *RADIOLOGY REPORT*  Clinical Data: Right hydronephrosis  RENAL/URINARY TRACT ULTRASOUND COMPLETE  Comparison:  CT abdomen pelvis dated 07/29/2012  Findings:  Right Kidney:  Measures 13.0 cm.  Mild fullness of the renal pelvis.  This appearance is less conspicuous than on recent CT.  Left Kidney:  Poorly visualized/atrophic.  Measures 7.2 cm.  No gross hydronephrosis.  Bladder:  Within normal limits.  IMPRESSION: Mild fullness of the right renal pelvis.  The appearance is less conspicuous than on recent CT.   Original Report Authenticated By: Charline Bills, M.D.    Scheduled Meds: . calcium-vitamin D  1 tablet Oral q morning - 10a  . ceFEPime (MAXIPIME) IV  2 g Intravenous Q8H  . [START ON 07/31/2012] fentaNYL  25 mcg Transdermal Q72H  .  fluticasone  2 spray Each Nare Daily  . folic acid  1 mg Oral q morning - 10a  . loratadine  10 mg Oral Daily  . metoprolol  50 mg Oral BID  . multivitamin with minerals  1 tablet Oral q morning - 10a  . pantoprazole  40 mg Oral Daily  . phosphorus  500 mg Oral BID  . Rivaroxaban  20 mg Oral QPM  . sodium chloride  3 mL Intravenous Q12H  . tiZANidine  4 mg Oral TID  . vancomycin  1,000 mg Intravenous BID   Continuous Infusions:   Principal Problem:   Neutropenic fever Active Problems:   HYPERTENSION   Atrial fibrillation   Cervical cancer   DVT (deep venous thrombosis)   Anemia of chronic disease   Thrombocytopenia   Hyponatremia   Diarrhea    Time spent: 35    [facility], Mirza Fessel  [facility] Pager [phone]. If 7PM-7AM, please contact night-coverage at [email or web address], password [facility] 07/30/2012, 10:25 AM  LOS: 1 day

## 2012-07-30 NOTE — Progress Notes (Signed)
INITIAL NUTRITION ASSESSMENT  DOCUMENTATION CODES Per approved criteria  -Severe malnutrition in the context of chronic illness -Obesity Unspecified   INTERVENTION: 1. Resource Breeze TID between meals (8 oz provides 250 kcal, 9 g protein).  2. Patient encouraged to see outpatient RD at [facility] for assistance maintaining adequate oral intake throughout treatment.   NUTRITION DIAGNOSIS: Inadequate oral intake related to chemotherapy treatment as evidenced by 50% intake of meals.   Goal: Patient will meet >/=90% of estimated nutrition needs  Monitor:  PO intake, weight, labs  Reason for Assessment: Consult, poor PO intake  62 y.o. female  Admitting Dx: Neutropenic fever  ASSESSMENT: Patient with a history of cervical cancer currently on chemotherapy. She reports that she has had a very poor appetite over the last 3 months. She has lost 15-20 pounds (8% of UBW) over that time, meeting the criteria for severe malnutrition in the context of chronic illness. She reports that her appetite has improved since admission. She does not like Ensure/Boost shakes, but is willing to try [employer].   Height: Ht Readings from Last 1 Encounters:  07/29/12 5\' 1"  (1.549 m)    Weight: Wt Readings from Last 1 Encounters:  07/30/12 182 lb 6.4 oz (82.736 kg)    Ideal Body Weight: 47.7 kg  % Ideal Body Weight: 173%  Wt Readings from Last 10 Encounters:  07/30/12 182 lb 6.4 oz (82.736 kg)  07/11/12 186 lb 4.8 oz (84.505 kg)  06/14/12 197 lb 3.2 oz (89.449 kg)  05/25/12 194 lb (87.998 kg)  05/16/12 190 lb 4.8 oz (86.32 kg)  04/11/12 198 lb 12.8 oz (90.175 kg)  04/04/12 192 lb (87.091 kg)  03/21/12 197 lb 11.2 oz (89.676 kg)  03/14/12 199 lb 6.4 oz (90.447 kg)  03/08/12 198 lb 3.2 oz (89.903 kg)    Usual Body Weight: 90 kg  % Usual Body Weight: 92%  BMI:  Body mass index is 34.48 kg/(m^2). Patient is obese.   Estimated Nutritional Needs: Kcal: 1875-2000 kcal Protein:  105-120 g Fluid: >2.8 L  Skin: Incisions, right groin, right upper abdomen  Diet Order: General  EDUCATION NEEDS: -No education needs identified at this time   Intake/Output Summary (Last 24 hours) at 07/30/12 1231 Last data filed at 07/30/12 1100  Gross per 24 hour  Intake 1228.33 ml  Output   1600 ml  Net -371.67 ml    Last BM: PTA   Labs:   Recent Labs Lab 07/29/12 0500 07/29/12 1207 07/30/12 0500  NA 125* 129* 133*  K 3.6 3.2* 3.1*  CL 94* 98 103  CO2 20 21 21   BUN 12 9 6   CREATININE 0.81 0.73 0.68  CALCIUM 8.1* 7.6* 7.3*  MG  --  2.1  --   PHOS  --  0.7*  --   GLUCOSE 116* 164* 112*    CBG (last 3)   Recent Labs  07/30/12 0745  GLUCAP 104*    Scheduled Meds: . calcium-vitamin D  1 tablet Oral q morning - 10a  . ceFEPime (MAXIPIME) IV  2 g Intravenous Q8H  . [START ON 07/31/2012] fentaNYL  25 mcg Transdermal Q72H  . fluticasone  2 spray Each Nare Daily  . folic acid  1 mg Oral q morning - 10a  . loratadine  10 mg Oral Daily  . metoprolol  50 mg Oral BID  . multivitamin with minerals  1 tablet Oral q morning - 10a  . pantoprazole  40 mg Oral Daily  .  phosphorus  500 mg Oral BID  . Rivaroxaban  20 mg Oral QPM  . sodium chloride  3 mL Intravenous Q12H  . tiZANidine  4 mg Oral TID  . vancomycin  1,000 mg Intravenous BID    Continuous Infusions:   Past Medical History  Diagnosis Date  . Diastolic dysfunction   . MIGRAINE HEADACHE   . Atrial fibrillation     failed DCCV, CHADs2-prev pradaxa stopped due to hematuria with ureter issues/JJ   . Anemia   . HTN (hypertension)   . GLAUCOMA   . CARPAL TUNNEL SYNDROME, RIGHT   . Lymphedema     L>R leg from pelvic XRT/scarring  . Hepatic steatosis     CT scan 06/2009, 12/2009  . DVT (deep venous thrombosis) 10/2010    LLE, anticaog resumed  . CHF (congestive heart failure)     1 yr ago per pt  . Dyspnea on exertion   . Arthritis   . GERD (gastroesophageal reflux disease)   . Lesion of liver    . Small kidney     left  . Difficulty sleeping   . Radiation 10/11/11-11/19/11    5040 cGy in 28 fx's/periaortic  . Radiation 03/16/10-03/25/10    right inguinal node/left external iliac node  . Radiation 06/11/08-07/23/08    6000 cGy left pelvis  . Clotting disorder   . Hyperlipidemia   . Leg swelling   . Nausea   . Bruises easily   . Weakness   . Cervical cancer dx 04/2003, recur 04/2008    s/p debulk (TAHBSO), chemo/XRT; recurrent dz L pelvis dx 2010 and liver met 09/2010 s/p RFA  . Recurrent cervical cancer     Past Surgical History  Procedure Laterality Date  . Ureteral stent placement  2005, 01/2010, 07/2010    L hydro related to cervical ca and XRT  . Oophorectomy  2005  . Cardioversion    . Tonsillectomy    . Cholecystectomy  1984  . Total abdominal hysterectomy  2005  . Carpal tunnel  2009    right  . Ivc filter  04/2011    Linnell Fulling, RD, LDN Pager #: [phone] After-Hours Pager #: [phone]

## 2012-07-31 DIAGNOSIS — C787 Secondary malignant neoplasm of liver and intrahepatic bile duct: Secondary | ICD-10-CM

## 2012-07-31 DIAGNOSIS — C779 Secondary and unspecified malignant neoplasm of lymph node, unspecified: Secondary | ICD-10-CM

## 2012-07-31 LAB — CBC
HCT: 23.6 % — ABNORMAL LOW (ref 36.0–46.0)
MCH: 26.3 pg (ref 26.0–34.0)
MCHC: 33.1 g/dL (ref 30.0–36.0)
MCV: 79.5 fL (ref 78.0–100.0)
Platelets: 110 10*3/uL — ABNORMAL LOW (ref 150–400)
RDW: 15 % (ref 11.5–15.5)

## 2012-07-31 LAB — BASIC METABOLIC PANEL
BUN: 6 mg/dL (ref 6–23)
Calcium: 7 mg/dL — ABNORMAL LOW (ref 8.4–10.5)
Creatinine, Ser: 0.78 mg/dL (ref 0.50–1.10)
GFR calc Af Amer: >90 mL/min (ref >90)

## 2012-07-31 LAB — DIFFERENTIAL
Basophils Absolute: 0 10*3/uL (ref 0.0–0.1)
Basophils Relative: 0 % (ref 0–1)
Eosinophils Relative: 3 % (ref 0–5)
Monocytes Absolute: 0.3 10*3/uL (ref 0.1–1.0)
Monocytes Relative: 16 % — ABNORMAL HIGH (ref 3–12)

## 2012-07-31 LAB — GLUCOSE, CAPILLARY: Glucose-Capillary: 134 mg/dL — ABNORMAL HIGH (ref 70–99)

## 2012-07-31 MED ORDER — FILGRASTIM 300 MCG/ML IJ SOLN
300.0000 ug | Freq: Every day | INTRAMUSCULAR | Status: DC
  Administered 2012-07-31 – 2012-08-01 (×2): 300 ug via SUBCUTANEOUS
  Filled 2012-07-31 (×3): qty 1

## 2012-07-31 MED ORDER — POTASSIUM CHLORIDE CRYS ER 20 MEQ PO TBCR
40.0000 meq | EXTENDED_RELEASE_TABLET | Freq: Once | ORAL | Status: AC
  Administered 2012-07-31: 40 meq via ORAL
  Filled 2012-07-31: qty 2

## 2012-07-31 MED ORDER — FENTANYL 100 MCG/HR TD PT72
100.0000 ug | MEDICATED_PATCH | TRANSDERMAL | Status: DC
  Administered 2012-07-31 – 2012-08-03 (×2): 100 ug via TRANSDERMAL
  Filled 2012-07-31 (×2): qty 1

## 2012-07-31 MED ORDER — POTASSIUM CHLORIDE IN NACL 20-0.9 MEQ/L-% IV SOLN
INTRAVENOUS | Status: DC
  Administered 2012-07-31 – 2012-08-02 (×4): via INTRAVENOUS
  Filled 2012-07-31 (×5): qty 1000

## 2012-07-31 NOTE — Progress Notes (Signed)
[facility] PROGRESS NOTE  [name] FERREBEE WJX:[id number] DOB: [birth year] DOA: 07/29/2012 PCP: [name], MD  Assessment/Plan: Neutropenic fever  - in the setting of metastatic cervical carcinoma and recent chemotherapy 07/21/2012 (alimta)  - we will initiate empiric cefepime  - blood culture and urine culture results so far only show contaminant - continue to monitor CBC   HYPERTENSION  - continue metoprolol 50 mg PO BID   Hypokalemia -replete -check Mg   Atrial fibrillation  - on xarelto  - HR increased to 115  - start home dose metoprolol   Abnormal liver enzymes  - secondary to liver mets   Cervical cancer  - management per oncology   DVT (deep venous thrombosis)  - on xarelto   Anemia of chronic disease  - secondary to history of malignancy   Thrombocytopenia  - likely sequela of chemothereapy   Hyponatremia  - 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  - continue to monitor BMP   Hypotension -IVF   Code Status: full Family Communication: patient/husband at bedside Disposition Plan: home soon   Consultants:  none  Procedures:  none  Antibiotics:    HPI/Subjective: + fever Eating well No BM  Objective: Filed Vitals:   07/31/12 0815 07/31/12 0830 07/31/12 1019 07/31/12 1300  BP: 108/86  93/55 84/52  Pulse: 165 91 122 88  Temp: 98.9 F (37.2 C)   97.5 F (36.4 C)  TempSrc: Oral   Oral  Resp:    16  Height:      Weight:      SpO2: 99%   100%    Intake/Output Summary (Last 24 hours) at 07/31/12 1346 Last data filed at 07/31/12 0900  Gross per 24 hour  Intake   1230 ml  Output   1600 ml  Net   -370 ml   Filed Weights   07/29/12 1134 07/30/12 0536 07/31/12 0456  Weight: 80.65 kg (177 lb 12.8 oz) 82.736 kg (182 lb 6.4 oz) 81.9 kg (180 lb 8.9 oz)    Exam:   General:  A+O x 3, NAD  Cardiovascular: rrr  Respiratory: clear anterior  Abdomen: +BS, soft, mild tend  Musculoskeletal: moves all 4 ext   Data  Reviewed: Basic Metabolic Panel:  Recent Labs Lab 07/29/12 0500 07/29/12 1207 07/30/12 0500 07/31/12 0420  NA 125* 129* 133* 133*  K 3.6 3.2* 3.1* 2.9*  CL 94* 98 103 101  CO2 20 21 21 23   GLUCOSE 116* 164* 112* 146*  BUN 12 9 6 6   CREATININE 0.81 0.73 0.68 0.78  CALCIUM 8.1* 7.6* 7.3* 7.0*  MG  --  2.1  --  1.8  PHOS  --  0.7*  --   --    Liver Function Tests:  Recent Labs Lab 07/29/12 0500 07/29/12 1207 07/30/12 0500  AST 56* 48* 99*  ALT 62* 58* 116*  ALKPHOS 456* 415* 502*  BILITOT 1.1 0.9 1.1  PROT 6.7 6.1 5.9*  ALBUMIN 2.6* 2.4* 2.2*    Recent Labs Lab 07/29/12 0500  LIPASE 7*   No results found for this basename: AMMONIA,  in the last 168 hours CBC:  Recent Labs Lab 07/28/12 1418 07/29/12 0500 07/29/12 1207 07/30/12 0500 07/31/12 0420  WBC 2.0* 1.9* 1.9* 1.9* 2.1*  NEUTROABS 1.6 1.4* 1.5*  --   --   HGB 10.8* 9.7* 9.3* 9.3* 7.8*  HCT 32.4* 29.0* 27.3* 28.1* 23.6*  MCV 80.3 78.2 79.1 79.8 79.5  PLT 169 121* 105* 97* 110*   Cardiac Enzymes:  No results found for this basename: CKTOTAL, CKMB, CKMBINDEX, TROPONINI,  in the last 168 hours BNP (last 3 results) No results found for this basename: PROBNP,  in the last 8760 hours CBG:  Recent Labs Lab 07/30/12 0745 07/31/12 0737  GLUCAP 104* 134*    Recent Results (from the past 240 hour(s))  CULTURE, BLOOD (ROUTINE X 2)     Status: None   Collection Time    07/29/12  4:48 AM      Result Value Range Status   Specimen Description BLOOD LEFT HAND   Final   Special Requests BOTTLES DRAWN AEROBIC AND ANAEROBIC 5 CC EA   Final   Culture  Setup Time 07/29/2012 11:18   Final   Culture     Final   Value: BACILLUS SPECIES     Note: Standardized susceptibility testing for this organism is not available.     STAPHYLOCOCCUS SPECIES (COAGULASE NEGATIVE)     Note: THE SIGNIFICANCE OF ISOLATING THIS ORGANISM FROM A SINGLE SET OF BLOOD CULTURES WHEN MULTIPLE SETS ARE DRAWN IS UNCERTAIN. PLEASE NOTIFY THE  MICROBIOLOGY DEPARTMENT WITHIN ONE WEEK IF SPECIATION AND SENSITIVITIES ARE REQUIRED.     Note: Gram Stain Report Called to,Read Back By and Verified With: JENNY BURNS 07/29/12 @ 9:32PM BY RUSCA. Gram Stain Report Called to,Read Back By and Verified With: Mignon Pine RN on 07/30/12 at 06:04 by Christie Nottingham   Report Status 07/31/2012 FINAL   Final  CULTURE, BLOOD (ROUTINE X 2)     Status: None   Collection Time    07/29/12  5:00 AM      Result Value Range Status   Specimen Description BLOOD RIGHT HAND   Final   Special Requests BOTTLES DRAWN AEROBIC AND ANAEROBIC 5 CC EA   Final   Culture  Setup Time 07/29/2012 11:18   Final   Culture     Final   Value:        BLOOD CULTURE RECEIVED NO GROWTH TO DATE CULTURE WILL BE HELD FOR 5 DAYS BEFORE ISSUING A FINAL NEGATIVE REPORT   Report Status PENDING   Incomplete  URINE CULTURE     Status: None   Collection Time    07/29/12 10:46 AM      Result Value Range Status   Specimen Description URINE, CLEAN CATCH   Final   Special Requests NONE   Final   Culture  Setup Time 07/29/2012 14:07   Final   Colony Count 35,000 COLONIES/ML   Final   Culture     Final   Value: Multiple bacterial morphotypes present, none predominant. Suggest appropriate recollection if clinically indicated.   Report Status 07/30/2012 FINAL   Final     Studies: US Renal  07/29/2012   *RADIOLOGY REPORT*  Clinical Data: Right hydronephrosis  RENAL/URINARY TRACT ULTRASOUND COMPLETE  Comparison:  CT abdomen pelvis dated 07/29/2012  Findings:  Right Kidney:  Measures 13.0 cm.  Mild fullness of the renal pelvis.  This appearance is less conspicuous than on recent CT.  Left Kidney:  Poorly visualized/atrophic.  Measures 7.2 cm.  No gross hydronephrosis.  Bladder:  Within normal limits.  IMPRESSION: Mild fullness of the right renal pelvis.  The appearance is less conspicuous than on recent CT.   Original Report Authenticated By: Charline Bills, M.D.    Scheduled Meds: . calcium-vitamin D   1 tablet Oral q morning - 10a  . ceFEPime (MAXIPIME) IV  2 g Intravenous Q8H  . feeding supplement  1  Container Oral TID BM  . fentaNYL  100 mcg Transdermal Q72H  . fentaNYL  25 mcg Transdermal Q72H  . fluticasone  2 spray Each Nare Daily  . folic acid  1 mg Oral q morning - 10a  . loratadine  10 mg Oral Daily  . metoprolol  50 mg Oral BID  . multivitamin with minerals  1 tablet Oral q morning - 10a  . pantoprazole  40 mg Oral Daily  . phosphorus  500 mg Oral BID  . potassium chloride  40 mEq Oral Once  . Rivaroxaban  20 mg Oral QPM  . sodium chloride  3 mL Intravenous Q12H  . tiZANidine  4 mg Oral TID   Continuous Infusions: . 0.9 % NaCl with KCl 20 mEq / L      Principal Problem:   Neutropenic fever Active Problems:   HYPERTENSION   Atrial fibrillation   Cervical cancer   DVT (deep venous thrombosis)   Anemia of chronic disease   Thrombocytopenia   Hyponatremia   Diarrhea   Protein-calorie malnutrition, severe    Time spent: 35    [facility], JESSICA  [facility] Pager [phone]. If 7PM-7AM, please contact night-coverage at [email or web address], password [facility] 07/31/2012, 1:46 PM  LOS: 2 days

## 2012-07-31 NOTE — Progress Notes (Signed)
07/31/2012, 1:25 PM  Hospital day: 3 Antibiotics: maxipime Chemotherapy: #1 Alimta 07-21-12  Appreciate notification of admission by hospitalist service for temperature 102 on 07-29-12 AM.    Oncologic History  Patient had radical hysterectomy, bilateral salpingo-oophorectomy and pelvic lymphadenectomy in March of 2005 by Dr Ronita Hipps for a stage IB cervical cancer. Final pathology showed no residual disease and all lymph nodes were negative. The patient was followed for 5 years with no evidence of disease until she developed a recurrence of the right pelvic sidewall in February of 2010. This caused obstruction of the right ureter. She was treated with radiation therapy and concurrent cisplatin chemotherapy. In late 2011 she had recurrence in right inguinal lymph node and left pelvic nodes and received additional radiation therapy to those areas, completed in January 2012. In August 2012 she had a 2.8 cm liver lesion and received 6 cycles of Taxol + one cycle of carboplatin. She had a partial response to chemo, then increase in the solitary liver lesion and RFA was performed on 10/07/2011. 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 After ~ 3 months she was changed to xarelto by Dr Felicity Coyer because of local bruising and discomfort from the lovenox injections; she continues xarelto now. She had progression in left periaortic nodes which was symptomatic with pain and treated by Dr Roselind Messier with 5040 cGY in 28 fractions from 10-11-11 thru 11-19-11. Pain has improved since radiation and she has been able to decrease pain medication to present oycontin 20 mg q 12 hrs with prn percocet. CT CAP repeated 12-31-11 found multiple new liver lesions, small left inguinal nodes and a 5 cm fluid collection right groin. Chemotherapy was resumed with low dose carboplatin and taxol on  02-01-12, three cycles thru 03-28-12, complicated by cytopenias despite decreased doses.  CT AP 04-07-12 showed increase in size of most of the liver mets, tho no new disease elsewhere. Other interventions for the cancer were delayed due to acute compression fractures of lumbar spine treated with kyphoplasty to L1 and L3, with additional fractures L2 and L4. With further increase in hepatic mets by CT in early May, and as lumbar spine symptoms were improved, she was treated with Alimta cycle 1 on 07-21-12. She was seen at [facility] on 07-28-12, afebrile but feeling badly since chemo, given IVF and one dose neupogen.    Subjective: Feeling some better since admission. Tolerating Resource. Has had constipation (no diarrhea). No localizing source of infection. Back pain unchanged, pain meds PTA clarified with primary service. No bleeding.  Objective: Vital signs in last 24 hours: Blood pressure 93/55, pulse 122, temperature 98.9 F (37.2 C), temperature source Oral, resp. rate 18, height 5\' 1"  (1.549 m), weight 180 lb 8.9 oz (81.9 kg), SpO2 99.00%.   Intake/Output from previous day: 06/01 0701 - 06/02 0700 In: 1230 [P.O.:480; IV Piggyback:750] Out: 1600 [Urine:1600] Intake/Output this shift: Total I/O In: 240 [P.O.:240] Out: -   Physical exam: awake, alert, lying flat and still but smiling. Rash at hairline and scalp resolved. PERRL, not icteric. Oral mucosa moist and clear. Lungs clear anteriorly. PAC accessed and infusing without difficulty. Abdomen soft, quiet, not tender. LE no edema, feet warm.  Lab Results:  Recent Labs  07/30/12 0500 07/31/12 0420  WBC 1.9* 2.1*  HGB 9.3* 7.8*  HCT 28.1* 23.6*  PLT 97* 110*  differential ordered  BMET  Recent Labs  07/30/12 0500 07/31/12 0420  NA 133* 133*  K 3.1* 2.9*  CL 103 101  CO2 21 23  GLUCOSE 112* 146*  BUN 6 6  CREATININE 0.68 0.78  CALCIUM 7.3* 7.0*   Urine cx pending. One of two peripheral blood cx with coag negative  staph Studies/Results: US Renal  07/29/2012   *RADIOLOGY REPORT*  Clinical Data: Right hydronephrosis  RENAL/URINARY TRACT ULTRASOUND COMPLETE  Comparison:  CT abdomen pelvis dated 07/29/2012  Findings:  Right Kidney:  Measures 13.0 cm.  Mild fullness of the renal pelvis.  This appearance is less conspicuous than on recent CT.  Left Kidney:  Poorly visualized/atrophic.  Measures 7.2 cm.  No gross hydronephrosis.  Bladder:  Within normal limits.  IMPRESSION: Mild fullness of the right renal pelvis.  The appearance is less conspicuous than on recent CT.   Original Report Authenticated By: Charline Bills, M.D.   CHEST - 2 VIEW 07-29-12 Comparison: 12/31/2011 CT  Findings: Hypoaeration. Linear left lung opacity, favor  atelectasis. Right chest wall port catheter tip projects over the  mid SVC. No pneumothorax or definite pleural effusion. Multilevel  degenerative changes. partially imaged surgical clips right upper  quadrant and lumbar vertebroplasty.  IMPRESSION:  Hypoaeration with linear left lung opacity, favor atelectasis.   Assessment/Plan: 1.Metastatic cervical cancer to liver, inguinal and periaortic nodes: day 11 cycle 1 Alimta. Continue folate, may need additional neupogen depending on rest of counts today. May need PRBCs if further drop in hemoglobin. 3.atrial fibrillation and recurrent LE DVT after IVC filter: on xarelto  4.IVC filter in  5.right inguinal lymphocele: gradually improved  6.atrophic left kidney  7. Hypokalemia: per hospitalist 8.PAC  9. Lumbar compression fractures post kyphoplasties with path negative for malignancy. 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 Prolia given 06-2012. Continue duragesic and prn pain medication.   LIVESAY,LENNIS P [phone]

## 2012-07-31 NOTE — Progress Notes (Signed)
PT Cancellation Note  Patient Details Name: [name] MRN: [id number] DOB: Nov 12, 1950   Cancelled Treatment:    Reason Eval/Treat Not Completed: Medical issues which prohibited therapy Elevated HR this morning ambulating to bathroom per RN.  RN also states MD would like to hold PT today.   [name],[name] 07/31/2012, 10:48 AM Zenovia Jarred, PT, DPT 07/31/2012 Pager: [phone]

## 2012-07-31 NOTE — Progress Notes (Signed)
While ambulating, patient pulse elevated to 163. Vital signs were stable BP 106/63. MD notified. Pt stated that she felt fine and was not dzzy. Pt. Placed in the chair and will call when ambulating.

## 2012-08-01 LAB — COMPREHENSIVE METABOLIC PANEL
Albumin: 2.1 g/dL — ABNORMAL LOW (ref 3.5–5.2)
BUN: 5 mg/dL — ABNORMAL LOW (ref 6–23)
Calcium: 6.9 mg/dL — ABNORMAL LOW (ref 8.4–10.5)
GFR calc Af Amer: >90 mL/min (ref >90)
Glucose, Bld: 138 mg/dL — ABNORMAL HIGH (ref 70–99)
Potassium: 3.4 mEq/L — ABNORMAL LOW (ref 3.5–5.1)
Total Protein: 5.5 g/dL — ABNORMAL LOW (ref 6.0–8.3)

## 2012-08-01 LAB — CULTURE, BLOOD (ROUTINE X 2)

## 2012-08-01 LAB — CBC WITH DIFFERENTIAL/PLATELET
Basophils Relative: 0 % (ref 0–1)
Eosinophils Absolute: 0.1 10*3/uL (ref 0.0–0.7)
Eosinophils Relative: 1 % (ref 0–5)
Hemoglobin: 7.8 g/dL — ABNORMAL LOW (ref 12.0–15.0)
Lymphs Abs: 0.4 10*3/uL — ABNORMAL LOW (ref 0.7–4.0)
MCH: 27.3 pg (ref 26.0–34.0)
MCHC: 33.8 g/dL (ref 30.0–36.0)
MCV: 80.8 fL (ref 78.0–100.0)
Monocytes Relative: 9 % (ref 3–12)
Neutrophils Relative %: 85 % — ABNORMAL HIGH (ref 43–77)
Platelets: 127 10*3/uL — ABNORMAL LOW (ref 150–400)
RBC: 2.86 MIL/uL — ABNORMAL LOW (ref 3.87–5.11)

## 2012-08-01 LAB — GLUCOSE, CAPILLARY: Glucose-Capillary: 90 mg/dL (ref 70–99)

## 2012-08-01 MED ORDER — POTASSIUM CHLORIDE CRYS ER 20 MEQ PO TBCR
40.0000 meq | EXTENDED_RELEASE_TABLET | Freq: Once | ORAL | Status: AC
  Administered 2012-08-01: 40 meq via ORAL
  Filled 2012-08-01: qty 2

## 2012-08-01 MED ORDER — METOPROLOL TARTRATE 50 MG PO TABS
50.0000 mg | ORAL_TABLET | Freq: Two times a day (BID) | ORAL | Status: DC
  Administered 2012-08-01 – 2012-08-02 (×2): 50 mg via ORAL
  Filled 2012-08-01 (×3): qty 1

## 2012-08-01 NOTE — Progress Notes (Signed)
Pt's heart rate is 133 and b/p 97/59. Merdis Delay, NP notified of pt's vital signs. Rechecked b/p manually and it was 98/60. Will cont to monitor.

## 2012-08-01 NOTE — Progress Notes (Signed)
Medical Oncology  Patient seen with significant other here. Feeling some better this pm, tho still weak overall, increased swelling LLE (where chronic swelling from previous DVTs and RT) since on IVF, some sweating. Appetite and po intake still poor and I have encouraged the Resource.  WBC and ANC up well with neupogen, which I have DCd now-- she is NOT neutropenic. No + cultures yet, still on empiric antibiotics. No bowel movement.  We have discussed PRBCs, as Hgb is 7.8, however transfusion this late tonight would keep her awake and she is exhausted. If Hgb <=7.8 by CBC in AM, I would recommend transfusing at least one unit PRBCs.  Afebrile, tachycardic, BP ok. Sitting on SOB, on RA. Slightly diaphoretic. Lungs without wheezes or rales. Heart irreg RR, no gallop. PAC ok. No skin rash. 2+ swelling LLE, none on right, no cords or tenderness.  1.Metastatic cervical cancer to liver, inguinal and periaortic nodes: day 11 cycle 1 Alimta. Continue folate, DC neupogen. May need PRBCs tomorrow. 3.atrial fibrillation and recurrent LE DVT after IVC filter: on xarelto  4.IVC filter in  5.right inguinal lymphocele: gradually improved  6.atrophic left kidney  7. Hypokalemia: per hospitalist  8.PAC  9. Lumbar compression fractures post kyphoplasties with path negative for malignancy. 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 Prolia given 06-2012. Continue duragesic and prn pain medication. 10. DVTs LLE with chronic swelling increased now with IVF.    Thank you and please call between my visits if I can be of help. Hopefully she can go home soon depending on results of cultures. See comments re PRBCs above.  L.Jaymi Tinner, MD [phone]

## 2012-08-01 NOTE — Progress Notes (Signed)
[facility] PROGRESS NOTE  [name]:[id number] DOB: Nov 29, 1950 DOA: 07/29/2012 PCP: [name], MD  Assessment/Plan: Neutropenic fever  - in the setting of metastatic cervical carcinoma and recent chemotherapy 07/21/2012 (alimta)  - we will initiate empiric cefepime  - blood culture and urine culture results so far only show contaminant - continue to monitor CBC   Severe protein calorie malnut   HYPERTENSION  - continue metoprolol 50 mg PO BID- may need a decrease in dose  Hypokalemia -replete -check Mg   Atrial fibrillation  - on xarelto  - HR increased to 115  - start home dose metoprolol   Abnormal liver enzymes  - secondary to liver mets   Cervical cancer  - management per oncology   DVT (deep venous thrombosis)  - on xarelto   Anemia of chronic disease  - secondary to history of malignancy   Thrombocytopenia  - likely sequela of chemothereapy   Hyponatremia  - 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  - continue to monitor BMP   Hypotension -IVF   Code Status: full Family Communication: patient at bedside Disposition Plan: home soon   Consultants:  none  Procedures:  none  Antibiotics:    HPI/Subjective: feeling better No SOB, no CP   Objective: Filed Vitals:   07/31/12 2221 08/01/12 0441 08/01/12 0500 08/01/12 0932  BP: 116/70 107/72  111/68  Pulse: 132 103  125  Temp:  98.6 F (37 C)    TempSrc:  Oral    Resp:  18    Height:      Weight:   81.5 kg (179 lb 10.8 oz)   SpO2:  100%      Intake/Output Summary (Last 24 hours) at 08/01/12 1034 Last data filed at 08/01/12 0700  Gross per 24 hour  Intake   1820 ml  Output   2100 ml  Net   -280 ml   Filed Weights   07/30/12 0536 07/31/12 0456 08/01/12 0500  Weight: 82.736 kg (182 lb 6.4 oz) 81.9 kg (180 lb 8.9 oz) 81.5 kg (179 lb 10.8 oz)    Exam:   General:  A+O x 3, NAD  Cardiovascular: rrr  Respiratory: clear anterior  Abdomen: +BS, soft, mild  tend  Musculoskeletal: moves all 4 ext   Data Reviewed: Basic Metabolic Panel:  Recent Labs Lab 07/29/12 0500 07/29/12 1207 07/30/12 0500 07/31/12 0420 08/01/12 0350  NA 125* 129* 133* 133* 136  K 3.6 3.2* 3.1* 2.9* 3.4*  CL 94* 98 103 101 103  CO2 20 21 21 23 22   GLUCOSE 116* 164* 112* 146* 138*  BUN 12 9 6 6  5*  CREATININE 0.81 0.73 0.68 0.78 0.64  CALCIUM 8.1* 7.6* 7.3* 7.0* 6.9*  MG  --  2.1  --  1.8  --   PHOS  --  0.7*  --   --   --    Liver Function Tests:  Recent Labs Lab 07/29/12 0500 07/29/12 1207 07/30/12 0500 08/01/12 0350  AST 56* 48* 99* 20  ALT 62* 58* 116* 50*  ALKPHOS 456* 415* 502* 383*  BILITOT 1.1 0.9 1.1 0.4  PROT 6.7 6.1 5.9* 5.5*  ALBUMIN 2.6* 2.4* 2.2* 2.1*    Recent Labs Lab 07/29/12 0500  LIPASE 7*   No results found for this basename: AMMONIA,  in the last 168 hours CBC:  Recent Labs Lab 07/28/12 1418 07/29/12 0500 07/29/12 1207 07/30/12 0500 07/31/12 0420 08/01/12 0350  WBC 2.0* 1.9* 1.9* 1.9* 2.1* 7.6  NEUTROABS  1.6 1.4* 1.5*  --  1.3* 6.5  HGB 10.8* 9.7* 9.3* 9.3* 7.8* 7.8*  HCT 32.4* 29.0* 27.3* 28.1* 23.6* 23.1*  MCV 80.3 78.2 79.1 79.8 79.5 80.8  PLT 169 121* 105* 97* 110* 127*   Cardiac Enzymes: No results found for this basename: CKTOTAL, CKMB, CKMBINDEX, TROPONINI,  in the last 168 hours BNP (last 3 results) No results found for this basename: PROBNP,  in the last 8760 hours CBG:  Recent Labs Lab 07/30/12 0745 07/31/12 0737 07/31/12 1631 08/01/12 0725  GLUCAP 104* 134* 120* 90    Recent Results (from the past 240 hour(s))  CULTURE, BLOOD (ROUTINE X 2)     Status: None   Collection Time    07/29/12  4:48 AM      Result Value Range Status   Specimen Description BLOOD LEFT HAND   Final   Special Requests BOTTLES DRAWN AEROBIC AND ANAEROBIC 5 CC EA   Final   Culture  Setup Time 07/29/2012 11:18   Final   Culture     Final   Value: BACILLUS SPECIES     Note: Standardized susceptibility testing for  this organism is not available.     STAPHYLOCOCCUS SPECIES (COAGULASE NEGATIVE)     Note: THE SIGNIFICANCE OF ISOLATING THIS ORGANISM FROM A SINGLE SET OF BLOOD CULTURES WHEN MULTIPLE SETS ARE DRAWN IS UNCERTAIN. PLEASE NOTIFY THE MICROBIOLOGY DEPARTMENT WITHIN ONE WEEK IF SPECIATION AND SENSITIVITIES ARE REQUIRED.     Note: Gram Stain Report Called to,Read Back By and Verified With: JENNY BURNS 07/29/12 @ 9:32PM BY RUSCA. Gram Stain Report Called to,Read Back By and Verified With: Mignon Pine RN on 07/30/12 at 06:04 by Christie Nottingham   Report Status PENDING   Incomplete  CULTURE, BLOOD (ROUTINE X 2)     Status: None   Collection Time    07/29/12  5:00 AM      Result Value Range Status   Specimen Description BLOOD RIGHT HAND   Final   Special Requests BOTTLES DRAWN AEROBIC AND ANAEROBIC 5 CC EA   Final   Culture  Setup Time 07/29/2012 11:18   Final   Culture     Final   Value:        BLOOD CULTURE RECEIVED NO GROWTH TO DATE CULTURE WILL BE HELD FOR 5 DAYS BEFORE ISSUING A FINAL NEGATIVE REPORT   Report Status PENDING   Incomplete  URINE CULTURE     Status: None   Collection Time    07/29/12 10:46 AM      Result Value Range Status   Specimen Description URINE, CLEAN CATCH   Final   Special Requests NONE   Final   Culture  Setup Time 07/29/2012 14:07   Final   Colony Count 35,000 COLONIES/ML   Final   Culture     Final   Value: Multiple bacterial morphotypes present, none predominant. Suggest appropriate recollection if clinically indicated.   Report Status 07/30/2012 FINAL   Final     Studies: No results found.  Scheduled Meds: . calcium-vitamin D  1 tablet Oral q morning - 10a  . ceFEPime (MAXIPIME) IV  2 g Intravenous Q8H  . feeding supplement  1 Container Oral TID BM  . fentaNYL  100 mcg Transdermal Q72H  . fentaNYL  25 mcg Transdermal Q72H  . filgrastim  300 mcg Subcutaneous q1800  . fluticasone  2 spray Each Nare Daily  . folic acid  1 mg Oral q morning - 10a  .  loratadine  10 mg  Oral Daily  . metoprolol  50 mg Oral BID  . multivitamin with minerals  1 tablet Oral q morning - 10a  . pantoprazole  40 mg Oral Daily  . phosphorus  500 mg Oral BID  . Rivaroxaban  20 mg Oral QPM  . sodium chloride  3 mL Intravenous Q12H  . tiZANidine  4 mg Oral TID   Continuous Infusions: . 0.9 % NaCl with KCl 20 mEq / L 75 mL/hr at 08/01/12 1610    Principal Problem:   Neutropenic fever Active Problems:   HYPERTENSION   Atrial fibrillation   Cervical cancer   DVT (deep venous thrombosis)   Anemia of chronic disease   Thrombocytopenia   Hyponatremia   Diarrhea   Protein-calorie malnutrition, severe    Time spent: 35    [facility], [name]  [facility] Pager [phone]. If 7PM-7AM, please contact night-coverage at [email or web address], password [facility] 08/01/2012, 10:34 AM  LOS: 3 days

## 2012-08-02 ENCOUNTER — Other Ambulatory Visit: Payer: Self-pay | Admitting: Oncology

## 2012-08-02 ENCOUNTER — Telehealth: Payer: Self-pay | Admitting: Oncology

## 2012-08-02 DIAGNOSIS — C539 Malignant neoplasm of cervix uteri, unspecified: Secondary | ICD-10-CM

## 2012-08-02 LAB — CBC WITH DIFFERENTIAL/PLATELET
Basophils Relative: 0 % (ref 0–1)
Eosinophils Absolute: 0.1 10*3/uL (ref 0.0–0.7)
Eosinophils Relative: 1 % (ref 0–5)
HCT: 23.6 % — ABNORMAL LOW (ref 36.0–46.0)
Hemoglobin: 7.7 g/dL — ABNORMAL LOW (ref 12.0–15.0)
Lymphocytes Relative: 4 % — ABNORMAL LOW (ref 12–46)
MCHC: 32.6 g/dL (ref 30.0–36.0)
Monocytes Relative: 8 % (ref 3–12)
Neutro Abs: 11.1 10*3/uL — ABNORMAL HIGH (ref 1.7–7.7)
Neutrophils Relative %: 87 % — ABNORMAL HIGH (ref 43–77)
RBC: 2.89 MIL/uL — ABNORMAL LOW (ref 3.87–5.11)
WBC: 12.7 10*3/uL — ABNORMAL HIGH (ref 4.0–10.5)

## 2012-08-02 LAB — BASIC METABOLIC PANEL
CO2: 23 mEq/L (ref 19–32)
Calcium: 6.9 mg/dL — ABNORMAL LOW (ref 8.4–10.5)
Potassium: 3.9 mEq/L (ref 3.5–5.1)
Sodium: 134 mEq/L — ABNORMAL LOW (ref 135–145)

## 2012-08-02 LAB — IRON AND TIBC
Iron: 19 ug/dL — ABNORMAL LOW (ref 42–135)
TIBC: 144 ug/dL — ABNORMAL LOW (ref 250–470)

## 2012-08-02 LAB — GLUCOSE, CAPILLARY: Glucose-Capillary: 113 mg/dL — ABNORMAL HIGH (ref 70–99)

## 2012-08-02 LAB — PREPARE RBC (CROSSMATCH)

## 2012-08-02 MED ORDER — GLYCERIN (LAXATIVE) 2.1 G RE SUPP
1.0000 | Freq: Every day | RECTAL | Status: DC | PRN
  Filled 2012-08-02: qty 1

## 2012-08-02 MED ORDER — POLYETHYLENE GLYCOL 3350 17 G PO PACK
17.0000 g | PACK | Freq: Once | ORAL | Status: DC
  Filled 2012-08-02: qty 1

## 2012-08-02 MED ORDER — ACETAMINOPHEN 325 MG PO TABS
650.0000 mg | ORAL_TABLET | Freq: Once | ORAL | Status: AC
  Administered 2012-08-02: 650 mg via ORAL
  Filled 2012-08-02: qty 2

## 2012-08-02 NOTE — Progress Notes (Signed)
[facility] PROGRESS NOTE  [name] YQM:[id number] DOB: 1950/06/16 DOA: 07/29/2012 PCP: [name], MD  Assessment/Plan: Principal Problem:   Neutropenic fever Active Problems:   HYPERTENSION   Atrial fibrillation   Cervical cancer   DVT (deep venous thrombosis)   Anemia of chronic disease   Thrombocytopenia   Hyponatremia   Diarrhea   Protein-calorie malnutrition, severe    1. Neutropenic fever: Patient presented with a temperature of 102, wcc 1.9, ANC 1.4, in the setting of chemotherapy for malignant disease, consistent with neutropenic fever. Septic work up was carried out, and she was commenced on iv Vancomycin and Cefepime. Blood and urine cultures remained negative, CXR was devoid of acute findings and abdominal/pelvic CT scan demonstrated new moderate right hydronephrosis. Clinically, she has shown steady improvement, with improvement of wcc/neutrophil count, which have normalized as of 08/01/12. Now on day# 5 antibiotics. Will likely switch to quinolone monotherapy from 08/03/12.  2. Malignant Disease: Patient has known metastatic cervical cancer to liver, inguinal and periaortic nodes, and is s/p day 13 cycle 1 alimta. Continue folate. Dr Jama Flavors provided oncology consultation. Managing as recommended.  3. Anemia/Thrombocytopenia: Patient has anemia of chronic disease/malignancy, and thrombocytopenia, which is likely sequela of chemotherapy. Transfusing 1 unit of PRBCs today, due to symptomatic anemia, per Dr Darrold Span. Also check iron studies and would consider feraheme if low. 4. HTN/Hypotension: Patient has known history of HTN, but at presentation, BP was low at 94/66. This responded promptly to iv fluids. As BOP started creeping up, pre-admission antihypertensives were reinstated. Today again, patient has low/borderline BP. Have discontinued antihypertensives for now.  5. Atrial fibrillation: Rate-controlled on beta-blocker.On Xarelto. As discussed in # 4,  have discontinued Metoprolol.   6. Hypokalemia: Repleting as indicated.  7. 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 Stable.  8. History of DVT (deep venous thrombosis):  Recurrent  LLE DVT, s/p IVC filter. On Xarelto. Not problematic.     Code Status: Full Code.  Family Communication:  Disposition Plan: T be determined.    Brief narrative: 62 year old female with history of migraines, atrial fibrillation, HTN, Glaucoma, previous LLE DVT, bilateral LE lymphedema, OA, GERD, CHF, cervical cancer on chemotherapy (Alimta) last dose received 07/21/2012, who presented to [facility] ED 07/29/2012 for evaluation of fever. Patient had spiked fever at home, of 102 F. She also reported an episode of diarrhea but no significant nausea or vomiting. No chest pain, no shortness of breath, no palpitations. No blood in stool or urine. No lightheadedness or loss of consciousness. In ED, BP was 94/66 but it responded IV fluids and now BP 113/57. HR was 63 - 136 range. O2 saturation was 96% while breathing ambient air. CBC revealed pancytopenia with WBC of 1.9, Hgb of 9.3 and platelet count of 105. BMP showed hypokalemia of 3.2 and hyponatremia of 129. Her INR was 2.39 (although she is not taking coumadin but Xarelto). CT abdomen revealed moderate right side hydronephrosis but no obstruction and patient does have urine output. Noted also were multiple liver metastases and metastatic cervical carcinoma. Admitted for further management.    Consultants:  Dr Jama Flavors, oncologist.   Procedures:  See notes.   Antibiotics:  Cefepime 07/29/12>>>  Vancomycin 07/29/12-07/31/12.  HPI/Subjective: Feels better.   Objective: Vital signs in last 24 hours: Temp:  [98 F (36.7 C)-98.5 F (36.9 C)] 98.2 F (36.8 C) (06/04 0630) Pulse Rate:  [94-133] 94 (06/04 0630) Resp:  [16-18] 16 (06/04 0630) BP: (91-98)/(57-61) 94/57 mmHg (06/04 0630) SpO2:  [  98 %-99 %] 99 % (06/04 0630) Weight:  [85.9 kg (189 lb 6 oz)] 85.9 kg  (189 lb 6 oz) (06/04 0630) Weight change: 4.4 kg (9 lb 11.2 oz) Last BM Date: 07/28/12  Intake/Output from previous day: 06/03 0701 - 06/04 0700 In: 1735 [P.O.:360; I.V.:1325; IV Piggyback:50] Out: -      Physical Exam: General: Comfortable, alert, communicative, fully oriented, not short of breath at rest.  HEENT:  Moderate clinical pallor, no jaundice, no conjunctival injection or discharge. NECK:  Supple, JVP not seen, no carotid bruits, no palpable lymphadenopathy, no palpable goiter. CHEST:  Clinically clear to auscultation, no wheezes, no crackles. HEART:  Sounds 1 and 2 heard, normal, irregular, no murmurs. ABDOMEN:  Full, soft, non-tender, no palpable organomegaly, no palpable masses, normal bowel sounds. GENITALIA:  Not examined. LOWER EXTREMITIES:  Mild pitting edema, palpable peripheral pulses. MUSCULOSKELETAL SYSTEM:  Generalized osteoarthritic changes, otherwise, normal. CENTRAL NERVOUS SYSTEM:  No focal neurologic deficit on gross examination.  Lab Results:  Recent Labs  08/01/12 0350 08/02/12 0440  WBC 7.6 12.7*  HGB 7.8* 7.7*  HCT 23.1* 23.6*  PLT 127* 140*    Recent Labs  08/01/12 0350 08/02/12 0440  NA 136 134*  K 3.4* 3.9  CL 103 103  CO2 22 23  GLUCOSE 138* 114*  BUN 5* 5*  CREATININE 0.64 0.62  CALCIUM 6.9* 6.9*   Recent Results (from the past 240 hour(s))  CULTURE, BLOOD (ROUTINE X 2)     Status: None   Collection Time    07/29/12  4:48 AM      Result Value Range Status   Specimen Description BLOOD LEFT HAND   Final   Special Requests BOTTLES DRAWN AEROBIC AND ANAEROBIC 5 CC EA   Final   Culture  Setup Time 07/29/2012 11:18   Final   Culture     Final   Value: BACILLUS SPECIES     Note: Standardized susceptibility testing for this organism is not available.     STAPHYLOCOCCUS SPECIES (COAGULASE NEGATIVE)     Note: This organism DOES NOT demonstrate inducible Clindamycin resistance in vitro.     Note: Gram Stain Report Called to,Read  Back By and Verified With: JENNY BURNS 07/29/12 @ 9:32PM BY RUSCA. Gram Stain Report Called to,Read Back By and Verified With: Mignon Pine RN on 07/30/12 at 06:04 by Christie Nottingham   Report Status 08/01/2012 FINAL   Final   Organism ID, Bacteria STAPHYLOCOCCUS SPECIES (COAGULASE NEGATIVE)   Final  CULTURE, BLOOD (ROUTINE X 2)     Status: None   Collection Time    07/29/12  5:00 AM      Result Value Range Status   Specimen Description BLOOD RIGHT HAND   Final   Special Requests BOTTLES DRAWN AEROBIC AND ANAEROBIC 5 CC EA   Final   Culture  Setup Time 07/29/2012 11:18   Final   Culture     Final   Value:        BLOOD CULTURE RECEIVED NO GROWTH TO DATE CULTURE WILL BE HELD FOR 5 DAYS BEFORE ISSUING A FINAL NEGATIVE REPORT   Report Status PENDING   Incomplete  URINE CULTURE     Status: None   Collection Time    07/29/12 10:46 AM      Result Value Range Status   Specimen Description URINE, CLEAN CATCH   Final   Special Requests NONE   Final   Culture  Setup Time 07/29/2012 14:07   Final  Colony Count 35,000 COLONIES/ML   Final   Culture     Final   Value: Multiple bacterial morphotypes present, none predominant. Suggest appropriate recollection if clinically indicated.   Report Status 07/30/2012 FINAL   Final     Studies/Results: No results found.  Medications: Scheduled Meds: . calcium-vitamin D  1 tablet Oral q morning - 10a  . ceFEPime (MAXIPIME) IV  2 g Intravenous Q8H  . feeding supplement  1 Container Oral TID BM  . fentaNYL  100 mcg Transdermal Q72H  . fentaNYL  25 mcg Transdermal Q72H  . fluticasone  2 spray Each Nare Daily  . folic acid  1 mg Oral q morning - 10a  . loratadine  10 mg Oral Daily  . metoprolol  50 mg Oral BID  . multivitamin with minerals  1 tablet Oral q morning - 10a  . pantoprazole  40 mg Oral Daily  . phosphorus  500 mg Oral BID  . polyethylene glycol  17 g Oral Once  . Rivaroxaban  20 mg Oral QPM  . sodium chloride  3 mL Intravenous Q12H  . tiZANidine   4 mg Oral TID   Continuous Infusions: . 0.9 % NaCl with KCl 20 mEq / L 50 mL/hr at 08/01/12 2255   PRN Meds:.acetaminophen, acetaminophen, bisacodyl, Glycerin (Adult), HYDROcodone-acetaminophen, HYDROmorphone (DILAUDID) injection, LORazepam, ondansetron (ZOFRAN) IV, ondansetron, polyethylene glycol, sodium chloride, sodium chloride, zolpidem    LOS: 4 days   Dinia Joynt,CHRISTOPHER  [facility] Pager [phone]. If 8PM-8AM, please contact night-coverage at [email or web address], password [facility] 08/02/2012, 12:49 PM  LOS: 4 days

## 2012-08-02 NOTE — Telephone Encounter (Signed)
s.w. pt and advised on 6.17.14 appt...mailed pt appt sched and letter.Marland KitchenMarland Kitchen

## 2012-08-02 NOTE — Care Management Note (Addendum)
    Page 1 of 1   08/05/2012     4:47:07 PM   CARE MANAGEMENT NOTE 08/05/2012  Patient:  [name], [name]   Account Number:  [id number]  Date Initiated:  07/30/2012  Documentation initiated by:  [facility]  Subjective/Objective Assessment:   62 year old female with hx cervical cancer admitted with neitropenic fever.     Action/Plan:   From home.   Anticipated DC Date:  08/04/2012   Anticipated DC Plan:  HOME/SELF CARE      DC Planning Services  CM consult      Choice offered to / List presented to:             Status of service:  Completed, signed off Medicare Important Message given?  NA - LOS <3 / Initial given by admissions (If response is "NO", the following Medicare IM given date fields will be blank) Date Medicare IM given:   Date Additional Medicare IM given:    Discharge Disposition:  HOME/SELF CARE  Per UR Regulation:  Reviewed for med. necessity/level of care/duration of stay  If discussed at Long Length of Stay Meetings, dates discussed:    Comments:  08/05/12 Hawthorne Day RN,BSN NCM WEEKEND [phone] D/C YESTERDAY 08/04/12 NO D/C HH/DME NEEDS.  08/02/12 Sukaina Toothaker RN,BSN NCM [phone] LLE SWELLING-HX:DVT,TACHY,AFIB,S/P 1U PRBC-HGB 7.7.WBC ELEVATED.IV ABX.

## 2012-08-02 NOTE — Progress Notes (Signed)
PT Cancellation Note  Patient Details Name: [name] MRN: [id number] DOB: [birth year]   Cancelled Treatment:    Reason Eval/Treat Not Completed: Medical issues which prohibited therapy, to get blood, tachycardia, decreased BP.   Rada Hay 08/02/2012, 10:12 AM Blanchard Kelch PT [phone]

## 2012-08-02 NOTE — Progress Notes (Signed)
  08/02/2012, 8:29 AM  Hospital day: 5 Antibiotics: maxipime Chemotherapy: #1 Alimta 07-21-12  Patient seen alone for visit this AM  Subjective: Slept very well part of night. Still drenching sweats at times, tho remains afebrile. Back pain tolerable tho she is very careful with activity. No BM since admission, will take miralax this AM. Increasing swelling LLE with hydration, this a chronic problem with prior DVTs/ old injury/ surgery and RT -- will try Teds if able to tolerate, tho she doubts she can get these on at home. Only occasional drainage now from right groin lymphocele. Notices tachycardia at times. Feels thirsty, is drinking fluids regularly  Objective: Vital signs in last 24 hours: Blood pressure 94/57, pulse 94, temperature 98.2 F (36.8 C), temperature source Oral, resp. rate 16, height 5' 1.5" (1.562 m), weight 189 lb 6 oz (85.9 kg), SpO2 99.00%.   Intake/Output from previous day: 06/03 0701 - 06/04 0700 In: 1735 [P.O.:360; I.V.:1325; IV Piggyback:50] Out: -  Intake/Output this shift:    Physical exam: awake, alert, looks generally comfortable flat in bed on RA. Diaphoretic, hair soaked. Oral mucosa clear. PAC site ok, IV at 50. Heart tachy, irreg with A fib. Abdomen full, soft, quiet, not tender. 1-2+ swelling LLE to thigh, tho not tight in lower leg as is often the case. Minimal light drainage on gauze right inguinal.Moves all extremities easily  Lab Results:  Recent Labs  08/01/12 0350 08/02/12 0440  WBC 7.6 12.7*  HGB 7.8* 7.7*  HCT 23.1* 23.6*  PLT 127* 140*   With platelets recovered she is past nadir on counts from first Alimta. Will check iron studies, as IV iron may be useful She agrees to PRBCs, which may improve the general weakness and significant tachycardia.  BMET  Recent Labs  08/01/12 0350 08/02/12 0440  NA 136 134*  K 3.4* 3.9  CL 103 103  CO2 22 23  GLUCOSE 138* 114*  BUN 5* 5*  CREATININE 0.64 0.62  CALCIUM 6.9* 6.9*     Studies/Results: 1 or 2 peripheral blood cultures with what may be contaminant, the other blood cx not yet final.  Urine cx negative   Medications: still on lopressor 50 bid. Has tolerated oral iron poorly. 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 Continue folate. Transfuse 1 unit of PRBCs due to symptomatic anemia, also check iron studies and would consider feraheme if low. 2.atrial fibrillation: still rapid rate and BPs lower, on beta blocker. She is followed by Dr Tommie Sams for cardiology 3.fever on admission: continuing antibiotics, following cultures. The sweats are new in ~ past 1-2 weeks, ? Tumor or infection or other 4.IVC filter in. 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 Post kyphoplasty x 2, somewhat helpful. Known to Dr Ethelene Hal. On duragesic patch particularly for associated low back pain 6.HypoK improved 7.PAC in 8.atrophic left kidney 9. Chronic swelling LLE worse with rehydration   Kenry Daubert P [phone]

## 2012-08-03 LAB — CBC WITH DIFFERENTIAL/PLATELET
Eosinophils Relative: 1 % (ref 0–5)
HCT: 26 % — ABNORMAL LOW (ref 36.0–46.0)
Lymphocytes Relative: 6 % — ABNORMAL LOW (ref 12–46)
Lymphs Abs: 0.5 10*3/uL — ABNORMAL LOW (ref 0.7–4.0)
MCH: 26.8 pg (ref 26.0–34.0)
MCV: 83.1 fL (ref 78.0–100.0)
Monocytes Absolute: 0.6 10*3/uL (ref 0.1–1.0)
RBC: 3.13 MIL/uL — ABNORMAL LOW (ref 3.87–5.11)
WBC: 7.2 10*3/uL (ref 4.0–10.5)

## 2012-08-03 LAB — BASIC METABOLIC PANEL
BUN: 4 mg/dL — ABNORMAL LOW (ref 6–23)
CO2: 24 mEq/L (ref 19–32)
Calcium: 7.1 mg/dL — ABNORMAL LOW (ref 8.4–10.5)
Creatinine, Ser: 0.59 mg/dL (ref 0.50–1.10)
Glucose, Bld: 122 mg/dL — ABNORMAL HIGH (ref 70–99)

## 2012-08-03 MED ORDER — LEVOFLOXACIN 750 MG PO TABS
750.0000 mg | ORAL_TABLET | Freq: Every day | ORAL | Status: DC
  Administered 2012-08-03 – 2012-08-04 (×2): 750 mg via ORAL
  Filled 2012-08-03 (×2): qty 1

## 2012-08-03 MED ORDER — UNJURY CHICKEN SOUP POWDER
2.0000 [oz_av] | Freq: Four times a day (QID) | ORAL | Status: DC
  Administered 2012-08-03 – 2012-08-04 (×3): 2 [oz_av] via ORAL
  Filled 2012-08-03 (×7): qty 27

## 2012-08-03 MED ORDER — POTASSIUM CHLORIDE IN NACL 20-0.9 MEQ/L-% IV SOLN
INTRAVENOUS | Status: DC
  Filled 2012-08-03: qty 1000

## 2012-08-03 MED ORDER — FERROUS FUMARATE 325 (106 FE) MG PO TABS
1.0000 | ORAL_TABLET | Freq: Every day | ORAL | Status: DC
  Administered 2012-08-03 – 2012-08-04 (×2): 106 mg via ORAL
  Filled 2012-08-03 (×2): qty 1

## 2012-08-03 MED ORDER — METOPROLOL TARTRATE 25 MG PO TABS
25.0000 mg | ORAL_TABLET | Freq: Two times a day (BID) | ORAL | Status: DC
  Administered 2012-08-03 – 2012-08-04 (×3): 25 mg via ORAL
  Filled 2012-08-03 (×4): qty 1

## 2012-08-03 MED ORDER — BOOST / RESOURCE BREEZE PO LIQD
1.0000 | Freq: Two times a day (BID) | ORAL | Status: DC
  Administered 2012-08-04 (×2): 1 via ORAL

## 2012-08-03 MED ORDER — ENSURE PUDDING PO PUDG
1.0000 | ORAL | Status: DC
  Administered 2012-08-03: 1 via ORAL
  Filled 2012-08-03 (×2): qty 1

## 2012-08-03 NOTE — Progress Notes (Signed)
NUTRITION FOLLOW UP  Intervention:   Continue Resource Breeze po BID, each supplement provides 250 kcal and 9 grams of protein. Provide Unjury supplement once daily Provide Ensure Pudding once daily   Nutrition Dx:   Inadequate oral intake related to chemotherapy treatment as evidenced by 50% intake of meals.    Goal:   Pt to meet >/= 90% of their estimated nutrition needs  Monitor:   PO intake; 50% of most meals Weight; 9 lb wt gain from 6/1 to 6/5 Labs; hemoglobin, low iron, low BUN, low calcium  Assessment:   Pt reports that her appetite continues to be poor, primarily due to pain and taste changes. Pt states that food tastes different and sweet things taste too sweet. Discussed diet tips for taste changes and alternative nutritional supplement options.  Height: Ht Readings from Last 1 Encounters:  08/02/12 5' 1.5" (1.562 m)    Weight Status:   Wt Readings from Last 1 Encounters:  08/03/12 191 lb 9.3 oz (86.9 kg)    Re-estimated needs:  Kcal: 1900-2100 kcal  Protein: 105-120 g  Fluid: 2.5 L  Skin: incision right groin  Diet Order: General   Intake/Output Summary (Last 24 hours) at 08/03/12 1535 Last data filed at 08/03/12 1300  Gross per 24 hour  Intake 2394.17 ml  Output   3325 ml  Net -930.83 ml    Last BM: 6/4   Labs:   Recent Labs Lab 07/29/12 0500 07/29/12 1207  07/31/12 0420 08/01/12 0350 08/02/12 0440 08/03/12 0430  NA 125* 129*  < > 133* 136 134* 135  K 3.6 3.2*  < > 2.9* 3.4* 3.9 3.5  CL 94* 98  < > 101 103 103 103  CO2 20 21  < > 23 22 23 24   BUN 12 9  < > 6 5* 5* 4*  CREATININE 0.81 0.73  < > 0.78 0.64 0.62 0.59  CALCIUM 8.1* 7.6*  < > 7.0* 6.9* 6.9* 7.1*  MG  --  2.1  --  1.8  --   --   --   PHOS  --  0.7*  --   --   --   --   --   GLUCOSE 116* 164*  < > 146* 138* 114* 122*  < > = values in this interval not displayed.  CBG (last 3)   Recent Labs  08/01/12 0725 08/02/12 0814 08/03/12 0752  GLUCAP 90 113* 96     Scheduled Meds: . calcium-vitamin D  1 tablet Oral q morning - 10a  . feeding supplement  1 Container Oral TID BM  . fentaNYL  100 mcg Transdermal Q72H  . fentaNYL  25 mcg Transdermal Q72H  . ferrous fumarate  1 tablet Oral Daily  . fluticasone  2 spray Each Nare Daily  . folic acid  1 mg Oral q morning - 10a  . levofloxacin  750 mg Oral Daily  . loratadine  10 mg Oral Daily  . metoprolol tartrate  25 mg Oral BID  . multivitamin with minerals  1 tablet Oral q morning - 10a  . pantoprazole  40 mg Oral Daily  . phosphorus  500 mg Oral BID  . polyethylene glycol  17 g Oral Once  . Rivaroxaban  20 mg Oral QPM  . sodium chloride  3 mL Intravenous Q12H  . tiZANidine  4 mg Oral TID    Continuous Infusions: . 0.9 % NaCl with KCl 20 mEq / L 20 mL/hr (08/03/12 0830)  Pryor Ochoa RD, LDN Inpatient Clinical Dietitian Pager: [phone] After Hours Pager: [phone]

## 2012-08-03 NOTE — Evaluation (Signed)
Physical Therapy Evaluation Patient Details Name: [name] MRN: [id number] DOB: August 26, 1950 Today's Date: 08/03/2012 Time: 9147-8295 PT Time Calculation (min): 17 min  PT Assessment / Plan / Recommendation Clinical Impression  62 yo female admitted with neutropenic fever, ;HTN, Afib. Hx of compression fracutres and vertebroplasty 04/2012. On eval, pt required Min guard to Min assist for mobility-able to ambulate ~135 feet while pushing IV pole. Pt very sweaty in bed and during session-RN made aware-assisted pt back to bed and applied cold washcloths. Will follow during stay but do not anticipate any PT needs after discharge.      PT Assessment  Patient needs continued PT services    Follow Up Recommendations  No PT follow up    Does the patient have the potential to tolerate intense rehabilitation      Barriers to Discharge        Equipment Recommendations  None recommended by PT    Recommendations for Other Services     Frequency Min 3X/week    Precautions / Restrictions Precautions Precautions: Fall Restrictions Weight Bearing Restrictions: No   Pertinent Vitals/Pain  back-7/10      Mobility  Bed Mobility Bed Mobility: Supine to Sit;Sit to Supine Supine to Sit: 4: Min assist;HOB elevated Sit to Supine: 4: Min guard Details for Bed Mobility Assistance: Pt used husband's hand to raise to sitting. Explained to pt that rolling for bed mobility is best practice-pt states she has been doing this Transfers Transfers: Sit to Stand;Stand to Sit Sit to Stand: 4: Min guard;From bed;From toilet Stand to Sit: 4: Min guard;To toilet;To bed Ambulation/Gait Ambulation/Gait Assistance: 4: Min guard Ambulation Distance (Feet): 135 Feet Ambulation/Gait Assistance Details: Pt pushed IV pole. slow gait speed. fatigues fairly easily.  Gait Pattern: Step-through pattern;Decreased stride length    Exercises     PT Diagnosis: Difficulty walking;Generalized weakness  PT Problem  List: Decreased mobility;Decreased activity tolerance;Pain PT Treatment Interventions: Gait training;Functional mobility training;Therapeutic activities;Therapeutic exercise;Patient/family education;Stair training   PT Goals Acute Rehab PT Goals PT Goal Formulation: With patient Time For Goal Achievement: 08/10/12 Potential to Achieve Goals: Good Pt will go Supine/Side to Sit: with supervision PT Goal: Supine/Side to Sit - Progress: Goal set today Pt will go Sit to Supine/Side: with supervision PT Goal: Sit to Supine/Side - Progress: Goal set today Pt will go Sit to Stand: with supervision PT Goal: Sit to Stand - Progress: Goal set today Pt will Ambulate: 51 - 150 feet;with supervision;with least restrictive assistive device PT Goal: Ambulate - Progress: Goal set today Pt will Go Up / Down Stairs: 1-2 stairs;with min assist;with rail(s) PT Goal: Up/Down Stairs - Progress: Goal set today  Visit Information  Last PT Received On: 08/03/12 Assistance Needed: +1    Subjective Data  Subjective: Ive actualy been a little better (pain control) since being here Patient Stated Goal: home tomorrow   Prior Functioning  Home Living Lives With: Spouse Available Help at Discharge: Family Type of Home: House Home Access: Stairs to enter [employer] of Steps: 2 Entrance Stairs-Rails: Right Home Adaptive Equipment: Walker - rolling;Bedside commode/3-in-1;Wheelchair - manual Prior Function Level of Independence: Needs assistance Able to Take Stairs?: Yes Comments: uses RW in home to ambulate normally. pt states someone is usually with her when negotiating steps Communication Communication: No difficulties    Cognition  Cognition Arousal/Alertness: Awake/Stone Behavior During Therapy: WFL for tasks assessed/performed Overall Cognitive Status: Within Functional Limits for tasks assessed    Extremity/Trunk Assessment Right Lower Extremity Assessment  RLE ROM/Strength/Tone: [facility]  for tasks assessed Left Lower Extremity Assessment LLE ROM/Strength/Tone: [facility] for tasks assessed Trunk Assessment Trunk Assessment: Normal   Balance    End of Session PT - End of Session Activity Tolerance: Patient tolerated treatment well Patient left: in bed;with call bell/phone within reach;with bed alarm set  GP     [name], MPT Pager: [phone]

## 2012-08-03 NOTE — Discharge Summary (Signed)
Physician Discharge Summary  [name]:[id number] DOB: 1950-12-12 DOA: 07/29/2012  PCP: [name], MD  Admit date: 07/29/2012 Discharge date: 08/04/2012  Time spent: 40 minutes  Recommendations for Outpatient Follow-up:  1. Follow up with PMD. 2. Follow up with Dr Jama Flavors, primary oncologist.  3. Follow up with dr Hillis Range, primary cardiologist.   Discharge Diagnoses:  Principal Problem:   Neutropenic fever Active Problems:   HYPERTENSION   Atrial fibrillation   Cervical cancer   DVT (deep venous thrombosis)   Anemia of chronic disease   Thrombocytopenia   Hyponatremia   Diarrhea   Protein-calorie malnutrition, severe   Discharge Condition: Satisfactory.  Diet recommendation: Heart-Healthy.   Filed Weights   08/01/12 0500 08/02/12 0630 08/03/12 0356  Weight: 81.5 kg (179 lb 10.8 oz) 85.9 kg (189 lb 6 oz) 86.9 kg (191 lb 9.3 oz)    History of present illness:  62 year old female with history of migraines, atrial fibrillation, HTN, Glaucoma, previous LLE DVT, bilateral LE lymphedema, OA, GERD, CHF, cervical cancer on chemotherapy (Alimta) last dose received 07/21/2012, who presented to [facility] ED 07/29/2012 for evaluation of fever. Patient had spiked fever at home, of 102 F. She also reported an episode of diarrhea but no significant nausea or vomiting. No chest pain, no shortness of breath, no palpitations. No blood in stool or urine. No lightheadedness or loss of consciousness. In ED, BP was 94/66 but it responded IV fluids and now BP 113/57. HR was 63 - 136 range. O2 saturation was 96% while breathing ambient air. CBC revealed pancytopenia with WBC of 1.9, Hgb of 9.3 and platelet count of 105. BMP showed hypokalemia of 3.2 and hyponatremia of 129. Her INR was 2.39 (although she is not taking coumadin but Xarelto). CT abdomen revealed moderate right side hydronephrosis but no obstruction and patient does have urine output. Noted also were multiple liver  metastases and metastatic cervical carcinoma. Admitted for further management.    Hospital Course:  1. Neutropenic fever: Patient presented with a temperature of 102, wcc 1.9, ANC 1.4, in the setting of chemotherapy for malignant disease, consistent with neutropenic fever. Septic work up was carried out, and she was commenced on iv Vancomycin and Cefepime. Urine culture revealed insignificant growth, while blood culture grew Methicillin-sensitive coag negative staph in 1:3 bottles. CXR was devoid of acute findings and abdominal/pelvic CT scan demonstrated new moderate right hydronephrosis. Clinically, she defervesced, and showed steady improvement in wcc/neutrophil count, which normalized as of 08/01/12. Switched to Levaquin monotherapy from 08/03/12, based on sensitivities and for potential pseudomonas cover. Antibiotics will be concluded on 08/07/12.  2. Malignant Disease: Patient has known metastatic cervical cancer to liver, inguinal and periaortic nodes, and is s/p day 13 cycle 1 alimta. Continue folate. Dr Jama Flavors provided oncology consultation, and patient was managed as recommended. Dr Darrold Span will see patient in office on 08/15/12.  3. Anemia/Thrombocytopenia: Patient has anemia of chronic disease/malignancy, and thrombocytopenia, which is likely sequela of chemotherapy. Transfused 1 unit of PRBCs on 08/02/12, due to symptomatic anemia, per Dr Darrold Span. Iron studies revealed iron deficiency. Per hematologist, Duncan Dull may be a consideration, in due course.  4. HTN/Hypotension: Patient has known history of HTN, but at presentation, BP was low at 94/66. This responded promptly to iv fluids. As BP started creeping up, pre-admission antihypertensives were reinstated, but on 08/02/12, patient once again, had low/borderline BP. All antihypertensives were dicontinued on that date. Patient had episodes of tachycardia overnight on 08/02/12, so Lopressor  was restarted at 25 mg BID (ie, half pre-admission dose), and  was fortunately, tolerated. Increased to 50 mg BID (pre-admission dose) on 08/04/12.  5. Atrial fibrillation: Initially rate-controlled on beta-blocker.On Xarelto. As discussed in # 4, discontinued Metoprolol, on 08/02/12 due to low BP, but have reinstated on 08/04/12, due to tachycardia the previous night. Now once again controlled.  6. Hypokalemia: Repleted as indicated.  7. 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 Stable.  8. History of DVT (deep venous thrombosis): Recurrent LLE DVT, s/p IVC filter. On Xarelto. Not problematic.    Procedures:  See Below.   Consultations:  Dr Jama Flavors, Hematologist/Oncologist.   Discharge Exam: Filed Vitals:   08/03/12 1357 08/03/12 2220 08/04/12 0545 08/04/12 1347  BP: 100/75 108/59 94/65 143/90  Pulse: 103 112 118 115  Temp: 98 F (36.7 C) 98.6 F (37 C) 97.7 F (36.5 C) 97.9 F (36.6 C)  TempSrc: Oral Oral Oral Oral  Resp: 18 20 20 18   Height:      Weight:      SpO2: 100% 98% 100% 99%    General: Comfortable, alert, communicative, fully oriented, not short of breath at rest.  HEENT: Moderate clinical pallor, no jaundice, no conjunctival injection or discharge.  NECK: Supple, JVP not seen, no carotid bruits, no palpable lymphadenopathy, no palpable goiter.  CHEST: Clinically clear to auscultation, no wheezes, no crackles.  HEART: Sounds 1 and 2 heard, normal, irregular, no murmurs.  ABDOMEN: Full, soft, non-tender, no palpable organomegaly, no palpable masses, normal bowel sounds.  GENITALIA: Not examined.  LOWER EXTREMITIES: Mild pitting edema, palpable peripheral pulses.  MUSCULOSKELETAL SYSTEM: Generalized osteoarthritic changes, otherwise, normal.  CENTRAL NERVOUS SYSTEM: No focal neurologic deficit on gross examination.  Discharge Instructions      Discharge Orders   Future Appointments Provider Department Dept Phone   08/15/2012 1:15 PM Sherrie Mustache [facility] [phone]   08/15/2012  1:30 PM [facility] Nurse [place] [facility] [phone]   08/15/2012 2:00 PM Reece Packer, MD [place] [facility] [phone]   Future Orders Complete By Expires     Diet general  As directed     Increase activity slowly  As directed         Medication List    TAKE these medications       bisacodyl 5 MG EC tablet  Commonly known as:  DULCOLAX  Take 5 mg by mouth daily as needed for constipation.     calcium-vitamin D 500-200 MG-UNIT per tablet  Commonly known as:  OSCAL WITH D  Take 1 tablet by mouth every morning.     cetirizine 10 MG tablet  Commonly known as:  ZYRTEC  Take 10 mg by mouth every evening.     dexamethasone 4 MG tablet  Commonly known as:  DECADRON  Take 4 mg by mouth 2 (two) times daily with a meal. Take the day before, day of, and day after chemo     feeding supplement Pudg  Take 1 Container by mouth daily.     feeding supplement Liqd  Take 1 Container by mouth 2 (two) times daily between meals.     fentaNYL 100 MCG/HR  Commonly known as:  DURAGESIC - dosed mcg/hr  Place 1 patch onto the skin every 3 (three) days. Total dose 125mg      fentaNYL 25 MCG/HR  Commonly known as:  DURAGESIC - dosed mcg/hr  Place 1 patch onto the skin every  3 (three) days. Total dose of 125 mcg     ferrous fumarate 325 (106 FE) MG Tabs  Commonly known as:  HEMOCYTE - 106 mg FE  Take 1 tablet (106 mg of iron total) by mouth daily.     folic acid 1 MG tablet  Commonly known as:  FOLVITE  Take 1 mg by mouth every morning.     HYDROmorphone 4 MG tablet  Commonly known as:  DILAUDID  Take 1 tablet every 4 hours as needed for pain     ibuprofen 200 MG tablet  Commonly known as:  ADVIL,MOTRIN  Take 600 mg by mouth 3 (three) times daily as needed for pain. Pain     levofloxacin 750 MG tablet  Commonly known as:  LEVAQUIN  Take 1 tablet (750 mg total) by mouth daily.     lidocaine-prilocaine cream  Commonly  known as:  EMLA  Apply 1 application topically daily as needed (for port access).     LORazepam 1 MG tablet  Commonly known as:  ATIVAN  Take 1 mg by mouth every 6 (six) hours as needed (for nausea (may put under tongue)).     metoprolol 50 MG tablet  Commonly known as:  LOPRESSOR  Take 1 tablet (50 mg total) by mouth 2 (two) times daily.     multivitamin with minerals Tabs  Take 1 tablet by mouth every morning.     omeprazole 20 MG capsule  Commonly known as:  PRILOSEC  Take 20 mg by mouth every morning.     ondansetron 8 MG tablet  Commonly known as:  ZOFRAN  Take by mouth every 8 (eight) hours as needed for nausea.     phosphorus 155-852-130 MG tablet  Commonly known as:  K PHOS NEUTRAL  Take 2 tablets by mouth 2 (two) times daily.     polyethylene glycol packet  Commonly known as:  MIRALAX / GLYCOLAX  Take 17 g by mouth 2 (two) times daily as needed (constipation).     protein supplement Powd  Commonly known as:  UNJURY CHICKEN SOUP  Take 7 g (2 oz total) by mouth 4 (four) times daily.     Rivaroxaban 20 MG Tabs  Commonly known as:  XARELTO  Take 20 mg by mouth every evening.     sodium chloride 0.65 % nasal spray  Commonly known as:  OCEAN  Place 1 spray into the nose daily as needed for congestion. Allergies     tiZANidine 4 MG capsule  Commonly known as:  ZANAFLEX  Take 1 capsule (4 mg total) by mouth 3 (three) times daily.     triamcinolone 55 MCG/ACT nasal inhaler  Commonly known as:  NASACORT  Place 2 sprays into the nose daily as needed (allergies or congestion).     zolpidem 5 MG tablet  Commonly known as:  AMBIEN  Take 1 tablet (5 mg total) by mouth at bedtime as needed for sleep.       Allergies  Allergen Reactions  . Codeine Rash and Other (See Comments)    Breathing issues  . Flecainide Acetate Other (See Comments)    Doesn't remember reaction  . Morphine Other (See Comments)     Hallucinations  . Propafenone Hcl Other (See Comments)     Leg cramps   Follow-up Information   Schedule an appointment as soon as possible for a visit with [name], MD.   Contact information:   [address]  [address] [place] [address] [phone]       Schedule an appointment as soon as possible for a visit with Reece Packer, MD.   Contact information:   [address] [place] [address] [phone]       Schedule an appointment as soon as possible for a visit with Hillis Range, MD.   Contact information:   [address] [place] [address] [phone]        The results of significant diagnostics from this hospitalization (including imaging, microbiology, ancillary and laboratory) are listed below for reference.    Significant Diagnostic Studies: Dg Chest 2 View  07/29/2012   *RADIOLOGY REPORT*  Clinical Data: Fever, weakness.  CHEST - 2 VIEW  Comparison: 12/31/2011 CT  Findings:  Hypoaeration.  Linear left lung opacity, favor atelectasis.  Right chest wall port catheter tip projects over the mid SVC.  No pneumothorax or definite pleural effusion.  Multilevel degenerative changes. partially imaged surgical clips right upper quadrant and lumbar vertebroplasty.  IMPRESSION: Hypoaeration with linear left lung opacity, favor atelectasis.   Original Report Authenticated By: Jearld Lesch, M.D.   Ct Abdomen Pelvis W Contrast  07/29/2012   *RADIOLOGY REPORT*  Clinical Data: .  Fever.  History of metastatic cervical carcinoma.  CT ABDOMEN AND PELVIS WITH CONTRAST  Technique:  Multidetector CT imaging of the abdomen and pelvis was performed following the standard protocol during bolus administration of intravenous contrast.  Contrast: 50mL OMNIPAQUE IOHEXOL 300 MG/ML  SOLN, OMNIPAQUE IOHEXOL 300 MG/ML  SOLN  Comparison: 07/04/2012  Findings: There is subsegmental atelectasis at the lung bases.  No pulmonary nodules or pleural effusion.  The heart is mildly enlarged.  Multiple  liver metastatic lesions are noted unchanged from the recent prior study.  The spleen is unremarkable.  The gallbladder is surgically absent.  No bile duct dilation.  The pancreas is partly fatty replaced.  There is some stranding in the peripancreatic and peri celiac fat pad is mildly increased from prior exam.  Consider a mild pancreatitis in the proper clinical setting.  No adrenal masses.  Marked atrophy of the left kidney with a small lower pole presumed cyst, stable.  There is moderate right hydronephrosis that has developed since the prior exam.  There is mild to moderate dilation of the right ureter.  The bladder distended.  No bladder masses seen.  There is no renal or ureteral stones.  The right ureter is decompressed in the pelvis.  The exact cause of the hydronephrosis is unclear.  Left periaortic adenopathy is stable.  There is a fluid density left external iliac lesion that is also unchanged from the recent prior exam.  There are changes from hysterectomy.  No pelvic masses.  There is no ascites.  The bowel is unremarkable.  A normal appendix is visualized.  Multiple lumbar compression fractures are noted to which had been treated with a previous vertebroplasty.  These are stable.  No osteoblastic or osteolytic lesions.  IMPRESSION: New moderate right hydronephrosis.  The etiology of this is unclear.  The right proximal mid ureter is mildly dilated but is decompressed distally.  No stone is seen.  No masses is seen obstructing the ureter or within the bladder.  Mild fat stranding adjacent to the pancreas has increased from prior exam.  This also tracks along the left lateral coronal fascia and anterior pararenal fascia.  This is nonspecific.  Consider mild pancreatitis in the proper clinical setting.  No other  evidence of an acute abnormality.  Metastatic disease from cervical carcinoma involving the left periaortic lymph nodes, a left external iliac chain presumed lymph node and multiple liver  lesions.  This is stable from the recent prior exam.   Original Report Authenticated By: Amie Portland, M.D.   US Renal  07/29/2012   *RADIOLOGY REPORT*  Clinical Data: Right hydronephrosis  RENAL/URINARY TRACT ULTRASOUND COMPLETE  Comparison:  CT abdomen pelvis dated 07/29/2012  Findings:  Right Kidney:  Measures 13.0 cm.  Mild fullness of the renal pelvis.  This appearance is less conspicuous than on recent CT.  Left Kidney:  Poorly visualized/atrophic.  Measures 7.2 cm.  No gross hydronephrosis.  Bladder:  Within normal limits.  IMPRESSION: Mild fullness of the right renal pelvis.  The appearance is less conspicuous than on recent CT.   Original Report Authenticated By: Charline Bills, M.D.    Microbiology: Recent Results (from the past 240 hour(s))  CULTURE, BLOOD (ROUTINE X 2)     Status: None   Collection Time    07/29/12  4:48 AM      Result Value Range Status   Specimen Description BLOOD LEFT HAND   Final   Special Requests BOTTLES DRAWN AEROBIC AND ANAEROBIC 5 CC EA   Final   Culture  Setup Time 07/29/2012 11:18   Final   Culture     Final   Value: BACILLUS SPECIES     Note: Standardized susceptibility testing for this organism is not available.     STAPHYLOCOCCUS SPECIES (COAGULASE NEGATIVE)     Note: This organism DOES NOT demonstrate inducible Clindamycin resistance in vitro.     Note: Gram Stain Report Called to,Read Back By and Verified With: JENNY BURNS 07/29/12 @ 9:32PM BY RUSCA. Gram Stain Report Called to,Read Back By and Verified With: Mignon Pine RN on 07/30/12 at 06:04 by Christie Nottingham   Report Status 08/01/2012 FINAL   Final   Organism ID, Bacteria STAPHYLOCOCCUS SPECIES (COAGULASE NEGATIVE)   Final  CULTURE, BLOOD (ROUTINE X 2)     Status: None   Collection Time    07/29/12  5:00 AM      Result Value Range Status   Specimen Description BLOOD RIGHT HAND   Final   Special Requests BOTTLES DRAWN AEROBIC AND ANAEROBIC 5 CC EA   Final   Culture  Setup Time 07/29/2012 11:18    Final   Culture NO GROWTH 5 DAYS   Final   Report Status 08/04/2012 FINAL   Final  URINE CULTURE     Status: None   Collection Time    07/29/12 10:46 AM      Result Value Range Status   Specimen Description URINE, CLEAN CATCH   Final   Special Requests NONE   Final   Culture  Setup Time 07/29/2012 14:07   Final   Colony Count 35,000 COLONIES/ML   Final   Culture     Final   Value: Multiple bacterial morphotypes present, none predominant. Suggest appropriate recollection if clinically indicated.   Report Status 07/30/2012 FINAL   Final     Labs: Basic Metabolic Panel:  Recent Labs Lab 07/29/12 0500 07/29/12 1207  07/31/12 0420 08/01/12 0350 08/02/12 0440 08/03/12 0430 08/04/12 0452  NA 125* 129*  < > 133* 136 134* 135 137  K 3.6 3.2*  < > 2.9* 3.4* 3.9 3.5 3.7  CL 94* 98  < > 101 103 103 103 105  CO2 20 21  < > 23 22  23 24 25   GLUCOSE 116* 164*  < > 146* 138* 114* 122* 101*  BUN 12 9  < > 6 5* 5* 4* 4*  CREATININE 0.81 0.73  < > 0.78 0.64 0.62 0.59 0.60  CALCIUM 8.1* 7.6*  < > 7.0* 6.9* 6.9* 7.1* 7.5*  MG  --  2.1  --  1.8  --   --   --   --   PHOS  --  0.7*  --   --   --   --   --   --   < > = values in this interval not displayed. Liver Function Tests:  Recent Labs Lab 07/29/12 0500 07/29/12 1207 07/30/12 0500 08/01/12 0350  AST 56* 48* 99* 20  ALT 62* 58* 116* 50*  ALKPHOS 456* 415* 502* 383*  BILITOT 1.1 0.9 1.1 0.4  PROT 6.7 6.1 5.9* 5.5*  ALBUMIN 2.6* 2.4* 2.2* 2.1*    Recent Labs Lab 07/29/12 0500  LIPASE 7*   No results found for this basename: AMMONIA,  in the last 168 hours CBC:  Recent Labs Lab 07/29/12 1207  07/31/12 0420 08/01/12 0350 08/02/12 0440 08/03/12 0430 08/04/12 0452  WBC 1.9*  < > 2.1* 7.6 12.7* 7.2 3.8*  NEUTROABS 1.5*  --  1.3* 6.5 11.1* 6.1  --   HGB 9.3*  < > 7.8* 7.8* 7.7* 8.4* 8.9*  HCT 27.3*  < > 23.6* 23.1* 23.6* 26.0* 27.6*  MCV 79.1  < > 79.5 80.8 81.7 83.1 84.1  PLT 105*  < > 110* 127* 140* 174 211  < > =  values in this interval not displayed. Cardiac Enzymes: No results found for this basename: CKTOTAL, CKMB, CKMBINDEX, TROPONINI,  in the last 168 hours BNP: BNP (last 3 results) No results found for this basename: PROBNP,  in the last 8760 hours CBG:  Recent Labs Lab 07/31/12 0737 07/31/12 1631 08/01/12 0725 08/02/12 0814 08/03/12 0752  GLUCAP 134* 120* 90 113* 96       Signed:  Aydan Levitz,CHRISTOPHER  [facility] 08/04/2012, 5:02 PM

## 2012-08-03 NOTE — Progress Notes (Signed)
When went to visit, staff and family were in room. Introduced self and offered support. Brought patient a prayer shawl. Explained services. Will follow up tomorrow.

## 2012-08-03 NOTE — Progress Notes (Signed)
  08/03/2012, 8:23 AM  Hospital day: 6 Antibiotics: maxipime Chemotherapy: #1 Alimta 07-21-12  Spoke with night shift RN re variable HR with her A fib. She is followed by Dr Johney Frame of cardiology, last seen 05-25-2012.  Subjective: Slept poorly last pm, up to void and some HA which has resolved. No BM yet and did not take miralax yesterday but will do that today. Swelling in LLE uncomfortable; I have changed IVF to [facility] now and again have requested TED hose. Sweating is less. Tolerated one unit PRBCs without difficulty. Is drinking fluids.   Objective: Vital signs in last 24 hours: Blood pressure 98/64, pulse 103, temperature 98.6 F (37 C), temperature source Oral, resp. rate 16, height 5' 1.5" (1.562 m), weight 191 lb 9.3 oz (86.9 kg), SpO2 100.00%.   Intake/Output from previous day: 06/04 0701 - 06/05 0700 In: 2447.5 [P.O.:780; I.V.:1066.7; Blood:350.8; IV Piggyback:250] Out: 2875 [Urine:2875] Intake/Output this shift:    Physical exam: awake, alert, not diaphoretic. Oral mucosa moist and clear. PAC site fine. Heart irreg with known A fib. Lungs without wheezes or rales anteriorly. No JVD. Abdomen soft, quiet, not more distended, not tender. LLE 2+ swelling thigh> lower leg but not tight, no erythema, clear cords or tenderness. TEDS not tried yesterday (my order may not have been correct for this). Moves all extremities. Feet warm.  Lab Results:  Recent Labs  08/02/12 0440 08/03/12 0430  WBC 12.7* 7.2  HGB 7.7* 8.4*  HCT 23.6* 26.0*  PLT 140* 174   Hgb up with one unit PRBCs yesterday BMET  Recent Labs  08/02/12 0440 08/03/12 0430  NA 134* 135  K 3.9 3.5  CL 103 103  CO2 23 24  GLUCOSE 114* 122*  BUN 5* 4*  CREATININE 0.62 0.59  CALCIUM 6.9* 7.1*   Urine culture now finalized negative. Second blood culture still pending, probably to be finalized today; first blood culture ?contaminant.  Serum iron low, ferritin high likely as acute phase  reactant.  Studies/Results: No results found.   Assessment/Plan: 1.metastatic cervical cancer to liver, inguinal and periaortic nodes: day 14 cycle 1 alimta. Continue folate due to the alimta, counts recovering from that treatment. I will see her back ~ 08-15-12 at office, or sooner after DC if needed.  2.atrial fibrillation: followed by Dr Tommie Sams for cardiology  3.fever on admission: continuing antibiotics, following cultures. The sweats are improving. Expect adjustment in antibiotics and possible DC depending on final blood culture.  4.IVC filter in. On xarelto for A fib and recurrent LLE DVT  5.multiple osteoporotic compression fractures in LS since Feb 2014. Post kyphoplasty x 2, somewhat helpful. Known to Dr Ethelene Hal. On duragesic patch particularly for associated low back pain  6.HypoK improved  7.PAC in  8.atrophic left kidney  9. Chronic swelling LLE worse with rehydration: IVF decreased to [facility]. Try thigh hi teds    LIVESAY,LENNIS P [phone]

## 2012-08-03 NOTE — Progress Notes (Signed)
[facility] PROGRESS NOTE  MAIZE BRITTINGHAM ZOX:[id number] DOB: [birth year] DOA: 07/29/2012 PCP: [name], MD  Assessment/Plan: Principal Problem:   Neutropenic fever Active Problems:   HYPERTENSION   Atrial fibrillation   Cervical cancer   DVT (deep venous thrombosis)   Anemia of chronic disease   Thrombocytopenia   Hyponatremia   Diarrhea   Protein-calorie malnutrition, severe    1. Neutropenic fever: Patient presented with a temperature of 102, wcc 1.9, ANC 1.4, in the setting of chemotherapy for malignant disease, consistent with neutropenic fever. Septic work up was carried out, and she was commenced on iv Vancomycin and Cefepime. Urine culture revealed insignificant growth, while blood culture grew Methicillin-sensitive coag negative staph in 1:3 bottles. CXR was devoid of acute findings and abdominal/pelvic CT scan demonstrated new moderate right hydronephrosis. Clinically, she has shown steady improvement, with improvement of wcc/neutrophil count, which have normalized as of 08/01/12. Now on day# 5 antibiotics. Have switched to Levaquin monotherapy from 08/03/12, based on sensitivities and for potential pseudomonas cover. Antibiotics will be concluded on 08/07/12.  2. Malignant Disease: Patient has known metastatic cervical cancer to liver, inguinal and periaortic nodes, and is s/p day 13 cycle 1 alimta. Continue folate. Dr Jama Flavors provided oncology consultation. Managing as recommended. Dr Darrold Span will see patient in office on 08/15/12.  3. Anemia/Thrombocytopenia: Patient has anemia of chronic disease/malignancy, and thrombocytopenia, which is likely sequela of chemotherapy. Transfused 1 unit of PRBCs on 08/02/12, due to symptomatic anemia, per Dr Darrold Span. Iron studies revealed iron deficiency. Per hematologist, Duncan Dull may be a consideration. 4. HTN/Hypotension: Patient has known history of HTN, but at presentation, BP was low at 94/66. This responded promptly to iv  fluids. As BP started creeping up, pre-admission antihypertensives were reinstated, but since 08/02/12, patient has had low/borderline BP. All antihypertensives were dicontinued on that date. As patient has had episodes of tachycardia overnight, will restart Lopressor at 25 mg BID (ie, half pre-admission dose).  5. Atrial fibrillation: Rate-controlled on beta-blocker.On Xarelto. As discussed in # 4, discontinued Metoprolol, on 08/02/12 due to low BP, but have reinstated today in lower dose, due to tachycardia overnight.   6. Hypokalemia: Repleting as indicated.  7. 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 Stable.  8. History of DVT (deep venous thrombosis):  Recurrent  LLE DVT, s/p IVC filter. On Xarelto. Not problematic.     Code Status: Full Code.  Family Communication:  Disposition Plan: T be determined.    Brief narrative: 62 year old female with history of migraines, atrial fibrillation, HTN, Glaucoma, previous LLE DVT, bilateral LE lymphedema, OA, GERD, CHF, cervical cancer on chemotherapy (Alimta) last dose received 07/21/2012, who presented to [facility] ED 07/29/2012 for evaluation of fever. Patient had spiked fever at home, of 102 F. She also reported an episode of diarrhea but no significant nausea or vomiting. No chest pain, no shortness of breath, no palpitations. No blood in stool or urine. No lightheadedness or loss of consciousness. In ED, BP was 94/66 but it responded IV fluids and now BP 113/57. HR was 63 - 136 range. O2 saturation was 96% while breathing ambient air. CBC revealed pancytopenia with WBC of 1.9, Hgb of 9.3 and platelet count of 105. BMP showed hypokalemia of 3.2 and hyponatremia of 129. Her INR was 2.39 (although she is not taking coumadin but Xarelto). CT abdomen revealed moderate right side hydronephrosis but no obstruction and patient does have urine output. Noted also were multiple liver metastases and metastatic cervical carcinoma. Admitted for  further management.     Consultants:  Dr Jama Flavors, oncologist.   Procedures:  See notes.   Antibiotics:  Cefepime 07/29/12>>>  Vancomycin 07/29/12-07/31/12.  HPI/Subjective: No new issues.  Objective: Vital signs in last 24 hours: Temp:  [97.5 F (36.4 C)-98.6 F (37 C)] 98.6 F (37 C) (06/05 0356) Pulse Rate:  [83-124] 103 (06/05 0356) Resp:  [16-18] 16 (06/05 0356) BP: (88-120)/(64-89) 98/64 mmHg (06/05 0356) SpO2:  [100 %] 100 % (06/05 0356) Weight:  [86.9 kg (191 lb 9.3 oz)] 86.9 kg (191 lb 9.3 oz) (06/05 0356) Weight change: 1 kg (2 lb 3.3 oz) Last BM Date: 08/02/12  Intake/Output from previous day: 06/04 0701 - 06/05 0700 In: 2447.5 [P.O.:780; I.V.:1066.7; Blood:350.8; IV Piggyback:250] Out: 2875 [Urine:2875] Total I/O In: 180 [P.O.:180] Out: 400 [Urine:400]   Physical Exam: General: Comfortable, alert, communicative, fully oriented, not short of breath at rest.  HEENT:  Moderate clinical pallor, no jaundice, no conjunctival injection or discharge. NECK:  Supple, JVP not seen, no carotid bruits, no palpable lymphadenopathy, no palpable goiter. CHEST:  Clinically clear to auscultation, no wheezes, no crackles. HEART:  Sounds 1 and 2 heard, normal, irregular, no murmurs. ABDOMEN:  Full, soft, non-tender, no palpable organomegaly, no palpable masses, normal bowel sounds. GENITALIA:  Not examined. LOWER EXTREMITIES:  Mild pitting edema, palpable peripheral pulses. MUSCULOSKELETAL SYSTEM:  Generalized osteoarthritic changes, otherwise, normal. CENTRAL NERVOUS SYSTEM:  No focal neurologic deficit on gross examination.  Lab Results:  Recent Labs  08/02/12 0440 08/03/12 0430  WBC 12.7* 7.2  HGB 7.7* 8.4*  HCT 23.6* 26.0*  PLT 140* 174    Recent Labs  08/02/12 0440 08/03/12 0430  NA 134* 135  K 3.9 3.5  CL 103 103  CO2 23 24  GLUCOSE 114* 122*  BUN 5* 4*  CREATININE 0.62 0.59  CALCIUM 6.9* 7.1*   Recent Results (from the past 240 hour(s))  CULTURE, BLOOD  (ROUTINE X 2)     Status: None   Collection Time    07/29/12  4:48 AM      Result Value Range Status   Specimen Description BLOOD LEFT HAND   Final   Special Requests BOTTLES DRAWN AEROBIC AND ANAEROBIC 5 CC EA   Final   Culture  Setup Time 07/29/2012 11:18   Final   Culture     Final   Value: BACILLUS SPECIES     Note: Standardized susceptibility testing for this organism is not available.     STAPHYLOCOCCUS SPECIES (COAGULASE NEGATIVE)     Note: This organism DOES NOT demonstrate inducible Clindamycin resistance in vitro.     Note: Gram Stain Report Called to,Read Back By and Verified With: JENNY BURNS 07/29/12 @ 9:32PM BY RUSCA. Gram Stain Report Called to,Read Back By and Verified With: Mignon Pine RN on 07/30/12 at 06:04 by Christie Nottingham   Report Status 08/01/2012 FINAL   Final   Organism ID, Bacteria STAPHYLOCOCCUS SPECIES (COAGULASE NEGATIVE)   Final  CULTURE, BLOOD (ROUTINE X 2)     Status: None   Collection Time    07/29/12  5:00 AM      Result Value Range Status   Specimen Description BLOOD RIGHT HAND   Final   Special Requests BOTTLES DRAWN AEROBIC AND ANAEROBIC 5 CC EA   Final   Culture  Setup Time 07/29/2012 11:18   Final   Culture     Final   Value:        BLOOD CULTURE RECEIVED NO GROWTH  TO DATE CULTURE WILL BE HELD FOR 5 DAYS BEFORE ISSUING A FINAL NEGATIVE REPORT   Report Status PENDING   Incomplete  URINE CULTURE     Status: None   Collection Time    07/29/12 10:46 AM      Result Value Range Status   Specimen Description URINE, CLEAN CATCH   Final   Special Requests NONE   Final   Culture  Setup Time 07/29/2012 14:07   Final   Colony Count 35,000 COLONIES/ML   Final   Culture     Final   Value: Multiple bacterial morphotypes present, none predominant. Suggest appropriate recollection if clinically indicated.   Report Status 07/30/2012 FINAL   Final     Studies/Results: No results found.  Medications: Scheduled Meds: . calcium-vitamin D  1 tablet Oral q morning -  10a  . ceFEPime (MAXIPIME) IV  2 g Intravenous Q8H  . feeding supplement  1 Container Oral TID BM  . fentaNYL  100 mcg Transdermal Q72H  . fentaNYL  25 mcg Transdermal Q72H  . ferrous fumarate  1 tablet Oral Daily  . fluticasone  2 spray Each Nare Daily  . folic acid  1 mg Oral q morning - 10a  . loratadine  10 mg Oral Daily  . multivitamin with minerals  1 tablet Oral q morning - 10a  . pantoprazole  40 mg Oral Daily  . phosphorus  500 mg Oral BID  . polyethylene glycol  17 g Oral Once  . Rivaroxaban  20 mg Oral QPM  . sodium chloride  3 mL Intravenous Q12H  . tiZANidine  4 mg Oral TID   Continuous Infusions: . 0.9 % NaCl with KCl 20 mEq / L 20 mL/hr (08/03/12 0830)   PRN Meds:.acetaminophen, acetaminophen, bisacodyl, Glycerin (Adult), HYDROcodone-acetaminophen, HYDROmorphone (DILAUDID) injection, LORazepam, ondansetron (ZOFRAN) IV, ondansetron, polyethylene glycol, sodium chloride, sodium chloride, zolpidem    LOS: 5 days   Evangaline Jou,CHRISTOPHER  [facility] Pager [phone]. If 8PM-8AM, please contact night-coverage at [email or web address], password [facility] 08/03/2012, 10:18 AM  LOS: 5 days

## 2012-08-04 DIAGNOSIS — D500 Iron deficiency anemia secondary to blood loss (chronic): Secondary | ICD-10-CM

## 2012-08-04 LAB — CBC
HCT: 27.6 % — ABNORMAL LOW (ref 36.0–46.0)
MCH: 27.1 pg (ref 26.0–34.0)
MCHC: 32.2 g/dL (ref 30.0–36.0)
MCV: 84.1 fL (ref 78.0–100.0)
RDW: 16.1 % — ABNORMAL HIGH (ref 11.5–15.5)

## 2012-08-04 LAB — BASIC METABOLIC PANEL
BUN: 4 mg/dL — ABNORMAL LOW (ref 6–23)
Chloride: 105 mEq/L (ref 96–112)
Creatinine, Ser: 0.6 mg/dL (ref 0.50–1.10)
GFR calc Af Amer: >90 mL/min (ref >90)
GFR calc non Af Amer: >90 mL/min (ref >90)

## 2012-08-04 LAB — CULTURE, BLOOD (ROUTINE X 2)

## 2012-08-04 MED ORDER — HEPARIN SOD (PORK) LOCK FLUSH 100 UNIT/ML IV SOLN: 500.0000 [IU] | INTRAVENOUS | Status: DC | PRN

## 2012-08-04 MED ORDER — UNJURY CHICKEN SOUP POWDER: 2.0000 [oz_av] | Freq: Four times a day (QID) | ORAL | Status: DC

## 2012-08-04 MED ORDER — BOOST / RESOURCE BREEZE PO LIQD: 1.0000 | Freq: Two times a day (BID) | ORAL | Status: DC

## 2012-08-04 MED ORDER — FERROUS FUMARATE 325 (106 FE) MG PO TABS: 1.0000 | ORAL_TABLET | Freq: Every day | ORAL | Status: DC

## 2012-08-04 MED ORDER — LEVOFLOXACIN 750 MG PO TABS: 750.0000 mg | ORAL_TABLET | Freq: Every day | ORAL | Status: DC

## 2012-08-04 MED ORDER — ENSURE PUDDING PO PUDG: 1.0000 | ORAL | Status: DC

## 2012-08-04 MED ORDER — K PHOS MONO-SOD PHOS DI & MONO 155-852-130 MG PO TABS: 2.0000 | ORAL_TABLET | Freq: Two times a day (BID) | ORAL | Status: DC

## 2012-08-04 NOTE — Progress Notes (Signed)
Pt. Began vomiting after pills were given. Pt vomited on two different occasions. Zofran was given. Will continue to monitor. Pills were noted in her vomit.

## 2012-08-06 LAB — TYPE AND SCREEN
ABO/RH(D): A POS
Donor AG Type: NEGATIVE
Donor AG Type: NEGATIVE

## 2012-08-11 ENCOUNTER — Encounter: Payer: Self-pay | Admitting: Internal Medicine

## 2012-08-11 ENCOUNTER — Ambulatory Visit (INDEPENDENT_AMBULATORY_CARE_PROVIDER_SITE_OTHER): Payer: 59 | Admitting: Internal Medicine

## 2012-08-11 VITALS — BP 126/82 | HR 87 | Temp 97.7°F | Ht 61.5 in | Wt 187.0 lb

## 2012-08-11 DIAGNOSIS — R5081 Fever presenting with conditions classified elsewhere: Secondary | ICD-10-CM

## 2012-08-11 DIAGNOSIS — I4891 Unspecified atrial fibrillation: Secondary | ICD-10-CM

## 2012-08-11 DIAGNOSIS — C539 Malignant neoplasm of cervix uteri, unspecified: Secondary | ICD-10-CM

## 2012-08-11 DIAGNOSIS — D709 Neutropenia, unspecified: Secondary | ICD-10-CM

## 2012-08-11 DIAGNOSIS — M7989 Other specified soft tissue disorders: Secondary | ICD-10-CM

## 2012-08-11 DIAGNOSIS — C801 Malignant (primary) neoplasm, unspecified: Secondary | ICD-10-CM

## 2012-08-11 DIAGNOSIS — I890 Lymphedema, not elsewhere classified: Secondary | ICD-10-CM

## 2012-08-11 DIAGNOSIS — C787 Secondary malignant neoplasm of liver and intrahepatic bile duct: Secondary | ICD-10-CM

## 2012-08-11 MED ORDER — SPIRONOLACTONE 25 MG PO TABS: 25.0000 mg | ORAL_TABLET | Freq: Every day | ORAL | Status: DC

## 2012-08-11 NOTE — Patient Instructions (Signed)

## 2012-08-11 NOTE — Progress Notes (Signed)
Subjective:    Patient ID: [name], female    DOB: [birth year], 62 y.o.   MRN: [id number]  HPI  Pt presents to the clinic today with c/o hospital f/u. She went to the ER on 5/31 with c/o a fever. She is currently being treated with chemotherapy for cervical cancer. She was found to be dehydrated and resuscitated with fluids. CT of the abdomen revealed liver mets. Since her discharge on 08/05/2012, she has been feeling. She does c/o of some left leg swelling. It was worse immediately after the hospital stay but seems to have gotten better since she made the appointment. She has had recurrent DVT's in the past despite being on Xarelto. She denies any redness, or tenderness, just the extra swelling. She does have lymphedema of the LLE. She is not currently undergoing treatment with PT or leg wraps.  Review of Systems      Past Medical History  Diagnosis Date  . Diastolic dysfunction   . MIGRAINE HEADACHE   . Atrial fibrillation     failed DCCV, CHADs2-prev pradaxa stopped due to hematuria with ureter issues/JJ   . Anemia   . HTN (hypertension)   . GLAUCOMA   . CARPAL TUNNEL SYNDROME, RIGHT   . Lymphedema     L>R leg from pelvic XRT/scarring  . Hepatic steatosis     CT scan 06/2009, 12/2009  . DVT (deep venous thrombosis) 10/2010    LLE, anticaog resumed  . CHF (congestive heart failure)     1 yr ago per pt  . Dyspnea on exertion   . Arthritis   . GERD (gastroesophageal reflux disease)   . Lesion of liver   . Small kidney     left  . Difficulty sleeping   . Radiation 10/11/11-11/19/11    5040 cGy in 28 fx's/periaortic  . Radiation 03/16/10-03/25/10    right inguinal node/left external iliac node  . Radiation 06/11/08-07/23/08    6000 cGy left pelvis  . Clotting disorder   . Hyperlipidemia   . Leg swelling   . Nausea   . Bruises easily   . Weakness   . Cervical cancer dx 04/2003, recur 04/2008    s/p debulk (TAHBSO), chemo/XRT; recurrent dz L pelvis dx 2010 and liver met 09/2010  s/p RFA  . Recurrent cervical cancer     Current Outpatient Prescriptions  Medication Sig Dispense Refill  . bisacodyl (DULCOLAX) 5 MG EC tablet Take 5 mg by mouth daily as needed for constipation.      . calcium-vitamin D (OSCAL WITH D) 500-200 MG-UNIT per tablet Take 1 tablet by mouth every morning.      . cetirizine (ZYRTEC) 10 MG tablet Take 10 mg by mouth every evening.       [name] dexamethasone (DECADRON) 4 MG tablet Take 4 mg by mouth 2 (two) times daily with a meal. Take the day before, day of, and day after chemo      . feeding supplement (ENSURE) PUDG Take 1 Container by mouth daily.  30 Container  0  . feeding supplement (RESOURCE BREEZE) LIQD Take 1 Container by mouth 2 (two) times daily between meals.  60 Container  0  . fentaNYL (DURAGESIC - DOSED MCG/HR) 100 MCG/HR Place 1 patch onto the skin every 3 (three) days. Total dose 125mg       . fentaNYL (DURAGESIC - DOSED MCG/HR) 25 MCG/HR Place 1 patch onto the skin every 3 (three) days. Total dose of 125 mcg      .  ferrous fumarate (HEMOCYTE - 106 MG FE) 325 (106 FE) MG TABS Take 1 tablet (106 mg of iron total) by mouth daily.  30 each  0  . folic acid (FOLVITE) 1 MG tablet Take 1 mg by mouth every morning.      [name] HYDROmorphone (DILAUDID) 4 MG tablet Take 1 tablet every 4 hours as needed for pain  85 tablet  0  . ibuprofen (ADVIL,MOTRIN) 200 MG tablet Take 600 mg by mouth 3 (three) times daily as needed for pain. Pain      . levofloxacin (LEVAQUIN) 750 MG tablet Take 1 tablet (750 mg total) by mouth daily.  4 tablet  0  . lidocaine-prilocaine (EMLA) cream Apply 1 application topically daily as needed (for port access).      . LORazepam (ATIVAN) 1 MG tablet Take 1 mg by mouth every 6 (six) hours as needed (for nausea (may put under tongue)).       . metoprolol (LOPRESSOR) 50 MG tablet Take 1 tablet (50 mg total) by mouth 2 (two) times daily.  60 tablet  1  . Multiple Vitamin (MULITIVITAMIN WITH MINERALS) TABS Take 1 tablet by mouth every  morning.       [name] omeprazole (PRILOSEC) 20 MG capsule Take 20 mg by mouth every morning.       . ondansetron (ZOFRAN) 8 MG tablet Take by mouth every 8 (eight) hours as needed for nausea.      . phosphorus (K PHOS NEUTRAL) 155-852-130 MG tablet Take 2 tablets by mouth 2 (two) times daily.  60 tablet  0  . polyethylene glycol (MIRALAX / GLYCOLAX) packet Take 17 g by mouth 2 (two) times daily as needed (constipation).       . protein supplement (UNJURY CHICKEN SOUP) POWD Take 7 g (2 oz total) by mouth 4 (four) times daily.  210 g  0  . Rivaroxaban (XARELTO) 20 MG TABS Take 20 mg by mouth every evening.      . sodium chloride (OCEAN) 0.65 % nasal spray Place 1 spray into the nose daily as needed for congestion. Allergies      . tiZANidine (ZANAFLEX) 4 MG capsule Take 1 capsule (4 mg total) by mouth 3 (three) times daily.  40 capsule  1  . triamcinolone (NASACORT) 55 MCG/ACT nasal inhaler Place 2 sprays into the nose daily as needed (allergies or congestion).       [name] zolpidem (AMBIEN) 5 MG tablet Take 1 tablet (5 mg total) by mouth at bedtime as needed for sleep.  20 tablet  0   No current facility-administered medications for this visit.    Allergies  Allergen Reactions  . Codeine Rash and Other (See Comments)    Breathing issues  . Flecainide Acetate Other (See Comments)    Doesn't remember reaction  . Morphine Other (See Comments)     Hallucinations  . Propafenone Hcl Other (See Comments)    Leg cramps    Family History  Problem Relation Age of Onset  . Lung cancer    . Hypertension    . Heart disease    . Stroke    . Asthma    . Asthma Son   . Cancer Mother     lung  . Stroke Father     History   Social History  . Marital Status: Single    Spouse Name: N/A    Number of Children: N/A  . Years of Education: N/A   Occupational History  .  Not on file.   Social History Main Topics  . Smoking status: Former Smoker -- 0.50 packs/day for 3 years    Quit date: 03/01/1984   . Smokeless tobacco: Never Used  . Alcohol Use: No  . Drug Use: No  . Sexually Active: No   Other Topics Concern  . Not on file   Social History Narrative   Lives with significant other in [place], [place].   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   has grown son and 2 g-sons     Constitutional: Pt reports fatigue. Denies fever, malaise, headache or abrupt weight changes.  Respiratory: Denies difficulty breathing, shortness of breath, cough or sputum production.   Cardiovascular: Pt reports increased swelling of the left leg. Denies chest pain, chest tightness, palpitations or swelling in the hands.  Skin: Denies redness, rashes, lesions or ulcercations.  Neurological: Denies dizziness, difficulty with memory, difficulty with speech or problems with balance and coordination.   No other specific complaints in a complete review of systems (except as listed in HPI above).  Objective:   Physical Exam   BP 126/82  Pulse 87  Temp(Src) 97.7 F (36.5 C) (Oral)  Ht 5' 1.5" (1.562 m)  Wt 187 lb (84.823 kg)  BMI 34.77 kg/m2  SpO2 97% Wt Readings from Last 3 Encounters:  08/11/12 187 lb (84.823 kg)  08/03/12 191 lb 9.3 oz (86.9 kg)  07/11/12 186 lb 4.8 oz (84.505 kg)    General: Appears her stated age, obese but well developed, well nourished in NAD. Skin: Warm, dry and intact. No rashes, lesions or ulcerations noted. Cardiovascular: Normal rate and rhythm. S1,S2 noted.  No murmur, rubs or gallops noted. No JVD, 4+ swelling of the LLE, lymphedema noted. No carotid bruits noted. Pulmonary/Chest: Normal effort and positive vesicular breath sounds. No respiratory distress. No wheezes, rales or ronchi noted.  Neurological: Alert and oriented. Cranial nerves II-XII intact. Coordination normal. +DTRs bilaterally.   BMET    Component Value Date/Time   NA 137 08/04/2012 0452   NA 135* 07/21/2012 1107   K 3.7 08/04/2012 0452   K 4.7 07/21/2012 1107   CL 105 08/04/2012 0452   CL 105 07/21/2012 1107    CO2 25 08/04/2012 0452   CO2 21* 07/21/2012 1107   GLUCOSE 101* 08/04/2012 0452   GLUCOSE 151* 07/21/2012 1107   BUN 4* 08/04/2012 0452   BUN 12.8 07/21/2012 1107   CREATININE 0.60 08/04/2012 0452   CREATININE 1.0 07/21/2012 1107   CREATININE 1.45* 09/11/2010 1603   CALCIUM 7.5* 08/04/2012 0452   CALCIUM 8.5 07/21/2012 1107   GFRNONAA >90 08/04/2012 0452   GFRAA >90 08/04/2012 0452    Lipid Panel  No results found for this basename: chol, trig, hdl, cholhdl, vldl, ldlcalc    CBC    Component Value Date/Time   WBC 3.8* 08/04/2012 0452   WBC 2.0* 07/28/2012 1418   RBC 3.28* 08/04/2012 0452   RBC 4.03 07/28/2012 1418   HGB 8.9* 08/04/2012 0452   HGB 10.8* 07/28/2012 1418   HCT 27.6* 08/04/2012 0452   HCT 32.4* 07/28/2012 1418   PLT 211 08/04/2012 0452   PLT 169 07/28/2012 1418   MCV 84.1 08/04/2012 0452   MCV 80.3 07/28/2012 1418   MCH 27.1 08/04/2012 0452   MCH 26.9 07/28/2012 1418   MCHC 32.2 08/04/2012 0452   MCHC 33.5 07/28/2012 1418   RDW 16.1* 08/04/2012 0452   RDW 15.2* 07/28/2012 1418   LYMPHSABS 0.5* 08/03/2012 0430   LYMPHSABS  0.3* 07/28/2012 1418   MONOABS 0.6 08/03/2012 0430   MONOABS 0.1 07/28/2012 1418   EOSABS 0.1 08/03/2012 0430   EOSABS 0.0 07/28/2012 1418   BASOSABS 0.0 08/03/2012 0430   BASOSABS 0.0 07/28/2012 1418    Hgb A1C Lab Results  Component Value Date   HGBA1C 5.2 09/19/2008        Assessment & Plan:   LLE swelling, worse than her normal lymphedema, new onset:  Will try Aldactone 25 mg x 3-5 days Let me know if it is not any better in that time Keep leg elevated Encouraged pt to restart leg wraps for lymphedema My suspicion for recurrent DVT is low at this time  Hospital f/u for neutropenic fever, dehydration, afib, cervical cancer with liver metastasis:  Pt seems to be doing well Continue all current medications at this time  Continue to monitor for fever, signs of infection, and rapid heart rate Follow up with your oncologist when scheduled

## 2012-08-14 ENCOUNTER — Telehealth: Payer: Self-pay | Admitting: Dietician

## 2012-08-15 ENCOUNTER — Telehealth: Payer: Self-pay | Admitting: Oncology

## 2012-08-15 ENCOUNTER — Encounter: Payer: Self-pay | Admitting: Oncology

## 2012-08-15 ENCOUNTER — Other Ambulatory Visit: Payer: Self-pay | Admitting: *Deleted

## 2012-08-15 ENCOUNTER — Ambulatory Visit (HOSPITAL_COMMUNITY)
Admission: RE | Admit: 2012-08-15 | Discharge: 2012-08-15 | Disposition: A | Discharge status: Home / Self Care | Payer: 59 | Source: Ambulatory Visit | Attending: Oncology | Admitting: Oncology

## 2012-08-15 ENCOUNTER — Other Ambulatory Visit (HOSPITAL_BASED_OUTPATIENT_CLINIC_OR_DEPARTMENT_OTHER): Payer: 59 | Admitting: Lab

## 2012-08-15 ENCOUNTER — Ambulatory Visit (HOSPITAL_BASED_OUTPATIENT_CLINIC_OR_DEPARTMENT_OTHER): Payer: 59

## 2012-08-15 ENCOUNTER — Ambulatory Visit (HOSPITAL_BASED_OUTPATIENT_CLINIC_OR_DEPARTMENT_OTHER): Payer: 59 | Admitting: Oncology

## 2012-08-15 VITALS — BP 110/87 | HR 100 | Temp 97.7°F | Resp 18 | Ht 61.0 in | Wt 192.1 lb

## 2012-08-15 DIAGNOSIS — Z452 Encounter for adjustment and management of vascular access device: Secondary | ICD-10-CM

## 2012-08-15 DIAGNOSIS — C539 Malignant neoplasm of cervix uteri, unspecified: Secondary | ICD-10-CM

## 2012-08-15 DIAGNOSIS — Z79899 Other long term (current) drug therapy: Secondary | ICD-10-CM | POA: Insufficient documentation

## 2012-08-15 DIAGNOSIS — C772 Secondary and unspecified malignant neoplasm of intra-abdominal lymph nodes: Secondary | ICD-10-CM

## 2012-08-15 DIAGNOSIS — K769 Liver disease, unspecified: Secondary | ICD-10-CM | POA: Insufficient documentation

## 2012-08-15 LAB — BASIC METABOLIC PANEL (CC13)
CO2: 21 mEq/L — ABNORMAL LOW (ref 22–29)
Calcium: 8.7 mg/dL (ref 8.4–10.4)
Creatinine: 0.7 mg/dL (ref 0.6–1.1)
Glucose: 119 mg/dl — ABNORMAL HIGH (ref 70–99)

## 2012-08-15 LAB — CBC WITH DIFFERENTIAL/PLATELET
BASO%: 2.5 % — ABNORMAL HIGH (ref 0.0–2.0)
EOS%: 1.8 % (ref 0.0–7.0)
HCT: 30.9 % — ABNORMAL LOW (ref 34.8–46.6)
MCH: 28.7 pg (ref 25.1–34.0)
MCHC: 34.2 g/dL (ref 31.5–36.0)
NEUT%: 66.1 % (ref 38.4–76.8)
lymph#: 0.7 10*3/uL — ABNORMAL LOW (ref 0.9–3.3)

## 2012-08-15 MED ORDER — HYDROMORPHONE HCL 4 MG PO TABS: ORAL_TABLET | ORAL | Status: DC

## 2012-08-15 MED ORDER — HEPARIN SOD (PORK) LOCK FLUSH 100 UNIT/ML IV SOLN
500.0000 [IU] | Freq: Once | INTRAVENOUS | Status: AC
  Administered 2012-08-15: 500 [IU] via INTRAVENOUS
  Filled 2012-08-15: qty 5

## 2012-08-15 MED ORDER — SODIUM CHLORIDE 0.9 % IJ SOLN
10.0000 mL | INTRAMUSCULAR | Status: DC | PRN
  Administered 2012-08-15: 10 mL via INTRAVENOUS
  Filled 2012-08-15: qty 10

## 2012-08-15 MED ORDER — FENTANYL 25 MCG/HR TD PT72: 1.0000 | MEDICATED_PATCH | TRANSDERMAL | Status: DC

## 2012-08-15 MED ORDER — FENTANYL 100 MCG/HR TD PT72: 1.0000 | MEDICATED_PATCH | TRANSDERMAL | Status: DC

## 2012-08-15 NOTE — Progress Notes (Signed)
OFFICE PROGRESS NOTE   08/15/2012   Physicians:J.Marland KitchenKinard, V.Leschber, D.ClarkePearson, J.Allred, D.Margarita Grizzle, H.Derrell Lolling, Kathie Rhodes.Deveshwar (IR), Dr R.Ramos   INTERVAL HISTORY:   Patient is seen, together with significant other, in follow up of metastatic cervical cancer. She was hospitalization 5-31 thru 08-04-12 with fever, while leukopenic from first alimta given 07-21-12.  Source of the fever was not clearly identified tho she did improve with antibiotics; she had coag negative staph in one of 2 peripheral blood cultures, felt contaminant. CT AP showed stable liver disease compared with scan of early May and question of new right hydronephrosis which was not comfirmed by renal US. She was transfused 1 unit PRBCs on 08-02-12. Patient has felt gradually better since DC home. Even prior to the hospitalization, she had had a very difficult time with side effects from the first Alimta. She has PAC in.   Oncologic History  Patient had radical hysterectomy, bilateral salpingo-oophorectomy and pelvic lymphadenectomy in March of 2005 by Dr Ronita Hipps for a stage IB cervical cancer. Final pathology showed no residual disease and all lymph nodes were negative. The patient was followed for 5 years with no evidence of disease until she developed a recurrence of the right pelvic sidewall in February of 2010. This caused obstruction of the right ureter. She was treated with radiation therapy and concurrent cisplatin chemotherapy. In late 2011 she had recurrence in right inguinal lymph node and left pelvic nodes and received additional radiation therapy to those areas, completed in January 2012. In August 2012 she had a 2.8 cm liver lesion and received 6 cycles of Taxol + one cycle of carboplatin. She had a partial response to chemo, then increase in the solitary liver lesion and RFA was performed on 10/07/2011. 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 After ~ 3 months she was changed to xarelto by Dr Felicity Coyer because of local bruising and discomfort from the lovenox injections; she continues xarelto now. She had progression in left periaortic nodes which was symptomatic with pain and treated by Dr Roselind Messier with 5040 cGY in 28 fractions from 10-11-11 thru 11-19-11. Pain has improved since radiation and she has been able to decrease pain medication to present oycontin 20 mg q 12 hrs with prn percocet. CT CAP repeated 12-31-11 found multiple new liver lesions, small left inguinal nodes and a 5 cm fluid collection right groin. Chemotherapy was resumed with low dose carboplatin and taxol on 02-01-12, three cycles thru 03-28-12, complicated by cytopenias despite decreased doses. Restaging CT AP 04-07-12 unfortunately showed increase in size of most of the liver mets, tho no new disease elsewhere    She has increased swelling in LLE, not improved with spironolactone for past 5 days from Dr Diamantina Monks office and she will stop that now. Back pain is better, presently on duragesic 125 mcg/72 hrs with ~3 dilaudid used in last 24 hours; I have asked her to wait to decrease the patches to 100/ 72 hrs until she needs just ~ 1 dilaudid daily. She still has some RUQ/ R flank pain intermittently. She has had no fever, no symptoms of infection. She is more mobile at home and appetite is a little better. Bowels are moving, tho not always daily.She has had no bleeding, continues xarelto. Remainder of 10 point Review of Systems negative.  Objective:  Vital signs in last 24 hours:  BP 110/87  Pulse 100  Temp(Src) 97.7 F (36.5 C) (Oral)  Resp 18  Ht 5\' 1"  (1.549 m)  Wt 192 lb 1.6 oz (87.136 kg)  BMI 36.32 kg/m2  Walked to exam room today! Respirations not labored. Smiling, looks best she has in several months.  HEENT:PERRLA, sclera clear, anicteric and oropharynx clear, no  lesions LymphaticsCervical, supraclavicular, and axillary nodes normal. Resp: clear to auscultation bilaterally and normal percussion bilaterally Cardio: irregular, tachycardic, no gallop GI: obese, full RUQ, no rub. Quiet, not distended, not tender Extremities: tight swelling LLE, no erythema, no weeping Neuro:no sensory deficits noted Skin without rash or ecchymosis Portacath-without erythema or tenderness, this flushed with labs today.  Lab Results:  Results for orders placed in visit on 08/15/12  CBC WITH DIFFERENTIAL      Result Value Range   WBC 3.6 (*) 3.9 - 10.3 10e3/uL   NEUT# 2.4  1.5 - 6.5 10e3/uL   HGB 10.6 (*) 11.6 - 15.9 g/dL   HCT 16.1 (*) 09.6 - 04.5 %   Platelets 214  145 - 400 10e3/uL   MCV 84.1  79.5 - 101.0 fL   MCH 28.7  25.1 - 34.0 pg   MCHC 34.2  31.5 - 36.0 g/dL   RBC 4.09 (*) 8.11 - 9.14 10e6/uL   RDW 19.0 (*) 11.2 - 14.5 %   lymph# 0.7 (*) 0.9 - 3.3 10e3/uL   MONO# 0.4  0.1 - 0.9 10e3/uL   Eosinophils Absolute 0.1  0.0 - 0.5 10e3/uL   Basophils Absolute 0.1  0.0 - 0.1 10e3/uL   NEUT% 66.1  38.4 - 76.8 %   LYMPH% 18.5  14.0 - 49.7 %   MONO% 11.1  0.0 - 14.0 %   EOS% 1.8  0.0 - 7.0 %   BASO% 2.5 (*) 0.0 - 2.0 %  BASIC METABOLIC PANEL (CC13)      Result Value Range   Sodium 140  136 - 145 mEq/L   Potassium 4.2  3.5 - 5.1 mEq/L   Chloride 108 (*) 98 - 107 mEq/L   CO2 21 (*) 22 - 29 mEq/L   Glucose 119 (*) 70 - 99 mg/dl   BUN 9.9  7.0 - 78.2 mg/dL   Creatinine 0.7  0.6 - 1.1 mg/dL   Calcium 8.7  8.4 - 95.6 mg/dL     Studies/Results:  US Abdomen Limited  08/15/2012   *RADIOLOGY REPORT*  Clinical Data: Follow-up liver metastases following first treatment of Alimta chemotherapy.  Metastatic cervical carcinoma.  LIMITED ABDOMINAL ULTRASOUND  Comparison:  Abdomen CT dated 07/29/2012.  Findings: Direct comparison is not possible with different modalities.  Currently, three ill-defined masses are visible in the liver.  One is in the central liver on the  right, measuring 4.3 x 3.8 x 3.7 cm.  This most likely corresponds to a 4.4 x 4.3 cm mass seen on image number 16 of the most recent CT.  In the posterior right lobe, there is a poorly defined mass measuring approximately 4.3 x 3.7 x 3.4 cm.  This most likely corresponds to a mass previously measuring 4.4 x 3.6 cm on image number 23.  In the medial segment of the left lobe, a poorly defined mass measures approximately 3.3 x 3.1 x 2.8 cm, corresponding to a mass previously measuring 4.4 x 3.1 cm on image number 24.  IMPRESSION: There are three liver masses visible sonographically.  Two of these are not well defined with little gross change in size compared to the most recent  CT, as described above.   Original Report Authenticated By: Beckie Salts, M.D.    CT ABDOMEN AND PELVIS WITH CONTRAST 07-29-12 Technique: Multidetector CT imaging of the abdomen and pelvis was  performed following the standard protocol during bolus  administration of intravenous contrast.  Contrast: 50mL OMNIPAQUE IOHEXOL 300 MG/ML SOLN, OMNIPAQUE  IOHEXOL 300 MG/ML SOLN  Comparison: 07/04/2012  Findings: There is subsegmental atelectasis at the lung bases. No  pulmonary nodules or pleural effusion. The heart is mildly  enlarged.  Multiple liver metastatic lesions are noted unchanged from the  recent prior study. The spleen is unremarkable. The gallbladder  is surgically absent. No bile duct dilation.  The pancreas is partly fatty replaced. There is some stranding in  the peripancreatic and peri celiac fat pad is mildly increased from  prior exam. Consider a mild pancreatitis in the proper clinical  setting.  No adrenal masses. Marked atrophy of the left kidney with a small  lower pole presumed cyst, stable.  There is moderate right hydronephrosis that has developed since the  prior exam. There is mild to moderate dilation of the right  ureter. The bladder distended. No bladder masses seen. There is  no renal or  ureteral stones. The right ureter is decompressed in  the pelvis. The exact cause of the hydronephrosis is unclear.  Left periaortic adenopathy is stable. There is a fluid density  left external iliac lesion that is also unchanged from the recent  prior exam.  There are changes from hysterectomy. No pelvic masses. There is  no ascites.  The bowel is unremarkable. A normal appendix is visualized.  Multiple lumbar compression fractures are noted to which had been  treated with a previous vertebroplasty. These are stable. No  osteoblastic or osteolytic lesions.  IMPRESSION:  New moderate right hydronephrosis. The etiology of this is  unclear. The right proximal mid ureter is mildly dilated but is  decompressed distally. No stone is seen. No masses is seen  obstructing the ureter or within the bladder.  Mild fat stranding adjacent to the pancreas has increased from  prior exam. This also tracks along the left lateral coronal fascia  and anterior pararenal fascia. This is nonspecific. Consider mild  pancreatitis in the proper clinical setting.  No other evidence of an acute abnormality.  Metastatic disease from cervical carcinoma involving the left  periaortic lymph nodes, a left external iliac chain presumed lymph  node and multiple liver lesions. This is stable from the recent  prior exam.     Medications: I have reviewed the patient's current medications. Stop spironolactone, potassium phosphate. Continue duragesic 125/ 72 hrs and prn dilaudid.   We have discussed trying lymphedema PT to see if this might be of any benefit for the LLE; unfortunately this is not available at [facility] and travel to [place] is not convenient regularly. She will let us know if she wants referral in [place].  We have discussed fact that the Alimta was too rigorous for her to tolerate further. As she is actually feeling gradually better now off treatment, I have encouraged her to continue  just the interventions that are helping for now. She will see Dr Yolande Jolly next in August when [facility] is due flush, and may want to continue follow up with gyn oncology rather than interim coverage with medical oncology. She understands that PAC needs to be flushed every 6-8 weeks when not otherwise used.   Assessment/Plan: 1.cervical cancer metastatic to liver, inguinal and periaortic nodes:  history as above, very poor tolerance for chemotherapy which really seems to worsen quality of life without improving situation. Managing with palliative interventions presently. 2.atrial fibrillation followed by Dr Johney Frame 3.previous LLE DVT as well as radiation and tumor obstruction to drainage there, with significant swelling ongoing. No DVT now by recent imaging. Refer to lymphedema PT if she requests. 4.PAC in 5.atrophic left kidney, no significant obstruction by renal US on right and stable good renal function 6.multiple osteoporotic compression fractures LS post kyphoplasties and on duragesic/ dilaudid. Prolia given 07-18-12  Patient and significant other in agreement with plan above. TIme spent 25 min.  LIVESAY,LENNIS P, MD   08/15/2012, 7:45 PM

## 2012-08-15 NOTE — Patient Instructions (Addendum)
Call MD for problems 

## 2012-09-13 ENCOUNTER — Other Ambulatory Visit: Payer: Self-pay | Admitting: *Deleted

## 2012-09-13 MED ORDER — RIVAROXABAN 20 MG PO TABS: 20.0000 mg | ORAL_TABLET | Freq: Every evening | ORAL | Status: DC

## 2012-09-15 ENCOUNTER — Ambulatory Visit (INDEPENDENT_AMBULATORY_CARE_PROVIDER_SITE_OTHER): Payer: 59 | Admitting: Internal Medicine

## 2012-09-15 ENCOUNTER — Encounter: Payer: Self-pay | Admitting: Internal Medicine

## 2012-09-15 VITALS — BP 102/70 | HR 80 | Temp 98.0°F | Wt 181.6 lb

## 2012-09-15 DIAGNOSIS — IMO0002 Reserved for concepts with insufficient information to code with codable children: Secondary | ICD-10-CM

## 2012-09-15 DIAGNOSIS — C539 Malignant neoplasm of cervix uteri, unspecified: Secondary | ICD-10-CM

## 2012-09-15 DIAGNOSIS — M8080XA Other osteoporosis with current pathological fracture, unspecified site, initial encounter for fracture: Secondary | ICD-10-CM | POA: Insufficient documentation

## 2012-09-15 DIAGNOSIS — I4891 Unspecified atrial fibrillation: Secondary | ICD-10-CM

## 2012-09-15 DIAGNOSIS — M8080XS Other osteoporosis with current pathological fracture, unspecified site, sequela: Secondary | ICD-10-CM

## 2012-09-15 DIAGNOSIS — S32030S Wedge compression fracture of third lumbar vertebra, sequela: Secondary | ICD-10-CM

## 2012-09-15 MED ORDER — FENTANYL 100 MCG/HR TD PT72: 1.0000 | MEDICATED_PATCH | TRANSDERMAL | Status: DC

## 2012-09-15 MED ORDER — FENTANYL 25 MCG/HR TD PT72: 1.0000 | MEDICATED_PATCH | TRANSDERMAL | Status: DC

## 2012-09-15 NOTE — Assessment & Plan Note (Signed)
Dx 04/2003: s/p chemo/debulk/XRT Recurrent dx 04/2009 L pelvic wall and liver met - resumed chemo 09/2010 - 03/05/2011 Trial Alimita 06/2012 caused neutropenic fever (hosp x 1 week for same), side effects intolerable Currently no chemo - follow up with gyn onc 09/2012 planned Multiple complications related to same and renal involvement/obstruction -  DVT LLE 10/2010 and 04/2011 likely related to hypercoagulable state from malignancy - s/p IVC filter 04/2011 Hepatic metastatic lesion RF ablation done 05/07/11 by  interventional radiology  L flank discomfort related to increase LN size on 05/2011 CT and PET 08/2011> increase L RP LN activity - started XRT for same 09/2011 -  Now RUQ pain, suspect related to liver mets Continue pain mgmt as onngoing Hematuria with XRT (hx same with ureter lesion during 2011 XRT) - complicated by anticoag

## 2012-09-15 NOTE — Patient Instructions (Signed)
It was good to see you today. We have reviewed your prior records including labs and tests today Medications reviewed and updated, no changes recommended at this time. Refill on medication(s) as discussed today. I will fill your pain medication refills at this office -call monthly or as needed Followup here in 2-3 months, sooner if problems Will plan for next Prolia injection for your bones to occur after 01/18/2013 (every 6 months)

## 2012-09-15 NOTE — Assessment & Plan Note (Signed)
L1 and L3 compression fractures 04/2012 Pain mgmt ongoing for same - will refill narcotics here - refill today prolia began 07/18/12 - will arrange q69mo injections here - next 12/2012

## 2012-09-15 NOTE — Assessment & Plan Note (Signed)
DCCV 07/2010 - briefly off Pradaxa in 09/2010 Then back on coumadin due LLE DVT 10/2010 Changed to xarelto summer 2013 - continues same now Continue rate control (failed DCCV in 2012) follow up cards as needed

## 2012-09-15 NOTE — Progress Notes (Signed)
Subjective:    Patient ID: [name], female    DOB: 05/13/1950, 62 y.o.   MRN: [id number]  HPI  Here for followup -Reviewed interval history  Past Medical History  Diagnosis Date  . Diastolic dysfunction   . MIGRAINE HEADACHE   . Atrial fibrillation     failed DCCV, CHADs2-prev pradaxa stopped due to hematuria with ureter issues/JJ   . Anemia   . HTN (hypertension)   . GLAUCOMA   . CARPAL TUNNEL SYNDROME, RIGHT   . Lymphedema     L>R leg from pelvic XRT/scarring  . Hepatic steatosis     CT scan 06/2009, 12/2009  . DVT (deep venous thrombosis) 10/2010    LLE, anticoag resumed  . CHF (congestive heart failure)     1 yr ago per pt  . Arthritis   . GERD (gastroesophageal reflux disease)   . Small kidney     left  . Radiation 10/11/11-11/19/11    5040 cGy in 28 fx's/periaortic  . Radiation 03/16/10-03/25/10    right inguinal node/left external iliac node  . Radiation 06/11/08-07/23/08    6000 cGy left pelvis  . Hyperlipidemia   . Leg swelling     left  . Cervical cancer dx 04/2003, recur 04/2008    s/p debulk (TAHBSO), chemo/XRT; recurrent dz L pelvis dx 2010 and liver met 09/2010 s/p RFA  . Osteoporosis with fracture 2014    compression fx, s/p KP ; started prolia 06/2012- narcotic pain mgmt    Review of Systems  Constitutional: Negative for fever and fatigue.  Gastrointestinal: Negative for bowel incontinence.  Genitourinary: Positive for flank pain. Negative for bladder incontinence.  Musculoskeletal: Positive for back pain and gait problem. Negative for myalgias and joint swelling.  Skin: Negative for rash and wound.  Neurological: Positive for weakness. Negative for numbness and headaches.       Objective:   Physical Exam  BP 102/70  Pulse 80  Temp(Src) 98 F (36.7 C) (Oral)  Wt 181 lb 9.6 oz (82.373 kg)  BMI 34.33 kg/m2  SpO2 97% Wt Readings from Last 3 Encounters:  09/15/12 181 lb 9.6 oz (82.373 kg)  08/15/12 192 lb 1.6 oz (87.136 kg)  08/11/12 187 lb  (84.823 kg)   Constitutional: She is obese, but appears well-developed and well-nourished. emotional but no distress. sitting in wheelchair for support.  Neck: Normal range of motion. Neck supple. No JVD present. No thyromegaly present.  Cardiovascular: Normal rate, regular rhythm and normal heart sounds.  No murmur heard. No BLE edema. Pulmonary/Chest: Effort normal and breath sounds normal. No respiratory distress. She has no wheezes.  Psychiatric: She has a dysphoric mood and affect. Her behavior is normal. Judgment and thought content normal.   Lab Results  Component Value Date   WBC 3.6* 08/15/2012   HGB 10.6* 08/15/2012   HCT 30.9* 08/15/2012   PLT 214 08/15/2012   GLUCOSE 119* 08/15/2012   ALT 50* 08/01/2012   AST 20 08/01/2012   NA 140 08/15/2012   K 4.2 08/15/2012   CL 108* 08/15/2012   CREATININE 0.7 08/15/2012   BUN 9.9 08/15/2012   CO2 21* 08/15/2012   TSH 3.289 07/29/2012   INR 2.39* 07/29/2012   HGBA1C 5.2 09/19/2008    Mr Lumbar Spine Wo Contrast  04/20/2012  *RADIOLOGY REPORT*  Clinical Data: 62 year old female with recent kyphoplasty.  History of recurrent cervical cancer.  New onset severe back pain.   IMPRESSION: 1.  New mild L1 superior endplate fracture.  Mild bony retropulsion without spinal stenosis.  The MRI appearance is very similar to the L3 fracture at its presentation, and as before no discrete bone metastasis is identified. 2.  L3 compression fracture now status post augmentation with no adverse features identified. 3.  Stable abnormal left kidney and left pararenal retroperitoneal soft tissues.   Original Report Authenticated By: Erskine Speed, M.D.    Ct Abdomen Pelvis W Contrast  04/07/2012  *RADIOLOGY REPORT*  Clinical Data: Metastatic cervical cancer  CT ABDOMEN AND PELVIS WITH CONTRAST  IMPRESSION:  1.  Multifocal low attenuation lesions within the liver parenchyma consistent with metastatic disease. Most of these lesions have increased in size from previous exam. 2.   Periaortic lymph node metastasis involving the left kidney has decreased in size from previous exam.  There has been progressive atrophy of the left kidney. 3.  Resolution of fluid attenuating mass within the right inguinal region.  There is a new small fluid attenuating structure within the distal left external iliac lymph node chain which may represent a lymphocele or seroma.  Necrotic lymph node is also a diagnostic consideration.   Original Report Authenticated By: Signa Kell, M.D.    US Abdomen Limited  08/15/2012   *RADIOLOGY REPORT*  Clinical Data: Follow-up liver metastases following first treatment of Alimta chemotherapy.  Metastatic cervical carcinoma.  LIMITED ABDOMINAL ULTRASOUND  Comparison:  Abdomen CT dated 07/29/2012.  Findings: Direct comparison is not possible with different modalities.  Currently, three ill-defined masses are visible in the liver.  One is in the central liver on the right, measuring 4.3 x 3.8 x 3.7 cm.  This most likely corresponds to a 4.4 x 4.3 cm mass seen on image number 16 of the most recent CT.  In the posterior right lobe, there is a poorly defined mass measuring approximately 4.3 x 3.7 x 3.4 cm.  This most likely corresponds to a mass previously measuring 4.4 x 3.6 cm on image number 23.  In the medial segment of the left lobe, a poorly defined mass measures approximately 3.3 x 3.1 x 2.8 cm, corresponding to a mass previously measuring 4.4 x 3.1 cm on image number 24.  IMPRESSION: There are three liver masses visible sonographically.  Two of these are not well defined with little gross change in size compared to the most recent CT, as described above.   Original Report Authenticated By: Beckie Salts, M.D.      Assessment & Plan:   See problem list. Medications and labs reviewed today.  Time spent with pt today 25 minutes, greater than 50% time spent counseling patient on Interval history, pain management and metastatic cervical cancer, and medication review.  Also review of interval medical records

## 2012-09-27 ENCOUNTER — Other Ambulatory Visit: Payer: Self-pay | Admitting: *Deleted

## 2012-09-27 MED ORDER — METOPROLOL TARTRATE 50 MG PO TABS: 50.0000 mg | ORAL_TABLET | Freq: Two times a day (BID) | ORAL | Status: DC

## 2012-09-28 ENCOUNTER — Other Ambulatory Visit: Payer: Self-pay | Admitting: *Deleted

## 2012-09-28 MED ORDER — METOPROLOL TARTRATE 50 MG PO TABS: 50.0000 mg | ORAL_TABLET | Freq: Two times a day (BID) | ORAL | Status: DC

## 2012-09-28 NOTE — Telephone Encounter (Signed)
Refill sent.

## 2012-10-03 ENCOUNTER — Ambulatory Visit: Payer: 59 | Attending: Gynecology | Admitting: Gynecology

## 2012-10-03 ENCOUNTER — Other Ambulatory Visit (HOSPITAL_BASED_OUTPATIENT_CLINIC_OR_DEPARTMENT_OTHER): Payer: 59 | Admitting: Lab

## 2012-10-03 ENCOUNTER — Encounter: Payer: Self-pay | Admitting: Gynecology

## 2012-10-03 ENCOUNTER — Ambulatory Visit (HOSPITAL_BASED_OUTPATIENT_CLINIC_OR_DEPARTMENT_OTHER): Payer: 59

## 2012-10-03 VITALS — BP 108/80 | HR 66 | Temp 97.8°F | Resp 18 | Ht 61.5 in | Wt 178.3 lb

## 2012-10-03 DIAGNOSIS — C539 Malignant neoplasm of cervix uteri, unspecified: Secondary | ICD-10-CM

## 2012-10-03 DIAGNOSIS — R630 Anorexia: Secondary | ICD-10-CM | POA: Insufficient documentation

## 2012-10-03 DIAGNOSIS — Z923 Personal history of irradiation: Secondary | ICD-10-CM | POA: Insufficient documentation

## 2012-10-03 DIAGNOSIS — F329 Major depressive disorder, single episode, unspecified: Secondary | ICD-10-CM | POA: Insufficient documentation

## 2012-10-03 DIAGNOSIS — Z9079 Acquired absence of other genital organ(s): Secondary | ICD-10-CM | POA: Insufficient documentation

## 2012-10-03 DIAGNOSIS — G8929 Other chronic pain: Secondary | ICD-10-CM | POA: Insufficient documentation

## 2012-10-03 DIAGNOSIS — Z452 Encounter for adjustment and management of vascular access device: Secondary | ICD-10-CM

## 2012-10-03 DIAGNOSIS — F3289 Other specified depressive episodes: Secondary | ICD-10-CM | POA: Insufficient documentation

## 2012-10-03 DIAGNOSIS — Z9221 Personal history of antineoplastic chemotherapy: Secondary | ICD-10-CM | POA: Insufficient documentation

## 2012-10-03 DIAGNOSIS — Z90710 Acquired absence of both cervix and uterus: Secondary | ICD-10-CM | POA: Insufficient documentation

## 2012-10-03 LAB — COMPREHENSIVE METABOLIC PANEL (CC13)
Albumin: 3.1 g/dL — ABNORMAL LOW (ref 3.5–5.0)
Alkaline Phosphatase: 202 U/L — ABNORMAL HIGH (ref 40–150)
CO2: 21 mEq/L — ABNORMAL LOW (ref 22–29)
Glucose: 106 mg/dl (ref 70–140)
Potassium: 4.1 mEq/L (ref 3.5–5.1)
Sodium: 138 mEq/L (ref 136–145)
Total Protein: 6.6 g/dL (ref 6.4–8.3)

## 2012-10-03 LAB — CBC WITH DIFFERENTIAL/PLATELET
Eosinophils Absolute: 0.1 10*3/uL (ref 0.0–0.5)
MONO#: 0.7 10*3/uL (ref 0.1–0.9)
MONO%: 14 % (ref 0.0–14.0)
NEUT#: 3.2 10*3/uL (ref 1.5–6.5)
RBC: 3.76 10*6/uL (ref 3.70–5.45)
RDW: 16.9 % — ABNORMAL HIGH (ref 11.2–14.5)
WBC: 4.8 10*3/uL (ref 3.9–10.3)

## 2012-10-03 MED ORDER — SODIUM CHLORIDE 0.9 % IJ SOLN
10.0000 mL | INTRAMUSCULAR | Status: DC | PRN
  Administered 2012-10-03: 10 mL via INTRAVENOUS
  Filled 2012-10-03: qty 10

## 2012-10-03 MED ORDER — HEPARIN SOD (PORK) LOCK FLUSH 100 UNIT/ML IV SOLN
500.0000 [IU] | Freq: Once | INTRAVENOUS | Status: AC
  Administered 2012-10-03: 500 [IU] via INTRAVENOUS
  Filled 2012-10-03: qty 5

## 2012-10-03 NOTE — Progress Notes (Signed)
Consult Note: Gyn-Onc   [name] 62 y.o. female  Chief Complaint  Patient presents with  . Cervical Cancer    Follow up   Assessment: Metastatic cervical cancer, depression, anorexia Plan: We will arrange the patient be seen by Dr. Welton Flakes to resume Alimta. The patient is strongly desirous of continuing therapy for her metastatic cervical cancer.  She's clearly depressed and therefore given a prescription for Celexa beginning at 20 mg a day for one week and then increase to 40 mg a day.  fordecreased appetite she is given a prescription for Megace elixir 10 cc daily. Interval History:  The patient returns today for continuing followup. I last saw her approximately a year ago. Subsequently she's receive some additional radiation therapy and then carboplatin and Taxol chemotherapy between December of 2013 in January 2014. Complicated by neutropenia. CT scan showed progressive disease in the liver. In May 2014 she received one dose of alimta but further treatment has been delayed because the patient was admitted with a fever while leukopenic from her first Alimta dose. Source of fevers not clearly identified and she improved with antibiotics. She also received one unit packed red blood cells.  Since hospital discharge she has been fatigue and is clearly depressed. Today's interview she has very tearful.    HPI:She underwent a radical hysterectomy bilateral salpingo-oophorectomy and pelvic lymphadenectomy in March of 2005 for a stage IB cervical cancer. Final pathology showed no residual disease and all lymph nodes were negative.  The patient was followed for 5 years with no evidence of recurrent disease until she developed a recurrence of the right pelvic sidewall in February of 2010. This caused obstruction of the right ureter. She was treated with radiation therapy and concurrent cisplatin chemotherapy.  In late 2011 the patient was found to have recurrence of the right inguinal lymph node  and left pelvic nodes and received additional radiation therapy to those areas. This radiation was completed in January 2012. In April of 2012 the patient had resolution of the right inguinal node. In August 2012 the patient was found to have a liver lesion assuring 2.8 cm and received 6 cycles of Taxol as noted above. She had a partial response to chemo, but theliver lesion began to increase in size (1.9 cm). Reimaging showed that there were no other lesions except the liver and we elected to ablate the liver lesion using RFA. This was performed on 10/07/2011. The liver procedure itself went well L. 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   Subsequent treatments included radiation therapy (it lymph nodes June August and September 2013. In November 2013 she was found at November of metastases and small left inguinal lymph nodes. She resume chemotherapy using low-dose carboplatin and Taxol between December and January 2014. This is complicated by neutropenia requiring decreased dosing. We staging CT scan in February 2014 showed increase in size liver metastases. She received a first dose of Alimta in May of 2014.   Review of Systems:10 point review of systems is negative as noted above.   Vitals: Blood pressure 108/80, pulse 66, temperature 97.8 F (36.6 C), temperature source Oral, resp. rate 18, height 5' 1.5" (1.562 m), weight 178 lb 4.8 oz (80.876 kg).  Physical Exam: General : The patient is a healthy woman in no acute distress.  HEENT: normocephalic, extraoccular movements normal; neck is supple without thyromegally  Lynphnodes: Supraclavicular and inguinal nodes not enlarged there is a draining sinus tract in the right inguinal region. Abdomen: Soft, non-tender, no ascites, no organomegally, no masses, no hernias  Pelvic:  EGBUS: Normal female there is edema over the  mons. Vagina: Normal, no lesions  Urethra and Bladder: Normal, non-tender  Cervix: Surgically absent  Uterus: Surgically absent  Bi-manual examination: Non-tender; no adenxal masses or nodularity  Rectal: normal sphincter tone, no masses, no blood  Lower extremities: Moderate edema or varicosities. Normal range of motion    Assessment/Plan: Recurrent carcinoma the cervix in the periaortic region. It is my understanding that this region is not been previously radiated and therefore I would recommend the patient be seen by Dr. Arnette Schaumann for consideration of radiation to the enlarged lymph nodes. Previously the patient is had good responses to radiating pelvic and inguinal nodes.  With regard to her chronic constipation, I would like to refer her back to her gastroenterologist at [facility] health care for further evaluation.  Regarding her chronic pain, I would suggest that she be given OxyContin 10 mg every 12 hours and use Percocet for breakthrough pain.  She is given an appointment to see Dr. Rosanne Ashing, entered on July 31 at 2:30 PM. Allergies  Allergen Reactions  . Codeine Rash and Other (See Comments)    Breathing issues  . Flecainide Acetate Other (See Comments)    Doesn't remember reaction  . Morphine Other (See Comments)     Hallucinations  . Propafenone Hcl Other (See Comments)    Leg cramps    Past Medical History  Diagnosis Date  . Diastolic dysfunction   . MIGRAINE HEADACHE   . Atrial fibrillation     failed DCCV, CHADs2-prev pradaxa stopped due to hematuria with ureter issues/JJ   . Anemia   . HTN (hypertension)   . GLAUCOMA   . CARPAL TUNNEL SYNDROME, RIGHT   . Lymphedema     L>R leg from pelvic XRT/scarring  . Hepatic steatosis     CT scan 06/2009, 12/2009  . DVT (deep venous thrombosis) 10/2010    LLE, anticoag resumed  . CHF (congestive heart failure)     1 yr ago per pt  . Arthritis   . GERD (gastroesophageal reflux disease)   . Small kidney     left  .  Radiation 10/11/11-11/19/11    5040 cGy in 28 fx's/periaortic  . Radiation 03/16/10-03/25/10    right inguinal node/left external iliac node  . Radiation 06/11/08-07/23/08    6000 cGy left pelvis  . Hyperlipidemia   . Leg swelling     left  . Cervical cancer dx 04/2003, recur 04/2008    s/p debulk (TAHBSO), chemo/XRT; recurrent dz L pelvis dx 2010 and liver met 09/2010 s/p RFA  . Osteoporosis with fracture 2014    compression fx, s/p KP ; started prolia 06/2012- narcotic pain mgmt    Past Surgical History  Procedure Laterality Date  . Ureteral stent placement  2005, 01/2010, 07/2010    L hydro related to cervical ca and XRT  . Oophorectomy  2005  . Cardioversion    . Tonsillectomy    . Cholecystectomy  1984  . Total abdominal hysterectomy  2005  . Carpal tunnel  2009    right  . Ivc filter  04/2011    Current Outpatient Prescriptions  Medication Sig Dispense Refill  . bisacodyl (DULCOLAX) 5 MG EC tablet Take 5 mg by mouth daily as needed for constipation.      [name]  calcium-vitamin D (OSCAL WITH D) 500-200 MG-UNIT per tablet Take 1 tablet by mouth every morning.      . cetirizine (ZYRTEC) 10 MG tablet Take 10 mg by mouth every evening.       . fentaNYL (DURAGESIC - DOSED MCG/HR) 100 MCG/HR Place 1 patch (100 mcg total) onto the skin every 3 (three) days. Total dose 125mg   10 patch  0  . fentaNYL (DURAGESIC - DOSED MCG/HR) 25 MCG/HR Place 1 patch (25 mcg total) onto the skin every 3 (three) days. Total dose of 125 mcg  10 patch  0  . ferrous fumarate (HEMOCYTE - 106 MG FE) 325 (106 FE) MG TABS Take 1 tablet (106 mg of iron total) by mouth daily.  30 each  0  . folic acid (FOLVITE) 1 MG tablet Take 1 mg by mouth every morning.      [name] HYDROmorphone (DILAUDID) 4 MG tablet Take 1 tablet every 4 hours as needed for pain  120 tablet  0  . ibuprofen (ADVIL,MOTRIN) 200 MG tablet Take 600 mg by mouth 3 (three) times daily as needed for pain. Pain      . lidocaine-prilocaine (EMLA) cream Apply 1  application topically daily as needed (for port access).      . metoprolol (LOPRESSOR) 50 MG tablet Take 1 tablet (50 mg total) by mouth 2 (two) times daily.  60 tablet  6  . omeprazole (PRILOSEC) 20 MG capsule Take 20 mg by mouth every morning.       . polyethylene glycol (MIRALAX / GLYCOLAX) packet Take 17 g by mouth 2 (two) times daily as needed (constipation).       . Rivaroxaban (XARELTO) 20 MG TABS Take 1 tablet (20 mg total) by mouth every evening.  30 tablet  5  . sodium chloride (OCEAN) 0.65 % nasal spray Place 1 spray into the nose daily as needed for congestion. Allergies      . triamcinolone (NASACORT) 55 MCG/ACT nasal inhaler Place 2 sprays into the nose daily as needed (allergies or congestion).       [name] zolpidem (AMBIEN) 5 MG tablet Take 1 tablet (5 mg total) by mouth at bedtime as needed for sleep.  20 tablet  0  . LORazepam (ATIVAN) 1 MG tablet Take 1 mg by mouth every 6 (six) hours as needed (for nausea (may put under tongue)).       [name] ondansetron (ZOFRAN) 8 MG tablet Take by mouth every 8 (eight) hours as needed for nausea.      . protein supplement (UNJURY CHICKEN SOUP) POWD Take 7 g (2 oz total) by mouth 4 (four) times daily.  210 g  0   No current facility-administered medications for this visit.    History   Social History  . Marital Status: Single    Spouse Name: N/A    Number of Children: N/A  . Years of Education: N/A   Occupational History  . Not on file.   Social History Main Topics  . Smoking status: Former Smoker -- 0.50 packs/day for 3 years    Quit date: 03/01/1984  . Smokeless tobacco: Never Used  . Alcohol Use: No  . Drug Use: No  . Sexually Active: No   Other Topics Concern  . Not on file   Social History Narrative   Lives with significant other in [place], [place].   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   has grown son and 2 g-sons    Family History  Problem  Relation Age of Onset  . Lung cancer    . Hypertension    . Heart disease    . Stroke     . Asthma    . Asthma Son   . Cancer Mother     lung  . Stroke Father       Jeannette Corpus, MD 10/03/2012, 12:55 PM

## 2012-10-03 NOTE — Patient Instructions (Addendum)
Implanted Port Instructions  An implanted port is a central line that has a round shape and is placed under the skin. It is used for long-term IV (intravenous) access for:  · Medicine.  · Fluids.  · Liquid nutrition, such as TPN (total parenteral nutrition).  · Blood samples.  Ports can be placed:  · In the chest area just below the collarbone (this is the most common place.)  · In the arms.  · In the belly (abdomen) area.  · In the legs.  PARTS OF THE PORT  A port has 2 main parts:  · The reservoir. The reservoir is round, disc-shaped, and will be a small, raised area under your skin.  · The reservoir is the part where a needle is inserted (accessed) to either give medicines or to draw blood.  · The catheter. The catheter is a long, slender tube that extends from the reservoir. The catheter is placed into a large vein.  · Medicine that is inserted into the reservoir goes into the catheter and then into the vein.  INSERTION OF THE PORT  · The port is surgically placed in either an operating room or in a procedural area (interventional radiology).  · Medicine may be given to help you relax during the procedure.  · The skin where the port will be inserted is numbed (local anesthetic).  · 1 or 2 small cuts (incisions) will be made in the skin to insert the port.  · The port can be used after it has been inserted.  INCISION SITE CARE  · The incision site may have small adhesive strips on it. This helps keep the incision site closed. Sometimes, no adhesive strips are placed. Instead of adhesive strips, a special kind of surgical glue is used to keep the incision closed.  · If adhesive strips were placed on the incision sites, do not take them off. They will fall off on their own.  · The incision site may be sore for 1 to 2 days. Pain medicine can help.  · Do not get the incision site wet. Bathe or shower as directed by your caregiver.  · The incision site should heal in 5 to 7 days. A small scar may form after the  incision has healed.  ACCESSING THE PORT  Special steps must be taken to access the port:  · Before the port is accessed, a numbing cream can be placed on the skin. This helps numb the skin over the port site.  · A sterile technique is used to access the port.  · The port is accessed with a needle. Only "non-coring" port needles should be used to access the port. Once the port is accessed, a blood return should be checked. This helps ensure the port is in the vein and is not clogged (clotted).  · If your caregiver believes your port should remain accessed, a clear (transparent) bandage will be placed over the needle site. The bandage and needle will need to be changed every week or as directed by your caregiver.  · Keep the bandage covering the needle clean and dry. Do not get it wet. Follow your caregiver's instructions on how to take a shower or bath when the port is accessed.  · If your port does not need to stay accessed, no bandage is needed over the port.  FLUSHING THE PORT  Flushing the port keeps it from getting clogged. How often the port is flushed depends on:  · If a   constant infusion is running. If a constant infusion is running, the port may not need to be flushed.  · If intermittent medicines are given.  · If the port is not being used.  For intermittent medicines:  · The port will need to be flushed:  · After medicines have been given.  · After blood has been drawn.  · As part of routine maintenance.  · A port is normally flushed with:  · Normal saline.  · Heparin.  · Follow your caregiver's advice on how often, how much, and the type of flush to use on your port.  IMPORTANT PORT INFORMATION  · Tell your caregiver if you are allergic to heparin.  · After your port is placed, you will get a manufacturer's information card. The card has information about your port. Keep this card with you at all times.  · There are many types of ports available. Know what kind of port you have.  · In case of an  emergency, it may be helpful to wear a medical alert bracelet. This can help alert health care workers that you have a port.  · The port can stay in for as long as your caregiver believes it is necessary.  · When it is time for the port to come out, surgery will be done to remove it. The surgery will be similar to how the port was put in.  · If you are in the hospital or clinic:  · Your port will be taken care of and flushed by a nurse.  · If you are at home:  · A home health care nurse may give medicines and take care of the port.  · You or a family member can get special training and directions for giving medicine and taking care of the port at home.  SEEK IMMEDIATE MEDICAL CARE IF:   · Your port does not flush or you are unable to get a blood return.  · New drainage or pus is coming from the incision.  · A bad smell is coming from the incision site.  · You develop swelling or increased redness at the incision site.  · You develop increased swelling or pain at the port site.  · You develop swelling or pain in the surrounding skin near the port.  · You have an oral temperature above 102° F (38.9° C), not controlled by medicine.  MAKE SURE YOU:   · Understand these instructions.  · Will watch your condition.  · Will get help right away if you are not doing well or get worse.  Document Released: 02/15/2005 Document Revised: 05/10/2011 Document Reviewed: 05/09/2008  [employer]® Patient Information ©2014 [employer].

## 2012-10-03 NOTE — Patient Instructions (Signed)
Prescriptions:  Celexa 20 mg daily for one week and then increase to 40 mg daily thereafter.  Megace elixir 10 cc every morning to stimulate appetite  We'll contact you with an appointment to see Dr. Welton Flakes to resume chemotherapy.

## 2012-10-04 ENCOUNTER — Telehealth: Payer: Self-pay | Admitting: Oncology

## 2012-10-04 ENCOUNTER — Other Ambulatory Visit: Payer: Self-pay | Admitting: Gynecologic Oncology

## 2012-10-04 DIAGNOSIS — C539 Malignant neoplasm of cervix uteri, unspecified: Secondary | ICD-10-CM

## 2012-10-04 NOTE — Telephone Encounter (Signed)
PT APPT 08/12 @ 2 W/DR. [facility] DATE/TIME PER MD

## 2012-10-05 ENCOUNTER — Telehealth: Payer: Self-pay | Admitting: Oncology

## 2012-10-05 NOTE — Telephone Encounter (Signed)
C/D 10/05/12 for appt. 10/10/12

## 2012-10-10 ENCOUNTER — Other Ambulatory Visit: Payer: 59 | Admitting: Lab

## 2012-10-10 ENCOUNTER — Telehealth: Payer: Self-pay | Admitting: *Deleted

## 2012-10-10 ENCOUNTER — Ambulatory Visit: Payer: 59

## 2012-10-10 ENCOUNTER — Ambulatory Visit (HOSPITAL_BASED_OUTPATIENT_CLINIC_OR_DEPARTMENT_OTHER): Payer: 59 | Admitting: Oncology

## 2012-10-10 ENCOUNTER — Encounter: Payer: Self-pay | Admitting: Oncology

## 2012-10-10 DIAGNOSIS — F329 Major depressive disorder, single episode, unspecified: Secondary | ICD-10-CM

## 2012-10-10 DIAGNOSIS — C787 Secondary malignant neoplasm of liver and intrahepatic bile duct: Secondary | ICD-10-CM

## 2012-10-10 DIAGNOSIS — C539 Malignant neoplasm of cervix uteri, unspecified: Secondary | ICD-10-CM

## 2012-10-10 DIAGNOSIS — C770 Secondary and unspecified malignant neoplasm of lymph nodes of head, face and neck: Secondary | ICD-10-CM

## 2012-10-10 DIAGNOSIS — C778 Secondary and unspecified malignant neoplasm of lymph nodes of multiple regions: Secondary | ICD-10-CM

## 2012-10-10 MED ORDER — CYANOCOBALAMIN 1000 MCG/ML IJ SOLN: 1000.0000 ug | Freq: Once | INTRAMUSCULAR | Status: DC

## 2012-10-10 MED ORDER — CYANOCOBALAMIN 1000 MCG/ML IJ SOLN
1000.0000 ug | Freq: Once | INTRAMUSCULAR | Status: AC
  Administered 2012-10-10: 1000 ug via INTRAMUSCULAR

## 2012-10-10 NOTE — Telephone Encounter (Signed)
Per staff message and POF I have scheduled appts.  JMW  

## 2012-10-10 NOTE — Patient Instructions (Addendum)
We discussed your diagnosis and treatment options  We discussed getting staging CT scans.  Begin B12 injections today and with your chemotherapy per protocol  Continue folic acid  We will begin alimta on 8/28, we will add neulasta on day of your chemotherapy to help prevent neutropenia (lower of white counts)

## 2012-10-10 NOTE — Telephone Encounter (Signed)
appts made and printed.pt is aware that tx will follow after her appt 8/28. i emailed MW to add the pt tx's. Pt is aware that cs will her her w/ appt d/t for her CT ABD and CT chest...td

## 2012-10-10 NOTE — Progress Notes (Signed)
Checked in new pt with no financial concerns. °

## 2012-10-11 ENCOUNTER — Telehealth: Payer: Self-pay | Admitting: *Deleted

## 2012-10-11 NOTE — Telephone Encounter (Signed)
Had made an appt for chemo edu class, but the pt is a transfer from [facility] and already complete chemo edu....td

## 2012-10-13 ENCOUNTER — Ambulatory Visit (HOSPITAL_COMMUNITY)
Admission: RE | Admit: 2012-10-13 | Discharge: 2012-10-13 | Disposition: A | Discharge status: Home / Self Care | Payer: 59 | Source: Ambulatory Visit | Attending: Oncology | Admitting: Oncology

## 2012-10-13 ENCOUNTER — Encounter (HOSPITAL_COMMUNITY): Payer: Self-pay

## 2012-10-13 DIAGNOSIS — Z90710 Acquired absence of both cervix and uterus: Secondary | ICD-10-CM | POA: Insufficient documentation

## 2012-10-13 DIAGNOSIS — N269 Renal sclerosis, unspecified: Secondary | ICD-10-CM | POA: Insufficient documentation

## 2012-10-13 DIAGNOSIS — C787 Secondary malignant neoplasm of liver and intrahepatic bile duct: Secondary | ICD-10-CM | POA: Insufficient documentation

## 2012-10-13 DIAGNOSIS — K838 Other specified diseases of biliary tract: Secondary | ICD-10-CM | POA: Insufficient documentation

## 2012-10-13 DIAGNOSIS — C539 Malignant neoplasm of cervix uteri, unspecified: Secondary | ICD-10-CM | POA: Insufficient documentation

## 2012-10-13 DIAGNOSIS — C770 Secondary and unspecified malignant neoplasm of lymph nodes of head, face and neck: Secondary | ICD-10-CM

## 2012-10-13 MED ORDER — IOHEXOL 300 MG/ML  SOLN
100.0000 mL | Freq: Once | INTRAMUSCULAR | Status: AC | PRN
  Administered 2012-10-13: 100 mL via INTRAVENOUS

## 2012-10-16 ENCOUNTER — Telehealth: Payer: Self-pay | Admitting: Internal Medicine

## 2012-10-16 ENCOUNTER — Other Ambulatory Visit: Payer: Self-pay | Admitting: Oncology

## 2012-10-17 ENCOUNTER — Other Ambulatory Visit: Payer: Self-pay | Admitting: *Deleted

## 2012-10-17 DIAGNOSIS — C539 Malignant neoplasm of cervix uteri, unspecified: Secondary | ICD-10-CM

## 2012-10-17 MED ORDER — FENTANYL 100 MCG/HR TD PT72: 1.0000 | MEDICATED_PATCH | TRANSDERMAL | Status: DC

## 2012-10-17 MED ORDER — HYDROMORPHONE HCL 4 MG PO TABS: ORAL_TABLET | ORAL | Status: DC

## 2012-10-17 MED ORDER — FENTANYL 25 MCG/HR TD PT72: 1.0000 | MEDICATED_PATCH | TRANSDERMAL | Status: DC

## 2012-10-17 NOTE — Telephone Encounter (Signed)
rx for Duragesic patches -25 and 100 - done Dilaudid has been filled by oncology

## 2012-10-19 ENCOUNTER — Other Ambulatory Visit: Payer: 59

## 2012-10-23 NOTE — Progress Notes (Signed)
OFFICE PROGRESS NOTE  CC**  [name] Paci, MD [address] [facility] [address] [place] [address] Dr. Loree Fee  DIAGNOSIS: 62 year old female with metastatic cervical carcinoma  PRIOR THERAPY:  #1 patient underwent a radical hysterectomy bilateral salpingo-oophorectomy and pelvic lymphadenectomy in March 2005 for his stage IB cervical cancer. Her final pathology showed no residual disease in all lymph nodes were negative.  #2 patient was followed for 5 years with no evidence of recurrent disease until she developed a recurrence of the right pelvic sidewall in [place] 2010. This caused obstruction of the right ureter. She was treated with radiation therapy and concurrent cis-platinum chemotherapy.  #3 in late 2011 patient was found to have recurrence of the right inguinal lymph node and left pelvic nodes and received additional radiation therapy to those areas. His radiation was completed in January 2012. In April 2012 the patient had resolution of the right inguinal nodes. In August 2012 the patient was found to have a liver lesion which sure and 2.8 cm and received 6 cycles of Taxol. Patient had partial response to chemotherapy but the liver lesion began to increase to a size of 1.9. Reimaging showed that there were no other lesions except the liver and patient elected to a plate the liver lesion using RFA he performed a day 2013. The procedure went well but patient developed an extensive DVT in the left lower extremity and was placed on Lovenox and IVC filter  #4 patient subsequent treatments include of radiation therapy in June and August and September 2013. November 2013 she was found to have metastasis and small left inguinal lymph nodes. She resumed chemotherapy using low dose carboplatinum and Taxol between December and January 2014. This was complicated by neutropenia. Patient had CT scans in [place] 2004 that showed increase in size of the liver metastasis. She received  alimta.in May 2014.  #5 patient is now seen by me for resuming the Alimta  CURRENT THERAPY: alimta q 21 days  INTERVAL HISTORY: [name] 62 y.o. female returns for initial visit with me. She had been seeing Dr. PSA. Clinically she seems to be doing well she is significantly depressed. She has decreased appetite. She has a prescription for Megace elixir 10 cc daily. She is also on Celexa for depression but has been increased to 40 mg a day. She has no fevers chills or night sweats no Donald pain. [name] is negative  MEDICAL HISTORY: Past Medical History  Diagnosis Date  . Diastolic dysfunction   . MIGRAINE HEADACHE   . Atrial fibrillation     failed DCCV, CHADs2-prev pradaxa stopped due to hematuria with ureter issues/JJ   . Anemia   . HTN (hypertension)   . GLAUCOMA   . CARPAL TUNNEL SYNDROME, RIGHT   . Lymphedema     L>R leg from pelvic XRT/scarring  . Hepatic steatosis     CT scan 06/2009, 12/2009  . DVT (deep venous thrombosis) 10/2010    LLE, anticoag resumed  . CHF (congestive heart failure)     1 yr ago per pt  . Arthritis   . GERD (gastroesophageal reflux disease)   . Small kidney     left  . Radiation 10/11/11-11/19/11    5040 cGy in 28 fx's/periaortic  . Radiation 03/16/10-03/25/10    right inguinal node/left external iliac node  . Radiation 06/11/08-07/23/08    6000 cGy left pelvis  . Hyperlipidemia   . Leg  swelling     left  . Cervical cancer dx 04/2003, recur 04/2008    s/p debulk (TAHBSO), chemo/XRT; recurrent dz L pelvis dx 2010 and liver met 09/2010 s/p RFA  . Osteoporosis with fracture 2014    compression fx, s/p KP ; started prolia 06/2012- narcotic pain mgmt    ALLERGIES:  is allergic to codeine; flecainide acetate; morphine; and propafenone hcl.  MEDICATIONS:  Current Outpatient Prescriptions  Medication Sig Dispense Refill  . bisacodyl (DULCOLAX) 5 MG EC tablet Take 5 mg by mouth daily as needed for constipation.       . calcium-vitamin D (OSCAL WITH D) 500-200 MG-UNIT per tablet Take 1 tablet by mouth every morning.      . cetirizine (ZYRTEC) 10 MG tablet Take 10 mg by mouth every evening.       . ferrous fumarate (HEMOCYTE - 106 MG FE) 325 (106 FE) MG TABS Take 1 tablet (106 mg of iron total) by mouth daily.  30 each  0  . folic acid (FOLVITE) 1 MG tablet Take 1 mg by mouth every morning.      [name] ibuprofen (ADVIL,MOTRIN) 200 MG tablet Take 600 mg by mouth 3 (three) times daily as needed for pain. Pain      . lidocaine-prilocaine (EMLA) cream Apply 1 application topically daily as needed (for port access).      . LORazepam (ATIVAN) 1 MG tablet Take 1 mg by mouth every 6 (six) hours as needed (for nausea (may put under tongue)).       . metoprolol (LOPRESSOR) 50 MG tablet Take 1 tablet (50 mg total) by mouth 2 (two) times daily.  60 tablet  6  . omeprazole (PRILOSEC) 20 MG capsule Take 20 mg by mouth every morning.       . ondansetron (ZOFRAN) 8 MG tablet Take by mouth every 8 (eight) hours as needed for nausea.      . polyethylene glycol (MIRALAX / GLYCOLAX) packet Take 17 g by mouth 2 (two) times daily as needed (constipation).       . protein supplement (UNJURY CHICKEN SOUP) POWD Take 7 g (2 oz total) by mouth 4 (four) times daily.  210 g  0  . Rivaroxaban (XARELTO) 20 MG TABS Take 1 tablet (20 mg total) by mouth every evening.  30 tablet  5  . sodium chloride (OCEAN) 0.65 % nasal spray Place 1 spray into the nose daily as needed for congestion. Allergies      . triamcinolone (NASACORT) 55 MCG/ACT nasal inhaler Place 2 sprays into the nose daily as needed (allergies or congestion).       [name] zolpidem (AMBIEN) 5 MG tablet Take 1 tablet (5 mg total) by mouth at bedtime as needed for sleep.  20 tablet  0  . citalopram (CELEXA) 20 MG tablet       . fentaNYL (DURAGESIC - DOSED MCG/HR) 100 MCG/HR Place 1 patch (100 mcg total) onto the skin every 3 (three) days. Total dose 125mg   10 patch  0  . fentaNYL (DURAGESIC -  DOSED MCG/HR) 25 MCG/HR patch Place 1 patch (25 mcg total) onto the skin every 3 (three) days. Total dose of 125 mcg  10 patch  0  . HYDROmorphone (DILAUDID) 4 MG tablet Take 1 tablet every 4 hours as needed for pain  120 tablet  0  . megestrol (MEGACE) 40 MG/ML suspension        No current facility-administered medications for this visit.  SURGICAL HISTORY:  Past Surgical History  Procedure Laterality Date  . Ureteral stent placement  2005, 01/2010, 07/2010    L hydro related to cervical ca and XRT  . Oophorectomy  2005  . Cardioversion    . Tonsillectomy    . Cholecystectomy  1984  . Total abdominal hysterectomy  2005  . Carpal tunnel  2009    right  . Ivc filter  04/2011    REVIEW OF SYSTEMS:  Pertinent items are noted in HPI.   HEALTH MAINTENANCE:  PHYSICAL EXAMINATION: There were no vitals taken for this visit. There is no weight on file to calculate BMI. ECOG PERFORMANCE STATUS: 0 - Asymptomatic   General appearance: alert, cooperative and appears stated age Resp: clear to auscultation bilaterally Cardio: regular rate and rhythm GI: soft, non-tender; bowel sounds normal; no masses,  no organomegaly Extremities: extremities normal, atraumatic, no cyanosis or edema Neurologic: Grossly normal   LABORATORY DATA: Lab Results  Component Value Date   WBC 4.8 10/03/2012   HGB 10.7* 10/03/2012   HCT 31.5* 10/03/2012   MCV 83.8 10/03/2012   PLT 222 10/03/2012      Chemistry      Component Value Date/Time   NA 138 10/03/2012 1128   NA 137 08/04/2012 0452   K 4.1 10/03/2012 1128   K 3.7 08/04/2012 0452   CL 108* 08/15/2012 1252   CL 105 08/04/2012 0452   CO2 21* 10/03/2012 1128   CO2 25 08/04/2012 0452   BUN 14.5 10/03/2012 1128   BUN 4* 08/04/2012 0452   CREATININE 0.9 10/03/2012 1128   CREATININE 0.60 08/04/2012 0452   CREATININE 1.45* 09/11/2010 1603      Component Value Date/Time   CALCIUM 9.5 10/03/2012 1128   CALCIUM 7.5* 08/04/2012 0452   ALKPHOS 202* 10/03/2012 1128   ALKPHOS 383*  08/01/2012 0350   AST 17 10/03/2012 1128   AST 20 08/01/2012 0350   ALT 14 10/03/2012 1128   ALT 50* 08/01/2012 0350   BILITOT 0.37 10/03/2012 1128   BILITOT 0.4 08/01/2012 0350       RADIOGRAPHIC STUDIES:  Ct Chest W Contrast  10/13/2012   *RADIOLOGY REPORT*  Clinical Data:  Cervical cancer with metastasis.  Evaluate progression.  Restaging  CT CHEST, ABDOMEN AND PELVIS WITH CONTRAST  Technique:  Multidetector CT imaging of the chest, abdomen and pelvis was performed following the standard protocol during bolus administration of intravenous contrast.  Contrast: OMNIPAQUE IOHEXOL 300 MG/ML  SOLN  Comparison:  CT 07/29/2012  CT CHEST  Findings:  No axillary or supraclavicular lymphadenopathy.  There is a port in the right anterior chest wall.  No mediastinal hilar lymphadenopathy.  No pericardial fluid.  Review of the lung parenchyma demonstrates no new or suspicious pulmonary nodules.  IMPRESSION: No evidence of thoracic metastasis.  CT ABDOMEN AND PELVIS  Findings:  The right hepatic lobe with metastasis are stable to slightly increased in size compared to prior.  One lesion which is increased is as along the falciform ligament in the right hepatic lobe measuring 3.8 x 6.1 cm compared to 3.6 x 4.7 cm on prior. Lesion in the posterior right hepatic lobe measures 3.6 x 3.9 cm unchanged from the 3.8 x 3.9 cm.  Lesions superior right hepatic lobe measures 3.9 x 4.3 cm compared to 4.2 x 4.4 cm on prior. There is some mild peripheral ductal dilatation  within the right hepatic lobe which appears increased compared to prior.  Mild duct dilatation of the  left is similar.  Patient status post partial hepatectomy.  No significant periportal adenopathy.  7 mm periportal lymph node is unchanged (image 57).  The pancreas, spleen, adrenal glands, and right kidney normal.  The left kidney is atrophic.  There is an IVC filter inferior to the right renal vein.  No retroperitoneal or retrocrural adenopathy.  The stomach in a  review of small bowel and colon unremarkable.  Post hysterectomy anatomy.  The bladder is collapsed.  No pelvic lymphadenopathy.  There is a low density collection along the left external iliac vessels measuring 31 x 21 mm which is unchanged in volume compared to prior.  This has simple fluid attenuation centrally likely represents a postsurgical fluid collection.  No evidence of pelvic lymphadenopathy. Review of  bone windows demonstrates no aggressive osseous lesions.  IMPRESSION:  1.  Essentially stable hepatic metastasis primarily in the right hepatic lobe.  One lesion measures larger. 2.  Essentially stable intrahepatic biliary duct dilatation. 3.  Atrophic left kidney. 4.  Hysterectomy with no evidence of local recurrence.   Original Report Authenticated By: Genevive Bi, M.D.   Ct Abdomen Pelvis W Contrast  10/13/2012   *RADIOLOGY REPORT*  Clinical Data:  Cervical cancer with metastasis.  Evaluate progression.  Restaging  CT CHEST, ABDOMEN AND PELVIS WITH CONTRAST  Technique:  Multidetector CT imaging of the chest, abdomen and pelvis was performed following the standard protocol during bolus administration of intravenous contrast.  Contrast: OMNIPAQUE IOHEXOL 300 MG/ML  SOLN  Comparison:  CT 07/29/2012  CT CHEST  Findings:  No axillary or supraclavicular lymphadenopathy.  There is a port in the right anterior chest wall.  No mediastinal hilar lymphadenopathy.  No pericardial fluid.  Review of the lung parenchyma demonstrates no new or suspicious pulmonary nodules.  IMPRESSION: No evidence of thoracic metastasis.  CT ABDOMEN AND PELVIS  Findings:  The right hepatic lobe with metastasis are stable to slightly increased in size compared to prior.  One lesion which is increased is as along the falciform ligament in the right hepatic lobe measuring 3.8 x 6.1 cm compared to 3.6 x 4.7 cm on prior. Lesion in the posterior right hepatic lobe measures 3.6 x 3.9 cm unchanged from the 3.8 x 3.9 cm.  Lesions  superior right hepatic lobe measures 3.9 x 4.3 cm compared to 4.2 x 4.4 cm on prior. There is some mild peripheral ductal dilatation  within the right hepatic lobe which appears increased compared to prior.  Mild duct dilatation of the left is similar.  Patient status post partial hepatectomy.  No significant periportal adenopathy.  7 mm periportal lymph node is unchanged (image 57).  The pancreas, spleen, adrenal glands, and right kidney normal.  The left kidney is atrophic.  There is an IVC filter inferior to the right renal vein.  No retroperitoneal or retrocrural adenopathy.  The stomach in a review of small bowel and colon unremarkable.  Post hysterectomy anatomy.  The bladder is collapsed.  No pelvic lymphadenopathy.  There is a low density collection along the left external iliac vessels measuring 31 x 21 mm which is unchanged in volume compared to prior.  This has simple fluid attenuation centrally likely represents a postsurgical fluid collection.  No evidence of pelvic lymphadenopathy. Review of  bone windows demonstrates no aggressive osseous lesions.  IMPRESSION:  1.  Essentially stable hepatic metastasis primarily in the right hepatic lobe.  One lesion measures larger. 2.  Essentially stable intrahepatic biliary duct dilatation. 3.  Atrophic  left kidney. 4.  Hysterectomy with no evidence of local recurrence.   Original Report Authenticated By: Genevive Bi, M.D.    ASSESSMENT: 62 year old female with  #1 metastatic cervical carcinoma with liver metastasis. Patient has been treated with multiple therapeutic agents.  #2 patient's last treatment was with Alimta to be given every 21 days. She received cycle 1 of 07/21/2012. Thereafter due to complication was discontinued. She now with progressive disease there for she will resume cycle 2 on 10/26/2012. We have discussed using Neulasta on day 2 of her treatment to make sure that she does not develop febrile neutropenia as she did in the past. She  is quite scared about this.  #3 depression and anxiety she is on Celexa.  #4 poor appetite she is receiving making   PLAN:   #1 patient will proceed with Alimta on a 28 2014 this will be cycle 2 day 1.  #2 I will see her back on that day as well.   All questions were answered. The patient knows to call the clinic with any problems, questions or concerns. We can certainly see the patient much sooner if necessary.  I spent 25 minutes counseling the patient face to face. The total time spent in the appointment was 30 minutes.    Drue Second, MD Medical/Oncology [facility] [phone] (beeper) [phone] (Office)

## 2012-10-26 ENCOUNTER — Encounter: Payer: Self-pay | Admitting: Oncology

## 2012-10-26 ENCOUNTER — Ambulatory Visit (HOSPITAL_BASED_OUTPATIENT_CLINIC_OR_DEPARTMENT_OTHER): Payer: 59

## 2012-10-26 ENCOUNTER — Telehealth: Payer: Self-pay | Admitting: Oncology

## 2012-10-26 ENCOUNTER — Ambulatory Visit (HOSPITAL_BASED_OUTPATIENT_CLINIC_OR_DEPARTMENT_OTHER): Payer: 59 | Admitting: Oncology

## 2012-10-26 ENCOUNTER — Other Ambulatory Visit (HOSPITAL_BASED_OUTPATIENT_CLINIC_OR_DEPARTMENT_OTHER): Payer: 59 | Admitting: Lab

## 2012-10-26 VITALS — BP 97/64 | HR 76 | Temp 98.0°F | Resp 20 | Ht 61.5 in | Wt 175.3 lb

## 2012-10-26 DIAGNOSIS — E860 Dehydration: Secondary | ICD-10-CM

## 2012-10-26 DIAGNOSIS — C787 Secondary malignant neoplasm of liver and intrahepatic bile duct: Secondary | ICD-10-CM

## 2012-10-26 DIAGNOSIS — C539 Malignant neoplasm of cervix uteri, unspecified: Secondary | ICD-10-CM

## 2012-10-26 DIAGNOSIS — Z5111 Encounter for antineoplastic chemotherapy: Secondary | ICD-10-CM

## 2012-10-26 DIAGNOSIS — C770 Secondary and unspecified malignant neoplasm of lymph nodes of head, face and neck: Secondary | ICD-10-CM

## 2012-10-26 LAB — CBC WITH DIFFERENTIAL/PLATELET
Basophils Absolute: 0 10*3/uL (ref 0.0–0.1)
EOS%: 1.4 % (ref 0.0–7.0)
HCT: 32.8 % — ABNORMAL LOW (ref 34.8–46.6)
HGB: 10.6 g/dL — ABNORMAL LOW (ref 11.6–15.9)
LYMPH%: 18.1 % (ref 14.0–49.7)
MCH: 27.6 pg (ref 25.1–34.0)
MCHC: 32.3 g/dL (ref 31.5–36.0)
MCV: 85.4 fL (ref 79.5–101.0)
MONO%: 9.2 % (ref 0.0–14.0)
NEUT%: 70.9 % (ref 38.4–76.8)
Platelets: 237 10*3/uL (ref 145–400)
lymph#: 0.9 10*3/uL (ref 0.9–3.3)

## 2012-10-26 LAB — COMPREHENSIVE METABOLIC PANEL (CC13)
Albumin: 3.2 g/dL — ABNORMAL LOW (ref 3.5–5.0)
Alkaline Phosphatase: 271 U/L — ABNORMAL HIGH (ref 40–150)
BUN: 17.6 mg/dL (ref 7.0–26.0)
Creatinine: 1 mg/dL (ref 0.6–1.1)
Glucose: 75 mg/dl (ref 70–140)
Potassium: 4.4 mEq/L (ref 3.5–5.1)

## 2012-10-26 MED ORDER — DEXAMETHASONE SODIUM PHOSPHATE 10 MG/ML IJ SOLN
10.0000 mg | Freq: Once | INTRAMUSCULAR | Status: AC
  Administered 2012-10-26: 10 mg via INTRAVENOUS

## 2012-10-26 MED ORDER — ONDANSETRON 8 MG/50ML IVPB (CHCC)
8.0000 mg | Freq: Once | INTRAVENOUS | Status: AC
  Administered 2012-10-26: 8 mg via INTRAVENOUS

## 2012-10-26 MED ORDER — SODIUM CHLORIDE 0.9 % IV SOLN
Freq: Once | INTRAVENOUS | Status: AC
  Administered 2012-10-26: 15:00:00 via INTRAVENOUS

## 2012-10-26 MED ORDER — SODIUM CHLORIDE 0.9 % IJ SOLN
10.0000 mL | INTRAMUSCULAR | Status: DC | PRN
  Administered 2012-10-26: 10 mL
  Filled 2012-10-26: qty 10

## 2012-10-26 MED ORDER — HEPARIN SOD (PORK) LOCK FLUSH 100 UNIT/ML IV SOLN
500.0000 [IU] | Freq: Once | INTRAVENOUS | Status: AC | PRN
  Administered 2012-10-26: 500 [IU]
  Filled 2012-10-26: qty 5

## 2012-10-26 MED ORDER — SODIUM CHLORIDE 0.9 % IV SOLN
500.0000 mg/m2 | Freq: Once | INTRAVENOUS | Status: AC
  Administered 2012-10-26: 950 mg via INTRAVENOUS
  Filled 2012-10-26: qty 38

## 2012-10-26 NOTE — Patient Instructions (Addendum)
Proceed with alimta today  Return on 8/29 for neulasta  See me back on 9/4 for follow up and labs  Increase fluid intake, try coconut water

## 2012-10-26 NOTE — Progress Notes (Signed)
Discharged at this time in no distress.  Ambulatory with spouse.  Will return tomorrow at 12:00 pm for injection and IVF.

## 2012-10-26 NOTE — Patient Instructions (Addendum)
[facility] Discharge Instructions for Patients Receiving Chemotherapy  Today you received the following chemotherapy agents Alimta  To help prevent nausea and vomiting after your treatment, we encourage you to take your nausea medication     If you develop nausea and vomiting that is not controlled by your nausea medication, call the clinic.   BELOW ARE SYMPTOMS THAT SHOULD BE REPORTED IMMEDIATELY:  *FEVER GREATER THAN 100.5 F  *CHILLS WITH OR WITHOUT FEVER  NAUSEA AND VOMITING THAT IS NOT CONTROLLED WITH YOUR NAUSEA MEDICATION  *UNUSUAL SHORTNESS OF BREATH  *UNUSUAL BRUISING OR BLEEDING  TENDERNESS IN MOUTH AND THROAT WITH OR WITHOUT PRESENCE OF ULCERS  *URINARY PROBLEMS  *BOWEL PROBLEMS  UNUSUAL RASH Items with * indicate a potential emergency and should be followed up as soon as possible.  Feel free to call the clinic you have any questions or concerns. The clinic phone number is [phone].    

## 2012-10-27 ENCOUNTER — Ambulatory Visit (HOSPITAL_BASED_OUTPATIENT_CLINIC_OR_DEPARTMENT_OTHER): Payer: 59

## 2012-10-27 ENCOUNTER — Ambulatory Visit: Payer: 59

## 2012-10-27 ENCOUNTER — Other Ambulatory Visit: Payer: Self-pay | Admitting: *Deleted

## 2012-10-27 VITALS — BP 120/73 | HR 78 | Temp 97.9°F | Resp 18

## 2012-10-27 DIAGNOSIS — Z5189 Encounter for other specified aftercare: Secondary | ICD-10-CM

## 2012-10-27 DIAGNOSIS — E860 Dehydration: Secondary | ICD-10-CM

## 2012-10-27 DIAGNOSIS — C539 Malignant neoplasm of cervix uteri, unspecified: Secondary | ICD-10-CM

## 2012-10-27 DIAGNOSIS — C787 Secondary malignant neoplasm of liver and intrahepatic bile duct: Secondary | ICD-10-CM

## 2012-10-27 MED ORDER — HEPARIN SOD (PORK) LOCK FLUSH 100 UNIT/ML IV SOLN
500.0000 [IU] | Freq: Once | INTRAVENOUS | Status: AC
  Administered 2012-10-27: 500 [IU] via INTRAVENOUS
  Filled 2012-10-27: qty 5

## 2012-10-27 MED ORDER — PEGFILGRASTIM INJECTION 6 MG/0.6ML
6.0000 mg | Freq: Once | SUBCUTANEOUS | Status: AC
  Administered 2012-10-27: 6 mg via SUBCUTANEOUS
  Filled 2012-10-27: qty 0.6

## 2012-10-27 MED ORDER — SODIUM CHLORIDE 0.9 % IJ SOLN
10.0000 mL | INTRAMUSCULAR | Status: DC | PRN
  Administered 2012-10-27: 10 mL via INTRAVENOUS
  Filled 2012-10-27: qty 10

## 2012-10-27 MED ORDER — SODIUM CHLORIDE 0.9 % IV SOLN
Freq: Once | INTRAVENOUS | Status: AC
  Administered 2012-10-27: 12:00:00 via INTRAVENOUS

## 2012-10-27 NOTE — Patient Instructions (Addendum)
Dehydration, Adult Dehydration is when you lose more fluids from the body than you take in. Vital organs like the kidneys, brain, and heart cannot function without a proper amount of fluids and salt. Any loss of fluids from the body can cause dehydration.  CAUSES   Vomiting.  Diarrhea.  Excessive sweating.  Excessive urine output.  Fever. SYMPTOMS  Mild dehydration  Thirst.  Dry lips.  Slightly dry mouth. Moderate dehydration  Very dry mouth.  Sunken eyes.  Skin does not bounce back quickly when lightly pinched and released.  Dark urine and decreased urine production.  Decreased tear production.  Headache. Severe dehydration  Very dry mouth.  Extreme thirst.  Rapid, weak pulse (more than 100 beats per minute at rest).  Cold hands and feet.  Not able to sweat in spite of heat and temperature.  Rapid breathing.  Blue lips.  Confusion and lethargy.  Difficulty being awakened.  Minimal urine production.  No tears. DIAGNOSIS  Your caregiver will diagnose dehydration based on your symptoms and your exam. Blood and urine tests will help confirm the diagnosis. The diagnostic evaluation should also identify the cause of dehydration. TREATMENT  Treatment of mild or moderate dehydration can often be done at home by increasing the amount of fluids that you drink. It is best to drink small amounts of fluid more often. Drinking too much at one time can make vomiting worse. Refer to the home care instructions below. Severe dehydration needs to be treated at the hospital where you will probably be given intravenous (IV) fluids that contain water and electrolytes. HOME CARE INSTRUCTIONS   Ask your caregiver about specific rehydration instructions.  Drink enough fluids to keep your urine clear or pale yellow.  Drink small amounts frequently if you have nausea and vomiting.  Eat as you normally do.  Avoid:  Foods or drinks high in sugar.  Carbonated  drinks.  Juice.  Extremely hot or cold fluids.  Drinks with caffeine.  Fatty, greasy foods.  Alcohol.  Tobacco.  Overeating.  Gelatin desserts.  Wash your hands well to avoid spreading bacteria and viruses.  Only take over-the-counter or prescription medicines for pain, discomfort, or fever as directed by your caregiver.  Ask your caregiver if you should continue all prescribed and over-the-counter medicines.  Keep all follow-up appointments with your caregiver. SEEK MEDICAL CARE IF:  You have abdominal pain and it increases or stays in one area (localizes).  You have a rash, stiff neck, or severe headache.  You are irritable, sleepy, or difficult to awaken.  You are weak, dizzy, or extremely thirsty. SEEK IMMEDIATE MEDICAL CARE IF:   You are unable to keep fluids down or you get worse despite treatment.  You have frequent episodes of vomiting or diarrhea.  You have blood or green matter (bile) in your vomit.  You have blood in your stool or your stool looks black and tarry.  You have not urinated in 6 to 8 hours, or you have only urinated a small amount of very dark urine.  You have a fever.  You faint. MAKE SURE YOU:   Understand these instructions.  Will watch your condition.  Will get help right away if you are not doing well or get worse. Document Released: 02/15/2005 Document Revised: 05/10/2011 Document Reviewed: 10/05/2010 ExitCare Patient Information 2014 [employer].  

## 2012-10-31 ENCOUNTER — Other Ambulatory Visit: Payer: Self-pay | Admitting: Certified Registered Nurse Anesthetist

## 2012-11-02 ENCOUNTER — Encounter: Payer: Self-pay | Admitting: Oncology

## 2012-11-02 ENCOUNTER — Other Ambulatory Visit (HOSPITAL_BASED_OUTPATIENT_CLINIC_OR_DEPARTMENT_OTHER): Payer: 59

## 2012-11-02 ENCOUNTER — Ambulatory Visit (HOSPITAL_BASED_OUTPATIENT_CLINIC_OR_DEPARTMENT_OTHER): Payer: 59

## 2012-11-02 ENCOUNTER — Other Ambulatory Visit: Payer: Self-pay | Admitting: Medical Oncology

## 2012-11-02 ENCOUNTER — Ambulatory Visit (HOSPITAL_BASED_OUTPATIENT_CLINIC_OR_DEPARTMENT_OTHER): Payer: 59 | Admitting: Oncology

## 2012-11-02 VITALS — BP 108/70 | HR 106 | Temp 98.3°F | Resp 20 | Ht 61.5 in | Wt 171.4 lb

## 2012-11-02 DIAGNOSIS — C787 Secondary malignant neoplasm of liver and intrahepatic bile duct: Secondary | ICD-10-CM

## 2012-11-02 DIAGNOSIS — R112 Nausea with vomiting, unspecified: Secondary | ICD-10-CM

## 2012-11-02 DIAGNOSIS — C539 Malignant neoplasm of cervix uteri, unspecified: Secondary | ICD-10-CM

## 2012-11-02 DIAGNOSIS — E860 Dehydration: Secondary | ICD-10-CM

## 2012-11-02 DIAGNOSIS — F329 Major depressive disorder, single episode, unspecified: Secondary | ICD-10-CM

## 2012-11-02 LAB — COMPREHENSIVE METABOLIC PANEL (CC13)
Albumin: 2.8 g/dL — ABNORMAL LOW (ref 3.5–5.0)
BUN: 10.5 mg/dL (ref 7.0–26.0)
Calcium: 9.3 mg/dL (ref 8.4–10.4)
Chloride: 101 mEq/L (ref 98–109)
Glucose: 188 mg/dl — ABNORMAL HIGH (ref 70–140)
Potassium: 3.9 mEq/L (ref 3.5–5.1)

## 2012-11-02 LAB — CBC WITH DIFFERENTIAL/PLATELET
Basophils Absolute: 0 10*3/uL (ref 0.0–0.1)
Eosinophils Absolute: 0 10*3/uL (ref 0.0–0.5)
HCT: 31.1 % — ABNORMAL LOW (ref 34.8–46.6)
HGB: 10.5 g/dL — ABNORMAL LOW (ref 11.6–15.9)
MCV: 83.2 fL (ref 79.5–101.0)
MONO%: 5.5 % (ref 0.0–14.0)
NEUT#: 4.5 10*3/uL (ref 1.5–6.5)
NEUT%: 85.5 % — ABNORMAL HIGH (ref 38.4–76.8)
RDW: 14.9 % — ABNORMAL HIGH (ref 11.2–14.5)
lymph#: 0.4 10*3/uL — ABNORMAL LOW (ref 0.9–3.3)

## 2012-11-02 MED ORDER — ONDANSETRON 16 MG/50ML IVPB (CHCC)
16.0000 mg | Freq: Once | INTRAVENOUS | Status: AC
  Administered 2012-11-02: 16 mg via INTRAVENOUS

## 2012-11-02 MED ORDER — CIPROFLOXACIN HCL 500 MG PO TABS: 500.0000 mg | ORAL_TABLET | Freq: Two times a day (BID) | ORAL | Status: DC

## 2012-11-02 MED ORDER — SODIUM CHLORIDE 0.9 % IV SOLN
Freq: Once | INTRAVENOUS | Status: AC
  Administered 2012-11-02: 14:00:00 via INTRAVENOUS

## 2012-11-02 NOTE — Patient Instructions (Addendum)
 [facility] Center Discharge Instructions for Patients   Today you received IV fluids and Zofran to help aid with your re-hydration and nausea.   To help prevent further dehydration, nausea, and vomiting, we encourage you to take your nausea medication as prescribed and try to drink plenty of fluids.    If you develop nausea and vomiting that is not controlled by your nausea medication, call the clinic.   BELOW ARE SYMPTOMS THAT SHOULD BE REPORTED IMMEDIATELY:  *FEVER GREATER THAN 100.5 F  *CHILLS WITH OR WITHOUT FEVER  NAUSEA AND VOMITING THAT IS NOT CONTROLLED WITH YOUR NAUSEA MEDICATION  *UNUSUAL SHORTNESS OF BREATH  *UNUSUAL BRUISING OR BLEEDING  TENDERNESS IN MOUTH AND THROAT WITH OR WITHOUT PRESENCE OF ULCERS  *URINARY PROBLEMS  *BOWEL PROBLEMS  UNUSUAL RASH Items with * indicate a potential emergency and should be followed up as soon as possible.  Feel free to call the clinic you have any questions or concerns. The clinic phone number is [phone].

## 2012-11-03 ENCOUNTER — Ambulatory Visit (HOSPITAL_BASED_OUTPATIENT_CLINIC_OR_DEPARTMENT_OTHER): Payer: 59

## 2012-11-03 VITALS — BP 102/73 | HR 85 | Temp 97.9°F | Resp 20

## 2012-11-03 DIAGNOSIS — E860 Dehydration: Secondary | ICD-10-CM

## 2012-11-03 DIAGNOSIS — C787 Secondary malignant neoplasm of liver and intrahepatic bile duct: Secondary | ICD-10-CM

## 2012-11-03 DIAGNOSIS — R112 Nausea with vomiting, unspecified: Secondary | ICD-10-CM

## 2012-11-03 DIAGNOSIS — C539 Malignant neoplasm of cervix uteri, unspecified: Secondary | ICD-10-CM

## 2012-11-03 MED ORDER — SODIUM CHLORIDE 0.9 % IV SOLN
INTRAVENOUS | Status: DC
  Administered 2012-11-03: 16:00:00 via INTRAVENOUS

## 2012-11-03 MED ORDER — ONDANSETRON 16 MG/50ML IVPB (CHCC)
INTRAVENOUS | Status: AC
  Filled 2012-11-03: qty 16

## 2012-11-03 MED ORDER — HEPARIN SOD (PORK) LOCK FLUSH 100 UNIT/ML IV SOLN
500.0000 [IU] | Freq: Once | INTRAVENOUS | Status: AC
  Administered 2012-11-03: 500 [IU] via INTRAVENOUS
  Filled 2012-11-03: qty 5

## 2012-11-03 MED ORDER — ONDANSETRON 16 MG/50ML IVPB (CHCC)
16.0000 mg | Freq: Once | INTRAVENOUS | Status: AC
  Administered 2012-11-03: 16 mg via INTRAVENOUS

## 2012-11-03 MED ORDER — SODIUM CHLORIDE 0.9 % IJ SOLN
10.0000 mL | INTRAMUSCULAR | Status: DC | PRN
  Administered 2012-11-03: 10 mL via INTRAVENOUS
  Filled 2012-11-03: qty 10

## 2012-11-03 NOTE — Patient Instructions (Addendum)
Dehydration, Adult  Dehydration means your body does not have as much fluid as it needs. Your kidneys, brain, and heart will not work properly without the right amount of fluids and salt.   HOME CARE   Ask your doctor how to replace body fluid losses (rehydrate).   Drink enough fluids to keep your pee (urine) clear or pale yellow.   Drink small amounts of fluids often if you feel sick to your stomach (nauseous) or throw up (vomit).   Eat like you normally do.   Avoid:   Foods or drinks high in sugar.   Bubbly (carbonated) drinks.   Juice.   Very hot or cold fluids.   Drinks with caffeine.   Fatty, greasy foods.   Alcohol.   Tobacco.   Eating too much.   Gelatin desserts.   Wash your hands to avoid spreading germs (bacteria, viruses).   Only take medicine as told by your doctor.   Keep all doctor visits as told.  GET HELP RIGHT AWAY IF:    You cannot drink something without throwing up.   You get worse even with treatment.   Your vomit has blood in it or looks greenish.   Your poop (stool) has blood in it or looks black and tarry.   You have not peed in 6 to 8 hours.   You pee a small amount of very dark pee.   You have a fever.   You pass out (faint).   You have belly (abdominal) pain that gets worse or stays in one spot (localizes).   You have a rash, stiff neck, or bad headache.   You get easily annoyed, sleepy, or are hard to wake up.   You feel weak, dizzy, or very thirsty.  MAKE SURE YOU:    Understand these instructions.   Will watch your condition.   Will get help right away if you are not doing well or get worse.  Document Released: 12/12/2008 Document Revised: 05/10/2011 Document Reviewed: 10/05/2010  ExitCare Patient Information 2014 [employer].

## 2012-11-04 ENCOUNTER — Ambulatory Visit (HOSPITAL_BASED_OUTPATIENT_CLINIC_OR_DEPARTMENT_OTHER): Payer: 59

## 2012-11-04 VITALS — BP 125/77 | HR 102 | Temp 98.0°F | Resp 20

## 2012-11-04 DIAGNOSIS — C539 Malignant neoplasm of cervix uteri, unspecified: Secondary | ICD-10-CM

## 2012-11-04 DIAGNOSIS — E860 Dehydration: Secondary | ICD-10-CM

## 2012-11-04 DIAGNOSIS — R112 Nausea with vomiting, unspecified: Secondary | ICD-10-CM

## 2012-11-04 DIAGNOSIS — C787 Secondary malignant neoplasm of liver and intrahepatic bile duct: Secondary | ICD-10-CM

## 2012-11-04 MED ORDER — ONDANSETRON 16 MG/50ML IVPB (CHCC)
16.0000 mg | Freq: Once | INTRAVENOUS | Status: AC
  Administered 2012-11-04: 16 mg via INTRAVENOUS

## 2012-11-04 MED ORDER — SODIUM CHLORIDE 0.9 % IV SOLN
INTRAVENOUS | Status: DC
  Administered 2012-11-04: 09:00:00 via INTRAVENOUS

## 2012-11-04 NOTE — Patient Instructions (Addendum)
Dehydration, Adult Dehydration is when you lose more fluids from the body than you take in. Vital organs like the kidneys, brain, and heart cannot function without a proper amount of fluids and salt. Any loss of fluids from the body can cause dehydration.  CAUSES   Vomiting.  Diarrhea.  Excessive sweating.  Excessive urine output.  Fever. SYMPTOMS  Mild dehydration  Thirst.  Dry lips.  Slightly dry mouth. Moderate dehydration  Very dry mouth.  Sunken eyes.  Skin does not bounce back quickly when lightly pinched and released.  Dark urine and decreased urine production.  Decreased tear production.  Headache. Severe dehydration  Very dry mouth.  Extreme thirst.  Rapid, weak pulse (more than 100 beats per minute at rest).  Cold hands and feet.  Not able to sweat in spite of heat and temperature.  Rapid breathing.  Blue lips.  Confusion and lethargy.  Difficulty being awakened.  Minimal urine production.  No tears. DIAGNOSIS  Your caregiver will diagnose dehydration based on your symptoms and your exam. Blood and urine tests will help confirm the diagnosis. The diagnostic evaluation should also identify the cause of dehydration. TREATMENT  Treatment of mild or moderate dehydration can often be done at home by increasing the amount of fluids that you drink. It is best to drink small amounts of fluid more often. Drinking too much at one time can make vomiting worse. Refer to the home care instructions below. Severe dehydration needs to be treated at the hospital where you will probably be given intravenous (IV) fluids that contain water and electrolytes. HOME CARE INSTRUCTIONS   Ask your caregiver about specific rehydration instructions.  Drink enough fluids to keep your urine clear or pale yellow.  Drink small amounts frequently if you have nausea and vomiting.  Eat as you normally do.  Avoid:  Foods or drinks high in sugar.  Carbonated  drinks.  Juice.  Extremely hot or cold fluids.  Drinks with caffeine.  Fatty, greasy foods.  Alcohol.  Tobacco.  Overeating.  Gelatin desserts.  Wash your hands well to avoid spreading bacteria and viruses.  Only take over-the-counter or prescription medicines for pain, discomfort, or fever as directed by your caregiver.  Ask your caregiver if you should continue all prescribed and over-the-counter medicines.  Keep all follow-up appointments with your caregiver. SEEK MEDICAL CARE IF:  You have abdominal pain and it increases or stays in one area (localizes).  You have a rash, stiff neck, or severe headache.  You are irritable, sleepy, or difficult to awaken.  You are weak, dizzy, or extremely thirsty. SEEK IMMEDIATE MEDICAL CARE IF:   You are unable to keep fluids down or you get worse despite treatment.  You have frequent episodes of vomiting or diarrhea.  You have blood or green matter (bile) in your vomit.  You have blood in your stool or your stool looks black and tarry.  You have not urinated in 6 to 8 hours, or you have only urinated a small amount of very dark urine.  You have a fever.  You faint. MAKE SURE YOU:   Understand these instructions.  Will watch your condition.  Will get help right away if you are not doing well or get worse. Document Released: 02/15/2005 Document Revised: 05/10/2011 Document Reviewed: 10/05/2010 ExitCare Patient Information 2014 [employer].  

## 2012-11-06 ENCOUNTER — Telehealth: Payer: Self-pay | Admitting: Dietician

## 2012-11-06 NOTE — Telephone Encounter (Signed)
Brief Outpatient Oncology Nutrition Note  Patient has been identified to be at risk on malnutrition screen.  Wt Readings from Last 10 Encounters:  11/02/12 171 lb 6.4 oz (77.747 kg)  10/26/12 175 lb 4.8 oz (79.516 kg)  10/03/12 178 lb 4.8 oz (80.876 kg)  09/15/12 181 lb 9.6 oz (82.373 kg)  08/15/12 192 lb 1.6 oz (87.136 kg)  08/11/12 187 lb (84.823 kg)  08/03/12 191 lb 9.3 oz (86.9 kg)  07/11/12 186 lb 4.8 oz (84.505 kg)  06/14/12 197 lb 3.2 oz (89.449 kg)  05/25/12 194 lb (87.998 kg)    Patient with hx of cervical cancer.  Patient with recent MD office visit with dehydration.  Called patient who reports that she is taking in more fluids and is eating better.  States that she does not like the nutritional supplements but has been trying to drink these.  Likes [employer].  Will mail patient tips to increase calorie and protein as well as coupon for [employer] and [facility] [address] contact information.  Encouraged patient to call if she has any questions.

## 2012-11-09 ENCOUNTER — Ambulatory Visit: Payer: 59

## 2012-11-11 NOTE — Progress Notes (Signed)
OFFICE PROGRESS NOTE  CC**  [name] Paci, MD [address] [facility] [address] [place] [address] Dr. Loree Fee  DIAGNOSIS: 63 year old female with metastatic cervical carcinoma  PRIOR THERAPY:  #1 patient underwent a radical hysterectomy bilateral salpingo-oophorectomy and pelvic lymphadenectomy in March 2005 for his stage IB cervical cancer. Her final pathology showed no residual disease in all lymph nodes were negative.  #2 patient was followed for 5 years with no evidence of recurrent disease until she developed a recurrence of the right pelvic sidewall in [place] 2010. This caused obstruction of the right ureter. She was treated with radiation therapy and concurrent cis-platinum chemotherapy.  #3 in late 2011 patient was found to have recurrence of the right inguinal lymph node and left pelvic nodes and received additional radiation therapy to those areas. His radiation was completed in January 2012. In April 2012 the patient had resolution of the right inguinal nodes. In August 2012 the patient was found to have a liver lesion which sure and 2.8 cm and received 6 cycles of Taxol. Patient had partial response to chemotherapy but the liver lesion began to increase to a size of 1.9. Reimaging showed that there were no other lesions except the liver and patient elected to a plate the liver lesion using RFA he performed a day 2013. The procedure went well but patient developed an extensive DVT in the left lower extremity and was placed on Lovenox and IVC filter  #4 patient subsequent treatments include of radiation therapy in June and August and September 2013. November 2013 she was found to have metastasis and small left inguinal lymph nodes. She resumed chemotherapy using low dose carboplatinum and Taxol between December and January 2014. This was complicated by neutropenia. Patient had CT scans in [place] 2004 that showed increase in size of the liver metastasis. She received  alimta.in May 2014.  #5 patient is here for cycle 2 day 1 of Alimta to be given every 21 days.  CURRENT THERAPY: alimta q 21 days  INTERVAL HISTORY: [name] 62 y.o. female returns for initial visit with me.she is feeling anxious today to start her chemotherapy. She denies any fevers chills night sweats headaches no shortness of breath no chest pains palpitations. She still does have tingling and numbness in her hands and feet it is bearable. She has not had any recent hospitalizations since her last visit. She denies any myalgias and arthralgias. She does not have any abdominal pain. She has no hematuria hematochezia melena hemoptysis or hematemesis. She does complain of some weakness which is generalized. [name] is negative.  MEDICAL HISTORY: Past Medical History  Diagnosis Date  . Diastolic dysfunction   . MIGRAINE HEADACHE   . Atrial fibrillation     failed DCCV, CHADs2-prev pradaxa stopped due to hematuria with ureter issues/JJ   . Anemia   . HTN (hypertension)   . GLAUCOMA   . CARPAL TUNNEL SYNDROME, RIGHT   . Lymphedema     L>R leg from pelvic XRT/scarring  . Hepatic steatosis     CT scan 06/2009, 12/2009  . DVT (deep venous thrombosis) 10/2010    LLE, anticoag resumed  . CHF (congestive heart failure)     1 yr ago per pt  . Arthritis   . GERD (gastroesophageal reflux disease)   . Small kidney     left  . Radiation 10/11/11-11/19/11    5040 cGy in 28 fx's/periaortic  .  Radiation 03/16/10-03/25/10    right inguinal node/left external iliac node  . Radiation 06/11/08-07/23/08    6000 cGy left pelvis  . Hyperlipidemia   . Leg swelling     left  . Cervical cancer dx 04/2003, recur 04/2008    s/p debulk (TAHBSO), chemo/XRT; recurrent dz L pelvis dx 2010 and liver met 09/2010 s/p RFA  . Osteoporosis with fracture 2014    compression fx, s/p KP ; started prolia 06/2012- narcotic pain mgmt    ALLERGIES:  is allergic to codeine; flecainide  acetate; morphine; and propafenone hcl.  MEDICATIONS:  Current Outpatient Prescriptions  Medication Sig Dispense Refill  . bisacodyl (DULCOLAX) 5 MG EC tablet Take 5 mg by mouth daily as needed for constipation.      . calcium-vitamin D (OSCAL WITH D) 500-200 MG-UNIT per tablet Take 1 tablet by mouth every morning.      . cetirizine (ZYRTEC) 10 MG tablet Take 10 mg by mouth every evening.       . ciprofloxacin (CIPRO) 500 MG tablet Take 1 tablet (500 mg total) by mouth 2 (two) times daily. For 7 days.  14 tablet  0  . citalopram (CELEXA) 20 MG tablet       . fentaNYL (DURAGESIC - DOSED MCG/HR) 100 MCG/HR Place 1 patch (100 mcg total) onto the skin every 3 (three) days. Total dose 125mg   10 patch  0  . fentaNYL (DURAGESIC - DOSED MCG/HR) 25 MCG/HR patch Place 1 patch (25 mcg total) onto the skin every 3 (three) days. Total dose of 125 mcg  10 patch  0  . ferrous fumarate (HEMOCYTE - 106 MG FE) 325 (106 FE) MG TABS Take 1 tablet (106 mg of iron total) by mouth daily.  30 each  0  . folic acid (FOLVITE) 1 MG tablet Take 1 mg by mouth every morning.      [name] HYDROmorphone (DILAUDID) 4 MG tablet Take 1 tablet every 4 hours as needed for pain  120 tablet  0  . ibuprofen (ADVIL,MOTRIN) 200 MG tablet Take 600 mg by mouth 3 (three) times daily as needed for pain. Pain      . lidocaine-prilocaine (EMLA) cream Apply 1 application topically daily as needed (for port access).      . LORazepam (ATIVAN) 1 MG tablet Take 1 mg by mouth every 6 (six) hours as needed (for nausea (may put under tongue)).       . megestrol (MEGACE) 40 MG/ML suspension       . metoprolol (LOPRESSOR) 50 MG tablet Take 1 tablet (50 mg total) by mouth 2 (two) times daily.  60 tablet  6  . omeprazole (PRILOSEC) 20 MG capsule Take 20 mg by mouth every morning.       . ondansetron (ZOFRAN) 8 MG tablet Take by mouth every 8 (eight) hours as needed for nausea.      . polyethylene glycol (MIRALAX / GLYCOLAX) packet Take 17 g by mouth 2 (two)  times daily as needed (constipation).       . protein supplement (UNJURY CHICKEN SOUP) POWD Take 7 g (2 oz total) by mouth 4 (four) times daily.  210 g  0  . Rivaroxaban (XARELTO) 20 MG TABS Take 1 tablet (20 mg total) by mouth every evening.  30 tablet  5  . sodium chloride (OCEAN) 0.65 % nasal spray Place 1 spray into the nose daily as needed for congestion. Allergies      . triamcinolone (NASACORT) 55 MCG/ACT nasal  inhaler Place 2 sprays into the nose daily as needed (allergies or congestion).       [name] zolpidem (AMBIEN) 5 MG tablet Take 1 tablet (5 mg total) by mouth at bedtime as needed for sleep.  20 tablet  0   No current facility-administered medications for this visit.    SURGICAL HISTORY:  Past Surgical History  Procedure Laterality Date  . Ureteral stent placement  2005, 01/2010, 07/2010    L hydro related to cervical ca and XRT  . Oophorectomy  2005  . Cardioversion    . Tonsillectomy    . Cholecystectomy  1984  . Total abdominal hysterectomy  2005  . Carpal tunnel  2009    right  . Ivc filter  04/2011    REVIEW OF SYSTEMS:  Pertinent items are noted in HPI.   HEALTH MAINTENANCE:  PHYSICAL EXAMINATION: Blood pressure 97/64, pulse 76, temperature 98 F (36.7 C), temperature source Oral, resp. rate 20, height 5' 1.5" (1.562 m), weight 175 lb 4.8 oz (79.516 kg). Body mass index is 32.59 kg/(m^2). ECOG PERFORMANCE STATUS: 0 - Asymptomatic   General appearance: alert, cooperative and appears stated age Resp: clear to auscultation bilaterally Cardio: regular rate and rhythm GI: soft, non-tender; bowel sounds normal; no masses,  no organomegaly Extremities: extremities normal, atraumatic, no cyanosis or edema Neurologic: Grossly normal   LABORATORY DATA: Lab Results  Component Value Date   WBC 5.2 11/02/2012   HGB 10.5* 11/02/2012   HCT 31.1* 11/02/2012   MCV 83.2 11/02/2012   PLT 140* 11/02/2012      Chemistry      Component Value Date/Time   NA 134* 11/02/2012 1216   NA  137 08/04/2012 0452   K 3.9 11/02/2012 1216   K 3.7 08/04/2012 0452   CL 108* 08/15/2012 1252   CL 105 08/04/2012 0452   CO2 25 11/02/2012 1216   CO2 25 08/04/2012 0452   BUN 10.5 11/02/2012 1216   BUN 4* 08/04/2012 0452   CREATININE 0.8 11/02/2012 1216   CREATININE 0.60 08/04/2012 0452   CREATININE 1.45* 09/11/2010 1603      Component Value Date/Time   CALCIUM 9.3 11/02/2012 1216   CALCIUM 7.5* 08/04/2012 0452   ALKPHOS 353* 11/02/2012 1216   ALKPHOS 383* 08/01/2012 0350   AST 33 11/02/2012 1216   AST 20 08/01/2012 0350   ALT 43 11/02/2012 1216   ALT 50* 08/01/2012 0350   BILITOT 0.73 11/02/2012 1216   BILITOT 0.4 08/01/2012 0350       RADIOGRAPHIC STUDIES:  Ct Chest W Contrast  10/13/2012   *RADIOLOGY REPORT*  Clinical Data:  Cervical cancer with metastasis.  Evaluate progression.  Restaging  CT CHEST, ABDOMEN AND PELVIS WITH CONTRAST  Technique:  Multidetector CT imaging of the chest, abdomen and pelvis was performed following the standard protocol during bolus administration of intravenous contrast.  Contrast: OMNIPAQUE IOHEXOL 300 MG/ML  SOLN  Comparison:  CT 07/29/2012  CT CHEST  Findings:  No axillary or supraclavicular lymphadenopathy.  There is a port in the right anterior chest wall.  No mediastinal hilar lymphadenopathy.  No pericardial fluid.  Review of the lung parenchyma demonstrates no new or suspicious pulmonary nodules.  IMPRESSION: No evidence of thoracic metastasis.  CT ABDOMEN AND PELVIS  Findings:  The right hepatic lobe with metastasis are stable to slightly increased in size compared to prior.  One lesion which is increased is as along the falciform ligament in the right hepatic lobe measuring 3.8 x  6.1 cm compared to 3.6 x 4.7 cm on prior. Lesion in the posterior right hepatic lobe measures 3.6 x 3.9 cm unchanged from the 3.8 x 3.9 cm.  Lesions superior right hepatic lobe measures 3.9 x 4.3 cm compared to 4.2 x 4.4 cm on prior. There is some mild peripheral ductal dilatation  within the right  hepatic lobe which appears increased compared to prior.  Mild duct dilatation of the left is similar.  Patient status post partial hepatectomy.  No significant periportal adenopathy.  7 mm periportal lymph node is unchanged (image 57).  The pancreas, spleen, adrenal glands, and right kidney normal.  The left kidney is atrophic.  There is an IVC filter inferior to the right renal vein.  No retroperitoneal or retrocrural adenopathy.  The stomach in a review of small bowel and colon unremarkable.  Post hysterectomy anatomy.  The bladder is collapsed.  No pelvic lymphadenopathy.  There is a low density collection along the left external iliac vessels measuring 31 x 21 mm which is unchanged in volume compared to prior.  This has simple fluid attenuation centrally likely represents a postsurgical fluid collection.  No evidence of pelvic lymphadenopathy. Review of  bone windows demonstrates no aggressive osseous lesions.  IMPRESSION:  1.  Essentially stable hepatic metastasis primarily in the right hepatic lobe.  One lesion measures larger. 2.  Essentially stable intrahepatic biliary duct dilatation. 3.  Atrophic left kidney. 4.  Hysterectomy with no evidence of local recurrence.   Original Report Authenticated By: Genevive Bi, M.D.   Ct Abdomen Pelvis W Contrast  10/13/2012   *RADIOLOGY REPORT*  Clinical Data:  Cervical cancer with metastasis.  Evaluate progression.  Restaging  CT CHEST, ABDOMEN AND PELVIS WITH CONTRAST  Technique:  Multidetector CT imaging of the chest, abdomen and pelvis was performed following the standard protocol during bolus administration of intravenous contrast.  Contrast: OMNIPAQUE IOHEXOL 300 MG/ML  SOLN  Comparison:  CT 07/29/2012  CT CHEST  Findings:  No axillary or supraclavicular lymphadenopathy.  There is a port in the right anterior chest wall.  No mediastinal hilar lymphadenopathy.  No pericardial fluid.  Review of the lung parenchyma demonstrates no new or suspicious  pulmonary nodules.  IMPRESSION: No evidence of thoracic metastasis.  CT ABDOMEN AND PELVIS  Findings:  The right hepatic lobe with metastasis are stable to slightly increased in size compared to prior.  One lesion which is increased is as along the falciform ligament in the right hepatic lobe measuring 3.8 x 6.1 cm compared to 3.6 x 4.7 cm on prior. Lesion in the posterior right hepatic lobe measures 3.6 x 3.9 cm unchanged from the 3.8 x 3.9 cm.  Lesions superior right hepatic lobe measures 3.9 x 4.3 cm compared to 4.2 x 4.4 cm on prior. There is some mild peripheral ductal dilatation  within the right hepatic lobe which appears increased compared to prior.  Mild duct dilatation of the left is similar.  Patient status post partial hepatectomy.  No significant periportal adenopathy.  7 mm periportal lymph node is unchanged (image 57).  The pancreas, spleen, adrenal glands, and right kidney normal.  The left kidney is atrophic.  There is an IVC filter inferior to the right renal vein.  No retroperitoneal or retrocrural adenopathy.  The stomach in a review of small bowel and colon unremarkable.  Post hysterectomy anatomy.  The bladder is collapsed.  No pelvic lymphadenopathy.  There is a low density collection along the left external iliac vessels  measuring 31 x 21 mm which is unchanged in volume compared to prior.  This has simple fluid attenuation centrally likely represents a postsurgical fluid collection.  No evidence of pelvic lymphadenopathy. Review of  bone windows demonstrates no aggressive osseous lesions.  IMPRESSION:  1.  Essentially stable hepatic metastasis primarily in the right hepatic lobe.  One lesion measures larger. 2.  Essentially stable intrahepatic biliary duct dilatation. 3.  Atrophic left kidney. 4.  Hysterectomy with no evidence of local recurrence.   Original Report Authenticated By: Genevive Bi, M.D.    ASSESSMENT: 62 year old female with  #1 metastatic cervical carcinoma with liver  metastasis. Patient has been treated with multiple therapeutic agents.  #2 patient's last treatment was with Alimta to be given every 21 days. She received cycle 1 of 07/21/2012. Thereafter due to complication was discontinued. She now with progressive disease there for she will resume cycle 2 on 10/26/2012. We have discussed using Neulasta on day 2 of her treatment to make sure that she does not develop febrile neutropenia as she did in the past. She is quite scared about this.  #3 depression and anxiety she is on Celexa.  #4 poor appetite: on megace  #5 DVT: Patient is on xeralto   PLAN:  #1 patient will proceed with pemetrexed day 1, cycle 2. Risks and benefits of this were discussed with the patient.  #2 we recommended that she start Neulasta on day 2 of her chemotherapy to help prevent neutropenia as she has experienced this in the past.  #3 patient will continue her antiemetics as prescribed including ondansetron and prochlorperazine  #4 she'll be seen back in one week's time for followup and lab.  All questions were answered. The patient knows to call the clinic with any problems, questions or concerns. We can certainly see the patient much sooner if necessary.  I spent 25 minutes counseling the patient face to face. The total time spent in the appointment was 30 minutes.    Drue Second, MD Medical/Oncology [facility] [phone] (beeper) [phone] (Office)

## 2012-11-16 ENCOUNTER — Other Ambulatory Visit: Payer: 59 | Admitting: Lab

## 2012-11-16 ENCOUNTER — Telehealth: Payer: Self-pay | Admitting: *Deleted

## 2012-11-16 ENCOUNTER — Other Ambulatory Visit (HOSPITAL_BASED_OUTPATIENT_CLINIC_OR_DEPARTMENT_OTHER): Payer: 59 | Admitting: Lab

## 2012-11-16 ENCOUNTER — Encounter: Payer: Self-pay | Admitting: Oncology

## 2012-11-16 ENCOUNTER — Ambulatory Visit (HOSPITAL_BASED_OUTPATIENT_CLINIC_OR_DEPARTMENT_OTHER): Payer: 59

## 2012-11-16 ENCOUNTER — Other Ambulatory Visit: Payer: Self-pay | Admitting: Emergency Medicine

## 2012-11-16 ENCOUNTER — Ambulatory Visit (HOSPITAL_BASED_OUTPATIENT_CLINIC_OR_DEPARTMENT_OTHER): Payer: 59 | Admitting: Oncology

## 2012-11-16 VITALS — BP 93/64 | HR 103 | Temp 97.6°F | Resp 20 | Ht 61.5 in | Wt 175.2 lb

## 2012-11-16 DIAGNOSIS — C539 Malignant neoplasm of cervix uteri, unspecified: Secondary | ICD-10-CM

## 2012-11-16 DIAGNOSIS — C787 Secondary malignant neoplasm of liver and intrahepatic bile duct: Secondary | ICD-10-CM

## 2012-11-16 DIAGNOSIS — C770 Secondary and unspecified malignant neoplasm of lymph nodes of head, face and neck: Secondary | ICD-10-CM

## 2012-11-16 DIAGNOSIS — I82409 Acute embolism and thrombosis of unspecified deep veins of unspecified lower extremity: Secondary | ICD-10-CM

## 2012-11-16 DIAGNOSIS — F341 Dysthymic disorder: Secondary | ICD-10-CM

## 2012-11-16 DIAGNOSIS — R630 Anorexia: Secondary | ICD-10-CM

## 2012-11-16 DIAGNOSIS — R112 Nausea with vomiting, unspecified: Secondary | ICD-10-CM

## 2012-11-16 DIAGNOSIS — Z5111 Encounter for antineoplastic chemotherapy: Secondary | ICD-10-CM

## 2012-11-16 DIAGNOSIS — E860 Dehydration: Secondary | ICD-10-CM

## 2012-11-16 LAB — COMPREHENSIVE METABOLIC PANEL (CC13)
AST: 15 U/L (ref 5–34)
Albumin: 3 g/dL — ABNORMAL LOW (ref 3.5–5.0)
BUN: 14.4 mg/dL (ref 7.0–26.0)
Calcium: 9.3 mg/dL (ref 8.4–10.4)
Chloride: 106 mEq/L (ref 98–109)
Glucose: 84 mg/dl (ref 70–140)
Potassium: 4.4 mEq/L (ref 3.5–5.1)

## 2012-11-16 LAB — CBC WITH DIFFERENTIAL/PLATELET
Basophils Absolute: 0.1 10*3/uL (ref 0.0–0.1)
EOS%: 1.8 % (ref 0.0–7.0)
Eosinophils Absolute: 0.1 10*3/uL (ref 0.0–0.5)
HGB: 10.4 g/dL — ABNORMAL LOW (ref 11.6–15.9)
MONO#: 0.7 10*3/uL (ref 0.1–0.9)
NEUT#: 3.3 10*3/uL (ref 1.5–6.5)
RDW: 16.2 % — ABNORMAL HIGH (ref 11.2–14.5)
WBC: 4.6 10*3/uL (ref 3.9–10.3)
lymph#: 0.6 10*3/uL — ABNORMAL LOW (ref 0.9–3.3)

## 2012-11-16 MED ORDER — ONDANSETRON 8 MG/NS 50 ML IVPB
INTRAVENOUS | Status: AC
  Filled 2012-11-16: qty 8

## 2012-11-16 MED ORDER — CYANOCOBALAMIN 1000 MCG/ML IJ SOLN
INTRAMUSCULAR | Status: AC
  Filled 2012-11-16: qty 1

## 2012-11-16 MED ORDER — SODIUM CHLORIDE 0.9 % IV SOLN
Freq: Once | INTRAVENOUS | Status: AC
  Administered 2012-11-16: 12:00:00 via INTRAVENOUS

## 2012-11-16 MED ORDER — CYANOCOBALAMIN 1000 MCG/ML IJ SOLN: 1000.0000 ug | Freq: Once | INTRAMUSCULAR | Status: DC

## 2012-11-16 MED ORDER — HEPARIN SOD (PORK) LOCK FLUSH 100 UNIT/ML IV SOLN
500.0000 [IU] | Freq: Once | INTRAVENOUS | Status: AC | PRN
  Administered 2012-11-16: 500 [IU]
  Filled 2012-11-16: qty 5

## 2012-11-16 MED ORDER — ONDANSETRON HCL 8 MG PO TABS: 8.0000 mg | ORAL_TABLET | Freq: Three times a day (TID) | ORAL | Status: AC | PRN

## 2012-11-16 MED ORDER — ONDANSETRON 8 MG/50ML IVPB (CHCC)
8.0000 mg | Freq: Once | INTRAVENOUS | Status: AC
  Administered 2012-11-16: 8 mg via INTRAVENOUS

## 2012-11-16 MED ORDER — LORAZEPAM 1 MG PO TABS: 1.0000 mg | ORAL_TABLET | Freq: Four times a day (QID) | ORAL | Status: DC | PRN

## 2012-11-16 MED ORDER — SODIUM CHLORIDE 0.9 % IV SOLN
500.0000 mg/m2 | Freq: Once | INTRAVENOUS | Status: AC
  Administered 2012-11-16: 950 mg via INTRAVENOUS
  Filled 2012-11-16: qty 38

## 2012-11-16 MED ORDER — DEXAMETHASONE SODIUM PHOSPHATE 10 MG/ML IJ SOLN
10.0000 mg | Freq: Once | INTRAMUSCULAR | Status: AC
  Administered 2012-11-16: 10 mg via INTRAVENOUS

## 2012-11-16 MED ORDER — SODIUM CHLORIDE 0.9 % IJ SOLN
10.0000 mL | INTRAMUSCULAR | Status: DC | PRN
  Administered 2012-11-16: 10 mL
  Filled 2012-11-16: qty 10

## 2012-11-16 MED ORDER — DEXAMETHASONE SODIUM PHOSPHATE 10 MG/ML IJ SOLN
INTRAMUSCULAR | Status: AC
  Filled 2012-11-16: qty 1

## 2012-11-16 NOTE — Patient Instructions (Addendum)
Proceed with alimata today  IVF today, friday and Saturday  We will see you back in 1 week for follow up

## 2012-11-16 NOTE — Telephone Encounter (Signed)
appts was made and printed. Pt is aware that tx will be added. i emailed MW to add the tx's...td

## 2012-11-16 NOTE — Patient Instructions (Signed)
[facility] Discharge Instructions for Patients Receiving Chemotherapy  Today you received the following chemotherapy agents Alimta To help prevent nausea and vomiting after your treatment, we encourage you to take your nausea medication as needed   If you develop nausea and vomiting that is not controlled by your nausea medication, call the clinic.   BELOW ARE SYMPTOMS THAT SHOULD BE REPORTED IMMEDIATELY:  *FEVER GREATER THAN 100.5 F  *CHILLS WITH OR WITHOUT FEVER  NAUSEA AND VOMITING THAT IS NOT CONTROLLED WITH YOUR NAUSEA MEDICATION  *UNUSUAL SHORTNESS OF BREATH  *UNUSUAL BRUISING OR BLEEDING  TENDERNESS IN MOUTH AND THROAT WITH OR WITHOUT PRESENCE OF ULCERS  *URINARY PROBLEMS  *BOWEL PROBLEMS  UNUSUAL RASH Items with * indicate a potential emergency and should be followed up as soon as possible.  Feel free to call the clinic you have any questions or concerns. The clinic phone number is [phone].    

## 2012-11-16 NOTE — Telephone Encounter (Signed)
Per staff message and POF I have scheduled appts.  JMW  

## 2012-11-17 ENCOUNTER — Ambulatory Visit: Payer: 59

## 2012-11-17 ENCOUNTER — Ambulatory Visit (HOSPITAL_BASED_OUTPATIENT_CLINIC_OR_DEPARTMENT_OTHER): Payer: 59

## 2012-11-17 ENCOUNTER — Telehealth: Payer: Self-pay | Admitting: Internal Medicine

## 2012-11-17 VITALS — BP 95/60 | HR 108 | Temp 97.9°F | Resp 18

## 2012-11-17 DIAGNOSIS — Z5189 Encounter for other specified aftercare: Secondary | ICD-10-CM

## 2012-11-17 DIAGNOSIS — E860 Dehydration: Secondary | ICD-10-CM

## 2012-11-17 DIAGNOSIS — R112 Nausea with vomiting, unspecified: Secondary | ICD-10-CM

## 2012-11-17 DIAGNOSIS — C539 Malignant neoplasm of cervix uteri, unspecified: Secondary | ICD-10-CM

## 2012-11-17 MED ORDER — FENTANYL 100 MCG/HR TD PT72: 1.0000 | MEDICATED_PATCH | TRANSDERMAL | Status: DC

## 2012-11-17 MED ORDER — SODIUM CHLORIDE 0.9 % IV SOLN
Freq: Once | INTRAVENOUS | Status: AC
  Administered 2012-11-17: 09:00:00 via INTRAVENOUS

## 2012-11-17 MED ORDER — PEGFILGRASTIM INJECTION 6 MG/0.6ML
6.0000 mg | Freq: Once | SUBCUTANEOUS | Status: AC
  Administered 2012-11-17: 6 mg via SUBCUTANEOUS
  Filled 2012-11-17: qty 0.6

## 2012-11-17 MED ORDER — ONDANSETRON 16 MG/50ML IVPB (CHCC)
INTRAVENOUS | Status: AC
  Filled 2012-11-17: qty 16

## 2012-11-17 MED ORDER — ONDANSETRON 16 MG/50ML IVPB (CHCC)
16.0000 mg | Freq: Once | INTRAVENOUS | Status: AC
  Administered 2012-11-17: 16 mg via INTRAVENOUS

## 2012-11-17 MED ORDER — FENTANYL 25 MCG/HR TD PT72: 1.0000 | MEDICATED_PATCH | TRANSDERMAL | Status: DC

## 2012-11-17 NOTE — Patient Instructions (Signed)
Dehydration, Adult Dehydration is when you lose more fluids from the body than you take in. Vital organs like the kidneys, brain, and heart cannot function without a proper amount of fluids and salt. Any loss of fluids from the body can cause dehydration.  CAUSES   Vomiting.  Diarrhea.  Excessive sweating.  Excessive urine output.  Fever. SYMPTOMS  Mild dehydration  Thirst.  Dry lips.  Slightly dry mouth. Moderate dehydration  Very dry mouth.  Sunken eyes.  Skin does not bounce back quickly when lightly pinched and released.  Dark urine and decreased urine production.  Decreased tear production.  Headache. Severe dehydration  Very dry mouth.  Extreme thirst.  Rapid, weak pulse (more than 100 beats per minute at rest).  Cold hands and feet.  Not able to sweat in spite of heat and temperature.  Rapid breathing.  Blue lips.  Confusion and lethargy.  Difficulty being awakened.  Minimal urine production.  No tears. DIAGNOSIS  Your caregiver will diagnose dehydration based on your symptoms and your exam. Blood and urine tests will help confirm the diagnosis. The diagnostic evaluation should also identify the cause of dehydration. TREATMENT  Treatment of mild or moderate dehydration can often be done at home by increasing the amount of fluids that you drink. It is best to drink small amounts of fluid more often. Drinking too much at one time can make vomiting worse. Refer to the home care instructions below. Severe dehydration needs to be treated at the hospital where you will probably be given intravenous (IV) fluids that contain water and electrolytes. HOME CARE INSTRUCTIONS   Ask your caregiver about specific rehydration instructions.  Drink enough fluids to keep your urine clear or pale yellow.  Drink small amounts frequently if you have nausea and vomiting.  Eat as you normally do.  Avoid:  Foods or drinks high in sugar.  Carbonated  drinks.  Juice.  Extremely hot or cold fluids.  Drinks with caffeine.  Fatty, greasy foods.  Alcohol.  Tobacco.  Overeating.  Gelatin desserts.  Wash your hands well to avoid spreading bacteria and viruses.  Only take over-the-counter or prescription medicines for pain, discomfort, or fever as directed by your caregiver.  Ask your caregiver if you should continue all prescribed and over-the-counter medicines.  Keep all follow-up appointments with your caregiver. SEEK MEDICAL CARE IF:  You have abdominal pain and it increases or stays in one area (localizes).  You have a rash, stiff neck, or severe headache.  You are irritable, sleepy, or difficult to awaken.  You are weak, dizzy, or extremely thirsty. SEEK IMMEDIATE MEDICAL CARE IF:   You are unable to keep fluids down or you get worse despite treatment.  You have frequent episodes of vomiting or diarrhea.  You have blood or green matter (bile) in your vomit.  You have blood in your stool or your stool looks black and tarry.  You have not urinated in 6 to 8 hours, or you have only urinated a small amount of very dark urine.  You have a fever.  You faint. MAKE SURE YOU:   Understand these instructions.  Will watch your condition.  Will get help right away if you are not doing well or get worse. Document Released: 02/15/2005 Document Revised: 05/10/2011 Document Reviewed: 10/05/2010 Exit[employer] Patient Information 2014 [employer]. Pegfilgrastim injection What is this medicine? PEGFILGRASTIM (peg fil GRA stim) helps the body make more white blood cells. It is used to prevent infection in people with   low amounts of white blood cells following cancer treatment. This medicine may be used for other purposes; ask your health care provider or pharmacist if you have questions. What should I tell my health care provider before I take this medicine? They need to know if you have any of these conditions: -sickle  cell disease -an unusual or allergic reaction to pegfilgrastim, filgrastim, E.coli protein, other medicines, foods, dyes, or preservatives -pregnant or trying to get pregnant -breast-feeding How should I use this medicine? This medicine is for injection under the skin. It is usually given by a health care professional in a hospital or clinic setting. If you get this medicine at home, you will be taught how to prepare and give this medicine. Do not shake this medicine. Use exactly as directed. Take your medicine at regular intervals. Do not take your medicine more often than directed. It is important that you put your used needles and syringes in a special sharps container. Do not put them in a trash can. If you do not have a sharps container, call your pharmacist or healthcare provider to get one. Talk to your pediatrician regarding the use of this medicine in children. While this drug may be prescribed for children who weigh more than 45 kg for selected conditions, precautions do apply Overdosage: If you think you have taken too much of this medicine contact a poison control center or emergency room at once. NOTE: This medicine is only for you. Do not share this medicine with others. What if I miss a dose? If you miss a dose, take it as soon as you can. If it is almost time for your next dose, take only that dose. Do not take double or extra doses. What may interact with this medicine? -lithium -medicines for growth therapy This list may not describe all possible interactions. Give your health care provider a list of all the medicines, herbs, non-prescription drugs, or dietary supplements you use. Also tell them if you smoke, drink alcohol, or use illegal drugs. Some items may interact with your medicine. What should I watch for while using this medicine? Visit your doctor for regular check ups. You will need important blood work done while you are taking this medicine. What side effects may I  notice from receiving this medicine? Side effects that you should report to your doctor or health care professional as soon as possible: -allergic reactions like skin rash, itching or hives, swelling of the face, lips, or tongue -breathing problems -fever -pain, redness, or swelling where injected -shoulder pain -stomach or side pain Side effects that usually do not require medical attention (report to your doctor or health care professional if they continue or are bothersome): -aches, pains -headache -loss of appetite -nausea, vomiting -unusually tired This list may not describe all possible side effects. Call your doctor for medical advice about side effects. You may report side effects to FDA at 1-800-FDA-1088. Where should I keep my medicine? Keep out of the reach of children. Store in a refrigerator between 2 and 8 degrees C (36 and 46 degrees F). Do not freeze. Keep in carton to protect from light. Throw away this medicine if it is left out of the refrigerator for more than 48 hours. Throw away any unused medicine after the expiration date. NOTE: This sheet is a summary. It may not cover all possible information. If you have questions about this medicine, talk to your doctor, pharmacist, or health care provider.  2013, Elsevier/Gold Standard. (09/18/2007 3:41:44   PM)  

## 2012-11-17 NOTE — Telephone Encounter (Signed)
Pt request refill for Fentanyl patches 100 mg and 25 mg. Please call pt if this is ok.

## 2012-11-17 NOTE — Telephone Encounter (Signed)
Ok. To refill.  

## 2012-11-17 NOTE — Progress Notes (Signed)
OFFICE PROGRESS NOTE  CC**  [name] Paci, MD [address] [facility] [address] [place] [address] Dr. Loree Fee  DIAGNOSIS: 62 year old female with metastatic cervical carcinoma  PRIOR THERAPY:  #1 patient underwent a radical hysterectomy bilateral salpingo-oophorectomy and pelvic lymphadenectomy in March 2005 for his stage IB cervical cancer. Her final pathology showed no residual disease in all lymph nodes were negative.  #2 patient was followed for 5 years with no evidence of recurrent disease until she developed a recurrence of the right pelvic sidewall in [place] 2010. This caused obstruction of the right ureter. She was treated with radiation therapy and concurrent cis-platinum chemotherapy.  #3 in late 2011 patient was found to have recurrence of the right inguinal lymph node and left pelvic nodes and received additional radiation therapy to those areas. His radiation was completed in January 2012. In April 2012 the patient had resolution of the right inguinal nodes. In August 2012 the patient was found to have a liver lesion which sure and 2.8 cm and received 6 cycles of Taxol. Patient had partial response to chemotherapy but the liver lesion began to increase to a size of 1.9. Reimaging showed that there were no other lesions except the liver and patient elected to a plate the liver lesion using RFA he performed a day 2013. The procedure went well but patient developed an extensive DVT in the left lower extremity and was placed on Lovenox and IVC filter  #4 patient subsequent treatments include of radiation therapy in June and August and September 2013. November 2013 she was found to have metastasis and small left inguinal lymph nodes. She resumed chemotherapy using low dose carboplatinum and Taxol between December and January 2014. This was complicated by neutropenia. Patient had CT scans in [place] 2004 that showed increase in size of the liver metastasis. She received  alimta.in May 2014.  #5 patient status post cycle 2 day 1 of Alimta to be given every 21 days.  CURRENT THERAPY: alimta q 21 days  INTERVAL HISTORY: [name] 62 y.o. female returns for followup after cycle 2 of chemotherapy. She is tired he. Her husband tells me that she has not been eating as well as she had been previously. She has not had any bleeding problems however no shortness of the chest pains. She does have some swelling in her lower extremities. She has not been doing much in terms of going out. She has had some abdominal pain off-and-on some diarrhea but and constipation. [name] is negative.  MEDICAL HISTORY: Past Medical History  Diagnosis Date  . Diastolic dysfunction   . MIGRAINE HEADACHE   . Atrial fibrillation     failed DCCV, CHADs2-prev pradaxa stopped due to hematuria with ureter issues/JJ   . Anemia   . HTN (hypertension)   . GLAUCOMA   . CARPAL TUNNEL SYNDROME, RIGHT   . Lymphedema     L>R leg from pelvic XRT/scarring  . Hepatic steatosis     CT scan 06/2009, 12/2009  . DVT (deep venous thrombosis) 10/2010    LLE, anticoag resumed  . CHF (congestive heart failure)     1 yr ago per pt  . Arthritis   . GERD (gastroesophageal reflux disease)   . Small kidney     left  . Radiation 10/11/11-11/19/11    5040 cGy in 28 fx's/periaortic  . Radiation 03/16/10-03/25/10    right inguinal node/left external iliac node  .  Radiation 06/11/08-07/23/08    6000 cGy left pelvis  . Hyperlipidemia   . Leg swelling     left  . Cervical cancer dx 04/2003, recur 04/2008    s/p debulk (TAHBSO), chemo/XRT; recurrent dz L pelvis dx 2010 and liver met 09/2010 s/p RFA  . Osteoporosis with fracture 2014    compression fx, s/p KP ; started prolia 06/2012- narcotic pain mgmt    ALLERGIES:  is allergic to codeine; flecainide acetate; morphine; and propafenone hcl.  MEDICATIONS:  Current Outpatient Prescriptions  Medication Sig Dispense Refill  .  citalopram (CELEXA) 20 MG tablet       . ferrous fumarate (HEMOCYTE - 106 MG FE) 325 (106 FE) MG TABS Take 1 tablet (106 mg of iron total) by mouth daily.  30 each  0  . folic acid (FOLVITE) 1 MG tablet Take 1 mg by mouth every morning.      [name] HYDROmorphone (DILAUDID) 4 MG tablet Take 1 tablet every 4 hours as needed for pain  120 tablet  0  . ibuprofen (ADVIL,MOTRIN) 200 MG tablet Take 600 mg by mouth 3 (three) times daily as needed for pain. Pain      . lidocaine-prilocaine (EMLA) cream Apply 1 application topically daily as needed (for port access).      . metoprolol (LOPRESSOR) 50 MG tablet Take 1 tablet (50 mg total) by mouth 2 (two) times daily.  60 tablet  6  . omeprazole (PRILOSEC) 20 MG capsule Take 20 mg by mouth every morning.       . polyethylene glycol (MIRALAX / GLYCOLAX) packet Take 17 g by mouth 2 (two) times daily as needed (constipation).       . protein supplement (UNJURY CHICKEN SOUP) POWD Take 7 g (2 oz total) by mouth 4 (four) times daily.  210 g  0  . Rivaroxaban (XARELTO) 20 MG TABS Take 1 tablet (20 mg total) by mouth every evening.  30 tablet  5  . sodium chloride (OCEAN) 0.65 % nasal spray Place 1 spray into the nose daily as needed for congestion. Allergies      . triamcinolone (NASACORT) 55 MCG/ACT nasal inhaler Place 2 sprays into the nose daily as needed (allergies or congestion).       [name] zolpidem (AMBIEN) 5 MG tablet Take 1 tablet (5 mg total) by mouth at bedtime as needed for sleep.  20 tablet  0  . bisacodyl (DULCOLAX) 5 MG EC tablet Take 5 mg by mouth daily as needed for constipation.      . calcium-vitamin D (OSCAL WITH D) 500-200 MG-UNIT per tablet Take 1 tablet by mouth every morning.      . cetirizine (ZYRTEC) 10 MG tablet Take 10 mg by mouth every evening.       . ciprofloxacin (CIPRO) 500 MG tablet Take 1 tablet (500 mg total) by mouth 2 (two) times daily. For 7 days.  14 tablet  0  . fentaNYL (DURAGESIC - DOSED MCG/HR) 100 MCG/HR Place 1 patch (100 mcg  total) onto the skin every 3 (three) days. Total dose 125mg   10 patch  0  . fentaNYL (DURAGESIC - DOSED MCG/HR) 25 MCG/HR patch Place 1 patch (25 mcg total) onto the skin every 3 (three) days. Total dose of 125 mcg  10 patch  0  . LORazepam (ATIVAN) 1 MG tablet Take 1 tablet (1 mg total) by mouth every 6 (six) hours as needed (for nausea (may put under tongue)).  30 tablet  2  .  megestrol (MEGACE) 40 MG/ML suspension       . ondansetron (ZOFRAN) 8 MG tablet Take 1 tablet (8 mg total) by mouth every 8 (eight) hours as needed for nausea.  30 tablet  2   No current facility-administered medications for this visit.    SURGICAL HISTORY:  Past Surgical History  Procedure Laterality Date  . Ureteral stent placement  2005, 01/2010, 07/2010    L hydro related to cervical ca and XRT  . Oophorectomy  2005  . Cardioversion    . Tonsillectomy    . Cholecystectomy  1984  . Total abdominal hysterectomy  2005  . Carpal tunnel  2009    right  . Ivc filter  04/2011    REVIEW OF SYSTEMS:  Pertinent items are noted in HPI.   HEALTH MAINTENANCE:  PHYSICAL EXAMINATION: Blood pressure 108/70, pulse 106, temperature 98.3 F (36.8 C), temperature source Oral, resp. rate 20, height 5' 1.5" (1.562 m), weight 171 lb 6.4 oz (77.747 kg). Body mass index is 31.87 kg/(m^2). ECOG PERFORMANCE STATUS: 0 - Asymptomatic   General appearance: alert, cooperative and appears stated age Resp: clear to auscultation bilaterally Cardio: regular rate and rhythm GI: soft, non-tender; bowel sounds normal; no masses,  no organomegaly Extremities: extremities normal, atraumatic, no cyanosis or edema Neurologic: Grossly normal   LABORATORY DATA: Lab Results  Component Value Date   WBC 4.6 11/16/2012   HGB 10.4* 11/16/2012   HCT 31.3* 11/16/2012   MCV 85.7 11/16/2012   PLT 322 11/16/2012      Chemistry      Component Value Date/Time   NA 140 11/16/2012 0930   NA 137 08/04/2012 0452   K 4.4 11/16/2012 0930   K 3.7  08/04/2012 0452   CL 108* 08/15/2012 1252   CL 105 08/04/2012 0452   CO2 28 11/16/2012 0930   CO2 25 08/04/2012 0452   BUN 14.4 11/16/2012 0930   BUN 4* 08/04/2012 0452   CREATININE 0.9 11/16/2012 0930   CREATININE 0.60 08/04/2012 0452   CREATININE 1.45* 09/11/2010 1603      Component Value Date/Time   CALCIUM 9.3 11/16/2012 0930   CALCIUM 7.5* 08/04/2012 0452   ALKPHOS 168* 11/16/2012 0930   ALKPHOS 383* 08/01/2012 0350   AST 15 11/16/2012 0930   AST 20 08/01/2012 0350   ALT 13 11/16/2012 0930   ALT 50* 08/01/2012 0350   BILITOT 0.28 11/16/2012 0930   BILITOT 0.4 08/01/2012 0350       RADIOGRAPHIC STUDIES:  Ct Chest W Contrast  10/13/2012   *RADIOLOGY REPORT*  Clinical Data:  Cervical cancer with metastasis.  Evaluate progression.  Restaging  CT CHEST, ABDOMEN AND PELVIS WITH CONTRAST  Technique:  Multidetector CT imaging of the chest, abdomen and pelvis was performed following the standard protocol during bolus administration of intravenous contrast.  Contrast: OMNIPAQUE IOHEXOL 300 MG/ML  SOLN  Comparison:  CT 07/29/2012  CT CHEST  Findings:  No axillary or supraclavicular lymphadenopathy.  There is a port in the right anterior chest wall.  No mediastinal hilar lymphadenopathy.  No pericardial fluid.  Review of the lung parenchyma demonstrates no new or suspicious pulmonary nodules.  IMPRESSION: No evidence of thoracic metastasis.  CT ABDOMEN AND PELVIS  Findings:  The right hepatic lobe with metastasis are stable to slightly increased in size compared to prior.  One lesion which is increased is as along the falciform ligament in the right hepatic lobe measuring 3.8 x 6.1 cm compared to 3.6 x  4.7 cm on prior. Lesion in the posterior right hepatic lobe measures 3.6 x 3.9 cm unchanged from the 3.8 x 3.9 cm.  Lesions superior right hepatic lobe measures 3.9 x 4.3 cm compared to 4.2 x 4.4 cm on prior. There is some mild peripheral ductal dilatation  within the right hepatic lobe which appears increased compared  to prior.  Mild duct dilatation of the left is similar.  Patient status post partial hepatectomy.  No significant periportal adenopathy.  7 mm periportal lymph node is unchanged (image 57).  The pancreas, spleen, adrenal glands, and right kidney normal.  The left kidney is atrophic.  There is an IVC filter inferior to the right renal vein.  No retroperitoneal or retrocrural adenopathy.  The stomach in a review of small bowel and colon unremarkable.  Post hysterectomy anatomy.  The bladder is collapsed.  No pelvic lymphadenopathy.  There is a low density collection along the left external iliac vessels measuring 31 x 21 mm which is unchanged in volume compared to prior.  This has simple fluid attenuation centrally likely represents a postsurgical fluid collection.  No evidence of pelvic lymphadenopathy. Review of  bone windows demonstrates no aggressive osseous lesions.  IMPRESSION:  1.  Essentially stable hepatic metastasis primarily in the right hepatic lobe.  One lesion measures larger. 2.  Essentially stable intrahepatic biliary duct dilatation. 3.  Atrophic left kidney. 4.  Hysterectomy with no evidence of local recurrence.   Original Report Authenticated By: Genevive Bi, M.D.   Ct Abdomen Pelvis W Contrast  10/13/2012   *RADIOLOGY REPORT*  Clinical Data:  Cervical cancer with metastasis.  Evaluate progression.  Restaging  CT CHEST, ABDOMEN AND PELVIS WITH CONTRAST  Technique:  Multidetector CT imaging of the chest, abdomen and pelvis was performed following the standard protocol during bolus administration of intravenous contrast.  Contrast: OMNIPAQUE IOHEXOL 300 MG/ML  SOLN  Comparison:  CT 07/29/2012  CT CHEST  Findings:  No axillary or supraclavicular lymphadenopathy.  There is a port in the right anterior chest wall.  No mediastinal hilar lymphadenopathy.  No pericardial fluid.  Review of the lung parenchyma demonstrates no new or suspicious pulmonary nodules.  IMPRESSION: No evidence of  thoracic metastasis.  CT ABDOMEN AND PELVIS  Findings:  The right hepatic lobe with metastasis are stable to slightly increased in size compared to prior.  One lesion which is increased is as along the falciform ligament in the right hepatic lobe measuring 3.8 x 6.1 cm compared to 3.6 x 4.7 cm on prior. Lesion in the posterior right hepatic lobe measures 3.6 x 3.9 cm unchanged from the 3.8 x 3.9 cm.  Lesions superior right hepatic lobe measures 3.9 x 4.3 cm compared to 4.2 x 4.4 cm on prior. There is some mild peripheral ductal dilatation  within the right hepatic lobe which appears increased compared to prior.  Mild duct dilatation of the left is similar.  Patient status post partial hepatectomy.  No significant periportal adenopathy.  7 mm periportal lymph node is unchanged (image 57).  The pancreas, spleen, adrenal glands, and right kidney normal.  The left kidney is atrophic.  There is an IVC filter inferior to the right renal vein.  No retroperitoneal or retrocrural adenopathy.  The stomach in a review of small bowel and colon unremarkable.  Post hysterectomy anatomy.  The bladder is collapsed.  No pelvic lymphadenopathy.  There is a low density collection along the left external iliac vessels measuring 31 x 21 mm which  is unchanged in volume compared to prior.  This has simple fluid attenuation centrally likely represents a postsurgical fluid collection.  No evidence of pelvic lymphadenopathy. Review of  bone windows demonstrates no aggressive osseous lesions.  IMPRESSION:  1.  Essentially stable hepatic metastasis primarily in the right hepatic lobe.  One lesion measures larger. 2.  Essentially stable intrahepatic biliary duct dilatation. 3.  Atrophic left kidney. 4.  Hysterectomy with no evidence of local recurrence.   Original Report Authenticated By: Genevive Bi, M.D.    ASSESSMENT: 62 year old female with  #1 metastatic cervical carcinoma with liver metastasis. Patient has been treated with  multiple therapeutic agents.  #2 patient's last treatment was with Alimta to be given every 21 days. She received cycle 1 of 07/21/2012. Thereafter due to complication was discontinued. She now with progressive disease there for she will resume cycle 2 on 10/26/2012. We have discussed using Neulasta on day 2 of her treatment to make sure that she does not develop febrile neutropenia as she did in the past. She is quite scared about this.  #3 depression and anxiety she is on Celexa.  #4 poor appetite: on megace  #5 DVT: Patient is on xeralto   PLAN:  #1Patient oh receive IV fluids today as well as antiemetics as she is dehydrated.  #2 she'll return in 2 weeks' time for followup and chemotherapy.  #3 patient will continue her antiemetics as prescribed including ondansetron and prochlorperazine   All questions were answered. The patient knows to call the clinic with any problems, questions or concerns. We can certainly see the patient much sooner if necessary.  I spent 25 minutes counseling the patient face to face. The total time spent in the appointment was 30 minutes.    Drue Second, MD Medical/Oncology [facility] [phone] (beeper) [phone] (Office)

## 2012-11-17 NOTE — Telephone Encounter (Signed)
Spoke with pt advised Rx ready for pick up 

## 2012-11-18 ENCOUNTER — Other Ambulatory Visit: Payer: Self-pay | Admitting: Medical Oncology

## 2012-11-18 ENCOUNTER — Ambulatory Visit (HOSPITAL_BASED_OUTPATIENT_CLINIC_OR_DEPARTMENT_OTHER): Payer: 59

## 2012-11-18 VITALS — BP 107/59 | HR 88 | Temp 96.8°F | Resp 18

## 2012-11-18 DIAGNOSIS — E860 Dehydration: Secondary | ICD-10-CM

## 2012-11-18 DIAGNOSIS — C539 Malignant neoplasm of cervix uteri, unspecified: Secondary | ICD-10-CM

## 2012-11-18 MED ORDER — SODIUM CHLORIDE 0.9 % IJ SOLN
10.0000 mL | INTRAMUSCULAR | Status: DC | PRN
  Administered 2012-11-18: 10 mL via INTRAVENOUS
  Filled 2012-11-18: qty 10

## 2012-11-18 MED ORDER — SODIUM CHLORIDE 0.9 % IV SOLN
INTRAVENOUS | Status: DC
  Administered 2012-11-18: 09:00:00 via INTRAVENOUS

## 2012-11-18 MED ORDER — HEPARIN SOD (PORK) LOCK FLUSH 100 UNIT/ML IV SOLN
500.0000 [IU] | Freq: Once | INTRAVENOUS | Status: AC
  Administered 2012-11-18: 500 [IU] via INTRAVENOUS
  Filled 2012-11-18: qty 5

## 2012-11-18 NOTE — Patient Instructions (Addendum)
Dehydration, Adult  Dehydration means your body does not have as much fluid as it needs. Your kidneys, brain, and heart will not work properly without the right amount of fluids and salt.   HOME CARE   Ask your doctor how to replace body fluid losses (rehydrate).   Drink enough fluids to keep your pee (urine) clear or pale yellow.   Drink small amounts of fluids often if you feel sick to your stomach (nauseous) or throw up (vomit).   Eat like you normally do.   Avoid:   Foods or drinks high in sugar.   Bubbly (carbonated) drinks.   Juice.   Very hot or cold fluids.   Drinks with caffeine.   Fatty, greasy foods.   Alcohol.   Tobacco.   Eating too much.   Gelatin desserts.   Wash your hands to avoid spreading germs (bacteria, viruses).   Only take medicine as told by your doctor.   Keep all doctor visits as told.  GET HELP RIGHT AWAY IF:    You cannot drink something without throwing up.   You get worse even with treatment.   Your vomit has blood in it or looks greenish.   Your poop (stool) has blood in it or looks black and tarry.   You have not peed in 6 to 8 hours.   You pee a small amount of very dark pee.   You have a fever.   You pass out (faint).   You have belly (abdominal) pain that gets worse or stays in one spot (localizes).   You have a rash, stiff neck, or bad headache.   You get easily annoyed, sleepy, or are hard to wake up.   You feel weak, dizzy, or very thirsty.  MAKE SURE YOU:    Understand these instructions.   Will watch your condition.   Will get help right away if you are not doing well or get worse.  Document Released: 12/12/2008 Document Revised: 05/10/2011 Document Reviewed: 10/05/2010  ExitCare Patient Information 2014 [employer].

## 2012-11-19 NOTE — Progress Notes (Signed)
OFFICE PROGRESS NOTE  CC**  [name] Paci, MD [address] [facility] [address] [place] [address] Dr. Loree Fee  DIAGNOSIS: 62 year old female with metastatic cervical carcinoma  PRIOR THERAPY:  #1 patient underwent a radical hysterectomy bilateral salpingo-oophorectomy and pelvic lymphadenectomy in March 2005 for his stage IB cervical cancer. Her final pathology showed no residual disease in all lymph nodes were negative.  #2 patient was followed for 5 years with no evidence of recurrent disease until she developed a recurrence of the right pelvic sidewall in [place] 2010. This caused obstruction of the right ureter. She was treated with radiation therapy and concurrent cis-platinum chemotherapy.  #3 in late 2011 patient was found to have recurrence of the right inguinal lymph node and left pelvic nodes and received additional radiation therapy to those areas. His radiation was completed in January 2012. In April 2012 the patient had resolution of the right inguinal nodes. In August 2012 the patient was found to have a liver lesion which sure and 2.8 cm and received 6 cycles of Taxol. Patient had partial response to chemotherapy but the liver lesion began to increase to a size of 1.9. Reimaging showed that there were no other lesions except the liver and patient elected to a plate the liver lesion using RFA he performed a day 2013. The procedure went well but patient developed an extensive DVT in the left lower extremity and was placed on Lovenox and IVC filter  #4 patient subsequent treatments include of radiation therapy in June and August and September 2013. November 2013 she was found to have metastasis and small left inguinal lymph nodes. She resumed chemotherapy using low dose carboplatinum and Taxol between December and January 2014. This was complicated by neutropenia. Patient had CT scans in [place] 2004 that showed increase in size of the liver metastasis. She received  alimta.in May 2014.  #5 patient status post cycle 2 day 1 of Alimta to be given every 21 days.  CURRENT THERAPY: cycle 3alimta q 21 days  INTERVAL HISTORY: [name] 62 y.o. female returns for followup for cycle 3 of chemotherapy. She has not had any bleeding problems however no shortness of the chest pains. She does have some swelling in her lower extremities. She has not been doing much in terms of going out. She has had some abdominal pain off-and-on some diarrhea but and constipation. [name] is negative.  MEDICAL HISTORY: Past Medical History  Diagnosis Date  . Diastolic dysfunction   . MIGRAINE HEADACHE   . Atrial fibrillation     failed DCCV, CHADs2-prev pradaxa stopped due to hematuria with ureter issues/JJ   . Anemia   . HTN (hypertension)   . GLAUCOMA   . CARPAL TUNNEL SYNDROME, RIGHT   . Lymphedema     L>R leg from pelvic XRT/scarring  . Hepatic steatosis     CT scan 06/2009, 12/2009  . DVT (deep venous thrombosis) 10/2010    LLE, anticoag resumed  . CHF (congestive heart failure)     1 yr ago per pt  . Arthritis   . GERD (gastroesophageal reflux disease)   . Small kidney     left  . Radiation 10/11/11-11/19/11    5040 cGy in 28 fx's/periaortic  . Radiation 03/16/10-03/25/10    right inguinal node/left external iliac node  . Radiation 06/11/08-07/23/08    6000 cGy left pelvis  . Hyperlipidemia   . Leg swelling  left  . Cervical cancer dx 04/2003, recur 04/2008    s/p debulk (TAHBSO), chemo/XRT; recurrent dz L pelvis dx 2010 and liver met 09/2010 s/p RFA  . Osteoporosis with fracture 2014    compression fx, s/p KP ; started prolia 06/2012- narcotic pain mgmt    ALLERGIES:  is allergic to codeine; flecainide acetate; morphine; and propafenone hcl.  MEDICATIONS:  Current Outpatient Prescriptions  Medication Sig Dispense Refill  . bisacodyl (DULCOLAX) 5 MG EC tablet Take 5 mg by mouth daily as needed for constipation.      .  cetirizine (ZYRTEC) 10 MG tablet Take 10 mg by mouth every evening.       . citalopram (CELEXA) 20 MG tablet       . ferrous fumarate (HEMOCYTE - 106 MG FE) 325 (106 FE) MG TABS Take 1 tablet (106 mg of iron total) by mouth daily.  30 each  0  . folic acid (FOLVITE) 1 MG tablet Take 1 mg by mouth every morning.      [name] HYDROmorphone (DILAUDID) 4 MG tablet Take 1 tablet every 4 hours as needed for pain  120 tablet  0  . ibuprofen (ADVIL,MOTRIN) 200 MG tablet Take 600 mg by mouth 3 (three) times daily as needed for pain. Pain      . lidocaine-prilocaine (EMLA) cream Apply 1 application topically daily as needed (for port access).      . metoprolol (LOPRESSOR) 50 MG tablet Take 1 tablet (50 mg total) by mouth 2 (two) times daily.  60 tablet  6  . omeprazole (PRILOSEC) 20 MG capsule Take 20 mg by mouth every morning.       . polyethylene glycol (MIRALAX / GLYCOLAX) packet Take 17 g by mouth 2 (two) times daily as needed (constipation).       . Rivaroxaban (XARELTO) 20 MG TABS Take 1 tablet (20 mg total) by mouth every evening.  30 tablet  5  . sodium chloride (OCEAN) 0.65 % nasal spray Place 1 spray into the nose daily as needed for congestion. Allergies      . triamcinolone (NASACORT) 55 MCG/ACT nasal inhaler Place 2 sprays into the nose daily as needed (allergies or congestion).       [name] zolpidem (AMBIEN) 5 MG tablet Take 1 tablet (5 mg total) by mouth at bedtime as needed for sleep.  20 tablet  0  . calcium-vitamin D (OSCAL WITH D) 500-200 MG-UNIT per tablet Take 1 tablet by mouth every morning.      . ciprofloxacin (CIPRO) 500 MG tablet Take 1 tablet (500 mg total) by mouth 2 (two) times daily. For 7 days.  14 tablet  0  . fentaNYL (DURAGESIC - DOSED MCG/HR) 100 MCG/HR Place 1 patch (100 mcg total) onto the skin every 3 (three) days. Total dose 125mg   10 patch  0  . fentaNYL (DURAGESIC - DOSED MCG/HR) 25 MCG/HR patch Place 1 patch (25 mcg total) onto the skin every 3 (three) days. Total dose of 125  mcg  10 patch  0  . LORazepam (ATIVAN) 1 MG tablet Take 1 tablet (1 mg total) by mouth every 6 (six) hours as needed (for nausea (may put under tongue)).  30 tablet  2  . megestrol (MEGACE) 40 MG/ML suspension       . ondansetron (ZOFRAN) 8 MG tablet Take 1 tablet (8 mg total) by mouth every 8 (eight) hours as needed for nausea.  30 tablet  2  . protein supplement (UNJURY CHICKEN  SOUP) POWD Take 7 g (2 oz total) by mouth 4 (four) times daily.  210 g  0   No current facility-administered medications for this visit.    SURGICAL HISTORY:  Past Surgical History  Procedure Laterality Date  . Ureteral stent placement  2005, 01/2010, 07/2010    L hydro related to cervical ca and XRT  . Oophorectomy  2005  . Cardioversion    . Tonsillectomy    . Cholecystectomy  1984  . Total abdominal hysterectomy  2005  . Carpal tunnel  2009    right  . Ivc filter  04/2011    REVIEW OF SYSTEMS:  Pertinent items are noted in HPI.   HEALTH MAINTENANCE:  PHYSICAL EXAMINATION: Blood pressure 93/64, pulse 103, temperature 97.6 F (36.4 C), temperature source Oral, resp. rate 20, height 5' 1.5" (1.562 m), weight 175 lb 3.2 oz (79.47 kg). Body mass index is 32.57 kg/(m^2). ECOG PERFORMANCE STATUS: 0 - Asymptomatic   General appearance: alert, cooperative and appears stated age Resp: clear to auscultation bilaterally Cardio: regular rate and rhythm GI: soft, non-tender; bowel sounds normal; no masses,  no organomegaly Extremities: extremities normal, atraumatic, no cyanosis or edema Neurologic: Grossly normal   LABORATORY DATA: Lab Results  Component Value Date   WBC 4.6 11/16/2012   HGB 10.4* 11/16/2012   HCT 31.3* 11/16/2012   MCV 85.7 11/16/2012   PLT 322 11/16/2012      Chemistry      Component Value Date/Time   NA 140 11/16/2012 0930   NA 137 08/04/2012 0452   K 4.4 11/16/2012 0930   K 3.7 08/04/2012 0452   CL 108* 08/15/2012 1252   CL 105 08/04/2012 0452   CO2 28 11/16/2012 0930   CO2 25 08/04/2012  0452   BUN 14.4 11/16/2012 0930   BUN 4* 08/04/2012 0452   CREATININE 0.9 11/16/2012 0930   CREATININE 0.60 08/04/2012 0452   CREATININE 1.45* 09/11/2010 1603      Component Value Date/Time   CALCIUM 9.3 11/16/2012 0930   CALCIUM 7.5* 08/04/2012 0452   ALKPHOS 168* 11/16/2012 0930   ALKPHOS 383* 08/01/2012 0350   AST 15 11/16/2012 0930   AST 20 08/01/2012 0350   ALT 13 11/16/2012 0930   ALT 50* 08/01/2012 0350   BILITOT 0.28 11/16/2012 0930   BILITOT 0.4 08/01/2012 0350       RADIOGRAPHIC STUDIES:  Ct Chest W Contrast  10/13/2012   *RADIOLOGY REPORT*  Clinical Data:  Cervical cancer with metastasis.  Evaluate progression.  Restaging  CT CHEST, ABDOMEN AND PELVIS WITH CONTRAST  Technique:  Multidetector CT imaging of the chest, abdomen and pelvis was performed following the standard protocol during bolus administration of intravenous contrast.  Contrast: OMNIPAQUE IOHEXOL 300 MG/ML  SOLN  Comparison:  CT 07/29/2012  CT CHEST  Findings:  No axillary or supraclavicular lymphadenopathy.  There is a port in the right anterior chest wall.  No mediastinal hilar lymphadenopathy.  No pericardial fluid.  Review of the lung parenchyma demonstrates no new or suspicious pulmonary nodules.  IMPRESSION: No evidence of thoracic metastasis.  CT ABDOMEN AND PELVIS  Findings:  The right hepatic lobe with metastasis are stable to slightly increased in size compared to prior.  One lesion which is increased is as along the falciform ligament in the right hepatic lobe measuring 3.8 x 6.1 cm compared to 3.6 x 4.7 cm on prior. Lesion in the posterior right hepatic lobe measures 3.6 x 3.9 cm unchanged from the  3.8 x 3.9 cm.  Lesions superior right hepatic lobe measures 3.9 x 4.3 cm compared to 4.2 x 4.4 cm on prior. There is some mild peripheral ductal dilatation  within the right hepatic lobe which appears increased compared to prior.  Mild duct dilatation of the left is similar.  Patient status post partial hepatectomy.  No  significant periportal adenopathy.  7 mm periportal lymph node is unchanged (image 57).  The pancreas, spleen, adrenal glands, and right kidney normal.  The left kidney is atrophic.  There is an IVC filter inferior to the right renal vein.  No retroperitoneal or retrocrural adenopathy.  The stomach in a review of small bowel and colon unremarkable.  Post hysterectomy anatomy.  The bladder is collapsed.  No pelvic lymphadenopathy.  There is a low density collection along the left external iliac vessels measuring 31 x 21 mm which is unchanged in volume compared to prior.  This has simple fluid attenuation centrally likely represents a postsurgical fluid collection.  No evidence of pelvic lymphadenopathy. Review of  bone windows demonstrates no aggressive osseous lesions.  IMPRESSION:  1.  Essentially stable hepatic metastasis primarily in the right hepatic lobe.  One lesion measures larger. 2.  Essentially stable intrahepatic biliary duct dilatation. 3.  Atrophic left kidney. 4.  Hysterectomy with no evidence of local recurrence.   Original Report Authenticated By: Genevive Bi, M.D.   Ct Abdomen Pelvis W Contrast  10/13/2012   *RADIOLOGY REPORT*  Clinical Data:  Cervical cancer with metastasis.  Evaluate progression.  Restaging  CT CHEST, ABDOMEN AND PELVIS WITH CONTRAST  Technique:  Multidetector CT imaging of the chest, abdomen and pelvis was performed following the standard protocol during bolus administration of intravenous contrast.  Contrast: OMNIPAQUE IOHEXOL 300 MG/ML  SOLN  Comparison:  CT 07/29/2012  CT CHEST  Findings:  No axillary or supraclavicular lymphadenopathy.  There is a port in the right anterior chest wall.  No mediastinal hilar lymphadenopathy.  No pericardial fluid.  Review of the lung parenchyma demonstrates no new or suspicious pulmonary nodules.  IMPRESSION: No evidence of thoracic metastasis.  CT ABDOMEN AND PELVIS  Findings:  The right hepatic lobe with metastasis are stable to  slightly increased in size compared to prior.  One lesion which is increased is as along the falciform ligament in the right hepatic lobe measuring 3.8 x 6.1 cm compared to 3.6 x 4.7 cm on prior. Lesion in the posterior right hepatic lobe measures 3.6 x 3.9 cm unchanged from the 3.8 x 3.9 cm.  Lesions superior right hepatic lobe measures 3.9 x 4.3 cm compared to 4.2 x 4.4 cm on prior. There is some mild peripheral ductal dilatation  within the right hepatic lobe which appears increased compared to prior.  Mild duct dilatation of the left is similar.  Patient status post partial hepatectomy.  No significant periportal adenopathy.  7 mm periportal lymph node is unchanged (image 57).  The pancreas, spleen, adrenal glands, and right kidney normal.  The left kidney is atrophic.  There is an IVC filter inferior to the right renal vein.  No retroperitoneal or retrocrural adenopathy.  The stomach in a review of small bowel and colon unremarkable.  Post hysterectomy anatomy.  The bladder is collapsed.  No pelvic lymphadenopathy.  There is a low density collection along the left external iliac vessels measuring 31 x 21 mm which is unchanged in volume compared to prior.  This has simple fluid attenuation centrally likely represents a postsurgical fluid  collection.  No evidence of pelvic lymphadenopathy. Review of  bone windows demonstrates no aggressive osseous lesions.  IMPRESSION:  1.  Essentially stable hepatic metastasis primarily in the right hepatic lobe.  One lesion measures larger. 2.  Essentially stable intrahepatic biliary duct dilatation. 3.  Atrophic left kidney. 4.  Hysterectomy with no evidence of local recurrence.   Original Report Authenticated By: Genevive Bi, M.D.    ASSESSMENT: 62 year old female with  #1 metastatic cervical carcinoma with liver metastasis. Patient has been treated with multiple therapeutic agents.  #2 patient's last treatment was with Alimta to be given every 21 days. She  received cycle 1 of 07/21/2012. Thereafter due to complication was discontinued. She now with progressive disease there for she will resume cycle 2 on 10/26/2012. We have discussed using Neulasta on day 2 of her treatment to make sure that she does not develop febrile neutropenia as she did in the past. She is quite scared about this.  #3 depression and anxiety she is on Celexa.  #4 poor appetite: on megace  #5 DVT: Patient is on xeralto   PLAN:  #1proceed with cycle #3 of pemetrexed today.  #2 she will return tomorrow for Neulasta injection.  #3 we will aggressively hydrate and manage patient's symptoms. She will get IV fluids today tomorrow and on Saturday.  #3 I will see her back in one week's time for followup. Patient is also encouraged to contact me sooner if she thinks she needs to be seen before the week this out.  All questions were answered. The patient knows to call the clinic with any problems, questions or concerns. We can certainly see the patient much sooner if necessary.  I spent 25 minutes counseling the patient face to face. The total time spent in the appointment was 30 minutes.    Drue Second, MD Medical/[facility] [phone] (beeper) [phone] (Office)

## 2012-11-23 ENCOUNTER — Encounter: Payer: Self-pay | Admitting: Oncology

## 2012-11-23 ENCOUNTER — Other Ambulatory Visit: Payer: Self-pay | Admitting: Medical Oncology

## 2012-11-23 ENCOUNTER — Inpatient Hospital Stay (HOSPITAL_COMMUNITY)
Admission: EM | Admit: 2012-11-23 | Discharge: 2012-11-28 | DRG: 392 | Disposition: A | Discharge status: Home / Self Care | Payer: 59 | Attending: Internal Medicine | Admitting: Internal Medicine

## 2012-11-23 ENCOUNTER — Ambulatory Visit: Payer: 59

## 2012-11-23 ENCOUNTER — Emergency Department (HOSPITAL_COMMUNITY): Payer: 59

## 2012-11-23 ENCOUNTER — Other Ambulatory Visit: Payer: Self-pay

## 2012-11-23 ENCOUNTER — Encounter (HOSPITAL_COMMUNITY): Payer: Self-pay | Admitting: Emergency Medicine

## 2012-11-23 ENCOUNTER — Ambulatory Visit (HOSPITAL_BASED_OUTPATIENT_CLINIC_OR_DEPARTMENT_OTHER): Payer: 59 | Admitting: Oncology

## 2012-11-23 ENCOUNTER — Telehealth: Payer: Self-pay | Admitting: *Deleted

## 2012-11-23 VITALS — BP 108/86 | HR 142 | Temp 98.0°F | Resp 20 | Ht 61.5 in | Wt 163.7 lb

## 2012-11-23 DIAGNOSIS — Z87891 Personal history of nicotine dependence: Secondary | ICD-10-CM

## 2012-11-23 DIAGNOSIS — F3289 Other specified depressive episodes: Secondary | ICD-10-CM | POA: Diagnosis present

## 2012-11-23 DIAGNOSIS — I82409 Acute embolism and thrombosis of unspecified deep veins of unspecified lower extremity: Secondary | ICD-10-CM | POA: Diagnosis present

## 2012-11-23 DIAGNOSIS — R1011 Right upper quadrant pain: Secondary | ICD-10-CM | POA: Diagnosis present

## 2012-11-23 DIAGNOSIS — C801 Malignant (primary) neoplasm, unspecified: Secondary | ICD-10-CM

## 2012-11-23 DIAGNOSIS — C799 Secondary malignant neoplasm of unspecified site: Secondary | ICD-10-CM

## 2012-11-23 DIAGNOSIS — K219 Gastro-esophageal reflux disease without esophagitis: Secondary | ICD-10-CM | POA: Diagnosis present

## 2012-11-23 DIAGNOSIS — R1110 Vomiting, unspecified: Secondary | ICD-10-CM

## 2012-11-23 DIAGNOSIS — T451X5A Adverse effect of antineoplastic and immunosuppressive drugs, initial encounter: Secondary | ICD-10-CM | POA: Diagnosis present

## 2012-11-23 DIAGNOSIS — R112 Nausea with vomiting, unspecified: Principal | ICD-10-CM | POA: Diagnosis present

## 2012-11-23 DIAGNOSIS — Z7901 Long term (current) use of anticoagulants: Secondary | ICD-10-CM

## 2012-11-23 DIAGNOSIS — I10 Essential (primary) hypertension: Secondary | ICD-10-CM | POA: Diagnosis present

## 2012-11-23 DIAGNOSIS — D638 Anemia in other chronic diseases classified elsewhere: Secondary | ICD-10-CM | POA: Diagnosis present

## 2012-11-23 DIAGNOSIS — Z923 Personal history of irradiation: Secondary | ICD-10-CM

## 2012-11-23 DIAGNOSIS — G8929 Other chronic pain: Secondary | ICD-10-CM | POA: Diagnosis present

## 2012-11-23 DIAGNOSIS — R61 Generalized hyperhidrosis: Secondary | ICD-10-CM

## 2012-11-23 DIAGNOSIS — I4891 Unspecified atrial fibrillation: Secondary | ICD-10-CM | POA: Diagnosis present

## 2012-11-23 DIAGNOSIS — E860 Dehydration: Secondary | ICD-10-CM

## 2012-11-23 DIAGNOSIS — C539 Malignant neoplasm of cervix uteri, unspecified: Secondary | ICD-10-CM | POA: Diagnosis present

## 2012-11-23 DIAGNOSIS — G43909 Migraine, unspecified, not intractable, without status migrainosus: Secondary | ICD-10-CM | POA: Diagnosis present

## 2012-11-23 DIAGNOSIS — D696 Thrombocytopenia, unspecified: Secondary | ICD-10-CM | POA: Diagnosis present

## 2012-11-23 DIAGNOSIS — I82509 Chronic embolism and thrombosis of unspecified deep veins of unspecified lower extremity: Secondary | ICD-10-CM | POA: Diagnosis present

## 2012-11-23 DIAGNOSIS — R197 Diarrhea, unspecified: Secondary | ICD-10-CM | POA: Diagnosis present

## 2012-11-23 DIAGNOSIS — C787 Secondary malignant neoplasm of liver and intrahepatic bile duct: Secondary | ICD-10-CM | POA: Diagnosis present

## 2012-11-23 DIAGNOSIS — D630 Anemia in neoplastic disease: Secondary | ICD-10-CM | POA: Diagnosis present

## 2012-11-23 DIAGNOSIS — Z90710 Acquired absence of both cervix and uterus: Secondary | ICD-10-CM

## 2012-11-23 DIAGNOSIS — R9431 Abnormal electrocardiogram [ECG] [EKG]: Secondary | ICD-10-CM

## 2012-11-23 DIAGNOSIS — F329 Major depressive disorder, single episode, unspecified: Secondary | ICD-10-CM | POA: Diagnosis present

## 2012-11-23 LAB — COMPREHENSIVE METABOLIC PANEL
CO2: 26 mEq/L (ref 19–32)
Calcium: 9.9 mg/dL (ref 8.4–10.5)
Chloride: 96 mEq/L (ref 96–112)
Creatinine, Ser: 0.77 mg/dL (ref 0.50–1.10)
GFR calc Af Amer: >90 mL/min (ref >90)
GFR calc non Af Amer: 89 mL/min — ABNORMAL LOW (ref >90)
Glucose, Bld: 127 mg/dL — ABNORMAL HIGH (ref 70–99)
Total Bilirubin: 0.5 mg/dL (ref 0.3–1.2)

## 2012-11-23 LAB — CBC WITH DIFFERENTIAL/PLATELET
Basophils Relative: 0 % (ref 0–1)
Eosinophils Absolute: 0 10*3/uL (ref 0.0–0.7)
Eosinophils Relative: 0 % (ref 0–5)
Hemoglobin: 10.7 g/dL — ABNORMAL LOW (ref 12.0–15.0)
Lymphocytes Relative: 6 % — ABNORMAL LOW (ref 12–46)
Lymphs Abs: 0.5 10*3/uL — ABNORMAL LOW (ref 0.7–4.0)
MCH: 28.1 pg (ref 26.0–34.0)
MCV: 82.9 fL (ref 78.0–100.0)
Monocytes Absolute: 0.7 10*3/uL (ref 0.1–1.0)
Neutrophils Relative %: 86 % — ABNORMAL HIGH (ref 43–77)
Platelets: 144 10*3/uL — ABNORMAL LOW (ref 150–400)
RBC: 3.81 MIL/uL — ABNORMAL LOW (ref 3.87–5.11)
RDW: 16.1 % — ABNORMAL HIGH (ref 11.5–15.5)

## 2012-11-23 LAB — URINE MICROSCOPIC-ADD ON

## 2012-11-23 LAB — URINALYSIS, ROUTINE W REFLEX MICROSCOPIC
Ketones, ur: 40 mg/dL — AB
Nitrite: NEGATIVE
Protein, ur: 100 mg/dL — AB
Specific Gravity, Urine: 1.029 (ref 1.005–1.030)
Urobilinogen, UA: 1 mg/dL (ref 0.0–1.0)

## 2012-11-23 LAB — TROPONIN I: Troponin I: <0.3 ng/mL (ref <0.30)

## 2012-11-23 MED ORDER — METOCLOPRAMIDE HCL 5 MG/ML IJ SOLN
10.0000 mg | Freq: Once | INTRAMUSCULAR | Status: AC
  Administered 2012-11-23: 10 mg via INTRAVENOUS
  Filled 2012-11-23: qty 2

## 2012-11-23 MED ORDER — LORAZEPAM 1 MG PO TABS
1.0000 mg | ORAL_TABLET | Freq: Four times a day (QID) | ORAL | Status: DC | PRN
  Administered 2012-11-23: 1 mg via ORAL
  Filled 2012-11-23: qty 1

## 2012-11-23 MED ORDER — METOPROLOL TARTRATE 1 MG/ML IV SOLN
5.0000 mg | Freq: Once | INTRAVENOUS | Status: AC
  Administered 2012-11-23: 5 mg via INTRAVENOUS
  Filled 2012-11-23: qty 5

## 2012-11-23 MED ORDER — ACETAMINOPHEN 325 MG PO TABS
650.0000 mg | ORAL_TABLET | Freq: Once | ORAL | Status: AC
  Administered 2012-11-23: 650 mg via ORAL
  Filled 2012-11-23: qty 2

## 2012-11-23 MED ORDER — IBUPROFEN 600 MG PO TABS
600.0000 mg | ORAL_TABLET | Freq: Three times a day (TID) | ORAL | Status: DC | PRN
  Filled 2012-11-23: qty 1

## 2012-11-23 MED ORDER — LORATADINE 10 MG PO TABS
10.0000 mg | ORAL_TABLET | Freq: Every day | ORAL | Status: DC
  Administered 2012-11-23 – 2012-11-28 (×6): 10 mg via ORAL
  Filled 2012-11-23 (×6): qty 1

## 2012-11-23 MED ORDER — SODIUM CHLORIDE 0.9 % IV BOLUS (SEPSIS)
1000.0000 mL | Freq: Once | INTRAVENOUS | Status: AC
  Administered 2012-11-23: 1000 mL via INTRAVENOUS

## 2012-11-23 MED ORDER — LEVALBUTEROL HCL 0.63 MG/3ML IN NEBU: 0.6300 mg | INHALATION_SOLUTION | RESPIRATORY_TRACT | Status: DC | PRN

## 2012-11-23 MED ORDER — FENTANYL 100 MCG/HR TD PT72
100.0000 ug | MEDICATED_PATCH | TRANSDERMAL | Status: DC
  Administered 2012-11-23 – 2012-11-26 (×2): 100 ug via TRANSDERMAL
  Filled 2012-11-23 (×2): qty 1

## 2012-11-23 MED ORDER — PROMETHAZINE HCL 25 MG/ML IJ SOLN
25.0000 mg | Freq: Once | INTRAMUSCULAR | Status: AC
  Administered 2012-11-23: 25 mg via INTRAVENOUS
  Filled 2012-11-23: qty 1

## 2012-11-23 MED ORDER — SODIUM CHLORIDE 0.9 % IV SOLN
INTRAVENOUS | Status: AC
  Administered 2012-11-23: 12:00:00 via INTRAVENOUS

## 2012-11-23 MED ORDER — SODIUM CHLORIDE 0.9 % IJ SOLN
3.0000 mL | Freq: Two times a day (BID) | INTRAMUSCULAR | Status: DC
  Administered 2012-11-24: 10 mL via INTRAVENOUS

## 2012-11-23 MED ORDER — FLUTICASONE PROPIONATE 50 MCG/ACT NA SUSP: 2.0000 | Freq: Every day | NASAL | Status: DC | PRN

## 2012-11-23 MED ORDER — FENTANYL 25 MCG/HR TD PT72
25.0000 ug | MEDICATED_PATCH | TRANSDERMAL | Status: DC
  Administered 2012-11-23 – 2012-11-26 (×2): 25 ug via TRANSDERMAL
  Filled 2012-11-23 (×2): qty 1

## 2012-11-23 MED ORDER — ONDANSETRON HCL 4 MG/2ML IJ SOLN
8.0000 mg | Freq: Three times a day (TID) | INTRAMUSCULAR | Status: DC
  Administered 2012-11-23 – 2012-11-28 (×14): 8 mg via INTRAVENOUS
  Filled 2012-11-23 (×13): qty 4

## 2012-11-23 MED ORDER — HYDROMORPHONE HCL PF 1 MG/ML IJ SOLN
1.0000 mg | INTRAMUSCULAR | Status: DC | PRN
  Administered 2012-11-25: 1 mg via INTRAVENOUS
  Filled 2012-11-23: qty 2

## 2012-11-23 MED ORDER — HYDROMORPHONE HCL 4 MG PO TABS
4.0000 mg | ORAL_TABLET | ORAL | Status: DC | PRN
  Administered 2012-11-24 – 2012-11-28 (×11): 4 mg via ORAL
  Filled 2012-11-23 (×11): qty 1

## 2012-11-23 MED ORDER — ZOLPIDEM TARTRATE 5 MG PO TABS
5.0000 mg | ORAL_TABLET | Freq: Every evening | ORAL | Status: DC | PRN
  Administered 2012-11-24: 5 mg via ORAL
  Filled 2012-11-23: qty 1

## 2012-11-23 MED ORDER — GI COCKTAIL ~~LOC~~
30.0000 mL | Freq: Three times a day (TID) | ORAL | Status: DC | PRN
  Filled 2012-11-23: qty 30

## 2012-11-23 MED ORDER — SODIUM CHLORIDE 0.9 % IV SOLN
INTRAVENOUS | Status: DC
  Administered 2012-11-23: 17:00:00 via INTRAVENOUS

## 2012-11-23 MED ORDER — ONDANSETRON 16 MG/50ML IVPB (CHCC)
16.0000 mg | Freq: Once | INTRAVENOUS | Status: AC
  Administered 2012-11-23: 16 mg via INTRAVENOUS

## 2012-11-23 MED ORDER — FERROUS FUMARATE 325 (106 FE) MG PO TABS
1.0000 | ORAL_TABLET | Freq: Every day | ORAL | Status: DC
  Administered 2012-11-24 – 2012-11-28 (×5): 106 mg via ORAL
  Filled 2012-11-23 (×6): qty 1

## 2012-11-23 MED ORDER — ACETAMINOPHEN 650 MG RE SUPP: 650.0000 mg | Freq: Four times a day (QID) | RECTAL | Status: DC | PRN

## 2012-11-23 MED ORDER — SODIUM CHLORIDE 0.9 % IV SOLN
INTRAVENOUS | Status: DC
  Administered 2012-11-24 – 2012-11-25 (×3): via INTRAVENOUS

## 2012-11-23 MED ORDER — LIDOCAINE-PRILOCAINE 2.5-2.5 % EX CREA: 1.0000 "application " | TOPICAL_CREAM | Freq: Every day | CUTANEOUS | Status: DC | PRN

## 2012-11-23 MED ORDER — RIVAROXABAN 20 MG PO TABS
20.0000 mg | ORAL_TABLET | Freq: Every day | ORAL | Status: DC
  Administered 2012-11-23 – 2012-11-27 (×5): 20 mg via ORAL
  Filled 2012-11-23 (×6): qty 1

## 2012-11-23 MED ORDER — CITALOPRAM HYDROBROMIDE 20 MG PO TABS
20.0000 mg | ORAL_TABLET | Freq: Every evening | ORAL | Status: DC
  Administered 2012-11-24 – 2012-11-27 (×4): 20 mg via ORAL
  Filled 2012-11-23 (×6): qty 1

## 2012-11-23 MED ORDER — ONDANSETRON 16 MG/50ML IVPB (CHCC)
INTRAVENOUS | Status: AC
  Filled 2012-11-23: qty 16

## 2012-11-23 MED ORDER — ACETAMINOPHEN 325 MG PO TABS: 650.0000 mg | ORAL_TABLET | Freq: Four times a day (QID) | ORAL | Status: DC | PRN

## 2012-11-23 MED ORDER — METOPROLOL TARTRATE 1 MG/ML IV SOLN
5.0000 mg | Freq: Three times a day (TID) | INTRAVENOUS | Status: DC
  Administered 2012-11-23 – 2012-11-24 (×2): 5 mg via INTRAVENOUS
  Filled 2012-11-23 (×5): qty 5

## 2012-11-23 NOTE — ED Notes (Signed)
Bed: MW41 Expected date:  Expected time:  Means of arrival:  Comments: Closed

## 2012-11-23 NOTE — ED Notes (Signed)
Bed: ZO10 Expected date:  Expected time:  Means of arrival:  Comments: Patient still in room.

## 2012-11-23 NOTE — ED Notes (Signed)
Pt returned from xray

## 2012-11-23 NOTE — H&P (Signed)
[facility] History and Physical  [name] ZOX:[id number] DOB: 07-29-1950 DOA: 11/23/2012  Referring physician: Dr. Rubin Payor PCP: [name], MD  Specialists: Hematology oncology: Dr. Welton Flakes cardiology: Dr. Johney Frame  Chief Complaint: Nausea/ vomiting/ diarrhea  HPI: [name] is a 62 y.o. female  With history of metastatic cervical carcinoma with metastases to the liver, prior history of radiation and concurrent chemotherapy, currently on chemotherapy with last therapy one week prior to admission, atrial fibrillation on chronic anticoagulation, hypertension, history of DVT, gastroesophageal reflux disease, who presents to the ED with a 2 to three-day history of nausea and intractable emesis, one-day history of diarrhea, one week history of generalized weakness and not feeling well. Patient does endorse some chills, low-grade fever, chronic right upper abdominal pain, some dysuria. Patient also complained of significant heartburn. Patient denied any chest pain, no shortness of breath, no cough, no hematemesis, no hematochezia, no melena, no constipation. Patient stated she received chemotherapy one week prior to admission and then one to 2 days after that was receiving IV fluids at a cancer center. Patient had presented to the cancer Center today EKG done there was A. fib with RVR she was subsequently sent to the ED. In the ED comprehensive metabolic profile obtained at our phosphatase of 224 and albumin of 3.2 otherwise was within normal limits. CBC had a hemoglobin of 10.7 and platelet count of 144. Acute abdominal series which was done was unremarkable. Patient was given some IV fluids, also given some Zofran and Phenergan and was still vomiting. Patient was subsequently given some Reglan. We were called to admit the patient.  Review of Systems: The patient denies anorexia, fever, weight loss,, vision loss, decreased hearing, hoarseness, chest pain, syncope, dyspnea on exertion,  peripheral edema, balance deficits, hemoptysis, abdominal pain, melena, hematochezia, severe indigestion/heartburn, hematuria, incontinence, genital sores, muscle weakness, suspicious skin lesions, transient blindness, difficulty walking, depression, unusual weight change, abnormal bleeding, enlarged lymph nodes, angioedema, and breast masses.   Past Medical History  Diagnosis Date  . Diastolic dysfunction   . MIGRAINE HEADACHE   . Atrial fibrillation     failed DCCV, CHADs2-prev pradaxa stopped due to hematuria with ureter issues/JJ   . Anemia   . HTN (hypertension)   . GLAUCOMA   . CARPAL TUNNEL SYNDROME, RIGHT   . Lymphedema     L>R leg from pelvic XRT/scarring  . Hepatic steatosis     CT scan 06/2009, 12/2009  . DVT (deep venous thrombosis) 10/2010    LLE, anticoag resumed  . CHF (congestive heart failure)     1 yr ago per pt  . Arthritis   . GERD (gastroesophageal reflux disease)   . Small kidney     left  . Radiation 10/11/11-11/19/11    5040 cGy in 28 fx's/periaortic  . Radiation 03/16/10-03/25/10    right inguinal node/left external iliac node  . Radiation 06/11/08-07/23/08    6000 cGy left pelvis  . Hyperlipidemia   . Leg swelling     left  . Cervical cancer dx 04/2003, recur 04/2008    s/p debulk (TAHBSO), chemo/XRT; recurrent dz L pelvis dx 2010 and liver met 09/2010 s/p RFA  . Osteoporosis with fracture 2014    compression fx, s/p KP ; started prolia 06/2012- narcotic pain mgmt   Past Surgical History  Procedure Laterality Date  . Ureteral stent placement  2005, 01/2010, 07/2010    L hydro related to cervical ca and XRT  . Oophorectomy  2005  . Cardioversion    .  Tonsillectomy    . Cholecystectomy  1984  . Total abdominal hysterectomy  2005  . Carpal tunnel  2009    right  . Ivc filter  04/2011   Social History:  reports that she quit smoking about 28 years ago. She has never used smokeless tobacco. She reports that she does not drink alcohol or use illicit  drugs.  Allergies  Allergen Reactions  . Codeine Rash and Other (See Comments)    Breathing issues  . Flecainide Acetate Other (See Comments)    Doesn't remember reaction  . Morphine Other (See Comments)     Hallucinations  . Propafenone Hcl Other (See Comments)    Leg cramps    Family History  Problem Relation Age of Onset  . Lung cancer    . Hypertension    . Heart disease    . Stroke    . Asthma    . Asthma Son   . Cancer Mother     lung  . Stroke Father      Prior to Admission medications   Medication Sig Start Date End Date Taking? Authorizing Provider  bisacodyl (DULCOLAX) 5 MG EC tablet Take 5 mg by mouth daily as needed for constipation.   Yes Historical Provider, MD  cetirizine (ZYRTEC) 10 MG tablet Take 10 mg by mouth every evening.    Yes Historical Provider, MD  citalopram (CELEXA) 20 MG tablet Take 20 mg by mouth every evening.  10/03/12  Yes Historical Provider, MD  fentaNYL (DURAGESIC - DOSED MCG/HR) 100 MCG/HR Place 1 patch (100 mcg total) onto the skin every 3 (three) days. Total dose 125mg  11/17/12  Yes Nicki Reaper, NP  fentaNYL (DURAGESIC - DOSED MCG/HR) 25 MCG/HR patch Place 1 patch (25 mcg total) onto the skin every 3 (three) days. Total dose of 125 mcg 11/17/12  Yes Nicki Reaper, NP  ferrous fumarate (HEMOCYTE - 106 MG FE) 325 (106 FE) MG TABS Take 1 tablet (106 mg of iron total) by mouth daily. 08/04/12  Yes Laveda Norman, MD  folic acid (FOLVITE) 1 MG tablet Take 1 mg by mouth every morning. 07/11/12  Yes Conni Slipper, PA-C  HYDROmorphone (DILAUDID) 4 MG tablet Take 1 tablet every 4 hours as needed for pain 10/17/12  Yes Augustin Schooling, NP  ibuprofen (ADVIL,MOTRIN) 200 MG tablet Take 600 mg by mouth 3 (three) times daily as needed for pain. Pain   Yes Historical Provider, MD  lidocaine-prilocaine (EMLA) cream Apply 1 application topically daily as needed (for port access).   Yes Historical Provider, MD  LORazepam (ATIVAN) 1 MG tablet Take 1 tablet (1 mg  total) by mouth every 6 (six) hours as needed (for nausea (may put under tongue)). 11/16/12  Yes Victorino December, MD  metoprolol (LOPRESSOR) 50 MG tablet Take 50 mg by mouth 2 (two) times daily as needed (bp under 160/90).   Yes Historical Provider, MD  Nutritional Supplements (CARNATION INSTANT BREAKFAST PO) Take 1 Can by mouth 4 (four) times daily as needed (for meals).   Yes Historical Provider, MD  omeprazole (PRILOSEC) 20 MG capsule Take 20 mg by mouth every morning.    Yes Historical Provider, MD  ondansetron (ZOFRAN) 8 MG tablet Take 1 tablet (8 mg total) by mouth every 8 (eight) hours as needed for nausea. 11/16/12  Yes Victorino December, MD  polyethylene glycol (MIRALAX / GLYCOLAX) packet Take 17 g by mouth 2 (two) times daily as needed (constipation).    Yes Historical Provider,  MD  PRESCRIPTION MEDICATION Inject into the vein every 21 ( twenty-one) days. PEMEtrexed (ALIMTA) 950 mg in sodium chloride 0.9 % 100 mL chemo infusion 500 mg/m2  1.91 m2 (Treatment Plan Actual) md Memory Argue Historical Provider, MD  protein supplement (UNJURY CHICKEN SOUP) POWD Take 2 oz by mouth 4 (four) times daily as needed (for meals).   Yes Historical Provider, MD  Rivaroxaban (XARELTO) 20 MG TABS Take 1 tablet (20 mg total) by mouth every evening. 09/13/12  Yes Newt Lukes, MD  sodium chloride (OCEAN) 0.65 % nasal spray Place 1 spray into the nose daily as needed for congestion. Allergies   Yes Historical Provider, MD  triamcinolone (NASACORT) 55 MCG/ACT nasal inhaler Place 2 sprays into the nose daily as needed (allergies or congestion).    Yes Historical Provider, MD  zolpidem (AMBIEN) 5 MG tablet Take 1 tablet (5 mg total) by mouth at bedtime as needed for sleep. 02/18/12  Yes Lennis Buzzy Han, MD   Physical Exam: Filed Vitals:   11/23/12 1648  BP:   Pulse:   Temp: 99.6 F (37.6 C)  Resp:      General: Female laying on a gurney in no acute cardiopulmonary distress.  Eyes: Pupils equal round and  reactive to light and accommodation. Extraocular movements intact.  ENT: Oropharynx is clear, no lesions, no exudates. Dry mucous membranes.  Neck: Supple with no lymphadenopathy. No JVD.  Cardiovascular: Irregularly irregular.  Respiratory: Clear to sedation bilaterally no wheezes, no crackles, no rhonchi.  Abdomen: Soft, nontender, nondistended, positive bowel sounds.  Skin: No rashes or lesions.  Musculoskeletal: 4/5 bilateral upper extremity strength. 4/5 bilateral lower extremity strength.  Psychiatric: Normal mood. Normal affect. Fair insight. Fair judgment.  Neurologic: Alert and oriented x3. Cranial nerves II through XII are grossly intact. Gait not tested secondary to safety.  Labs on Admission:  Basic Metabolic Panel:  Recent Labs Lab 11/23/12 1410  NA 135  K 3.5  CL 96  CO2 26  GLUCOSE 127*  BUN 12  CREATININE 0.77  CALCIUM 9.9   Liver Function Tests:  Recent Labs Lab 11/23/12 1410  AST 20  ALT 19  ALKPHOS 224*  BILITOT 0.5  PROT 7.1  ALBUMIN 3.2*   No results found for this basename: LIPASE, AMYLASE,  in the last 168 hours No results found for this basename: AMMONIA,  in the last 168 hours CBC:  Recent Labs Lab 11/23/12 1410  WBC 9.1  NEUTROABS 7.9*  HGB 10.7*  HCT 31.6*  MCV 82.9  PLT 144*   Cardiac Enzymes: No results found for this basename: CKTOTAL, CKMB, CKMBINDEX, TROPONINI,  in the last 168 hours  BNP (last 3 results) No results found for this basename: PROBNP,  in the last 8760 hours CBG: No results found for this basename: GLUCAP,  in the last 168 hours  Radiological Exams on Admission: Dg Abd Acute W/chest  11/23/2012   CLINICAL DATA:  Right upper quadrant pain, nausea, abdominal pain  EXAM: ACUTE ABDOMEN SERIES (ABDOMEN 2 VIEW & CHEST 1 VIEW)  COMPARISON:  07/29/2012  FINDINGS: Borderline cardiomegaly again noted. Right IJ Port-A-Cath is unchanged in position. Stable linear scarring in left midlung. No acute infiltrate or  pulmonary edema.  There is nonspecific nonobstructive bowel gas pattern. Prior vertebroplasty lumbar spine. IVC filter in place. Surgical clips are noted in right upper quadrant.  IMPRESSION: Negative abdominal radiographs.  No acute cardiopulmonary disease.   Electronically Signed   By: Natasha Mead  On: 11/23/2012 15:53    EKG: Independently reviewed. Atrial fibrillation with RVR.  Assessment/Plan Principal Problem:   Atrial fibrillation Active Problems:   DEPRESSION   MIGRAINE HEADACHE   HYPERTENSION   Cervical cancer   DVT (deep venous thrombosis)   Anemia of chronic disease   Thrombocytopenia   Nausea vomiting and diarrhea   Dehydration   GERD (gastroesophageal reflux disease)  #1 A. fib with RVR Patient states she is usually in A. fib with RVR with a resting heart rate in the 110s. Patient's increased heart rate likely secondary to dehydration, nausea vomiting diarrhea and inability to keep her medications down. Will cycle cardiac enzymes. Will repeat a 2-D echo. Check a TSH. Place on Lopressor 5 mg IV every 8 hours for rate control. Continue xarelto for anticoagulation.   #2 nausea/ vomiting/ diarrhea Likely secondary to recent chemotherapy. Acute abdominal series was negative. Will check stool for C. difficile PCR. Will place on scheduled Zofran. IV fluids. Supportive care. Will place on ice chips for now.  #3 dehydration Hydrated with IV fluids.  #4 history of DVT Continue home regimen of xarelto.  #5 gastroesophageal reflux disease Place on Protonix 40 mg IV daily. GI cocktail as needed.  #6 metastatic cervical carcinoma Will inform oncology on admission.  #7 hypertension Lopressor.  #8 prophylaxis PPI for GI prophylaxis. On xarelto for DVT prophylaxis.   Code Status: Full Family Communication: Updated patient and husband at bedside. Disposition Plan: Admit to telemetry  Time spent: 46 MINS  [facility] Pager [phone]  If 7PM-7AM,  please contact night-coverage [email or web address] Password [facility] 11/23/2012, 5:52 PM

## 2012-11-23 NOTE — ED Notes (Signed)
MD at bedside. 

## 2012-11-23 NOTE — ED Notes (Signed)
Patient transported to X-ray 

## 2012-11-23 NOTE — ED Notes (Signed)
Pt had chemo last Thursday. Symptoms started Sunday. C/o N/V/D, got worse last night. HA. Feel fatigue. Generalized weakness

## 2012-11-23 NOTE — Telephone Encounter (Signed)
This RN called report to ER per MD request for pt to be transferred per need for additional work up for noted AFIB in setting of nausea and vomiting post chemo 9/19.  Noted on hold for 10 minutes due to ongoing emergencies in ER.  Per third call with concern that pt may have arrived already this RN was able to give report to [facility].  Note pt was accompanied to ER by nurse tech and husband, in W/C with running IV of 1 L NS and zofran.

## 2012-11-23 NOTE — ED Notes (Signed)
Per Dr Janee Morn, Hold 2nd dose of Metoprolol until 2200

## 2012-11-23 NOTE — ED Notes (Signed)
Pt feeling nauseous, notified Dr. Rubin Payor.

## 2012-11-23 NOTE — ED Notes (Signed)
Pt made aware of need for urine 

## 2012-11-23 NOTE — ED Notes (Signed)
Unable to move pt at this time due to hospitalist changing pts bed assignment.

## 2012-11-23 NOTE — Progress Notes (Signed)
   CARE MANAGEMENT ED NOTE 11/23/2012  Patient:  [name], [name]   Account Number:  [id number]  Date Initiated:  11/23/2012  Documentation initiated by:  Edd Arbour  Subjective/Objective Assessment:     Subjective/Objective Assessment Detail:   62 year old female hx mets cervical carcinoma with mets to liver with n/v/d At cancer center EKG = A. fib with RVR she was subsequently sent to the ED.  In the ED phosphatase of 224 and albumin of 3.2 otherwise was within normal limits. CBC had a hemoglobin of 10.7 platelet count of 144. Acute abdominal series which was done was unremarkable.  given some IV fluids, also given some Zofran and Phenergan and was still vomiting, given reglan IV lopressor q 8 hr Admitted to acute unit     Action/Plan:   Action/Plan Detail:   UR completed   Anticipated DC Date:  11/26/2012     Status Recommendation to Physician:   Result of Recommendation:    Other ED Services  Consult Working Plan    DC Planning Services  Other    Choice offered to / List presented to:            Status of service:  Completed, signed off  ED Comments:   ED Comments Detail:

## 2012-11-23 NOTE — ED Notes (Signed)
PT CAME FROM CANCER CENTER DUE TO DEHYDRATION AND EKG SHOWING AFIB AND TACHYCARDIA 120BPM.  Pt states that she is always in afib.  Pt states that she has been having n/v/d since yesterday.

## 2012-11-23 NOTE — ED Notes (Signed)
Deniece Ree is MD from cancer center. There pt vitals were 142 HR, 108/86, 98 O2 on RA, 20 RR.  Pt has PMH reoccurring ovarian metastatic.   Pt given Zofran 8mg .  Anxiety

## 2012-11-23 NOTE — ED Provider Notes (Signed)
CSN: [id number]     Arrival date & time 11/23/12  1245 History   First MD Initiated Contact with Patient 11/23/12 1300     Chief Complaint  Patient presents with  . Tachycardia  . Dehydration  . NAUSEA   . Vomiting  . Diarrhea   (Consider location/radiation/quality/duration/timing/severity/associated sxs/prior Treatment) Patient is a 62 y.o. female presenting with diarrhea. The history is provided by the patient.  Diarrhea Associated symptoms: abdominal pain and vomiting   Associated symptoms: no fever and no headaches    patient was [facility] with hypotension and tachycardia. She has a history of atrial fibrillation and states she always has a fast rate. She had chemotherapy a week ago and has had nausea vomiting diarrhea since. She states she's been able to keep all of her medicines down. No fevers. No cough. Chronic abdominal pain is unchanged. She's had a little burning with urination and her urines been more concentrated. She is on Xarelto. She has failed cardioversion in the past.  Past Medical History  Diagnosis Date  . Diastolic dysfunction   . MIGRAINE HEADACHE   . Atrial fibrillation     failed DCCV, CHADs2-prev pradaxa stopped due to hematuria with ureter issues/JJ   . Anemia   . HTN (hypertension)   . GLAUCOMA   . CARPAL TUNNEL SYNDROME, RIGHT   . Lymphedema     L>R leg from pelvic XRT/scarring  . Hepatic steatosis     CT scan 06/2009, 12/2009  . DVT (deep venous thrombosis) 10/2010    LLE, anticoag resumed  . CHF (congestive heart failure)     1 yr ago per pt  . Arthritis   . GERD (gastroesophageal reflux disease)   . Small kidney     left  . Radiation 10/11/11-11/19/11    5040 cGy in 28 fx's/periaortic  . Radiation 03/16/10-03/25/10    right inguinal node/left external iliac node  . Radiation 06/11/08-07/23/08    6000 cGy left pelvis  . Hyperlipidemia   . Leg swelling     left  . Cervical cancer dx 04/2003, recur 04/2008    s/p debulk  (TAHBSO), chemo/XRT; recurrent dz L pelvis dx 2010 and liver met 09/2010 s/p RFA  . Osteoporosis with fracture 2014    compression fx, s/p KP ; started prolia 06/2012- narcotic pain mgmt   Past Surgical History  Procedure Laterality Date  . Ureteral stent placement  2005, 01/2010, 07/2010    L hydro related to cervical ca and XRT  . Oophorectomy  2005  . Cardioversion    . Tonsillectomy    . Cholecystectomy  1984  . Total abdominal hysterectomy  2005  . Carpal tunnel  2009    right  . Ivc filter  04/2011   Family History  Problem Relation Age of Onset  . Lung cancer    . Hypertension    . Heart disease    . Stroke    . Asthma    . Asthma Son   . Cancer Mother     lung  . Stroke Father    History  Substance Use Topics  . Smoking status: Former Smoker -- 0.50 packs/day for 3 years    Quit date: 03/01/1984  . Smokeless tobacco: Never Used  . Alcohol Use: No   OB History   Grav Para Term Preterm Abortions TAB SAB Ect Mult Living                 Review  of Systems  Constitutional: Positive for activity change, appetite change and fatigue. Negative for fever.  HENT: Negative for neck stiffness.   Eyes: Negative for pain.  Respiratory: Negative for chest tightness and shortness of breath.   Cardiovascular: Negative for chest pain and leg swelling.  Gastrointestinal: Positive for nausea, vomiting, abdominal pain and diarrhea.  Genitourinary: Positive for decreased urine volume. Negative for flank pain.  Musculoskeletal: Negative for back pain.  Skin: Negative for rash.  Neurological: Negative for weakness, numbness and headaches.  Psychiatric/Behavioral: Negative for behavioral problems.    Allergies  Codeine; Flecainide acetate; Morphine; and Propafenone hcl  Home Medications   Current Outpatient Rx  Name  Route  Sig  Dispense  Refill  . bisacodyl (DULCOLAX) 5 MG EC tablet   Oral   Take 5 mg by mouth daily as needed for constipation.         . cetirizine  (ZYRTEC) 10 MG tablet   Oral   Take 10 mg by mouth every evening.          . citalopram (CELEXA) 20 MG tablet   Oral   Take 20 mg by mouth every evening.          . fentaNYL (DURAGESIC - DOSED MCG/HR) 100 MCG/HR   Transdermal   Place 1 patch (100 mcg total) onto the skin every 3 (three) days. Total dose 125mg    10 patch   0   . fentaNYL (DURAGESIC - DOSED MCG/HR) 25 MCG/HR patch   Transdermal   Place 1 patch (25 mcg total) onto the skin every 3 (three) days. Total dose of 125 mcg   10 patch   0   . ferrous fumarate (HEMOCYTE - 106 MG FE) 325 (106 FE) MG TABS   Oral   Take 1 tablet (106 mg of iron total) by mouth daily.   30 each   0   . folic acid (FOLVITE) 1 MG tablet   Oral   Take 1 mg by mouth every morning.         [name] HYDROmorphone (DILAUDID) 4 MG tablet      Take 1 tablet every 4 hours as needed for pain   120 tablet   0   . ibuprofen (ADVIL,MOTRIN) 200 MG tablet   Oral   Take 600 mg by mouth 3 (three) times daily as needed for pain. Pain         . lidocaine-prilocaine (EMLA) cream   Topical   Apply 1 application topically daily as needed (for port access).         . LORazepam (ATIVAN) 1 MG tablet   Oral   Take 1 tablet (1 mg total) by mouth every 6 (six) hours as needed (for nausea (may put under tongue)).   30 tablet   2   . metoprolol (LOPRESSOR) 50 MG tablet   Oral   Take 50 mg by mouth 2 (two) times daily as needed (bp under 160/90).         . Nutritional Supplements (CARNATION INSTANT BREAKFAST PO)   Oral   Take 1 Can by mouth 4 (four) times daily as needed (for meals).         [name] omeprazole (PRILOSEC) 20 MG capsule   Oral   Take 20 mg by mouth every morning.          . ondansetron (ZOFRAN) 8 MG tablet   Oral   Take 1 tablet (8 mg total) by mouth every 8 (eight) hours as  needed for nausea.   30 tablet   2   . polyethylene glycol (MIRALAX / GLYCOLAX) packet   Oral   Take 17 g by mouth 2 (two) times daily as needed  (constipation).          [name] PRESCRIPTION MEDICATION   Intravenous   Inject into the vein every 21 ( twenty-one) days. PEMEtrexed (ALIMTA) 950 mg in sodium chloride 0.9 % 100 mL chemo infusion 500 mg/m2  1.91 m2 (Treatment Plan Actual) md Welton Flakes         . protein supplement (UNJURY CHICKEN SOUP) POWD   Oral   Take 2 oz by mouth 4 (four) times daily as needed (for meals).         . Rivaroxaban (XARELTO) 20 MG TABS   Oral   Take 1 tablet (20 mg total) by mouth every evening.   30 tablet   5   . sodium chloride (OCEAN) 0.65 % nasal spray   Nasal   Place 1 spray into the nose daily as needed for congestion. Allergies         . triamcinolone (NASACORT) 55 MCG/ACT nasal inhaler   Nasal   Place 2 sprays into the nose daily as needed (allergies or congestion).          [name] zolpidem (AMBIEN) 5 MG tablet   Oral   Take 1 tablet (5 mg total) by mouth at bedtime as needed for sleep.   20 tablet   0    BP 122/80  Pulse 131  Temp(Src) 99.6 F (37.6 C) (Oral)  Resp 23  SpO2 97% Physical Exam  Nursing note and vitals reviewed. Constitutional: She is oriented to person, place, and time. She appears well-developed.  HENT:  Head: Normocephalic and atraumatic.  Eyes: EOM are normal. Pupils are equal, round, and reactive to light.  Neck: Normal range of motion. Neck supple.  Cardiovascular: Normal heart sounds.   No murmur heard. Irregular tachycardia  Pulmonary/Chest: Effort normal and breath sounds normal. No respiratory distress. She has no wheezes. She has no rales.  Port-A-Cath to right chest wall  Abdominal: Soft. Bowel sounds are normal. She exhibits no distension. There is tenderness. There is no rebound and no guarding.  Musculoskeletal: Normal range of motion.  Neurological: She is alert and oriented to person, place, and time. No cranial nerve deficit.  Skin: Skin is warm and dry.  Psychiatric: She has a normal mood and affect. Her speech is normal.    ED Course   Procedures (including critical care time) Labs Review Labs Reviewed  CBC WITH DIFFERENTIAL - Abnormal; Notable for the following:    RBC 3.81 (*)    Hemoglobin 10.7 (*)    HCT 31.6 (*)    RDW 16.1 (*)    Platelets 144 (*)    Neutrophils Relative % 86 (*)    Lymphocytes Relative 6 (*)    Neutro Abs 7.9 (*)    Lymphs Abs 0.5 (*)    All other components within normal limits  COMPREHENSIVE METABOLIC PANEL - Abnormal; Notable for the following:    Glucose, Bld 127 (*)    Albumin 3.2 (*)    Alkaline Phosphatase 224 (*)    GFR calc non Af Amer 89 (*)    All other components within normal limits  URINALYSIS, ROUTINE W REFLEX MICROSCOPIC - Abnormal; Notable for the following:    Color, Urine ORANGE (*)    APPearance CLOUDY (*)    Hgb urine dipstick TRACE (*)  Bilirubin Urine MODERATE (*)    Ketones, ur 40 (*)    Protein, ur 100 (*)    Leukocytes, UA SMALL (*)    All other components within normal limits  URINE MICROSCOPIC-ADD ON - Abnormal; Notable for the following:    Bacteria, UA FEW (*)    Casts HYALINE CASTS (*)    All other components within normal limits  URINE CULTURE  CG4 I-STAT (LACTIC ACID)   Imaging Review Dg Abd Acute W/chest  11/23/2012   CLINICAL DATA:  Right upper quadrant pain, nausea, abdominal pain  EXAM: ACUTE ABDOMEN SERIES (ABDOMEN 2 VIEW & CHEST 1 VIEW)  COMPARISON:  07/29/2012  FINDINGS: Borderline cardiomegaly again noted. Right IJ Port-A-Cath is unchanged in position. Stable linear scarring in left midlung. No acute infiltrate or pulmonary edema.  There is nonspecific nonobstructive bowel gas pattern. Prior vertebroplasty lumbar spine. IVC filter in place. Surgical clips are noted in right upper quadrant.  IMPRESSION: Negative abdominal radiographs.  No acute cardiopulmonary disease.   Electronically Signed   By: Natasha Mead   On: 11/23/2012 15:53    Date: 11/23/2012  Rate: 116  Rhythm: atrial fibrillation with RVR  QRS Axis: normal  Intervals: afib   ST/T Wave abnormalities: normal  Conduction Disutrbances:afib  Narrative Interpretation:   Old EKG Reviewed: unchanged   MDM   1. Nausea vomiting and diarrhea   2. Atrial fibrillation with RVR   3. Metastatic cancer    Patient with nausea vomiting and diarrhea likely related to her chemotherapy. Has been unable to tolerate orals. He will be admitted to internal medicine. Also has atrophic relation with RVR. Has a history of chronic A. fib and chronically elevated heart rates. She is on anticoagulation.  Patient has few bacteria and 3-6 white cells in urine. We'll defer treatment decision to internal medicine.    Juliet Rude. Rubin Payor, MD 11/23/12 [id number]

## 2012-11-24 ENCOUNTER — Ambulatory Visit: Payer: 59

## 2012-11-24 DIAGNOSIS — D638 Anemia in other chronic diseases classified elsewhere: Secondary | ICD-10-CM

## 2012-11-24 DIAGNOSIS — C539 Malignant neoplasm of cervix uteri, unspecified: Secondary | ICD-10-CM

## 2012-11-24 DIAGNOSIS — I369 Nonrheumatic tricuspid valve disorder, unspecified: Secondary | ICD-10-CM

## 2012-11-24 LAB — PROTIME-INR: Prothrombin Time: 15.8 seconds — ABNORMAL HIGH (ref 11.6–15.2)

## 2012-11-24 LAB — COMPREHENSIVE METABOLIC PANEL
ALT: 15 U/L (ref 0–35)
AST: 17 U/L (ref 0–37)
CO2: 26 mEq/L (ref 19–32)
Calcium: 9.4 mg/dL (ref 8.4–10.5)
GFR calc non Af Amer: 70 mL/min — ABNORMAL LOW (ref >90)
Potassium: 3.2 mEq/L — ABNORMAL LOW (ref 3.5–5.1)
Sodium: 138 mEq/L (ref 135–145)
Total Protein: 6.8 g/dL (ref 6.0–8.3)

## 2012-11-24 LAB — CBC
HCT: 31.8 % — ABNORMAL LOW (ref 36.0–46.0)
Hemoglobin: 10.6 g/dL — ABNORMAL LOW (ref 12.0–15.0)
MCH: 28 pg (ref 26.0–34.0)
MCHC: 33.3 g/dL (ref 30.0–36.0)

## 2012-11-24 LAB — TROPONIN I: Troponin I: <0.3 ng/mL (ref <0.30)

## 2012-11-24 LAB — URINE CULTURE
Colony Count: NO GROWTH
Culture: NO GROWTH

## 2012-11-24 MED ORDER — METOPROLOL TARTRATE 50 MG PO TABS
75.0000 mg | ORAL_TABLET | Freq: Two times a day (BID) | ORAL | Status: DC
  Administered 2012-11-24 – 2012-11-28 (×9): 75 mg via ORAL
  Filled 2012-11-24 (×10): qty 1

## 2012-11-24 MED ORDER — PANTOPRAZOLE SODIUM 40 MG PO TBEC: 40.0000 mg | DELAYED_RELEASE_TABLET | Freq: Every day | ORAL | Status: DC

## 2012-11-24 MED ORDER — PANTOPRAZOLE SODIUM 40 MG IV SOLR
40.0000 mg | Freq: Every day | INTRAVENOUS | Status: DC
  Administered 2012-11-24 – 2012-11-28 (×5): 40 mg via INTRAVENOUS
  Filled 2012-11-24 (×6): qty 40

## 2012-11-24 MED ORDER — FOLIC ACID 1 MG PO TABS
1.0000 mg | ORAL_TABLET | Freq: Every day | ORAL | Status: DC
  Administered 2012-11-24 – 2012-11-28 (×5): 1 mg via ORAL
  Filled 2012-11-24 (×5): qty 1

## 2012-11-24 NOTE — Progress Notes (Signed)
Pt's heart rate up to 170 when she goes to the bathroom. Pt denies feeling weak or lightheaded. Lopressor given. Will cont to monitor and let midlevel know.

## 2012-11-24 NOTE — Progress Notes (Signed)
OT Cancellation Note  Patient Details Name: [name] MRN: [id number] DOB: 1951-01-14   Cancelled Treatment:    Reason Eval/Treat Not Completed: Patient at procedure or test/ unavailable. Will recheck onpt next day  Averiana Clouatre, [place] Kung 11/24/2012, 10:59 AM

## 2012-11-24 NOTE — Evaluation (Signed)
Physical Therapy Evaluation Patient Details Name: [name] MRN: [id number] DOB: 05-23-1950 Today's Date: 11/24/2012 Time: 4098-1191 PT Time Calculation (min): 15 min  PT Assessment / Plan / Recommendation History of Present Illness  62 y.o. female   history of metastatic cervical carcinoma with metastases to the liver, prior history of radiation and concurrent chemotherapy, currently on chemotherapy with last therapy one week prior to admission, atrial fibrillation on chronic anticoagulation, hypertension, history of DVT, gastroesophageal reflux disease, who presents to the ED with a 2 to three-day history of nausea and intractable emesis, one-day history of diarrhea, one week history of generalized weakness and not feeling well. Patient does endorse some chills, low-grade fever, chronic right upper abdominal pain, some dysuria. Patient also complained of significant heartburn  Clinical Impression  On eval, pt required supervision level assist for mobility-able to ambulate ~500 feet. HR increased to 170s-180s (RN and MD already aware of this). Anticipate pt will progress well with mobility. Will follow up for 1 more visit.     PT Assessment  Patient needs continued PT services    Follow Up Recommendations  No PT follow up    Does the patient have the potential to tolerate intense rehabilitation      Barriers to Discharge        Equipment Recommendations  None recommended by PT    Recommendations for Other Services     Frequency Min 2X/week    Precautions / Restrictions Precautions Precautions: None Restrictions Weight Bearing Restrictions: No   Pertinent Vitals/Pain Back 6/10 (chronic pain)     Mobility  Bed Mobility Bed Mobility: Supine to Sit;Sit to Supine Supine to Sit: 6: Modified independent (Device/Increase time) Sit to Supine: 6: Modified independent (Device/Increase time) Transfers Transfers: Sit to Stand;Stand to Sit Sit to Stand: 6: Modified independent  (Device/Increase time) Stand to Sit: 6: Modified independent (Device/Increase time) Ambulation/Gait Ambulation/Gait Assistance: 5: Supervision Ambulation Distance (Feet): 500 Feet Ambulation/Gait Assistance Details: slow gait speed. HR increased to 170s-180s with ambulation Gait Pattern: Within Functional Limits    Exercises     PT Diagnosis: Generalized weakness  PT Problem List: Decreased mobility PT Treatment Interventions: Gait training;Stair training;Functional mobility training;Therapeutic activities;Therapeutic exercise;Patient/family education     PT Goals(Current goals can be found in the care plan section) Acute Rehab PT Goals Patient Stated Goal: get better and get home PT Goal Formulation: With patient Time For Goal Achievement: 12/01/12 Potential to Achieve Goals: Good  Visit Information  Last PT Received On: 11/24/12 Assistance Needed: +1 History of Present Illness: 62 y.o. female   history of metastatic cervical carcinoma with metastases to the liver, prior history of radiation and concurrent chemotherapy, currently on chemotherapy with last therapy one week prior to admission, atrial fibrillation on chronic anticoagulation, hypertension, history of DVT, gastroesophageal reflux disease, who presents to the ED with a 2 to three-day history of nausea and intractable emesis, one-day history of diarrhea, one week history of generalized weakness and not feeling well. Patient does endorse some chills, low-grade fever, chronic right upper abdominal pain, some dysuria. Patient also complained of significant heartburn       Prior Functioning  Home Living Family/patient expects to be discharged to:: Private residence Living Arrangements: Spouse/significant other Available Help at Discharge: Family Type of Home: House Home Access: Stairs to enter Secretary/administrator of Steps: 2 Entrance Stairs-Rails: Right Home Equipment: Environmental consultant - 2 wheels;Bedside commode;Wheelchair -  manual Prior Function Level of Independence: Independent Communication Communication: No difficulties    Cognition  Cognition Arousal/Alertness: Awake/alert Behavior During Therapy: WFL for tasks assessed/performed Overall Cognitive Status: Within Functional Limits for tasks assessed    Extremity/Trunk Assessment Upper Extremity Assessment Upper Extremity Assessment: Generalized weakness Lower Extremity Assessment Lower Extremity Assessment: Generalized weakness Cervical / Trunk Assessment Cervical / Trunk Assessment: Normal   Balance    End of Session PT - End of Session Activity Tolerance: Patient tolerated treatment well (Increased HR 170s-180s) Patient left: in bed;with call bell/phone within reach;with family/visitor present  GP     Rebeca Alert, MPT Pager: [phone]

## 2012-11-24 NOTE — Plan of Care (Signed)
Problem: Phase I Progression Outcomes Goal: Pain controlled with appropriate interventions Outcome: Not Applicable Date Met:  11/24/12 No c/o's of pain.

## 2012-11-24 NOTE — Progress Notes (Signed)
Pt HR 170-173 when out of bed to the bathroom. HR sustaining 150-170 from 0816 to 0831 with several episodes of HR 170.  Pt HR 109 at 0857 while in the bed. MD notified.  No new orders received.

## 2012-11-24 NOTE — Progress Notes (Signed)
Echocardiogram 2D Echocardiogram has been performed.  [name] 11/24/2012, 11:37 AM

## 2012-11-24 NOTE — Plan of Care (Signed)
Problem: Phase I Progression Outcomes Goal: Hemodynamically stable Outcome: Not Progressing Heart rate goes up to 170's when up to bathroom. Lopressor given per IV.

## 2012-11-24 NOTE — Progress Notes (Addendum)
[facility] PROGRESS NOTE  [name]:[id number] DOB: 1950/09/24 DOA: 11/23/2012 PCP: [name], MD  Assessment/Plan: 1. Nausea/Vomiting/Diarrhea -likely chemotherapy related -Diarrhea improving, Cdiff PCR pending -cut down IVF -Start clears, advance as tolerated  2. Afib with RVR  -uncontrolled HR going up to 170s with activity -suspect rate driven in part by above, failed DCCV  -resume PO metoprolol at higher dose 75mg  BID -TSH normal, ECHO pending -if poor response to med mgt will ask Cards-EP to eval tomorrow  3. H/o DVT -continue xarelto  4. Metastatic cervical CA -recent Chemo, continue home regimen of narcotics -Dr.Khan sent her to the ER yesterday,  aware of admission  5. HTN -stable, continue metoprolol   Code Status: Full Family Communication: none at bedside Disposition Plan: home when improved   Consultants:  Dr.Khan aware of admission  HPI/Subjective: Feels ok, mild chronic abd pain, no further vomiting, still feels somewhat nauseous  Objective: Filed Vitals:   11/24/12 0500  BP: 119/75  Pulse: 107  Temp: 98.9 F (37.2 C)  Resp: 18    Intake/Output Summary (Last 24 hours) at 11/24/12 0946 Last data filed at 11/24/12 0228  Gross per 24 hour  Intake      0 ml  Output    300 ml  Net   -300 ml   Filed Weights   11/23/12 1843 11/24/12 0500  Weight: 76.975 kg (169 lb 11.2 oz) 74.299 kg (163 lb 12.8 oz)    Exam:   General: AAox3, no distress  Cardiovascular: S1S2/Irregular rate and rhythm  Respiratory: CTAB  Abdomen: soft, Mild R sided tenderness, no rigidity or rebound, BS present  Musculoskeletal: no edema c/c   Data Reviewed: Basic Metabolic Panel:  Recent Labs Lab 11/23/12 1410 11/24/12 0535  NA 135 138  K 3.5 3.2*  CL 96 98  CO2 26 26  GLUCOSE 127* 109*  BUN 12 11  CREATININE 0.77 0.88  CALCIUM 9.9 9.4   Liver Function Tests:  Recent Labs Lab 11/23/12 1410 11/24/12 0535  AST 20 17  ALT 19  15  ALKPHOS 224* 214*  BILITOT 0.5 0.4  PROT 7.1 6.8  ALBUMIN 3.2* 3.1*   No results found for this basename: LIPASE, AMYLASE,  in the last 168 hours No results found for this basename: AMMONIA,  in the last 168 hours CBC:  Recent Labs Lab 11/23/12 1410 11/24/12 0535  WBC 9.1 14.1*  NEUTROABS 7.9*  --   HGB 10.7* 10.6*  HCT 31.6* 31.8*  MCV 82.9 84.1  PLT 144* 158   Cardiac Enzymes:  Recent Labs Lab 11/23/12 1821 11/24/12 0007 11/24/12 0535  TROPONINI <0.30 <0.30 <0.30   BNP (last 3 results) No results found for this basename: PROBNP,  in the last 8760 hours CBG: No results found for this basename: GLUCAP,  in the last 168 hours  No results found for this or any previous visit (from the past 240 hour(s)).   Studies: Dg Abd Acute W/chest  11/23/2012   CLINICAL DATA:  Right upper quadrant pain, nausea, abdominal pain  EXAM: ACUTE ABDOMEN SERIES (ABDOMEN 2 VIEW & CHEST 1 VIEW)  COMPARISON:  07/29/2012  FINDINGS: Borderline cardiomegaly again noted. Right IJ Port-A-Cath is unchanged in position. Stable linear scarring in left midlung. No acute infiltrate or pulmonary edema.  There is nonspecific nonobstructive bowel gas pattern. Prior vertebroplasty lumbar spine. IVC filter in place. Surgical clips are noted in right upper quadrant.  IMPRESSION: Negative abdominal radiographs.  No acute cardiopulmonary disease.  Electronically Signed   By: Natasha Mead   On: 11/23/2012 15:53    Scheduled Meds: . citalopram  20 mg Oral QPM  . fentaNYL  100 mcg Transdermal Q72H  . fentaNYL  25 mcg Transdermal Q72H  . ferrous fumarate  1 tablet Oral Daily  . loratadine  10 mg Oral Daily  . metoprolol tartrate  75 mg Oral BID  . ondansetron  8 mg Intravenous Q8H  . pantoprazole (PROTONIX) IV  40 mg Intravenous Daily  . Rivaroxaban  20 mg Oral Q supper  . sodium chloride  3 mL Intravenous Q12H   Continuous Infusions: . sodium chloride 125 mL/hr at 11/24/12 0228    Principal Problem:    Atrial fibrillation Active Problems:   DEPRESSION   MIGRAINE HEADACHE   HYPERTENSION   Cervical cancer   DVT (deep venous thrombosis)   Anemia of chronic disease   Thrombocytopenia   Nausea vomiting and diarrhea   Dehydration   GERD (gastroesophageal reflux disease)    Time spent:    [facility]  [facility] Pager [phone]. If 7PM-7AM, please contact night-coverage at [email or web address], password [facility] 11/24/2012, 9:46 AM  LOS: 1 day

## 2012-11-25 ENCOUNTER — Ambulatory Visit: Payer: 59

## 2012-11-25 MED ORDER — SODIUM CHLORIDE 0.9 % IV SOLN
INTRAVENOUS | Status: DC
  Administered 2012-11-25 – 2012-11-28 (×4): via INTRAVENOUS
  Filled 2012-11-25 (×9): qty 1000

## 2012-11-25 MED ORDER — DILTIAZEM HCL 30 MG PO TABS
30.0000 mg | ORAL_TABLET | Freq: Four times a day (QID) | ORAL | Status: DC
  Administered 2012-11-25 – 2012-11-26 (×4): 30 mg via ORAL
  Filled 2012-11-25 (×8): qty 1

## 2012-11-25 NOTE — Progress Notes (Signed)
[facility] PROGRESS NOTE  [name]:[id number] DOB: 16-Jan-1951 DOA: 11/23/2012 PCP: [name], MD  Assessment/Plan: 1. Nausea/Vomiting/Diarrhea -likely chemotherapy related -Diarrhea resolved, Cdiff PCR pending -cut down IVF -advance to full liquids   2. Afib with RVR  -uncontrolled HR, in 110-120s peak rates 150 -suspect rate driven in part by above, failed DCCV  -on metoprolol  75mg  BID, add Cardizem 30mg  Q6 -TSH normal, ECHO pending -if poor response to med mgt will ask Cards-EP to eval   3. H/o DVT -continue xarelto  4. Metastatic cervical CA -recent Chemo, continue home regimen of narcotics -Dr.Khan sent her to the ER yesterday,  aware of admission  5. HTN -stable, continue metoprolol   Code Status: Full Family Communication: none at bedside Disposition Plan: home when improved   Consultants:  Dr.Khan aware of admission  HPI/Subjective: Feels ok, still with some nausea but improved Objective: Filed Vitals:   11/25/12 0537  BP: 119/83  Pulse: 85  Temp: 98.2 F (36.8 C)  Resp: 20    Intake/Output Summary (Last 24 hours) at 11/25/12 1406 Last data filed at 11/25/12 0700  Gross per 24 hour  Intake 3228.33 ml  Output      0 ml  Net 3228.33 ml   Filed Weights   11/23/12 1843 11/24/12 0500 11/25/12 0537  Weight: 76.975 kg (169 lb 11.2 oz) 74.299 kg (163 lb 12.8 oz) 75.615 kg (166 lb 11.2 oz)    Exam:   General: AAox3, no distress  Cardiovascular: S1S2/Irregular rate and rhythm  Respiratory: CTAB  Abdomen: soft, Mild R sided tenderness, no rigidity or rebound, BS present  Musculoskeletal: no edema c/c   Data Reviewed: Basic Metabolic Panel:  Recent Labs Lab 11/23/12 1410 11/24/12 0535  NA 135 138  K 3.5 3.2*  CL 96 98  CO2 26 26  GLUCOSE 127* 109*  BUN 12 11  CREATININE 0.77 0.88  CALCIUM 9.9 9.4   Liver Function Tests:  Recent Labs Lab 11/23/12 1410 11/24/12 0535  AST 20 17  ALT 19 15  ALKPHOS 224*  214*  BILITOT 0.5 0.4  PROT 7.1 6.8  ALBUMIN 3.2* 3.1*   No results found for this basename: LIPASE, AMYLASE,  in the last 168 hours No results found for this basename: AMMONIA,  in the last 168 hours CBC:  Recent Labs Lab 11/23/12 1410 11/24/12 0535  WBC 9.1 14.1*  NEUTROABS 7.9*  --   HGB 10.7* 10.6*  HCT 31.6* 31.8*  MCV 82.9 84.1  PLT 144* 158   Cardiac Enzymes:  Recent Labs Lab 11/23/12 1821 11/24/12 0007 11/24/12 0535  TROPONINI <0.30 <0.30 <0.30   BNP (last 3 results) No results found for this basename: PROBNP,  in the last 8760 hours CBG: No results found for this basename: GLUCAP,  in the last 168 hours  Recent Results (from the past 240 hour(s))  URINE CULTURE     Status: None   Collection Time    11/23/12  2:36 PM      Result Value Range Status   Specimen Description URINE, CLEAN CATCH   Final   Special Requests NONE   Final   Culture  Setup Time     Final   Value: 11/23/2012 21:36     Performed at [facility] Count     Final   Value: NO GROWTH     Performed at [facility]   Culture     Final   Value: NO GROWTH  Performed at [facility]   Report Status 11/24/2012 FINAL   Final     Studies: Dg Abd Acute W/chest  11/23/2012   CLINICAL DATA:  Right upper quadrant pain, nausea, abdominal pain  EXAM: ACUTE ABDOMEN SERIES (ABDOMEN 2 VIEW & CHEST 1 VIEW)  COMPARISON:  07/29/2012  FINDINGS: Borderline cardiomegaly again noted. Right IJ Port-A-Cath is unchanged in position. Stable linear scarring in left midlung. No acute infiltrate or pulmonary edema.  There is nonspecific nonobstructive bowel gas pattern. Prior vertebroplasty lumbar spine. IVC filter in place. Surgical clips are noted in right upper quadrant.  IMPRESSION: Negative abdominal radiographs.  No acute cardiopulmonary disease.   Electronically Signed   By: Natasha Mead   On: 11/23/2012 15:53    Scheduled Meds: . citalopram  20 mg Oral QPM  . diltiazem  30  mg Oral Q6H  . fentaNYL  100 mcg Transdermal Q72H  . fentaNYL  25 mcg Transdermal Q72H  . ferrous fumarate  1 tablet Oral Daily  . folic acid  1 mg Oral Daily  . loratadine  10 mg Oral Daily  . metoprolol tartrate  75 mg Oral BID  . ondansetron  8 mg Intravenous Q8H  . pantoprazole (PROTONIX) IV  40 mg Intravenous Daily  . Rivaroxaban  20 mg Oral Q supper  . sodium chloride  3 mL Intravenous Q12H   Continuous Infusions: . sodium chloride 0.9 % 1,000 mL with potassium chloride 40 mEq infusion 75 mL/hr at 11/25/12 1139    Principal Problem:   Atrial fibrillation Active Problems:   DEPRESSION   MIGRAINE HEADACHE   HYPERTENSION   Cervical cancer   DVT (deep venous thrombosis)   Anemia of chronic disease   Thrombocytopenia   Nausea vomiting and diarrhea   Dehydration   GERD (gastroesophageal reflux disease)    Time spent:    [facility]  [facility] Pager [phone]. If 7PM-7AM, please contact night-coverage at [email or web address], password [facility] 11/25/2012, 2:06 PM  LOS: 2 days

## 2012-11-25 NOTE — Progress Notes (Signed)
Occupational Therapy Discharge Patient Details Name: SHANIKIA KERNODLE MRN: [id number] DOB: 20-Feb-1951 Today's Date: 11/25/2012 Time:  -     Patient discharged from OT services secondary to pt and husband do not feel that there are any OT needs that need to be addressed at this time. Acute OT will sign off..  Please see latest therapy progress note for current level of functioning and progress toward goals.    Progress and discharge plan discussed with patient and/or caregiver: Patient/Caregiver agrees with plan      Evette Georges  [phone]  11/25/2012, 12:27 PM

## 2012-11-26 LAB — BASIC METABOLIC PANEL
BUN: 6 mg/dL (ref 6–23)
CO2: 23 mEq/L (ref 19–32)
Calcium: 8.5 mg/dL (ref 8.4–10.5)
Creatinine, Ser: 0.85 mg/dL (ref 0.50–1.10)
GFR calc Af Amer: 85 mL/min — ABNORMAL LOW (ref >90)
GFR calc non Af Amer: 73 mL/min — ABNORMAL LOW (ref >90)
Glucose, Bld: 77 mg/dL (ref 70–99)
Sodium: 135 mEq/L (ref 135–145)

## 2012-11-26 LAB — CBC
HCT: 24.1 % — ABNORMAL LOW (ref 36.0–46.0)
Hemoglobin: 8.3 g/dL — ABNORMAL LOW (ref 12.0–15.0)
MCH: 29.3 pg (ref 26.0–34.0)
MCHC: 34.4 g/dL (ref 30.0–36.0)
RDW: 16.5 % — ABNORMAL HIGH (ref 11.5–15.5)

## 2012-11-26 MED ORDER — DILTIAZEM HCL ER COATED BEADS 180 MG PO CP24
180.0000 mg | ORAL_CAPSULE | Freq: Every day | ORAL | Status: DC
  Administered 2012-11-26 – 2012-11-28 (×3): 180 mg via ORAL
  Filled 2012-11-26 (×3): qty 1

## 2012-11-26 MED ORDER — PROMETHAZINE HCL 25 MG/ML IJ SOLN
25.0000 mg | Freq: Four times a day (QID) | INTRAMUSCULAR | Status: DC | PRN
  Administered 2012-11-26: 10:00:00 25 mg via INTRAVENOUS
  Filled 2012-11-26: qty 1

## 2012-11-26 MED ORDER — SODIUM CHLORIDE 0.9 % IJ SOLN
10.0000 mL | INTRAMUSCULAR | Status: DC | PRN
  Administered 2012-11-26 – 2012-11-28 (×2): 10 mL

## 2012-11-26 MED ORDER — UNJURY CHOCOLATE CLASSIC POWDER
2.0000 [oz_av] | Freq: Four times a day (QID) | ORAL | Status: DC
  Administered 2012-11-26 – 2012-11-28 (×5): 2 [oz_av] via ORAL
  Filled 2012-11-26 (×11): qty 27

## 2012-11-26 NOTE — Progress Notes (Signed)
INITIAL NUTRITION ASSESSMENT  DOCUMENTATION CODES Per approved criteria  -Obesity Unspecified   INTERVENTION: - Unjury protein powder, 2 oz qid, each supplement provides 100 calories and 21 g of protein - recommend that pt continue drinking carnation instant breakfast essentials qid at home to increase calories and protein.   NUTRITION DIAGNOSIS: Inadequate oral intake related to cancer as evidenced by reported intake less than estimated needs.   Goal: Pt to meet >/= 90% of their estimated nutrition needs   Monitor:  Weight, po intake, labs, acceptance of supplements  Reason for Assessment: MST  62 y.o. female  Admitting Dx: Atrial fibrillation  ASSESSMENT: Pt with PMH of metastatic cervical carcinoma with metastasis to the liver, prior history of radiation and concurrent chemotherapy, currently on chemotherapy with last therapy one week prior to admission. Pt admitted with 2-3 day history of nausea/vomiting, one day history of diarrhea, and one week history of generalized weakness. Per MD note, this is likely related to chemo treatment. Pt also found to have afib with RVR.   Pt reported that she uses Sport and exercise psychologist and Unjury Protein Powder at home. She does not like Nurse, adult or Ensure Complete. Per RN, pt will be advanced today to a bland diet. Pt reports tolerating her liquid diet well and that she has had an increase in appetite.   Height: Ht Readings from Last 1 Encounters:  11/23/12 5\' 1"  (1.549 m)    Weight: Wt Readings from Last 1 Encounters:  11/26/12 171 lb 6.4 oz (77.747 kg)    Ideal Body Weight: 47.8 kg  % Ideal Body Weight: 163%  Wt Readings from Last 10 Encounters:  11/26/12 171 lb 6.4 oz (77.747 kg)  11/23/12 163 lb 11.2 oz (74.254 kg)  11/16/12 175 lb 3.2 oz (79.47 kg)  11/02/12 171 lb 6.4 oz (77.747 kg)  10/26/12 175 lb 4.8 oz (79.516 kg)  10/03/12 178 lb 4.8 oz (80.876 kg)  09/15/12 181 lb 9.6 oz (82.373 kg)   08/15/12 192 lb 1.6 oz (87.136 kg)  08/11/12 187 lb (84.823 kg)  08/03/12 191 lb 9.3 oz (86.9 kg)    Usual Body Weight: unknown  % Usual Body Weight: unknown  BMI:  Body mass index is 32.4 kg/(m^2).  Estimated Nutritional Needs: Kcal: 1900-2200 Protein: 90-105 g Fluid: 1.9-2.2 L  Skin: WNL  Diet Order: Parke Simmers  EDUCATION NEEDS: -No education needs identified at this time   Intake/Output Summary (Last 24 hours) at 11/26/12 1406 Last data filed at 11/25/12 1828  Gross per 24 hour  Intake 609.25 ml  Output      0 ml  Net 609.25 ml    Last BM: 9/25   Labs:   Recent Labs Lab 11/23/12 1410 11/24/12 0535 11/26/12 0445  NA 135 138 135  K 3.5 3.2* 3.7  CL 96 98 104  CO2 26 26 23   BUN 12 11 6   CREATININE 0.77 0.88 0.85  CALCIUM 9.9 9.4 8.5  GLUCOSE 127* 109* 77    CBG (last 3)  No results found for this basename: GLUCAP,  in the last 72 hours  Scheduled Meds: . citalopram  20 mg Oral QPM  . diltiazem  180 mg Oral Daily  . fentaNYL  100 mcg Transdermal Q72H  . fentaNYL  25 mcg Transdermal Q72H  . ferrous fumarate  1 tablet Oral Daily  . folic acid  1 mg Oral Daily  . loratadine  10 mg Oral Daily  . metoprolol tartrate  75 mg Oral  BID  . ondansetron  8 mg Intravenous Q8H  . pantoprazole (PROTONIX) IV  40 mg Intravenous Daily  . Rivaroxaban  20 mg Oral Q supper  . sodium chloride  3 mL Intravenous Q12H    Continuous Infusions: . sodium chloride 0.9 % 1,000 mL with potassium chloride 40 mEq infusion 75 mL/hr at 11/26/12 1153    Past Medical History  Diagnosis Date  . Diastolic dysfunction   . MIGRAINE HEADACHE   . Atrial fibrillation     failed DCCV, CHADs2-prev pradaxa stopped due to hematuria with ureter issues/JJ   . Anemia   . HTN (hypertension)   . GLAUCOMA   . CARPAL TUNNEL SYNDROME, RIGHT   . Lymphedema     L>R leg from pelvic XRT/scarring  . Hepatic steatosis     CT scan 06/2009, 12/2009  . DVT (deep venous thrombosis) 10/2010    LLE,  anticoag resumed  . CHF (congestive heart failure)     1 yr ago per pt  . Arthritis   . GERD (gastroesophageal reflux disease)   . Small kidney     left  . Radiation 10/11/11-11/19/11    5040 cGy in 28 fx's/periaortic  . Radiation 03/16/10-03/25/10    right inguinal node/left external iliac node  . Radiation 06/11/08-07/23/08    6000 cGy left pelvis  . Hyperlipidemia   . Leg swelling     left  . Cervical cancer dx 04/2003, recur 04/2008    s/p debulk (TAHBSO), chemo/XRT; recurrent dz L pelvis dx 2010 and liver met 09/2010 s/p RFA  . Osteoporosis with fracture 2014    compression fx, s/p KP ; started prolia 06/2012- narcotic pain mgmt    Past Surgical History  Procedure Laterality Date  . Ureteral stent placement  2005, 01/2010, 07/2010    L hydro related to cervical ca and XRT  . Oophorectomy  2005  . Cardioversion    . Tonsillectomy    . Cholecystectomy  1984  . Total abdominal hysterectomy  2005  . Carpal tunnel  2009    right  . Ivc filter  04/2011    Ebbie Latus RD, LDN

## 2012-11-26 NOTE — Progress Notes (Signed)
[facility] PROGRESS NOTE  [name] OZH:[id number] DOB: January 15, 1951 DOA: 11/23/2012 PCP: [name], MD  Assessment/Plan: 1. Nausea/Vomiting/Diarrhea -likely chemotherapy related -improving slowly -Diarrhea resolved, Cdiff PCR couldn't be sent since no diarrhea -gentle IVF -advance to soft bland diet tonight as tolerated   2. Afib with RVR  -HR, 80-100s peak rates 140 with activity -suspect rate driven in part by above, failed DCCV  -on metoprolol  75mg  BID, change cardizem to CD at 180mg  daily from today -TSH normal, ECHO normal -if poor response to med mgt will ask Cards-EP to eval   3. H/o DVT -continue xarelto  4. Metastatic cervical CA -recent Chemo, continue home regimen of narcotics -Dr.Khan sent her to the ER yesterday,  aware of admission  5. HTN -stable, continue metoprolol   Code Status: Full Family Communication: none at bedside Disposition Plan: home when improved   Consultants:  Dr.Khan aware of admission  HPI/Subjective: Feels ok, still with some nausea this am but improved  Objective: Filed Vitals:   11/26/12 0548  BP: 108/72  Pulse: 91  Temp: 98.2 F (36.8 C)  Resp: 18    Intake/Output Summary (Last 24 hours) at 11/26/12 1300 Last data filed at 11/25/12 1828  Gross per 24 hour  Intake 609.25 ml  Output      0 ml  Net 609.25 ml   Filed Weights   11/24/12 0500 11/25/12 0537 11/26/12 0548  Weight: 74.299 kg (163 lb 12.8 oz) 75.615 kg (166 lb 11.2 oz) 77.747 kg (171 lb 6.4 oz)    Exam:   General: AAox3, no distress  Cardiovascular: S1S2/Irregular rate and rhythm  Respiratory: CTAB  Abdomen: soft, Mild R sided tenderness, no rigidity or rebound, BS present  Musculoskeletal: no edema c/c   Data Reviewed: Basic Metabolic Panel:  Recent Labs Lab 11/23/12 1410 11/24/12 0535 11/26/12 0445  NA 135 138 135  K 3.5 3.2* 3.7  CL 96 98 104  CO2 26 26 23   GLUCOSE 127* 109* 77  BUN 12 11 6   CREATININE 0.77 0.88  0.85  CALCIUM 9.9 9.4 8.5   Liver Function Tests:  Recent Labs Lab 11/23/12 1410 11/24/12 0535  AST 20 17  ALT 19 15  ALKPHOS 224* 214*  BILITOT 0.5 0.4  PROT 7.1 6.8  ALBUMIN 3.2* 3.1*   No results found for this basename: LIPASE, AMYLASE,  in the last 168 hours No results found for this basename: AMMONIA,  in the last 168 hours CBC:  Recent Labs Lab 11/23/12 1410 11/24/12 0535 11/26/12 0445  WBC 9.1 14.1* 9.2  NEUTROABS 7.9*  --   --   HGB 10.7* 10.6* 8.3*  HCT 31.6* 31.8* 24.1*  MCV 82.9 84.1 85.2  PLT 144* 158 122*   Cardiac Enzymes:  Recent Labs Lab 11/23/12 1821 11/24/12 0007 11/24/12 0535  TROPONINI <0.30 <0.30 <0.30   BNP (last 3 results) No results found for this basename: PROBNP,  in the last 8760 hours CBG: No results found for this basename: GLUCAP,  in the last 168 hours  Recent Results (from the past 240 hour(s))  URINE CULTURE     Status: None   Collection Time    11/23/12  2:36 PM      Result Value Range Status   Specimen Description URINE, CLEAN CATCH   Final   Special Requests NONE   Final   Culture  Setup Time     Final   Value: 11/23/2012 21:36     Performed at [facility]   [place] [facility]     Final   Value: NO GROWTH     Performed at [facility]   Culture     Final   Value: NO GROWTH     Performed at [facility]   Report Status 11/24/2012 FINAL   Final     Studies: No results found.  Scheduled Meds: . citalopram  20 mg Oral QPM  . diltiazem  180 mg Oral Daily  . fentaNYL  100 mcg Transdermal Q72H  . fentaNYL  25 mcg Transdermal Q72H  . ferrous fumarate  1 tablet Oral Daily  . folic acid  1 mg Oral Daily  . loratadine  10 mg Oral Daily  . metoprolol tartrate  75 mg Oral BID  . ondansetron  8 mg Intravenous Q8H  . pantoprazole (PROTONIX) IV  40 mg Intravenous Daily  . Rivaroxaban  20 mg Oral Q supper  . sodium chloride  3 mL Intravenous Q12H   Continuous Infusions: . sodium chloride 0.9 %  1,000 mL with potassium chloride 40 mEq infusion 75 mL/hr at 11/26/12 1153    Principal Problem:   Atrial fibrillation Active Problems:   DEPRESSION   MIGRAINE HEADACHE   HYPERTENSION   Cervical cancer   DVT (deep venous thrombosis)   Anemia of chronic disease   Thrombocytopenia   Nausea vomiting and diarrhea   Dehydration   GERD (gastroesophageal reflux disease)    Time spent:    [facility]  [facility] Pager [phone]. If 7PM-7AM, please contact night-coverage at [email or web address], password [facility] 11/26/2012, 1:00 PM  LOS: 3 days

## 2012-11-27 NOTE — Progress Notes (Signed)
Physical Therapy Treatment Patient Details Name: [name] MRN: [id number] DOB: 06/07/1950 Today's Date: 11/27/2012 Time: 4098-1191 PT Time Calculation (min): 11 min  PT Assessment / Plan / Recommendation  History of Present Illness 62 y.o. female   history of metastatic cervical carcinoma with metastases to the liver, prior history of radiation and concurrent chemotherapy, currently on chemotherapy with last therapy one week prior to admission, atrial fibrillation on chronic anticoagulation, hypertension, history of DVT, gastroesophageal reflux disease, who presents to the ED with a 2 to three-day history of nausea and intractable emesis, one-day history of diarrhea, one week history of generalized weakness and not feeling well. Patient does endorse some chills, low-grade fever, chronic right upper abdominal pain, some dysuria. Patient also complained of significant heartburn   PT Comments   Pt has met max level of mobility for this setting and demon controlled HR this last session.    Follow Up Recommendations  No PT follow up     Does the patient have the potential to tolerate intense rehabilitation     Barriers to Discharge        Equipment Recommendations       Recommendations for Other Services    Frequency Min 2X/week   Progress towards PT Goals Progress towards PT goals: Progressing toward goals  Plan      Precautions / Restrictions Precautions Precautions: None Precaution Comments: monitor HR Restrictions Weight Bearing Restrictions: No    Pertinent Vitals/Pain No c/o pain    Mobility  Bed Mobility Bed Mobility: Not assessed Details for Bed Mobility Assistance: Pt sitting EOB on arrival Transfers Transfers: Sit to Stand;Stand to Sit Sit to Stand: 6: Modified independent (Device/Increase time);From bed Stand to Sit: 6: Modified independent (Device/Increase time);To bed Details for Transfer Assistance: good safety tech Ambulation/Gait Ambulation/Gait  Assistance: 6: Modified independent (Device/Increase time) Ambulation Distance (Feet): 500 Feet Assistive device: None Ambulation/Gait Assistance Details: HR avg 117.  Good alternating gait. No LOB Gait Pattern: Within Functional Limits     PT Goals (current goals can now be found in the care plan section)    Visit Information  Last PT Received On: 11/27/12 Assistance Needed: +1 History of Present Illness: 62 y.o. female   history of metastatic cervical carcinoma with metastases to the liver, prior history of radiation and concurrent chemotherapy, currently on chemotherapy with last therapy one week prior to admission, atrial fibrillation on chronic anticoagulation, hypertension, history of DVT, gastroesophageal reflux disease, who presents to the ED with a 2 to three-day history of nausea and intractable emesis, one-day history of diarrhea, one week history of generalized weakness and not feeling well. Patient does endorse some chills, low-grade fever, chronic right upper abdominal pain, some dysuria. Patient also complained of significant heartburn    Subjective Data      Cognition       Balance     End of Session PT - End of Session Activity Tolerance: Patient tolerated treatment well Patient left: in bed;with call bell/phone within reach;with family/visitor present (EOB)   Felecia Shelling  PTA [facility]  Acute  Rehab Pager      [phone]

## 2012-11-27 NOTE — Progress Notes (Signed)
[facility] PROGRESS NOTE  RIM THATCH WUJ:[id number] DOB: 1950-07-12 DOA: 11/23/2012 PCP: [name], MD  Assessment/Plan: 1. Nausea/Vomiting/Diarrhea -likely chemotherapy related -improving slowly -Diarrhea resolved, Cdiff PCR couldn't be sent since no diarrhea -gentle IVF -advanced to soft bland diet   2. Afib with RVR  -HR, 70-84,  peak rates 100 with activity -suspect rate driven in part by above, failed DCCV  -on metoprolol  75mg  BID, change cardizem to CD at 180mg  daily from today -TSH normal, ECHO normal  3. H/o DVT -continue xarelto  4. Metastatic cervical CA -recent Chemo, continue home regimen of narcotics -Dr.Khan sent her to the ER yesterday,  aware of admission  5. HTN -stable, continue metoprolol   Code Status: Full Family Communication: none at bedside Disposition Plan: home tomorrow   Consultants:  Dr.Khan aware of admission  HPI/Subjective: Feels better, appetite and po intake improving  Objective: Filed Vitals:   11/27/12 1224  BP: 115/84  Pulse:   Temp:   Resp:     Intake/Output Summary (Last 24 hours) at 11/27/12 1437 Last data filed at 11/26/12 2100  Gross per 24 hour  Intake 1948.75 ml  Output      0 ml  Net 1948.75 ml   Filed Weights   11/25/12 0537 11/26/12 0548 11/27/12 0502  Weight: 75.615 kg (166 lb 11.2 oz) 77.747 kg (171 lb 6.4 oz) 78.9 kg (173 lb 15.1 oz)    Exam:   General: AAox3, no distress  Cardiovascular: S1S2/Irregular rate and rhythm  Respiratory: CTAB  Abdomen: soft, non tender, no rigidity or rebound, BS present  Musculoskeletal: no edema c/c   Data Reviewed: Basic Metabolic Panel:  Recent Labs Lab 11/23/12 1410 11/24/12 0535 11/26/12 0445  NA 135 138 135  K 3.5 3.2* 3.7  CL 96 98 104  CO2 26 26 23   GLUCOSE 127* 109* 77  BUN 12 11 6   CREATININE 0.77 0.88 0.85  CALCIUM 9.9 9.4 8.5   Liver Function Tests:  Recent Labs Lab 11/23/12 1410 11/24/12 0535  AST 20 17  ALT 19  15  ALKPHOS 224* 214*  BILITOT 0.5 0.4  PROT 7.1 6.8  ALBUMIN 3.2* 3.1*   No results found for this basename: LIPASE, AMYLASE,  in the last 168 hours No results found for this basename: AMMONIA,  in the last 168 hours CBC:  Recent Labs Lab 11/23/12 1410 11/24/12 0535 11/26/12 0445  WBC 9.1 14.1* 9.2  NEUTROABS 7.9*  --   --   HGB 10.7* 10.6* 8.3*  HCT 31.6* 31.8* 24.1*  MCV 82.9 84.1 85.2  PLT 144* 158 122*   Cardiac Enzymes:  Recent Labs Lab 11/23/12 1821 11/24/12 0007 11/24/12 0535  TROPONINI <0.30 <0.30 <0.30   BNP (last 3 results) No results found for this basename: PROBNP,  in the last 8760 hours CBG: No results found for this basename: GLUCAP,  in the last 168 hours  Recent Results (from the past 240 hour(s))  URINE CULTURE     Status: None   Collection Time    11/23/12  2:36 PM      Result Value Range Status   Specimen Description URINE, CLEAN CATCH   Final   Special Requests NONE   Final   Culture  Setup Time     Final   Value: 11/23/2012 21:36     Performed at [employer] [facility] Count     Final   Value: NO GROWTH     Performed at [facility]   Culture     Final   Value: NO GROWTH     Performed at [facility]   Report Status 11/24/2012 FINAL   Final     Studies: No results found.  Scheduled Meds: . citalopram  20 mg Oral QPM  . diltiazem  180 mg Oral Daily  . fentaNYL  100 mcg Transdermal Q72H  . fentaNYL  25 mcg Transdermal Q72H  . ferrous fumarate  1 tablet Oral Daily  . folic acid  1 mg Oral Daily  . loratadine  10 mg Oral Daily  . metoprolol tartrate  75 mg Oral BID  . ondansetron  8 mg Intravenous Q8H  . pantoprazole (PROTONIX) IV  40 mg Intravenous Daily  . protein supplement  2 oz Oral QID  . Rivaroxaban  20 mg Oral Q supper  . sodium chloride  3 mL Intravenous Q12H   Continuous Infusions: . sodium chloride 0.9 % 1,000 mL with potassium chloride 40 mEq infusion 75 mL/hr at 11/27/12 0053     Principal Problem:   Atrial fibrillation Active Problems:   DEPRESSION   MIGRAINE HEADACHE   HYPERTENSION   Cervical cancer   DVT (deep venous thrombosis)   Anemia of chronic disease   Thrombocytopenia   Nausea vomiting and diarrhea   Dehydration   GERD (gastroesophageal reflux disease)    Time spent:    [facility]  [facility] Pager [phone]. If 7PM-7AM, please contact night-coverage at [email or web address], password [facility] 11/27/2012, 2:37 PM  LOS: 4 days

## 2012-11-28 DIAGNOSIS — I4891 Unspecified atrial fibrillation: Secondary | ICD-10-CM | POA: Diagnosis present

## 2012-11-28 MED ORDER — DILTIAZEM HCL ER COATED BEADS 180 MG PO CP24: 180.0000 mg | ORAL_CAPSULE | Freq: Every day | ORAL | Status: DC

## 2012-11-28 MED ORDER — HEPARIN SOD (PORK) LOCK FLUSH 100 UNIT/ML IV SOLN
500.0000 [IU] | INTRAVENOUS | Status: AC | PRN
  Administered 2012-11-28: 11:00:00 500 [IU]

## 2012-11-28 MED ORDER — METOPROLOL TARTRATE 25 MG PO TABS: 75.0000 mg | ORAL_TABLET | Freq: Two times a day (BID) | ORAL | Status: DC

## 2012-11-28 NOTE — Discharge Summary (Signed)
Physician Discharge Summary  [name] WUJ:[id number] DOB: 1950/09/17 DOA: 11/23/2012  PCP: [name], MD  Admit date: 11/23/2012 Discharge date: 11/28/2012  Time spent: 45 minutes  Recommendations for Outpatient Follow-up:  1. Dr. Welton Flakes in 1 week  Discharge Diagnoses:  Principal Problem:   Atrial fibrillation with RVR   Nausea and vomiting-chemotherapy induced   DEPRESSION   MIGRAINE HEADACHE   HYPERTENSION   Cervical cancer   DVT (deep venous thrombosis)   Anemia of chronic disease   Thrombocytopenia   Nausea vomiting and diarrhea   Dehydration   GERD (gastroesophageal reflux disease)    Discharge Condition:stable  Diet recommendation: soft, bland diet  Filed Weights   11/26/12 0548 11/27/12 0502 11/28/12 0503  Weight: 77.747 kg (171 lb 6.4 oz) 78.9 kg (173 lb 15.1 oz) 78.518 kg (173 lb 1.6 oz)    History of present illness:  HPI: [name] is a 62 y.o. female  With history of metastatic cervical carcinoma with metastases to the liver, prior history of radiation and concurrent chemotherapy, currently on chemotherapy with last therapy one week prior to admission, atrial fibrillation on chronic anticoagulation, hypertension, history of DVT, gastroesophageal reflux disease, who presents to the ED with a 2 to three-day history of nausea and intractable emesis, one-day history of diarrhea, one week history of generalized weakness and not feeling well. Patient does endorse some chills, low-grade fever, chronic right upper abdominal pain, some dysuria. Patient also complained of significant heartburn. Patient denied any chest pain, no shortness of breath, no cough, no hematemesis, no hematochezia, no melena, no constipation.  Patient stated she received chemotherapy one week prior to admission and then one to 2 days after that was receiving IV fluids at a cancer center.  Patient had presented to the cancer Center today EKG done there was A. fib with RVR she was  subsequently sent to the ED.  In the ED comprehensive metabolic profile obtained at our phosphatase of 224 and albumin of 3.2 otherwise was within normal limits. CBC had a hemoglobin of 10.7 and platelet count of 144. Acute abdominal series which was done was unremarkable. Patient was given some IV fluids, also given some Zofran and Phenergan and was still vomiting. Patient was subsequently given some Reglan. We were called to admit the patient.   Hospital Course:  1. Nausea/Vomiting/Diarrhea -likely chemotherapy related  -improving slowly  -Diarrhea resolved, Cdiff PCR couldn't be sent since no diarrhea  -treated with supportive care, IVF, anti-emetics  -advanced diet to soft bland diet which she has been tolerating since yesterday  2. Afib with RVR  -initially resting HR of 120s with rates of 170 with activity -suspect rate driven in part by above, failed DCCV  -her  metoprolol  Dose was increased to 75mg  BID, then started on cardizem q6 and then transitioned to cardizem CD at 180mg  daily -TSH normal, ECHO normal  -at discharge, rate controlled  3. H/o DVT  -continue xarelto   4. Metastatic cervical CA  -recent Chemo, continue home regimen of narcotics  -Dr.Khan sent her to the ER  hence aware of admission   5. HTN  -stable, continue metoprolol    Discharge Exam: Filed Vitals:   11/28/12 0957  BP: 116/68  Pulse:   Temp:   Resp:     General: AAOx3 Cardiovascular: S1S2/RRR Respiratory: CTAB  Discharge Instructions  Discharge Orders   Future Appointments Provider Department Dept Phone   12/07/2012 9:45 AM Delcie Roch [facility] MEDICAL  ONCOLOGY [phone]   12/07/2012 10:15 AM Victorino December, MD [facility] MEDICAL ONCOLOGY [phone]   12/07/2012 11:15 AM [facility] [phone]   12/08/2012 9:30 AM [facility] Inj Nurse [place] [facility] ONCOL[facility] [phone]    12/14/2012 11:15 AM Victorino December, MD [facility] MEDICAL ONCOLOGY [phone]   12/28/2012 8:30 AM Delcie Roch [facility] [phone]   12/28/2012 9:00 AM Victorino December, MD [facility] MEDICAL ONCOLOGY [phone]   12/28/2012 10:00 AM [facility] [place] [facility] [phone]   12/29/2012 9:00 AM [facility] [phone]   Future Orders Complete By Expires   Discharge instructions  As directed    Comments:     Parke Simmers diet advance as tolerated   Increase activity slowly  As directed        Medication List         bisacodyl 5 MG EC tablet  Commonly known as:  DULCOLAX  Take 5 mg by mouth daily as needed for constipation.     CARNATION INSTANT BREAKFAST PO  Take 1 Can by mouth 4 (four) times daily as needed (for meals).     cetirizine 10 MG tablet  Commonly known as:  ZYRTEC  Take 10 mg by mouth every evening.     citalopram 20 MG tablet  Commonly known as:  CELEXA  Take 20 mg by mouth every evening.     diltiazem 180 MG 24 hr capsule  Commonly known as:  CARDIZEM CD  Take 1 capsule (180 mg total) by mouth daily. TAke 2-3 hours before of after Metoprolol     fentaNYL 25 MCG/HR patch  Commonly known as:  DURAGESIC - dosed mcg/hr  Place 1 patch (25 mcg total) onto the skin every 3 (three) days. Total dose of 125 mcg     fentaNYL 100 MCG/HR  Commonly known as:  DURAGESIC - dosed mcg/hr  Place 1 patch (100 mcg total) onto the skin every 3 (three) days. Total dose 125mg      ferrous fumarate 325 (106 FE) MG Tabs tablet  Commonly known as:  HEMOCYTE - 106 mg FE  Take 1 tablet (106 mg of iron total) by mouth daily.     folic acid 1 MG tablet  Commonly known as:  FOLVITE  Take 1 mg by mouth every morning.     HYDROmorphone 4 MG tablet  Commonly known as:  DILAUDID  Take 1 tablet every 4 hours as needed for pain      ibuprofen 200 MG tablet  Commonly known as:  ADVIL,MOTRIN  Take 600 mg by mouth 3 (three) times daily as needed for pain. Pain     lidocaine-prilocaine cream  Commonly known as:  EMLA  Apply 1 application topically daily as needed (for port access).     LORazepam 1 MG tablet  Commonly known as:  ATIVAN  Take 1 tablet (1 mg total) by mouth every 6 (six) hours as needed (for nausea (may put under tongue)).     metoprolol tartrate 25 MG tablet  Commonly known as:  LOPRESSOR  Take 3 tablets (75 mg total) by mouth 2 (two) times daily.     omeprazole 20 MG capsule  Commonly known as:  PRILOSEC  Take 20 mg by mouth every morning.     ondansetron 8 MG tablet  Commonly known as:  ZOFRAN  Take 1 tablet (8 mg total) by mouth every 8 (eight) hours as needed for nausea.     polyethylene glycol packet  Commonly known as:  MIRALAX / GLYCOLAX  Take 17 g by mouth 2 (two) times daily as needed (constipation).     PRESCRIPTION MEDICATION  - Inject into the vein every 21 ( twenty-one) days. PEMEtrexed (ALIMTA) 950 mg in sodium chloride 0.9 % 100 mL chemo infusion 500 mg/m2  1.91 m2 (Treatment Plan Actual)  - md Welton Flakes     protein supplement Powd  Commonly known as:  UNJURY CHICKEN SOUP  Take 2 oz by mouth 4 (four) times daily as needed (for meals).     Rivaroxaban 20 MG Tabs tablet  Commonly known as:  XARELTO  Take 1 tablet (20 mg total) by mouth every evening.     sodium chloride 0.65 % nasal spray  Commonly known as:  OCEAN  Place 1 spray into the nose daily as needed for congestion. Allergies     triamcinolone 55 MCG/ACT nasal inhaler  Commonly known as:  NASACORT  Place 2 sprays into the nose daily as needed (allergies or congestion).     zolpidem 5 MG tablet  Commonly known as:  AMBIEN  Take 1 tablet (5 mg total) by mouth at bedtime as needed for sleep.       Allergies  Allergen Reactions  . Codeine Rash and Other (See Comments)    Breathing issues  . Flecainide Acetate  Other (See Comments)    Doesn't remember reaction  . Morphine Other (See Comments)     Hallucinations  . Propafenone Hcl Other (See Comments)    Leg cramps       Follow-up Information   Follow up with Drue Second, MD. Schedule an appointment as soon as possible for a visit in 1 week.   Specialty:  Oncology   Contact information:   [address] [place] [address] [phone]        The results of significant diagnostics from this hospitalization (including imaging, microbiology, ancillary and laboratory) are listed below for reference.    Significant Diagnostic Studies: Dg Abd Acute W/chest  11/23/2012   CLINICAL DATA:  Right upper quadrant pain, nausea, abdominal pain  EXAM: ACUTE ABDOMEN SERIES (ABDOMEN 2 VIEW & CHEST 1 VIEW)  COMPARISON:  07/29/2012  FINDINGS: Borderline cardiomegaly again noted. Right IJ Port-A-Cath is unchanged in position. Stable linear scarring in left midlung. No acute infiltrate or pulmonary edema.  There is nonspecific nonobstructive bowel gas pattern. Prior vertebroplasty lumbar spine. IVC filter in place. Surgical clips are noted in right upper quadrant.  IMPRESSION: Negative abdominal radiographs.  No acute cardiopulmonary disease.   Electronically Signed   By: Natasha Mead   On: 11/23/2012 15:53    Microbiology: Recent Results (from the past 240 hour(s))  URINE CULTURE     Status: None   Collection Time    11/23/12  2:36 PM      Result Value Range Status   Specimen Description URINE, CLEAN CATCH   Final   Special Requests NONE   Final   Culture  Setup Time     Final   Value: 11/23/2012 21:36     Performed at [facility] Count     Final   Value: NO GROWTH     Performed at [facility]   Culture     Final   Value: NO GROWTH  Performed at [facility]   Report Status 11/24/2012 FINAL   Final     Labs: Basic Metabolic Panel:  Recent Labs Lab 11/23/12 1410 11/24/12 0535 11/26/12 0445  NA  135 138 135  K 3.5 3.2* 3.7  CL 96 98 104  CO2 26 26 23   GLUCOSE 127* 109* 77  BUN 12 11 6   CREATININE 0.77 0.88 0.85  CALCIUM 9.9 9.4 8.5   Liver Function Tests:  Recent Labs Lab 11/23/12 1410 11/24/12 0535  AST 20 17  ALT 19 15  ALKPHOS 224* 214*  BILITOT 0.5 0.4  PROT 7.1 6.8  ALBUMIN 3.2* 3.1*   No results found for this basename: LIPASE, AMYLASE,  in the last 168 hours No results found for this basename: AMMONIA,  in the last 168 hours CBC:  Recent Labs Lab 11/23/12 1410 11/24/12 0535 11/26/12 0445  WBC 9.1 14.1* 9.2  NEUTROABS 7.9*  --   --   HGB 10.7* 10.6* 8.3*  HCT 31.6* 31.8* 24.1*  MCV 82.9 84.1 85.2  PLT 144* 158 122*   Cardiac Enzymes:  Recent Labs Lab 11/23/12 1821 11/24/12 0007 11/24/12 0535  TROPONINI <0.30 <0.30 <0.30   BNP: BNP (last 3 results) No results found for this basename: PROBNP,  in the last 8760 hours CBG: No results found for this basename: GLUCAP,  in the last 168 hours     Signed:  Carnelia Oscar  [facility] 11/28/2012, 12:01 PM

## 2012-12-01 ENCOUNTER — Telehealth: Payer: Self-pay | Admitting: *Deleted

## 2012-12-01 ENCOUNTER — Telehealth: Payer: Self-pay | Admitting: Oncology

## 2012-12-01 NOTE — Telephone Encounter (Signed)
Per staff phone call I have adjusted 10/9 appt

## 2012-12-05 NOTE — Progress Notes (Signed)
Patient was seen in followup today. She has had a very difficult few days. She has been very nauseous vomiting she has not had any fevers. During her visit with me patient had perfect views vomiting. We therefore recommended giving her IV fluids. We also got an EKG performed. The EKG was abnormal. Do to diaphoresis pain and vomiting and an abnormal EKG patient was sent to the emergency room for further evaluation.  Drue Second, MD Medical/Oncology [facility] [phone] (beeper) [phone] (Office)

## 2012-12-07 ENCOUNTER — Ambulatory Visit: Payer: 59 | Admitting: Oncology

## 2012-12-07 ENCOUNTER — Encounter: Payer: Self-pay | Admitting: Adult Health

## 2012-12-07 ENCOUNTER — Ambulatory Visit (HOSPITAL_BASED_OUTPATIENT_CLINIC_OR_DEPARTMENT_OTHER): Payer: 59

## 2012-12-07 ENCOUNTER — Other Ambulatory Visit (HOSPITAL_BASED_OUTPATIENT_CLINIC_OR_DEPARTMENT_OTHER): Payer: 59 | Admitting: Lab

## 2012-12-07 ENCOUNTER — Telehealth: Payer: Self-pay | Admitting: *Deleted

## 2012-12-07 ENCOUNTER — Other Ambulatory Visit: Payer: 59 | Admitting: Lab

## 2012-12-07 ENCOUNTER — Ambulatory Visit (HOSPITAL_BASED_OUTPATIENT_CLINIC_OR_DEPARTMENT_OTHER): Payer: 59 | Admitting: Adult Health

## 2012-12-07 VITALS — BP 94/63 | HR 99 | Temp 98.6°F | Resp 20 | Ht 61.0 in | Wt 174.7 lb

## 2012-12-07 DIAGNOSIS — C539 Malignant neoplasm of cervix uteri, unspecified: Secondary | ICD-10-CM

## 2012-12-07 DIAGNOSIS — I82409 Acute embolism and thrombosis of unspecified deep veins of unspecified lower extremity: Secondary | ICD-10-CM

## 2012-12-07 DIAGNOSIS — R630 Anorexia: Secondary | ICD-10-CM

## 2012-12-07 DIAGNOSIS — Z5111 Encounter for antineoplastic chemotherapy: Secondary | ICD-10-CM

## 2012-12-07 DIAGNOSIS — C770 Secondary and unspecified malignant neoplasm of lymph nodes of head, face and neck: Secondary | ICD-10-CM

## 2012-12-07 DIAGNOSIS — C787 Secondary malignant neoplasm of liver and intrahepatic bile duct: Secondary | ICD-10-CM

## 2012-12-07 DIAGNOSIS — F341 Dysthymic disorder: Secondary | ICD-10-CM

## 2012-12-07 DIAGNOSIS — E860 Dehydration: Secondary | ICD-10-CM

## 2012-12-07 LAB — COMPREHENSIVE METABOLIC PANEL (CC13)
Alkaline Phosphatase: 210 U/L — ABNORMAL HIGH (ref 40–150)
BUN: 11.8 mg/dL (ref 7.0–26.0)
Calcium: 9.3 mg/dL (ref 8.4–10.4)
Chloride: 102 mEq/L (ref 98–109)
Creatinine: 0.8 mg/dL (ref 0.6–1.1)
Glucose: 97 mg/dl (ref 70–140)
Sodium: 133 mEq/L — ABNORMAL LOW (ref 136–145)
Total Bilirubin: 0.39 mg/dL (ref 0.20–1.20)
Total Protein: 6.5 g/dL (ref 6.4–8.3)

## 2012-12-07 LAB — CBC WITH DIFFERENTIAL/PLATELET
Basophils Absolute: 0 10*3/uL (ref 0.0–0.1)
EOS%: 0 % (ref 0.0–7.0)
Eosinophils Absolute: 0 10*3/uL (ref 0.0–0.5)
HGB: 9.2 g/dL — ABNORMAL LOW (ref 11.6–15.9)
LYMPH%: 10.9 % — ABNORMAL LOW (ref 14.0–49.7)
MCH: 28.1 pg (ref 25.1–34.0)
MCV: 87.8 fL (ref 79.5–101.0)
MONO%: 11.4 % (ref 0.0–14.0)
NEUT#: 3.2 10*3/uL (ref 1.5–6.5)
NEUT%: 77.5 % — ABNORMAL HIGH (ref 38.4–76.8)
Platelets: 270 10*3/uL (ref 145–400)
RBC: 3.27 10*6/uL — ABNORMAL LOW (ref 3.70–5.45)
RDW: 18.8 % — ABNORMAL HIGH (ref 11.2–14.5)
nRBC: 0 % (ref 0–0)

## 2012-12-07 MED ORDER — SODIUM CHLORIDE 0.9 % IV SOLN
Freq: Once | INTRAVENOUS | Status: AC
  Administered 2012-12-07: 13:00:00 via INTRAVENOUS

## 2012-12-07 MED ORDER — HEPARIN SOD (PORK) LOCK FLUSH 100 UNIT/ML IV SOLN
500.0000 [IU] | Freq: Once | INTRAVENOUS | Status: AC | PRN
  Administered 2012-12-07: 500 [IU]
  Filled 2012-12-07: qty 5

## 2012-12-07 MED ORDER — SODIUM CHLORIDE 0.9 % IJ SOLN
10.0000 mL | INTRAMUSCULAR | Status: DC | PRN
  Administered 2012-12-07: 10 mL
  Filled 2012-12-07: qty 10

## 2012-12-07 MED ORDER — SODIUM CHLORIDE 0.9 % IV SOLN: Freq: Once | INTRAVENOUS | Status: DC

## 2012-12-07 MED ORDER — SODIUM CHLORIDE 0.9 % IV SOLN
500.0000 mg/m2 | Freq: Once | INTRAVENOUS | Status: AC
  Administered 2012-12-07: 950 mg via INTRAVENOUS
  Filled 2012-12-07: qty 38

## 2012-12-07 MED ORDER — DEXAMETHASONE SODIUM PHOSPHATE 10 MG/ML IJ SOLN
10.0000 mg | Freq: Once | INTRAMUSCULAR | Status: AC
  Administered 2012-12-07: 10 mg via INTRAVENOUS

## 2012-12-07 MED ORDER — ONDANSETRON 8 MG/NS 50 ML IVPB
INTRAVENOUS | Status: AC
  Filled 2012-12-07: qty 8

## 2012-12-07 MED ORDER — DEXAMETHASONE SODIUM PHOSPHATE 10 MG/ML IJ SOLN
INTRAMUSCULAR | Status: AC
  Filled 2012-12-07: qty 1

## 2012-12-07 MED ORDER — ONDANSETRON 8 MG/50ML IVPB (CHCC)
8.0000 mg | Freq: Once | INTRAVENOUS | Status: AC
  Administered 2012-12-07: 8 mg via INTRAVENOUS

## 2012-12-07 NOTE — Telephone Encounter (Signed)
appts made and printed.pt is aware that tx will be added. i emailed MW to add the tx.....td 

## 2012-12-07 NOTE — Progress Notes (Signed)
OFFICE PROGRESS NOTE  CC**  [name] Paci, MD [address] [facility] [address] [place] [address] Dr. Loree Fee  DIAGNOSIS: 62 year old female with metastatic cervical carcinoma  PRIOR THERAPY:  #1 patient underwent a radical hysterectomy bilateral salpingo-oophorectomy and pelvic lymphadenectomy in March 2005 for his stage IB cervical cancer. Her final pathology showed no residual disease in all lymph nodes were negative.  #2 patient was followed for 5 years with no evidence of recurrent disease until she developed a recurrence of the right pelvic sidewall in [place] 2010. This caused obstruction of the right ureter. She was treated with radiation therapy and concurrent cis-platinum chemotherapy.  #3 in late 2011 patient was found to have recurrence of the right inguinal lymph node and left pelvic nodes and received additional radiation therapy to those areas. His radiation was completed in January 2012. In April 2012 the patient had resolution of the right inguinal nodes. In August 2012 the patient was found to have a liver lesion which sure and 2.8 cm and received 6 cycles of Taxol. Patient had partial response to chemotherapy but the liver lesion began to increase to a size of 1.9. Reimaging showed that there were no other lesions except the liver and patient elected to a plate the liver lesion using RFA he performed a day 2013. The procedure went well but patient developed an extensive DVT in the left lower extremity and was placed on Lovenox and IVC filter  #4 patient subsequent treatments include of radiation therapy in June and August and September 2013. November 2013 she was found to have metastasis and small left inguinal lymph nodes. She resumed chemotherapy using low dose carboplatinum and Taxol between December and January 2014. This was complicated by neutropenia. Patient had CT scans in February 2014 that showed increase in size of the liver metastasis.   #5  Patient was started on Alimta in May 2014, she had a difficult time with this first cycle of chemotherapy and did not receive it again until she followed up with Dr. Stanford Breed in gyn/oncology had progressive disease and conveyed her desire to restart it.  She was then referred to Dr. Welton Flakes to restart Alimta therapy and resumed it on 10/26/12.   CURRENT THERAPY: cycle 4 day 1 Alimta  INTERVAL HISTORY: [name] 62 y.o. female returns for evaluation prior to Alimta.  She's doing well.  She is fatigued, and has had low grade fevers, she has mild nausea and vomiting, she takes Zofran or Ativan for this and it helps.  She was recently hospitalized for Afib with RVR, she states that this has been an ongoing intermittent issue for 10 years.  She is now rate controlled and feeling much better.  She denies numbness.  Otherwise, a 10 point ROS is negative.    MEDICAL HISTORY: Past Medical History  Diagnosis Date  . Diastolic dysfunction   . MIGRAINE HEADACHE   . Atrial fibrillation     failed DCCV, CHADs2-prev pradaxa stopped due to hematuria with ureter issues/JJ   . Anemia   . HTN (hypertension)   . GLAUCOMA   . CARPAL TUNNEL SYNDROME, RIGHT   . Lymphedema     L>R leg from pelvic XRT/scarring  . Hepatic steatosis     CT scan 06/2009, 12/2009  . DVT (deep venous thrombosis) 10/2010    LLE, anticoag resumed  . CHF (congestive heart failure)     1 yr ago per pt  . Arthritis   .  GERD (gastroesophageal reflux disease)   . Small kidney     left  . Radiation 10/11/11-11/19/11    5040 cGy in 28 fx's/periaortic  . Radiation 03/16/10-03/25/10    right inguinal node/left external iliac node  . Radiation 06/11/08-07/23/08    6000 cGy left pelvis  . Hyperlipidemia   . Leg swelling     left  . Cervical cancer dx 04/2003, recur 04/2008    s/p debulk (TAHBSO), chemo/XRT; recurrent dz L pelvis dx 2010 and liver met 09/2010 s/p RFA  . Osteoporosis with fracture 2014    compression fx, s/p KP ; started  prolia 06/2012- narcotic pain mgmt    ALLERGIES:  is allergic to codeine; flecainide acetate; morphine; and propafenone hcl.  MEDICATIONS:  Current Outpatient Prescriptions  Medication Sig Dispense Refill  . bisacodyl (DULCOLAX) 5 MG EC tablet Take 5 mg by mouth daily as needed for constipation.      . cetirizine (ZYRTEC) 10 MG tablet Take 10 mg by mouth every evening.       . citalopram (CELEXA) 20 MG tablet Take 20 mg by mouth every evening.       . diltiazem (CARDIZEM CD) 180 MG 24 hr capsule Take 1 capsule (180 mg total) by mouth daily. TAke 2-3 hours before of after Metoprolol  30 capsule  0  . fentaNYL (DURAGESIC - DOSED MCG/HR) 100 MCG/HR Place 1 patch (100 mcg total) onto the skin every 3 (three) days. Total dose 125mg   10 patch  0  . fentaNYL (DURAGESIC - DOSED MCG/HR) 25 MCG/HR patch Place 1 patch (25 mcg total) onto the skin every 3 (three) days. Total dose of 125 mcg  10 patch  0  . ferrous fumarate (HEMOCYTE - 106 MG FE) 325 (106 FE) MG TABS Take 1 tablet (106 mg of iron total) by mouth daily.  30 each  0  . folic acid (FOLVITE) 1 MG tablet Take 1 mg by mouth every morning.      [name] HYDROmorphone (DILAUDID) 4 MG tablet Take 1 tablet every 4 hours as needed for pain  120 tablet  0  . ibuprofen (ADVIL,MOTRIN) 200 MG tablet Take 600 mg by mouth 3 (three) times daily as needed for pain. Pain      . lidocaine-prilocaine (EMLA) cream Apply 1 application topically daily as needed (for port access).      . LORazepam (ATIVAN) 1 MG tablet Take 1 tablet (1 mg total) by mouth every 6 (six) hours as needed (for nausea (may put under tongue)).  30 tablet  2  . metoprolol (LOPRESSOR) 25 MG tablet Take 3 tablets (75 mg total) by mouth 2 (two) times daily.  60 tablet  0  . Nutritional Supplements (CARNATION INSTANT BREAKFAST PO) Take 1 Can by mouth 4 (four) times daily as needed (for meals).      [name] omeprazole (PRILOSEC) 20 MG capsule Take 20 mg by mouth every morning.       . ondansetron (ZOFRAN) 8  MG tablet Take 1 tablet (8 mg total) by mouth every 8 (eight) hours as needed for nausea.  30 tablet  2  . polyethylene glycol (MIRALAX / GLYCOLAX) packet Take 17 g by mouth 2 (two) times daily as needed (constipation).       [name] PRESCRIPTION MEDICATION Inject into the vein every 21 ( twenty-one) days. PEMEtrexed (ALIMTA) 950 mg in sodium chloride 0.9 % 100 mL chemo infusion 500 mg/m2  1.91 m2 (Treatment Plan Actual) md Welton Flakes      .  protein supplement (UNJURY CHICKEN SOUP) POWD Take 2 oz by mouth 4 (four) times daily as needed (for meals).      . Rivaroxaban (XARELTO) 20 MG TABS Take 1 tablet (20 mg total) by mouth every evening.  30 tablet  5  . sodium chloride (OCEAN) 0.65 % nasal spray Place 1 spray into the nose daily as needed for congestion. Allergies      . triamcinolone (NASACORT) 55 MCG/ACT nasal inhaler Place 2 sprays into the nose daily as needed (allergies or congestion).       [name] zolpidem (AMBIEN) 5 MG tablet Take 1 tablet (5 mg total) by mouth at bedtime as needed for sleep.  20 tablet  0   No current facility-administered medications for this visit.   Facility-Administered Medications Ordered in Other Visits  Medication Dose Route Frequency Provider Last Rate Last Dose  . 0.9 %  sodium chloride infusion   Intravenous Continuous Victorino December, MD 500 mL/hr at 12/08/12 1215      SURGICAL HISTORY:  Past Surgical History  Procedure Laterality Date  . Ureteral stent placement  2005, 01/2010, 07/2010    L hydro related to cervical ca and XRT  . Oophorectomy  2005  . Cardioversion    . Tonsillectomy    . Cholecystectomy  1984  . Total abdominal hysterectomy  2005  . Carpal tunnel  2009    right  . Ivc filter  04/2011    REVIEW OF SYSTEMS:  A 10 point review of systems was conducted and is otherwise negative except for what is noted above.    PHYSICAL EXAMINATION: Blood pressure 94/63, pulse 99, temperature 98.6 F (37 C), temperature source Oral, resp. rate 20, height 5\' 1"   (1.549 m), weight 174 lb 11.2 oz (79.243 kg). Body mass index is 33.03 kg/(m^2). General: Patient is a well appearing female in no acute distress HEENT: PERRLA, sclerae anicteric no conjunctival pallor, MMM Neck: supple, no palpable adenopathy Lungs: clear to auscultation bilaterally, no wheezes, rhonchi, or rales Cardiovascular: regular rate rhythm, S1, S2, no murmurs, rubs or gallops Abdomen: Soft, non-tender, non-distended, normoactive bowel sounds, no HSM Extremities: warm and well perfused, no clubbing, cyanosis, increased edema in left lower extremity, with known h/o DVT in that leg.   Skin: No rashes or lesions Neuro: Non-focal ECOG PERFORMANCE STATUS: 0 - Asymptomatic   LABORATORY DATA: Lab Results  Component Value Date   WBC 4.1 12/07/2012   HGB 9.2* 12/07/2012   HCT 28.7* 12/07/2012   MCV 87.8 12/07/2012   PLT 270 12/07/2012      Chemistry      Component Value Date/Time   NA 133* 12/07/2012 1157   NA 135 11/26/2012 0445   K 4.6 12/07/2012 1157   K 3.7 11/26/2012 0445   CL 104 11/26/2012 0445   CL 108* 08/15/2012 1252   CO2 26 12/07/2012 1157   CO2 23 11/26/2012 0445   BUN 11.8 12/07/2012 1157   BUN 6 11/26/2012 0445   CREATININE 0.8 12/07/2012 1157   CREATININE 0.85 11/26/2012 0445   CREATININE 1.45* 09/11/2010 1603      Component Value Date/Time   CALCIUM 9.3 12/07/2012 1157   CALCIUM 8.5 11/26/2012 0445   ALKPHOS 210* 12/07/2012 1157   ALKPHOS 214* 11/24/2012 0535   AST 25 12/07/2012 1157   AST 17 11/24/2012 0535   ALT 19 12/07/2012 1157   ALT 15 11/24/2012 0535   BILITOT 0.39 12/07/2012 1157   BILITOT 0.4 11/24/2012 0535  RADIOGRAPHIC STUDIES:  Ct Chest W Contrast  10/13/2012   *RADIOLOGY REPORT*  Clinical Data:  Cervical cancer with metastasis.  Evaluate progression.  Restaging  CT CHEST, ABDOMEN AND PELVIS WITH CONTRAST  Technique:  Multidetector CT imaging of the chest, abdomen and pelvis was performed following the standard protocol during bolus administration of  intravenous contrast.  Contrast: OMNIPAQUE IOHEXOL 300 MG/ML  SOLN  Comparison:  CT 07/29/2012  CT CHEST  Findings:  No axillary or supraclavicular lymphadenopathy.  There is a port in the right anterior chest wall.  No mediastinal hilar lymphadenopathy.  No pericardial fluid.  Review of the lung parenchyma demonstrates no new or suspicious pulmonary nodules.  IMPRESSION: No evidence of thoracic metastasis.  CT ABDOMEN AND PELVIS  Findings:  The right hepatic lobe with metastasis are stable to slightly increased in size compared to prior.  One lesion which is increased is as along the falciform ligament in the right hepatic lobe measuring 3.8 x 6.1 cm compared to 3.6 x 4.7 cm on prior. Lesion in the posterior right hepatic lobe measures 3.6 x 3.9 cm unchanged from the 3.8 x 3.9 cm.  Lesions superior right hepatic lobe measures 3.9 x 4.3 cm compared to 4.2 x 4.4 cm on prior. There is some mild peripheral ductal dilatation  within the right hepatic lobe which appears increased compared to prior.  Mild duct dilatation of the left is similar.  Patient status post partial hepatectomy.  No significant periportal adenopathy.  7 mm periportal lymph node is unchanged (image 57).  The pancreas, spleen, adrenal glands, and right kidney normal.  The left kidney is atrophic.  There is an IVC filter inferior to the right renal vein.  No retroperitoneal or retrocrural adenopathy.  The stomach in a review of small bowel and colon unremarkable.  Post hysterectomy anatomy.  The bladder is collapsed.  No pelvic lymphadenopathy.  There is a low density collection along the left external iliac vessels measuring 31 x 21 mm which is unchanged in volume compared to prior.  This has simple fluid attenuation centrally likely represents a postsurgical fluid collection.  No evidence of pelvic lymphadenopathy. Review of  bone windows demonstrates no aggressive osseous lesions.  IMPRESSION:  1.  Essentially stable hepatic metastasis  primarily in the right hepatic lobe.  One lesion measures larger. 2.  Essentially stable intrahepatic biliary duct dilatation. 3.  Atrophic left kidney. 4.  Hysterectomy with no evidence of local recurrence.   Original Report Authenticated By: Genevive Bi, M.D.   Ct Abdomen Pelvis W Contrast  10/13/2012   *RADIOLOGY REPORT*  Clinical Data:  Cervical cancer with metastasis.  Evaluate progression.  Restaging  CT CHEST, ABDOMEN AND PELVIS WITH CONTRAST  Technique:  Multidetector CT imaging of the chest, abdomen and pelvis was performed following the standard protocol during bolus administration of intravenous contrast.  Contrast: OMNIPAQUE IOHEXOL 300 MG/ML  SOLN  Comparison:  CT 07/29/2012  CT CHEST  Findings:  No axillary or supraclavicular lymphadenopathy.  There is a port in the right anterior chest wall.  No mediastinal hilar lymphadenopathy.  No pericardial fluid.  Review of the lung parenchyma demonstrates no new or suspicious pulmonary nodules.  IMPRESSION: No evidence of thoracic metastasis.  CT ABDOMEN AND PELVIS  Findings:  The right hepatic lobe with metastasis are stable to slightly increased in size compared to prior.  One lesion which is increased is as along the falciform ligament in the right hepatic lobe measuring 3.8 x 6.1 cm  compared to 3.6 x 4.7 cm on prior. Lesion in the posterior right hepatic lobe measures 3.6 x 3.9 cm unchanged from the 3.8 x 3.9 cm.  Lesions superior right hepatic lobe measures 3.9 x 4.3 cm compared to 4.2 x 4.4 cm on prior. There is some mild peripheral ductal dilatation  within the right hepatic lobe which appears increased compared to prior.  Mild duct dilatation of the left is similar.  Patient status post partial hepatectomy.  No significant periportal adenopathy.  7 mm periportal lymph node is unchanged (image 57).  The pancreas, spleen, adrenal glands, and right kidney normal.  The left kidney is atrophic.  There is an IVC filter inferior to the right renal  vein.  No retroperitoneal or retrocrural adenopathy.  The stomach in a review of small bowel and colon unremarkable.  Post hysterectomy anatomy.  The bladder is collapsed.  No pelvic lymphadenopathy.  There is a low density collection along the left external iliac vessels measuring 31 x 21 mm which is unchanged in volume compared to prior.  This has simple fluid attenuation centrally likely represents a postsurgical fluid collection.  No evidence of pelvic lymphadenopathy. Review of  bone windows demonstrates no aggressive osseous lesions.  IMPRESSION:  1.  Essentially stable hepatic metastasis primarily in the right hepatic lobe.  One lesion measures larger. 2.  Essentially stable intrahepatic biliary duct dilatation. 3.  Atrophic left kidney. 4.  Hysterectomy with no evidence of local recurrence.   Original Report Authenticated By: Genevive Bi, M.D.    ASSESSMENT: 62 year old female with  #1 metastatic cervical carcinoma with liver metastasis. Patient has been treated with multiple therapeutic agents.  #2 patient's last treatment was with Alimta to be given every 21 days. She received cycle 1 of 07/21/2012. Thereafter due to complication was discontinued. She now with progressive disease there for she will resume cycle 2 on 10/26/2012. We have discussed using Neulasta on day 2 of her treatment to make sure that she does not develop febrile neutropenia as she did in the past. She is quite scared about this.  #3 depression and anxiety she is on Celexa.  #4 poor appetite: on megace  #5 DVT: Patient is on xeralto   PLAN:  #1Patient has recovered from hospitalization.  Her labs are stable.  I reviewed them with her.  She will proceed with cycle #4 of pemetrexed today. She will receive IV fluids with treatment.    #2 she will return tomorrow for Neulasta injection and IV fluids, and on Saturday for IV fluids.    #3 I will see her back in one week's time for followup. Patient is also encouraged  to contact me sooner if she thinks she needs to be seen.  All questions were answered. The patient knows to call the clinic with any problems, questions or concerns. We can certainly see the patient much sooner if necessary.  I spent 25 minutes counseling the patient face to face. The total time spent in the appointment was 30 minutes.  Cherie Ouch Lyn Hollingshead, NP Medical Oncology [facility] Phone: [phone]

## 2012-12-07 NOTE — Telephone Encounter (Signed)
Per staff message and POF I have scheduled appts.  JMW  

## 2012-12-07 NOTE — Patient Instructions (Signed)
Cancer Center Discharge Instructions for Patients Receiving Chemotherapy  Today you received the following chemotherapy agents Alimta  To help prevent nausea and vomiting after your treatment, we encourage you to take your nausea medication as prescribed.   If you develop nausea and vomiting that is not controlled by your nausea medication, call the clinic.   BELOW ARE SYMPTOMS THAT SHOULD BE REPORTED IMMEDIATELY:  *FEVER GREATER THAN 100.5 F  *CHILLS WITH OR WITHOUT FEVER  NAUSEA AND VOMITING THAT IS NOT CONTROLLED WITH YOUR NAUSEA MEDICATION  *UNUSUAL SHORTNESS OF BREATH  *UNUSUAL BRUISING OR BLEEDING  TENDERNESS IN MOUTH AND THROAT WITH OR WITHOUT PRESENCE OF ULCERS  *URINARY PROBLEMS  *BOWEL PROBLEMS  UNUSUAL RASH Items with * indicate a potential emergency and should be followed up as soon as possible.  Feel free to call the clinic you have any questions or concerns. The clinic phone number is [phone].   Dehydration, Adult Dehydration is when you lose more fluids from the body than you take in. Vital organs like the kidneys, brain, and heart cannot function without a proper amount of fluids and salt. Any loss of fluids from the body can cause dehydration.  CAUSES   Vomiting.  Diarrhea.  Excessive sweating.  Excessive urine output.  Fever. SYMPTOMS  Mild dehydration  Thirst.  Dry lips.  Slightly dry mouth. Moderate dehydration  Very dry mouth.  Sunken eyes.  Skin does not bounce back quickly when lightly pinched and released.  Dark urine and decreased urine production.  Decreased tear production.  Headache. Severe dehydration  Very dry mouth.  Extreme thirst.  Rapid, weak pulse (more than 100 beats per minute at rest).  Cold hands and feet.  Not able to sweat in spite of heat and temperature.  Rapid breathing.  Blue lips.  Confusion and lethargy.  Difficulty being awakened.  Minimal urine production.  No  tears. DIAGNOSIS  Your caregiver will diagnose dehydration based on your symptoms and your exam. Blood and urine tests will help confirm the diagnosis. The diagnostic evaluation should also identify the cause of dehydration. TREATMENT  Treatment of mild or moderate dehydration can often be done at home by increasing the amount of fluids that you drink. It is best to drink small amounts of fluid more often. Drinking too much at one time can make vomiting worse. Refer to the home care instructions below. Severe dehydration needs to be treated at the hospital where you will probably be given intravenous (IV) fluids that contain water and electrolytes. HOME CARE INSTRUCTIONS   Ask your caregiver about specific rehydration instructions.  Drink enough fluids to keep your urine clear or pale yellow.  Drink small amounts frequently if you have nausea and vomiting.  Eat as you normally do.  Avoid:  Foods or drinks high in sugar.  Carbonated drinks.  Juice.  Extremely hot or cold fluids.  Drinks with caffeine.  Fatty, greasy foods.  Alcohol.  Tobacco.  Overeating.  Gelatin desserts.  Wash your hands well to avoid spreading bacteria and viruses.  Only take over-the-counter or prescription medicines for pain, discomfort, or fever as directed by your caregiver.  Ask your caregiver if you should continue all prescribed and over-the-counter medicines.  Keep all follow-up appointments with your caregiver. SEEK MEDICAL CARE IF:  You have abdominal pain and it increases or stays in one area (localizes).  You have a rash, stiff neck, or severe headache.  You are irritable, sleepy, or difficult to awaken.  You  are weak, dizzy, or extremely thirsty. SEEK IMMEDIATE MEDICAL CARE IF:   You are unable to keep fluids down or you get worse despite treatment.  You have frequent episodes of vomiting or diarrhea.  You have blood or green matter (bile) in your vomit.  You have blood in  your stool or your stool looks black and tarry.  You have not urinated in 6 to 8 hours, or you have only urinated a small amount of very dark urine.  You have a fever.  You faint. MAKE SURE YOU:   Understand these instructions.  Will watch your condition.  Will get help right away if you are not doing well or get worse. Document Released: 02/15/2005 Document Revised: 05/10/2011 Document Reviewed: 10/05/2010 [facility] Patient Information 2014 [place], [place].

## 2012-12-08 ENCOUNTER — Other Ambulatory Visit: Payer: Self-pay | Admitting: Medical Oncology

## 2012-12-08 ENCOUNTER — Ambulatory Visit (HOSPITAL_BASED_OUTPATIENT_CLINIC_OR_DEPARTMENT_OTHER): Payer: 59

## 2012-12-08 ENCOUNTER — Ambulatory Visit: Payer: 59

## 2012-12-08 VITALS — BP 126/80 | HR 98 | Temp 97.6°F | Resp 20

## 2012-12-08 VITALS — BP 111/75 | HR 103 | Temp 97.6°F

## 2012-12-08 DIAGNOSIS — C787 Secondary malignant neoplasm of liver and intrahepatic bile duct: Secondary | ICD-10-CM

## 2012-12-08 DIAGNOSIS — E860 Dehydration: Secondary | ICD-10-CM

## 2012-12-08 DIAGNOSIS — C649 Malignant neoplasm of unspecified kidney, except renal pelvis: Secondary | ICD-10-CM

## 2012-12-08 DIAGNOSIS — C539 Malignant neoplasm of cervix uteri, unspecified: Secondary | ICD-10-CM

## 2012-12-08 DIAGNOSIS — Z5189 Encounter for other specified aftercare: Secondary | ICD-10-CM

## 2012-12-08 MED ORDER — SODIUM CHLORIDE 0.9 % IV SOLN
INTRAVENOUS | Status: DC
  Administered 2012-12-08: 12:00:00 via INTRAVENOUS

## 2012-12-08 MED ORDER — HEPARIN SOD (PORK) LOCK FLUSH 100 UNIT/ML IV SOLN
500.0000 [IU] | Freq: Once | INTRAVENOUS | Status: AC
  Administered 2012-12-08: 500 [IU] via INTRAVENOUS
  Filled 2012-12-08: qty 5

## 2012-12-08 MED ORDER — SODIUM CHLORIDE 0.9 % IJ SOLN
10.0000 mL | INTRAMUSCULAR | Status: DC | PRN
  Administered 2012-12-08: 10 mL via INTRAVENOUS
  Filled 2012-12-08: qty 10

## 2012-12-08 MED ORDER — PEGFILGRASTIM INJECTION 6 MG/0.6ML
6.0000 mg | Freq: Once | SUBCUTANEOUS | Status: AC
  Administered 2012-12-08: 6 mg via SUBCUTANEOUS
  Filled 2012-12-08: qty 0.6

## 2012-12-08 NOTE — Patient Instructions (Signed)
Dehydration, Adult Dehydration is when you lose more fluids from the body than you take in. Vital organs like the kidneys, brain, and heart cannot function without a proper amount of fluids and salt. Any loss of fluids from the body can cause dehydration.  CAUSES   Vomiting.  Diarrhea.  Excessive sweating.  Excessive urine output.  Fever. SYMPTOMS  Mild dehydration  Thirst.  Dry lips.  Slightly dry mouth. Moderate dehydration  Very dry mouth.  Sunken eyes.  Skin does not bounce back quickly when lightly pinched and released.  Dark urine and decreased urine production.  Decreased tear production.  Headache. Severe dehydration  Very dry mouth.  Extreme thirst.  Rapid, weak pulse (more than 100 beats per minute at rest).  Cold hands and feet.  Not able to sweat in spite of heat and temperature.  Rapid breathing.  Blue lips.  Confusion and lethargy.  Difficulty being awakened.  Minimal urine production.  No tears. DIAGNOSIS  Your caregiver will diagnose dehydration based on your symptoms and your exam. Blood and urine tests will help confirm the diagnosis. The diagnostic evaluation should also identify the cause of dehydration. TREATMENT  Treatment of mild or moderate dehydration can often be done at home by increasing the amount of fluids that you drink. It is best to drink small amounts of fluid more often. Drinking too much at one time can make vomiting worse. Refer to the home care instructions below. Severe dehydration needs to be treated at the hospital where you will probably be given intravenous (IV) fluids that contain water and electrolytes. HOME CARE INSTRUCTIONS   Ask your caregiver about specific rehydration instructions.  Drink enough fluids to keep your urine clear or pale yellow.  Drink small amounts frequently if you have nausea and vomiting.  Eat as you normally do.  Avoid:  Foods or drinks high in sugar.  Carbonated  drinks.  Juice.  Extremely hot or cold fluids.  Drinks with caffeine.  Fatty, greasy foods.  Alcohol.  Tobacco.  Overeating.  Gelatin desserts.  Wash your hands well to avoid spreading bacteria and viruses.  Only take over-the-counter or prescription medicines for pain, discomfort, or fever as directed by your caregiver.  Ask your caregiver if you should continue all prescribed and over-the-counter medicines.  Keep all follow-up appointments with your caregiver. SEEK MEDICAL CARE IF:  You have abdominal pain and it increases or stays in one area (localizes).  You have a rash, stiff neck, or severe headache.  You are irritable, sleepy, or difficult to awaken.  You are weak, dizzy, or extremely thirsty. SEEK IMMEDIATE MEDICAL CARE IF:   You are unable to keep fluids down or you get worse despite treatment.  You have frequent episodes of vomiting or diarrhea.  You have blood or green matter (bile) in your vomit.  You have blood in your stool or your stool looks black and tarry.  You have not urinated in 6 to 8 hours, or you have only urinated a small amount of very dark urine.  You have a fever.  You faint. MAKE SURE YOU:   Understand these instructions.  Will watch your condition.  Will get help right away if you are not doing well or get worse. Document Released: 02/15/2005 Document Revised: 05/10/2011 Document Reviewed: 10/05/2010 [employer] Patient Information 2014 [employer].  

## 2012-12-09 ENCOUNTER — Ambulatory Visit (HOSPITAL_BASED_OUTPATIENT_CLINIC_OR_DEPARTMENT_OTHER): Payer: 59

## 2012-12-09 VITALS — BP 116/76 | HR 102 | Temp 98.0°F

## 2012-12-09 DIAGNOSIS — E860 Dehydration: Secondary | ICD-10-CM

## 2012-12-09 DIAGNOSIS — C787 Secondary malignant neoplasm of liver and intrahepatic bile duct: Secondary | ICD-10-CM

## 2012-12-09 DIAGNOSIS — C539 Malignant neoplasm of cervix uteri, unspecified: Secondary | ICD-10-CM

## 2012-12-09 MED ORDER — SODIUM CHLORIDE 0.9 % IV SOLN
Freq: Once | INTRAVENOUS | Status: AC
  Administered 2012-12-09: 10:00:00 via INTRAVENOUS

## 2012-12-09 MED ORDER — SODIUM CHLORIDE 0.9 % IJ SOLN
10.0000 mL | INTRAMUSCULAR | Status: DC | PRN
  Administered 2012-12-09: 10 mL via INTRAVENOUS
  Filled 2012-12-09: qty 10

## 2012-12-09 MED ORDER — HEPARIN SOD (PORK) LOCK FLUSH 100 UNIT/ML IV SOLN
500.0000 [IU] | Freq: Once | INTRAVENOUS | Status: AC
  Administered 2012-12-09: 500 [IU] via INTRAVENOUS
  Filled 2012-12-09: qty 5

## 2012-12-09 NOTE — Patient Instructions (Signed)
Dehydration, Adult  Dehydration means your body does not have as much fluid as it needs. Your kidneys, brain, and heart will not work properly without the right amount of fluids and salt.   HOME CARE   Ask your doctor how to replace body fluid losses (rehydrate).   Drink enough fluids to keep your pee (urine) clear or pale yellow.   Drink small amounts of fluids often if you feel sick to your stomach (nauseous) or throw up (vomit).   Eat like you normally do.   Avoid:   Foods or drinks high in sugar.   Bubbly (carbonated) drinks.   Juice.   Very hot or cold fluids.   Drinks with caffeine.   Fatty, greasy foods.   Alcohol.   Tobacco.   Eating too much.   Gelatin desserts.   Wash your hands to avoid spreading germs (bacteria, viruses).   Only take medicine as told by your doctor.   Keep all doctor visits as told.  GET HELP RIGHT AWAY IF:    You cannot drink something without throwing up.   You get worse even with treatment.   Your vomit has blood in it or looks greenish.   Your poop (stool) has blood in it or looks black and tarry.   You have not peed in 6 to 8 hours.   You pee a small amount of very dark pee.   You have a fever.   You pass out (faint).   You have belly (abdominal) pain that gets worse or stays in one spot (localizes).   You have a rash, stiff neck, or bad headache.   You get easily annoyed, sleepy, or are hard to wake up.   You feel weak, dizzy, or very thirsty.  MAKE SURE YOU:    Understand these instructions.   Will watch your condition.   Will get help right away if you are not doing well or get worse.  Document Released: 12/12/2008 Document Revised: 05/10/2011 Document Reviewed: 10/05/2010  [employer] Patient Information 2014 [employer].

## 2012-12-09 NOTE — Progress Notes (Signed)
Drinking well , reports her urine is clear and straw colored. No nausea. IVF given as ordered.

## 2012-12-14 ENCOUNTER — Encounter: Payer: Self-pay | Admitting: Oncology

## 2012-12-14 ENCOUNTER — Ambulatory Visit (HOSPITAL_BASED_OUTPATIENT_CLINIC_OR_DEPARTMENT_OTHER): Payer: 59 | Admitting: Oncology

## 2012-12-14 ENCOUNTER — Ambulatory Visit (HOSPITAL_BASED_OUTPATIENT_CLINIC_OR_DEPARTMENT_OTHER): Payer: 59

## 2012-12-14 VITALS — BP 107/73 | HR 90 | Temp 98.2°F | Resp 18 | Ht 61.0 in | Wt 162.0 lb

## 2012-12-14 VITALS — BP 125/82 | HR 98 | Temp 97.9°F | Resp 18

## 2012-12-14 DIAGNOSIS — E860 Dehydration: Secondary | ICD-10-CM

## 2012-12-14 DIAGNOSIS — C539 Malignant neoplasm of cervix uteri, unspecified: Secondary | ICD-10-CM

## 2012-12-14 DIAGNOSIS — C787 Secondary malignant neoplasm of liver and intrahepatic bile duct: Secondary | ICD-10-CM

## 2012-12-14 MED ORDER — HEPARIN SOD (PORK) LOCK FLUSH 100 UNIT/ML IV SOLN
500.0000 [IU] | Freq: Once | INTRAVENOUS | Status: AC
  Administered 2012-12-14: 500 [IU] via INTRAVENOUS
  Filled 2012-12-14: qty 5

## 2012-12-14 MED ORDER — SODIUM CHLORIDE 0.9 % IV SOLN
Freq: Once | INTRAVENOUS | Status: AC
  Administered 2012-12-14: 13:00:00 via INTRAVENOUS

## 2012-12-14 NOTE — Patient Instructions (Signed)
Dehydration, Adult Dehydration is when you lose more fluids from the body than you take in. Vital organs like the kidneys, brain, and heart cannot function without a proper amount of fluids and salt. Any loss of fluids from the body can cause dehydration.  CAUSES   Vomiting.  Diarrhea.  Excessive sweating.  Excessive urine output.  Fever. SYMPTOMS  Mild dehydration  Thirst.  Dry lips.  Slightly dry mouth. Moderate dehydration  Very dry mouth.  Sunken eyes.  Skin does not bounce back quickly when lightly pinched and released.  Dark urine and decreased urine production.  Decreased tear production.  Headache. Severe dehydration  Very dry mouth.  Extreme thirst.  Rapid, weak pulse (more than 100 beats per minute at rest).  Cold hands and feet.  Not able to sweat in spite of heat and temperature.  Rapid breathing.  Blue lips.  Confusion and lethargy.  Difficulty being awakened.  Minimal urine production.  No tears. DIAGNOSIS  Your caregiver will diagnose dehydration based on your symptoms and your exam. Blood and urine tests will help confirm the diagnosis. The diagnostic evaluation should also identify the cause of dehydration. TREATMENT  Treatment of mild or moderate dehydration can often be done at home by increasing the amount of fluids that you drink. It is best to drink small amounts of fluid more often. Drinking too much at one time can make vomiting worse. Refer to the home care instructions below. Severe dehydration needs to be treated at the hospital where you will probably be given intravenous (IV) fluids that contain water and electrolytes. HOME CARE INSTRUCTIONS   Ask your caregiver about specific rehydration instructions.  Drink enough fluids to keep your urine clear or pale yellow.  Drink small amounts frequently if you have nausea and vomiting.  Eat as you normally do.  Avoid:  Foods or drinks high in sugar.  Carbonated  drinks.  Juice.  Extremely hot or cold fluids.  Drinks with caffeine.  Fatty, greasy foods.  Alcohol.  Tobacco.  Overeating.  Gelatin desserts.  Wash your hands well to avoid spreading bacteria and viruses.  Only take over-the-counter or prescription medicines for pain, discomfort, or fever as directed by your caregiver.  Ask your caregiver if you should continue all prescribed and over-the-counter medicines.  Keep all follow-up appointments with your caregiver. SEEK MEDICAL CARE IF:  You have abdominal pain and it increases or stays in one area (localizes).  You have a rash, stiff neck, or severe headache.  You are irritable, sleepy, or difficult to awaken.  You are weak, dizzy, or extremely thirsty. SEEK IMMEDIATE MEDICAL CARE IF:   You are unable to keep fluids down or you get worse despite treatment.  You have frequent episodes of vomiting or diarrhea.  You have blood or green matter (bile) in your vomit.  You have blood in your stool or your stool looks black and tarry.  You have not urinated in 6 to 8 hours, or you have only urinated a small amount of very dark urine.  You have a fever.  You faint. MAKE SURE YOU:   Understand these instructions.  Will watch your condition.  Will get help right away if you are not doing well or get worse. Document Released: 02/15/2005 Document Revised: 05/10/2011 Document Reviewed: 10/05/2010 [employer] Patient Information 2014 [employer].  

## 2012-12-14 NOTE — Progress Notes (Signed)
OFFICE PROGRESS NOTE  CC**  [name] Paci, MD [address] [facility] [address] [place] [address] Dr. Loree Fee  DIAGNOSIS: 62 year old female with metastatic cervical carcinoma  PRIOR THERAPY:  #1 patient underwent a radical hysterectomy bilateral salpingo-oophorectomy and pelvic lymphadenectomy in March 2005 for his stage IB cervical cancer. Her final pathology showed no residual disease in all lymph nodes were negative.  #2 patient was followed for 5 years with no evidence of recurrent disease until she developed a recurrence of the right pelvic sidewall in [place] 2010. This caused obstruction of the right ureter. She was treated with radiation therapy and concurrent cis-platinum chemotherapy.  #3 in late 2011 patient was found to have recurrence of the right inguinal lymph node and left pelvic nodes and received additional radiation therapy to those areas. His radiation was completed in January 2012. In April 2012 the patient had resolution of the right inguinal nodes. In August 2012 the patient was found to have a liver lesion which sure and 2.8 cm and received 6 cycles of Taxol. Patient had partial response to chemotherapy but the liver lesion began to increase to a size of 1.9. Reimaging showed that there were no other lesions except the liver and patient elected to a plate the liver lesion using RFA he performed a day 2013. The procedure went well but patient developed an extensive DVT in the left lower extremity and was placed on Lovenox and IVC filter  #4 patient subsequent treatments include of radiation therapy in June and August and September 2013. November 2013 she was found to have metastasis and small left inguinal lymph nodes. She resumed chemotherapy using low dose carboplatinum and Taxol between December and January 2014. This was complicated by neutropenia. Patient had CT scans in February 2014 that showed increase in size of the liver metastasis.   #5  Patient was started on Alimta in May 2014, she had a difficult time with this first cycle of chemotherapy and did not receive it again until she followed up with Dr. Stanford Breed in gyn/oncology had progressive disease and conveyed her desire to restart it.  She was then referred to Dr. Welton Flakes to restart Alimta therapy and resumed it on 10/26/12.   CURRENT THERAPY: status post cycle 4  Alimta  INTERVAL HISTORY: [name] 62 y.o. female returns for followup one week after receiving cycle 4 of her chemotherapy. She again does not feel well she's had a very rough week. She is very weak tired fatigued unable to do any kind of activities. Her by mouth intake is poor. Patient seems to be very discouraged. She is thinking about not doing any more chemotherapy. We discussed this today. I have recommended that we should try to get her through this nadir of her last cycle and then we can consider changing her treatments to a different agent or dose reducing the Alimta. She and her husband will think about this.  MEDICAL HISTORY: Past Medical History  Diagnosis Date  . Diastolic dysfunction   . MIGRAINE HEADACHE   . Atrial fibrillation     failed DCCV, CHADs2-prev pradaxa stopped due to hematuria with ureter issues/JJ   . Anemia   . HTN (hypertension)   . GLAUCOMA   . CARPAL TUNNEL SYNDROME, RIGHT   . Lymphedema     L>R leg from pelvic XRT/scarring  . Hepatic steatosis     CT scan 06/2009, 12/2009  . DVT (deep venous thrombosis) 10/2010  LLE, anticoag resumed  . CHF (congestive heart failure)     1 yr ago per pt  . Arthritis   . GERD (gastroesophageal reflux disease)   . Small kidney     left  . Radiation 10/11/11-11/19/11    5040 cGy in 28 fx's/periaortic  . Radiation 03/16/10-03/25/10    right inguinal node/left external iliac node  . Radiation 06/11/08-07/23/08    6000 cGy left pelvis  . Hyperlipidemia   . Leg swelling     left  . Cervical cancer dx 04/2003, recur 04/2008    s/p debulk  (TAHBSO), chemo/XRT; recurrent dz L pelvis dx 2010 and liver met 09/2010 s/p RFA  . Osteoporosis with fracture 2014    compression fx, s/p KP ; started prolia 06/2012- narcotic pain mgmt    ALLERGIES:  is allergic to codeine; flecainide acetate; morphine; and propafenone hcl.  MEDICATIONS:  Current Outpatient Prescriptions  Medication Sig Dispense Refill  . bisacodyl (DULCOLAX) 5 MG EC tablet Take 5 mg by mouth daily as needed for constipation.      . cetirizine (ZYRTEC) 10 MG tablet Take 10 mg by mouth every evening.       . citalopram (CELEXA) 20 MG tablet Take 20 mg by mouth every evening.       . diltiazem (CARDIZEM CD) 180 MG 24 hr capsule Take 1 capsule (180 mg total) by mouth daily. TAke 2-3 hours before of after Metoprolol  30 capsule  0  . fentaNYL (DURAGESIC - DOSED MCG/HR) 100 MCG/HR Place 1 patch (100 mcg total) onto the skin every 3 (three) days. Total dose 125mg   10 patch  0  . fentaNYL (DURAGESIC - DOSED MCG/HR) 25 MCG/HR patch Place 1 patch (25 mcg total) onto the skin every 3 (three) days. Total dose of 125 mcg  10 patch  0  . ferrous fumarate (HEMOCYTE - 106 MG FE) 325 (106 FE) MG TABS Take 1 tablet (106 mg of iron total) by mouth daily.  30 each  0  . folic acid (FOLVITE) 1 MG tablet Take 1 mg by mouth every morning.      [name] HYDROmorphone (DILAUDID) 4 MG tablet Take 1 tablet every 4 hours as needed for pain  120 tablet  0  . ibuprofen (ADVIL,MOTRIN) 200 MG tablet Take 600 mg by mouth 3 (three) times daily as needed for pain. Pain      . lidocaine-prilocaine (EMLA) cream Apply 1 application topically daily as needed (for port access).      . LORazepam (ATIVAN) 1 MG tablet Take 1 tablet (1 mg total) by mouth every 6 (six) hours as needed (for nausea (may put under tongue)).  30 tablet  2  . metoprolol (LOPRESSOR) 25 MG tablet Take 3 tablets (75 mg total) by mouth 2 (two) times daily.  60 tablet  0  . Nutritional Supplements (CARNATION INSTANT BREAKFAST PO) Take 1 Can by mouth 4  (four) times daily as needed (for meals).      [name] omeprazole (PRILOSEC) 20 MG capsule Take 20 mg by mouth every morning.       . ondansetron (ZOFRAN) 8 MG tablet Take 1 tablet (8 mg total) by mouth every 8 (eight) hours as needed for nausea.  30 tablet  2  . polyethylene glycol (MIRALAX / GLYCOLAX) packet Take 17 g by mouth 2 (two) times daily as needed (constipation).       [name] PRESCRIPTION MEDICATION Inject into the vein every 21 ( twenty-one) days. PEMEtrexed (ALIMTA) 950  mg in sodium chloride 0.9 % 100 mL chemo infusion 500 mg/m2  1.91 m2 (Treatment Plan Actual) md Welton Flakes      . protein supplement (UNJURY CHICKEN SOUP) POWD Take 2 oz by mouth 4 (four) times daily as needed (for meals).      . Rivaroxaban (XARELTO) 20 MG TABS Take 1 tablet (20 mg total) by mouth every evening.  30 tablet  5  . sodium chloride (OCEAN) 0.65 % nasal spray Place 1 spray into the nose daily as needed for congestion. Allergies      . triamcinolone (NASACORT) 55 MCG/ACT nasal inhaler Place 2 sprays into the nose daily as needed (allergies or congestion).       [name] zolpidem (AMBIEN) 5 MG tablet Take 1 tablet (5 mg total) by mouth at bedtime as needed for sleep.  20 tablet  0   No current facility-administered medications for this visit.    SURGICAL HISTORY:  Past Surgical History  Procedure Laterality Date  . Ureteral stent placement  2005, 01/2010, 07/2010    L hydro related to cervical ca and XRT  . Oophorectomy  2005  . Cardioversion    . Tonsillectomy    . Cholecystectomy  1984  . Total abdominal hysterectomy  2005  . Carpal tunnel  2009    right  . Ivc filter  04/2011    REVIEW OF SYSTEMS:  A 10 point review of systems was conducted and is otherwise negative except for what is noted above.    PHYSICAL EXAMINATION: Blood pressure 107/73, pulse 90, temperature 98.2 F (36.8 C), temperature source Oral, resp. rate 18, height 5\' 1"  (1.549 m), weight 162 lb (73.483 kg). Body mass index is 30.63  kg/(m^2). General: Patient is a well appearing female in no acute distress HEENT: PERRLA, sclerae anicteric no conjunctival pallor, MMM Neck: supple, no palpable adenopathy Lungs: clear to auscultation bilaterally, no wheezes, rhonchi, or rales Cardiovascular: regular rate rhythm, S1, S2, no murmurs, rubs or gallops Abdomen: Soft, non-tender, non-distended, normoactive bowel sounds, no HSM Extremities: warm and well perfused, no clubbing, cyanosis, increased edema in left lower extremity, with known h/o DVT in that leg.   Skin: No rashes or lesions Neuro: Non-focal ECOG PERFORMANCE STATUS: 0 - Asymptomatic   LABORATORY DATA: Lab Results  Component Value Date   WBC 4.1 12/07/2012   HGB 9.2* 12/07/2012   HCT 28.7* 12/07/2012   MCV 87.8 12/07/2012   PLT 270 12/07/2012      Chemistry      Component Value Date/Time   NA 133* 12/07/2012 1157   NA 135 11/26/2012 0445   K 4.6 12/07/2012 1157   K 3.7 11/26/2012 0445   CL 104 11/26/2012 0445   CL 108* 08/15/2012 1252   CO2 26 12/07/2012 1157   CO2 23 11/26/2012 0445   BUN 11.8 12/07/2012 1157   BUN 6 11/26/2012 0445   CREATININE 0.8 12/07/2012 1157   CREATININE 0.85 11/26/2012 0445   CREATININE 1.45* 09/11/2010 1603      Component Value Date/Time   CALCIUM 9.3 12/07/2012 1157   CALCIUM 8.5 11/26/2012 0445   ALKPHOS 210* 12/07/2012 1157   ALKPHOS 214* 11/24/2012 0535   AST 25 12/07/2012 1157   AST 17 11/24/2012 0535   ALT 19 12/07/2012 1157   ALT 15 11/24/2012 0535   BILITOT 0.39 12/07/2012 1157   BILITOT 0.4 11/24/2012 0535       RADIOGRAPHIC STUDIES:  Ct Chest W Contrast  10/13/2012   *RADIOLOGY REPORT*  Clinical Data:  Cervical cancer with metastasis.  Evaluate progression.  Restaging  CT CHEST, ABDOMEN AND PELVIS WITH CONTRAST  Technique:  Multidetector CT imaging of the chest, abdomen and pelvis was performed following the standard protocol during bolus administration of intravenous contrast.  Contrast: OMNIPAQUE IOHEXOL 300 MG/ML  SOLN   Comparison:  CT 07/29/2012  CT CHEST  Findings:  No axillary or supraclavicular lymphadenopathy.  There is a port in the right anterior chest wall.  No mediastinal hilar lymphadenopathy.  No pericardial fluid.  Review of the lung parenchyma demonstrates no new or suspicious pulmonary nodules.  IMPRESSION: No evidence of thoracic metastasis.  CT ABDOMEN AND PELVIS  Findings:  The right hepatic lobe with metastasis are stable to slightly increased in size compared to prior.  One lesion which is increased is as along the falciform ligament in the right hepatic lobe measuring 3.8 x 6.1 cm compared to 3.6 x 4.7 cm on prior. Lesion in the posterior right hepatic lobe measures 3.6 x 3.9 cm unchanged from the 3.8 x 3.9 cm.  Lesions superior right hepatic lobe measures 3.9 x 4.3 cm compared to 4.2 x 4.4 cm on prior. There is some mild peripheral ductal dilatation  within the right hepatic lobe which appears increased compared to prior.  Mild duct dilatation of the left is similar.  Patient status post partial hepatectomy.  No significant periportal adenopathy.  7 mm periportal lymph node is unchanged (image 57).  The pancreas, spleen, adrenal glands, and right kidney normal.  The left kidney is atrophic.  There is an IVC filter inferior to the right renal vein.  No retroperitoneal or retrocrural adenopathy.  The stomach in a review of small bowel and colon unremarkable.  Post hysterectomy anatomy.  The bladder is collapsed.  No pelvic lymphadenopathy.  There is a low density collection along the left external iliac vessels measuring 31 x 21 mm which is unchanged in volume compared to prior.  This has simple fluid attenuation centrally likely represents a postsurgical fluid collection.  No evidence of pelvic lymphadenopathy. Review of  bone windows demonstrates no aggressive osseous lesions.  IMPRESSION:  1.  Essentially stable hepatic metastasis primarily in the right hepatic lobe.  One lesion measures larger. 2.   Essentially stable intrahepatic biliary duct dilatation. 3.  Atrophic left kidney. 4.  Hysterectomy with no evidence of local recurrence.   Original Report Authenticated By: Genevive Bi, M.D.   Ct Abdomen Pelvis W Contrast  10/13/2012   *RADIOLOGY REPORT*  Clinical Data:  Cervical cancer with metastasis.  Evaluate progression.  Restaging  CT CHEST, ABDOMEN AND PELVIS WITH CONTRAST  Technique:  Multidetector CT imaging of the chest, abdomen and pelvis was performed following the standard protocol during bolus administration of intravenous contrast.  Contrast: OMNIPAQUE IOHEXOL 300 MG/ML  SOLN  Comparison:  CT 07/29/2012  CT CHEST  Findings:  No axillary or supraclavicular lymphadenopathy.  There is a port in the right anterior chest wall.  No mediastinal hilar lymphadenopathy.  No pericardial fluid.  Review of the lung parenchyma demonstrates no new or suspicious pulmonary nodules.  IMPRESSION: No evidence of thoracic metastasis.  CT ABDOMEN AND PELVIS  Findings:  The right hepatic lobe with metastasis are stable to slightly increased in size compared to prior.  One lesion which is increased is as along the falciform ligament in the right hepatic lobe measuring 3.8 x 6.1 cm compared to 3.6 x 4.7 cm on prior. Lesion in the posterior right hepatic  lobe measures 3.6 x 3.9 cm unchanged from the 3.8 x 3.9 cm.  Lesions superior right hepatic lobe measures 3.9 x 4.3 cm compared to 4.2 x 4.4 cm on prior. There is some mild peripheral ductal dilatation  within the right hepatic lobe which appears increased compared to prior.  Mild duct dilatation of the left is similar.  Patient status post partial hepatectomy.  No significant periportal adenopathy.  7 mm periportal lymph node is unchanged (image 57).  The pancreas, spleen, adrenal glands, and right kidney normal.  The left kidney is atrophic.  There is an IVC filter inferior to the right renal vein.  No retroperitoneal or retrocrural adenopathy.  The stomach in  a review of small bowel and colon unremarkable.  Post hysterectomy anatomy.  The bladder is collapsed.  No pelvic lymphadenopathy.  There is a low density collection along the left external iliac vessels measuring 31 x 21 mm which is unchanged in volume compared to prior.  This has simple fluid attenuation centrally likely represents a postsurgical fluid collection.  No evidence of pelvic lymphadenopathy. Review of  bone windows demonstrates no aggressive osseous lesions.  IMPRESSION:  1.  Essentially stable hepatic metastasis primarily in the right hepatic lobe.  One lesion measures larger. 2.  Essentially stable intrahepatic biliary duct dilatation. 3.  Atrophic left kidney. 4.  Hysterectomy with no evidence of local recurrence.   Original Report Authenticated By: Genevive Bi, M.D.    ASSESSMENT: 62 year old female with  #1 metastatic cervical carcinoma with liver metastasis. Patient has been treated with multiple therapeutic agents.  #2 patient's last treatment was with Alimta to be given every 21 days. She received cycle 1 of 07/21/2012. Thereafter due to complication was discontinued. She now with progressive disease there for she will resume cycle 2 on 10/26/2012. We have discussed using Neulasta on day 2 of her treatment to make sure that she does not develop febrile neutropenia as she did in the past. She is quite scared about this.  #3 depression and anxiety she is on Celexa.  #4 poor appetite: on megace  #5 DVT: Patient is on xeralto   PLAN:  #1 patient will proceed with IV fluids today and an anti-emetics.  #2 I have discussed the possibility of changing her treatments next time.  #3 she'll be seen back in 2 weeks' time for followup  All questions were answered. The patient knows to call the clinic with any problems, questions or concerns. We can certainly see the patient much sooner if necessary.  I spent 25 minutes counseling the patient face to face. The total time spent in  the appointment was 30 minutes.  Drue Second, MD Medical/Oncology [facility] [phone] (beeper) [phone] (Office)

## 2012-12-14 NOTE — Progress Notes (Signed)
1515 IVF's complete. VSS. Patient with no complaints of nausea or dizziness. Discharge via w/c with spouse and AVS provided. Encouraged patient to call MD should she have any questions or concerns. Patient verbalized understanding,

## 2012-12-17 ENCOUNTER — Encounter (HOSPITAL_COMMUNITY): Payer: Self-pay | Admitting: Emergency Medicine

## 2012-12-17 ENCOUNTER — Inpatient Hospital Stay (HOSPITAL_COMMUNITY)
Admission: EM | Admit: 2012-12-17 | Discharge: 2012-12-19 | DRG: 947 | Disposition: A | Discharge status: Home / Self Care | Payer: 59 | Attending: Internal Medicine | Admitting: Internal Medicine

## 2012-12-17 DIAGNOSIS — C787 Secondary malignant neoplasm of liver and intrahepatic bile duct: Secondary | ICD-10-CM | POA: Diagnosis present

## 2012-12-17 DIAGNOSIS — Z923 Personal history of irradiation: Secondary | ICD-10-CM

## 2012-12-17 DIAGNOSIS — R531 Weakness: Secondary | ICD-10-CM

## 2012-12-17 DIAGNOSIS — G43909 Migraine, unspecified, not intractable, without status migrainosus: Secondary | ICD-10-CM

## 2012-12-17 DIAGNOSIS — Z791 Long term (current) use of non-steroidal anti-inflammatories (NSAID): Secondary | ICD-10-CM

## 2012-12-17 DIAGNOSIS — Z79899 Other long term (current) drug therapy: Secondary | ICD-10-CM

## 2012-12-17 DIAGNOSIS — R197 Diarrhea, unspecified: Secondary | ICD-10-CM

## 2012-12-17 DIAGNOSIS — I10 Essential (primary) hypertension: Secondary | ICD-10-CM

## 2012-12-17 DIAGNOSIS — E860 Dehydration: Secondary | ICD-10-CM

## 2012-12-17 DIAGNOSIS — R748 Abnormal levels of other serum enzymes: Secondary | ICD-10-CM

## 2012-12-17 DIAGNOSIS — F329 Major depressive disorder, single episode, unspecified: Secondary | ICD-10-CM

## 2012-12-17 DIAGNOSIS — D638 Anemia in other chronic diseases classified elsewhere: Secondary | ICD-10-CM

## 2012-12-17 DIAGNOSIS — E440 Moderate protein-calorie malnutrition: Secondary | ICD-10-CM

## 2012-12-17 DIAGNOSIS — C799 Secondary malignant neoplasm of unspecified site: Secondary | ICD-10-CM

## 2012-12-17 DIAGNOSIS — G8929 Other chronic pain: Secondary | ICD-10-CM | POA: Diagnosis present

## 2012-12-17 DIAGNOSIS — D709 Neutropenia, unspecified: Secondary | ICD-10-CM

## 2012-12-17 DIAGNOSIS — Z7901 Long term (current) use of anticoagulants: Secondary | ICD-10-CM

## 2012-12-17 DIAGNOSIS — E785 Hyperlipidemia, unspecified: Secondary | ICD-10-CM | POA: Diagnosis present

## 2012-12-17 DIAGNOSIS — I82509 Chronic embolism and thrombosis of unspecified deep veins of unspecified lower extremity: Secondary | ICD-10-CM | POA: Diagnosis present

## 2012-12-17 DIAGNOSIS — T451X5A Adverse effect of antineoplastic and immunosuppressive drugs, initial encounter: Secondary | ICD-10-CM | POA: Diagnosis present

## 2012-12-17 DIAGNOSIS — C539 Malignant neoplasm of cervix uteri, unspecified: Secondary | ICD-10-CM

## 2012-12-17 DIAGNOSIS — Z801 Family history of malignant neoplasm of trachea, bronchus and lung: Secondary | ICD-10-CM

## 2012-12-17 DIAGNOSIS — I4891 Unspecified atrial fibrillation: Secondary | ICD-10-CM

## 2012-12-17 DIAGNOSIS — K219 Gastro-esophageal reflux disease without esophagitis: Secondary | ICD-10-CM

## 2012-12-17 DIAGNOSIS — K7689 Other specified diseases of liver: Secondary | ICD-10-CM | POA: Diagnosis present

## 2012-12-17 DIAGNOSIS — M129 Arthropathy, unspecified: Secondary | ICD-10-CM | POA: Diagnosis present

## 2012-12-17 DIAGNOSIS — N390 Urinary tract infection, site not specified: Secondary | ICD-10-CM | POA: Diagnosis present

## 2012-12-17 DIAGNOSIS — F3289 Other specified depressive episodes: Secondary | ICD-10-CM

## 2012-12-17 DIAGNOSIS — E871 Hypo-osmolality and hyponatremia: Secondary | ICD-10-CM

## 2012-12-17 DIAGNOSIS — R110 Nausea: Secondary | ICD-10-CM

## 2012-12-17 DIAGNOSIS — H409 Unspecified glaucoma: Secondary | ICD-10-CM | POA: Diagnosis present

## 2012-12-17 DIAGNOSIS — D6481 Anemia due to antineoplastic chemotherapy: Secondary | ICD-10-CM | POA: Diagnosis present

## 2012-12-17 DIAGNOSIS — Z87891 Personal history of nicotine dependence: Secondary | ICD-10-CM

## 2012-12-17 DIAGNOSIS — M810 Age-related osteoporosis without current pathological fracture: Secondary | ICD-10-CM | POA: Diagnosis present

## 2012-12-17 DIAGNOSIS — D696 Thrombocytopenia, unspecified: Secondary | ICD-10-CM

## 2012-12-17 DIAGNOSIS — R5381 Other malaise: Principal | ICD-10-CM | POA: Diagnosis present

## 2012-12-17 DIAGNOSIS — E43 Unspecified severe protein-calorie malnutrition: Secondary | ICD-10-CM

## 2012-12-17 DIAGNOSIS — R112 Nausea with vomiting, unspecified: Secondary | ICD-10-CM

## 2012-12-17 LAB — URINALYSIS, ROUTINE W REFLEX MICROSCOPIC
Glucose, UA: NEGATIVE mg/dL
Ketones, ur: NEGATIVE mg/dL
Nitrite: NEGATIVE
Protein, ur: 30 mg/dL — AB
Specific Gravity, Urine: 1.015 (ref 1.005–1.030)
Urobilinogen, UA: 1 mg/dL (ref 0.0–1.0)

## 2012-12-17 LAB — CBC
Hemoglobin: 9.5 g/dL — ABNORMAL LOW (ref 12.0–15.0)
MCH: 29 pg (ref 26.0–34.0)
MCHC: 34.1 g/dL (ref 30.0–36.0)
MCV: 85.1 fL (ref 78.0–100.0)
Platelets: 162 10*3/uL (ref 150–400)
RDW: 17.6 % — ABNORMAL HIGH (ref 11.5–15.5)
WBC: 9.9 10*3/uL (ref 4.0–10.5)

## 2012-12-17 LAB — COMPREHENSIVE METABOLIC PANEL
AST: 20 U/L (ref 0–37)
Albumin: 3.1 g/dL — ABNORMAL LOW (ref 3.5–5.2)
BUN: 7 mg/dL (ref 6–23)
Calcium: 10.1 mg/dL (ref 8.4–10.5)
Chloride: 92 mEq/L — ABNORMAL LOW (ref 96–112)
Creatinine, Ser: 0.77 mg/dL (ref 0.50–1.10)
GFR calc non Af Amer: 89 mL/min — ABNORMAL LOW (ref >90)
Glucose, Bld: 109 mg/dL — ABNORMAL HIGH (ref 70–99)
Potassium: 3.6 mEq/L (ref 3.5–5.1)
Total Protein: 7.1 g/dL (ref 6.0–8.3)

## 2012-12-17 LAB — OSMOLALITY: Osmolality: 273 mOsm/kg — ABNORMAL LOW (ref 275–300)

## 2012-12-17 LAB — URINE MICROSCOPIC-ADD ON

## 2012-12-17 MED ORDER — LIDOCAINE-PRILOCAINE 2.5-2.5 % EX CREA
1.0000 "application " | TOPICAL_CREAM | Freq: Every day | CUTANEOUS | Status: DC | PRN
  Filled 2012-12-17: qty 5

## 2012-12-17 MED ORDER — HYDROMORPHONE HCL 4 MG PO TABS
4.0000 mg | ORAL_TABLET | Freq: Four times a day (QID) | ORAL | Status: DC | PRN
  Administered 2012-12-17 – 2012-12-19 (×5): 4 mg via ORAL
  Filled 2012-12-17 (×3): qty 1
  Filled 2012-12-17: qty 2
  Filled 2012-12-17 (×2): qty 1

## 2012-12-17 MED ORDER — DILTIAZEM HCL ER COATED BEADS 180 MG PO CP24
180.0000 mg | ORAL_CAPSULE | Freq: Every day | ORAL | Status: DC
  Administered 2012-12-18 – 2012-12-19 (×2): 180 mg via ORAL
  Filled 2012-12-17 (×3): qty 1

## 2012-12-17 MED ORDER — ONDANSETRON HCL 4 MG PO TABS: 4.0000 mg | ORAL_TABLET | Freq: Four times a day (QID) | ORAL | Status: DC | PRN

## 2012-12-17 MED ORDER — SODIUM CHLORIDE 0.9 % IV SOLN
INTRAVENOUS | Status: AC
  Administered 2012-12-17: 17:00:00 via INTRAVENOUS

## 2012-12-17 MED ORDER — ONDANSETRON HCL 4 MG/2ML IJ SOLN
4.0000 mg | Freq: Once | INTRAMUSCULAR | Status: AC
  Administered 2012-12-17: 4 mg via INTRAVENOUS
  Filled 2012-12-17: qty 2

## 2012-12-17 MED ORDER — FOLIC ACID 1 MG PO TABS
1.0000 mg | ORAL_TABLET | Freq: Every morning | ORAL | Status: DC
  Administered 2012-12-18 – 2012-12-19 (×2): 1 mg via ORAL
  Filled 2012-12-17 (×2): qty 1

## 2012-12-17 MED ORDER — FERROUS FUMARATE 325 (106 FE) MG PO TABS
1.0000 | ORAL_TABLET | Freq: Every day | ORAL | Status: DC
  Administered 2012-12-18 – 2012-12-19 (×2): 106 mg via ORAL
  Filled 2012-12-17 (×2): qty 1

## 2012-12-17 MED ORDER — BISACODYL 5 MG PO TBEC: 5.0000 mg | DELAYED_RELEASE_TABLET | Freq: Every day | ORAL | Status: DC | PRN

## 2012-12-17 MED ORDER — DILTIAZEM HCL ER COATED BEADS 180 MG PO CP24: 180.0000 mg | ORAL_CAPSULE | Freq: Every day | ORAL | Status: DC

## 2012-12-17 MED ORDER — FENTANYL 25 MCG/HR TD PT72
125.0000 ug | MEDICATED_PATCH | TRANSDERMAL | Status: DC
  Administered 2012-12-17: 125 ug via TRANSDERMAL
  Filled 2012-12-17 (×2): qty 1

## 2012-12-17 MED ORDER — METOPROLOL TARTRATE 50 MG PO TABS
75.0000 mg | ORAL_TABLET | Freq: Two times a day (BID) | ORAL | Status: DC
  Administered 2012-12-17 – 2012-12-19 (×4): 75 mg via ORAL
  Filled 2012-12-17 (×5): qty 1

## 2012-12-17 MED ORDER — ONDANSETRON HCL 4 MG/2ML IJ SOLN: 4.0000 mg | Freq: Three times a day (TID) | INTRAMUSCULAR | Status: DC | PRN

## 2012-12-17 MED ORDER — SODIUM CHLORIDE 0.9 % IV BOLUS (SEPSIS)
1000.0000 mL | Freq: Once | INTRAVENOUS | Status: AC
  Administered 2012-12-17: 1000 mL via INTRAVENOUS

## 2012-12-17 MED ORDER — SODIUM CHLORIDE 0.9 % IJ SOLN
3.0000 mL | Freq: Two times a day (BID) | INTRAMUSCULAR | Status: DC
  Administered 2012-12-18: 10:00:00 3 mL via INTRAVENOUS

## 2012-12-17 MED ORDER — DEXTROSE 5 % IV SOLN
1.0000 g | INTRAVENOUS | Status: DC
  Administered 2012-12-17: 1 g via INTRAVENOUS
  Filled 2012-12-17 (×2): qty 10

## 2012-12-17 MED ORDER — RIVAROXABAN 20 MG PO TABS
20.0000 mg | ORAL_TABLET | Freq: Every evening | ORAL | Status: DC
  Administered 2012-12-17 – 2012-12-18 (×2): 20 mg via ORAL
  Filled 2012-12-17 (×3): qty 1

## 2012-12-17 MED ORDER — PANTOPRAZOLE SODIUM 40 MG PO TBEC
40.0000 mg | DELAYED_RELEASE_TABLET | Freq: Every day | ORAL | Status: DC
  Administered 2012-12-18 – 2012-12-19 (×2): 40 mg via ORAL
  Filled 2012-12-17 (×2): qty 1

## 2012-12-17 MED ORDER — LORAZEPAM 0.5 MG PO TABS
0.5000 mg | ORAL_TABLET | Freq: Four times a day (QID) | ORAL | Status: DC | PRN
  Administered 2012-12-17 (×2): 0.5 mg via ORAL
  Filled 2012-12-17 (×3): qty 1

## 2012-12-17 MED ORDER — SODIUM CHLORIDE 0.9 % IV SOLN: INTRAVENOUS | Status: DC

## 2012-12-17 MED ORDER — POLYETHYLENE GLYCOL 3350 17 G PO PACK
17.0000 g | PACK | Freq: Two times a day (BID) | ORAL | Status: DC | PRN
  Filled 2012-12-17: qty 1

## 2012-12-17 MED ORDER — ONDANSETRON HCL 4 MG/2ML IJ SOLN
4.0000 mg | Freq: Four times a day (QID) | INTRAMUSCULAR | Status: DC | PRN
  Administered 2012-12-17 – 2012-12-19 (×2): 4 mg via INTRAVENOUS
  Filled 2012-12-17 (×2): qty 2

## 2012-12-17 MED ORDER — CITALOPRAM HYDROBROMIDE 20 MG PO TABS
20.0000 mg | ORAL_TABLET | Freq: Every evening | ORAL | Status: DC
  Administered 2012-12-17 – 2012-12-18 (×2): 20 mg via ORAL
  Filled 2012-12-17 (×3): qty 1

## 2012-12-17 NOTE — ED Provider Notes (Signed)
CSN: [id number]     Arrival date & time 12/17/12  1159 History   First MD Initiated Contact with Patient 12/17/12 1248     Chief Complaint  Patient presents with  . Weakness  . Nausea   (Consider location/radiation/quality/duration/timing/severity/associated sxs/prior Treatment) Patient is a 62 y.o. female presenting with weakness. The history is provided by the patient.  Weakness Pertinent negatives include no chest pain, no abdominal pain, no headaches and no shortness of breath.  pt with metastatic cervical ca, s/p last chemo 2 weeks ago, c/o poor appetite, poor po intake, generalized weakness, nausea, all for past couple weeks. States has come in for ivf several times, including in past week. States has chronic pain related to liver mets, ruq, no recent change, pain currently controlled. Denies abd distension. Having normal bms. Denies headache. No cp or sob. No cough or uri c/o. Denies fever or chills. No dysuria or gu c/o.       Past Medical History  Diagnosis Date  . Diastolic dysfunction   . MIGRAINE HEADACHE   . Atrial fibrillation     failed DCCV, CHADs2-prev pradaxa stopped due to hematuria with ureter issues/JJ   . Anemia   . HTN (hypertension)   . GLAUCOMA   . CARPAL TUNNEL SYNDROME, RIGHT   . Lymphedema     L>R leg from pelvic XRT/scarring  . Hepatic steatosis     CT scan 06/2009, 12/2009  . DVT (deep venous thrombosis) 10/2010    LLE, anticoag resumed  . CHF (congestive heart failure)     1 yr ago per pt  . Arthritis   . GERD (gastroesophageal reflux disease)   . Small kidney     left  . Radiation 10/11/11-11/19/11    5040 cGy in 28 fx's/periaortic  . Radiation 03/16/10-03/25/10    right inguinal node/left external iliac node  . Radiation 06/11/08-07/23/08    6000 cGy left pelvis  . Hyperlipidemia   . Leg swelling     left  . Cervical cancer dx 04/2003, recur 04/2008    s/p debulk (TAHBSO), chemo/XRT; recurrent dz L pelvis dx 2010 and liver met 09/2010 s/p RFA   . Osteoporosis with fracture 2014    compression fx, s/p KP ; started prolia 06/2012- narcotic pain mgmt   Past Surgical History  Procedure Laterality Date  . Ureteral stent placement  2005, 01/2010, 07/2010    L hydro related to cervical ca and XRT  . Oophorectomy  2005  . Cardioversion    . Tonsillectomy    . Cholecystectomy  1984  . Total abdominal hysterectomy  2005  . Carpal tunnel  2009    right  . Ivc filter  04/2011   Family History  Problem Relation Age of Onset  . Lung cancer    . Hypertension    . Heart disease    . Stroke    . Asthma    . Asthma Son   . Cancer Mother     lung  . Stroke Father    History  Substance Use Topics  . Smoking status: Former Smoker -- 0.50 packs/day for 3 years    Quit date: 03/01/1984  . Smokeless tobacco: Never Used  . Alcohol Use: No   OB History   Grav Para Term Preterm Abortions TAB SAB Ect Mult Living                 Review of Systems  Constitutional: Negative for fever and chills.  HENT: Negative for  sore throat.   Eyes: Negative for redness.  Respiratory: Negative for shortness of breath.   Cardiovascular: Negative for chest pain.  Gastrointestinal: Positive for nausea. Negative for vomiting and abdominal pain.  Genitourinary: Negative for flank pain.  Musculoskeletal: Negative for back pain and neck pain.  Skin: Negative for rash.  Neurological: Positive for weakness. Negative for headaches.  Hematological: Does not bruise/bleed easily.  Psychiatric/Behavioral: Negative for confusion.    Allergies  Codeine; Flecainide acetate; Morphine; and Propafenone hcl  Home Medications   Current Outpatient Rx  Name  Route  Sig  Dispense  Refill  . bisacodyl (DULCOLAX) 5 MG EC tablet   Oral   Take 5 mg by mouth daily as needed for constipation.         . cetirizine (ZYRTEC) 10 MG tablet   Oral   Take 10 mg by mouth every evening.          . citalopram (CELEXA) 20 MG tablet   Oral   Take 20 mg by mouth every  evening.          . diltiazem (CARDIZEM CD) 180 MG 24 hr capsule   Oral   Take 1 capsule (180 mg total) by mouth daily. TAke 2-3 hours before of after Metoprolol   30 capsule   0   . fentaNYL (DURAGESIC - DOSED MCG/HR) 100 MCG/HR   Transdermal   Place 1 patch (100 mcg total) onto the skin every 3 (three) days. Total dose 125mg    10 patch   0   . ferrous fumarate (HEMOCYTE - 106 MG FE) 325 (106 FE) MG TABS   Oral   Take 1 tablet (106 mg of iron total) by mouth daily.   30 each   0   . folic acid (FOLVITE) 1 MG tablet   Oral   Take 1 mg by mouth every morning.         [name] HYDROmorphone (DILAUDID) 4 MG tablet      Take 1 tablet every 4 hours as needed for pain   120 tablet   0   . ibuprofen (ADVIL,MOTRIN) 200 MG tablet   Oral   Take 600 mg by mouth 3 (three) times daily as needed for pain. Pain         . lidocaine-prilocaine (EMLA) cream   Topical   Apply 1 application topically daily as needed (for port access).         . LORazepam (ATIVAN) 1 MG tablet   Oral   Take 1 tablet (1 mg total) by mouth every 6 (six) hours as needed (for nausea (may put under tongue)).   30 tablet   2   . metoprolol (LOPRESSOR) 25 MG tablet   Oral   Take 3 tablets (75 mg total) by mouth 2 (two) times daily.   60 tablet   0   . Nutritional Supplements (CARNATION INSTANT BREAKFAST PO)   Oral   Take 1 Can by mouth 4 (four) times daily as needed (for meals).         [name] omeprazole (PRILOSEC) 20 MG capsule   Oral   Take 20 mg by mouth every morning.          . ondansetron (ZOFRAN) 8 MG tablet   Oral   Take 1 tablet (8 mg total) by mouth every 8 (eight) hours as needed for nausea.   30 tablet   2   . polyethylene glycol (MIRALAX / GLYCOLAX) packet   Oral  Take 17 g by mouth 2 (two) times daily as needed (constipation).          [name] PRESCRIPTION MEDICATION   Intravenous   Inject into the vein every 21 ( twenty-one) days. PEMEtrexed (ALIMTA) 950 mg in sodium chloride 0.9  % 100 mL chemo infusion 500 mg/m2  1.91 m2 (Treatment Plan Actual) md Welton Flakes         . protein supplement (UNJURY CHICKEN SOUP) POWD   Oral   Take 2 oz by mouth 4 (four) times daily as needed (for meals).         . Rivaroxaban (XARELTO) 20 MG TABS   Oral   Take 1 tablet (20 mg total) by mouth every evening.   30 tablet   5   . sodium chloride (OCEAN) 0.65 % nasal spray   Nasal   Place 1 spray into the nose daily as needed for congestion. Allergies         . triamcinolone (NASACORT) 55 MCG/ACT nasal inhaler   Nasal   Place 2 sprays into the nose daily as needed (allergies or congestion).          [name] zolpidem (AMBIEN) 5 MG tablet   Oral   Take 1 tablet (5 mg total) by mouth at bedtime as needed for sleep.   20 tablet   0    BP 128/84  Pulse 108  Temp(Src) 97.5 F (36.4 C) (Oral)  Resp 18  SpO2 96% Physical Exam  Nursing note and vitals reviewed. Constitutional: She is oriented to person, place, and time. She appears well-developed and well-nourished. No distress.  HENT:  Mouth/Throat: Oropharynx is clear and moist.  Eyes: Conjunctivae are normal. No scleral icterus.  Neck: Neck supple. No tracheal deviation present.  Cardiovascular: Normal rate, regular rhythm, normal heart sounds and intact distal pulses.   Pulmonary/Chest: Effort normal and breath sounds normal. No respiratory distress.  Port right chest without sign of infection  Abdominal: Soft. Normal appearance and bowel sounds are normal. She exhibits no distension. There is no tenderness. There is no rebound and no guarding.  Genitourinary:  No cva tenderness  Musculoskeletal: She exhibits no edema and no tenderness.  Neurological: She is alert and oriented to person, place, and time.  Skin: Skin is warm and dry. No rash noted.  Psychiatric: She has a normal mood and affect.    ED Course  Procedures (including critical care time)  EKG Interpretation   None      Results for orders placed during  the hospital encounter of 12/17/12  CBC      Result Value Range   WBC 9.9  4.0 - 10.5 K/uL   RBC 3.28 (*) 3.87 - 5.11 MIL/uL   Hemoglobin 9.5 (*) 12.0 - 15.0 g/dL   HCT 60.4 (*) 54.0 - 98.1 %   MCV 85.1  78.0 - 100.0 fL   MCH 29.0  26.0 - 34.0 pg   MCHC 34.1  30.0 - 36.0 g/dL   RDW 19.1 (*) 47.8 - 29.5 %   Platelets 162  150 - 400 K/uL  COMPREHENSIVE METABOLIC PANEL      Result Value Range   Sodium 128 (*) 135 - 145 mEq/L   Potassium 3.6  3.5 - 5.1 mEq/L   Chloride 92 (*) 96 - 112 mEq/L   CO2 25  19 - 32 mEq/L   Glucose, Bld 109 (*) 70 - 99 mg/dL   BUN 7  6 - 23 mg/dL   Creatinine, Ser  0.77  0.50 - 1.10 mg/dL   Calcium 16.1  8.4 - 09.6 mg/dL   Total Protein 7.1  6.0 - 8.3 g/dL   Albumin 3.1 (*) 3.5 - 5.2 g/dL   AST 20  0 - 37 U/L   ALT 26  0 - 35 U/L   Alkaline Phosphatase 349 (*) 39 - 117 U/L   Total Bilirubin 0.7  0.3 - 1.2 mg/dL   GFR calc non Af Amer 89 (*) >90 mL/min   GFR calc Af Amer >90  >90 mL/min  URINALYSIS, ROUTINE W REFLEX MICROSCOPIC      Result Value Range   Color, Urine YELLOW  YELLOW   APPearance CLOUDY (*) CLEAR   Specific Gravity, Urine 1.015  1.005 - 1.030   pH 6.5  5.0 - 8.0   Glucose, UA NEGATIVE  NEGATIVE mg/dL   Hgb urine dipstick NEGATIVE  NEGATIVE   Bilirubin Urine NEGATIVE  NEGATIVE   Ketones, ur NEGATIVE  NEGATIVE mg/dL   Protein, ur 30 (*) NEGATIVE mg/dL   Urobilinogen, UA 1.0  0.0 - 1.0 mg/dL   Nitrite NEGATIVE  NEGATIVE   Leukocytes, UA SMALL (*) NEGATIVE  URINE MICROSCOPIC-ADD ON      Result Value Range   WBC, UA 7-10  <3 WBC/hpf   RBC / HPF 0-2  <3 RBC/hpf   Dg Abd Acute W/chest  11/23/2012   CLINICAL DATA:  Right upper quadrant pain, nausea, abdominal pain  EXAM: ACUTE ABDOMEN SERIES (ABDOMEN 2 VIEW & CHEST 1 VIEW)  COMPARISON:  07/29/2012  FINDINGS: Borderline cardiomegaly again noted. Right IJ Port-A-Cath is unchanged in position. Stable linear scarring in left midlung. No acute infiltrate or pulmonary edema.  There is nonspecific  nonobstructive bowel gas pattern. Prior vertebroplasty lumbar spine. IVC filter in place. Surgical clips are noted in right upper quadrant.  IMPRESSION: Negative abdominal radiographs.  No acute cardiopulmonary disease.   Electronically Signed   By: Natasha Mead   On: 11/23/2012 15:53      MDM  Iv ns bolus. zofran iv.  Reviewed nursing notes and prior charts for additional history.   Pt generally weak, nauseated. Even post zofran, unable to tolerate po fluids.  Pt/spouse reques admit re intractable nausea, dehydration, possible uti.   u cx sent.  hospitalist called to admit/obs.      Suzi Roots, MD 12/17/12 [phone]

## 2012-12-17 NOTE — ED Notes (Signed)
Pt had chemo a week ago, had fluids on Thursday, no energy, not eating , husband states "in and out", sometimes disoriented per husband, no pain, vomited this am

## 2012-12-17 NOTE — H&P (Addendum)
[facility] History and Physical  [name] ZOX:[id number] DOB: Mar 11, 1950 DOA: 12/17/2012  Referring physician: Dr. Denton Lank PCP: [name], MD  Specialists: none  Chief Complaint: weakness, nausea, vomiting and diarrhea  HPI: [name] is a 62 y.o. female has a past medical history significant for metastatic cervical carcinoma, underwent surgery in 2005, was again found in 2011 to have recurrence of her cancer, now with metastasis to the liver, status post radiation therapy and currently undergoing chemotherapy (her oncologist is Dr. Welton Flakes, most recent chemotherapy with pemetrexed 12/07/12 followed by neulasta 10/10), atrial fibrillation on chronic anticoagulation, history of DVT, hypertension, chronic pain, GERD, presents to the emergency room with a chief complaint of generalized weakness, poor by mouth intake that has gradually worsened since her last chemotherapy. She was seen by Dr. Welton Flakes in clinic on 12/14/2012, when she IV hydration. She was feeling better after hydration at that time however she has gotten worse at home. She says she's been having 2-3 loose bowel movements per day, (none today), abdominal pain on the right upper quadrant (which is somewhat chronic), poor appetite, poor by mouth intake and severe nausea. She also endorses several emesis episodes, no blood noted. She denies any fever or chills, however states in the last few minutes in the ED she feels "cold". She denies any chest pain. She endorses lightheadedness and dizziness with standing up. She has no dysuria or burning with urination. She denies shortness of breath, denies cough or any other breathing difficulties.  Review of Systems: As per history of present illness, otherwise negative  Past Medical History  Diagnosis Date  . Diastolic dysfunction   . MIGRAINE HEADACHE   . Atrial fibrillation     failed DCCV, CHADs2-prev pradaxa stopped due to hematuria with ureter issues/JJ   . Anemia   . HTN  (hypertension)   . GLAUCOMA   . CARPAL TUNNEL SYNDROME, RIGHT   . Lymphedema     L>R leg from pelvic XRT/scarring  . Hepatic steatosis     CT scan 06/2009, 12/2009  . DVT (deep venous thrombosis) 10/2010    LLE, anticoag resumed  . CHF (congestive heart failure)     1 yr ago per pt  . Arthritis   . GERD (gastroesophageal reflux disease)   . Small kidney     left  . Radiation 10/11/11-11/19/11    5040 cGy in 28 fx's/periaortic  . Radiation 03/16/10-03/25/10    right inguinal node/left external iliac node  . Radiation 06/11/08-07/23/08    6000 cGy left pelvis  . Hyperlipidemia   . Leg swelling     left  . Cervical cancer dx 04/2003, recur 04/2008    s/p debulk (TAHBSO), chemo/XRT; recurrent dz L pelvis dx 2010 and liver met 09/2010 s/p RFA  . Osteoporosis with fracture 2014    compression fx, s/p KP ; started prolia 06/2012- narcotic pain mgmt   Past Surgical History  Procedure Laterality Date  . Ureteral stent placement  2005, 01/2010, 07/2010    L hydro related to cervical ca and XRT  . Oophorectomy  2005  . Cardioversion    . Tonsillectomy    . Cholecystectomy  1984  . Total abdominal hysterectomy  2005  . Carpal tunnel  2009    right  . Ivc filter  04/2011   Social History:  reports that she quit smoking about 28 years ago. She has never used smokeless tobacco. She reports that she does not drink alcohol or use illicit drugs.  Allergies  Allergen Reactions  . Codeine Rash and Other (See Comments)    Breathing issues  . Flecainide Acetate Other (See Comments)    Doesn't remember reaction  . Morphine Other (See Comments)     Hallucinations  . Propafenone Hcl Other (See Comments)    Leg cramps    Family History  Problem Relation Age of Onset  . Lung cancer    . Hypertension    . Heart disease    . Stroke    . Asthma    . Asthma Son   . Cancer Mother     lung  . Stroke Father    Prior to Admission medications   Medication Sig Start Date End Date Taking?  Authorizing Provider  bisacodyl (DULCOLAX) 5 MG EC tablet Take 5 mg by mouth daily as needed for constipation.   Yes Historical Provider, MD  cetirizine (ZYRTEC) 10 MG tablet Take 10 mg by mouth every evening.    Yes Historical Provider, MD  citalopram (CELEXA) 20 MG tablet Take 20 mg by mouth every evening.  10/03/12  Yes Historical Provider, MD  diltiazem (CARDIZEM CD) 180 MG 24 hr capsule Take 1 capsule (180 mg total) by mouth daily. TAke 2-3 hours before of after Metoprolol 11/28/12  Yes Zannie Cove, MD  fentaNYL (DURAGESIC - DOSED MCG/HR) 100 MCG/HR Place 1 patch (100 mcg total) onto the skin every 3 (three) days. Total dose 125mg  11/17/12  Yes Nicki Reaper, NP  ferrous fumarate (HEMOCYTE - 106 MG FE) 325 (106 FE) MG TABS Take 1 tablet (106 mg of iron total) by mouth daily. 08/04/12  Yes Laveda Norman, MD  folic acid (FOLVITE) 1 MG tablet Take 1 mg by mouth every morning. 07/11/12  Yes Conni Slipper, PA-C  HYDROmorphone (DILAUDID) 4 MG tablet Take 1 tablet every 4 hours as needed for pain 10/17/12  Yes Illa Level, NP  ibuprofen (ADVIL,MOTRIN) 200 MG tablet Take 600 mg by mouth 3 (three) times daily as needed for pain. Pain   Yes Historical Provider, MD  lidocaine-prilocaine (EMLA) cream Apply 1 application topically daily as needed (for port access).   Yes Historical Provider, MD  LORazepam (ATIVAN) 1 MG tablet Take 1 tablet (1 mg total) by mouth every 6 (six) hours as needed (for nausea (may put under tongue)). 11/16/12  Yes Victorino December, MD  metoprolol (LOPRESSOR) 25 MG tablet Take 3 tablets (75 mg total) by mouth 2 (two) times daily. 11/28/12  Yes Zannie Cove, MD  Nutritional Supplements (CARNATION INSTANT BREAKFAST PO) Take 1 Can by mouth 4 (four) times daily as needed (for meals).   Yes Historical Provider, MD  omeprazole (PRILOSEC) 20 MG capsule Take 20 mg by mouth every morning.    Yes Historical Provider, MD  ondansetron (ZOFRAN) 8 MG tablet Take 1 tablet (8 mg total) by mouth  every 8 (eight) hours as needed for nausea. 11/16/12  Yes Victorino December, MD  polyethylene glycol (MIRALAX / GLYCOLAX) packet Take 17 g by mouth 2 (two) times daily as needed (constipation).    Yes Historical Provider, MD  PRESCRIPTION MEDICATION Inject into the vein every 21 ( twenty-one) days. PEMEtrexed (ALIMTA) 950 mg in sodium chloride 0.9 % 100 mL chemo infusion 500 mg/m2  1.91 m2 (Treatment Plan Actual) md Memory Argue Historical Provider, MD  protein supplement (UNJURY CHICKEN SOUP) POWD Take 2 oz by mouth 4 (four) times daily as needed (for meals).   Yes Historical Provider, MD  Rivaroxaban (XARELTO) 20 MG TABS Take 1 tablet (20 mg total) by mouth every evening. 09/13/12  Yes Newt Lukes, MD  sodium chloride (OCEAN) 0.65 % nasal spray Place 1 spray into the nose daily as needed for congestion. Allergies   Yes Historical Provider, MD  triamcinolone (NASACORT) 55 MCG/ACT nasal inhaler Place 2 sprays into the nose daily as needed (allergies or congestion).    Yes Historical Provider, MD  zolpidem (AMBIEN) 5 MG tablet Take 1 tablet (5 mg total) by mouth at bedtime as needed for sleep. 02/18/12  Yes Lennis Buzzy Han, MD   Physical Exam: Filed Vitals:   12/17/12 1207  BP: 128/84  Pulse: 108  Temp: 97.5 F (36.4 C)  TempSrc: Oral  Resp: 18  SpO2: 96%     General:  No apparent distress, appears frail and weak  Eyes: PERRL, EOMI, no scleral icterus  ENT: moist oropharynx  Neck: supple, no JVD  Cardiovascular: regular rate without MRG; 2+ peripheral pulses  Respiratory: CTA biL, good air movement without wheezing, rhonchi or crackled  Abdomen: soft, tender to palpation RUQ, positive bowel sounds, no guarding, no rebound  Skin: no rashes  Musculoskeletal: no peripheral edema  Psychiatric: normal mood and affect  Neurologic: non-focal  Labs on Admission:  Basic Metabolic Panel:  Recent Labs Lab 12/17/12 1345  NA 128*  K 3.6  CL 92*  CO2 25  GLUCOSE 109*  BUN 7   CREATININE 0.77  CALCIUM 10.1   Liver Function Tests:  Recent Labs Lab 12/17/12 1345  AST 20  ALT 26  ALKPHOS 349*  BILITOT 0.7  PROT 7.1  ALBUMIN 3.1*   CBC:  Recent Labs Lab 12/17/12 1345  WBC 9.9  HGB 9.5*  HCT 27.9*  MCV 85.1  PLT 162   EKG: Independently reviewed. A fib  Assessment/Plan Principal Problem:   Nausea vomiting and diarrhea Active Problems:   HYPERTENSION   Atrial fibrillation   Cervical cancer   Anemia of chronic disease   Hyponatremia   Protein-calorie malnutrition, severe   Dehydration  Intractable Nausea/vomiting, with diarrhea and poor po intake resulting in generalized weakness. Possibly related to chemotherapy. IV hydration, closely monitor. Symptomatic control, will start clears and advance her diet as tolerated. - she had a TSH of 3.3 just 3-4 weeks ago - will check am cortisol level. She is receiving dexamethasone with her chemo, most recent dose was 10/9, she got 10 mg.  Hyponatremia - this is most likely in the setting of dehydration. She received 1000 cc bolus in the ED, will start NS at 75 cc/h. Recheck Sodium levels tonight and in am. Will obtain urine sodium, osmolality and serum osmolality.  Chronic pain - will start her home pain regimen. She mentions that her fentanyl patch has been increased to 125 mcg, will start that.  Anemia of chronic disease - Hb stable, no evidence of bleeding. Monitor. Continue her iron supplements. S/p Neulasta 10/10. Platelets normal today.  Protein calorie malnutrition - obtain nutrition consult.  Atrial fibrillation - rate controlled currently, continue her Diltiazem, Metoprolol and Xarelto. Admit patient on telemetry.  Elevated alkaline phosphatase - likely from liver mets.  History of DVT - On Xarelto Possible UTI - borderline UA, without symptoms, afebrile. S/p Ceftriaxone in the ED. Doubt true infection, will hold further antibiotics.   Diet: clears, advance as tolerated Fluids: NS at 75  cc/h DVT Prophylaxis: Xarelto  Code Status: Full  Family Communication: husband bedside  Disposition Plan: inpatient, home when ready  Time spent: 19  Costin M. Elvera Lennox, MD [facility] Pager [phone]  If 7PM-7AM, please contact night-coverage [email or web address] Password [email or web address] 12/17/2012, 4:52 PM

## 2012-12-18 ENCOUNTER — Encounter (HOSPITAL_COMMUNITY): Payer: Self-pay | Admitting: *Deleted

## 2012-12-18 DIAGNOSIS — R112 Nausea with vomiting, unspecified: Secondary | ICD-10-CM

## 2012-12-18 DIAGNOSIS — E860 Dehydration: Secondary | ICD-10-CM

## 2012-12-18 DIAGNOSIS — I4891 Unspecified atrial fibrillation: Secondary | ICD-10-CM

## 2012-12-18 DIAGNOSIS — R197 Diarrhea, unspecified: Secondary | ICD-10-CM

## 2012-12-18 LAB — BASIC METABOLIC PANEL
CO2: 24 mEq/L (ref 19–32)
Calcium: 9.3 mg/dL (ref 8.4–10.5)
Creatinine, Ser: 0.69 mg/dL (ref 0.50–1.10)
GFR calc Af Amer: >90 mL/min (ref >90)
GFR calc non Af Amer: >90 mL/min (ref >90)
Potassium: 3.9 mEq/L (ref 3.5–5.1)
Sodium: 132 mEq/L — ABNORMAL LOW (ref 135–145)

## 2012-12-18 LAB — CBC
HCT: 26.5 % — ABNORMAL LOW (ref 36.0–46.0)
MCHC: 32.8 g/dL (ref 30.0–36.0)
MCV: 86.9 fL (ref 78.0–100.0)
RBC: 3.05 MIL/uL — ABNORMAL LOW (ref 3.87–5.11)
RDW: 18.3 % — ABNORMAL HIGH (ref 11.5–15.5)
WBC: 8.9 10*3/uL (ref 4.0–10.5)

## 2012-12-18 LAB — COMPREHENSIVE METABOLIC PANEL
Albumin: 2.8 g/dL — ABNORMAL LOW (ref 3.5–5.2)
Alkaline Phosphatase: 338 U/L — ABNORMAL HIGH (ref 39–117)
BUN: 5 mg/dL — ABNORMAL LOW (ref 6–23)
Calcium: 9.6 mg/dL (ref 8.4–10.5)
Creatinine, Ser: 0.76 mg/dL (ref 0.50–1.10)
GFR calc Af Amer: >90 mL/min (ref >90)
Glucose, Bld: 108 mg/dL — ABNORMAL HIGH (ref 70–99)
Potassium: 4 mEq/L (ref 3.5–5.1)
Total Protein: 6.3 g/dL (ref 6.0–8.3)

## 2012-12-18 LAB — GLUCOSE, CAPILLARY: Glucose-Capillary: 101 mg/dL — ABNORMAL HIGH (ref 70–99)

## 2012-12-18 LAB — URINE CULTURE: Colony Count: NO GROWTH

## 2012-12-18 LAB — OSMOLALITY, URINE: Osmolality, Ur: 188 mOsm/kg — ABNORMAL LOW (ref 390–1090)

## 2012-12-18 MED ORDER — KCL IN DEXTROSE-NACL 20-5-0.9 MEQ/L-%-% IV SOLN
INTRAVENOUS | Status: DC
  Administered 2012-12-18 – 2012-12-19 (×2): via INTRAVENOUS
  Filled 2012-12-18 (×3): qty 1000

## 2012-12-18 MED ORDER — UNJURY CHOCOLATE CLASSIC POWDER
2.0000 [oz_av] | ORAL | Status: DC
  Administered 2012-12-19: 09:00:00 2 [oz_av] via ORAL
  Filled 2012-12-18 (×2): qty 27

## 2012-12-18 MED ORDER — LEVOFLOXACIN 500 MG PO TABS
500.0000 mg | ORAL_TABLET | Freq: Every day | ORAL | Status: DC
  Administered 2012-12-18 – 2012-12-19 (×2): 500 mg via ORAL
  Filled 2012-12-18 (×3): qty 1

## 2012-12-18 MED ORDER — SODIUM CHLORIDE 0.9 % IJ SOLN
10.0000 mL | INTRAMUSCULAR | Status: DC | PRN
  Administered 2012-12-18 – 2012-12-19 (×3): 10 mL

## 2012-12-18 MED ORDER — UNJURY CHICKEN SOUP POWDER
2.0000 [oz_av] | ORAL | Status: DC
  Administered 2012-12-19: 2 [oz_av] via ORAL
  Filled 2012-12-18 (×2): qty 27

## 2012-12-18 NOTE — Progress Notes (Signed)
[facility] PROGRESS NOTE  [name] WJX:[id number] DOB: May 23, 1950 DOA: 12/17/2012 PCP: [name], MD   Assessment/Plan: 62 y.o. female has a past medical history significant for metastatic cervical carcinoma, underwent surgery in 2005, was again found in 2011 to have recurrence of her cancer, now with metastasis to the liver, status post radiation therapy and currently undergoing chemotherapy (her oncologist is Dr. Welton Flakes, most recent chemotherapy with pemetrexed 12/07/12 followed by neulasta 10/10), atrial fibrillation on chronic anticoagulation, history of DVT, hypertension, chronic pain, GERD, presents to the emergency room with a chief complaint of generalized weakness, poor by mouth intake that has gradually worsened since her last chemotherapy. She was seen by Dr. Welton Flakes in clinic on 12/14/2012, when she IV hydration. She was feeling better after hydration at that time however she has gotten worse at home presented with nausea, vomiting, dehydration, dizziness with probable UTI    1. Intractable Nausea/vomiting, with diarrhea and poor po intake resulting in generalized weakness. Likely related to chemotherapy with probable UTI  -still dizzy, episodes of hypo tension;  cont IV hydration, closely monitor. advance her diet as tolerated.   2. Hyponatremia - this is most likely in the setting of dehydration. Cont IVF  3. Chronic pain - will start her home pain regimen. She mentions that her fentanyl patch has been increased to 125 mcg, will start that.   4. 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 Cont iron, supplements. S/p Neulasta 10/10.   5. Protein calorie malnutrition - obtain nutrition consult.   5.Atrial fibrillation - rate controlled currently, continue her Diltiazem, Metoprolol and Xarelto.  6. Elevated alkaline phosphatase - likely from liver mets.   7. History of DVT - On Xarelto   8. Possible UTI - borderline UA,  S/p Ceftriaxone in the ED. PO levoflox short course       Code Status: full  Family Communication: none at the bedside  (indicate person spoken with, relationship, and if by phone, the number) Disposition Plan: home likely in AM tomorrow    Consultants:  None   Procedures:  CXR  Antibiotics:  levfloxacin 10/20<<< (indicate start date, and stop date if known)  HPI/Subjective: Feels better   Objective: Filed Vitals:   12/18/12 0511  BP: 94/50  Pulse: 79  Temp: 97.9 F (36.6 C)  Resp: 20    Intake/Output Summary (Last 24 hours) at 12/18/12 0846 Last data filed at 12/18/12 0612  Gross per 24 hour  Intake    900 ml  Output   2300 ml  Net  -1400 ml   Filed Weights   12/17/12 1743  Weight: 74.345 kg (163 lb 14.4 oz)    Exam:   General:  Alert   Cardiovascular: s1,s2 rrr  Respiratory: cta BL   Abdomen: soft, nt, nd   Musculoskeletal: no edema    Data Reviewed: Basic Metabolic Panel:  Recent Labs Lab 12/17/12 1345 12/18/12 0010 12/18/12 0620  NA 128* 132* 135  K 3.6 3.9 4.0  CL 92* 100 101  CO2 25 24 24   GLUCOSE 109* 115* 108*  BUN 7 4* 5*  CREATININE 0.77 0.69 0.76  CALCIUM 10.1 9.3 9.6   Liver Function Tests:  Recent Labs Lab 12/17/12 1345 12/18/12 0620  AST 20 20  ALT 26 22  ALKPHOS 349* 338*  BILITOT 0.7 0.5  PROT 7.1 6.3  ALBUMIN 3.1* 2.8*   No results found for this basename: LIPASE, AMYLASE,  in the  last 168 hours No results found for this basename: AMMONIA,  in the last 168 hours CBC:  Recent Labs Lab 12/17/12 1345 12/18/12 0620  WBC 9.9 8.9  HGB 9.5* 8.7*  HCT 27.9* 26.5*  MCV 85.1 86.9  PLT 162 184   Cardiac Enzymes: No results found for this basename: CKTOTAL, CKMB, CKMBINDEX, TROPONINI,  in the last 168 hours BNP (last 3 results) No results found for this basename: PROBNP,  in the last 8760 hours CBG:  Recent Labs Lab 12/18/12 0739  GLUCAP 101*    No results found for this or any previous visit  (from the past 240 hour(s)).   Studies: No results found.  Scheduled Meds: . cefTRIAXone (ROCEPHIN)  IV  1 g Intravenous Q24H  . citalopram  20 mg Oral QPM  . diltiazem  180 mg Oral Q1200  . fentaNYL  125 mcg Transdermal Q72H  . ferrous fumarate  1 tablet Oral Daily  . folic acid  1 mg Oral q morning - 10a  . metoprolol  75 mg Oral BID  . pantoprazole  40 mg Oral Daily  . Rivaroxaban  20 mg Oral QPM  . sodium chloride  3 mL Intravenous Q12H   Continuous Infusions: . sodium chloride 75 mL/hr at 12/17/12 1700    Principal Problem:   Nausea vomiting and diarrhea Active Problems:   HYPERTENSION   Atrial fibrillation   Cervical cancer   Anemia of chronic disease   Hyponatremia   Protein-calorie malnutrition, severe   Dehydration   Alkaline phosphatase elevation    Time spent: > 35 minutes     Esperanza Sheets  [facility] Pager [phone]. If 7PM-7AM, please contact night-coverage at [email or web address], password [email or web address] 12/18/2012, 8:46 AM  LOS: 1 day

## 2012-12-18 NOTE — Progress Notes (Signed)
INITIAL NUTRITION ASSESSMENT  DOCUMENTATION CODES Per approved criteria  -Non-severe (moderate) malnutrition in the context of chronic illness -Obesity Unspecified  Pt meets criteria for Moderate MALNUTRITION in the context of Chronic Illness as evidenced by 8% wt loss in less than 3 months and estimated energy intake <75% of estimated energy needs for > 1 month.   INTERVENTION: Provide Network engineer and snacks BID Provide Unjury Protein Shakes BID Encourage PO intake  NUTRITION DIAGNOSIS: Inadequate oral intake related to nausea/poor appetite as evidenced by 8% wt loss in less than 3 months.  Goal: Pt to meet >/= 90% of their estimated nutrition needs   Monitor:  PO intake Weight Labs  Reason for Assessment: Consult/ MST  62 y.o. female  Admitting Dx: Nausea vomiting and diarrhea  ASSESSMENT: 62 y.o. female has a past medical history significant for metastatic cervical carcinoma, underwent surgery in 2005, was again found in 2011 to have recurrence of her cancer, now with metastasis to the liver, status post radiation therapy and currently undergoing chemotherapy (her oncologist is Dr. Welton Flakes, most recent chemotherapy with pemetrexed 12/07/12 followed by neulasta 10/10), atrial fibrillation on chronic anticoagulation, history of DVT, hypertension, chronic pain, GERD, presents to the emergency room with a chief complaint of generalized weakness, poor by mouth intake that has gradually worsened since her last chemotherapy.  Pt reports that she was drinking [employer] 3 to 4 times daily up until last week, her nausea got worse and she stopped drinking nutritional supplements. Pt states food just doesn't appeal to her and it doesn't taste good. Pt also reports that smells of food have been making her nauseous.   Height: Ht Readings from Last 1 Encounters:  12/17/12 5' 1.5" (1.562 m)    Weight: Wt Readings from Last 1 Encounters:  12/17/12 163 lb 14.4  oz (74.345 kg)    Ideal Body Weight: 108 lbs  % Ideal Body Weight: 151%  Wt Readings from Last 10 Encounters:  12/17/12 163 lb 14.4 oz (74.345 kg)  12/14/12 162 lb (73.483 kg)  12/07/12 174 lb 11.2 oz (79.243 kg)  11/28/12 173 lb 1.6 oz (78.518 kg)  11/23/12 163 lb 11.2 oz (74.254 kg)  11/16/12 175 lb 3.2 oz (79.47 kg)  11/02/12 171 lb 6.4 oz (77.747 kg)  10/26/12 175 lb 4.8 oz (79.516 kg)  10/03/12 178 lb 4.8 oz (80.876 kg)  09/15/12 181 lb 9.6 oz (82.373 kg)    Usual Body Weight: 198 lbs  % Usual Body Weight: 82%  BMI:  Body mass index is 30.47 kg/(m^2).  Estimated Nutritional Needs: Kcal: 1700-1900 Protein: 80-90 grams Fluid: 2.2 L/day  Skin: WDL  Diet Order: Cardiac  EDUCATION NEEDS: -No education needs identified at this time   Intake/Output Summary (Last 24 hours) at 12/18/12 1154 Last data filed at 12/18/12 0612  Gross per 24 hour  Intake    900 ml  Output   2300 ml  Net  -1400 ml    Last BM: PTA  Labs:   Recent Labs Lab 12/17/12 1345 12/18/12 0010 12/18/12 0620  NA 128* 132* 135  K 3.6 3.9 4.0  CL 92* 100 101  CO2 25 24 24   BUN 7 4* 5*  CREATININE 0.77 0.69 0.76  CALCIUM 10.1 9.3 9.6  GLUCOSE 109* 115* 108*    CBG (last 3)   Recent Labs  12/18/12 0739  GLUCAP 101*    Scheduled Meds: . citalopram  20 mg Oral QPM  . diltiazem  180 mg  Oral Q1200  . fentaNYL  125 mcg Transdermal Q72H  . ferrous fumarate  1 tablet Oral Daily  . folic acid  1 mg Oral q morning - 10a  . levofloxacin  500 mg Oral Daily  . metoprolol  75 mg Oral BID  . pantoprazole  40 mg Oral Daily  . Rivaroxaban  20 mg Oral QPM  . sodium chloride  3 mL Intravenous Q12H    Continuous Infusions: . dextrose 5 % and 0.9 % NaCl with KCl 20 mEq/L 75 mL/hr at 12/18/12 1020    Past Medical History  Diagnosis Date  . Diastolic dysfunction   . MIGRAINE HEADACHE   . Atrial fibrillation     failed DCCV, CHADs2-prev pradaxa stopped due to hematuria with ureter  issues/JJ   . Anemia   . HTN (hypertension)   . GLAUCOMA   . CARPAL TUNNEL SYNDROME, RIGHT   . Lymphedema     L>R leg from pelvic XRT/scarring  . Hepatic steatosis     CT scan 06/2009, 12/2009  . DVT (deep venous thrombosis) 10/2010    LLE, anticoag resumed  . CHF (congestive heart failure)     1 yr ago per pt  . Arthritis   . GERD (gastroesophageal reflux disease)   . Small kidney     left  . Radiation 10/11/11-11/19/11    5040 cGy in 28 fx's/periaortic  . Radiation 03/16/10-03/25/10    right inguinal node/left external iliac node  . Radiation 06/11/08-07/23/08    6000 cGy left pelvis  . Hyperlipidemia   . Leg swelling     left  . Cervical cancer dx 04/2003, recur 04/2008    s/p debulk (TAHBSO), chemo/XRT; recurrent dz L pelvis dx 2010 and liver met 09/2010 s/p RFA  . Osteoporosis with fracture 2014    compression fx, s/p KP ; started prolia 06/2012- narcotic pain mgmt    Past Surgical History  Procedure Laterality Date  . Ureteral stent placement  2005, 01/2010, 07/2010    L hydro related to cervical ca and XRT  . Oophorectomy  2005  . Cardioversion    . Tonsillectomy    . Cholecystectomy  1984  . Total abdominal hysterectomy  2005  . Carpal tunnel  2009    right  . Ivc filter  04/2011    Ian Malkin RD, LDN Inpatient Clinical Dietitian Pager: [phone] After Hours Pager: [phone]

## 2012-12-19 ENCOUNTER — Encounter: Payer: Self-pay | Admitting: Oncology

## 2012-12-19 DIAGNOSIS — E440 Moderate protein-calorie malnutrition: Secondary | ICD-10-CM | POA: Insufficient documentation

## 2012-12-19 LAB — GLUCOSE, CAPILLARY: Glucose-Capillary: 106 mg/dL — ABNORMAL HIGH (ref 70–99)

## 2012-12-19 MED ORDER — HEPARIN SOD (PORK) LOCK FLUSH 100 UNIT/ML IV SOLN
500.0000 [IU] | INTRAVENOUS | Status: AC | PRN
  Administered 2012-12-19: 12:00:00 500 [IU]

## 2012-12-19 MED ORDER — LEVOFLOXACIN 500 MG PO TABS: 500.0000 mg | ORAL_TABLET | Freq: Every day | ORAL | Status: DC

## 2012-12-19 MED ORDER — PANTOPRAZOLE SODIUM 40 MG PO TBEC: 40.0000 mg | DELAYED_RELEASE_TABLET | Freq: Every day | ORAL | Status: DC

## 2012-12-19 NOTE — Discharge Summary (Signed)
Physician Discharge Summary  [name] [id number] DOB: 11-25-1950 DOA: 12/17/2012  PCP: [name], MD  Admit date: 12/17/2012 Discharge date: 12/19/2012  Time spent: >35 minutes  Recommendations for Outpatient Follow-up:  F/u with PCP in 12 weeks F/u with oncology as scheduled  Discharge Diagnoses:  Principal Problem:   Nausea vomiting and diarrhea Active Problems:   HYPERTENSION   Atrial fibrillation   Cervical cancer   Anemia of chronic disease   Hyponatremia   Protein-calorie malnutrition, severe   Dehydration   Alkaline phosphatase elevation   Malnutrition of moderate degree   Discharge Condition: stable   Diet recommendation: heart ehalthy   Filed Weights   12/17/12 1743  Weight: 74.345 kg (163 lb 14.4 oz)    History of present illness:  63 y.o. female has a past medical history significant for metastatic cervical carcinoma, underwent surgery in 2005, was again found in 2011 to have recurrence of her cancer, now with metastasis to the liver, status post radiation therapy and currently undergoing chemotherapy (her oncologist is Dr. Welton Flakes, most recent chemotherapy with pemetrexed 12/07/12 followed by neulasta 10/10), atrial fibrillation on chronic anticoagulation, history of DVT, hypertension, chronic pain, GERD, presents to the emergency room with a chief complaint of generalized weakness, poor by mouth intake that has gradually worsened since her last chemotherapy. She was seen by Dr. Welton Flakes in clinic on 12/14/2012, when she IV hydration. She was feeling better after hydration at that time however she has gotten worse at home presented with nausea, vomiting, dehydration, dizziness with probable UTI   Hospital Course:  1. Intractable Nausea/vomiting, with diarrhea and poor po intake resulting in generalized weakness. Likely related to chemotherapy with probable UTI  -improved on IV hydration,tolerating diet; cont outpatient supportive care  2. Hyponatremia -  this is most likely in the setting of dehydration. Improved   3. Chronic pain - cont home pain regimen. Cont bowel regimen  4. 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 Cont iron, supplements. S/p Neulasta 10/10.  5.Atrial fibrillation - rate controlled currently, continue her Diltiazem, Metoprolol and Xarelto.  6. Elevated alkaline phosphatase - likely from liver mets.  7. History of DVT - On Xarelto  8. Possible UTI - borderline UA, S/p Ceftriaxone in the ED. PO levoflox short course     Procedures:  CXR (i.e. Studies not automatically included, echos, thoracentesis, etc; not x-rays)  Consultations:  None   Discharge Exam: Filed Vitals:   12/19/12 0928  BP: 102/62  Pulse:   Temp:   Resp:     General: alert Cardiovascular: s1,s2 rrr Respiratory: cta BL   Discharge Instructions  Discharge Orders   Future Appointments Provider Department Dept Phone   12/28/2012 8:30 AM Delcie Roch [facility] [phone]   12/28/2012 9:00 AM Victorino December, MD [facility] [phone]   12/28/2012 10:00 AM [facility] [place] [facility] [phone]   12/29/2012 9:00 AM [facility] Inj Nurse [place] [facility] [phone]   Future Orders Complete By Expires   Diet - low sodium heart healthy  As directed    Discharge instructions  As directed    Comments:     Please follow up with primary care doctor in 1-2 weeks   Increase activity slowly  As directed        Medication List    STOP taking these medications  omeprazole 20 MG capsule  Commonly known as:  PRILOSEC  Replaced by:  pantoprazole 40 MG tablet      TAKE these medications       bisacodyl 5 MG EC tablet  Commonly known as:  DULCOLAX  Take 5 mg by mouth daily as needed for constipation.     CARNATION INSTANT BREAKFAST PO  Take 1 Can by mouth 4  (four) times daily as needed (for meals).     cetirizine 10 MG tablet  Commonly known as:  ZYRTEC  Take 10 mg by mouth every evening.     citalopram 20 MG tablet  Commonly known as:  CELEXA  Take 20 mg by mouth every evening.     diltiazem 180 MG 24 hr capsule  Commonly known as:  CARDIZEM CD  Take 1 capsule (180 mg total) by mouth daily. TAke 2-3 hours before of after Metoprolol     fentaNYL 100 MCG/HR  Commonly known as:  DURAGESIC - dosed mcg/hr  Place 1 patch (100 mcg total) onto the skin every 3 (three) days. Total dose 125mg      ferrous fumarate 325 (106 FE) MG Tabs tablet  Commonly known as:  HEMOCYTE - 106 mg FE  Take 1 tablet (106 mg of iron total) by mouth daily.     folic acid 1 MG tablet  Commonly known as:  FOLVITE  Take 1 mg by mouth every morning.     HYDROmorphone 4 MG tablet  Commonly known as:  DILAUDID  Take 1 tablet every 4 hours as needed for pain     ibuprofen 200 MG tablet  Commonly known as:  ADVIL,MOTRIN  Take 600 mg by mouth 3 (three) times daily as needed for pain. Pain     levofloxacin 500 MG tablet  Commonly known as:  LEVAQUIN  Take 1 tablet (500 mg total) by mouth daily.     lidocaine-prilocaine cream  Commonly known as:  EMLA  Apply 1 application topically daily as needed (for port access).     LORazepam 1 MG tablet  Commonly known as:  ATIVAN  Take 1 tablet (1 mg total) by mouth every 6 (six) hours as needed (for nausea (may put under tongue)).     metoprolol tartrate 25 MG tablet  Commonly known as:  LOPRESSOR  Take 3 tablets (75 mg total) by mouth 2 (two) times daily.     ondansetron 8 MG tablet  Commonly known as:  ZOFRAN  Take 1 tablet (8 mg total) by mouth every 8 (eight) hours as needed for nausea.     pantoprazole 40 MG tablet  Commonly known as:  PROTONIX  Take 1 tablet (40 mg total) by mouth daily.     polyethylene glycol packet  Commonly known as:  MIRALAX / GLYCOLAX  Take 17 g by mouth 2 (two) times daily as  needed (constipation).     PRESCRIPTION MEDICATION  - Inject into the vein every 21 ( twenty-one) days. PEMEtrexed (ALIMTA) 950 mg in sodium chloride 0.9 % 100 mL chemo infusion 500 mg/m2  1.91 m2 (Treatment Plan Actual)  - md Welton Flakes     protein supplement Powd  Commonly known as:  UNJURY CHICKEN SOUP  Take 2 oz by mouth 4 (four) times daily as needed (for meals).     Rivaroxaban 20 MG Tabs tablet  Commonly known as:  XARELTO  Take 1 tablet (20 mg total) by mouth every evening.     sodium chloride 0.65 % nasal spray  Commonly known as:  OCEAN  Place 1 spray into the nose daily as needed for congestion. Allergies     triamcinolone 55 MCG/ACT nasal inhaler  Commonly known as:  NASACORT  Place 2 sprays into the nose daily as needed (allergies or congestion).     zolpidem 5 MG tablet  Commonly known as:  AMBIEN  Take 1 tablet (5 mg total) by mouth at bedtime as needed for sleep.       Allergies  Allergen Reactions  . Codeine Rash and Other (See Comments)    Breathing issues  . Flecainide Acetate Other (See Comments)    Doesn't remember reaction  . Morphine Other (See Comments)     Hallucinations  . Propafenone Hcl Other (See Comments)    Leg cramps       Follow-up Information   Follow up with [name], MD In 2 weeks.   Specialty:  Internal Medicine   Contact information:   [address] [place] [address] [phone]        The results of significant diagnostics from this hospitalization (including imaging, microbiology, ancillary and laboratory) are listed below for reference.    Significant Diagnostic Studies: Dg Abd Acute W/chest  11/23/2012   CLINICAL DATA:  Right upper quadrant pain, nausea, abdominal pain  EXAM: ACUTE ABDOMEN SERIES (ABDOMEN 2 VIEW & CHEST 1 VIEW)  COMPARISON:  07/29/2012  FINDINGS: Borderline cardiomegaly again noted. Right IJ Port-A-Cath is unchanged in position. Stable linear scarring in left  midlung. No acute infiltrate or pulmonary edema.  There is nonspecific nonobstructive bowel gas pattern. Prior vertebroplasty lumbar spine. IVC filter in place. Surgical clips are noted in right upper quadrant.  IMPRESSION: Negative abdominal radiographs.  No acute cardiopulmonary disease.   Electronically Signed   By: Natasha Mead   On: 11/23/2012 15:53    Microbiology: Recent Results (from the past 240 hour(s))  URINE CULTURE     Status: None   Collection Time    12/17/12  3:11 PM      Result Value Range Status   Specimen Description URINE, CLEAN CATCH   Final   Special Requests NONE   Final   Culture  Setup Time     Final   Value: 12/17/2012 20:42     Performed at [facility] Count     Final   Value: NO GROWTH     Performed at [facility]   Culture     Final   Value: NO GROWTH     Performed at [facility]   Report Status 12/18/2012 FINAL   Final     Labs: Basic Metabolic Panel:  Recent Labs Lab 12/17/12 1345 12/18/12 0010 12/18/12 0620  NA 128* 132* 135  K 3.6 3.9 4.0  CL 92* 100 101  CO2 25 24 24   GLUCOSE 109* 115* 108*  BUN 7 4* 5*  CREATININE 0.77 0.69 0.76  CALCIUM 10.1 9.3 9.6   Liver Function Tests:  Recent Labs Lab 12/17/12 1345 12/18/12 0620  AST 20 20  ALT 26 22  ALKPHOS 349* 338*  BILITOT 0.7 0.5  PROT 7.1 6.3  ALBUMIN 3.1* 2.8*   No results found for this basename: LIPASE, AMYLASE,  in the last 168 hours No results found for this basename: AMMONIA,  in the last 168 hours CBC:  Recent Labs Lab 12/17/12 1345 12/18/12 0620  WBC 9.9 8.9  HGB 9.5* 8.7*  HCT  27.9* 26.5*  MCV 85.1 86.9  PLT 162 184   Cardiac Enzymes: No results found for this basename: CKTOTAL, CKMB, CKMBINDEX, TROPONINI,  in the last 168 hours BNP: BNP (last 3 results) No results found for this basename: PROBNP,  in the last 8760 hours CBG:  Recent Labs Lab 12/18/12 0739 12/19/12 0717  GLUCAP 101* 106*        Signed:  Jonette Mate N  [facility] 12/19/2012, 10:50 AM

## 2012-12-19 NOTE — Progress Notes (Signed)
Put [employer] disability form on nurse's desk °

## 2012-12-19 NOTE — Discharge Summary (Signed)
[name] to be D/C'd Home per MD order. Discussed with the patient and all questions fully answered.    Medication List    STOP taking these medications       omeprazole 20 MG capsule  Commonly known as:  PRILOSEC  Replaced by:  pantoprazole 40 MG tablet      TAKE these medications       bisacodyl 5 MG EC tablet  Commonly known as:  DULCOLAX  Take 5 mg by mouth daily as needed for constipation.     CARNATION INSTANT BREAKFAST PO  Take 1 Can by mouth 4 (four) times daily as needed (for meals).     cetirizine 10 MG tablet  Commonly known as:  ZYRTEC  Take 10 mg by mouth every evening.     citalopram 20 MG tablet  Commonly known as:  CELEXA  Take 20 mg by mouth every evening.     diltiazem 180 MG 24 hr capsule  Commonly known as:  CARDIZEM CD  Take 1 capsule (180 mg total) by mouth daily. TAke 2-3 hours before of after Metoprolol     fentaNYL 100 MCG/HR  Commonly known as:  DURAGESIC - dosed mcg/hr  Place 1 patch (100 mcg total) onto the skin every 3 (three) days. Total dose 125mg      ferrous fumarate 325 (106 FE) MG Tabs tablet  Commonly known as:  HEMOCYTE - 106 mg FE  Take 1 tablet (106 mg of iron total) by mouth daily.     folic acid 1 MG tablet  Commonly known as:  FOLVITE  Take 1 mg by mouth every morning.     HYDROmorphone 4 MG tablet  Commonly known as:  DILAUDID  Take 1 tablet every 4 hours as needed for pain     ibuprofen 200 MG tablet  Commonly known as:  ADVIL,MOTRIN  Take 600 mg by mouth 3 (three) times daily as needed for pain. Pain     levofloxacin 500 MG tablet  Commonly known as:  LEVAQUIN  Take 1 tablet (500 mg total) by mouth daily.     lidocaine-prilocaine cream  Commonly known as:  EMLA  Apply 1 application topically daily as needed (for port access).     LORazepam 1 MG tablet  Commonly known as:  ATIVAN  Take 1 tablet (1 mg total) by mouth every 6 (six) hours as needed (for nausea (may put under tongue)).     metoprolol tartrate  25 MG tablet  Commonly known as:  LOPRESSOR  Take 3 tablets (75 mg total) by mouth 2 (two) times daily.     ondansetron 8 MG tablet  Commonly known as:  ZOFRAN  Take 1 tablet (8 mg total) by mouth every 8 (eight) hours as needed for nausea.     pantoprazole 40 MG tablet  Commonly known as:  PROTONIX  Take 1 tablet (40 mg total) by mouth daily.     polyethylene glycol packet  Commonly known as:  MIRALAX / GLYCOLAX  Take 17 g by mouth 2 (two) times daily as needed (constipation).     PRESCRIPTION MEDICATION  - Inject into the vein every 21 ( twenty-one) days. PEMEtrexed (ALIMTA) 950 mg in sodium chloride 0.9 % 100 mL chemo infusion 500 mg/m2  1.91 m2 (Treatment Plan Actual)  - md Welton Flakes     protein supplement Powd  Commonly known as:  UNJURY CHICKEN SOUP  Take 2 oz by mouth 4 (four) times daily as needed (for meals).  Rivaroxaban 20 MG Tabs tablet  Commonly known as:  XARELTO  Take 1 tablet (20 mg total) by mouth every evening.     sodium chloride 0.65 % nasal spray  Commonly known as:  OCEAN  Place 1 spray into the nose daily as needed for congestion. Allergies     triamcinolone 55 MCG/ACT nasal inhaler  Commonly known as:  NASACORT  Place 2 sprays into the nose daily as needed (allergies or congestion).     zolpidem 5 MG tablet  Commonly known as:  AMBIEN  Take 1 tablet (5 mg total) by mouth at bedtime as needed for sleep.        VVS, Skin clean, dry and intact without evidence of skin break down, no evidence of skin tears noted.  An After Visit Summary was printed and given to the patient.  Patient escorted via WC, and D/C home via private auto.  Beckey Downing F  12/19/2012 1:16 PM

## 2012-12-20 ENCOUNTER — Other Ambulatory Visit: Payer: Self-pay | Admitting: Adult Health

## 2012-12-20 DIAGNOSIS — C539 Malignant neoplasm of cervix uteri, unspecified: Secondary | ICD-10-CM

## 2012-12-21 MED ORDER — HYDROMORPHONE HCL 4 MG PO TABS: ORAL_TABLET | ORAL | Status: DC

## 2012-12-22 ENCOUNTER — Telehealth: Payer: Self-pay

## 2012-12-22 ENCOUNTER — Other Ambulatory Visit: Payer: Self-pay | Admitting: *Deleted

## 2012-12-22 MED ORDER — METOPROLOL TARTRATE 25 MG PO TABS: 75.0000 mg | ORAL_TABLET | Freq: Two times a day (BID) | ORAL | Status: DC

## 2012-12-22 NOTE — Telephone Encounter (Signed)
Ok to refill Metoprolol 75 mg BID # 60 ,2 refills

## 2012-12-22 NOTE — Telephone Encounter (Signed)
Phone call from patient stating she needs a refill on her Metoprolol (she thinks 75 mg BID). She thought there was a dosage change. Please advise if a change or should she just stay on 75 mg BID? She has a follow up appt with Dr Felicity Coyer next week.

## 2012-12-22 NOTE — Telephone Encounter (Signed)
Prescription asent to pharmacy

## 2012-12-26 ENCOUNTER — Encounter: Payer: Self-pay | Admitting: Oncology

## 2012-12-26 NOTE — Progress Notes (Signed)
Faxed disability form to [employer] @ [phone] °

## 2012-12-27 ENCOUNTER — Encounter: Payer: Self-pay | Admitting: Internal Medicine

## 2012-12-27 ENCOUNTER — Ambulatory Visit (INDEPENDENT_AMBULATORY_CARE_PROVIDER_SITE_OTHER): Payer: 59 | Admitting: Internal Medicine

## 2012-12-27 VITALS — BP 110/68 | HR 78 | Temp 98.3°F | Wt 174.8 lb

## 2012-12-27 DIAGNOSIS — I4891 Unspecified atrial fibrillation: Secondary | ICD-10-CM

## 2012-12-27 DIAGNOSIS — IMO0002 Reserved for concepts with insufficient information to code with codable children: Secondary | ICD-10-CM

## 2012-12-27 DIAGNOSIS — M8080XS Other osteoporosis with current pathological fracture, unspecified site, sequela: Secondary | ICD-10-CM

## 2012-12-27 DIAGNOSIS — C539 Malignant neoplasm of cervix uteri, unspecified: Secondary | ICD-10-CM

## 2012-12-27 MED ORDER — FENTANYL 100 MCG/HR TD PT72: 1.0000 | MEDICATED_PATCH | TRANSDERMAL | Status: DC

## 2012-12-27 MED ORDER — DILTIAZEM HCL ER COATED BEADS 180 MG PO CP24: 180.0000 mg | ORAL_CAPSULE | Freq: Every day | ORAL | Status: DC

## 2012-12-27 MED ORDER — FENTANYL 25 MCG/HR TD PT72: 1.0000 | MEDICATED_PATCH | TRANSDERMAL | Status: DC

## 2012-12-27 NOTE — Assessment & Plan Note (Signed)
Dx 04/2003: s/p chemo/debulk/XRT Recurrent dx 04/2009 L pelvic wall and liver met - resumed chemo 09/2010 - 03/05/2011 Trial Alimita 06/2012 caused neutropenic fever (hosp x 1 week for same), side effects intolerable Multiple complications related to same and renal involvement/obstruction - Reviewed hx hematuria with XRT (hx same with ureter lesion during 2011 XRT) - complicated by anticoag  Also DVT LLE 10/2010 and 04/2011 likely related to hypercoagulable state from malignancy - s/p IVC filter 04/2011 Hepatic metastatic lesion RF ablation done 05/07/11 by  interventional radiology  L flank discomfort related to increase LN size on 05/2011 CT and PET 08/2011>  started XRT for same 09/2011 -  Chronic persisting RUQ pain related to liver mets Continue pain mgmt as ongoing Chemo per onc (now Welton Flakes) and follow up gyn onc as planned

## 2012-12-27 NOTE — Progress Notes (Signed)
Pre-visit discussion using our clinic review tool. No additional management support is needed unless otherwise documented below in the visit note.  

## 2012-12-27 NOTE — Assessment & Plan Note (Signed)
L1 and L3 compression fractures 04/2012 Pain mgmt ongoing for same - mgmt of narcotics here - refill today prolia began 07/18/12 -continee q62mo injections -here or onc - next 12/2012

## 2012-12-27 NOTE — Progress Notes (Signed)
Subjective:    Patient ID: [name], female    DOB: [birth year], 62 y.o.   MRN: [id number]  HPI Here for followup -Reviewed interval history and DC summary  Admit date: 12/17/2012  Discharge date: 12/19/2012  Time spent: >35 minutes  Recommendations for Outpatient Follow-up:  F/u with PCP in 12 weeks  F/u with oncology as scheduled  Discharge Diagnoses:  Principal Problem:  Nausea vomiting and diarrhea  Active Problems:  HYPERTENSION  Atrial fibrillation  Cervical cancer  Anemia of chronic disease  Hyponatremia  Protein-calorie malnutrition, severe  Dehydration  Alkaline phosphatase elevation  Malnutrition of moderate degree  Admit date: 11/23/2012  Discharge date: 11/28/2012  Time spent: 45 minutes  Recommendations for Outpatient Follow-up:  1. Dr. Welton Flakes in 1 week Discharge Diagnoses:  Principal Problem:  Atrial fibrillation with RVR  Nausea and vomiting-chemotherapy induced  DEPRESSION  MIGRAINE HEADACHE  HYPERTENSION  Cervical cancer  DVT (deep venous thrombosis)  Anemia of chronic disease  Thrombocytopenia  Nausea vomiting and diarrhea  Dehydration  GERD (gastroesophageal reflux disease)   Past Medical History  Diagnosis Date  . Diastolic dysfunction   . MIGRAINE HEADACHE   . Atrial fibrillation     failed DCCV, CHADs2-prev pradaxa stopped due to hematuria with ureter issues/JJ   . Anemia   . HTN (hypertension)   . GLAUCOMA   . CARPAL TUNNEL SYNDROME, RIGHT   . Lymphedema     L>R leg from pelvic XRT/scarring  . Hepatic steatosis     CT scan 06/2009, 12/2009  . DVT (deep venous thrombosis) 10/2010    LLE, anticoag resumed  . CHF (congestive heart failure)     1 yr ago per pt  . Arthritis   . GERD (gastroesophageal reflux disease)   . Small kidney     left  . Radiation 10/11/11-11/19/11    5040 cGy in 28 fx's/periaortic  . Radiation 03/16/10-03/25/10    right inguinal node/left external iliac node  . Radiation 06/11/08-07/23/08    6000 cGy left  pelvis  . Hyperlipidemia   . Leg swelling     left  . Cervical cancer dx 04/2003, recur 04/2008    s/p debulk (TAHBSO), chemo/XRT; recurrent dz L pelvis dx 2010 and liver met 09/2010 s/p RFA  . Osteoporosis with fracture 2014    compression fx, s/p KP ; started prolia 06/2012- narcotic pain mgmt    Review of Systems  Constitutional: Negative for fever and fatigue.  Gastrointestinal: Negative for bowel incontinence.  Genitourinary: Positive for flank pain. Negative for bladder incontinence.  Musculoskeletal: Positive for back pain and gait problem. Negative for joint swelling and myalgias.  Skin: Negative for rash and wound.  Neurological: Positive for weakness. Negative for numbness and headaches.       Objective:   Physical Exam BP 110/68  Pulse 78  Temp(Src) 98.3 F (36.8 C) (Oral)  Wt 174 lb 12.8 oz (79.289 kg)  BMI 32.5 kg/m2  SpO2 97% Wt Readings from Last 3 Encounters:  12/27/12 174 lb 12.8 oz (79.289 kg)  12/17/12 163 lb 14.4 oz (74.345 kg)  12/14/12 162 lb (73.483 kg)   Constitutional: She is obese, fatigued, and emotional but no acute distress. sitting in wheelchair for support.  Neck: Normal range of motion. Neck supple. No JVD present. No thyromegaly present.  Cardiovascular: Normal rate, irregular rhythm and normal heart sounds.  No murmur heard. No BLE edema. Pulmonary/Chest: Effort normal and breath sounds normal. No respiratory distress. She has no wheezes.  Psychiatric:  She has a dysphoric mood and affect. Her behavior is normal. Judgment and thought content normal.   Lab Results  Component Value Date   WBC 8.9 12/18/2012   HGB 8.7* 12/18/2012   HCT 26.5* 12/18/2012   PLT 184 12/18/2012   GLUCOSE 108* 12/18/2012   ALT 22 12/18/2012   AST 20 12/18/2012   NA 135 12/18/2012   K 4.0 12/18/2012   CL 101 12/18/2012   CREATININE 0.76 12/18/2012   BUN 5* 12/18/2012   CO2 24 12/18/2012   TSH 3.380 11/24/2012   INR 1.29 11/24/2012   HGBA1C 5.2 09/19/2008     US Abdomen Limited  08/15/2012   *RADIOLOGY REPORT*  Clinical Data: Follow-up liver metastases following first treatment of Alimta chemotherapy.  Metastatic cervical carcinoma.  LIMITED ABDOMINAL ULTRASOUND  Comparison:  Abdomen CT dated 07/29/2012.  Findings: Direct comparison is not possible with different modalities.  Currently, three ill-defined masses are visible in the liver.  One is in the central liver on the right, measuring 4.3 x 3.8 x 3.7 cm.  This most likely corresponds to a 4.4 x 4.3 cm mass seen on image number 16 of the most recent CT.  In the posterior right lobe, there is a poorly defined mass measuring approximately 4.3 x 3.7 x 3.4 cm.  This most likely corresponds to a mass previously measuring 4.4 x 3.6 cm on image number 23.  In the medial segment of the left lobe, a poorly defined mass measures approximately 3.3 x 3.1 x 2.8 cm, corresponding to a mass previously measuring 4.4 x 3.1 cm on image number 24.  IMPRESSION: There are three liver masses visible sonographically.  Two of these are not well defined with little gross change in size compared to the most recent CT, as described above.   Original Report Authenticated By: Beckie Salts, M.D.      Assessment & Plan:   See problem list. Medications and labs reviewed today.  Time spent with pt today 25 minutes, greater than 50% time spent counseling patient on recent hospitalization for dehydration related to nausea vomiting and diarrhea with weakness and hyponatremia which has improved following IV fluids. Also reviewed AF, pain management, metastatic cervical cancer treatment and medication reconciliation. Also review of interval medical records

## 2012-12-27 NOTE — Assessment & Plan Note (Signed)
RVR hosp 10/2012 reviewed including subsequent med changes - inc dose metoprolol and added Dilt S/p DCCV 07/2010 - briefly off Pradaxa in 09/2010 Then back on coumadin due LLE DVT 10/2010 Changed to xarelto summer 2013 - continues same now Continue rate control (failed DCCV in 2012, med mgmt) follow up cards as needed

## 2012-12-27 NOTE — Patient Instructions (Signed)
It was good to see you today.  We have reviewed your prior records including labs and tests today  Medications reviewed and updated, no new changes recommended at this time.  Refill on medication(s) as discussed today.  I will continue to fill your pain medication refills at this office -call monthly or as needed  Followup here in 2-3 months, sooner if problems  Will plan for next Prolia injection for your bones to occur after 01/18/2013 (every 6 months) - here or oncology ok!

## 2012-12-28 ENCOUNTER — Ambulatory Visit (HOSPITAL_COMMUNITY)
Admission: RE | Admit: 2012-12-28 | Discharge: 2012-12-28 | Disposition: A | Discharge status: Home / Self Care | Payer: 59 | Source: Ambulatory Visit | Attending: Internal Medicine | Admitting: Internal Medicine

## 2012-12-28 ENCOUNTER — Telehealth: Payer: Self-pay | Admitting: *Deleted

## 2012-12-28 ENCOUNTER — Encounter: Payer: Self-pay | Admitting: Oncology

## 2012-12-28 ENCOUNTER — Telehealth: Payer: Self-pay | Admitting: Oncology

## 2012-12-28 ENCOUNTER — Other Ambulatory Visit: Payer: Self-pay | Admitting: Emergency Medicine

## 2012-12-28 ENCOUNTER — Ambulatory Visit (HOSPITAL_BASED_OUTPATIENT_CLINIC_OR_DEPARTMENT_OTHER): Payer: 59

## 2012-12-28 ENCOUNTER — Other Ambulatory Visit (HOSPITAL_BASED_OUTPATIENT_CLINIC_OR_DEPARTMENT_OTHER): Payer: 59 | Admitting: Lab

## 2012-12-28 ENCOUNTER — Other Ambulatory Visit: Payer: Self-pay | Admitting: *Deleted

## 2012-12-28 ENCOUNTER — Other Ambulatory Visit: Payer: 59 | Admitting: Lab

## 2012-12-28 ENCOUNTER — Ambulatory Visit (HOSPITAL_BASED_OUTPATIENT_CLINIC_OR_DEPARTMENT_OTHER): Payer: 59 | Admitting: Oncology

## 2012-12-28 VITALS — BP 91/60 | HR 80 | Temp 98.2°F | Resp 17 | Ht 61.5 in | Wt 176.9 lb

## 2012-12-28 DIAGNOSIS — Z86718 Personal history of other venous thrombosis and embolism: Secondary | ICD-10-CM | POA: Insufficient documentation

## 2012-12-28 DIAGNOSIS — M79609 Pain in unspecified limb: Secondary | ICD-10-CM

## 2012-12-28 DIAGNOSIS — C787 Secondary malignant neoplasm of liver and intrahepatic bile duct: Secondary | ICD-10-CM

## 2012-12-28 DIAGNOSIS — E860 Dehydration: Secondary | ICD-10-CM

## 2012-12-28 DIAGNOSIS — C770 Secondary and unspecified malignant neoplasm of lymph nodes of head, face and neck: Secondary | ICD-10-CM

## 2012-12-28 DIAGNOSIS — C539 Malignant neoplasm of cervix uteri, unspecified: Secondary | ICD-10-CM

## 2012-12-28 DIAGNOSIS — Z5111 Encounter for antineoplastic chemotherapy: Secondary | ICD-10-CM

## 2012-12-28 DIAGNOSIS — R5381 Other malaise: Secondary | ICD-10-CM

## 2012-12-28 DIAGNOSIS — F341 Dysthymic disorder: Secondary | ICD-10-CM

## 2012-12-28 DIAGNOSIS — I82409 Acute embolism and thrombosis of unspecified deep veins of unspecified lower extremity: Secondary | ICD-10-CM

## 2012-12-28 DIAGNOSIS — I82402 Acute embolism and thrombosis of unspecified deep veins of left lower extremity: Secondary | ICD-10-CM

## 2012-12-28 DIAGNOSIS — R609 Edema, unspecified: Secondary | ICD-10-CM | POA: Insufficient documentation

## 2012-12-28 DIAGNOSIS — R630 Anorexia: Secondary | ICD-10-CM

## 2012-12-28 LAB — COMPREHENSIVE METABOLIC PANEL (CC13)
ALT: 131 U/L — ABNORMAL HIGH (ref 0–55)
AST: 129 U/L — ABNORMAL HIGH (ref 5–34)
Albumin: 2.9 g/dL — ABNORMAL LOW (ref 3.5–5.0)
Anion Gap: 10 mEq/L (ref 3–11)
BUN: 15.8 mg/dL (ref 7.0–26.0)
CO2: 25 mEq/L (ref 22–29)
Calcium: 9.6 mg/dL (ref 8.4–10.4)
Chloride: 102 mEq/L (ref 98–109)
Creatinine: 0.9 mg/dL (ref 0.6–1.1)
Sodium: 137 mEq/L (ref 136–145)
Total Bilirubin: 0.62 mg/dL (ref 0.20–1.20)

## 2012-12-28 LAB — CBC WITH DIFFERENTIAL/PLATELET
BASO%: 0.2 % (ref 0.0–2.0)
Eosinophils Absolute: 0.1 10*3/uL (ref 0.0–0.5)
HCT: 27.6 % — ABNORMAL LOW (ref 34.8–46.6)
LYMPH%: 11.9 % — ABNORMAL LOW (ref 14.0–49.7)
MCH: 29.4 pg (ref 25.1–34.0)
MCHC: 31.9 g/dL (ref 31.5–36.0)
MCV: 92.3 fL (ref 79.5–101.0)
MONO#: 1 10*3/uL — ABNORMAL HIGH (ref 0.1–0.9)
NEUT#: 6.3 10*3/uL (ref 1.5–6.5)
NEUT%: 75.5 % (ref 38.4–76.8)
Platelets: 309 10*3/uL (ref 145–400)
RDW: 20.9 % — ABNORMAL HIGH (ref 11.2–14.5)
WBC: 8.4 10*3/uL (ref 3.9–10.3)

## 2012-12-28 MED ORDER — DEXAMETHASONE SODIUM PHOSPHATE 10 MG/ML IJ SOLN
INTRAMUSCULAR | Status: AC
  Filled 2012-12-28: qty 1

## 2012-12-28 MED ORDER — SODIUM CHLORIDE 0.9 % IV SOLN
INTRAVENOUS | Status: DC
  Administered 2012-12-28: 10:00:00 via INTRAVENOUS

## 2012-12-28 MED ORDER — CYANOCOBALAMIN 1000 MCG/ML IJ SOLN
INTRAMUSCULAR | Status: AC
  Filled 2012-12-28: qty 1

## 2012-12-28 MED ORDER — DEXAMETHASONE SODIUM PHOSPHATE 10 MG/ML IJ SOLN
10.0000 mg | Freq: Once | INTRAMUSCULAR | Status: AC
  Administered 2012-12-28: 10 mg via INTRAVENOUS

## 2012-12-28 MED ORDER — ONDANSETRON 8 MG/NS 50 ML IVPB
INTRAVENOUS | Status: AC
  Filled 2012-12-28: qty 8

## 2012-12-28 MED ORDER — ONDANSETRON 8 MG/50ML IVPB (CHCC)
8.0000 mg | Freq: Once | INTRAVENOUS | Status: AC
  Administered 2012-12-28: 8 mg via INTRAVENOUS

## 2012-12-28 MED ORDER — FOLIC ACID 1 MG PO TABS: 1.0000 mg | ORAL_TABLET | Freq: Every morning | ORAL | Status: DC

## 2012-12-28 MED ORDER — CYANOCOBALAMIN 1000 MCG/ML IJ SOLN
1000.0000 ug | Freq: Once | INTRAMUSCULAR | Status: AC
  Administered 2012-12-28: 1000 ug via INTRAMUSCULAR

## 2012-12-28 MED ORDER — SODIUM CHLORIDE 0.9 % IV SOLN: Freq: Once | INTRAVENOUS | Status: DC

## 2012-12-28 MED ORDER — SODIUM CHLORIDE 0.9 % IV SOLN
500.0000 mg/m2 | Freq: Once | INTRAVENOUS | Status: AC
  Administered 2012-12-28: 725 mg via INTRAVENOUS
  Filled 2012-12-28: qty 29

## 2012-12-28 MED ORDER — HEPARIN SOD (PORK) LOCK FLUSH 100 UNIT/ML IV SOLN
500.0000 [IU] | Freq: Once | INTRAVENOUS | Status: AC | PRN
  Administered 2012-12-28: 500 [IU]
  Filled 2012-12-28: qty 5

## 2012-12-28 MED ORDER — SODIUM CHLORIDE 0.9 % IJ SOLN
10.0000 mL | INTRAMUSCULAR | Status: DC | PRN
  Administered 2012-12-28: 10 mL
  Filled 2012-12-28: qty 10

## 2012-12-28 NOTE — Progress Notes (Signed)
VASCULAR LAB PRELIMINARY  PRELIMINARY  PRELIMINARY  PRELIMINARY  Left lower extremity venous duplex completed.    Preliminary report:  Left:  No evidence of DVT, superficial thrombosis, or Baker's cyst.  Alaylah Heatherington, RVS 12/28/2012, 3:27 PM

## 2012-12-28 NOTE — Patient Instructions (Signed)
[facility] Discharge Instructions for Patients Receiving Chemotherapy  Today you received the following chemotherapy agents Alimta  To help prevent nausea and vomiting after your treatment, we encourage you to take your nausea medication as prescribed.   If you develop nausea and vomiting that is not controlled by your nausea medication, call the clinic.   BELOW ARE SYMPTOMS THAT SHOULD BE REPORTED IMMEDIATELY:  *FEVER GREATER THAN 100.5 F  *CHILLS WITH OR WITHOUT FEVER  NAUSEA AND VOMITING THAT IS NOT CONTROLLED WITH YOUR NAUSEA MEDICATION  *UNUSUAL SHORTNESS OF BREATH  *UNUSUAL BRUISING OR BLEEDING  TENDERNESS IN MOUTH AND THROAT WITH OR WITHOUT PRESENCE OF ULCERS  *URINARY PROBLEMS  *BOWEL PROBLEMS  UNUSUAL RASH Items with * indicate a potential emergency and should be followed up as soon as possible.  Feel free to call the clinic you have any questions or concerns. The clinic phone number is [phone].   Dehydration, Adult Dehydration is when you lose more fluids from the body than you take in. Vital organs like the kidneys, brain, and heart cannot function without a proper amount of fluids and salt. Any loss of fluids from the body can cause dehydration.  CAUSES   Vomiting.  Diarrhea.  Excessive sweating.  Excessive urine output.  Fever. SYMPTOMS  Mild dehydration  Thirst.  Dry lips.  Slightly dry mouth. Moderate dehydration  Very dry mouth.  Sunken eyes.  Skin does not bounce back quickly when lightly pinched and released.  Dark urine and decreased urine production.  Decreased tear production.  Headache. Severe dehydration  Very dry mouth.  Extreme thirst.  Rapid, weak pulse (more than 100 beats per minute at rest).  Cold hands and feet.  Not able to sweat in spite of heat and temperature.  Rapid breathing.  Blue lips.  Confusion and lethargy.  Difficulty being awakened.  Minimal urine production.  No  tears. DIAGNOSIS  Your caregiver will diagnose dehydration based on your symptoms and your exam. Blood and urine tests will help confirm the diagnosis. The diagnostic evaluation should also identify the cause of dehydration. TREATMENT  Treatment of mild or moderate dehydration can often be done at home by increasing the amount of fluids that you drink. It is best to drink small amounts of fluid more often. Drinking too much at one time can make vomiting worse. Refer to the home care instructions below. Severe dehydration needs to be treated at the hospital where you will probably be given intravenous (IV) fluids that contain water and electrolytes. HOME CARE INSTRUCTIONS   Ask your caregiver about specific rehydration instructions.  Drink enough fluids to keep your urine clear or pale yellow.  Drink small amounts frequently if you have nausea and vomiting.  Eat as you normally do.  Avoid:  Foods or drinks high in sugar.  Carbonated drinks.  Juice.  Extremely hot or cold fluids.  Drinks with caffeine.  Fatty, greasy foods.  Alcohol.  Tobacco.  Overeating.  Gelatin desserts.  Wash your hands well to avoid spreading bacteria and viruses.  Only take over-the-counter or prescription medicines for pain, discomfort, or fever as directed by your caregiver.  Ask your caregiver if you should continue all prescribed and over-the-counter medicines.  Keep all follow-up appointments with your caregiver. SEEK MEDICAL CARE IF:  You have abdominal pain and it increases or stays in one area (localizes).  You have a rash, stiff neck, or severe headache.  You are irritable, sleepy, or difficult to awaken.  You   are weak, dizzy, or extremely thirsty. SEEK IMMEDIATE MEDICAL CARE IF:   You are unable to keep fluids down or you get worse despite treatment.  You have frequent episodes of vomiting or diarrhea.  You have blood or green matter (bile) in your vomit.  You have blood in  your stool or your stool looks black and tarry.  You have not urinated in 6 to 8 hours, or you have only urinated a small amount of very dark urine.  You have a fever.  You faint. MAKE SURE YOU:   Understand these instructions.  Will watch your condition.  Will get help right away if you are not doing well or get worse. Document Released: 02/15/2005 Document Revised: 05/10/2011 Document Reviewed: 10/05/2010 [employer] Patient Information 2014 [employer].  

## 2012-12-28 NOTE — Progress Notes (Signed)
Chaplain made initial visit. Patient was friendly but reserved. She stated that she was doing okay. Did not seem to have further needs at this time.

## 2012-12-28 NOTE — Telephone Encounter (Signed)
Per staff message and POF I have scheduled appts.  JMW  

## 2012-12-28 NOTE — Telephone Encounter (Signed)
Received call from [place] in vascular lab re:  Pt had her Left Lower Extremity Venous doppler study today -  Results   Negative.   Spoke with Sharyl Nimrod, RN desk nurse for Dr. Welton Flakes.   Called [place] back and informed her that pt could be discharged home.  Confirmed next return appt  For 12/29/12 with pt.

## 2012-12-29 ENCOUNTER — Ambulatory Visit: Payer: 59

## 2012-12-29 ENCOUNTER — Other Ambulatory Visit: Payer: Self-pay | Admitting: *Deleted

## 2012-12-29 ENCOUNTER — Ambulatory Visit (HOSPITAL_BASED_OUTPATIENT_CLINIC_OR_DEPARTMENT_OTHER): Payer: 59

## 2012-12-29 VITALS — BP 120/64 | HR 102 | Temp 98.1°F

## 2012-12-29 DIAGNOSIS — C539 Malignant neoplasm of cervix uteri, unspecified: Secondary | ICD-10-CM

## 2012-12-29 DIAGNOSIS — E860 Dehydration: Secondary | ICD-10-CM

## 2012-12-29 DIAGNOSIS — C787 Secondary malignant neoplasm of liver and intrahepatic bile duct: Secondary | ICD-10-CM

## 2012-12-29 DIAGNOSIS — Z5189 Encounter for other specified aftercare: Secondary | ICD-10-CM

## 2012-12-29 MED ORDER — SODIUM CHLORIDE 0.9 % IJ SOLN
10.0000 mL | INTRAMUSCULAR | Status: DC | PRN
  Administered 2012-12-29: 10 mL via INTRAVENOUS
  Filled 2012-12-29: qty 10

## 2012-12-29 MED ORDER — SODIUM CHLORIDE 0.9 % IV SOLN
INTRAVENOUS | Status: DC
  Administered 2012-12-29: 09:00:00 via INTRAVENOUS

## 2012-12-29 MED ORDER — HEPARIN SOD (PORK) LOCK FLUSH 100 UNIT/ML IV SOLN
500.0000 [IU] | Freq: Once | INTRAVENOUS | Status: AC
  Administered 2012-12-29: 500 [IU] via INTRAVENOUS
  Filled 2012-12-29: qty 5

## 2012-12-29 MED ORDER — PEGFILGRASTIM INJECTION 6 MG/0.6ML
6.0000 mg | Freq: Once | SUBCUTANEOUS | Status: AC
  Administered 2012-12-29: 6 mg via SUBCUTANEOUS
  Filled 2012-12-29: qty 0.6

## 2012-12-29 NOTE — Progress Notes (Signed)
Neulasta injection given to [name] in infusion room while getting fluids.

## 2012-12-29 NOTE — Patient Instructions (Signed)
Dehydration, Adult Dehydration is when you lose more fluids from the body than you take in. Vital organs like the kidneys, brain, and heart cannot function without a proper amount of fluids and salt. Any loss of fluids from the body can cause dehydration.  CAUSES   Vomiting.  Diarrhea.  Excessive sweating.  Excessive urine output.  Fever. SYMPTOMS  Mild dehydration  Thirst.  Dry lips.  Slightly dry mouth. Moderate dehydration  Very dry mouth.  Sunken eyes.  Skin does not bounce back quickly when lightly pinched and released.  Dark urine and decreased urine production.  Decreased tear production.  Headache. Severe dehydration  Very dry mouth.  Extreme thirst.  Rapid, weak pulse (more than 100 beats per minute at rest).  Cold hands and feet.  Not able to sweat in spite of heat and temperature.  Rapid breathing.  Blue lips.  Confusion and lethargy.  Difficulty being awakened.  Minimal urine production.  No tears. DIAGNOSIS  Your caregiver will diagnose dehydration based on your symptoms and your exam. Blood and urine tests will help confirm the diagnosis. The diagnostic evaluation should also identify the cause of dehydration. TREATMENT  Treatment of mild or moderate dehydration can often be done at home by increasing the amount of fluids that you drink. It is best to drink small amounts of fluid more often. Drinking too much at one time can make vomiting worse. Refer to the home care instructions below. Severe dehydration needs to be treated at the hospital where you will probably be given intravenous (IV) fluids that contain water and electrolytes. HOME CARE INSTRUCTIONS   Ask your caregiver about specific rehydration instructions.  Drink enough fluids to keep your urine clear or pale yellow.  Drink small amounts frequently if you have nausea and vomiting.  Eat as you normally do.  Avoid:  Foods or drinks high in sugar.  Carbonated  drinks.  Juice.  Extremely hot or cold fluids.  Drinks with caffeine.  Fatty, greasy foods.  Alcohol.  Tobacco.  Overeating.  Gelatin desserts.  Wash your hands well to avoid spreading bacteria and viruses.  Only take over-the-counter or prescription medicines for pain, discomfort, or fever as directed by your caregiver.  Ask your caregiver if you should continue all prescribed and over-the-counter medicines.  Keep all follow-up appointments with your caregiver. SEEK MEDICAL CARE IF:  You have abdominal pain and it increases or stays in one area (localizes).  You have a rash, stiff neck, or severe headache.  You are irritable, sleepy, or difficult to awaken.  You are weak, dizzy, or extremely thirsty. SEEK IMMEDIATE MEDICAL CARE IF:   You are unable to keep fluids down or you get worse despite treatment.  You have frequent episodes of vomiting or diarrhea.  You have blood or green matter (bile) in your vomit.  You have blood in your stool or your stool looks black and tarry.  You have not urinated in 6 to 8 hours, or you have only urinated a small amount of very dark urine.  You have a fever.  You faint. MAKE SURE YOU:   Understand these instructions.  Will watch your condition.  Will get help right away if you are not doing well or get worse. Document Released: 02/15/2005 Document Revised: 05/10/2011 Document Reviewed: 10/05/2010 [employer] Patient Information 2014 [employer].  

## 2013-01-02 NOTE — Progress Notes (Signed)
OFFICE PROGRESS NOTE  CC**  [name] Paci, MD [address] [facility] [address] [place] [address] Dr. Loree Fee  DIAGNOSIS: 62 year old female with metastatic cervical carcinoma  PRIOR THERAPY:  #1 patient underwent a radical hysterectomy bilateral salpingo-oophorectomy and pelvic lymphadenectomy in March 2005 for his stage IB cervical cancer. Her final pathology showed no residual disease in all lymph nodes were negative.  #2 patient was followed for 5 years with no evidence of recurrent disease until she developed a recurrence of the right pelvic sidewall in [place] 2010. This caused obstruction of the right ureter. She was treated with radiation therapy and concurrent cis-platinum chemotherapy.  #3 in late 2011 patient was found to have recurrence of the right inguinal lymph node and left pelvic nodes and received additional radiation therapy to those areas. His radiation was completed in January 2012. In April 2012 the patient had resolution of the right inguinal nodes. In August 2012 the patient was found to have a liver lesion which sure and 2.8 cm and received 6 cycles of Taxol. Patient had partial response to chemotherapy but the liver lesion began to increase to a size of 1.9. Reimaging showed that there were no other lesions except the liver and patient elected to a plate the liver lesion using RFA he performed a day 2013. The procedure went well but patient developed an extensive DVT in the left lower extremity and was placed on Lovenox and IVC filter  #4 patient subsequent treatments include of radiation therapy in June and August and September 2013. November 2013 she was found to have metastasis and small left inguinal lymph nodes. She resumed chemotherapy using low dose carboplatinum and Taxol between December and January 2014. This was complicated by neutropenia. Patient had CT scans in February 2014 that showed increase in size of the liver metastasis.   #5  Patient was started on Alimta in May 2014, she had a difficult time with this first cycle of chemotherapy and did not receive it again until she followed up with Dr. Stanford Breed in gyn/oncology had progressive disease and conveyed her desire to restart it.  She was then referred to Dr. Welton Flakes to restart Alimta therapy and resumed it on 10/26/12.   CURRENT THERAPY: Dose reduced cycle 5 of Alimta  INTERVAL HISTORY: [name] 62 y.o. female returns for followup today prior to getting cycle 5 of Alimta. She seems to have bounced back in comparison to last week. Today we discussed the possibility of dose reducing her since this is palliative chemotherapy. She and her husband concur. Today she is tired weak but has no nausea or vomiting she has not had any fevers chills or night sweats. She denies any easy bruising no bleeding problems. She will need to receive B12 injection with this dose. I did send a prescription for folic acid which she needs to continue taking as well. [name] is negative.  MEDICAL HISTORY: Past Medical History  Diagnosis Date  . Diastolic dysfunction   . MIGRAINE HEADACHE   . Atrial fibrillation     failed DCCV, CHADs2-prev pradaxa stopped due to hematuria with ureter issues/JJ   . Anemia   . HTN (hypertension)   . GLAUCOMA   . CARPAL TUNNEL SYNDROME, RIGHT   . Lymphedema     L>R leg from pelvic XRT/scarring  . Hepatic steatosis     CT scan 06/2009, 12/2009  . DVT (deep venous thrombosis) 10/2010  LLE, anticoag resumed  . CHF (congestive heart failure)     1 yr ago per pt  . Arthritis   . GERD (gastroesophageal reflux disease)   . Small kidney     left  . Radiation 10/11/11-11/19/11    5040 cGy in 28 fx's/periaortic  . Radiation 03/16/10-03/25/10    right inguinal node/left external iliac node  . Radiation 06/11/08-07/23/08    6000 cGy left pelvis  . Hyperlipidemia   . Leg swelling     left  . Cervical cancer dx 04/2003, recur  04/2008    s/p debulk (TAHBSO), chemo/XRT; recurrent dz L pelvis dx 2010 and liver met 09/2010 s/p RFA  . Osteoporosis with fracture 2014    compression fx, s/p KP ; started prolia 06/2012- narcotic pain mgmt    ALLERGIES:  is allergic to codeine; flecainide acetate; morphine; and propafenone hcl.  MEDICATIONS:  Current Outpatient Prescriptions  Medication Sig Dispense Refill  . bisacodyl (DULCOLAX) 5 MG EC tablet Take 5 mg by mouth daily as needed for constipation.      . cetirizine (ZYRTEC) 10 MG tablet Take 10 mg by mouth every evening.       . citalopram (CELEXA) 20 MG tablet Take 20 mg by mouth every evening.       . diltiazem (CARDIZEM CD) 180 MG 24 hr capsule Take 1 capsule (180 mg total) by mouth daily. TAke 2-3 hours before of after Metoprolol  30 capsule  0  . fentaNYL (DURAGESIC - DOSED MCG/HR) 100 MCG/HR Place 1 patch (100 mcg total) onto the skin every 3 (three) days. Total dose 125mg   10 patch  0  . fentaNYL (DURAGESIC - DOSED MCG/HR) 25 MCG/HR patch Place 1 patch (25 mcg total) onto the skin every 3 (three) days. Total dose of 125 mcg  10 patch  0  . ferrous fumarate (HEMOCYTE - 106 MG FE) 325 (106 FE) MG TABS Take 1 tablet (106 mg of iron total) by mouth daily.  30 each  0  . HYDROmorphone (DILAUDID) 4 MG tablet Take 1 tablet every 4 hours as needed for pain  120 tablet  0  . lidocaine-prilocaine (EMLA) cream Apply 1 application topically daily as needed (for port access).      . LORazepam (ATIVAN) 1 MG tablet Take 1 tablet (1 mg total) by mouth every 6 (six) hours as needed (for nausea (may put under tongue)).  30 tablet  2  . metoprolol tartrate (LOPRESSOR) 25 MG tablet Take 3 tablets (75 mg total) by mouth 2 (two) times daily.  180 tablet  2  . Nutritional Supplements (CARNATION INSTANT BREAKFAST PO) Take 1 Can by mouth 4 (four) times daily as needed (for meals).      . ondansetron (ZOFRAN) 8 MG tablet Take 1 tablet (8 mg total) by mouth every 8 (eight) hours as needed for  nausea.  30 tablet  2  . pantoprazole (PROTONIX) 40 MG tablet Take 1 tablet (40 mg total) by mouth daily.  30 tablet  1  . polyethylene glycol (MIRALAX / GLYCOLAX) packet Take 17 g by mouth 2 (two) times daily as needed (constipation).       . protein supplement (UNJURY CHICKEN SOUP) POWD Take 2 oz by mouth 4 (four) times daily as needed (for meals).      . Rivaroxaban (XARELTO) 20 MG TABS Take 1 tablet (20 mg total) by mouth every evening.  30 tablet  5  . sodium chloride (OCEAN) 0.65 % nasal spray Place  1 spray into the nose daily as needed for congestion. Allergies      . triamcinolone (NASACORT) 55 MCG/ACT nasal inhaler Place 2 sprays into the nose daily as needed (allergies or congestion).       [name] zolpidem (AMBIEN) 5 MG tablet Take 1 tablet (5 mg total) by mouth at bedtime as needed for sleep.  20 tablet  0  . folic acid (FOLVITE) 1 MG tablet Take 1 tablet (1 mg total) by mouth every morning.  30 tablet  6  . ibuprofen (ADVIL,MOTRIN) 200 MG tablet Take 600 mg by mouth 3 (three) times daily as needed for pain. Pain      . PRESCRIPTION MEDICATION Inject into the vein every 21 ( twenty-one) days. PEMEtrexed (ALIMTA) 950 mg in sodium chloride 0.9 % 100 mL chemo infusion 500 mg/m2  1.91 m2 (Treatment Plan Actual) md Welton Flakes       No current facility-administered medications for this visit.    SURGICAL HISTORY:  Past Surgical History  Procedure Laterality Date  . Ureteral stent placement  2005, 01/2010, 07/2010    L hydro related to cervical ca and XRT  . Oophorectomy  2005  . Cardioversion    . Tonsillectomy    . Cholecystectomy  1984  . Total abdominal hysterectomy  2005  . Carpal tunnel  2009    right  . Ivc filter  04/2011    REVIEW OF SYSTEMS:  A 10 point review of systems was conducted and is otherwise negative except for what is noted above.    PHYSICAL EXAMINATION: Blood pressure 91/60, pulse 80, temperature 98.2 F (36.8 C), temperature source Oral, resp. rate 17, height 5'  1.5" (1.562 m), weight 176 lb 14.4 oz (80.241 kg), SpO2 97.00%. Body mass index is 32.89 kg/(m^2). General: Patient is a well appearing female in no acute distress HEENT: PERRLA, sclerae anicteric no conjunctival pallor, MMM Neck: supple, no palpable adenopathy Lungs: clear to auscultation bilaterally, no wheezes, rhonchi, or rales Cardiovascular: regular rate rhythm, S1, S2, no murmurs, rubs or gallops Abdomen: Soft, non-tender, non-distended, normoactive bowel sounds, no HSM Extremities: warm and well perfused, no clubbing, cyanosis, increased edema in left lower extremity, with known h/o DVT in that leg.   Skin: No rashes or lesions Neuro: Non-focal ECOG PERFORMANCE STATUS: 0 - Asymptomatic   LABORATORY DATA: Lab Results  Component Value Date   WBC 8.4 12/28/2012   HGB 8.8* 12/28/2012   HCT 27.6* 12/28/2012   MCV 92.3 12/28/2012   PLT 309 12/28/2012      Chemistry      Component Value Date/Time   NA 137 12/28/2012 0844   NA 135 12/18/2012 0620   K 5.1 12/28/2012 0844   K 4.0 12/18/2012 0620   CL 101 12/18/2012 0620   CL 108* 08/15/2012 1252   CO2 25 12/28/2012 0844   CO2 24 12/18/2012 0620   BUN 15.8 12/28/2012 0844   BUN 5* 12/18/2012 0620   CREATININE 0.9 12/28/2012 0844   CREATININE 0.76 12/18/2012 0620   CREATININE 1.45* 09/11/2010 1603      Component Value Date/Time   CALCIUM 9.6 12/28/2012 0844   CALCIUM 9.6 12/18/2012 0620   ALKPHOS 349* 12/28/2012 0844   ALKPHOS 338* 12/18/2012 0620   AST 129* 12/28/2012 0844   AST 20 12/18/2012 0620   ALT 131* 12/28/2012 0844   ALT 22 12/18/2012 0620   BILITOT 0.62 12/28/2012 0844   BILITOT 0.5 12/18/2012 0620       RADIOGRAPHIC STUDIES:  Ct Chest W Contrast  10/13/2012   *RADIOLOGY REPORT*  Clinical Data:  Cervical cancer with metastasis.  Evaluate progression.  Restaging  CT CHEST, ABDOMEN AND PELVIS WITH CONTRAST  Technique:  Multidetector CT imaging of the chest, abdomen and pelvis was performed following the  standard protocol during bolus administration of intravenous contrast.  Contrast: OMNIPAQUE IOHEXOL 300 MG/ML  SOLN  Comparison:  CT 07/29/2012  CT CHEST  Findings:  No axillary or supraclavicular lymphadenopathy.  There is a port in the right anterior chest wall.  No mediastinal hilar lymphadenopathy.  No pericardial fluid.  Review of the lung parenchyma demonstrates no new or suspicious pulmonary nodules.  IMPRESSION: No evidence of thoracic metastasis.  CT ABDOMEN AND PELVIS  Findings:  The right hepatic lobe with metastasis are stable to slightly increased in size compared to prior.  One lesion which is increased is as along the falciform ligament in the right hepatic lobe measuring 3.8 x 6.1 cm compared to 3.6 x 4.7 cm on prior. Lesion in the posterior right hepatic lobe measures 3.6 x 3.9 cm unchanged from the 3.8 x 3.9 cm.  Lesions superior right hepatic lobe measures 3.9 x 4.3 cm compared to 4.2 x 4.4 cm on prior. There is some mild peripheral ductal dilatation  within the right hepatic lobe which appears increased compared to prior.  Mild duct dilatation of the left is similar.  Patient status post partial hepatectomy.  No significant periportal adenopathy.  7 mm periportal lymph node is unchanged (image 57).  The pancreas, spleen, adrenal glands, and right kidney normal.  The left kidney is atrophic.  There is an IVC filter inferior to the right renal vein.  No retroperitoneal or retrocrural adenopathy.  The stomach in a review of small bowel and colon unremarkable.  Post hysterectomy anatomy.  The bladder is collapsed.  No pelvic lymphadenopathy.  There is a low density collection along the left external iliac vessels measuring 31 x 21 mm which is unchanged in volume compared to prior.  This has simple fluid attenuation centrally likely represents a postsurgical fluid collection.  No evidence of pelvic lymphadenopathy. Review of  bone windows demonstrates no aggressive osseous lesions.  IMPRESSION:   1.  Essentially stable hepatic metastasis primarily in the right hepatic lobe.  One lesion measures larger. 2.  Essentially stable intrahepatic biliary duct dilatation. 3.  Atrophic left kidney. 4.  Hysterectomy with no evidence of local recurrence.   Original Report Authenticated By: Genevive Bi, M.D.   Ct Abdomen Pelvis W Contrast  10/13/2012   *RADIOLOGY REPORT*  Clinical Data:  Cervical cancer with metastasis.  Evaluate progression.  Restaging  CT CHEST, ABDOMEN AND PELVIS WITH CONTRAST  Technique:  Multidetector CT imaging of the chest, abdomen and pelvis was performed following the standard protocol during bolus administration of intravenous contrast.  Contrast: OMNIPAQUE IOHEXOL 300 MG/ML  SOLN  Comparison:  CT 07/29/2012  CT CHEST  Findings:  No axillary or supraclavicular lymphadenopathy.  There is a port in the right anterior chest wall.  No mediastinal hilar lymphadenopathy.  No pericardial fluid.  Review of the lung parenchyma demonstrates no new or suspicious pulmonary nodules.  IMPRESSION: No evidence of thoracic metastasis.  CT ABDOMEN AND PELVIS  Findings:  The right hepatic lobe with metastasis are stable to slightly increased in size compared to prior.  One lesion which is increased is as along the falciform ligament in the right hepatic lobe measuring 3.8 x 6.1 cm compared to 3.6  x 4.7 cm on prior. Lesion in the posterior right hepatic lobe measures 3.6 x 3.9 cm unchanged from the 3.8 x 3.9 cm.  Lesions superior right hepatic lobe measures 3.9 x 4.3 cm compared to 4.2 x 4.4 cm on prior. There is some mild peripheral ductal dilatation  within the right hepatic lobe which appears increased compared to prior.  Mild duct dilatation of the left is similar.  Patient status post partial hepatectomy.  No significant periportal adenopathy.  7 mm periportal lymph node is unchanged (image 57).  The pancreas, spleen, adrenal glands, and right kidney normal.  The left kidney is atrophic.  There is  an IVC filter inferior to the right renal vein.  No retroperitoneal or retrocrural adenopathy.  The stomach in a review of small bowel and colon unremarkable.  Post hysterectomy anatomy.  The bladder is collapsed.  No pelvic lymphadenopathy.  There is a low density collection along the left external iliac vessels measuring 31 x 21 mm which is unchanged in volume compared to prior.  This has simple fluid attenuation centrally likely represents a postsurgical fluid collection.  No evidence of pelvic lymphadenopathy. Review of  bone windows demonstrates no aggressive osseous lesions.  IMPRESSION:  1.  Essentially stable hepatic metastasis primarily in the right hepatic lobe.  One lesion measures larger. 2.  Essentially stable intrahepatic biliary duct dilatation. 3.  Atrophic left kidney. 4.  Hysterectomy with no evidence of local recurrence.   Original Report Authenticated By: Genevive Bi, M.D.    ASSESSMENT: 62 year old female with  #1 metastatic cervical carcinoma with liver metastasis. Patient has been treated with multiple therapeutic agents.  #2 patient's last treatment was with Alimta to be given every 21 days. She received cycle 1 of 07/21/2012. Thereafter due to complication was discontinued. She now with progressive disease there for she will resume cycle 2 on 10/26/2012. We have discussed using Neulasta on day 2 of her treatment to make sure that she does not develop febrile neutropenia as she did in the past. She is quite scared about this.  #3 patient to proceed with cycle 5 of Alimta today. However I have recommended dose reduction by 25%. This is reflected in her chemotherapy order set. We explained the rationale to her. They both concur including the patient and her husband. Patient will still need to continue getting. Supportive therapy including IV fluids and anti-emetics.  #4 depression and anxiety she is on Celexa.  #6 poor appetite: on megace  #7 DVT: Patient is on  xeralto   PLAN:  #1 proceed with dose reduced cycle 5 of Alimta today. She will also receive B12 injections.  #2 she will pick up her folic acid prescription and continue taking this as part of her treatment.  #3 she'll be seen back in one week's time for followup and IV fluids as well as antiemetics as needed.  All questions were answered. The patient knows to call the clinic with any problems, questions or concerns. We can certainly see the patient much sooner if necessary.  I spent 15 minutes counseling the patient face to face. The total time spent in the appointment was 25 minutes.  Drue Second, MD Medical/Oncology [facility] [phone] (beeper) [phone] (Office)

## 2013-01-03 NOTE — Progress Notes (Signed)
Quick Note:  Please call patient: no blood clot in leg ______

## 2013-01-04 ENCOUNTER — Ambulatory Visit: Payer: 59

## 2013-01-04 ENCOUNTER — Other Ambulatory Visit (HOSPITAL_BASED_OUTPATIENT_CLINIC_OR_DEPARTMENT_OTHER): Payer: 59 | Admitting: Lab

## 2013-01-04 ENCOUNTER — Ambulatory Visit (HOSPITAL_BASED_OUTPATIENT_CLINIC_OR_DEPARTMENT_OTHER): Payer: 59

## 2013-01-04 ENCOUNTER — Telehealth: Payer: Self-pay | Admitting: *Deleted

## 2013-01-04 ENCOUNTER — Other Ambulatory Visit: Payer: Self-pay | Admitting: Medical Oncology

## 2013-01-04 ENCOUNTER — Other Ambulatory Visit: Payer: Self-pay

## 2013-01-04 ENCOUNTER — Ambulatory Visit (HOSPITAL_BASED_OUTPATIENT_CLINIC_OR_DEPARTMENT_OTHER): Payer: 59 | Admitting: Family

## 2013-01-04 ENCOUNTER — Encounter: Payer: Self-pay | Admitting: Family

## 2013-01-04 VITALS — BP 121/70 | HR 85 | Temp 97.8°F | Resp 20

## 2013-01-04 VITALS — BP 103/69 | HR 89 | Temp 98.2°F | Resp 18 | Ht 61.5 in | Wt 172.2 lb

## 2013-01-04 DIAGNOSIS — C539 Malignant neoplasm of cervix uteri, unspecified: Secondary | ICD-10-CM

## 2013-01-04 DIAGNOSIS — C787 Secondary malignant neoplasm of liver and intrahepatic bile duct: Secondary | ICD-10-CM

## 2013-01-04 DIAGNOSIS — C770 Secondary and unspecified malignant neoplasm of lymph nodes of head, face and neck: Secondary | ICD-10-CM

## 2013-01-04 DIAGNOSIS — F411 Generalized anxiety disorder: Secondary | ICD-10-CM

## 2013-01-04 DIAGNOSIS — Z7901 Long term (current) use of anticoagulants: Secondary | ICD-10-CM

## 2013-01-04 DIAGNOSIS — R630 Anorexia: Secondary | ICD-10-CM

## 2013-01-04 DIAGNOSIS — M899 Disorder of bone, unspecified: Secondary | ICD-10-CM

## 2013-01-04 DIAGNOSIS — I82409 Acute embolism and thrombosis of unspecified deep veins of unspecified lower extremity: Secondary | ICD-10-CM

## 2013-01-04 LAB — CBC WITH DIFFERENTIAL/PLATELET
Basophils Absolute: 0.1 10*3/uL (ref 0.0–0.1)
EOS%: 0.5 % (ref 0.0–7.0)
Eosinophils Absolute: 0.1 10*3/uL (ref 0.0–0.5)
HCT: 26.9 % — ABNORMAL LOW (ref 34.8–46.6)
HGB: 8.9 g/dL — ABNORMAL LOW (ref 11.6–15.9)
MCH: 30.5 pg (ref 25.1–34.0)
NEUT#: 11.4 10*3/uL — ABNORMAL HIGH (ref 1.5–6.5)
RBC: 2.93 10*6/uL — ABNORMAL LOW (ref 3.70–5.45)
RDW: 22 % — ABNORMAL HIGH (ref 11.2–14.5)
WBC: 13.4 10*3/uL — ABNORMAL HIGH (ref 3.9–10.3)
lymph#: 0.9 10*3/uL (ref 0.9–3.3)

## 2013-01-04 LAB — COMPREHENSIVE METABOLIC PANEL (CC13)
ALT: 45 U/L (ref 0–55)
AST: 34 U/L (ref 5–34)
Albumin: 2.9 g/dL — ABNORMAL LOW (ref 3.5–5.0)
BUN: 13.4 mg/dL (ref 7.0–26.0)
Calcium: 10.2 mg/dL (ref 8.4–10.4)
Chloride: 99 mEq/L (ref 98–109)
Glucose: 112 mg/dl (ref 70–140)
Potassium: 4.3 mEq/L (ref 3.5–5.1)

## 2013-01-04 MED ORDER — DENOSUMAB 60 MG/ML ~~LOC~~ SOLN
60.0000 mg | Freq: Once | SUBCUTANEOUS | Status: AC
  Administered 2013-01-04: 60 mg via SUBCUTANEOUS
  Filled 2013-01-04: qty 1

## 2013-01-04 NOTE — Telephone Encounter (Signed)
appts made and printed. Pt is aware that cs will call w/ appts for CT's. Pt is also aware that i emailed Mardella Layman for a time slot on 01/26/13, and that tx will be added for 01/18/13. i emailed MW to add the tx...td

## 2013-01-04 NOTE — Patient Instructions (Signed)
Denosumab injection  What is this medicine?  DENOSUMAB (den oh sue mab) slows bone breakdown. Prolia is used to treat osteoporosis in women after menopause and in men. Xgeva is used to prevent bone fractures and other bone problems caused by cancer bone metastases. Xgeva is also used to treat giant cell tumor of the bone.  This medicine may be used for other purposes; ask your health care provider or pharmacist if you have questions.  COMMON BRAND NAME(S): Prolia, XGEVA  What should I tell my health care provider before I take this medicine?  They need to know if you have any of these conditions:  -dental disease  -eczema  -infection or history of infections  -kidney disease or on dialysis  -low blood calcium or vitamin D  -malabsorption syndrome  -scheduled to have surgery or tooth extraction  -taking medicine that contains denosumab  -thyroid or parathyroid disease  -an unusual reaction to denosumab, other medicines, foods, dyes, or preservatives  -pregnant or trying to get pregnant  -breast-feeding  How should I use this medicine?  This medicine is for injection under the skin. It is given by a health care professional in a hospital or clinic setting.  If you are getting Prolia, a special MedGuide will be given to you by the pharmacist with each prescription and refill. Be sure to read this information carefully each time.  For Prolia, talk to your pediatrician regarding the use of this medicine in children. Special care may be needed. For Xgeva, talk to your pediatrician regarding the use of this medicine in children. While this drug may be prescribed for children as young as 13 years for selected conditions, precautions do apply.  Overdosage: If you think you've taken too much of this medicine contact a poison control center or emergency room at once.  Overdosage: If you think you have taken too much of this medicine contact a poison control center or emergency room at once.  NOTE: This medicine is only for  you. Do not share this medicine with others.  What if I miss a dose?  It is important not to miss your dose. Call your doctor or health care professional if you are unable to keep an appointment.  What may interact with this medicine?  Do not take this medicine with any of the following medications:  -other medicines containing denosumab  This medicine may also interact with the following medications:  -medicines that suppress the immune system  -medicines that treat cancer  -steroid medicines like prednisone or cortisone  This list may not describe all possible interactions. Give your health care provider a list of all the medicines, herbs, non-prescription drugs, or dietary supplements you use. Also tell them if you smoke, drink alcohol, or use illegal drugs. Some items may interact with your medicine.  What should I watch for while using this medicine?  Visit your doctor or health care professional for regular checks on your progress. Your doctor or health care professional may order blood tests and other tests to see how you are doing.  Call your doctor or health care professional if you get a cold or other infection while receiving this medicine. Do not treat yourself. This medicine may decrease your body's ability to fight infection.  You should make sure you get enough calcium and vitamin D while you are taking this medicine, unless your doctor tells you not to. Discuss the foods you eat and the vitamins you take with your health care professional.    See your dentist regularly. Brush and floss your teeth as directed. Before you have any dental work done, tell your dentist you are receiving this medicine.  Do not become pregnant while taking this medicine or for 5 months after stopping it. Women should inform their doctor if they wish to become pregnant or think they might be pregnant. There is a potential for serious side effects to an unborn child. Talk to your health care professional or pharmacist for more  information.  What side effects may I notice from receiving this medicine?  Side effects that you should report to your doctor or health care professional as soon as possible:  -allergic reactions like skin rash, itching or hives, swelling of the face, lips, or tongue  -breathing problems  -chest pain  -fast, irregular heartbeat  -feeling faint or lightheaded, falls  -fever, chills, or any other sign of infection  -muscle spasms, tightening, or twitches  -numbness or tingling  -skin blisters or bumps, or is dry, peels, or red  -slow healing or unexplained pain in the mouth or jaw  -unusual bleeding or bruising  Side effects that usually do not require medical attention (Report these to your doctor or health care professional if they continue or are bothersome.):  -muscle pain  -stomach upset, gas  This list may not describe all possible side effects. Call your doctor for medical advice about side effects. You may report side effects to FDA at 1-800-FDA-1088.  Where should I keep my medicine?  This medicine is only given in a clinic, doctor's office, or other health care setting and will not be stored at home.  NOTE: This sheet is a summary. It may not cover all possible information. If you have questions about this medicine, talk to your doctor, pharmacist, or health care provider.  © 2014, [employer]. (2011-08-16 12:37:47)

## 2013-01-04 NOTE — Progress Notes (Addendum)
[facility]  [phone] [phone]  OFFICE PROGRESS NOTE  ID: [name]   DOB: 07-15-1950  MR#: [id number]  JYN#:[id number]   PCP: [name], MD GYN ONC:  Emmaline Kluver, MD   DIAGNOSIS: [name] is a 62 y.o. female with metastatic cervical carcinoma.   PRIOR THERAPY: #1 [name] in March 2005 for a stage IB cervical cancer. Her final pathology showed no residual disease and all lymph nodes were negative.  #2 She was followed for 5 years with no evidence of recurrent disease.  She had a recurrence in the right pelvic sidewall in February 2010 which caused an obstruction of the right ureter. She was treated with radiation therapy and concurrent Cisplatinum chemotherapy.  #3 In late 2011, she was found to have recurrence of the right inguinal lymph node and left pelvic nodes.  She received additional radiation therapy to those areas. Radiation was completed in January 2012.   In April 2012 the patient had resolution of the right inguinal nodes.   #4 In August 2012 the patient was found to have a liver lesion which measured 2.8 cm.  She received 6 cycles of Taxol with a partial response to chemotherapy with a liver lesion decreasing in size to 1.6 cm in 12/2010 PET scan.  PET scan also revealed that there were no other lesions except the liver.  Patient elected to ablate the liver lesion using RFA he performed on 05/07/2011.  The procedure went well but patient developed an extensive DVT in the left lower extremity.  She started receiving anticoagulant therapy with Lovenox and an IVC filter was placed.  #4 Received radiation therapy in June, August and September 2013.   #5 In November 2013, she was found to have metastasis in left inguinal lymph nodes. She received low-dose chemotherapy consisting of Carboplatinum and Taxol in December and  January 2014.  Chemotherapy course was complicated by neutropenia.  CT scans in February 2014  Showed an increase in size of the liver metastasis.   #5  She was started on Alimta in May 2014.  She had a difficult time with the first cycle of chemotherapy and did not receive it again until 10/26/2012.   CURRENT THERAPY: Dose reduced Alimta (21 day cycles), with day 2 Neulasta.   Prolia injection on 01/04/2013.    INTERVAL HISTORY: [name] 62 y.o. female returns for followup today after receiving cycle 5 of dose reduced Alimta on 12/28/2012.  She is accompanied for today's office visit by her husband Buddy.  She has complaints of continuing fatigue, decreased appetite, chronic left leg swelling (left lower extremity lymphedema), alternation between constipation and diarrhea, hip and back aches after receiving Neulasta injection, and a heaviness in her chest after receiving IV fluids.  It will be recalled that [name] also has several comorbidities including a history of atrophic left kidney, congestive heart failure, atrial fibrillation, and lower extremity DVT.  She was scheduled to receive IV fluids today which will be canceled.  She will proceed to receive Prolia injection today.  Her interval history is otherwise stable.   MEDICAL HISTORY: Past Medical History  Diagnosis Date  . Diastolic dysfunction   . MIGRAINE HEADACHE   . Atrial fibrillation     failed DCCV, CHADs2-prev pradaxa stopped due to hematuria with ureter issues/JJ   . Anemia   . HTN (hypertension)   . GLAUCOMA   . CARPAL TUNNEL  SYNDROME, RIGHT   . Lymphedema     L>R leg from pelvic XRT/scarring  . Hepatic steatosis     CT scan 06/2009, 12/2009  . DVT (deep venous thrombosis) 10/2010    LLE, anticoag resumed  . CHF (congestive heart failure)     1 yr ago per pt  . Arthritis   . GERD (gastroesophageal reflux disease)   . Small kidney     left  . Radiation 10/11/11-11/19/11    5040 cGy in 28 fx's/periaortic  .  Radiation 03/16/10-03/25/10    right inguinal node/left external iliac node  . Radiation 06/11/08-07/23/08    6000 cGy left pelvis  . Hyperlipidemia   . Leg swelling     left  . Cervical cancer dx 04/2003, recur 04/2008    s/p debulk (TAHBSO), chemo/XRT; recurrent dz L pelvis dx 2010 and liver met 09/2010 s/p RFA  . Osteoporosis with fracture 2014    compression fx, s/p KP ; started prolia 06/2012- narcotic pain mgmt    ALLERGIES:   Allergies  Allergen Reactions  . Codeine Rash and Other (See Comments)    Breathing issues  . Flecainide Acetate Other (See Comments)    Doesn't remember reaction  . Morphine Other (See Comments)     Hallucinations  . Propafenone Hcl Other (See Comments)    Leg cramps    MEDICATIONS:  Current Outpatient Prescriptions  Medication Sig Dispense Refill  . bisacodyl (DULCOLAX) 5 MG EC tablet Take 5 mg by mouth daily as needed for constipation.      . cetirizine (ZYRTEC) 10 MG tablet Take 10 mg by mouth every evening.       . citalopram (CELEXA) 20 MG tablet Take 20 mg by mouth every evening.       . diltiazem (CARDIZEM CD) 180 MG 24 hr capsule Take 1 capsule (180 mg total) by mouth daily. TAke 2-3 hours before of after Metoprolol  30 capsule  0  . fentaNYL (DURAGESIC - DOSED MCG/HR) 100 MCG/HR Place 1 patch (100 mcg total) onto the skin every 3 (three) days. Total dose 125mg   10 patch  0  . fentaNYL (DURAGESIC - DOSED MCG/HR) 25 MCG/HR patch Place 1 patch (25 mcg total) onto the skin every 3 (three) days. Total dose of 125 mcg  10 patch  0  . ferrous fumarate (HEMOCYTE - 106 MG FE) 325 (106 FE) MG TABS Take 1 tablet (106 mg of iron total) by mouth daily.  30 each  0  . folic acid (FOLVITE) 1 MG tablet Take 1 tablet (1 mg total) by mouth every morning.  30 tablet  6  . HYDROmorphone (DILAUDID) 4 MG tablet Take 1 tablet every 4 hours as needed for pain  120 tablet  0  . lidocaine-prilocaine (EMLA) cream Apply 1 application topically daily as needed (for port  access).      . LORazepam (ATIVAN) 1 MG tablet Take 1 tablet (1 mg total) by mouth every 6 (six) hours as needed (for nausea (may put under tongue)).  30 tablet  2  . metoprolol tartrate (LOPRESSOR) 25 MG tablet Take 3 tablets (75 mg total) by mouth 2 (two) times daily.  180 tablet  2  . Nutritional Supplements (CARNATION INSTANT BREAKFAST PO) Take 1 Can by mouth 4 (four) times daily as needed (for meals).      . ondansetron (ZOFRAN) 8 MG tablet Take 1 tablet (8 mg total) by mouth every 8 (eight) hours as needed for nausea.  30  tablet  2  . pantoprazole (PROTONIX) 40 MG tablet Take 1 tablet (40 mg total) by mouth daily.  30 tablet  1  . polyethylene glycol (MIRALAX / GLYCOLAX) packet Take 17 g by mouth 2 (two) times daily as needed (constipation).       [name] PRESCRIPTION MEDICATION Inject into the vein every 21 ( twenty-one) days. PEMEtrexed (ALIMTA) 950 mg in sodium chloride 0.9 % 100 mL chemo infusion 500 mg/m2  1.91 m2 (Treatment Plan Actual) md Welton Flakes      . protein supplement (UNJURY CHICKEN SOUP) POWD Take 2 oz by mouth 4 (four) times daily as needed (for meals).      . Rivaroxaban (XARELTO) 20 MG TABS Take 1 tablet (20 mg total) by mouth every evening.  30 tablet  5  . sodium chloride (OCEAN) 0.65 % nasal spray Place 1 spray into the nose daily as needed for congestion. Allergies      . triamcinolone (NASACORT) 55 MCG/ACT nasal inhaler Place 2 sprays into the nose daily as needed (allergies or congestion).       [name] zolpidem (AMBIEN) 5 MG tablet Take 1 tablet (5 mg total) by mouth at bedtime as needed for sleep.  20 tablet  0  . ibuprofen (ADVIL,MOTRIN) 200 MG tablet Take 600 mg by mouth 3 (three) times daily as needed for pain. Pain       No current facility-administered medications for this visit.    SURGICAL HISTORY:  Past Surgical History  Procedure Laterality Date  . Ureteral stent placement  2005, 01/2010, 07/2010    L hydro related to cervical ca and XRT  . Oophorectomy  2005  .  Cardioversion    . Tonsillectomy    . Cholecystectomy  1984  . Total abdominal hysterectomy  2005  . Carpal tunnel  2009    right  . Ivc filter  04/2011    REVIEW OF SYSTEMS:  A 10 point review of systems was conducted and is negative except as stated above.  [name] denies any other symptomatology including  fatigue, fever or chills, headache, vision changes, swollen glands, cough or shortness of breath, chest pain or discomfort, nausea, vomiting, any other changes in urinary or bowel habits, any other arthralgias/myalgias, unusual bleeding/bruising or any other symptomatology.  PHYSICAL EXAMINATION: Blood pressure 103/69, pulse 89, temperature 98.2 F (36.8 C), temperature source Oral, resp. rate 18, height 5' 1.5" (1.562 m), weight 172 lb 3.2 oz (78.109 kg). Body mass index is 32.01 kg/(m^2).  ECOG PERFORMANCE STATUS: 1 - Symptomatic but completely ambulatory  General appearance: Alert, cooperative, well nourished, no apparent distress Head: Normocephalic, without obvious abnormality, atraumatic Eyes: Conjunctivae/corneas clear, PERRLA, EOMI Nose: Nares, septum and mucosa are normal, no drainage or sinus tenderness Neck: No adenopathy, supple, symmetrical, trachea midline, no tenderness Resp: Clear to auscultation bilaterally, no wheezes/rales/rhonchi Cardio: Irregularly irregular, no murmur, click, rub or gallop, +3 left lower extremity edema GI: Soft, distended, right lower quadrant tenderness, hypoactive bowel sounds, excessive habitus Skin: No rashes/lesions, skin warm and dry, no erythematous areas, no cyanosis, bilateral lower extremity varicose veins  M/S:  Atraumatic, limited strength and range of motion in left lower extremity, no clubbing  Lymph nodes: Cervical, supraclavicular, and axillary nodes normal Neurologic: Grossly normal, cranial nerves II through XII intact, alert and oriented x 3 Psych: Appropriate affect    LABORATORY DATA: Lab Results  Component Value  Date   WBC 13.4* 01/04/2013   HGB 8.9* 01/04/2013   HCT 26.9* 01/04/2013  MCV 91.7 01/04/2013   PLT 190 01/04/2013      Chemistry      Component Value Date/Time   NA 135* 01/04/2013 1234   NA 135 12/18/2012 0620   K 4.3 01/04/2013 1234   K 4.0 12/18/2012 0620   CL 101 12/18/2012 0620   CL 108* 08/15/2012 1252   CO2 25 01/04/2013 1234   CO2 24 12/18/2012 0620   BUN 13.4 01/04/2013 1234   BUN 5* 12/18/2012 0620   CREATININE 0.9 01/04/2013 1234   CREATININE 0.76 12/18/2012 0620   CREATININE 1.45* 09/11/2010 1603      Component Value Date/Time   CALCIUM 10.2 01/04/2013 1234   CALCIUM 9.6 12/18/2012 0620   ALKPHOS 372* 01/04/2013 1234   ALKPHOS 338* 12/18/2012 0620   AST 34 01/04/2013 1234   AST 20 12/18/2012 0620   ALT 45 01/04/2013 1234   ALT 22 12/18/2012 0620   BILITOT 0.77 01/04/2013 1234   BILITOT 0.5 12/18/2012 0620       RADIOGRAPHIC STUDIES: No results found.   ASSESSMENT: JULIUS BONIFACE is a  62 y.o. woman with:  #1 Metastatic cervical carcinoma with liver metastasis. Patient has been treated with multiple therapeutic agents.  #2 Current therapy is with Alimta to be given every 21 days. She received cycle 1 on 07/21/2012.  Therapy was then discontinued due to complication after first cycle.  Now with progressive disease and resumed therapy with cycle 2 of 25%  dose reduced  Alimta on 10/26/2012.  Receiving Neulasta on day 2 of her treatment to avoid developing febrile neutropenia as she has previously.  #3 Depression and anxiety - On Celexa.  #4 Poor appetite - using Carnation Instant Breakfast and protein supplement (Unjury chicken soup)  #5 DVT - On Xarelto  #6 Chronic left lower extremity lymphedema - Will inquire about physical therapy closer to her home in [place], [place].   PLAN:  #1 Patient to proceed with Prolia injection today.  IV fluids will be held today secondary to history of congestion heart failure and heaviness in her chest after receiving IV  fluids previously.  #2 Therapy with dose reduced Alimta cycle #5 scheduled to proceed on 01/18/2013 with Neulasta injection the following day.  We will check laboratories of CBC and CMP on that day.    #3 Scans of CT of the chest and CT of the abdomen/pelvis will be scheduled after cycle 5 with Alimta.   All questions answered.  Mr. and [name] were encouraged to contact us in the interim with any questions, concerns, or problems.  Larina Bras, NP-C 01/07/2013   3:47 PM  ATTENDING'S ATTESTATION:  I personally reviewed patient's chart, examined patient myself, formulated the treatment plan as followed.    Patient tolerated dose reduced Alimta very well. She is still fatigued and tired but her counts are doing well. She'll proceed with her prolia injection today. He back again on 01/19/1999 413 for cycle 5 of Alimta. This will remain dose reduced. We will also plan on doing restaging scans after that cycle.  Drue Second, MD Medical/Oncology [facility] [phone] (beeper) [phone] (Office)  01/08/2013, 12:15 PM

## 2013-01-04 NOTE — Patient Instructions (Signed)
Results for orders placed in visit on 01/04/13 (from the past 24 hour(s))  CBC WITH DIFFERENTIAL     Status: Abnormal   Collection Time    01/04/13 12:34 PM      Result Value Range   WBC 13.4 (*) 3.9 - 10.3 10e3/uL   NEUT# 11.4 (*) 1.5 - 6.5 10e3/uL   HGB 8.9 (*) 11.6 - 15.9 g/dL   HCT 16.1 (*) 09.6 - 04.5 %   Platelets 190  145 - 400 10e3/uL   MCV 91.7  79.5 - 101.0 fL   MCH 30.5  25.1 - 34.0 pg   MCHC 33.3  31.5 - 36.0 g/dL   RBC 4.09 (*) 8.11 - 9.14 10e6/uL   RDW 22.0 (*) 11.2 - 14.5 %   lymph# 0.9  0.9 - 3.3 10e3/uL   MONO# 1.0 (*) 0.1 - 0.9 10e3/uL   Eosinophils Absolute 0.1  0.0 - 0.5 10e3/uL   Basophils Absolute 0.1  0.0 - 0.1 10e3/uL   NEUT% 85.3 (*) 38.4 - 76.8 %   LYMPH% 6.5 (*) 14.0 - 49.7 %   MONO% 7.2  0.0 - 14.0 %   EOS% 0.5  0.0 - 7.0 %   BASO% 0.5  0.0 - 2.0 %   Narrative:    Performed At:  [facility]               [address]               [place], [place] [address]  COMPREHENSIVE METABOLIC PANEL (CC13)     Status: Abnormal   Collection Time    01/04/13 12:34 PM      Result Value Range   Sodium 135 (*) 136 - 145 mEq/L   Potassium 4.3  3.5 - 5.1 mEq/L   Chloride 99  98 - 109 mEq/L   CO2 25  22 - 29 mEq/L   Glucose 112  70 - 140 mg/dl   BUN 62.1  7.0 - 30.8 mg/dL   Creatinine 0.9  0.6 - 1.1 mg/dL   Total Bilirubin 6.57  0.20 - 1.20 mg/dL   Alkaline Phosphatase 372 (*) 40 - 150 U/L   AST 34  5 - 34 U/L   ALT 45  0 - 55 U/L   Total Protein 6.8  6.4 - 8.3 g/dL   Albumin 2.9 (*) 3.5 - 5.0 g/dL   Calcium 84.6  8.4 - 96.2 mg/dL   Anion Gap 10  3 - 11 mEq/L   Narrative:    Performed At:  [facility]               [address]               [place], [place] [address]

## 2013-01-04 NOTE — Telephone Encounter (Signed)
Per staff message and POF I have scheduled appts.  JMW  

## 2013-01-04 NOTE — Progress Notes (Signed)
CMET results reviewed by Christell Faith, pharmacist.  Proceed with Prolia injection as per pharmacist.

## 2013-01-15 ENCOUNTER — Ambulatory Visit (HOSPITAL_COMMUNITY): Payer: 59

## 2013-01-16 ENCOUNTER — Encounter (HOSPITAL_COMMUNITY): Payer: Self-pay

## 2013-01-16 ENCOUNTER — Ambulatory Visit (HOSPITAL_COMMUNITY)
Admission: RE | Admit: 2013-01-16 | Discharge: 2013-01-16 | Disposition: A | Discharge status: Home / Self Care | Payer: 59 | Source: Ambulatory Visit | Attending: Family | Admitting: Family

## 2013-01-16 DIAGNOSIS — M8448XA Pathological fracture, other site, initial encounter for fracture: Secondary | ICD-10-CM | POA: Insufficient documentation

## 2013-01-16 DIAGNOSIS — C787 Secondary malignant neoplasm of liver and intrahepatic bile duct: Secondary | ICD-10-CM | POA: Insufficient documentation

## 2013-01-16 DIAGNOSIS — M47814 Spondylosis without myelopathy or radiculopathy, thoracic region: Secondary | ICD-10-CM | POA: Insufficient documentation

## 2013-01-16 DIAGNOSIS — R935 Abnormal findings on diagnostic imaging of other abdominal regions, including retroperitoneum: Secondary | ICD-10-CM | POA: Insufficient documentation

## 2013-01-16 DIAGNOSIS — C539 Malignant neoplasm of cervix uteri, unspecified: Secondary | ICD-10-CM | POA: Insufficient documentation

## 2013-01-16 DIAGNOSIS — Z90710 Acquired absence of both cervix and uterus: Secondary | ICD-10-CM | POA: Insufficient documentation

## 2013-01-16 DIAGNOSIS — I700 Atherosclerosis of aorta: Secondary | ICD-10-CM | POA: Insufficient documentation

## 2013-01-16 DIAGNOSIS — C770 Secondary and unspecified malignant neoplasm of lymph nodes of head, face and neck: Secondary | ICD-10-CM

## 2013-01-16 MED ORDER — IOHEXOL 300 MG/ML  SOLN
100.0000 mL | Freq: Once | INTRAMUSCULAR | Status: AC | PRN
  Administered 2013-01-16: 100 mL via INTRAVENOUS

## 2013-01-18 ENCOUNTER — Ambulatory Visit (HOSPITAL_BASED_OUTPATIENT_CLINIC_OR_DEPARTMENT_OTHER): Payer: 59

## 2013-01-18 ENCOUNTER — Other Ambulatory Visit (HOSPITAL_BASED_OUTPATIENT_CLINIC_OR_DEPARTMENT_OTHER): Payer: 59 | Admitting: Lab

## 2013-01-18 ENCOUNTER — Encounter: Payer: Self-pay | Admitting: Adult Health

## 2013-01-18 ENCOUNTER — Ambulatory Visit (HOSPITAL_BASED_OUTPATIENT_CLINIC_OR_DEPARTMENT_OTHER): Payer: 59 | Admitting: Adult Health

## 2013-01-18 VITALS — BP 94/59 | HR 83 | Temp 98.2°F | Resp 18 | Ht 61.0 in | Wt 168.7 lb

## 2013-01-18 DIAGNOSIS — C787 Secondary malignant neoplasm of liver and intrahepatic bile duct: Secondary | ICD-10-CM

## 2013-01-18 DIAGNOSIS — C539 Malignant neoplasm of cervix uteri, unspecified: Secondary | ICD-10-CM

## 2013-01-18 DIAGNOSIS — Z5111 Encounter for antineoplastic chemotherapy: Secondary | ICD-10-CM

## 2013-01-18 DIAGNOSIS — F341 Dysthymic disorder: Secondary | ICD-10-CM

## 2013-01-18 DIAGNOSIS — C770 Secondary and unspecified malignant neoplasm of lymph nodes of head, face and neck: Secondary | ICD-10-CM

## 2013-01-18 DIAGNOSIS — I82409 Acute embolism and thrombosis of unspecified deep veins of unspecified lower extremity: Secondary | ICD-10-CM

## 2013-01-18 DIAGNOSIS — C649 Malignant neoplasm of unspecified kidney, except renal pelvis: Secondary | ICD-10-CM

## 2013-01-18 DIAGNOSIS — I890 Lymphedema, not elsewhere classified: Secondary | ICD-10-CM

## 2013-01-18 LAB — CBC WITH DIFFERENTIAL/PLATELET
BASO%: 1.3 % (ref 0.0–2.0)
Basophils Absolute: 0.1 10*3/uL (ref 0.0–0.1)
HCT: 29.5 % — ABNORMAL LOW (ref 34.8–46.6)
HGB: 9.7 g/dL — ABNORMAL LOW (ref 11.6–15.9)
MCHC: 32.8 g/dL (ref 31.5–36.0)
MONO#: 0.8 10*3/uL (ref 0.1–0.9)
NEUT%: 75.1 % (ref 38.4–76.8)
RDW: 20.3 % — ABNORMAL HIGH (ref 11.2–14.5)
WBC: 6.2 10*3/uL (ref 3.9–10.3)
lymph#: 0.6 10*3/uL — ABNORMAL LOW (ref 0.9–3.3)

## 2013-01-18 LAB — COMPREHENSIVE METABOLIC PANEL (CC13)
ALT: 50 U/L (ref 0–55)
AST: 37 U/L — ABNORMAL HIGH (ref 5–34)
Albumin: 3.3 g/dL — ABNORMAL LOW (ref 3.5–5.0)
Anion Gap: 9 mEq/L (ref 3–11)
BUN: 12.8 mg/dL (ref 7.0–26.0)
CO2: 22 mEq/L (ref 22–29)
Calcium: 10 mg/dL (ref 8.4–10.4)
Chloride: 105 mEq/L (ref 98–109)
Creatinine: 0.8 mg/dL (ref 0.6–1.1)
Glucose: 120 mg/dl (ref 70–140)
Potassium: 4.8 mEq/L (ref 3.5–5.1)
Total Protein: 7.2 g/dL (ref 6.4–8.3)

## 2013-01-18 MED ORDER — SODIUM CHLORIDE 0.9 % IV SOLN: 500.0000 mg/m2 | Freq: Once | INTRAVENOUS | Status: DC

## 2013-01-18 MED ORDER — SODIUM CHLORIDE 0.9 % IJ SOLN
10.0000 mL | INTRAMUSCULAR | Status: DC | PRN
  Administered 2013-01-18: 10 mL
  Filled 2013-01-18: qty 10

## 2013-01-18 MED ORDER — ONDANSETRON 8 MG/50ML IVPB (CHCC)
8.0000 mg | Freq: Once | INTRAVENOUS | Status: AC
  Administered 2013-01-18: 8 mg via INTRAVENOUS

## 2013-01-18 MED ORDER — DEXAMETHASONE SODIUM PHOSPHATE 10 MG/ML IJ SOLN
10.0000 mg | Freq: Once | INTRAMUSCULAR | Status: AC
  Administered 2013-01-18: 10 mg via INTRAVENOUS

## 2013-01-18 MED ORDER — HEPARIN SOD (PORK) LOCK FLUSH 100 UNIT/ML IV SOLN
500.0000 [IU] | Freq: Once | INTRAVENOUS | Status: AC | PRN
  Administered 2013-01-18: 500 [IU]
  Filled 2013-01-18: qty 5

## 2013-01-18 MED ORDER — SODIUM CHLORIDE 0.9 % IV SOLN
400.0000 mg/m2 | Freq: Once | INTRAVENOUS | Status: AC
  Administered 2013-01-18: 725 mg via INTRAVENOUS
  Filled 2013-01-18: qty 29

## 2013-01-18 MED ORDER — SODIUM CHLORIDE 0.9 % IV SOLN
Freq: Once | INTRAVENOUS | Status: AC
  Administered 2013-01-18: 13:00:00 via INTRAVENOUS

## 2013-01-18 NOTE — Patient Instructions (Signed)
[facility] Discharge Instructions for Patients Receiving Chemotherapy  Today you received the following chemotherapy agent: Alimta   To help prevent nausea and vomiting after your treatment, we encourage you to take your nausea medication as prescribed.    If you develop nausea and vomiting that is not controlled by your nausea medication, call the clinic.   BELOW ARE SYMPTOMS THAT SHOULD BE REPORTED IMMEDIATELY:  *FEVER GREATER THAN 100.5 F  *CHILLS WITH OR WITHOUT FEVER  NAUSEA AND VOMITING THAT IS NOT CONTROLLED WITH YOUR NAUSEA MEDICATION  *UNUSUAL SHORTNESS OF BREATH  *UNUSUAL BRUISING OR BLEEDING  TENDERNESS IN MOUTH AND THROAT WITH OR WITHOUT PRESENCE OF ULCERS  *URINARY PROBLEMS  *BOWEL PROBLEMS  UNUSUAL RASH Items with * indicate a potential emergency and should be followed up as soon as possible.  Feel free to call the clinic you have any questions or concerns. The clinic phone number is [phone].    

## 2013-01-18 NOTE — Progress Notes (Addendum)
[facility]  [phone] Fax:([phone]) [phone]  OFFICE PROGRESS NOTE  ID: [name]   DOB: 07-06-1950  MR#: [id number]  JYN#:[id number]   PCP: [name], MD GYN ONC:  Emmaline Kluver, MD   DIAGNOSIS: [name] is a 62 y.o. female with metastatic cervical carcinoma.   PRIOR THERAPY: #1 [name] in March 2005 for a stage IB cervical cancer. Her final pathology showed no residual disease and all lymph nodes were negative.  #2 She was followed for 5 years with no evidence of recurrent disease.  She had a recurrence in the right pelvic sidewall in February 2010 which caused an obstruction of the right ureter. She was treated with radiation therapy and concurrent Cisplatinum chemotherapy.  #3 In late 2011, she was found to have recurrence of the right inguinal lymph node and left pelvic nodes.  She received additional radiation therapy to those areas. Radiation was completed in January 2012.   In April 2012 the patient had resolution of the right inguinal nodes.   #4 In August 2012 the patient was found to have a liver lesion which measured 2.8 cm.  She received 6 cycles of Taxol with a partial response to chemotherapy with a liver lesion decreasing in size to 1.6 cm in 12/2010 PET scan.  PET scan also revealed that there were no other lesions except the liver.  Patient elected to ablate the liver lesion using RFA he performed on 05/07/2011.  The procedure went well but patient developed an extensive DVT in the left lower extremity.  She started receiving anticoagulant therapy with Lovenox and an IVC filter was placed.  #4 Received radiation therapy in June, August and September 2013.   #5 In November 2013, she was found to have metastasis in left inguinal lymph nodes. She received low-dose chemotherapy consisting of Carboplatinum and Taxol in December and  January 2014.  Chemotherapy course was complicated by neutropenia.  CT scans in February 2014  Showed an increase in size of the liver metastasis.   #5  She was started on Alimta in May 2014.  She had a difficult time with the first cycle of chemotherapy and did not receive it again until 10/26/2012.   CURRENT THERAPY: Dose reduced Alimta (21 day cycles), with day 2 Neulasta.   Prolia injection on 01/04/2013.    INTERVAL HISTORY: [name] 62 y.o. female returns for evaluation prior to Alimta.  She is doing well today.  The dose reduction of her previous cycle helped.  She did not feel as fatigued.  She has had some constipation that she has managed with Miralax.  She denies fevers, chills, nausea, vomiting, diarrhea, numbness, dyspnea, cough, chest pain, palpitation, or further concerns.  She remains in controlled atrial fibrillation.  Otherwise, a 10 point ROS is neg.    MEDICAL HISTORY: Past Medical History  Diagnosis Date  . Diastolic dysfunction   . MIGRAINE HEADACHE   . Atrial fibrillation     failed DCCV, CHADs2-prev pradaxa stopped due to hematuria with ureter issues/JJ   . Anemia   . HTN (hypertension)   . GLAUCOMA   . CARPAL TUNNEL SYNDROME, RIGHT   . Lymphedema     L>R leg from pelvic XRT/scarring  . Hepatic steatosis     CT scan 06/2009, 12/2009  . DVT (deep venous thrombosis) 10/2010    LLE, anticoag resumed  . CHF (congestive heart failure)  1 yr ago per pt  . Arthritis   . GERD (gastroesophageal reflux disease)   . Small kidney     left  . Radiation 10/11/11-11/19/11    5040 cGy in 28 fx's/periaortic  . Radiation 03/16/10-03/25/10    right inguinal node/left external iliac node  . Radiation 06/11/08-07/23/08    6000 cGy left pelvis  . Hyperlipidemia   . Leg swelling     left  . Cervical cancer dx 04/2003, recur 04/2008    s/p debulk (TAHBSO), chemo/XRT; recurrent dz L pelvis dx 2010 and liver met 09/2010 s/p RFA  . Osteoporosis with fracture 2014     compression fx, s/p KP ; started prolia 06/2012- narcotic pain mgmt    ALLERGIES:   Allergies  Allergen Reactions  . Codeine Rash and Other (See Comments)    Breathing issues  . Flecainide Acetate Other (See Comments)    Doesn't remember reaction  . Morphine Other (See Comments)     Hallucinations  . Propafenone Hcl Other (See Comments)    Leg cramps    MEDICATIONS:  Current Outpatient Prescriptions  Medication Sig Dispense Refill  . bisacodyl (DULCOLAX) 5 MG EC tablet Take 5 mg by mouth daily as needed for constipation.      . cetirizine (ZYRTEC) 10 MG tablet Take 10 mg by mouth every evening.       . citalopram (CELEXA) 20 MG tablet Take 20 mg by mouth every evening.       . diltiazem (CARDIZEM CD) 180 MG 24 hr capsule Take 1 capsule (180 mg total) by mouth daily. TAke 2-3 hours before of after Metoprolol  30 capsule  0  . fentaNYL (DURAGESIC - DOSED MCG/HR) 100 MCG/HR Place 1 patch (100 mcg total) onto the skin every 3 (three) days. Total dose 125mg   10 patch  0  . fentaNYL (DURAGESIC - DOSED MCG/HR) 25 MCG/HR patch Place 1 patch (25 mcg total) onto the skin every 3 (three) days. Total dose of 125 mcg  10 patch  0  . ferrous fumarate (HEMOCYTE - 106 MG FE) 325 (106 FE) MG TABS Take 1 tablet (106 mg of iron total) by mouth daily.  30 each  0  . folic acid (FOLVITE) 1 MG tablet Take 1 tablet (1 mg total) by mouth every morning.  30 tablet  6  . HYDROmorphone (DILAUDID) 4 MG tablet Take 1 tablet every 4 hours as needed for pain  120 tablet  0  . ibuprofen (ADVIL,MOTRIN) 200 MG tablet Take 600 mg by mouth 3 (three) times daily as needed for pain. Pain      . lidocaine-prilocaine (EMLA) cream Apply 1 application topically daily as needed (for port access).      . LORazepam (ATIVAN) 1 MG tablet Take 1 tablet (1 mg total) by mouth every 6 (six) hours as needed (for nausea (may put under tongue)).  30 tablet  2  . metoprolol tartrate (LOPRESSOR) 25 MG tablet Take 3 tablets (75 mg total) by  mouth 2 (two) times daily.  180 tablet  2  . Nutritional Supplements (CARNATION INSTANT BREAKFAST PO) Take 1 Can by mouth 4 (four) times daily as needed (for meals).      . ondansetron (ZOFRAN) 8 MG tablet Take 1 tablet (8 mg total) by mouth every 8 (eight) hours as needed for nausea.  30 tablet  2  . pantoprazole (PROTONIX) 40 MG tablet Take 1 tablet (40 mg total) by mouth daily.  30 tablet  1  .  polyethylene glycol (MIRALAX / GLYCOLAX) packet Take 17 g by mouth 2 (two) times daily as needed (constipation).       [name] PRESCRIPTION MEDICATION Inject into the vein every 21 ( twenty-one) days. PEMEtrexed (ALIMTA) 950 mg in sodium chloride 0.9 % 100 mL chemo infusion 500 mg/m2  1.91 m2 (Treatment Plan Actual) md Welton Flakes      . protein supplement (UNJURY CHICKEN SOUP) POWD Take 2 oz by mouth 4 (four) times daily as needed (for meals).      . Rivaroxaban (XARELTO) 20 MG TABS Take 1 tablet (20 mg total) by mouth every evening.  30 tablet  5  . sodium chloride (OCEAN) 0.65 % nasal spray Place 1 spray into the nose daily as needed for congestion. Allergies      . triamcinolone (NASACORT) 55 MCG/ACT nasal inhaler Place 2 sprays into the nose daily as needed (allergies or congestion).       [name] zolpidem (AMBIEN) 5 MG tablet Take 1 tablet (5 mg total) by mouth at bedtime as needed for sleep.  20 tablet  0   No current facility-administered medications for this visit.    SURGICAL HISTORY:  Past Surgical History  Procedure Laterality Date  . Ureteral stent placement  2005, 01/2010, 07/2010    L hydro related to cervical ca and XRT  . Oophorectomy  2005  . Cardioversion    . Tonsillectomy    . Cholecystectomy  1984  . Total abdominal hysterectomy  2005  . Carpal tunnel  2009    right  . Ivc filter  04/2011    REVIEW OF SYSTEMS:  A 10 point review of systems was conducted and is negative except as stated above.    PHYSICAL EXAMINATION: Blood pressure 94/59, pulse 83, temperature 98.2 F (36.8 C),  temperature source Oral, resp. rate 18, height 5\' 1"  (1.549 m), weight 168 lb 11.2 oz (76.522 kg), SpO2 97.00%. Body mass index is 31.89 kg/(m^2). General: Patient is a well appearing female in no acute distress HEENT: PERRLA, sclerae anicteric no conjunctival pallor, MMM Neck: supple, no palpable adenopathy Lungs: clear to auscultation bilaterally, no wheezes, rhonchi, or rales Cardiovascular: regular rate rhythm, S1, S2, no murmurs, rubs or gallops Abdomen: Soft, non-tender, non-distended, normoactive bowel sounds, no HSM Extremities: warm and well perfused, no clubbing, cyanosis, or edema Skin: No rashes or lesions Neuro: Non-focal ECOG PERFORMANCE STATUS: 1 - Symptomatic but completely ambulatory   LABORATORY DATA: Lab Results  Component Value Date   WBC 6.2 01/18/2013   HGB 9.7* 01/18/2013   HCT 29.5* 01/18/2013   MCV 95.2 01/18/2013   PLT 360 01/18/2013      Chemistry      Component Value Date/Time   NA 136 01/18/2013 1003   NA 135 12/18/2012 0620   K 4.8 01/18/2013 1003   K 4.0 12/18/2012 0620   CL 101 12/18/2012 0620   CL 108* 08/15/2012 1252   CO2 22 01/18/2013 1003   CO2 24 12/18/2012 0620   BUN 12.8 01/18/2013 1003   BUN 5* 12/18/2012 0620   CREATININE 0.8 01/18/2013 1003   CREATININE 0.76 12/18/2012 0620   CREATININE 1.45* 09/11/2010 1603      Component Value Date/Time   CALCIUM 10.0 01/18/2013 1003   CALCIUM 9.6 12/18/2012 0620   ALKPHOS 455* 01/18/2013 1003   ALKPHOS 338* 12/18/2012 0620   AST 37* 01/18/2013 1003   AST 20 12/18/2012 0620   ALT 50 01/18/2013 1003   ALT 22 12/18/2012 0981  BILITOT 0.45 01/18/2013 1003   BILITOT 0.5 12/18/2012 0620       RADIOGRAPHIC STUDIES: No results found.   ASSESSMENT: [name] is a  62 y.o. woman with:  #1 Metastatic cervical carcinoma with liver metastasis. Patient has been treated with multiple therapeutic agents.  #2 Current therapy is with Alimta to be given every 21 days. She received cycle 1 on  07/21/2012.  Therapy was then discontinued due to complication after first cycle.  Now with progressive disease and resumed therapy with cycle 2 of 25%  dose reduced  Alimta on 10/26/2012.  Receiving Neulasta on day 2 of her treatment to avoid developing febrile neutropenia as she has previously.  #3 Depression and anxiety - On Celexa.  #4 Poor appetite - using Carnation Instant Breakfast and protein supplement ([place]jury chicken soup)  #5 DVT - On Xarelto  #6 Chronic left lower extremity lymphedema - Will inquire about physical therapy closer to her home in [place], [place].   PLAN:  #1 Patient is doing well.  I reviewed her labs with her in detail.  She will proceed with chemotherapy.  Evidently, in the past she has had issues with fluid overload due to IV hydration.  She will not receive it with this cycle, we will follow her closely due to her cardiac issues.    #2 I reviewed her scans with her today which demonstrated decrease in several liver lesions, other liver lesions were stable, slight increase in size of retrocaval lymph node within the upper abdomen, and slight increase in abnormal asymmetric soft tissue within the left external iliac region suspicious for local metastases.  along with Dr. Welton Flakes.   #3 Patient will return on 11/26 for close follow up.    All questions answered.  Mr. and Mrs. Crite were encouraged to contact us in the interim with any questions, concerns, or problems.  I spent 25 minutes counseling the patient face to face.  The total time spent in the appointment was 30 minutes.  Illa Level, NP Medical Oncology [facility] [phone]  01/19/2013   10:55 AM  ATTENDING'S ATTESTATION:  I personally reviewed patient's chart, examined patient myself, formulated the treatment plan as followed.    Overall patient is doing well. She will proceed with her scheduled chemotherapy. She does have great deal of difficulty with dehydration  however she also has a tendency to get fluid overload. We will hold her IV fluids with this cycle and monitor her closely. [place] CT scan results. She basically has some stable disease. She will continue to see [place] in one week's time  Drue Second, MD Medical/Oncology [facility] [phone] (beeper) [phone] (Office)  02/04/2013, 10:39 PM

## 2013-01-19 ENCOUNTER — Ambulatory Visit (HOSPITAL_BASED_OUTPATIENT_CLINIC_OR_DEPARTMENT_OTHER): Payer: 59

## 2013-01-19 ENCOUNTER — Telehealth: Payer: Self-pay | Admitting: Oncology

## 2013-01-19 VITALS — BP 126/70 | HR 78 | Temp 97.1°F

## 2013-01-19 DIAGNOSIS — C539 Malignant neoplasm of cervix uteri, unspecified: Secondary | ICD-10-CM

## 2013-01-19 DIAGNOSIS — Z5189 Encounter for other specified aftercare: Secondary | ICD-10-CM

## 2013-01-19 MED ORDER — PEGFILGRASTIM INJECTION 6 MG/0.6ML
6.0000 mg | Freq: Once | SUBCUTANEOUS | Status: AC
  Administered 2013-01-19: 6 mg via SUBCUTANEOUS
  Filled 2013-01-19: qty 0.6

## 2013-01-19 NOTE — Telephone Encounter (Signed)
, °

## 2013-01-19 NOTE — Patient Instructions (Signed)

## 2013-01-23 ENCOUNTER — Ambulatory Visit (HOSPITAL_BASED_OUTPATIENT_CLINIC_OR_DEPARTMENT_OTHER): Payer: 59 | Admitting: Family

## 2013-01-23 ENCOUNTER — Encounter: Payer: Self-pay | Admitting: Family

## 2013-01-23 ENCOUNTER — Telehealth: Payer: Self-pay | Admitting: *Deleted

## 2013-01-23 ENCOUNTER — Other Ambulatory Visit (HOSPITAL_BASED_OUTPATIENT_CLINIC_OR_DEPARTMENT_OTHER): Payer: 59 | Admitting: Lab

## 2013-01-23 VITALS — BP 104/72 | HR 98 | Temp 98.1°F | Resp 18 | Ht 61.0 in | Wt 170.2 lb

## 2013-01-23 DIAGNOSIS — C770 Secondary and unspecified malignant neoplasm of lymph nodes of head, face and neck: Secondary | ICD-10-CM

## 2013-01-23 DIAGNOSIS — C539 Malignant neoplasm of cervix uteri, unspecified: Secondary | ICD-10-CM

## 2013-01-23 DIAGNOSIS — C801 Malignant (primary) neoplasm, unspecified: Secondary | ICD-10-CM

## 2013-01-23 DIAGNOSIS — C787 Secondary malignant neoplasm of liver and intrahepatic bile duct: Secondary | ICD-10-CM

## 2013-01-23 DIAGNOSIS — I890 Lymphedema, not elsewhere classified: Secondary | ICD-10-CM

## 2013-01-23 DIAGNOSIS — K5900 Constipation, unspecified: Secondary | ICD-10-CM

## 2013-01-23 LAB — CBC WITH DIFFERENTIAL/PLATELET
BASO%: 0.8 % (ref 0.0–2.0)
Basophils Absolute: 0 10*3/uL (ref 0.0–0.1)
EOS%: 2.9 % (ref 0.0–7.0)
Eosinophils Absolute: 0.2 10*3/uL (ref 0.0–0.5)
HGB: 9.3 g/dL — ABNORMAL LOW (ref 11.6–15.9)
LYMPH%: 10 % — ABNORMAL LOW (ref 14.0–49.7)
MCH: 31.2 pg (ref 25.1–34.0)
MCHC: 32.9 g/dL (ref 31.5–36.0)
MCV: 95 fL (ref 79.5–101.0)
MONO%: 2.9 % (ref 0.0–14.0)
NEUT#: 4.9 10*3/uL (ref 1.5–6.5)
NEUT%: 83.4 % — ABNORMAL HIGH (ref 38.4–76.8)
Platelets: 217 10*3/uL (ref 145–400)
RBC: 2.98 10*6/uL — ABNORMAL LOW (ref 3.70–5.45)
RDW: 19.3 % — ABNORMAL HIGH (ref 11.2–14.5)
lymph#: 0.6 10*3/uL — ABNORMAL LOW (ref 0.9–3.3)

## 2013-01-23 LAB — COMPREHENSIVE METABOLIC PANEL (CC13)
ALT: 45 U/L (ref 0–55)
AST: 42 U/L — ABNORMAL HIGH (ref 5–34)
Albumin: 3.1 g/dL — ABNORMAL LOW (ref 3.5–5.0)
Alkaline Phosphatase: 417 U/L — ABNORMAL HIGH (ref 40–150)
Anion Gap: 8 mEq/L (ref 3–11)
Calcium: 9.3 mg/dL (ref 8.4–10.4)
Chloride: 104 mEq/L (ref 98–109)
Glucose: 87 mg/dl (ref 70–140)
Potassium: 4.6 mEq/L (ref 3.5–5.1)
Sodium: 136 mEq/L (ref 136–145)
Total Protein: 6.6 g/dL (ref 6.4–8.3)

## 2013-01-23 MED ORDER — FENTANYL 50 MCG/HR TD PT72: 50.0000 ug | MEDICATED_PATCH | TRANSDERMAL | Status: DC

## 2013-01-23 MED ORDER — DOCUSATE SODIUM 100 MG PO CAPS: 100.0000 mg | ORAL_CAPSULE | Freq: Two times a day (BID) | ORAL | Status: AC

## 2013-01-23 MED ORDER — FENTANYL 100 MCG/HR TD PT72: 100.0000 ug | MEDICATED_PATCH | TRANSDERMAL | Status: DC

## 2013-01-23 NOTE — Telephone Encounter (Signed)
appts made and printed. Pt is aware that tx will be added. i emailed MW to add the tx's...td 

## 2013-01-23 NOTE — Progress Notes (Addendum)
[facility]  Telephone:([phone] [phone]  OFFICE PROGRESS NOTE   ID: [name]   DOB: Sep 23, 1950  MR#: [id number]  JYN#:[id number]   PCP: [name], MD GYN ONC:  Emmaline Kluver, MD   DIAGNOSIS: [name] is a 62 y.o. female with metastatic cervical carcinoma.   PRIOR THERAPY: #1 [name] in March 2005 for a stage IB cervical cancer. Her final pathology showed no residual disease and all lymph nodes were negative.  #2 She was followed for 5 years with no evidence of recurrent disease.  She had a recurrence in the right pelvic sidewall in February 2010 which caused an obstruction of the right ureter. She was treated with radiation therapy and concurrent Cisplatinum chemotherapy.  #3 In late 2011, she was found to have recurrence of the right inguinal lymph node and left pelvic nodes.  She received additional radiation therapy to those areas. Radiation was completed in January 2012.   In April 2012 the patient had resolution of the right inguinal nodes.   #4 In August 2012 the patient was found to have a liver lesion which measured 2.8 cm.  She received 6 cycles of Taxol with a partial response to chemotherapy with a liver lesion decreasing in size to 1.6 cm in 12/2010 PET scan.  PET scan also revealed that there were no other lesions except the liver.  Patient elected to ablate the liver lesion using RFA he performed on 05/07/2011.  The procedure went well but patient developed an extensive DVT in the left lower extremity.  She started receiving anticoagulant therapy with Lovenox and an IVC filter was placed.  #5 Received radiation therapy in June, August and September 2013.   #6 In November 2013, she was found to have metastasis in left inguinal lymph nodes. She received low-dose chemotherapy consisting of Carboplatinum and Taxol in December and  January 2014.  Chemotherapy course was complicated by neutropenia.  CT scans in February 2014  Showed an increase in size of the liver metastasis.   #7  She was started on Alimta in May 2014.  She had a difficult time with the first cycle of chemotherapy and did not receive it again until 10/26/2012.   #8  Most recent restaging scans on 01/16/2013 showed in the CT of the abdomen and pelvis no evidence for thoracic metastasis.  There are several liver lesions which are decreased in size from the previous exam. Other lesions are stable. Slight increase in size of retrocaval lymph node within the upper abdomen. Slight increase in abnormal, asymmetric soft tissue within the left external iliac region, suspicious for local metastasis.  In the CT of the chest No axillary or supraclavicular adenopathy. Review of the visualized osseous structures is significant for mild multi level thoracic spondylosis. No aggressive lytic or sclerotic bone lesions  identified.   CURRENT THERAPY: Dose reduced Alimta (21 day cycles), with day 2 Neulasta.   Received Prolia injection on 01/04/2013.    INTERVAL HISTORY: [name] is a 62 y.o. 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  She is accompanied for today's office visit by her husband Buddy.  She has complaints of continuing fatigue, decreased appetite, chronic left leg swelling (left lower extremity lymphedema), constipation and right lower quadrant pain abdominal pain that is not well-controlled.  She states she's had an increase intake of ibuprofen which seems to  help her right lower quadrant abdominal pain  It will be recalled that [name] also has several comorbidities including a history of atrophic left kidney, congestive heart failure, atrial fibrillation, and lower extremity DVT.  Her interval history is otherwise stable.   MEDICAL HISTORY: Past Medical History  Diagnosis Date  .  Diastolic dysfunction   . MIGRAINE HEADACHE   . Atrial fibrillation     failed DCCV, CHADs2-prev pradaxa stopped due to hematuria with ureter issues/JJ   . Anemia   . HTN (hypertension)   . GLAUCOMA   . CARPAL TUNNEL SYNDROME, RIGHT   . Lymphedema     L>R leg from pelvic XRT/scarring  . Hepatic steatosis     CT scan 06/2009, 12/2009  . DVT (deep venous thrombosis) 10/2010    LLE, anticoag resumed  . CHF (congestive heart failure)     1 yr ago per pt  . Arthritis   . GERD (gastroesophageal reflux disease)   . Small kidney     left  . Radiation 10/11/11-11/19/11    5040 cGy in 28 fx's/periaortic  . Radiation 03/16/10-03/25/10    right inguinal node/left external iliac node  . Radiation 06/11/08-07/23/08    6000 cGy left pelvis  . Hyperlipidemia   . Leg swelling     left  . Cervical cancer dx 04/2003, recur 04/2008    s/p debulk (TAHBSO), chemo/XRT; recurrent dz L pelvis dx 2010 and liver met 09/2010 s/p RFA  . Osteoporosis with fracture 2014    compression fx, s/p KP ; started prolia 06/2012- narcotic pain mgmt    ALLERGIES:   Allergies  Allergen Reactions  . Codeine Rash and Other (See Comments)    Breathing issues  . Flecainide Acetate Other (See Comments)    Doesn't remember reaction  . Morphine Other (See Comments)     Hallucinations  . Propafenone Hcl Other (See Comments)    Leg cramps    MEDICATIONS:  Current Outpatient Prescriptions  Medication Sig Dispense Refill  . bisacodyl (DULCOLAX) 5 MG EC tablet Take 5 mg by mouth daily as needed for constipation.      . cetirizine (ZYRTEC) 10 MG tablet Take 10 mg by mouth every evening.       . citalopram (CELEXA) 20 MG tablet Take 20 mg by mouth every evening.       . diltiazem (CARDIZEM CD) 180 MG 24 hr capsule Take 1 capsule (180 mg total) by mouth daily. TAke 2-3 hours before of after Metoprolol  30 capsule  0  . fentaNYL (DURAGESIC - DOSED MCG/HR) 100 MCG/HR Place 1 patch (100 mcg total) onto the skin every 3 (three) days.  Total dose 125mg   10 patch  0  . fentaNYL (DURAGESIC - DOSED MCG/HR) 25 MCG/HR patch Place 1 patch (25 mcg total) onto the skin every 3 (three) days. Total dose of 125 mcg  10 patch  0  . ferrous fumarate (HEMOCYTE - 106 MG FE) 325 (106 FE) MG TABS Take 1 tablet (106 mg of iron total) by mouth daily.  30 each  0  . folic acid (FOLVITE) 1 MG tablet Take 1 tablet (1 mg total) by mouth every morning.  30 tablet  6  . HYDROmorphone (DILAUDID) 4 MG tablet Take 1 tablet every 4 hours as needed for pain  120 tablet  0  . ibuprofen (ADVIL,MOTRIN) 200 MG tablet Take 600 mg by mouth 3 (three) times daily as needed for pain. Pain      .  lidocaine-prilocaine (EMLA) cream Apply 1 application topically daily as needed (for port access).      . LORazepam (ATIVAN) 1 MG tablet Take 1 tablet (1 mg total) by mouth every 6 (six) hours as needed (for nausea (may put under tongue)).  30 tablet  2  . metoprolol tartrate (LOPRESSOR) 25 MG tablet Take 3 tablets (75 mg total) by mouth 2 (two) times daily.  180 tablet  2  . Nutritional Supplements (CARNATION INSTANT BREAKFAST PO) Take 1 Can by mouth 4 (four) times daily as needed (for meals).      . ondansetron (ZOFRAN) 8 MG tablet Take 1 tablet (8 mg total) by mouth every 8 (eight) hours as needed for nausea.  30 tablet  2  . pantoprazole (PROTONIX) 40 MG tablet Take 1 tablet (40 mg total) by mouth daily.  30 tablet  1  . polyethylene glycol (MIRALAX / GLYCOLAX) packet Take 17 g by mouth 2 (two) times daily as needed (constipation).       [name] PRESCRIPTION MEDICATION Inject into the vein every 21 ( twenty-one) days. PEMEtrexed (ALIMTA) 950 mg in sodium chloride 0.9 % 100 mL chemo infusion 500 mg/m2  1.91 m2 (Treatment Plan Actual) md Welton Flakes      . protein supplement (UNJURY CHICKEN SOUP) POWD Take 2 oz by mouth 4 (four) times daily as needed (for meals).      . Rivaroxaban (XARELTO) 20 MG TABS Take 1 tablet (20 mg total) by mouth every evening.  30 tablet  5  . sodium chloride  (OCEAN) 0.65 % nasal spray Place 1 spray into the nose daily as needed for congestion. Allergies      . triamcinolone (NASACORT) 55 MCG/ACT nasal inhaler Place 2 sprays into the nose daily as needed (allergies or congestion).       [name] zolpidem (AMBIEN) 5 MG tablet Take 1 tablet (5 mg total) by mouth at bedtime as needed for sleep.  20 tablet  0  . docusate sodium (COLACE) 100 MG capsule Take 1 capsule (100 mg total) by mouth 2 (two) times daily.  60 capsule  1  . fentaNYL (DURAGESIC - DOSED MCG/HR) 100 MCG/HR Place 1 patch (100 mcg total) onto the skin every 3 (three) days.  10 patch  0  . fentaNYL (DURAGESIC - DOSED MCG/HR) 50 MCG/HR Place 1 patch (50 mcg total) onto the skin every 3 (three) days.  10 patch  0   No current facility-administered medications for this visit.    SURGICAL HISTORY:  Past Surgical History  Procedure Laterality Date  . Ureteral stent placement  2005, 01/2010, 07/2010    L hydro related to cervical ca and XRT  . Oophorectomy  2005  . Cardioversion    . Tonsillectomy    . Cholecystectomy  1984  . Total abdominal hysterectomy  2005  . Carpal tunnel  2009    right  . Ivc filter  04/2011    REVIEW OF SYSTEMS:  A 10 point review of systems was conducted and is negative except as stated above.  [name] denies any other symptomatology including  fatigue, fever or chills, headache, vision changes, swollen glands, cough or shortness of breath, chest pain or discomfort, nausea, vomiting, any other changes in urinary or bowel habits, any other arthralgias/myalgias, unusual bleeding/bruising or any other symptomatology.   PHYSICAL EXAMINATION: Blood pressure 104/72, pulse 98, temperature 98.1 F (36.7 C), temperature source Oral, resp. rate 18, height 5\' 1"  (1.549 m), weight 170 lb 3.1 oz (77.199  kg). Body mass index is 32.17 kg/(m^2).  ECOG PERFORMANCE STATUS: 1 - Symptomatic but completely ambulatory  General appearance: Alert, cooperative, well nourished, no apparent  distress Head: Normocephalic, without obvious abnormality, atraumatic Eyes: Conjunctivae/corneas clear, PERRLA, EOMI Nose: Nares, septum and mucosa are normal, no drainage or sinus tenderness Neck: No adenopathy, supple, symmetrical, trachea midline, no tenderness Resp: Clear to auscultation bilaterally, no wheezes/rales/rhonchi Cardio: Irregularly irregular, no murmur, click, rub or gallop, +3 left lower extremity edema, right Port-A-Cath without signs of infection GI: Soft, distended, right lower quadrant tenderness, hypoactive bowel sounds, excessive habitus Skin: No rashes/lesions, skin warm and dry, no erythematous areas, no cyanosis, bilateral lower extremity varicose veins  M/S:  Atraumatic, limited strength and range of motion in left lower extremity, no clubbing, left lower extremity is visibly greater in size than right lower extremity  Lymph nodes: Cervical, supraclavicular, and axillary nodes normal Neurologic: Grossly normal, cranial nerves II through XII intact, alert and oriented x 3 Psych: Appropriate affect    LABORATORY DATA: Lab Results  Component Value Date   WBC 5.8 01/23/2013   HGB 9.3* 01/23/2013   HCT 28.3* 01/23/2013   MCV 95.0 01/23/2013   PLT 217 01/23/2013      Chemistry      Component Value Date/Time   NA 136 01/23/2013 1058   NA 135 12/18/2012 0620   K 4.6 01/23/2013 1058   K 4.0 12/18/2012 0620   CL 101 12/18/2012 0620   CL 108* 08/15/2012 1252   CO2 23 01/23/2013 1058   CO2 24 12/18/2012 0620   BUN 11.2 01/23/2013 1058   BUN 5* 12/18/2012 0620   CREATININE 0.8 01/23/2013 1058   CREATININE 0.76 12/18/2012 0620   CREATININE 1.45* 09/11/2010 1603      Component Value Date/Time   CALCIUM 9.3 01/23/2013 1058   CALCIUM 9.6 12/18/2012 0620   ALKPHOS 417* 01/23/2013 1058   ALKPHOS 338* 12/18/2012 0620   AST 42* 01/23/2013 1058   AST 20 12/18/2012 0620   ALT 45 01/23/2013 1058   ALT 22 12/18/2012 0620   BILITOT 0.54 01/23/2013 1058   BILITOT  0.5 12/18/2012 1610       RADIOGRAPHIC STUDIES: Ct Chest W Contrast 01/16/2013   CLINICAL DATA:  Metastatic cervical cancer  EXAM: CT CHEST, ABDOMEN, AND PELVIS WITH CONTRAST  TECHNIQUE: Multidetector CT imaging of the chest, abdomen and pelvis was performed following the standard protocol during bolus administration of intravenous contrast.  CONTRAST:  OMNIPAQUE IOHEXOL 300 MG/ML  SOLN  COMPARISON:  10/13/2012  FINDINGS: CT CHEST FINDINGS  There is no pleural effusion identified. No airspace consolidation identified. Scattered areas of scarring versus platelike atelectasis identified in both upper lobes. No suspicious nodule or mass noted.  The trachea appears patent and is midline. No enlarged mediastinal or hilar lymph nodes identified. Mild cardiac enlargement. No pericardial effusion. Calcifications involving the thoracic aorta noted.  No axillary or supraclavicular adenopathy. Review of the visualized osseous structures is significant for mild multi level thoracic spondylosis. No aggressive lytic or sclerotic bone lesions identified.  CT ABDOMEN AND PELVIS FINDINGS  Multiple liver metastases are again identified. The index lesion within the dome of liver measures 3.2 cm, image 41/ series 2. Previously 3.4 cm. Central right hepatic lobe lesion measures 3.4 cm, image 45/series 2. This is compared with 4.3 cm previously. Posterior right hepatic lobe lesion measures 4.1 cm, image 53/ series 2. Previously 3.9 cm. Anterior right hepatic lobe lesion measures 6.9 cm, image 52/ series 2.  Stable from previous exam. Slight increase an intrahepatic biliary dilatation. Prior cholecystectomy. The common bile duct is normal in caliber. Normal appearance of the pancreas. The spleen is unremarkable. Normal appearance of both adrenal glands. The right kidney is within normal limits. In stage left kidney is identified. Urinary bladder appears within normal limits. Previous hysterectomy.  Calcified atherosclerotic  disease affects the abdominal aorta. Index portacaval lymph node is stable measuring 8 mm. Retrocaval lymph node measures 1 cm and is increased in size from previous exam, image 55/ series 2. Previously this measured 5 mm. Central low attenuation, peripherally enhancing lymph node within the left external iliac lymph node chain is stable measuring 2 cm. There is an area of increase soft tissue attenuation measuring 2.5 cm, image number 98/series 2.  The stomach appears normal in appearance. The small bowel loops have a normal course and caliber. No obstruction. Normal appearance of the colon.  Review of the visualized osseous structures is significant for compression fractures involving the L1 L2-L3 and L4 vertebra. The L1 and L3 vertebra have been treated with bone cement.  IMPRESSION: 1. No evidence for thoracic metastasis. 2. There are several liver lesions which are decreased in size from the previous exam. Other lesions are stable. 3. Slight increase in size of retrocaval lymph node within the upper abdomen. 4. Slight increase in abnormal, asymmetric soft tissue within the left external iliac region, suspicious for local metastasis.   Electronically Signed   By: Signa Kell M.D.   On: 01/16/2013 09:06      ASSESSMENT: ASHTYN MELAND is a  62 y.o. woman with:  #1 Metastatic cervical carcinoma with liver metastasis. Patient has been treated with multiple therapeutic agents.  #2 Current therapy is with Alimta to be given every 21 days. She received cycle 1 on 07/21/2012.  Therapy was then discontinued due to complication after first cycle.  Now with progressive disease and resumed therapy with cycle 2 of 25%  dose reduced  Alimta on 10/26/2012.  Receiving Neulasta on day 2 of her treatment to avoid developing febrile neutropenia as she has previously.  #3 Depression and anxiety - On Celexa.  #4 Poor appetite - using Carnation Instant Breakfast and protein supplement (Unjury chicken soup)  #5 DVT  - On Xarelto  #6 Chronic left lower extremity lymphedema   #7  Right lower quadrant pain - receiving Dilaudid 4 mg by mouth every 4 hours, when necessary for pain and fentanyl patch increased to 150 mcg every 72 hours.  #8 Constipation -  bowel regimen including increased hydration with water, consumption of fruits/vegetables/fiber, and taking Colace by mouth twice a day was recommended.   PLAN:  #1 Therapy with dose reduced Alimta scheduled to resume on 02/08/2013 with Neulasta injection the following day.  We will check laboratories of CBC and CMP on that day.    #2  [name] was provided a written prescription for increased fentanyl patch dose (150 mcg Q72)  #10 with 0 refills of  fentanyl 100 mcg and #10 with 0 refills of fentanyl 50 mcg to be applied Q72 hours to total 150 mcg every 72 hours.  All questions answered.  Mr. and Mrs. Tavano were encouraged to contact us in the interim with any questions, concerns, or problems.  Larina Bras, NP-C 01/23/2013   6:57 PM    ATTENDING'S ATTESTATION:  I personally reviewed patient's chart, examined patient myself, formulated the treatment plan as followed.    Patient tolerated dose reduced Alimta very  well. She is still fatigued and tired but her counts are doing well. Alimta will remain dose reduced.   Drue Second, MD Medical/Oncology [facility] [phone] (beeper) [phone] (Office)  01/23/2013, 6:57 PM  ATTENDING'S ATTESTATION:  I personally reviewed patient's chart, examined patient myself, formulated the treatment plan as followed.    Patient with metastatic cervical carcinoma. She is now on Alimta tolerating it well with dose reduction. She is having considerable amount of pain he have increased her fentanyl patches 150 mcg every 72 hours.  She is encouraged to drink more get some rest. She will be seen back in 2 weeks' time for next dose of chemotherapy.  Drue Second,  MD Medical/Oncology [facility] [phone] (beeper) [phone] (Office)  01/31/2013, 10:09 AM

## 2013-01-23 NOTE — Patient Instructions (Addendum)
Results for orders placed in visit on 01/23/13 (from the past 24 hour(s))  CBC WITH DIFFERENTIAL     Status: Abnormal   Collection Time    01/23/13 10:58 AM      Result Value Range   WBC 5.8  3.9 - 10.3 10e3/uL   NEUT# 4.9  1.5 - 6.5 10e3/uL   HGB 9.3 (*) 11.6 - 15.9 g/dL   HCT 16.1 (*) 09.6 - 04.5 %   Platelets 217  145 - 400 10e3/uL   MCV 95.0  79.5 - 101.0 fL   MCH 31.2  25.1 - 34.0 pg   MCHC 32.9  31.5 - 36.0 g/dL   RBC 4.09 (*) 8.11 - 9.14 10e6/uL   RDW 19.3 (*) 11.2 - 14.5 %   lymph# 0.6 (*) 0.9 - 3.3 10e3/uL   MONO# 0.2  0.1 - 0.9 10e3/uL   Eosinophils Absolute 0.2  0.0 - 0.5 10e3/uL   Basophils Absolute 0.0  0.0 - 0.1 10e3/uL   NEUT% 83.4 (*) 38.4 - 76.8 %   LYMPH% 10.0 (*) 14.0 - 49.7 %   MONO% 2.9  0.0 - 14.0 %   EOS% 2.9  0.0 - 7.0 %   BASO% 0.8  0.0 - 2.0 %   Narrative:    Performed At:  [facility]               [address]               [place], [place] [address]  COMPREHENSIVE METABOLIC PANEL (CC13)     Status: Abnormal   Collection Time    01/23/13 10:58 AM      Result Value Range   Sodium 136  136 - 145 mEq/L   Potassium 4.6  3.5 - 5.1 mEq/L   Chloride 104  98 - 109 mEq/L   CO2 23  22 - 29 mEq/L   Glucose 87  70 - 140 mg/dl   BUN 62.1  7.0 - 30.8 mg/dL   Creatinine 0.8  0.6 - 1.1 mg/dL   Total Bilirubin 6.57  0.20 - 1.20 mg/dL   Alkaline Phosphatase 417 (*) 40 - 150 U/L   AST 42 (*) 5 - 34 U/L   ALT 45  0 - 55 U/L   Total Protein 6.6  6.4 - 8.3 g/dL   Albumin 3.1 (*) 3.5 - 5.0 g/dL   Calcium 9.3  8.4 - 84.6 mg/dL   Anion Gap 8  3 - 11 mEq/L   Narrative:    Performed At:  [facility]               [address]               [place], [place] [address]     Please contact us at [phone] if you have any questions or concerns.  Get plenty of rest, drink plenty of water, exercise daily (walking as tolerated), eat a balanced diet and continue to take nutritional supplements daily.  Fruits, fiber and vegetables are your  friend.  Take Miralax today.  Once you have a bowel movement.  Start taking Colace daily and twice daily if necessary to avoid straining when having a bowel movement.  [name]

## 2013-01-24 ENCOUNTER — Telehealth: Payer: Self-pay | Admitting: Oncology

## 2013-01-24 NOTE — Telephone Encounter (Signed)
Called pt and left message regarding chemo<md and labs , advise pt to get appt calendar for december

## 2013-01-26 ENCOUNTER — Telehealth: Payer: Self-pay | Admitting: *Deleted

## 2013-01-26 ENCOUNTER — Other Ambulatory Visit: Payer: 59 | Admitting: Lab

## 2013-01-26 NOTE — Telephone Encounter (Signed)
Per staff message and POF I have scheduled appts.  JMW  

## 2013-02-08 ENCOUNTER — Encounter: Payer: Self-pay | Admitting: Oncology

## 2013-02-08 ENCOUNTER — Telehealth: Payer: Self-pay | Admitting: *Deleted

## 2013-02-08 ENCOUNTER — Ambulatory Visit (HOSPITAL_BASED_OUTPATIENT_CLINIC_OR_DEPARTMENT_OTHER): Payer: 59 | Admitting: Oncology

## 2013-02-08 ENCOUNTER — Other Ambulatory Visit (HOSPITAL_BASED_OUTPATIENT_CLINIC_OR_DEPARTMENT_OTHER): Payer: 59

## 2013-02-08 ENCOUNTER — Ambulatory Visit (HOSPITAL_BASED_OUTPATIENT_CLINIC_OR_DEPARTMENT_OTHER): Payer: 59

## 2013-02-08 VITALS — BP 98/66 | HR 92 | Temp 97.9°F | Resp 18 | Ht 61.0 in | Wt 180.2 lb

## 2013-02-08 DIAGNOSIS — Z5111 Encounter for antineoplastic chemotherapy: Secondary | ICD-10-CM

## 2013-02-08 DIAGNOSIS — C539 Malignant neoplasm of cervix uteri, unspecified: Secondary | ICD-10-CM

## 2013-02-08 DIAGNOSIS — C787 Secondary malignant neoplasm of liver and intrahepatic bile duct: Secondary | ICD-10-CM

## 2013-02-08 DIAGNOSIS — I890 Lymphedema, not elsewhere classified: Secondary | ICD-10-CM

## 2013-02-08 DIAGNOSIS — C649 Malignant neoplasm of unspecified kidney, except renal pelvis: Secondary | ICD-10-CM

## 2013-02-08 DIAGNOSIS — Z86718 Personal history of other venous thrombosis and embolism: Secondary | ICD-10-CM

## 2013-02-08 DIAGNOSIS — C770 Secondary and unspecified malignant neoplasm of lymph nodes of head, face and neck: Secondary | ICD-10-CM

## 2013-02-08 LAB — CBC WITH DIFFERENTIAL/PLATELET
Basophils Absolute: 0.1 10*3/uL (ref 0.0–0.1)
EOS%: 2.3 % (ref 0.0–7.0)
Eosinophils Absolute: 0.1 10*3/uL (ref 0.0–0.5)
HGB: 9.1 g/dL — ABNORMAL LOW (ref 11.6–15.9)
LYMPH%: 11.3 % — ABNORMAL LOW (ref 14.0–49.7)
MCH: 32.6 pg (ref 25.1–34.0)
MCHC: 33 g/dL (ref 31.5–36.0)
MCV: 98.7 fL (ref 79.5–101.0)
MONO%: 14 % (ref 0.0–14.0)
NEUT#: 3.3 10*3/uL (ref 1.5–6.5)
Platelets: 336 10*3/uL (ref 145–400)
RBC: 2.8 10*6/uL — ABNORMAL LOW (ref 3.70–5.45)
RDW: 17.9 % — ABNORMAL HIGH (ref 11.2–14.5)
WBC: 4.7 10*3/uL (ref 3.9–10.3)
lymph#: 0.5 10*3/uL — ABNORMAL LOW (ref 0.9–3.3)

## 2013-02-08 LAB — COMPREHENSIVE METABOLIC PANEL (CC13)
AST: 60 U/L — ABNORMAL HIGH (ref 5–34)
Alkaline Phosphatase: 457 U/L — ABNORMAL HIGH (ref 40–150)
BUN: 15.5 mg/dL (ref 7.0–26.0)
Glucose: 90 mg/dl (ref 70–140)
Sodium: 138 mEq/L (ref 136–145)
Total Bilirubin: 0.5 mg/dL (ref 0.20–1.20)
Total Protein: 6.7 g/dL (ref 6.4–8.3)

## 2013-02-08 MED ORDER — ONDANSETRON 8 MG/NS 50 ML IVPB
INTRAVENOUS | Status: AC
  Filled 2013-02-08: qty 8

## 2013-02-08 MED ORDER — SODIUM CHLORIDE 0.9 % IV SOLN
500.0000 mg/m2 | Freq: Once | INTRAVENOUS | Status: AC
  Administered 2013-02-08: 725 mg via INTRAVENOUS
  Filled 2013-02-08: qty 29

## 2013-02-08 MED ORDER — SODIUM CHLORIDE 0.9 % IV SOLN
Freq: Once | INTRAVENOUS | Status: AC
  Administered 2013-02-08: 15:00:00 via INTRAVENOUS

## 2013-02-08 MED ORDER — HEPARIN SOD (PORK) LOCK FLUSH 100 UNIT/ML IV SOLN
500.0000 [IU] | Freq: Once | INTRAVENOUS | Status: AC | PRN
  Administered 2013-02-08: 500 [IU]
  Filled 2013-02-08: qty 5

## 2013-02-08 MED ORDER — DEXAMETHASONE SODIUM PHOSPHATE 10 MG/ML IJ SOLN
INTRAMUSCULAR | Status: AC
  Filled 2013-02-08: qty 1

## 2013-02-08 MED ORDER — ONDANSETRON 8 MG/50ML IVPB (CHCC)
8.0000 mg | Freq: Once | INTRAVENOUS | Status: AC
  Administered 2013-02-08: 8 mg via INTRAVENOUS

## 2013-02-08 MED ORDER — HYDROMORPHONE HCL 4 MG PO TABS: ORAL_TABLET | ORAL | Status: DC

## 2013-02-08 MED ORDER — LIDOCAINE-PRILOCAINE 2.5-2.5 % EX CREA: 1.0000 "application " | TOPICAL_CREAM | Freq: Every day | CUTANEOUS | Status: AC | PRN

## 2013-02-08 MED ORDER — DEXAMETHASONE SODIUM PHOSPHATE 10 MG/ML IJ SOLN
10.0000 mg | Freq: Once | INTRAMUSCULAR | Status: AC
  Administered 2013-02-08: 10 mg via INTRAVENOUS

## 2013-02-08 MED ORDER — SODIUM CHLORIDE 0.9 % IJ SOLN
10.0000 mL | INTRAMUSCULAR | Status: DC | PRN
  Administered 2013-02-08: 10 mL
  Filled 2013-02-08: qty 10

## 2013-02-08 NOTE — Telephone Encounter (Signed)
Per staff message and POF I have scheduled appts.  JMW  

## 2013-02-08 NOTE — Telephone Encounter (Signed)
appts made and printed. Pt is aware that tx will be added. i emailed MW to add the tx's...td 

## 2013-02-08 NOTE — Patient Instructions (Signed)
[facility] Discharge Instructions for Patients Receiving Chemotherapy  Today you received the following chemotherapy agent: Alimta   To help prevent nausea and vomiting after your treatment, we encourage you to take your nausea medication as prescribed.    If you develop nausea and vomiting that is not controlled by your nausea medication, call the clinic.   BELOW ARE SYMPTOMS THAT SHOULD BE REPORTED IMMEDIATELY:  *FEVER GREATER THAN 100.5 F  *CHILLS WITH OR WITHOUT FEVER  NAUSEA AND VOMITING THAT IS NOT CONTROLLED WITH YOUR NAUSEA MEDICATION  *UNUSUAL SHORTNESS OF BREATH  *UNUSUAL BRUISING OR BLEEDING  TENDERNESS IN MOUTH AND THROAT WITH OR WITHOUT PRESENCE OF ULCERS  *URINARY PROBLEMS  *BOWEL PROBLEMS  UNUSUAL RASH Items with * indicate a potential emergency and should be followed up as soon as possible.  Feel free to call the clinic you have any questions or concerns. The clinic phone number is [phone].    

## 2013-02-09 ENCOUNTER — Ambulatory Visit (HOSPITAL_BASED_OUTPATIENT_CLINIC_OR_DEPARTMENT_OTHER): Payer: 59

## 2013-02-09 VITALS — BP 115/76 | HR 93 | Temp 98.0°F

## 2013-02-09 DIAGNOSIS — C539 Malignant neoplasm of cervix uteri, unspecified: Secondary | ICD-10-CM

## 2013-02-09 DIAGNOSIS — Z5189 Encounter for other specified aftercare: Secondary | ICD-10-CM

## 2013-02-09 DIAGNOSIS — C787 Secondary malignant neoplasm of liver and intrahepatic bile duct: Secondary | ICD-10-CM

## 2013-02-09 MED ORDER — PEGFILGRASTIM INJECTION 6 MG/0.6ML
6.0000 mg | Freq: Once | SUBCUTANEOUS | Status: AC
  Administered 2013-02-09: 6 mg via SUBCUTANEOUS
  Filled 2013-02-09: qty 0.6

## 2013-02-09 NOTE — Patient Instructions (Signed)

## 2013-02-14 ENCOUNTER — Encounter: Payer: Self-pay | Admitting: Adult Health

## 2013-02-14 ENCOUNTER — Ambulatory Visit (HOSPITAL_BASED_OUTPATIENT_CLINIC_OR_DEPARTMENT_OTHER): Payer: 59 | Admitting: Adult Health

## 2013-02-14 ENCOUNTER — Other Ambulatory Visit (HOSPITAL_BASED_OUTPATIENT_CLINIC_OR_DEPARTMENT_OTHER): Payer: 59

## 2013-02-14 ENCOUNTER — Telehealth: Payer: Self-pay | Admitting: Dietician

## 2013-02-14 VITALS — BP 108/75 | HR 102 | Temp 99.1°F | Resp 20 | Ht 61.0 in | Wt 169.9 lb

## 2013-02-14 DIAGNOSIS — C770 Secondary and unspecified malignant neoplasm of lymph nodes of head, face and neck: Secondary | ICD-10-CM

## 2013-02-14 DIAGNOSIS — J069 Acute upper respiratory infection, unspecified: Secondary | ICD-10-CM

## 2013-02-14 DIAGNOSIS — C539 Malignant neoplasm of cervix uteri, unspecified: Secondary | ICD-10-CM

## 2013-02-14 DIAGNOSIS — C787 Secondary malignant neoplasm of liver and intrahepatic bile duct: Secondary | ICD-10-CM

## 2013-02-14 DIAGNOSIS — D518 Other vitamin B12 deficiency anemias: Secondary | ICD-10-CM

## 2013-02-14 LAB — CBC WITH DIFFERENTIAL/PLATELET
EOS%: 2.6 % (ref 0.0–7.0)
Eosinophils Absolute: 0.1 10*3/uL (ref 0.0–0.5)
HCT: 31.6 % — ABNORMAL LOW (ref 34.8–46.6)
LYMPH%: 17.3 % (ref 14.0–49.7)
MCV: 97.8 fL (ref 79.5–101.0)
MONO#: 0.5 10*3/uL (ref 0.1–0.9)
NEUT#: 2.8 10*3/uL (ref 1.5–6.5)
NEUT%: 66.8 % (ref 38.4–76.8)
Platelets: 177 10*3/uL (ref 145–400)
WBC: 4.2 10*3/uL (ref 3.9–10.3)

## 2013-02-14 LAB — COMPREHENSIVE METABOLIC PANEL (CC13)
Alkaline Phosphatase: 666 U/L — ABNORMAL HIGH (ref 40–150)
Anion Gap: 8 mEq/L (ref 3–11)
BUN: 11 mg/dL (ref 7.0–26.0)
CO2: 26 mEq/L (ref 22–29)
Chloride: 101 mEq/L (ref 98–109)
Creatinine: 0.8 mg/dL (ref 0.6–1.1)
Glucose: 97 mg/dl (ref 70–140)
Total Bilirubin: 1.03 mg/dL (ref 0.20–1.20)

## 2013-02-14 MED ORDER — FENTANYL 100 MCG/HR TD PT72: 100.0000 ug | MEDICATED_PATCH | TRANSDERMAL | Status: DC

## 2013-02-14 MED ORDER — ZOLPIDEM TARTRATE 5 MG PO TABS: 5.0000 mg | ORAL_TABLET | Freq: Every evening | ORAL | Status: AC | PRN

## 2013-02-14 MED ORDER — FENTANYL 50 MCG/HR TD PT72: 50.0000 ug | MEDICATED_PATCH | TRANSDERMAL | Status: DC

## 2013-02-14 MED ORDER — ZOLPIDEM TARTRATE 5 MG PO TABS: 5.0000 mg | ORAL_TABLET | Freq: Every evening | ORAL | Status: DC | PRN

## 2013-02-14 MED ORDER — AZITHROMYCIN 250 MG PO TABS: ORAL_TABLET | ORAL | Status: DC

## 2013-02-14 NOTE — Telephone Encounter (Signed)
Brief Outpatient Oncology Nutrition Note  Patient has been identified to be at risk on malnutrition screen.  Wt Readings from Last 10 Encounters:  02/14/13 169 lb 14.4 oz (77.066 kg)  02/08/13 180 lb 3.2 oz (81.738 kg)  01/23/13 170 lb 3.1 oz (77.199 kg)  01/18/13 168 lb 11.2 oz (76.522 kg)  01/04/13 172 lb 3.2 oz (78.109 kg)  12/28/12 176 lb 14.4 oz (80.241 kg)  12/27/12 174 lb 12.8 oz (79.289 kg)  12/17/12 163 lb 14.4 oz (74.345 kg)  12/14/12 162 lb (73.483 kg)  12/07/12 174 lb 11.2 oz (79.243 kg)    Patient with metastatic cervical cancer.  Last seen by inpatient RD 12/18/12.  At that time, patient was diagnosed with moderate malnutrition related to weight loss and decreased intake.  It appears that weight has increased since that time.  Tried to call patient who was unavailable.  [facility] contact information provided.    Oran Rein, RD, LDN

## 2013-02-14 NOTE — Progress Notes (Signed)
[facility]  [phone] [phone]  OFFICE PROGRESS NOTE   ID: [name]   DOB: June 15, 1950  MR#: [id number]  JYN#:[id number]   PCP: [name], MD GYN ONC:  Emmaline Kluver, MD   DIAGNOSIS: [name] is a 62 y.o. female with metastatic cervical carcinoma.   PRIOR THERAPY: #1 [name] in March 2005 for a stage IB cervical cancer. Her final pathology showed no residual disease and all lymph nodes were negative.  #2 She was followed for 5 years with no evidence of recurrent disease.  She had a recurrence in the right pelvic sidewall in February 2010 which caused an obstruction of the right ureter. She was treated with radiation therapy and concurrent Cisplatinum chemotherapy.  #3 In late 2011, she was found to have recurrence of the right inguinal lymph node and left pelvic nodes.  She received additional radiation therapy to those areas. Radiation was completed in January 2012.   In April 2012 the patient had resolution of the right inguinal nodes.   #4 In August 2012 the patient was found to have a liver lesion which measured 2.8 cm.  She received 6 cycles of Taxol with a partial response to chemotherapy with a liver lesion decreasing in size to 1.6 cm in 12/2010 PET scan.  PET scan also revealed that there were no other lesions except the liver.  Patient elected to ablate the liver lesion using RFA he performed on 05/07/2011.  The procedure went well but patient developed an extensive DVT in the left lower extremity.  She started receiving anticoagulant therapy with Lovenox and an IVC filter was placed.  #5 Received radiation therapy in June, August and September 2013.   #6 In November 2013, she was found to have metastasis in left inguinal lymph nodes. She received low-dose chemotherapy consisting of Carboplatinum and Taxol in December and  January 2014.  Chemotherapy course was complicated by neutropenia.  CT scans in February 2014  Showed an increase in size of the liver metastasis.   #7  She was started on Alimta in May 2014.  She had a difficult time with the first cycle of chemotherapy and did not receive it again until 10/26/2012.   #8  Most recent restaging scans on 01/16/2013 showed in the CT of the abdomen and pelvis no evidence for thoracic metastasis.  There are several liver lesions which are decreased in size from the previous exam. Other lesions are stable. Slight increase in size of retrocaval lymph node within the upper abdomen. Slight increase in abnormal, asymmetric soft tissue within the left external iliac region, suspicious for local metastasis.  In the CT of the chest No axillary or supraclavicular adenopathy. Review of the visualized osseous structures is significant for mild multi level thoracic spondylosis. No aggressive lytic or sclerotic bone lesions  identified.   CURRENT THERAPY:  Alimta (21 day cycles), with day 2 Neulasta.     INTERVAL HISTORY: [name] is a 62 y.o. female returns for followup today. Patient is feeling better. She tolerated the last dose reduced chemotherapy well. Patient is tired and somewhat weak but she does not want any fluids. Denies nausea, vomiting, fevers or chills. Remainder of 10 point review of systems is negative.  MEDICAL HISTORY: Past Medical History  Diagnosis Date  . Diastolic dysfunction   . MIGRAINE HEADACHE   . Atrial fibrillation     failed DCCV, CHADs2-prev pradaxa stopped  due to hematuria with ureter issues/JJ   . Anemia   . HTN (hypertension)   . GLAUCOMA   . CARPAL TUNNEL SYNDROME, RIGHT   . Lymphedema     L>R leg from pelvic XRT/scarring  . Hepatic steatosis     CT scan 06/2009, 12/2009  . DVT (deep venous thrombosis) 10/2010    LLE, anticoag resumed  . CHF (congestive heart failure)     1 yr ago per pt  . Arthritis   . GERD (gastroesophageal  reflux disease)   . Small kidney     left  . Radiation 10/11/11-11/19/11    5040 cGy in 28 fx's/periaortic  . Radiation 03/16/10-03/25/10    right inguinal node/left external iliac node  . Radiation 06/11/08-07/23/08    6000 cGy left pelvis  . Hyperlipidemia   . Leg swelling     left  . Cervical cancer dx 04/2003, recur 04/2008    s/p debulk (TAHBSO), chemo/XRT; recurrent dz L pelvis dx 2010 and liver met 09/2010 s/p RFA  . Osteoporosis with fracture 2014    compression fx, s/p KP ; started prolia 06/2012- narcotic pain mgmt    ALLERGIES:   Allergies  Allergen Reactions  . Codeine Rash and Other (See Comments)    Breathing issues  . Flecainide Acetate Other (See Comments)    Doesn't remember reaction  . Morphine Other (See Comments)     Hallucinations  . Propafenone Hcl Other (See Comments)    Leg cramps    MEDICATIONS:  Current Outpatient Prescriptions  Medication Sig Dispense Refill  . bisacodyl (DULCOLAX) 5 MG EC tablet Take 5 mg by mouth daily as needed for constipation.      . cetirizine (ZYRTEC) 10 MG tablet Take 10 mg by mouth every evening.       . citalopram (CELEXA) 20 MG tablet Take 20 mg by mouth every evening.       . diltiazem (CARDIZEM CD) 180 MG 24 hr capsule Take 1 capsule (180 mg total) by mouth daily. TAke 2-3 hours before of after Metoprolol  30 capsule  0  . docusate sodium (COLACE) 100 MG capsule Take 1 capsule (100 mg total) by mouth 2 (two) times daily.  60 capsule  1  . ferrous fumarate (HEMOCYTE - 106 MG FE) 325 (106 FE) MG TABS Take 1 tablet (106 mg of iron total) by mouth daily.  30 each  0  . folic acid (FOLVITE) 1 MG tablet Take 1 tablet (1 mg total) by mouth every morning.  30 tablet  6  . HYDROmorphone (DILAUDID) 4 MG tablet Take 1 tablet every 4 hours as needed for pain  120 tablet  0  . ibuprofen (ADVIL,MOTRIN) 200 MG tablet Take 600 mg by mouth 3 (three) times daily as needed for pain. Pain      . lidocaine-prilocaine (EMLA) cream Apply 1  application topically daily as needed (for port access).  30 g  6  . LORazepam (ATIVAN) 1 MG tablet Take 1 tablet (1 mg total) by mouth every 6 (six) hours as needed (for nausea (may put under tongue)).  30 tablet  2  . metoprolol tartrate (LOPRESSOR) 25 MG tablet Take 3 tablets (75 mg total) by mouth 2 (two) times daily.  180 tablet  2  . Nutritional Supplements (CARNATION INSTANT BREAKFAST PO) Take 1 Can by mouth 4 (four) times daily as needed (for meals).      . ondansetron (ZOFRAN) 8 MG tablet Take 1 tablet (8 mg total)  by mouth every 8 (eight) hours as needed for nausea.  30 tablet  2  . pantoprazole (PROTONIX) 40 MG tablet Take 1 tablet (40 mg total) by mouth daily.  30 tablet  1  . polyethylene glycol (MIRALAX / GLYCOLAX) packet Take 17 g by mouth 2 (two) times daily as needed (constipation).       [name] PRESCRIPTION MEDICATION Inject into the vein every 21 ( twenty-one) days. PEMEtrexed (ALIMTA) 950 mg in sodium chloride 0.9 % 100 mL chemo infusion 500 mg/m2  1.91 m2 (Treatment Plan Actual) md Welton Flakes      . protein supplement (UNJURY CHICKEN SOUP) POWD Take 2 oz by mouth 4 (four) times daily as needed (for meals).      . Rivaroxaban (XARELTO) 20 MG TABS Take 1 tablet (20 mg total) by mouth every evening.  30 tablet  5  . sodium chloride (OCEAN) 0.65 % nasal spray Place 1 spray into the nose daily as needed for congestion. Allergies      . triamcinolone (NASACORT) 55 MCG/ACT nasal inhaler Place 2 sprays into the nose daily as needed (allergies or congestion).       [name] azithromycin (ZITHROMAX) 250 MG tablet 2 tabs po on day one, then one tab daily until complete  6 each  0  . fentaNYL (DURAGESIC - DOSED MCG/HR) 100 MCG/HR Place 1 patch (100 mcg total) onto the skin every 3 (three) days. Total dose 125mg   10 patch  0  . fentaNYL (DURAGESIC - DOSED MCG/HR) 50 MCG/HR Place 1 patch (50 mcg total) onto the skin every 3 (three) days.  10 patch  0  . zolpidem (AMBIEN) 5 MG tablet Take 1 tablet (5 mg total)  by mouth at bedtime as needed for sleep.  20 tablet  0   No current facility-administered medications for this visit.    SURGICAL HISTORY:  Past Surgical History  Procedure Laterality Date  . Ureteral stent placement  2005, 01/2010, 07/2010    L hydro related to cervical ca and XRT  . Oophorectomy  2005  . Cardioversion    . Tonsillectomy    . Cholecystectomy  1984  . Total abdominal hysterectomy  2005  . Carpal tunnel  2009    right  . Ivc filter  04/2011    REVIEW OF SYSTEMS:  A 10 point review of systems was conducted and is negative except as stated above.  Mrs. Antony Madura denies any other symptomatology including  fatigue, fever or chills, headache, vision changes, swollen glands, cough or shortness of breath, chest pain or discomfort, nausea, vomiting, any other changes in urinary or bowel habits, any other arthralgias/myalgias, unusual bleeding/bruising or any other symptomatology.   PHYSICAL EXAMINATION: Blood pressure 98/66, pulse 92, temperature 97.9 F (36.6 C), temperature source Oral, resp. rate 18, height 5\' 1"  (1.549 m), weight 180 lb 3.2 oz (81.738 kg). Body mass index is 34.07 kg/(m^2).  ECOG PERFORMANCE STATUS: 1 - Symptomatic but completely ambulatory  General appearance: Alert, cooperative, well nourished, no apparent distress Head: Normocephalic, without obvious abnormality, atraumatic Eyes: Conjunctivae/corneas clear, PERRLA, EOMI Nose: Nares, septum and mucosa are normal, no drainage or sinus tenderness Neck: No adenopathy, supple, symmetrical, trachea midline, no tenderness Resp: Clear to auscultation bilaterally, no wheezes/rales/rhonchi Cardio: Irregularly irregular, no murmur, click, rub or gallop, +3 left lower extremity edema, right Port-A-Cath without signs of infection GI: Soft, distended, right lower quadrant tenderness, hypoactive bowel sounds, excessive habitus Skin: No rashes/lesions, skin warm and dry, no erythematous areas, no cyanosis,  bilateral  lower extremity varicose veins  M/S:  Atraumatic, limited strength and range of motion in left lower extremity, no clubbing, left lower extremity is visibly greater in size than right lower extremity  Lymph nodes: Cervical, supraclavicular, and axillary nodes normal Neurologic: Grossly normal, cranial nerves II through XII intact, alert and oriented x 3 Psych: Appropriate affect    LABORATORY DATA: Lab Results  Component Value Date   WBC 4.2 02/14/2013   HGB 10.1* 02/14/2013   HCT 31.6* 02/14/2013   MCV 97.8 02/14/2013   PLT 177 02/14/2013      Chemistry      Component Value Date/Time   NA 134* 02/14/2013 1231   NA 135 12/18/2012 0620   K 4.4 02/14/2013 1231   K 4.0 12/18/2012 0620   CL 101 12/18/2012 0620   CL 108* 08/15/2012 1252   CO2 26 02/14/2013 1231   CO2 24 12/18/2012 0620   BUN 11.0 02/14/2013 1231   BUN 5* 12/18/2012 0620   CREATININE 0.8 02/14/2013 1231   CREATININE 0.76 12/18/2012 0620   CREATININE 1.45* 09/11/2010 1603      Component Value Date/Time   CALCIUM 9.5 02/14/2013 1231   CALCIUM 9.6 12/18/2012 0620   ALKPHOS 666* 02/14/2013 1231   ALKPHOS 338* 12/18/2012 0620   AST 78* 02/14/2013 1231   AST 20 12/18/2012 0620   ALT 105* 02/14/2013 1231   ALT 22 12/18/2012 0620   BILITOT 1.03 02/14/2013 1231   BILITOT 0.5 12/18/2012 0454       RADIOGRAPHIC STUDIES: Ct Chest W Contrast 01/16/2013   CLINICAL DATA:  Metastatic cervical cancer  EXAM: CT CHEST, ABDOMEN, AND PELVIS WITH CONTRAST  TECHNIQUE: Multidetector CT imaging of the chest, abdomen and pelvis was performed following the standard protocol during bolus administration of intravenous contrast.  CONTRAST:  OMNIPAQUE IOHEXOL 300 MG/ML  SOLN  COMPARISON:  10/13/2012  FINDINGS: CT CHEST FINDINGS  There is no pleural effusion identified. No airspace consolidation identified. Scattered areas of scarring versus platelike atelectasis identified in both upper lobes. No suspicious nodule or mass noted.   The trachea appears patent and is midline. No enlarged mediastinal or hilar lymph nodes identified. Mild cardiac enlargement. No pericardial effusion. Calcifications involving the thoracic aorta noted.  No axillary or supraclavicular adenopathy. Review of the visualized osseous structures is significant for mild multi level thoracic spondylosis. No aggressive lytic or sclerotic bone lesions identified.  CT ABDOMEN AND PELVIS FINDINGS  Multiple liver metastases are again identified. The index lesion within the dome of liver measures 3.2 cm, image 41/ series 2. Previously 3.4 cm. Central right hepatic lobe lesion measures 3.4 cm, image 45/series 2. This is compared with 4.3 cm previously. Posterior right hepatic lobe lesion measures 4.1 cm, image 53/ series 2. Previously 3.9 cm. Anterior right hepatic lobe lesion measures 6.9 cm, image 52/ series 2. Stable from previous exam. Slight increase an intrahepatic biliary dilatation. Prior cholecystectomy. The common bile duct is normal in caliber. Normal appearance of the pancreas. The spleen is unremarkable. Normal appearance of both adrenal glands. The right kidney is within normal limits. In stage left kidney is identified. Urinary bladder appears within normal limits. Previous hysterectomy.  Calcified atherosclerotic disease affects the abdominal aorta. Index portacaval lymph node is stable measuring 8 mm. Retrocaval lymph node measures 1 cm and is increased in size from previous exam, image 55/ series 2. Previously this measured 5 mm. Central low attenuation, peripherally enhancing lymph node within the left external iliac lymph node  chain is stable measuring 2 cm. There is an area of increase soft tissue attenuation measuring 2.5 cm, image number 98/series 2.  The stomach appears normal in appearance. The small bowel loops have a normal course and caliber. No obstruction. Normal appearance of the colon.  Review of the visualized osseous structures is significant for  compression fractures involving the L1 L2-L3 and L4 vertebra. The L1 and L3 vertebra have been treated with bone cement.  IMPRESSION: 1. No evidence for thoracic metastasis. 2. There are several liver lesions which are decreased in size from the previous exam. Other lesions are stable. 3. Slight increase in size of retrocaval lymph node within the upper abdomen. 4. Slight increase in abnormal, asymmetric soft tissue within the left external iliac region, suspicious for local metastasis.   Electronically Signed   By: Signa Kell M.D.   On: 01/16/2013 09:06      ASSESSMENT: ERRIKA NARVAIZ is a  62 y.o. woman with:  #1 Metastatic cervical carcinoma with liver metastasis. Patient has been treated with multiple therapeutic agents.  #2 Current therapy is with Alimta to be given every 21 days. She received cycle 1 on 07/21/2012.  Therapy was then discontinued due to complication after first cycle.  Now with progressive disease and resumed therapy with cycle 2 of 25%  dose reduced  Alimta on 10/26/2012.  Receiving Neulasta on day 2 of her treatment to avoid developing febrile neutropenia as she has previously.  #3 Depression and anxiety - On Celexa.  #4 Poor appetite - using Carnation Instant Breakfast and protein supplement (Unjury chicken soup)  #5 DVT - On Xarelto  #6 Chronic left lower extremity lymphedema   #7  Right lower quadrant pain - receiving Dilaudid 4 mg by mouth every 4 hours, when necessary for pain and fentanyl patch increased to 150 mcg every 72 hours.  #8 Constipation -  bowel regimen including increased hydration with water, consumption of fruits/vegetables/fiber, and taking Colace by mouth twice a day was recommended.   PLAN:  #1 proceed with alimta dose reduced  #2 patient will return in 1 week for interim labs and office visit  The length of time of the face-to-face encounter was 30    minutes. More than 50% of time was spent counseling and coordination of  care.   Drue Second, MD Medical/Oncology [facility] [phone] (beeper) [phone] (Office)

## 2013-02-14 NOTE — Progress Notes (Signed)
[facility]  Telephone:([phone]) [phone] [phone]  OFFICE PROGRESS NOTE   ID: [name]   DOB: November 20, 1950  MR#: [id number]  ZHY#:[id number]   PCP: [name], MD GYN ONC:  Emmaline Kluver, MD   DIAGNOSIS: [name] is a 62 y.o. female with metastatic cervical carcinoma.   PRIOR THERAPY: #1 [name] in March 2005 for a stage IB cervical cancer. Her final pathology showed no residual disease and all lymph nodes were negative.  #2 She was followed for 5 years with no evidence of recurrent disease.  She had a recurrence in the right pelvic sidewall in February 2010 which caused an obstruction of the right ureter. She was treated with radiation therapy and concurrent Cisplatinum chemotherapy.  #3 In late 2011, she was found to have recurrence of the right inguinal lymph node and left pelvic nodes.  She received additional radiation therapy to those areas. Radiation was completed in January 2012.   In April 2012 the patient had resolution of the right inguinal nodes.   #4 In August 2012 the patient was found to have a liver lesion which measured 2.8 cm.  She received 6 cycles of Taxol with a partial response to chemotherapy with a liver lesion decreasing in size to 1.6 cm in 12/2010 PET scan.  PET scan also revealed that there were no other lesions except the liver.  Patient elected to ablate the liver lesion using RFA he performed on 05/07/2011.  The procedure went well but patient developed an extensive DVT in the left lower extremity.  She started receiving anticoagulant therapy with Lovenox and an IVC filter was placed.  #5 Received radiation therapy in June, August and September 2013.   #6 In November 2013, she was found to have metastasis in left inguinal lymph nodes. She received low-dose chemotherapy consisting of Carboplatinum and Taxol in December and  January 2014.  Chemotherapy course was complicated by neutropenia.  CT scans in February 2014  Showed an increase in size of the liver metastasis.   #7  She was started on Alimta in May 2014.  She had a difficult time with the first cycle of chemotherapy and did not receive it again until 10/26/2012.   #8  Most recent restaging scans on 01/16/2013 showed in the CT of the abdomen and pelvis no evidence for thoracic metastasis.  There are several liver lesions which are decreased in size from the previous exam. Other lesions are stable. Slight increase in size of retrocaval lymph node within the upper abdomen. Slight increase in abnormal, asymmetric soft tissue within the left external iliac region, suspicious for local metastasis.  In the CT of the chest No axillary or supraclavicular adenopathy. Review of the visualized osseous structures is significant for mild multi level thoracic spondylosis. No aggressive lytic or sclerotic bone lesions  identified.   CURRENT THERAPY: Dose reduced Alimta (21 day cycles), with day 2 Neulasta.   Received Prolia injection on 01/04/2013.    INTERVAL HISTORY: [name] is a 62 y.o. 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  She tolerated the Alimta this past week without difficulty.  She had one day of being fatigued, she had some mild nausea on the second day after receiving it, which was relieved with Zofran.  She developed an increase in nasal drainage and non-productive cough that started over the weekend.  Her pain  is controlled with the increased dose of the fentanyl patches.  The pain is mostly in her right upper quadrant, and she takes Dilaudid PRN as well for this.  She is taking the Dilaudid 3-4 times per day.  She denies fevers, chest pain, shortness of breath, constipation, diarrhea, numbness, or any further concerns.     MEDICAL HISTORY: Past Medical History  Diagnosis Date  .  Diastolic dysfunction   . MIGRAINE HEADACHE   . Atrial fibrillation     failed DCCV, CHADs2-prev pradaxa stopped due to hematuria with ureter issues/JJ   . Anemia   . HTN (hypertension)   . GLAUCOMA   . CARPAL TUNNEL SYNDROME, RIGHT   . Lymphedema     L>R leg from pelvic XRT/scarring  . Hepatic steatosis     CT scan 06/2009, 12/2009  . DVT (deep venous thrombosis) 10/2010    LLE, anticoag resumed  . CHF (congestive heart failure)     1 yr ago per pt  . Arthritis   . GERD (gastroesophageal reflux disease)   . Small kidney     left  . Radiation 10/11/11-11/19/11    5040 cGy in 28 fx's/periaortic  . Radiation 03/16/10-03/25/10    right inguinal node/left external iliac node  . Radiation 06/11/08-07/23/08    6000 cGy left pelvis  . Hyperlipidemia   . Leg swelling     left  . Cervical cancer dx 04/2003, recur 04/2008    s/p debulk (TAHBSO), chemo/XRT; recurrent dz L pelvis dx 2010 and liver met 09/2010 s/p RFA  . Osteoporosis with fracture 2014    compression fx, s/p KP ; started prolia 06/2012- narcotic pain mgmt    ALLERGIES:   Allergies  Allergen Reactions  . Codeine Rash and Other (See Comments)    Breathing issues  . Flecainide Acetate Other (See Comments)    Doesn't remember reaction  . Morphine Other (See Comments)     Hallucinations  . Propafenone Hcl Other (See Comments)    Leg cramps    MEDICATIONS:  Current Outpatient Prescriptions  Medication Sig Dispense Refill  . bisacodyl (DULCOLAX) 5 MG EC tablet Take 5 mg by mouth daily as needed for constipation.      . cetirizine (ZYRTEC) 10 MG tablet Take 10 mg by mouth every evening.       . citalopram (CELEXA) 20 MG tablet Take 20 mg by mouth every evening.       . diltiazem (CARDIZEM CD) 180 MG 24 hr capsule Take 1 capsule (180 mg total) by mouth daily. TAke 2-3 hours before of after Metoprolol  30 capsule  0  . docusate sodium (COLACE) 100 MG capsule Take 1 capsule (100 mg total) by mouth 2 (two) times daily.  60 capsule   1  . fentaNYL (DURAGESIC - DOSED MCG/HR) 100 MCG/HR Place 1 patch (100 mcg total) onto the skin every 3 (three) days. Total dose 125mg   10 patch  0  . fentaNYL (DURAGESIC - DOSED MCG/HR) 50 MCG/HR Place 1 patch (50 mcg total) onto the skin every 3 (three) days.  10 patch  0  . ferrous fumarate (HEMOCYTE - 106 MG FE) 325 (106 FE) MG TABS Take 1 tablet (106 mg of iron total) by mouth daily.  30 each  0  . folic acid (FOLVITE) 1 MG tablet Take 1 tablet (1 mg total) by mouth every morning.  30 tablet  6  . HYDROmorphone (DILAUDID) 4 MG tablet Take 1 tablet every 4 hours as needed  for pain  120 tablet  0  . ibuprofen (ADVIL,MOTRIN) 200 MG tablet Take 600 mg by mouth 3 (three) times daily as needed for pain. Pain      . lidocaine-prilocaine (EMLA) cream Apply 1 application topically daily as needed (for port access).  30 g  6  . LORazepam (ATIVAN) 1 MG tablet Take 1 tablet (1 mg total) by mouth every 6 (six) hours as needed (for nausea (may put under tongue)).  30 tablet  2  . metoprolol tartrate (LOPRESSOR) 25 MG tablet Take 3 tablets (75 mg total) by mouth 2 (two) times daily.  180 tablet  2  . Nutritional Supplements (CARNATION INSTANT BREAKFAST PO) Take 1 Can by mouth 4 (four) times daily as needed (for meals).      . pantoprazole (PROTONIX) 40 MG tablet Take 1 tablet (40 mg total) by mouth daily.  30 tablet  1  . PRESCRIPTION MEDICATION Inject into the vein every 21 ( twenty-one) days. PEMEtrexed (ALIMTA) 950 mg in sodium chloride 0.9 % 100 mL chemo infusion 500 mg/m2  1.91 m2 (Treatment Plan Actual) md Welton Flakes      . Rivaroxaban (XARELTO) 20 MG TABS Take 1 tablet (20 mg total) by mouth every evening.  30 tablet  5  . sodium chloride (OCEAN) 0.65 % nasal spray Place 1 spray into the nose daily as needed for congestion. Allergies      . triamcinolone (NASACORT) 55 MCG/ACT nasal inhaler Place 2 sprays into the nose daily as needed (allergies or congestion).       [name] zolpidem (AMBIEN) 5 MG tablet Take 1  tablet (5 mg total) by mouth at bedtime as needed for sleep.  20 tablet  0  . azithromycin (ZITHROMAX) 250 MG tablet 2 tabs po on day one, then one tab daily until complete  6 each  0  . ondansetron (ZOFRAN) 8 MG tablet Take 1 tablet (8 mg total) by mouth every 8 (eight) hours as needed for nausea.  30 tablet  2  . polyethylene glycol (MIRALAX / GLYCOLAX) packet Take 17 g by mouth 2 (two) times daily as needed (constipation).       . protein supplement (UNJURY CHICKEN SOUP) POWD Take 2 oz by mouth 4 (four) times daily as needed (for meals).       No current facility-administered medications for this visit.    SURGICAL HISTORY:  Past Surgical History  Procedure Laterality Date  . Ureteral stent placement  2005, 01/2010, 07/2010    L hydro related to cervical ca and XRT  . Oophorectomy  2005  . Cardioversion    . Tonsillectomy    . Cholecystectomy  1984  . Total abdominal hysterectomy  2005  . Carpal tunnel  2009    right  . Ivc filter  04/2011    REVIEW OF SYSTEMS:  A 10 point review of systems was conducted and is negative except as stated above.     PHYSICAL EXAMINATION: Blood pressure 108/75, pulse 102, temperature 99.1 F (37.3 C), temperature source Oral, resp. rate 20, height 5\' 1"  (1.549 m), weight 169 lb 14.4 oz (77.066 kg). Body mass index is 32.12 kg/(m^2). General: Patient is a well appearing female in no acute distress HEENT: PERRLA, sclerae anicteric no conjunctival pallor, MMM Neck: supple, no palpable adenopathy Lungs: clear to auscultation bilaterally, no wheezes, rhonchi, or rales Cardiovascular: regular rate rhythm, S1, S2, no murmurs, rubs or gallops Abdomen: Soft, non-tender, non-distended, normoactive bowel sounds, no HSM Extremities: warm and  well perfused, no clubbing, cyanosis, or edema Skin: No rashes or lesions Neuro: Non-focal ECOG PERFORMANCE STATUS: 1 - Symptomatic but completely ambulatory  LABORATORY DATA: Lab Results  Component Value Date   WBC  4.2 02/14/2013   HGB 10.1* 02/14/2013   HCT 31.6* 02/14/2013   MCV 97.8 02/14/2013   PLT 177 02/14/2013      Chemistry      Component Value Date/Time   NA 134* 02/14/2013 1231   NA 135 12/18/2012 0620   K 4.4 02/14/2013 1231   K 4.0 12/18/2012 0620   CL 101 12/18/2012 0620   CL 108* 08/15/2012 1252   CO2 26 02/14/2013 1231   CO2 24 12/18/2012 0620   BUN 11.0 02/14/2013 1231   BUN 5* 12/18/2012 0620   CREATININE 0.8 02/14/2013 1231   CREATININE 0.76 12/18/2012 0620   CREATININE 1.45* 09/11/2010 1603      Component Value Date/Time   CALCIUM 9.5 02/14/2013 1231   CALCIUM 9.6 12/18/2012 0620   ALKPHOS 666* 02/14/2013 1231   ALKPHOS 338* 12/18/2012 0620   AST 78* 02/14/2013 1231   AST 20 12/18/2012 0620   ALT 105* 02/14/2013 1231   ALT 22 12/18/2012 0620   BILITOT 1.03 02/14/2013 1231   BILITOT 0.5 12/18/2012 9604       RADIOGRAPHIC STUDIES: Ct Chest W Contrast 01/16/2013   CLINICAL DATA:  Metastatic cervical cancer  EXAM: CT CHEST, ABDOMEN, AND PELVIS WITH CONTRAST  TECHNIQUE: Multidetector CT imaging of the chest, abdomen and pelvis was performed following the standard protocol during bolus administration of intravenous contrast.  CONTRAST:  OMNIPAQUE IOHEXOL 300 MG/ML  SOLN  COMPARISON:  10/13/2012  FINDINGS: CT CHEST FINDINGS  There is no pleural effusion identified. No airspace consolidation identified. Scattered areas of scarring versus platelike atelectasis identified in both upper lobes. No suspicious nodule or mass noted.  The trachea appears patent and is midline. No enlarged mediastinal or hilar lymph nodes identified. Mild cardiac enlargement. No pericardial effusion. Calcifications involving the thoracic aorta noted.  No axillary or supraclavicular adenopathy. Review of the visualized osseous structures is significant for mild multi level thoracic spondylosis. No aggressive lytic or sclerotic bone lesions identified.  CT ABDOMEN AND PELVIS FINDINGS  Multiple liver  metastases are again identified. The index lesion within the dome of liver measures 3.2 cm, image 41/ series 2. Previously 3.4 cm. Central right hepatic lobe lesion measures 3.4 cm, image 45/series 2. This is compared with 4.3 cm previously. Posterior right hepatic lobe lesion measures 4.1 cm, image 53/ series 2. Previously 3.9 cm. Anterior right hepatic lobe lesion measures 6.9 cm, image 52/ series 2. Stable from previous exam. Slight increase an intrahepatic biliary dilatation. Prior cholecystectomy. The common bile duct is normal in caliber. Normal appearance of the pancreas. The spleen is unremarkable. Normal appearance of both adrenal glands. The right kidney is within normal limits. In stage left kidney is identified. Urinary bladder appears within normal limits. Previous hysterectomy.  Calcified atherosclerotic disease affects the abdominal aorta. Index portacaval lymph node is stable measuring 8 mm. Retrocaval lymph node measures 1 cm and is increased in size from previous exam, image 55/ series 2. Previously this measured 5 mm. Central low attenuation, peripherally enhancing lymph node within the left external iliac lymph node chain is stable measuring 2 cm. There is an area of increase soft tissue attenuation measuring 2.5 cm, image number 98/series 2.  The stomach appears normal in appearance. The small bowel loops have a normal course and  caliber. No obstruction. Normal appearance of the colon.  Review of the visualized osseous structures is significant for compression fractures involving the L1 L2-L3 and L4 vertebra. The L1 and L3 vertebra have been treated with bone cement.  IMPRESSION: 1. No evidence for thoracic metastasis. 2. There are several liver lesions which are decreased in size from the previous exam. Other lesions are stable. 3. Slight increase in size of retrocaval lymph node within the upper abdomen. 4. Slight increase in abnormal, asymmetric soft tissue within the left external iliac  region, suspicious for local metastasis.   Electronically Signed   By: Signa Kell M.D.   On: 01/16/2013 09:06      ASSESSMENT: [name] is a  62 y.o. woman with:  #1 Metastatic cervical carcinoma with liver metastasis. Patient has been treated with multiple therapeutic agents.  #2 Current therapy is with Alimta to be given every 21 days. She received cycle 1 on 07/21/2012.  Therapy was then discontinued due to complication after first cycle.  Now with progressive disease and resumed therapy with cycle 2 of 25%  dose reduced  Alimta on 10/26/2012.  Receiving Neulasta on day 2 of her treatment to avoid developing febrile neutropenia as she has previously.  #3 Depression and anxiety - On Celexa.  #4 Poor appetite - using Carnation Instant Breakfast and protein supplement ([place]jury chicken soup)  #5 DVT - On Xarelto  #6 Chronic left lower extremity lymphedema   #7  Right lower quadrant pain - receiving Dilaudid 4 mg by mouth every 4 hours, when necessary for pain and fentanyl patch increased to 150 mcg every 72 hours.  #8 Constipation -  bowel regimen including increased hydration with water, consumption of fruits/vegetables/fiber, and taking Colace by mouth twice a day was recommended.   PLAN:  #1 Ms. Zavadil is doing well.  She tolerated chemotherapy relatively well.  Her CBC is stable, I reviewed it with her in detail.    #2 I refilled her fentanyl patches today.    #3 She will return on 03/08/13 for labs, evaluation and chemotherapy.    All questions answered.  Mr. and Mrs. Vahle were encouraged to contact us in the interim with any questions, concerns, or problems.  I spent 25 minutes counseling the patient face to face.  The total time spent in the appointment was 30 minutes.   Illa Level, NP Medical Oncology [facility] [phone]  02/15/2013   3:22 PM

## 2013-03-02 ENCOUNTER — Ambulatory Visit: Payer: 59

## 2013-03-02 ENCOUNTER — Other Ambulatory Visit: Payer: 59

## 2013-03-02 ENCOUNTER — Ambulatory Visit: Payer: 59 | Admitting: Adult Health

## 2013-03-03 ENCOUNTER — Ambulatory Visit: Payer: 59

## 2013-03-07 ENCOUNTER — Encounter: Payer: Self-pay | Admitting: Oncology

## 2013-03-07 NOTE — Progress Notes (Signed)
Faxed clinical information to [employer] for patient's disability.

## 2013-03-08 ENCOUNTER — Ambulatory Visit (HOSPITAL_BASED_OUTPATIENT_CLINIC_OR_DEPARTMENT_OTHER): Payer: 59

## 2013-03-08 ENCOUNTER — Telehealth: Payer: Self-pay | Admitting: *Deleted

## 2013-03-08 ENCOUNTER — Other Ambulatory Visit (HOSPITAL_BASED_OUTPATIENT_CLINIC_OR_DEPARTMENT_OTHER): Payer: 59

## 2013-03-08 ENCOUNTER — Ambulatory Visit (HOSPITAL_BASED_OUTPATIENT_CLINIC_OR_DEPARTMENT_OTHER): Payer: 59 | Admitting: Oncology

## 2013-03-08 ENCOUNTER — Telehealth: Payer: Self-pay | Admitting: Oncology

## 2013-03-08 VITALS — BP 87/60 | HR 118 | Temp 98.1°F | Resp 18 | Ht 61.0 in | Wt 166.3 lb

## 2013-03-08 DIAGNOSIS — C787 Secondary malignant neoplasm of liver and intrahepatic bile duct: Secondary | ICD-10-CM

## 2013-03-08 DIAGNOSIS — C539 Malignant neoplasm of cervix uteri, unspecified: Secondary | ICD-10-CM

## 2013-03-08 DIAGNOSIS — C770 Secondary and unspecified malignant neoplasm of lymph nodes of head, face and neck: Secondary | ICD-10-CM

## 2013-03-08 DIAGNOSIS — C774 Secondary and unspecified malignant neoplasm of inguinal and lower limb lymph nodes: Secondary | ICD-10-CM

## 2013-03-08 DIAGNOSIS — F341 Dysthymic disorder: Secondary | ICD-10-CM

## 2013-03-08 DIAGNOSIS — K5900 Constipation, unspecified: Secondary | ICD-10-CM

## 2013-03-08 DIAGNOSIS — R197 Diarrhea, unspecified: Secondary | ICD-10-CM

## 2013-03-08 DIAGNOSIS — R1031 Right lower quadrant pain: Secondary | ICD-10-CM

## 2013-03-08 DIAGNOSIS — I890 Lymphedema, not elsewhere classified: Secondary | ICD-10-CM

## 2013-03-08 DIAGNOSIS — R112 Nausea with vomiting, unspecified: Secondary | ICD-10-CM

## 2013-03-08 DIAGNOSIS — I82409 Acute embolism and thrombosis of unspecified deep veins of unspecified lower extremity: Secondary | ICD-10-CM

## 2013-03-08 DIAGNOSIS — Z5111 Encounter for antineoplastic chemotherapy: Secondary | ICD-10-CM

## 2013-03-08 LAB — CBC WITH DIFFERENTIAL/PLATELET
BASO%: 0.9 % (ref 0.0–2.0)
Basophils Absolute: 0 10*3/uL (ref 0.0–0.1)
EOS ABS: 0.1 10*3/uL (ref 0.0–0.5)
EOS%: 2.7 % (ref 0.0–7.0)
HCT: 32.8 % — ABNORMAL LOW (ref 34.8–46.6)
HGB: 10.3 g/dL — ABNORMAL LOW (ref 11.6–15.9)
LYMPH#: 0.7 10*3/uL — AB (ref 0.9–3.3)
LYMPH%: 15.4 % (ref 14.0–49.7)
MCH: 31.2 pg (ref 25.1–34.0)
MCHC: 31.4 g/dL — ABNORMAL LOW (ref 31.5–36.0)
MCV: 99.4 fL (ref 79.5–101.0)
MONO#: 0.7 10*3/uL (ref 0.1–0.9)
MONO%: 14.7 % — ABNORMAL HIGH (ref 0.0–14.0)
NEUT%: 66.3 % (ref 38.4–76.8)
NEUTROS ABS: 2.9 10*3/uL (ref 1.5–6.5)
Platelets: 268 10*3/uL (ref 145–400)
RBC: 3.3 10*6/uL — ABNORMAL LOW (ref 3.70–5.45)
RDW: 15.1 % — ABNORMAL HIGH (ref 11.2–14.5)
WBC: 4.4 10*3/uL (ref 3.9–10.3)
nRBC: 0 % (ref 0–0)

## 2013-03-08 LAB — COMPREHENSIVE METABOLIC PANEL (CC13)
ALK PHOS: 715 U/L — AB (ref 40–150)
ALT: 44 U/L (ref 0–55)
AST: 29 U/L (ref 5–34)
Albumin: 3 g/dL — ABNORMAL LOW (ref 3.5–5.0)
Anion Gap: 9 mEq/L (ref 3–11)
BUN: 12.4 mg/dL (ref 7.0–26.0)
CO2: 22 mEq/L (ref 22–29)
Calcium: 9.1 mg/dL (ref 8.4–10.4)
Chloride: 106 mEq/L (ref 98–109)
Creatinine: 0.9 mg/dL (ref 0.6–1.1)
GLUCOSE: 100 mg/dL (ref 70–140)
Potassium: 4.8 mEq/L (ref 3.5–5.1)
Sodium: 137 mEq/L (ref 136–145)
TOTAL PROTEIN: 7.1 g/dL (ref 6.4–8.3)
Total Bilirubin: 1.12 mg/dL (ref 0.20–1.20)

## 2013-03-08 MED ORDER — ONDANSETRON 8 MG/NS 50 ML IVPB
INTRAVENOUS | Status: AC
  Filled 2013-03-08: qty 8

## 2013-03-08 MED ORDER — ONDANSETRON 8 MG/50ML IVPB (CHCC)
8.0000 mg | Freq: Once | INTRAVENOUS | Status: AC
  Administered 2013-03-08: 8 mg via INTRAVENOUS

## 2013-03-08 MED ORDER — SODIUM CHLORIDE 0.9 % IV SOLN
Freq: Once | INTRAVENOUS | Status: AC
  Administered 2013-03-08: 13:00:00 via INTRAVENOUS

## 2013-03-08 MED ORDER — SODIUM CHLORIDE 0.9 % IJ SOLN
10.0000 mL | INTRAMUSCULAR | Status: DC | PRN
  Administered 2013-03-08: 10 mL
  Filled 2013-03-08: qty 10

## 2013-03-08 MED ORDER — CYANOCOBALAMIN 1000 MCG/ML IJ SOLN
INTRAMUSCULAR | Status: AC
  Filled 2013-03-08: qty 1

## 2013-03-08 MED ORDER — CYANOCOBALAMIN 1000 MCG/ML IJ SOLN
1000.0000 ug | Freq: Once | INTRAMUSCULAR | Status: AC
  Administered 2013-03-08: 1000 ug via INTRAMUSCULAR

## 2013-03-08 MED ORDER — DEXAMETHASONE SODIUM PHOSPHATE 10 MG/ML IJ SOLN
INTRAMUSCULAR | Status: AC
  Filled 2013-03-08: qty 1

## 2013-03-08 MED ORDER — SODIUM CHLORIDE 0.9 % IV SOLN
400.0000 mg/m2 | Freq: Once | INTRAVENOUS | Status: AC
  Administered 2013-03-08: 725 mg via INTRAVENOUS
  Filled 2013-03-08: qty 29

## 2013-03-08 MED ORDER — HEPARIN SOD (PORK) LOCK FLUSH 100 UNIT/ML IV SOLN
500.0000 [IU] | Freq: Once | INTRAVENOUS | Status: AC | PRN
  Administered 2013-03-08: 500 [IU]
  Filled 2013-03-08: qty 5

## 2013-03-08 MED ORDER — DEXAMETHASONE SODIUM PHOSPHATE 10 MG/ML IJ SOLN
10.0000 mg | Freq: Once | INTRAMUSCULAR | Status: AC
  Administered 2013-03-08: 10 mg via INTRAVENOUS

## 2013-03-08 NOTE — Telephone Encounter (Signed)
Per staff message and POF I have scheduled appts.  JMW  

## 2013-03-08 NOTE — Telephone Encounter (Signed)
, °

## 2013-03-08 NOTE — Patient Instructions (Signed)
[facility] Discharge Instructions for Patients Receiving Chemotherapy  Today you received the following chemotherapy agents Alimta.   To help prevent nausea and vomiting after your treatment, we encourage you to take your nausea medication as prescribed.    If you develop nausea and vomiting that is not controlled by your nausea medication, call the clinic.   BELOW ARE SYMPTOMS THAT SHOULD BE REPORTED IMMEDIATELY:  *FEVER GREATER THAN 100.5 F  *CHILLS WITH OR WITHOUT FEVER  NAUSEA AND VOMITING THAT IS NOT CONTROLLED WITH YOUR NAUSEA MEDICATION  *UNUSUAL SHORTNESS OF BREATH  *UNUSUAL BRUISING OR BLEEDING  TENDERNESS IN MOUTH AND THROAT WITH OR WITHOUT PRESENCE OF ULCERS  *URINARY PROBLEMS  *BOWEL PROBLEMS  UNUSUAL RASH Items with * indicate a potential emergency and should be followed up as soon as possible.  Feel free to call the clinic should you have any questions or concerns. The clinic phone number is [phone].  It was my pleasure to take care of you today! Tina Kyran Connaughton, RN    

## 2013-03-09 ENCOUNTER — Ambulatory Visit (HOSPITAL_BASED_OUTPATIENT_CLINIC_OR_DEPARTMENT_OTHER): Payer: 59

## 2013-03-09 VITALS — BP 110/65 | HR 89 | Temp 97.8°F

## 2013-03-09 DIAGNOSIS — C787 Secondary malignant neoplasm of liver and intrahepatic bile duct: Secondary | ICD-10-CM

## 2013-03-09 DIAGNOSIS — Z5189 Encounter for other specified aftercare: Secondary | ICD-10-CM

## 2013-03-09 DIAGNOSIS — C539 Malignant neoplasm of cervix uteri, unspecified: Secondary | ICD-10-CM

## 2013-03-09 MED ORDER — PEGFILGRASTIM INJECTION 6 MG/0.6ML
6.0000 mg | Freq: Once | SUBCUTANEOUS | Status: AC
  Administered 2013-03-09: 6 mg via SUBCUTANEOUS
  Filled 2013-03-09: qty 0.6

## 2013-03-12 NOTE — Progress Notes (Signed)
[facility]  [phone] [phone]  OFFICE PROGRESS NOTE   ID: MAYSUN MEDITZ   DOB: 12-Feb-1951  MR#: [id number]  JXV#:[id number]   PCP: [name], MD GYN ONC:  Emmaline Kluver, MD   DIAGNOSIS: [name] is a 63 y.o. female with metastatic cervical carcinoma.   PRIOR THERAPY: #1 [name] in March 2005 for a stage IB cervical cancer. Her final pathology showed no residual disease and all lymph nodes were negative.  #2 She was followed for 5 years with no evidence of recurrent disease.  She had a recurrence in the right pelvic sidewall in February 2010 which caused an obstruction of the right ureter. She was treated with radiation therapy and concurrent Cisplatinum chemotherapy.  #3 In late 2011, she was found to have recurrence of the right inguinal lymph node and left pelvic nodes.  She received additional radiation therapy to those areas. Radiation was completed in January 2012.   In April 2012 the patient had resolution of the right inguinal nodes.   #4 In August 2012 the patient was found to have a liver lesion which measured 2.8 cm.  She received 6 cycles of Taxol with a partial response to chemotherapy with a liver lesion decreasing in size to 1.6 cm in 12/2010 PET scan.  PET scan also revealed that there were no other lesions except the liver.  Patient elected to ablate the liver lesion using RFA he performed on 05/07/2011.  The procedure went well but patient developed an extensive DVT in the left lower extremity.  She started receiving anticoagulant therapy with Lovenox and an IVC filter was placed.  #5 Received radiation therapy in June, August and September 2013.   #6 In November 2013, she was found to have metastasis in left inguinal lymph nodes. She received low-dose chemotherapy consisting of Carboplatinum and Taxol in December and  January 2014.  Chemotherapy course was complicated by neutropenia.  CT scans in February 2014  Showed an increase in size of the liver metastasis.   #7  She was started on Alimta in May 2014.  She had a difficult time with the first cycle of chemotherapy and did not receive it again until 10/26/2012.   #8  Most recent restaging scans on 01/16/2013 showed in the CT of the abdomen and pelvis no evidence for thoracic metastasis.  There are several liver lesions which are decreased in size from the previous exam. Other lesions are stable. Slight increase in size of retrocaval lymph node within the upper abdomen. Slight increase in abnormal, asymmetric soft tissue within the left external iliac region, suspicious for local metastasis.  In the CT of the chest No axillary or supraclavicular adenopathy. Review of the visualized osseous structures is significant for mild multi level thoracic spondylosis. No aggressive lytic or sclerotic bone lesions  identified.   CURRENT THERAPY: Dose reduced Alimta (21 day cycles), with day 2 Neulasta. CYCLE 8  Received Prolia injection on 01/04/2013.    INTERVAL HISTORY: [name] is a 63 y.o. 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  Patient is now here for cycle 8 of dose reduced ALIMTA. She seems to be tolerating it well. However she does experience significant side effects one week after. They resolve as time goes on. Today she feels well her blood count looks good. She has no nausea vomiting she does have fatigue.  She denies having any bleeding problems no aches or pains. Her pain is well-controlled with your present medications. [name] is negative.   MEDICAL HISTORY: Past Medical History  Diagnosis Date  . Diastolic dysfunction   . MIGRAINE HEADACHE   . Atrial fibrillation     failed DCCV, CHADs2-prev pradaxa stopped due to hematuria with ureter  issues/JJ   . Anemia   . HTN (hypertension)   . GLAUCOMA   . CARPAL TUNNEL SYNDROME, RIGHT   . Lymphedema     L>R leg from pelvic XRT/scarring  . Hepatic steatosis     CT scan 06/2009, 12/2009  . DVT (deep venous thrombosis) 10/2010    LLE, anticoag resumed  . CHF (congestive heart failure)     1 yr ago per pt  . Arthritis   . GERD (gastroesophageal reflux disease)   . Small kidney     left  . Radiation 10/11/11-11/19/11    5040 cGy in 28 fx's/periaortic  . Radiation 03/16/10-03/25/10    right inguinal node/left external iliac node  . Radiation 06/11/08-07/23/08    6000 cGy left pelvis  . Hyperlipidemia   . Leg swelling     left  . Cervical cancer dx 04/2003, recur 04/2008    s/p debulk (TAHBSO), chemo/XRT; recurrent dz L pelvis dx 2010 and liver met 09/2010 s/p RFA  . Osteoporosis with fracture 2014    compression fx, s/p KP ; started prolia 06/2012- narcotic pain mgmt    ALLERGIES:   Allergies  Allergen Reactions  . Codeine Rash and Other (See Comments)    Breathing issues  . Flecainide Acetate Other (See Comments)    Doesn't remember reaction  . Morphine Other (See Comments)     Hallucinations  . Propafenone Hcl Other (See Comments)    Leg cramps    MEDICATIONS:  Current Outpatient Prescriptions  Medication Sig Dispense Refill  . bisacodyl (DULCOLAX) 5 MG EC tablet Take 5 mg by mouth daily as needed for constipation.      . cetirizine (ZYRTEC) 10 MG tablet Take 10 mg by mouth every evening.       . citalopram (CELEXA) 20 MG tablet Take 20 mg by mouth every evening.       . diltiazem (CARDIZEM CD) 180 MG 24 hr capsule Take 1 capsule (180 mg total) by mouth daily. TAke 2-3 hours before of after Metoprolol  30 capsule  0  . docusate sodium (COLACE) 100 MG capsule Take 1 capsule (100 mg total) by mouth 2 (two) times daily.  60 capsule  1  . fentaNYL (DURAGESIC - DOSED MCG/HR) 100 MCG/HR Place 1 patch (100 mcg total) onto the skin every 3 (three) days. Total dose $RemoveBe'125mg'[id number]$   10  patch  0  . fentaNYL (DURAGESIC - DOSED MCG/HR) 50 MCG/HR Place 1 patch (50 mcg total) onto the skin every 3 (three) days.  10 patch  0  . ferrous fumarate (HEMOCYTE - 106 MG FE) 325 (106 FE) MG TABS Take 1 tablet (106 mg of iron total) by mouth daily.  30 each  0  . folic acid (FOLVITE) 1 MG tablet Take 1 tablet (1 mg total) by mouth every morning.  30 tablet  6  . HYDROmorphone (DILAUDID) 4 MG tablet Take 1 tablet every 4 hours as needed for pain  120 tablet  0  . ibuprofen (ADVIL,MOTRIN) 200 MG tablet Take 600 mg by mouth 3 (three) times daily as needed for pain. Pain      .  lidocaine-prilocaine (EMLA) cream Apply 1 application topically daily as needed (for port access).  30 g  6  . LORazepam (ATIVAN) 1 MG tablet Take 1 tablet (1 mg total) by mouth every 6 (six) hours as needed (for nausea (may put under tongue)).  30 tablet  2  . metoprolol tartrate (LOPRESSOR) 25 MG tablet Take 3 tablets (75 mg total) by mouth 2 (two) times daily.  180 tablet  2  . Nutritional Supplements (CARNATION INSTANT BREAKFAST PO) Take 1 Can by mouth 4 (four) times daily as needed (for meals).      . ondansetron (ZOFRAN) 8 MG tablet Take 1 tablet (8 mg total) by mouth every 8 (eight) hours as needed for nausea.  30 tablet  2  . pantoprazole (PROTONIX) 40 MG tablet Take 1 tablet (40 mg total) by mouth daily.  30 tablet  1  . polyethylene glycol (MIRALAX / GLYCOLAX) packet Take 17 g by mouth 2 (two) times daily as needed (constipation).       [name] PRESCRIPTION MEDICATION Inject into the vein every 21 ( twenty-one) days. PEMEtrexed (ALIMTA) 950 mg in sodium chloride 0.9 % 100 mL chemo infusion 500 mg/m2  1.91 m2 (Treatment Plan Actual) md Humphrey Rolls      . protein supplement (UNJURY CHICKEN SOUP) POWD Take 2 oz by mouth 4 (four) times daily as needed (for meals).      . Rivaroxaban (XARELTO) 20 MG TABS Take 1 tablet (20 mg total) by mouth every evening.  30 tablet  5  . sodium chloride (OCEAN) 0.65 % nasal spray Place 1 spray into  the nose daily as needed for congestion. Allergies      . triamcinolone (NASACORT) 55 MCG/ACT nasal inhaler Place 2 sprays into the nose daily as needed (allergies or congestion).       [name] zolpidem (AMBIEN) 5 MG tablet Take 1 tablet (5 mg total) by mouth at bedtime as needed for sleep.  20 tablet  0   No current facility-administered medications for this visit.    SURGICAL HISTORY:  Past Surgical History  Procedure Laterality Date  . Ureteral stent placement  2005, 01/2010, 07/2010    L hydro related to cervical ca and XRT  . Oophorectomy  2005  . Cardioversion    . Tonsillectomy    . Cholecystectomy  1984  . Total abdominal hysterectomy  2005  . Carpal tunnel  2009    right  . Ivc filter  04/2011    REVIEW OF SYSTEMS:  A 10 point review of systems was conducted and is negative except as stated above.     PHYSICAL EXAMINATION: Blood pressure 87/60, pulse 118, temperature 98.1 F (36.7 C), temperature source Oral, resp. rate 18, height $RemoveBe'5\' 1"'RNFeSRTKs$  (1.549 m), weight 166 lb 4.8 oz (75.433 kg). Body mass index is 31.44 kg/(m^2). General: Patient is a well appearing female in no acute distress HEENT: PERRLA, sclerae anicteric no conjunctival pallor, MMM Neck: supple, no palpable adenopathy Lungs: clear to auscultation bilaterally, no wheezes, rhonchi, or rales Cardiovascular: regular rate rhythm, S1, S2, no murmurs, rubs or gallops Abdomen: Soft, non-tender, non-distended, normoactive bowel sounds, no HSM Extremities: warm and well perfused, no clubbing, cyanosis, or edema Skin: No rashes or lesions Neuro: Non-focal ECOG PERFORMANCE STATUS: 1 - Symptomatic but completely ambulatory  LABORATORY DATA: Lab Results  Component Value Date   WBC 4.4 03/08/2013   HGB 10.3* 03/08/2013   HCT 32.8* 03/08/2013   MCV 99.4 03/08/2013   PLT 268 03/08/2013  Chemistry      Component Value Date/Time   NA 137 03/08/2013 1121   NA 135 12/18/2012 0620   K 4.8 03/08/2013 1121   K 4.0 12/18/2012 0620   CL  101 12/18/2012 0620   CL 108* 08/15/2012 1252   CO2 22 03/08/2013 1121   CO2 24 12/18/2012 0620   BUN 12.4 03/08/2013 1121   BUN 5* 12/18/2012 0620   CREATININE 0.9 03/08/2013 1121   CREATININE 0.76 12/18/2012 0620   CREATININE 1.45* 09/11/2010 1603      Component Value Date/Time   CALCIUM 9.1 03/08/2013 1121   CALCIUM 9.6 12/18/2012 0620   ALKPHOS 715* 03/08/2013 1121   ALKPHOS 338* 12/18/2012 0620   AST 29 03/08/2013 1121   AST 20 12/18/2012 0620   ALT 44 03/08/2013 1121   ALT 22 12/18/2012 0620   BILITOT 1.12 03/08/2013 1121   BILITOT 0.5 12/18/2012 0620       RADIOGRAPHIC STUDIES: Ct Chest W Contrast 01/16/2013   CLINICAL DATA:  Metastatic cervical cancer  EXAM: CT CHEST, ABDOMEN, AND PELVIS WITH CONTRAST  TECHNIQUE: Multidetector CT imaging of the chest, abdomen and pelvis was performed following the standard protocol during bolus administration of intravenous contrast.  CONTRAST:  125mL OMNIPAQUE IOHEXOL 300 MG/ML  SOLN  COMPARISON:  10/13/2012  FINDINGS: CT CHEST FINDINGS  There is no pleural effusion identified. No airspace consolidation identified. Scattered areas of scarring versus platelike atelectasis identified in both upper lobes. No suspicious nodule or mass noted.  The trachea appears patent and is midline. No enlarged mediastinal or hilar lymph nodes identified. Mild cardiac enlargement. No pericardial effusion. Calcifications involving the thoracic aorta noted.  No axillary or supraclavicular adenopathy. Review of the visualized osseous structures is significant for mild multi level thoracic spondylosis. No aggressive lytic or sclerotic bone lesions identified.  CT ABDOMEN AND PELVIS FINDINGS  Multiple liver metastases are again identified. The index lesion within the dome of liver measures 3.2 cm, image 41/ series 2. Previously 3.4 cm. Central right hepatic lobe lesion measures 3.4 cm, image 45/series 2. This is compared with 4.3 cm previously. Posterior right hepatic lobe lesion measures  4.1 cm, image 53/ series 2. Previously 3.9 cm. Anterior right hepatic lobe lesion measures 6.9 cm, image 52/ series 2. Stable from previous exam. Slight increase an intrahepatic biliary dilatation. Prior cholecystectomy. The common bile duct is normal in caliber. Normal appearance of the pancreas. The spleen is unremarkable. Normal appearance of both adrenal glands. The right kidney is within normal limits. In stage left kidney is identified. Urinary bladder appears within normal limits. Previous hysterectomy.  Calcified atherosclerotic disease affects the abdominal aorta. Index portacaval lymph node is stable measuring 8 mm. Retrocaval lymph node measures 1 cm and is increased in size from previous exam, image 55/ series 2. Previously this measured 5 mm. Central low attenuation, peripherally enhancing lymph node within the left external iliac lymph node chain is stable measuring 2 cm. There is an area of increase soft tissue attenuation measuring 2.5 cm, image number 98/series 2.  The stomach appears normal in appearance. The small bowel loops have a normal course and caliber. No obstruction. Normal appearance of the colon.  Review of the visualized osseous structures is significant for compression fractures involving the L1 L2-L3 and L4 vertebra. The L1 and L3 vertebra have been treated with bone cement.  IMPRESSION: 1. No evidence for thoracic metastasis. 2. There are several liver lesions which are decreased in size from the previous exam. Other  lesions are stable. 3. Slight increase in size of retrocaval lymph node within the upper abdomen. 4. Slight increase in abnormal, asymmetric soft tissue within the left external iliac region, suspicious for local metastasis.   Electronically Signed   By: Kerby Moors M.D.   On: 01/16/2013 09:06      ASSESSMENT: [name] is a  63 y.o. woman with:  #1 Metastatic cervical carcinoma with liver metastasis. Patient has been treated with multiple therapeutic  agents.  #2 Current therapy is with Alimta to be given every 21 days. She received cycle 1 on 07/21/2012.  Therapy was then discontinued due to complication after first cycle.  Now with progressive disease and resumed therapy with cycle 2 of 25%  dose reduced  Alimta on 10/26/2012.  Receiving Neulasta on day 2 of her treatment to avoid developing febrile neutropenia as she has previously.  #3 Depression and anxiety - On Celexa.  #4 Poor appetite - using Carnation Instant Breakfast and protein supplement (Unjury chicken soup)  #5 DVT - On Xarelto  #6 Chronic left lower extremity lymphedema   #7  Right lower quadrant pain - receiving Dilaudid 4 mg by mouth every 4 hours, when necessary for pain and fentanyl patch increased to 150 mcg every 72 hours.  #8 Constipation -  bowel regimen including increased hydration with water, consumption of fruits/vegetables/fiber, and taking Colace by mouth twice a day was recommended.   PLAN:  #1 proceed with cycle 8 day 1 of alimta. Patient understands risks benefits and side effects.  #2 she knows how to take her antinausea medications and I counseled her again about them. She does not want IV fluids at this point do to risk of becoming fluid overloaded and swelling in her lower extremities.  #3 she will be seen back in one week's time for followup.     All questions answered.  Mr. and Mrs. Cousins were encouraged to contact us in the interim with any questions, concerns, or problems.  I spent 20 minutes counseling the patient face to face.  The total time spent in the appointment was 30 minutes.   Marcy Panning, MD Medical/Oncology [facility] [phone] (beeper) [phone] (Office)  03/12/2013, 9:48 AM

## 2013-03-15 ENCOUNTER — Encounter: Payer: Self-pay | Admitting: Adult Health

## 2013-03-15 ENCOUNTER — Ambulatory Visit (HOSPITAL_BASED_OUTPATIENT_CLINIC_OR_DEPARTMENT_OTHER): Payer: 59 | Admitting: Adult Health

## 2013-03-15 ENCOUNTER — Other Ambulatory Visit (HOSPITAL_BASED_OUTPATIENT_CLINIC_OR_DEPARTMENT_OTHER): Payer: 59

## 2013-03-15 VITALS — BP 103/67 | HR 90 | Temp 98.0°F | Resp 18 | Ht 61.0 in | Wt 163.3 lb

## 2013-03-15 DIAGNOSIS — C787 Secondary malignant neoplasm of liver and intrahepatic bile duct: Secondary | ICD-10-CM

## 2013-03-15 DIAGNOSIS — C539 Malignant neoplasm of cervix uteri, unspecified: Secondary | ICD-10-CM

## 2013-03-15 DIAGNOSIS — C770 Secondary and unspecified malignant neoplasm of lymph nodes of head, face and neck: Secondary | ICD-10-CM

## 2013-03-15 DIAGNOSIS — R7989 Other specified abnormal findings of blood chemistry: Secondary | ICD-10-CM

## 2013-03-15 DIAGNOSIS — R109 Unspecified abdominal pain: Secondary | ICD-10-CM

## 2013-03-15 LAB — COMPREHENSIVE METABOLIC PANEL (CC13)
ALBUMIN: 2.9 g/dL — AB (ref 3.5–5.0)
ALT: 47 U/L (ref 0–55)
ANION GAP: 8 meq/L (ref 3–11)
AST: 31 U/L (ref 5–34)
Alkaline Phosphatase: 681 U/L — ABNORMAL HIGH (ref 40–150)
BUN: 15.4 mg/dL (ref 7.0–26.0)
CALCIUM: 9.1 mg/dL (ref 8.4–10.4)
CHLORIDE: 100 meq/L (ref 98–109)
CO2: 26 mEq/L (ref 22–29)
CREATININE: 0.8 mg/dL (ref 0.6–1.1)
GLUCOSE: 92 mg/dL (ref 70–140)
POTASSIUM: 4 meq/L (ref 3.5–5.1)
Sodium: 134 mEq/L — ABNORMAL LOW (ref 136–145)
Total Bilirubin: 1.32 mg/dL — ABNORMAL HIGH (ref 0.20–1.20)
Total Protein: 7 g/dL (ref 6.4–8.3)

## 2013-03-15 LAB — CBC WITH DIFFERENTIAL/PLATELET
BASO%: 0.2 % (ref 0.0–2.0)
BASOS ABS: 0 10*3/uL (ref 0.0–0.1)
EOS ABS: 0 10*3/uL (ref 0.0–0.5)
EOS%: 0.7 % (ref 0.0–7.0)
HEMATOCRIT: 29.2 % — AB (ref 34.8–46.6)
HGB: 9.9 g/dL — ABNORMAL LOW (ref 11.6–15.9)
LYMPH#: 0.6 10*3/uL — AB (ref 0.9–3.3)
LYMPH%: 10 % — ABNORMAL LOW (ref 14.0–49.7)
MCH: 33 pg (ref 25.1–34.0)
MCHC: 34 g/dL (ref 31.5–36.0)
MCV: 97.2 fL (ref 79.5–101.0)
MONO#: 0.5 10*3/uL (ref 0.1–0.9)
MONO%: 8.5 % (ref 0.0–14.0)
NEUT#: 4.8 10*3/uL (ref 1.5–6.5)
NEUT%: 80.6 % — ABNORMAL HIGH (ref 38.4–76.8)
Platelets: 161 10*3/uL (ref 145–400)
RBC: 3 10*6/uL — ABNORMAL LOW (ref 3.70–5.45)
RDW: 15 % — ABNORMAL HIGH (ref 11.2–14.5)
WBC: 6 10*3/uL (ref 3.9–10.3)

## 2013-03-15 NOTE — Progress Notes (Addendum)
[facility]  [phone] Fax:([phone]  OFFICE PROGRESS NOTE   ID: LARRISSA STIVERS   DOB: December 04, 1950  MR#: [id number]  JYN#:[id number]   PCP: Gwendolyn Grant, MD GYN ONC:  Threasa Heads, MD   DIAGNOSIS: LAURANNE BEYERSDORF is a 63 y.o. female with metastatic cervical carcinoma.   PRIOR THERAPY: #1 [name] in March 2005 for a stage IB cervical cancer. Her final pathology showed no residual disease and all lymph nodes were negative.  #2 She was followed for 5 years with no evidence of recurrent disease.  She had a recurrence in the right pelvic sidewall in February 2010 which caused an obstruction of the right ureter. s/p radiation therapy and concurrent Cisplatinum chemotherapy.  #3 In late 2011, she was found to have recurrence of the right inguinal lymph node and left pelvic nodes.  She received additional radiation therapy to those areas. Radiation was completed in January 2012.   In April 2012 the patient had resolution of the right inguinal nodes.   #4 In August 2012 the patient was found to have a liver lesion which measured 2.8 cm.  She received 6 cycles of Taxol with a partial response to chemotherapy with a liver lesion decreasing in size to 1.6 cm in 12/2010 PET scan.  PET scan also revealed that there were no other lesions except the liver.  Patient elected to ablate the liver lesion using RFA he performed on 05/07/2011.  The procedure went well but patient developed an extensive DVT in the left lower extremity.  She started receiving anticoagulant therapy with Lovenox and an IVC filter was placed.  #5 s/p radiation therapy in June, August and September 2013.   #6 In November 2013, she was found to have metastasis in left inguinal lymph nodes. S/p low-dose chemotherapy consisting of Carboplatinum and Taxol in December and January 2014.  Chemotherapy  course was complicated by neutropenia.  CT scans in February 2014  Showed an increase in size of the liver metastasis.   #7  She was started on Alimta in May 2014.  She had a difficult time with the first cycle of chemotherapy and did not receive it again until 10/26/2012.   #8  Most recent restaging scans on 01/16/2013 showed in the CT of the abdomen and pelvis no evidence for thoracic metastasis.  There are several liver lesions which are decreased in size from the previous exam. Other lesions are stable. Slight increase in size of retrocaval lymph node within the upper abdomen. Slight increase in abnormal, asymmetric soft tissue within the left external iliac region, suspicious for local metastasis.  In the CT of the chest No axillary or supraclavicular adenopathy. Review of the visualized osseous structures is significant for mild multi level thoracic spondylosis. No aggressive lytic or sclerotic bone lesions  identified.   CURRENT THERAPY: Dose reduced Alimta (21 day cycles), with day 2 Neulasta. CYCLE 8 day 8.  She received Prolia injection on 01/04/2013.    INTERVAL HISTORY: SRIYA KROEZE is a 63 y.o. 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  She has had a mild increase in her pain in her right upper quadrant after this cycle. She is on Fentanyl Patches for this, and she took Dilaudid and Ibuprofen which helped her pain.  She vomited twice, and had a low grade fever x 1 day where the max temp was 100.0.  She denies constipation, diarrhea, numbness, mucositis, or any other concerns.  She is drinking approximately 3 bottles of 16 ounce water in addition to milk, juice and other beverages throughout the day.  She denies feeling dizzy, chest pain, palpitations, or any other concerns.     MEDICAL HISTORY: Past Medical History  Diagnosis Date  . Diastolic dysfunction   . MIGRAINE HEADACHE   . Atrial fibrillation     failed  DCCV, CHADs2-prev pradaxa stopped due to hematuria with ureter issues/JJ   . Anemia   . HTN (hypertension)   . GLAUCOMA   . CARPAL TUNNEL SYNDROME, RIGHT   . Lymphedema     L>R leg from pelvic XRT/scarring  . Hepatic steatosis     CT scan 06/2009, 12/2009  . DVT (deep venous thrombosis) 10/2010    LLE, anticoag resumed  . CHF (congestive heart failure)     1 yr ago per pt  . Arthritis   . GERD (gastroesophageal reflux disease)   . Small kidney     left  . Radiation 10/11/11-11/19/11    5040 cGy in 28 fx's/periaortic  . Radiation 03/16/10-03/25/10    right inguinal node/left external iliac node  . Radiation 06/11/08-07/23/08    6000 cGy left pelvis  . Hyperlipidemia   . Leg swelling     left  . Cervical cancer dx 04/2003, recur 04/2008    s/p debulk (TAHBSO), chemo/XRT; recurrent dz L pelvis dx 2010 and liver met 09/2010 s/p RFA  . Osteoporosis with fracture 2014    compression fx, s/p KP ; started prolia 06/2012- narcotic pain mgmt    ALLERGIES:   Allergies  Allergen Reactions  . Codeine Rash and Other (See Comments)    Breathing issues  . Flecainide Acetate Other (See Comments)    Doesn't remember reaction  . Morphine Other (See Comments)     Hallucinations  . Propafenone Hcl Other (See Comments)    Leg cramps    MEDICATIONS:  Current Outpatient Prescriptions  Medication Sig Dispense Refill  . bisacodyl (DULCOLAX) 5 MG EC tablet Take 5 mg by mouth daily as needed for constipation.      . cetirizine (ZYRTEC) 10 MG tablet Take 10 mg by mouth every evening.       . citalopram (CELEXA) 20 MG tablet Take 20 mg by mouth every evening.       . diltiazem (CARDIZEM CD) 180 MG 24 hr capsule Take 1 capsule (180 mg total) by mouth daily. TAke 2-3 hours before of after Metoprolol  30 capsule  0  . docusate sodium (COLACE) 100 MG capsule Take 1 capsule (100 mg total) by mouth 2 (two) times daily.  60 capsule  1  . fentaNYL (DURAGESIC - DOSED MCG/HR) 100 MCG/HR Place 1 patch (100 mcg  total) onto the skin every 3 (three) days. Total dose $RemoveBe'125mg'LCZdKHHAh$   10 patch  0  . fentaNYL (DURAGESIC - DOSED MCG/HR) 50 MCG/HR Place 1 patch (50 mcg total) onto the skin every 3 (three) days.  10 patch  0  . ferrous fumarate (HEMOCYTE - 106 MG FE) 325 (106 FE) MG TABS Take 1 tablet (106 mg of iron total) by mouth daily.  30 each  0  . folic acid (FOLVITE) 1 MG tablet Take 1 tablet (1 mg total) by mouth every morning.  30 tablet  6  . HYDROmorphone (DILAUDID) 4 MG tablet Take 1 tablet every 4 hours as needed for pain  120 tablet  0  . lidocaine-prilocaine (EMLA)  cream Apply 1 application topically daily as needed (for port access).  30 g  6  . LORazepam (ATIVAN) 1 MG tablet Take 1 tablet (1 mg total) by mouth every 6 (six) hours as needed (for nausea (may put under tongue)).  30 tablet  2  . metoprolol tartrate (LOPRESSOR) 25 MG tablet Take 3 tablets (75 mg total) by mouth 2 (two) times daily.  180 tablet  2  . Nutritional Supplements (CARNATION INSTANT BREAKFAST PO) Take 1 Can by mouth 4 (four) times daily as needed (for meals).      . pantoprazole (PROTONIX) 40 MG tablet Take 1 tablet (40 mg total) by mouth daily.  30 tablet  1  . polyethylene glycol (MIRALAX / GLYCOLAX) packet Take 17 g by mouth 2 (two) times daily as needed (constipation).       [name] PRESCRIPTION MEDICATION Inject into the vein every 21 ( twenty-one) days. PEMEtrexed (ALIMTA) 950 mg in sodium chloride 0.9 % 100 mL chemo infusion 500 mg/m2  1.91 m2 (Treatment Plan Actual) md Humphrey Rolls      . protein supplement (UNJURY CHICKEN SOUP) POWD Take 2 oz by mouth 4 (four) times daily as needed (for meals).      . Rivaroxaban (XARELTO) 20 MG TABS Take 1 tablet (20 mg total) by mouth every evening.  30 tablet  5  . sodium chloride (OCEAN) 0.65 % nasal spray Place 1 spray into the nose daily as needed for congestion. Allergies      . triamcinolone (NASACORT) 55 MCG/ACT nasal inhaler Place 2 sprays into the nose daily as needed (allergies or congestion).        [name] zolpidem (AMBIEN) 5 MG tablet Take 1 tablet (5 mg total) by mouth at bedtime as needed for sleep.  20 tablet  0  . ibuprofen (ADVIL,MOTRIN) 200 MG tablet Take 600 mg by mouth 3 (three) times daily as needed for pain. Pain      . ondansetron (ZOFRAN) 8 MG tablet Take 1 tablet (8 mg total) by mouth every 8 (eight) hours as needed for nausea.  30 tablet  2   No current facility-administered medications for this visit.    SURGICAL HISTORY:  Past Surgical History  Procedure Laterality Date  . Ureteral stent placement  2005, 01/2010, 07/2010    L hydro related to cervical ca and XRT  . Oophorectomy  2005  . Cardioversion    . Tonsillectomy    . Cholecystectomy  1984  . Total abdominal hysterectomy  2005  . Carpal tunnel  2009    right  . Ivc filter  04/2011    REVIEW OF SYSTEMS:  A 10 point review of systems was conducted and is negative except as stated above.     PHYSICAL EXAMINATION: Blood pressure 103/67, pulse 90, temperature 98 F (36.7 C), temperature source Oral, resp. rate 18, height $RemoveBe'5\' 1"'IdWgPEzTI$  (1.549 m), weight 163 lb 4.8 oz (74.072 kg). Body mass index is 30.87 kg/(m^2). GENERAL: Patient is a well appearing female in no acute distress HEENT:  Sclerae anicteric.  Oropharynx clear and moist. No ulcerations or evidence of oropharyngeal candidiasis. Neck is supple.  NODES:  No cervical, supraclavicular, or axillary lymphadenopathy palpated.  BREAST EXAM:  Deferred. LUNGS:  Clear to auscultation bilaterally.  No wheezes or rhonchi. HEART:  Regular rate and rhythm. No murmur appreciated. ABDOMEN:  Soft, nontender.  Positive, normoactive bowel sounds. No organomegaly palpated. MSK:  No focal spinal tenderness to palpation. Full range of motion bilaterally in the  upper extremities. EXTREMITIES:  No peripheral edema.   SKIN:  Clear with no obvious rashes or skin changes. No nail dyscrasia. NEURO:  Nonfocal. Well oriented.  Appropriate affect. ECOG PERFORMANCE STATUS: 1 -  Symptomatic but completely ambulatory  LABORATORY DATA: Lab Results  Component Value Date   WBC 6.0 03/15/2013   HGB 9.9* 03/15/2013   HCT 29.2* 03/15/2013   MCV 97.2 03/15/2013   PLT 161 03/15/2013      Chemistry      Component Value Date/Time   NA 134* 03/15/2013 1015   NA 135 12/18/2012 0620   K 4.0 03/15/2013 1015   K 4.0 12/18/2012 0620   CL 101 12/18/2012 0620   CL 108* 08/15/2012 1252   CO2 26 03/15/2013 1015   CO2 24 12/18/2012 0620   BUN 15.4 03/15/2013 1015   BUN 5* 12/18/2012 0620   CREATININE 0.8 03/15/2013 1015   CREATININE 0.76 12/18/2012 0620   CREATININE 1.45* 09/11/2010 1603      Component Value Date/Time   CALCIUM 9.1 03/15/2013 1015   CALCIUM 9.6 12/18/2012 0620   ALKPHOS 681* 03/15/2013 1015   ALKPHOS 338* 12/18/2012 0620   AST 31 03/15/2013 1015   AST 20 12/18/2012 0620   ALT 47 03/15/2013 1015   ALT 22 12/18/2012 0620   BILITOT 1.32* 03/15/2013 1015   BILITOT 0.5 12/18/2012 7035       RADIOGRAPHIC STUDIES: Ct Chest W Contrast 01/16/2013   CLINICAL DATA:  Metastatic cervical cancer  EXAM: CT CHEST, ABDOMEN, AND PELVIS WITH CONTRAST  TECHNIQUE: Multidetector CT imaging of the chest, abdomen and pelvis was performed following the standard protocol during bolus administration of intravenous contrast.  CONTRAST:  172mL OMNIPAQUE IOHEXOL 300 MG/ML  SOLN  COMPARISON:  10/13/2012  FINDINGS: CT CHEST FINDINGS  There is no pleural effusion identified. No airspace consolidation identified. Scattered areas of scarring versus platelike atelectasis identified in both upper lobes. No suspicious nodule or mass noted.  The trachea appears patent and is midline. No enlarged mediastinal or hilar lymph nodes identified. Mild cardiac enlargement. No pericardial effusion. Calcifications involving the thoracic aorta noted.  No axillary or supraclavicular adenopathy. Review of the visualized osseous structures is significant for mild multi level thoracic spondylosis. No aggressive lytic or  sclerotic bone lesions identified.  CT ABDOMEN AND PELVIS FINDINGS  Multiple liver metastases are again identified. The index lesion within the dome of liver measures 3.2 cm, image 41/ series 2. Previously 3.4 cm. Central right hepatic lobe lesion measures 3.4 cm, image 45/series 2. This is compared with 4.3 cm previously. Posterior right hepatic lobe lesion measures 4.1 cm, image 53/ series 2. Previously 3.9 cm. Anterior right hepatic lobe lesion measures 6.9 cm, image 52/ series 2. Stable from previous exam. Slight increase an intrahepatic biliary dilatation. Prior cholecystectomy. The common bile duct is normal in caliber. Normal appearance of the pancreas. The spleen is unremarkable. Normal appearance of both adrenal glands. The right kidney is within normal limits. In stage left kidney is identified. Urinary bladder appears within normal limits. Previous hysterectomy.  Calcified atherosclerotic disease affects the abdominal aorta. Index portacaval lymph node is stable measuring 8 mm. Retrocaval lymph node measures 1 cm and is increased in size from previous exam, image 55/ series 2. Previously this measured 5 mm. Central low attenuation, peripherally enhancing lymph node within the left external iliac lymph node chain is stable measuring 2 cm. There is an area of increase soft tissue attenuation measuring 2.5 cm, image number 98/series 2.  The stomach appears normal in appearance. The small bowel loops have a normal course and caliber. No obstruction. Normal appearance of the colon.  Review of the visualized osseous structures is significant for compression fractures involving the L1 L2-L3 and L4 vertebra. The L1 and L3 vertebra have been treated with bone cement.  IMPRESSION: 1. No evidence for thoracic metastasis. 2. There are several liver lesions which are decreased in size from the previous exam. Other lesions are stable. 3. Slight increase in size of retrocaval lymph node within the upper abdomen. 4.  Slight increase in abnormal, asymmetric soft tissue within the left external iliac region, suspicious for local metastasis.   Electronically Signed   By: Kerby Moors M.D.   On: 01/16/2013 09:06      ASSESSMENT: KEGAN SHEPARDSON is a  62 y.o. woman with:  #1 Metastatic cervical carcinoma with liver metastasis. Patient has been treated with multiple therapeutic agents.  #2 Current therapy is with Alimta to be given every 21 days. She received cycle 1 on 07/21/2012.  Therapy was then discontinued due to complication after first cycle.  Now with progressive disease and resumed therapy with cycle 2 of 25%  dose reduced  Alimta on 10/26/2012.  Receiving Neulasta on day 2 of her treatment to avoid developing febrile neutropenia as she has previously.  #3 Depression and anxiety - On Celexa.  #4 Poor appetite - using Carnation Instant Breakfast and protein supplement (Unjury chicken soup)  #5 DVT - On Xarelto  #6 Chronic left lower extremity lymphedema   #7  Right lower quadrant pain - receiving Dilaudid 4 mg by mouth every 4 hours, when necessary for pain and fentanyl patch increased to 150 mcg every 72 hours.  #8 Constipation -  bowel regimen including increased hydration with water, consumption of fruits/vegetables/fiber, and taking Colace by mouth twice a day was recommended.   PLAN:  #1 Patient is cycle 8 day 8 of Alimta chemotherapy.  She had tolerated it relatively well.    #2 I reviewed her labs for the past 2 months.  Though her CMP hasn't returned today, her LFT's have been increased.  Most recently her Alk phos was in the 700s.  Her ALT and AST are normal, though have been elevated in the past.  I discussed this with pharmacy, who couldn't find a medication in particular.  Although Alimta can cause elevation in the alk phos, we will get an ultrasound of the abdomen and may possibly need to re-scan the patient for increased liver involvement/disease progression.  Her last scans were in  12/2012.    #3 she will be seen back in two weeks for labs, evaluation, and cycle 9 of Alimta.     All questions answered. Mrs. Sur was encouraged to contact us in the interim with any questions, concerns, or problems.  I spent 25 minutes counseling the patient face to face.  The total time spent in the appointment was 30 minutes.  Minette Headland, [place] [phone] 03/15/2013, 4:21 PM  ATTENDING'S ATTESTATION:  I personally reviewed patient's chart, examined patient myself, formulated the treatment plan as followed.   S/p chemotherapy consisting of alimta last week. Doing well but her alk phos is elevated. This is concerning for possible drug toxicity or progression. Obtain ultrasound liver and if needed CT of chest abdomen pelvis for re-staging purposes  Marcy Panning, MD Medical/Oncology [facility] [phone] (beeper) [phone] (Office)  03/24/2013, 4:33 PM

## 2013-03-23 ENCOUNTER — Other Ambulatory Visit: Payer: Self-pay | Admitting: *Deleted

## 2013-03-23 ENCOUNTER — Ambulatory Visit (HOSPITAL_COMMUNITY)
Admission: RE | Admit: 2013-03-23 | Discharge: 2013-03-23 | Disposition: A | Discharge status: Home / Self Care | Payer: 59 | Source: Ambulatory Visit | Attending: Adult Health | Admitting: Adult Health

## 2013-03-23 DIAGNOSIS — C539 Malignant neoplasm of cervix uteri, unspecified: Secondary | ICD-10-CM

## 2013-03-23 DIAGNOSIS — C787 Secondary malignant neoplasm of liver and intrahepatic bile duct: Secondary | ICD-10-CM | POA: Insufficient documentation

## 2013-03-23 DIAGNOSIS — K838 Other specified diseases of biliary tract: Secondary | ICD-10-CM | POA: Diagnosis not present

## 2013-03-23 DIAGNOSIS — R7401 Elevation of levels of liver transaminase levels: Secondary | ICD-10-CM | POA: Insufficient documentation

## 2013-03-23 DIAGNOSIS — N269 Renal sclerosis, unspecified: Secondary | ICD-10-CM | POA: Diagnosis not present

## 2013-03-23 DIAGNOSIS — R7402 Elevation of levels of lactic acid dehydrogenase (LDH): Secondary | ICD-10-CM | POA: Insufficient documentation

## 2013-03-23 DIAGNOSIS — R740 Nonspecific elevation of levels of transaminase and lactic acid dehydrogenase [LDH]: Secondary | ICD-10-CM

## 2013-03-23 DIAGNOSIS — K7689 Other specified diseases of liver: Secondary | ICD-10-CM | POA: Insufficient documentation

## 2013-03-23 DIAGNOSIS — R109 Unspecified abdominal pain: Secondary | ICD-10-CM | POA: Diagnosis present

## 2013-03-23 MED ORDER — HYDROMORPHONE HCL 4 MG PO TABS: ORAL_TABLET | ORAL | Status: DC

## 2013-03-23 MED ORDER — PANTOPRAZOLE SODIUM 40 MG PO TBEC: 40.0000 mg | DELAYED_RELEASE_TABLET | Freq: Every day | ORAL | Status: AC

## 2013-03-23 NOTE — Telephone Encounter (Signed)
Rx given to patient spouse.

## 2013-03-29 ENCOUNTER — Other Ambulatory Visit: Payer: Self-pay

## 2013-03-29 ENCOUNTER — Ambulatory Visit: Payer: 59

## 2013-03-29 ENCOUNTER — Encounter: Payer: Self-pay | Admitting: Oncology

## 2013-03-29 ENCOUNTER — Other Ambulatory Visit: Payer: Self-pay | Admitting: *Deleted

## 2013-03-29 ENCOUNTER — Telehealth: Payer: Self-pay | Admitting: Oncology

## 2013-03-29 ENCOUNTER — Other Ambulatory Visit (HOSPITAL_BASED_OUTPATIENT_CLINIC_OR_DEPARTMENT_OTHER): Payer: 59

## 2013-03-29 ENCOUNTER — Telehealth: Payer: Self-pay | Admitting: *Deleted

## 2013-03-29 ENCOUNTER — Ambulatory Visit (HOSPITAL_BASED_OUTPATIENT_CLINIC_OR_DEPARTMENT_OTHER): Payer: 59 | Admitting: Oncology

## 2013-03-29 VITALS — BP 114/74 | HR 80 | Temp 97.7°F | Resp 18 | Ht 61.0 in | Wt 166.0 lb

## 2013-03-29 DIAGNOSIS — C539 Malignant neoplasm of cervix uteri, unspecified: Secondary | ICD-10-CM

## 2013-03-29 DIAGNOSIS — R1031 Right lower quadrant pain: Secondary | ICD-10-CM

## 2013-03-29 DIAGNOSIS — C770 Secondary and unspecified malignant neoplasm of lymph nodes of head, face and neck: Secondary | ICD-10-CM

## 2013-03-29 DIAGNOSIS — C787 Secondary malignant neoplasm of liver and intrahepatic bile duct: Secondary | ICD-10-CM

## 2013-03-29 DIAGNOSIS — I4891 Unspecified atrial fibrillation: Secondary | ICD-10-CM

## 2013-03-29 DIAGNOSIS — R5381 Other malaise: Secondary | ICD-10-CM

## 2013-03-29 DIAGNOSIS — I890 Lymphedema, not elsewhere classified: Secondary | ICD-10-CM

## 2013-03-29 DIAGNOSIS — R5383 Other fatigue: Secondary | ICD-10-CM

## 2013-03-29 DIAGNOSIS — F341 Dysthymic disorder: Secondary | ICD-10-CM

## 2013-03-29 DIAGNOSIS — I82409 Acute embolism and thrombosis of unspecified deep veins of unspecified lower extremity: Secondary | ICD-10-CM

## 2013-03-29 DIAGNOSIS — C649 Malignant neoplasm of unspecified kidney, except renal pelvis: Secondary | ICD-10-CM

## 2013-03-29 DIAGNOSIS — K5900 Constipation, unspecified: Secondary | ICD-10-CM

## 2013-03-29 LAB — CBC WITH DIFFERENTIAL/PLATELET
BASO%: 0.2 % (ref 0.0–2.0)
BASOS ABS: 0 10*3/uL (ref 0.0–0.1)
EOS%: 0.7 % (ref 0.0–7.0)
Eosinophils Absolute: 0 10*3/uL (ref 0.0–0.5)
HEMATOCRIT: 30.2 % — AB (ref 34.8–46.6)
HGB: 9.8 g/dL — ABNORMAL LOW (ref 11.6–15.9)
LYMPH%: 11.2 % — ABNORMAL LOW (ref 14.0–49.7)
MCH: 31.6 pg (ref 25.1–34.0)
MCHC: 32.5 g/dL (ref 31.5–36.0)
MCV: 97.4 fL (ref 79.5–101.0)
MONO#: 0.7 10*3/uL (ref 0.1–0.9)
MONO%: 12.5 % (ref 0.0–14.0)
NEUT#: 4.2 10*3/uL (ref 1.5–6.5)
NEUT%: 75.4 % (ref 38.4–76.8)
Platelets: 312 10*3/uL (ref 145–400)
RBC: 3.1 10*6/uL — ABNORMAL LOW (ref 3.70–5.45)
RDW: 16.1 % — AB (ref 11.2–14.5)
WBC: 5.6 10*3/uL (ref 3.9–10.3)
lymph#: 0.6 10*3/uL — ABNORMAL LOW (ref 0.9–3.3)
nRBC: 0 % (ref 0–0)

## 2013-03-29 LAB — COMPREHENSIVE METABOLIC PANEL (CC13)
ALBUMIN: 2.7 g/dL — AB (ref 3.5–5.0)
ALT: 105 U/L — ABNORMAL HIGH (ref 0–55)
AST: 77 U/L — AB (ref 5–34)
Alkaline Phosphatase: 918 U/L — ABNORMAL HIGH (ref 40–150)
Anion Gap: 8 mEq/L (ref 3–11)
BUN: 10.7 mg/dL (ref 7.0–26.0)
CHLORIDE: 104 meq/L (ref 98–109)
CO2: 23 mEq/L (ref 22–29)
Calcium: 9.3 mg/dL (ref 8.4–10.4)
Creatinine: 0.7 mg/dL (ref 0.6–1.1)
GLUCOSE: 90 mg/dL (ref 70–140)
POTASSIUM: 4.5 meq/L (ref 3.5–5.1)
Sodium: 136 mEq/L (ref 136–145)
TOTAL PROTEIN: 6.8 g/dL (ref 6.4–8.3)
Total Bilirubin: 3 mg/dL — ABNORMAL HIGH (ref 0.20–1.20)

## 2013-03-29 MED ORDER — HEPARIN SOD (PORK) LOCK FLUSH 100 UNIT/ML IV SOLN
500.0000 [IU] | Freq: Once | INTRAVENOUS | Status: AC
  Administered 2013-03-29: 500 [IU] via INTRAVENOUS
  Filled 2013-03-29: qty 5

## 2013-03-29 MED ORDER — FENTANYL 50 MCG/HR TD PT72: 50.0000 ug | MEDICATED_PATCH | TRANSDERMAL | Status: DC

## 2013-03-29 MED ORDER — SODIUM CHLORIDE 0.9 % IJ SOLN
10.0000 mL | INTRAMUSCULAR | Status: DC | PRN
  Administered 2013-03-29: 10 mL via INTRAVENOUS
  Filled 2013-03-29: qty 10

## 2013-03-29 MED ORDER — FENTANYL 100 MCG/HR TD PT72: 100.0000 ug | MEDICATED_PATCH | TRANSDERMAL | Status: DC

## 2013-03-29 MED ORDER — RIVAROXABAN 20 MG PO TABS: 20.0000 mg | ORAL_TABLET | Freq: Every evening | ORAL | Status: DC

## 2013-03-29 NOTE — Progress Notes (Signed)
[facility]  Telephone:([phone] Fax:([phone]) [phone]  OFFICE PROGRESS NOTE   ID: [name]   DOB: Jul 08, 1950  MR#: [id number]  JUD#:[id number]   PCP: [name], MD GYN ONC:  Emmaline Kluver, MD   DIAGNOSIS: [name] is a 63 y.o. female with metastatic cervical carcinoma.   PRIOR THERAPY: #1 [name] in March 2005 for a stage IB cervical cancer. Her final pathology showed no residual disease and all lymph nodes were negative.  #2 She was followed for 5 years with no evidence of recurrent disease.  She had a recurrence in the right pelvic sidewall in February 2010 which caused an obstruction of the right ureter. s/p radiation therapy and concurrent Cisplatinum chemotherapy.  #3 In late 2011, she was found to have recurrence of the right inguinal lymph node and left pelvic nodes.  She received additional radiation therapy to those areas. Radiation was completed in January 2012.   In April 2012 the patient had resolution of the right inguinal nodes.   #4 In August 2012 the patient was found to have a liver lesion which measured 2.8 cm.  She received 6 cycles of Taxol with a partial response to chemotherapy with a liver lesion decreasing in size to 1.6 cm in 12/2010 PET scan.  PET scan also revealed that there were no other lesions except the liver.  Patient elected to ablate the liver lesion using RFA he performed on 05/07/2011.  The procedure went well but patient developed an extensive DVT in the left lower extremity.  She started receiving anticoagulant therapy with Lovenox and an IVC filter was placed.  #5 s/p radiation therapy in June, August and September 2013.   #6 In November 2013, she was found to have metastasis in left inguinal lymph nodes. S/p low-dose chemotherapy consisting of Carboplatinum and Taxol in December and January 2014.  Chemotherapy  course was complicated by neutropenia.  CT scans in February 2014  Showed an increase in size of the liver metastasis.   #7  She was started on Alimta in May 2014.  She had a difficult time with the first cycle of chemotherapy and did not receive it again until 10/26/2012.   #8  Most recent restaging scans on 01/16/2013 showed in the CT of the abdomen and pelvis no evidence for thoracic metastasis.  There are several liver lesions which are decreased in size from the previous exam. Other lesions are stable. Slight increase in size of retrocaval lymph node within the upper abdomen. Slight increase in abnormal, asymmetric soft tissue within the left external iliac region, suspicious for local metastasis.  In the CT of the chest No axillary or supraclavicular adenopathy. Review of the visualized osseous structures is significant for mild multi level thoracic spondylosis. No aggressive lytic or sclerotic bone lesions  identified.  #9 atrial fibrillation: Patient is on metoprolol and diltiazem  #10 bone health: She is receiving prolia every 6 months. Last dose administered November 2014   CURRENT THERAPY:    INTERVAL HISTORY: [name] is a 63 y.o. 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  She is continuing to experience fatigue. She tells me her appetite is poor. For the last few days she has not been able to get out of bed. She also has what sounds like nasal congestion runny eyes. She is certainly could be coming down when upper respiratory  tract infection. She has no cough no hemoptysis hematemesis. She has had some low-grade fevers off-and-on. She had an ultrasound of the right upper quadrant performed which showed some biliary dilatation. I have recommended getting some CTs of the abdomen and pelvis. Her last CTs performed were in November 2014. Patient is also in atrial fibrillation is however more severe and she is  symptomatic and it may be contributing to her fatigue.  MEDICAL HISTORY: Past Medical History  Diagnosis Date  . Diastolic dysfunction   . MIGRAINE HEADACHE   . Atrial fibrillation     failed DCCV, CHADs2-prev pradaxa stopped due to hematuria with ureter issues/JJ   . Anemia   . HTN (hypertension)   . GLAUCOMA   . CARPAL TUNNEL SYNDROME, RIGHT   . Lymphedema     L>R leg from pelvic XRT/scarring  . Hepatic steatosis     CT scan 06/2009, 12/2009  . DVT (deep venous thrombosis) 10/2010    LLE, anticoag resumed  . CHF (congestive heart failure)     1 yr ago per pt  . Arthritis   . GERD (gastroesophageal reflux disease)   . Small kidney     left  . Radiation 10/11/11-11/19/11    5040 cGy in 28 fx's/periaortic  . Radiation 03/16/10-03/25/10    right inguinal node/left external iliac node  . Radiation 06/11/08-07/23/08    6000 cGy left pelvis  . Hyperlipidemia   . Leg swelling     left  . Cervical cancer dx 04/2003, recur 04/2008    s/p debulk (TAHBSO), chemo/XRT; recurrent dz L pelvis dx 2010 and liver met 09/2010 s/p RFA  . Osteoporosis with fracture 2014    compression fx, s/p KP ; started prolia 06/2012- narcotic pain mgmt    ALLERGIES:   Allergies  Allergen Reactions  . Codeine Rash and Other (See Comments)    Breathing issues  . Flecainide Acetate Other (See Comments)    Doesn't remember reaction  . Morphine Other (See Comments)     Hallucinations  . Propafenone Hcl Other (See Comments)    Leg cramps    MEDICATIONS:  Current Outpatient Prescriptions  Medication Sig Dispense Refill  . bisacodyl (DULCOLAX) 5 MG EC tablet Take 5 mg by mouth daily as needed for constipation.      . cetirizine (ZYRTEC) 10 MG tablet Take 10 mg by mouth every evening.       . citalopram (CELEXA) 20 MG tablet Take 20 mg by mouth every evening.       . diltiazem (CARDIZEM CD) 180 MG 24 hr capsule Take 1 capsule (180 mg total) by mouth daily. TAke 2-3 hours before of after Metoprolol  30 capsule   0  . docusate sodium (COLACE) 100 MG capsule Take 1 capsule (100 mg total) by mouth 2 (two) times daily.  60 capsule  1  . fentaNYL (DURAGESIC - DOSED MCG/HR) 100 MCG/HR Place 1 patch (100 mcg total) onto the skin every 3 (three) days. Total dose $RemoveBe'125mg'sGbZCMLaQ$   10 patch  0  . fentaNYL (DURAGESIC - DOSED MCG/HR) 50 MCG/HR Place 1 patch (50 mcg total) onto the skin every 3 (three) days.  10 patch  0  . ferrous fumarate (HEMOCYTE - 106 MG FE) 325 (106 FE) MG TABS Take 1 tablet (106 mg of iron total) by mouth daily.  30 each  0  . folic acid (FOLVITE) 1 MG tablet Take 1 tablet (1 mg total) by mouth every morning.  30 tablet  6  .  HYDROmorphone (DILAUDID) 4 MG tablet Take 1 tablet every 4 hours as needed for pain  120 tablet  0  . ibuprofen (ADVIL,MOTRIN) 200 MG tablet Take 600 mg by mouth 3 (three) times daily as needed for pain. Pain      . lidocaine-prilocaine (EMLA) cream Apply 1 application topically daily as needed (for port access).  30 g  6  . LORazepam (ATIVAN) 1 MG tablet Take 1 tablet (1 mg total) by mouth every 6 (six) hours as needed (for nausea (may put under tongue)).  30 tablet  2  . metoprolol tartrate (LOPRESSOR) 25 MG tablet Take 3 tablets (75 mg total) by mouth 2 (two) times daily.  180 tablet  2  . Nutritional Supplements (CARNATION INSTANT BREAKFAST PO) Take 1 Can by mouth 4 (four) times daily as needed (for meals).      . ondansetron (ZOFRAN) 8 MG tablet Take 1 tablet (8 mg total) by mouth every 8 (eight) hours as needed for nausea.  30 tablet  2  . pantoprazole (PROTONIX) 40 MG tablet Take 1 tablet (40 mg total) by mouth daily.  30 tablet  2  . polyethylene glycol (MIRALAX / GLYCOLAX) packet Take 17 g by mouth 2 (two) times daily as needed (constipation).       [name] PRESCRIPTION MEDICATION Inject into the vein every 21 ( twenty-one) days. PEMEtrexed (ALIMTA) 950 mg in sodium chloride 0.9 % 100 mL chemo infusion 500 mg/m2  1.91 m2 (Treatment Plan Actual) md Humphrey Rolls      . protein supplement  (UNJURY CHICKEN SOUP) POWD Take 2 oz by mouth 4 (four) times daily as needed (for meals).      . Rivaroxaban (XARELTO) 20 MG TABS Take 1 tablet (20 mg total) by mouth every evening.  30 tablet  5  . sodium chloride (OCEAN) 0.65 % nasal spray Place 1 spray into the nose daily as needed for congestion. Allergies      . triamcinolone (NASACORT) 55 MCG/ACT nasal inhaler Place 2 sprays into the nose daily as needed (allergies or congestion).       [name] zolpidem (AMBIEN) 5 MG tablet Take 1 tablet (5 mg total) by mouth at bedtime as needed for sleep.  20 tablet  0   No current facility-administered medications for this visit.    SURGICAL HISTORY:  Past Surgical History  Procedure Laterality Date  . Ureteral stent placement  2005, 01/2010, 07/2010    L hydro related to cervical ca and XRT  . Oophorectomy  2005  . Cardioversion    . Tonsillectomy    . Cholecystectomy  1984  . Total abdominal hysterectomy  2005  . Carpal tunnel  2009    right  . Ivc filter  04/2011    REVIEW OF SYSTEMS:  A 10 point review of systems was conducted and is negative except as stated above.     PHYSICAL EXAMINATION: Blood pressure 114/74, pulse 80, temperature 97.7 F (36.5 C), temperature source Oral, resp. rate 18, height $RemoveBe'5\' 1"'NEFJmQeVK$  (1.549 m), weight 166 lb (75.297 kg). Body mass index is 31.38 kg/(m^2). GENERAL: Patient is a well appearing female in no acute distress HEENT:  Sclerae anicteric.  Oropharynx clear and moist. No ulcerations or evidence of oropharyngeal candidiasis. Neck is supple.  NODES:  No cervical, supraclavicular, or axillary lymphadenopathy palpated.  BREAST EXAM:  Deferred. LUNGS:  Clear to auscultation bilaterally.  No wheezes or rhonchi. HEART: Irregular rate and rhythm. No murmur appreciated. ABDOMEN:  Soft, nontender.  Positive,  normoactive bowel sounds. No organomegaly palpated. MSK:  No focal spinal tenderness to palpation. Full range of motion bilaterally in the upper  extremities. EXTREMITIES:  No peripheral edema.   SKIN:  Clear with no obvious rashes or skin changes. No nail dyscrasia. NEURO:  Nonfocal. Well oriented.  Appropriate affect. ECOG PERFORMANCE STATUS: 1 - Symptomatic but completely ambulatory  LABORATORY DATA: Lab Results  Component Value Date   WBC 5.6 03/29/2013   HGB 9.8* 03/29/2013   HCT 30.2* 03/29/2013   MCV 97.4 03/29/2013   PLT 312 03/29/2013      Chemistry      Component Value Date/Time   NA 134* 03/15/2013 1015   NA 135 12/18/2012 0620   K 4.0 03/15/2013 1015   K 4.0 12/18/2012 0620   CL 101 12/18/2012 0620   CL 108* 08/15/2012 1252   CO2 26 03/15/2013 1015   CO2 24 12/18/2012 0620   BUN 15.4 03/15/2013 1015   BUN 5* 12/18/2012 0620   CREATININE 0.8 03/15/2013 1015   CREATININE 0.76 12/18/2012 0620   CREATININE 1.45* 09/11/2010 1603      Component Value Date/Time   CALCIUM 9.1 03/15/2013 1015   CALCIUM 9.6 12/18/2012 0620   ALKPHOS 681* 03/15/2013 1015   ALKPHOS 338* 12/18/2012 0620   AST 31 03/15/2013 1015   AST 20 12/18/2012 0620   ALT 47 03/15/2013 1015   ALT 22 12/18/2012 0620   BILITOT 1.32* 03/15/2013 1015   BILITOT 0.5 12/18/2012 4696       RADIOGRAPHIC STUDIES: Ct Chest W Contrast 01/16/2013   CLINICAL DATA:  Metastatic cervical cancer  EXAM: CT CHEST, ABDOMEN, AND PELVIS WITH CONTRAST  TECHNIQUE: Multidetector CT imaging of the chest, abdomen and pelvis was performed following the standard protocol during bolus administration of intravenous contrast.  CONTRAST:  18mL OMNIPAQUE IOHEXOL 300 MG/ML  SOLN  COMPARISON:  10/13/2012  FINDINGS: CT CHEST FINDINGS  There is no pleural effusion identified. No airspace consolidation identified. Scattered areas of scarring versus platelike atelectasis identified in both upper lobes. No suspicious nodule or mass noted.  The trachea appears patent and is midline. No enlarged mediastinal or hilar lymph nodes identified. Mild cardiac enlargement. No pericardial effusion.  Calcifications involving the thoracic aorta noted.  No axillary or supraclavicular adenopathy. Review of the visualized osseous structures is significant for mild multi level thoracic spondylosis. No aggressive lytic or sclerotic bone lesions identified.  CT ABDOMEN AND PELVIS FINDINGS  Multiple liver metastases are again identified. The index lesion within the dome of liver measures 3.2 cm, image 41/ series 2. Previously 3.4 cm. Central right hepatic lobe lesion measures 3.4 cm, image 45/series 2. This is compared with 4.3 cm previously. Posterior right hepatic lobe lesion measures 4.1 cm, image 53/ series 2. Previously 3.9 cm. Anterior right hepatic lobe lesion measures 6.9 cm, image 52/ series 2. Stable from previous exam. Slight increase an intrahepatic biliary dilatation. Prior cholecystectomy. The common bile duct is normal in caliber. Normal appearance of the pancreas. The spleen is unremarkable. Normal appearance of both adrenal glands. The right kidney is within normal limits. In stage left kidney is identified. Urinary bladder appears within normal limits. Previous hysterectomy.  Calcified atherosclerotic disease affects the abdominal aorta. Index portacaval lymph node is stable measuring 8 mm. Retrocaval lymph node measures 1 cm and is increased in size from previous exam, image 55/ series 2. Previously this measured 5 mm. Central low attenuation, peripherally enhancing lymph node within the left external iliac lymph node chain  is stable measuring 2 cm. There is an area of increase soft tissue attenuation measuring 2.5 cm, image number 98/series 2.  The stomach appears normal in appearance. The small bowel loops have a normal course and caliber. No obstruction. Normal appearance of the colon.  Review of the visualized osseous structures is significant for compression fractures involving the L1 L2-L3 and L4 vertebra. The L1 and L3 vertebra have been treated with bone cement.  IMPRESSION: 1. No evidence for  thoracic metastasis. 2. There are several liver lesions which are decreased in size from the previous exam. Other lesions are stable. 3. Slight increase in size of retrocaval lymph node within the upper abdomen. 4. Slight increase in abnormal, asymmetric soft tissue within the left external iliac region, suspicious for local metastasis.   Electronically Signed   By: Kerby Moors M.D.   On: 01/16/2013 09:06      ASSESSMENT/PLAN: [name] is a  63 y.o. woman with:  #1 Metastatic cervical carcinoma with liver metastasis. Patient is currently receiving alimta dose reduced. She was here for cycle #9 today. However we will not administer it today since she is having significant fatigue from this.  #3 Depression and anxiety - On Celexa.  #4 Poor appetite - using Carnation Instant Breakfast and protein supplement (Unjury chicken soup)  #5 DVT - On Xarelto  #6 Chronic left lower extremity lymphedema   #7  Right lower quadrant pain - Refill for fentanyl 150 mcg patches were given today. She will continue 150 mg daily and continue Tylox 4 mg every 4 hours as needed. She also finds that her pain is improved when she takes ibuprofen. I have gone ahead and recommended that she can take ibuprofen up to 3 pills a day (200 mg).  #8 Constipation -  bowel regimen including increased hydration with water, consumption of fruits/vegetables/fiber, and taking Colace by mouth twice a day was recommended.  #9 atrial fibrillation: Patient is more symptomatic from it she feels that her heart is always racing. She is followed by cardiology in she will be calling them for a followup. I suspect this may also be contributing to her fatigue.  #10 patient will return on 04/06/2013 for followup as well as chemotherapy.    All questions answered. Mrs. Conway was encouraged to contact us in the interim with any questions, concerns, or problems.  I spent 30 minutes counseling the patient face to face.  The total time  spent in the appointment was 40 minutes.   Marcy Panning, MD Medical/Oncology [facility] [phone] (beeper) [phone] (Office)  03/29/2013, 12:10 PM

## 2013-03-29 NOTE — Telephone Encounter (Signed)
Per staff message and POF I have scheduled appts.  JMW  

## 2013-03-29 NOTE — Patient Instructions (Signed)

## 2013-03-29 NOTE — Telephone Encounter (Signed)
Last written 12/22/12 with 2 refills

## 2013-03-29 NOTE — Telephone Encounter (Signed)
Pt states pharmacy states they have twice to get refill on her xalreto. Inform pt we haven;t received paper or electronic request from her pharmacy. Inform her will send to [facility] drug...Johny Chess

## 2013-03-30 ENCOUNTER — Ambulatory Visit: Payer: 59

## 2013-03-30 MED ORDER — METOPROLOL TARTRATE 25 MG PO TABS: 75.0000 mg | ORAL_TABLET | Freq: Two times a day (BID) | ORAL | Status: DC

## 2013-04-05 ENCOUNTER — Ambulatory Visit (HOSPITAL_BASED_OUTPATIENT_CLINIC_OR_DEPARTMENT_OTHER): Payer: 59

## 2013-04-05 ENCOUNTER — Ambulatory Visit (HOSPITAL_BASED_OUTPATIENT_CLINIC_OR_DEPARTMENT_OTHER): Payer: 59 | Admitting: Hematology and Oncology

## 2013-04-05 ENCOUNTER — Telehealth: Payer: Self-pay | Admitting: Oncology

## 2013-04-05 ENCOUNTER — Encounter (HOSPITAL_COMMUNITY): Payer: Self-pay

## 2013-04-05 ENCOUNTER — Other Ambulatory Visit: Payer: Self-pay | Admitting: *Deleted

## 2013-04-05 ENCOUNTER — Ambulatory Visit (HOSPITAL_COMMUNITY)
Admission: RE | Admit: 2013-04-05 | Discharge: 2013-04-05 | Disposition: A | Discharge status: Home / Self Care | Payer: 59 | Source: Ambulatory Visit | Attending: Oncology | Admitting: Oncology

## 2013-04-05 VITALS — BP 110/74 | HR 83 | Temp 98.0°F | Resp 18 | Ht 61.0 in | Wt 161.8 lb

## 2013-04-05 DIAGNOSIS — I4891 Unspecified atrial fibrillation: Secondary | ICD-10-CM

## 2013-04-05 DIAGNOSIS — I77810 Thoracic aortic ectasia: Secondary | ICD-10-CM | POA: Diagnosis not present

## 2013-04-05 DIAGNOSIS — C539 Malignant neoplasm of cervix uteri, unspecified: Secondary | ICD-10-CM

## 2013-04-05 DIAGNOSIS — R110 Nausea: Secondary | ICD-10-CM | POA: Diagnosis not present

## 2013-04-05 DIAGNOSIS — R630 Anorexia: Secondary | ICD-10-CM

## 2013-04-05 DIAGNOSIS — J984 Other disorders of lung: Secondary | ICD-10-CM | POA: Insufficient documentation

## 2013-04-05 DIAGNOSIS — R599 Enlarged lymph nodes, unspecified: Secondary | ICD-10-CM | POA: Diagnosis not present

## 2013-04-05 DIAGNOSIS — K5900 Constipation, unspecified: Secondary | ICD-10-CM | POA: Insufficient documentation

## 2013-04-05 DIAGNOSIS — M439 Deforming dorsopathy, unspecified: Secondary | ICD-10-CM | POA: Diagnosis not present

## 2013-04-05 DIAGNOSIS — I700 Atherosclerosis of aorta: Secondary | ICD-10-CM | POA: Insufficient documentation

## 2013-04-05 DIAGNOSIS — Z9089 Acquired absence of other organs: Secondary | ICD-10-CM | POA: Diagnosis not present

## 2013-04-05 DIAGNOSIS — M479 Spondylosis, unspecified: Secondary | ICD-10-CM | POA: Diagnosis not present

## 2013-04-05 DIAGNOSIS — R5383 Other fatigue: Secondary | ICD-10-CM

## 2013-04-05 DIAGNOSIS — N269 Renal sclerosis, unspecified: Secondary | ICD-10-CM | POA: Diagnosis not present

## 2013-04-05 DIAGNOSIS — I82409 Acute embolism and thrombosis of unspecified deep veins of unspecified lower extremity: Secondary | ICD-10-CM

## 2013-04-05 DIAGNOSIS — C787 Secondary malignant neoplasm of liver and intrahepatic bile duct: Secondary | ICD-10-CM | POA: Insufficient documentation

## 2013-04-05 DIAGNOSIS — E860 Dehydration: Secondary | ICD-10-CM

## 2013-04-05 DIAGNOSIS — Z90710 Acquired absence of both cervix and uterus: Secondary | ICD-10-CM | POA: Diagnosis not present

## 2013-04-05 DIAGNOSIS — K838 Other specified diseases of biliary tract: Secondary | ICD-10-CM | POA: Insufficient documentation

## 2013-04-05 DIAGNOSIS — R5381 Other malaise: Secondary | ICD-10-CM

## 2013-04-05 DIAGNOSIS — I2510 Atherosclerotic heart disease of native coronary artery without angina pectoris: Secondary | ICD-10-CM | POA: Insufficient documentation

## 2013-04-05 DIAGNOSIS — N281 Cyst of kidney, acquired: Secondary | ICD-10-CM | POA: Insufficient documentation

## 2013-04-05 DIAGNOSIS — R109 Unspecified abdominal pain: Secondary | ICD-10-CM | POA: Insufficient documentation

## 2013-04-05 DIAGNOSIS — F341 Dysthymic disorder: Secondary | ICD-10-CM

## 2013-04-05 DIAGNOSIS — R1031 Right lower quadrant pain: Secondary | ICD-10-CM

## 2013-04-05 DIAGNOSIS — C774 Secondary and unspecified malignant neoplasm of inguinal and lower limb lymph nodes: Secondary | ICD-10-CM

## 2013-04-05 LAB — COMPREHENSIVE METABOLIC PANEL (CC13)
ALK PHOS: 861 U/L — AB (ref 40–150)
ALT: 70 U/L — AB (ref 0–55)
AST: 65 U/L — ABNORMAL HIGH (ref 5–34)
Albumin: 2.8 g/dL — ABNORMAL LOW (ref 3.5–5.0)
Anion Gap: 10 mEq/L (ref 3–11)
BUN: 9.4 mg/dL (ref 7.0–26.0)
CO2: 23 meq/L (ref 22–29)
Calcium: 9.2 mg/dL (ref 8.4–10.4)
Chloride: 103 mEq/L (ref 98–109)
Creatinine: 0.7 mg/dL (ref 0.6–1.1)
GLUCOSE: 97 mg/dL (ref 70–140)
POTASSIUM: 4.1 meq/L (ref 3.5–5.1)
SODIUM: 135 meq/L — AB (ref 136–145)
TOTAL PROTEIN: 7.1 g/dL (ref 6.4–8.3)
Total Bilirubin: 7.59 mg/dL — ABNORMAL HIGH (ref 0.20–1.20)

## 2013-04-05 LAB — CBC WITH DIFFERENTIAL/PLATELET
BASO%: 0.8 % (ref 0.0–2.0)
Basophils Absolute: 0.1 10*3/uL (ref 0.0–0.1)
EOS%: 0.4 % (ref 0.0–7.0)
Eosinophils Absolute: 0 10*3/uL (ref 0.0–0.5)
HCT: 32.9 % — ABNORMAL LOW (ref 34.8–46.6)
HGB: 11.2 g/dL — ABNORMAL LOW (ref 11.6–15.9)
LYMPH%: 8 % — ABNORMAL LOW (ref 14.0–49.7)
MCH: 33.3 pg (ref 25.1–34.0)
MCHC: 33.9 g/dL (ref 31.5–36.0)
MCV: 98.3 fL (ref 79.5–101.0)
MONO#: 1.1 10*3/uL — AB (ref 0.1–0.9)
MONO%: 13.8 % (ref 0.0–14.0)
NEUT%: 77 % — ABNORMAL HIGH (ref 38.4–76.8)
NEUTROS ABS: 5.9 10*3/uL (ref 1.5–6.5)
Platelets: 310 10*3/uL (ref 145–400)
RBC: 3.35 10*6/uL — ABNORMAL LOW (ref 3.70–5.45)
RDW: 16.9 % — AB (ref 11.2–14.5)
WBC: 7.6 10*3/uL (ref 3.9–10.3)
lymph#: 0.6 10*3/uL — ABNORMAL LOW (ref 0.9–3.3)

## 2013-04-05 LAB — BILIRUBIN, DIRECT: Bilirubin, Direct: 5.8 mg/dL — ABNORMAL HIGH (ref 0.0–0.3)

## 2013-04-05 MED ORDER — SODIUM CHLORIDE 0.45 % IV SOLN
INTRAVENOUS | Status: DC
  Administered 2013-04-05: 13:00:00 via INTRAVENOUS
  Filled 2013-04-05: qty 1000

## 2013-04-05 MED ORDER — SODIUM CHLORIDE 0.9 % IJ SOLN
10.0000 mL | INTRAMUSCULAR | Status: DC | PRN
  Administered 2013-04-05: 10 mL via INTRAVENOUS
  Filled 2013-04-05: qty 10

## 2013-04-05 MED ORDER — IOHEXOL 300 MG/ML  SOLN
100.0000 mL | Freq: Once | INTRAMUSCULAR | Status: AC | PRN
  Administered 2013-04-05: 100 mL via INTRAVENOUS

## 2013-04-05 MED ORDER — HEPARIN SOD (PORK) LOCK FLUSH 100 UNIT/ML IV SOLN
500.0000 [IU] | Freq: Once | INTRAVENOUS | Status: AC
  Administered 2013-04-05: 500 [IU] via INTRAVENOUS
  Filled 2013-04-05: qty 5

## 2013-04-05 NOTE — Progress Notes (Signed)
Pt express concerns that she has become jaundice over the last few days. Dr Earnest Conroy made aware of pt concerns.

## 2013-04-05 NOTE — Telephone Encounter (Signed)
, °

## 2013-04-05 NOTE — Progress Notes (Signed)
[facility]  [phone] [phone]  OFFICE PROGRESS NOTE   ID: SARAJANE FAMBROUGH   DOB: 10/01/1950  MR#: [id number]  ERX#:[id number]   PCP: Gwendolyn Grant, MD GYN ONC:  Threasa Heads, MD   DIAGNOSIS: [name] is a 63 y.o. female with metastatic cervical carcinoma.   CURRENT THERAPY: Pemetrexed C#9  Planned  for tomorrow  CHIEF COMPLAINT: Jaundice and feeling weak and tired  PRIOR THERAPY: #1 [name] in March 2005 for a stage IB cervical cancer. Her final pathology showed no residual disease and all lymph nodes were negative.  #2 She was followed for 5 years with no evidence of recurrent disease.  She had a recurrence in the right pelvic sidewall in February 2010 which caused an obstruction of the right ureter. s/p radiation therapy and concurrent Cisplatinum chemotherapy.  #3 In late 2011, she was found to have recurrence of the right inguinal lymph node and left pelvic nodes.  She received additional radiation therapy to those areas. Radiation was completed in January 2012.   In April 2012 the patient had resolution of the right inguinal nodes.   #4 In August 2012 the patient was found to have a liver lesion which measured 2.8 cm.  She received 6 cycles of Taxol with a partial response to chemotherapy with a liver lesion decreasing in size to 1.6 cm in 12/2010 PET scan.  PET scan also revealed that there were no other lesions except the liver.  Patient elected to ablate the liver lesion using RFA he performed on 05/07/2011.  The procedure went well but patient developed an extensive DVT in the left lower extremity.  She started receiving anticoagulant therapy with Lovenox and an IVC filter was placed.  #5 s/p radiation therapy in June, August and September 2013.   #6 In November 2013, she was found to have metastasis in left inguinal lymph  nodes. S/p low-dose chemotherapy consisting of Carboplatinum and Taxol in December and January 2014.  Chemotherapy course was complicated by neutropenia.  CT scans in February 2014  Showed an increase in size of the liver metastasis.   #7  She was started on Alimta in May 2014.  She had a difficult time with the first cycle of chemotherapy and did not receive it again until 10/26/2012.   #8  Most recent restaging scans on 01/16/2013 showed in the CT of the abdomen and pelvis no evidence for thoracic metastasis.  There are several liver lesions which are decreased in size from the previous exam. Other lesions are stable. Slight increase in size of retrocaval lymph node within the upper abdomen. Slight increase in abnormal, asymmetric soft tissue within the left external iliac region, suspicious for local metastasis.  In the CT of the chest No axillary or supraclavicular adenopathy. Review of the visualized osseous structures is significant for mild multi level thoracic spondylosis. No aggressive lytic or sclerotic bone lesions  identified.  #9 atrial fibrillation: Patient is on metoprolol and diltiazem  #10 bone health: She is receiving prolia every 6 months. Last dose administered November 2014     INTERVAL HISTORY: [name] is a 63 y.o. 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 She complains of jaundice which is worsening for the past few days. She also noticed  her stool is a very pale and urine is dark yellow.  She she complains of weak and  tired , decrease in appetite. she is not  able to  drink enough fluids. Her right upper quadrant pain is controlled with current pain management.  She has no cough no hemoptysis hematemesis. She had CTs of the abdomen and pelvis/chest was performed this morning. She does not complain of any fever shortness of breath, chest pains or palpitations at this time. She says her runny nose and the upper  respiratory tract symptoms are much improved     MEDICAL HISTORY: Past Medical History  Diagnosis Date  . Diastolic dysfunction   . MIGRAINE HEADACHE   . Atrial fibrillation     failed DCCV, CHADs2-prev pradaxa stopped due to hematuria with ureter issues/JJ   . Anemia   . HTN (hypertension)   . GLAUCOMA   . CARPAL TUNNEL SYNDROME, RIGHT   . Lymphedema     L>R leg from pelvic XRT/scarring  . Hepatic steatosis     CT scan 06/2009, 12/2009  . DVT (deep venous thrombosis) 10/2010    LLE, anticoag resumed  . CHF (congestive heart failure)     1 yr ago per pt  . Arthritis   . GERD (gastroesophageal reflux disease)   . Small kidney     left  . Radiation 10/11/11-11/19/11    5040 cGy in 28 fx's/periaortic  . Radiation 03/16/10-03/25/10    right inguinal node/left external iliac node  . Radiation 06/11/08-07/23/08    6000 cGy left pelvis  . Hyperlipidemia   . Leg swelling     left  . Cervical cancer dx 04/2003, recur 04/2008    s/p debulk (TAHBSO), chemo/XRT; recurrent dz L pelvis dx 2010 and liver met 09/2010 s/p RFA  . Osteoporosis with fracture 2014    compression fx, s/p KP ; started prolia 06/2012- narcotic pain mgmt    ALLERGIES:   Allergies  Allergen Reactions  . Codeine Rash and Other (See Comments)    Breathing issues  . Flecainide Acetate Other (See Comments)    Doesn't remember reaction  . Morphine Other (See Comments)     Hallucinations  . Propafenone Hcl Other (See Comments)    Leg cramps    MEDICATIONS:  Current Outpatient Prescriptions  Medication Sig Dispense Refill  . bisacodyl (DULCOLAX) 5 MG EC tablet Take 5 mg by mouth daily as needed for constipation.      . cetirizine (ZYRTEC) 10 MG tablet Take 10 mg by mouth every evening.       . citalopram (CELEXA) 20 MG tablet Take 20 mg by mouth every evening.       . diltiazem (CARDIZEM CD) 180 MG 24 hr capsule Take 1 capsule (180 mg total) by mouth daily. TAke 2-3 hours before of after Metoprolol  30 capsule  0  .  docusate sodium (COLACE) 100 MG capsule Take 1 capsule (100 mg total) by mouth 2 (two) times daily.  60 capsule  1  . fentaNYL (DURAGESIC - DOSED MCG/HR) 100 MCG/HR Place 1 patch (100 mcg total) onto the skin every 3 (three) days. Total dose $RemoveBe'125mg'zOXQuQVqM$   10 patch  0  . fentaNYL (DURAGESIC - DOSED MCG/HR) 50 MCG/HR Place 1 patch (50 mcg total) onto the skin every 3 (three) days.  10 patch  0  . ferrous fumarate (HEMOCYTE - 106 MG FE) 325 (106 FE) MG TABS Take 1 tablet (106 mg of iron total) by mouth daily.  30 each  0  . folic acid (FOLVITE) 1 MG tablet Take 1 tablet (1 mg total) by mouth  every morning.  30 tablet  6  . HYDROmorphone (DILAUDID) 4 MG tablet Take 1 tablet every 4 hours as needed for pain  120 tablet  0  . ibuprofen (ADVIL,MOTRIN) 200 MG tablet Take 600 mg by mouth 3 (three) times daily as needed for pain. Pain      . lidocaine-prilocaine (EMLA) cream Apply 1 application topically daily as needed (for port access).  30 g  6  . LORazepam (ATIVAN) 1 MG tablet Take 1 tablet (1 mg total) by mouth every 6 (six) hours as needed (for nausea (may put under tongue)).  30 tablet  2  . metoprolol tartrate (LOPRESSOR) 25 MG tablet Take 3 tablets (75 mg total) by mouth 2 (two) times daily.  180 tablet  2  . Nutritional Supplements (CARNATION INSTANT BREAKFAST PO) Take 1 Can by mouth 4 (four) times daily as needed (for meals).      . ondansetron (ZOFRAN) 8 MG tablet Take 1 tablet (8 mg total) by mouth every 8 (eight) hours as needed for nausea.  30 tablet  2  . pantoprazole (PROTONIX) 40 MG tablet Take 1 tablet (40 mg total) by mouth daily.  30 tablet  2  . polyethylene glycol (MIRALAX / GLYCOLAX) packet Take 17 g by mouth 2 (two) times daily as needed (constipation).       [name] PRESCRIPTION MEDICATION Inject into the vein every 21 ( twenty-one) days. PEMEtrexed (ALIMTA) 950 mg in sodium chloride 0.9 % 100 mL chemo infusion 500 mg/m2  1.91 m2 (Treatment Plan Actual) md Humphrey Rolls      . protein supplement (UNJURY  CHICKEN SOUP) POWD Take 2 oz by mouth 4 (four) times daily as needed (for meals).      . Rivaroxaban (XARELTO) 20 MG TABS tablet Take 1 tablet (20 mg total) by mouth every evening.  30 tablet  5  . sodium chloride (OCEAN) 0.65 % nasal spray Place 1 spray into the nose daily as needed for congestion. Allergies      . triamcinolone (NASACORT) 55 MCG/ACT nasal inhaler Place 2 sprays into the nose daily as needed (allergies or congestion).       [name] zolpidem (AMBIEN) 5 MG tablet Take 1 tablet (5 mg total) by mouth at bedtime as needed for sleep.  20 tablet  0   Current Facility-Administered Medications  Medication Dose Route Frequency Provider Last Rate Last Dose  . 0.45 % sodium chloride infusion   Intravenous Continuous Wilmon Arms, MD       Facility-Administered Medications Ordered in Other Visits  Medication Dose Route Frequency Provider Last Rate Last Dose  . sodium chloride 0.9 % injection 10 mL  10 mL Intravenous PRN Deatra Robinson, MD   10 mL at 04/05/13 1356    SURGICAL HISTORY:  Past Surgical History  Procedure Laterality Date  . Ureteral stent placement  2005, 01/2010, 07/2010    L hydro related to cervical ca and XRT  . Oophorectomy  2005  . Cardioversion    . Tonsillectomy    . Cholecystectomy  1984  . Total abdominal hysterectomy  2005  . Carpal tunnel  2009    right  . Ivc filter  04/2011    REVIEW OF SYSTEMS:  A 10 point review of systems was conducted and pertinent symptoms as stated above.     PHYSICAL EXAMINATION: Blood pressure 110/74, pulse 83, temperature 98 F (36.7 C), temperature source Oral, resp. rate 18, height $RemoveBe'5\' 1"'tahHiPirC$  (1.549 m), weight 161 lb 12.8 oz (  73.392 kg). Body mass index is 30.59 kg/(m^2). GENERAL: Patient is a well appearing female in no acute distress HEENT:  Sclerae- icteric   Oropharynx clear and moist. No ulcerations or evidence of oropharyngeal candidiasis. Neck is supple.  NODES:  No cervical, supraclavicular, or axillary lymphadenopathy  palpated.  BREAST EXAM:  Deferred. LUNGS:  Clear to auscultation bilaterally.  No wheezes or rhonchi. HEART: Irregular rate and rhythm. No murmur appreciated. ABDOMEN:  Soft, nontender.  Positive, normoactive bowel sounds. No organomegaly palpated. MSK:  No focal spinal tenderness to palpation. Full range of motion bilaterally in the upper extremities. EXTREMITIES:  No peripheral edema.   SKIN:  Clear with no obvious rashes or skin changes. No nail dyscrasia. NEURO:  Nonfocal. Well oriented.  Appropriate affect. ECOG PERFORMANCE STATUS: 1 - Symptomatic but completely ambulatory  LABORATORY DATA: Lab Results  Component Value Date   WBC 7.6 04/05/2013   HGB 11.2* 04/05/2013   HCT 32.9* 04/05/2013   MCV 98.3 04/05/2013   PLT 310 04/05/2013      Chemistry      Component Value Date/Time   NA 135* 04/05/2013 0935   NA 135 12/18/2012 0620   K 4.1 04/05/2013 0935   K 4.0 12/18/2012 0620   CL 101 12/18/2012 0620   CL 108* 08/15/2012 1252   CO2 23 04/05/2013 0935   CO2 24 12/18/2012 0620   BUN 9.4 04/05/2013 0935   BUN 5* 12/18/2012 0620   CREATININE 0.7 04/05/2013 0935   CREATININE 0.76 12/18/2012 0620   CREATININE 1.45* 09/11/2010 1603      Component Value Date/Time   CALCIUM 9.2 04/05/2013 0935   CALCIUM 9.6 12/18/2012 0620   ALKPHOS 861* 04/05/2013 0935   ALKPHOS 338* 12/18/2012 0620   AST 65* 04/05/2013 0935   AST 20 12/18/2012 0620   ALT 70* 04/05/2013 0935   ALT 22 12/18/2012 0620   BILITOT 7.59* 04/05/2013 0935   BILITOT 0.5 12/18/2012 0620       RADIOGRAPHIC STUDIES: Ct Chest W Contrast 01/16/2013   CLINICAL DATA:  Metastatic cervical cancer  EXAM: CT CHEST, ABDOMEN, AND PELVIS WITH CONTRAST  TECHNIQUE: Multidetector CT imaging of the chest, abdomen and pelvis was performed following the standard protocol during bolus administration of intravenous contrast.  CONTRAST:  127mL OMNIPAQUE IOHEXOL 300 MG/ML  SOLN  COMPARISON:  10/13/2012  FINDINGS: CT CHEST FINDINGS  There is no pleural effusion  identified. No airspace consolidation identified. Scattered areas of scarring versus platelike atelectasis identified in both upper lobes. No suspicious nodule or mass noted.  The trachea appears patent and is midline. No enlarged mediastinal or hilar lymph nodes identified. Mild cardiac enlargement. No pericardial effusion. Calcifications involving the thoracic aorta noted.  No axillary or supraclavicular adenopathy. Review of the visualized osseous structures is significant for mild multi level thoracic spondylosis. No aggressive lytic or sclerotic bone lesions identified.  CT ABDOMEN AND PELVIS FINDINGS  Multiple liver metastases are again identified. The index lesion within the dome of liver measures 3.2 cm, image 41/ series 2. Previously 3.4 cm. Central right hepatic lobe lesion measures 3.4 cm, image 45/series 2. This is compared with 4.3 cm previously. Posterior right hepatic lobe lesion measures 4.1 cm, image 53/ series 2. Previously 3.9 cm. Anterior right hepatic lobe lesion measures 6.9 cm, image 52/ series 2. Stable from previous exam. Slight increase an intrahepatic biliary dilatation. Prior cholecystectomy. The common bile duct is normal in caliber. Normal appearance of the pancreas. The spleen is unremarkable. Normal appearance of  both adrenal glands. The right kidney is within normal limits. In stage left kidney is identified. Urinary bladder appears within normal limits. Previous hysterectomy.  Calcified atherosclerotic disease affects the abdominal aorta. Index portacaval lymph node is stable measuring 8 mm. Retrocaval lymph node measures 1 cm and is increased in size from previous exam, image 55/ series 2. Previously this measured 5 mm. Central low attenuation, peripherally enhancing lymph node within the left external iliac lymph node chain is stable measuring 2 cm. There is an area of increase soft tissue attenuation measuring 2.5 cm, image number 98/series 2.  The stomach appears normal in  appearance. The small bowel loops have a normal course and caliber. No obstruction. Normal appearance of the colon.  Review of the visualized osseous structures is significant for compression fractures involving the L1 L2-L3 and L4 vertebra. The L1 and L3 vertebra have been treated with bone cement.  IMPRESSION: 1. No evidence for thoracic metastasis. 2. There are several liver lesions which are decreased in size from the previous exam. Other lesions are stable. 3. Slight increase in size of retrocaval lymph node within the upper abdomen. 4. Slight increase in abnormal, asymmetric soft tissue within the left external iliac region, suspicious for local metastasis.   Electronically Signed   By: Kerby Moors M.D.   On: 01/16/2013 09:06      ASSESSMENT/PLAN: [name] is a  63 y.o. woman with:  #1 Metastatic cervical carcinoma with liver metastasis. Patient is currently receiving alimta dose reduced.  She has completed 8 cycles of Alimta. Repeat CT of the chest abdomen and pelvis performed today revealed mixed response of multifocal hepatic metastasis with slight decrease in in the majority of lesions ., Moderate intrahepatic ductal dilatation left greater than the right. New small lesions are present in the left hepatic lobe. Retrocaval and  left external iliac lymphadenopathy is stable. there is  mild to moderate superior end plate compression deformities are noted at L2 and L4.  I have reviewed the CAT scan findings with the patient and her husband . Since patient has disease progression with  new lesions in the liver and also worsening Jaundice, we'll discontinue the current chemotherapy. We'll obtain the ultrasound of the abdomen to rule out biliary obstruction. If the ultrasound shows any evidence of biliary obstruction we'll refer the patient to IR for possible stent / percutenous drain placemrnt Will request the lab to perform fractionation of bilirubin We'll repeat CMP and fractionization of   bilirubin tomorrow and on Monday Will also decrease the fentanyl patch from 150 to 100 mcg/hr, to see if this can help in any reduction in the level of bilirubin  #2 Decrease in appetite, poor po intake: We will arrange for 1 L of half normal saline to be given today   #3 Depression and anxiety - On Celexa.  #4 Poor appetite - using Carnation Instant Breakfast and protein supplement (Unjury chicken soup)  #5 DVT - On Xarelto  #6 Chronic left lower extremity lymphedema   #7  Right lower quadrant pain -decreased fentanyl patch to 100 mcg from 150 mcg patches in view of worsening jaundice.  She will continue Dilaudid 4 mg every 4 hours as needed and ibuprofen as scheduled.  #8 Constipation -  bowel regimen including increased hydration with water, consumption of fruits/vegetables/fiber, and taking Colace by mouth twice a day was recommended.  #9 atrial fibrillation: She is followed by cardiology  #10 patient will return on 04/09/2013 for followup  All questions were answered. Mrs. Brisky was encouraged to contact us in the interim with any questions, concerns, or problems.  I spent 30 minutes counseling the patient face to face.  The total time spent in the appointment was 45 minutes.   Wilmon Arms, MD Medical/Oncology [facility]  04/05/2013, 3:02 PM

## 2013-04-05 NOTE — Patient Instructions (Signed)
Dehydration, Adult Dehydration is when you lose more fluids from the body than you take in. Vital organs like the kidneys, brain, and heart cannot function without a proper amount of fluids and salt. Any loss of fluids from the body can cause dehydration.  CAUSES   Vomiting.  Diarrhea.  Excessive sweating.  Excessive urine output.  Fever. SYMPTOMS  Mild dehydration  Thirst.  Dry lips.  Slightly dry mouth. Moderate dehydration  Very dry mouth.  Sunken eyes.  Skin does not bounce back quickly when lightly pinched and released.  Dark urine and decreased urine production.  Decreased tear production.  Headache. Severe dehydration  Very dry mouth.  Extreme thirst.  Rapid, weak pulse (more than 100 beats per minute at rest).  Cold hands and feet.  Not able to sweat in spite of heat and temperature.  Rapid breathing.  Blue lips.  Confusion and lethargy.  Difficulty being awakened.  Minimal urine production.  No tears. DIAGNOSIS  Your caregiver will diagnose dehydration based on your symptoms and your exam. Blood and urine tests will help confirm the diagnosis. The diagnostic evaluation should also identify the cause of dehydration. TREATMENT  Treatment of mild or moderate dehydration can often be done at home by increasing the amount of fluids that you drink. It is best to drink small amounts of fluid more often. Drinking too much at one time can make vomiting worse. Refer to the home care instructions below. Severe dehydration needs to be treated at the hospital where you will probably be given intravenous (IV) fluids that contain water and electrolytes. HOME CARE INSTRUCTIONS   Ask your caregiver about specific rehydration instructions.  Drink enough fluids to keep your urine clear or pale yellow.  Drink small amounts frequently if you have nausea and vomiting.  Eat as you normally do.  Avoid:  Foods or drinks high in sugar.  Carbonated  drinks.  Juice.  Extremely hot or cold fluids.  Drinks with caffeine.  Fatty, greasy foods.  Alcohol.  Tobacco.  Overeating.  Gelatin desserts.  Wash your hands well to avoid spreading bacteria and viruses.  Only take over-the-counter or prescription medicines for pain, discomfort, or fever as directed by your caregiver.  Ask your caregiver if you should continue all prescribed and over-the-counter medicines.  Keep all follow-up appointments with your caregiver. SEEK MEDICAL CARE IF:  You have abdominal pain and it increases or stays in one area (localizes).  You have a rash, stiff neck, or severe headache.  You are irritable, sleepy, or difficult to awaken.  You are weak, dizzy, or extremely thirsty. SEEK IMMEDIATE MEDICAL CARE IF:   You are unable to keep fluids down or you get worse despite treatment.  You have frequent episodes of vomiting or diarrhea.  You have blood or green matter (bile) in your vomit.  You have blood in your stool or your stool looks black and tarry.  You have not urinated in 6 to 8 hours, or you have only urinated a small amount of very dark urine.  You have a fever.  You faint. MAKE SURE YOU:   Understand these instructions.  Will watch your condition.  Will get help right away if you are not doing well or get worse. Document Released: 02/15/2005 Document Revised: 05/10/2011 Document Reviewed: 10/05/2010 [employer] Patient Information 2014 [employer].  

## 2013-04-06 ENCOUNTER — Ambulatory Visit: Payer: 59

## 2013-04-06 ENCOUNTER — Other Ambulatory Visit: Payer: Self-pay

## 2013-04-06 ENCOUNTER — Ambulatory Visit (HOSPITAL_COMMUNITY)
Admission: RE | Admit: 2013-04-06 | Discharge: 2013-04-06 | Disposition: A | Discharge status: Home / Self Care | Payer: 59 | Source: Ambulatory Visit | Attending: Hematology and Oncology | Admitting: Hematology and Oncology

## 2013-04-06 ENCOUNTER — Other Ambulatory Visit: Payer: Self-pay | Admitting: Radiology

## 2013-04-06 ENCOUNTER — Ambulatory Visit: Payer: 59 | Admitting: Oncology

## 2013-04-06 ENCOUNTER — Telehealth: Payer: Self-pay

## 2013-04-06 ENCOUNTER — Other Ambulatory Visit: Payer: 59

## 2013-04-06 ENCOUNTER — Other Ambulatory Visit (HOSPITAL_BASED_OUTPATIENT_CLINIC_OR_DEPARTMENT_OTHER): Payer: 59

## 2013-04-06 ENCOUNTER — Other Ambulatory Visit: Payer: Self-pay | Admitting: Hematology and Oncology

## 2013-04-06 ENCOUNTER — Other Ambulatory Visit (HOSPITAL_COMMUNITY): Payer: Self-pay | Admitting: Hematology and Oncology

## 2013-04-06 DIAGNOSIS — K831 Obstruction of bile duct: Secondary | ICD-10-CM

## 2013-04-06 DIAGNOSIS — C787 Secondary malignant neoplasm of liver and intrahepatic bile duct: Secondary | ICD-10-CM | POA: Diagnosis not present

## 2013-04-06 DIAGNOSIS — C539 Malignant neoplasm of cervix uteri, unspecified: Secondary | ICD-10-CM

## 2013-04-06 DIAGNOSIS — E860 Dehydration: Secondary | ICD-10-CM

## 2013-04-06 DIAGNOSIS — K838 Other specified diseases of biliary tract: Secondary | ICD-10-CM | POA: Diagnosis not present

## 2013-04-06 LAB — COMPREHENSIVE METABOLIC PANEL (CC13)
ALK PHOS: 856 U/L — AB (ref 40–150)
ALT: 64 U/L — AB (ref 0–55)
AST: 60 U/L — ABNORMAL HIGH (ref 5–34)
Albumin: 2.6 g/dL — ABNORMAL LOW (ref 3.5–5.0)
Anion Gap: 9 mEq/L (ref 3–11)
BUN: 7.2 mg/dL (ref 7.0–26.0)
CALCIUM: 8.9 mg/dL (ref 8.4–10.4)
CO2: 24 mEq/L (ref 22–29)
CREATININE: 0.7 mg/dL (ref 0.6–1.1)
Chloride: 104 mEq/L (ref 98–109)
Glucose: 132 mg/dl (ref 70–140)
Potassium: 3.8 mEq/L (ref 3.5–5.1)
Sodium: 137 mEq/L (ref 136–145)
Total Bilirubin: 7.91 mg/dL — ABNORMAL HIGH (ref 0.20–1.20)
Total Protein: 6.6 g/dL (ref 6.4–8.3)

## 2013-04-06 LAB — CBC WITH DIFFERENTIAL/PLATELET
BASO%: 0.2 % (ref 0.0–2.0)
BASOS ABS: 0 10*3/uL (ref 0.0–0.1)
EOS%: 0.9 % (ref 0.0–7.0)
Eosinophils Absolute: 0 10*3/uL (ref 0.0–0.5)
HEMATOCRIT: 33.7 % — AB (ref 34.8–46.6)
HEMOGLOBIN: 10.9 g/dL — AB (ref 11.6–15.9)
LYMPH#: 0.4 10*3/uL — AB (ref 0.9–3.3)
LYMPH%: 9.5 % — AB (ref 14.0–49.7)
MCH: 31.7 pg (ref 25.1–34.0)
MCHC: 32.3 g/dL (ref 31.5–36.0)
MCV: 98 fL (ref 79.5–101.0)
MONO#: 0.7 10*3/uL (ref 0.1–0.9)
MONO%: 15.2 % — AB (ref 0.0–14.0)
NEUT#: 3.4 10*3/uL (ref 1.5–6.5)
NEUT%: 74.2 % (ref 38.4–76.8)
Platelets: 264 10*3/uL (ref 145–400)
RBC: 3.44 10*6/uL — ABNORMAL LOW (ref 3.70–5.45)
RDW: 16.1 % — ABNORMAL HIGH (ref 11.2–14.5)
WBC: 4.6 10*3/uL (ref 3.9–10.3)

## 2013-04-06 NOTE — Telephone Encounter (Signed)
Advised patient to stop Xarelto today for procedure Monday 04/09/13 per Dr Earnest Conroy.  Patient stated she "has had problems in the past if off her medication to prevent clots." 80 mg Lovenox once daily x2 days #5 with no refills called in to [facility] per Dr Earnest Conroy.  Patient also made aware that IR will call her with time and information about her procedure and she should make sure they do not want her to stop the lovenox any sooner than 24 hours prior to the procedure.  Patient verbalized understanding.

## 2013-04-09 ENCOUNTER — Encounter (HOSPITAL_COMMUNITY): Payer: Self-pay

## 2013-04-09 ENCOUNTER — Other Ambulatory Visit (HOSPITAL_COMMUNITY): Payer: Self-pay | Admitting: Interventional Radiology

## 2013-04-09 ENCOUNTER — Other Ambulatory Visit (HOSPITAL_COMMUNITY): Payer: Self-pay | Admitting: Hematology and Oncology

## 2013-04-09 ENCOUNTER — Ambulatory Visit: Payer: 59 | Admitting: Adult Health

## 2013-04-09 ENCOUNTER — Ambulatory Visit (HOSPITAL_COMMUNITY)
Admission: RE | Admit: 2013-04-09 | Discharge: 2013-04-09 | Disposition: A | Discharge status: Home / Self Care | Payer: 59 | Source: Ambulatory Visit | Attending: Hematology and Oncology | Admitting: Hematology and Oncology

## 2013-04-09 ENCOUNTER — Other Ambulatory Visit: Payer: Self-pay

## 2013-04-09 ENCOUNTER — Telehealth: Payer: Self-pay | Admitting: *Deleted

## 2013-04-09 ENCOUNTER — Other Ambulatory Visit: Payer: 59

## 2013-04-09 VITALS — BP 133/77 | HR 85 | Temp 98.2°F | Resp 16

## 2013-04-09 DIAGNOSIS — K831 Obstruction of bile duct: Secondary | ICD-10-CM

## 2013-04-09 DIAGNOSIS — I10 Essential (primary) hypertension: Secondary | ICD-10-CM | POA: Insufficient documentation

## 2013-04-09 DIAGNOSIS — Z79899 Other long term (current) drug therapy: Secondary | ICD-10-CM | POA: Insufficient documentation

## 2013-04-09 DIAGNOSIS — K7200 Acute and subacute hepatic failure without coma: Secondary | ICD-10-CM | POA: Insufficient documentation

## 2013-04-09 DIAGNOSIS — M810 Age-related osteoporosis without current pathological fracture: Secondary | ICD-10-CM | POA: Insufficient documentation

## 2013-04-09 DIAGNOSIS — Z87891 Personal history of nicotine dependence: Secondary | ICD-10-CM | POA: Insufficient documentation

## 2013-04-09 DIAGNOSIS — Z923 Personal history of irradiation: Secondary | ICD-10-CM | POA: Insufficient documentation

## 2013-04-09 DIAGNOSIS — C539 Malignant neoplasm of cervix uteri, unspecified: Secondary | ICD-10-CM | POA: Insufficient documentation

## 2013-04-09 DIAGNOSIS — K219 Gastro-esophageal reflux disease without esophagitis: Secondary | ICD-10-CM | POA: Insufficient documentation

## 2013-04-09 DIAGNOSIS — E785 Hyperlipidemia, unspecified: Secondary | ICD-10-CM | POA: Insufficient documentation

## 2013-04-09 LAB — COMPREHENSIVE METABOLIC PANEL
ALT: 65 U/L — AB (ref 0–35)
AST: 62 U/L — AB (ref 0–37)
Albumin: 2.5 g/dL — ABNORMAL LOW (ref 3.5–5.2)
Alkaline Phosphatase: 807 U/L — ABNORMAL HIGH (ref 39–117)
BILIRUBIN TOTAL: 9.3 mg/dL — AB (ref 0.3–1.2)
BUN: 13 mg/dL (ref 6–23)
CHLORIDE: 100 meq/L (ref 96–112)
CO2: 22 meq/L (ref 19–32)
CREATININE: 0.58 mg/dL (ref 0.50–1.10)
Calcium: 8.3 mg/dL — ABNORMAL LOW (ref 8.4–10.5)
GLUCOSE: 99 mg/dL (ref 70–99)
Potassium: 3.1 mEq/L — ABNORMAL LOW (ref 3.7–5.3)
Sodium: 134 mEq/L — ABNORMAL LOW (ref 137–147)
Total Protein: 6.8 g/dL (ref 6.0–8.3)

## 2013-04-09 LAB — CBC
HCT: 32.3 % — ABNORMAL LOW (ref 36.0–46.0)
Hemoglobin: 10.6 g/dL — ABNORMAL LOW (ref 12.0–15.0)
MCH: 31.6 pg (ref 26.0–34.0)
MCHC: 32.8 g/dL (ref 30.0–36.0)
MCV: 96.4 fL (ref 78.0–100.0)
PLATELETS: 298 10*3/uL (ref 150–400)
RBC: 3.35 MIL/uL — AB (ref 3.87–5.11)
RDW: 15.6 % — ABNORMAL HIGH (ref 11.5–15.5)
WBC: 6.1 10*3/uL (ref 4.0–10.5)

## 2013-04-09 LAB — APTT: aPTT: 37 seconds (ref 24–37)

## 2013-04-09 LAB — PROTIME-INR
INR: 1.11 (ref 0.00–1.49)
Prothrombin Time: 14.1 seconds (ref 11.6–15.2)

## 2013-04-09 MED ORDER — FENTANYL CITRATE 0.05 MG/ML IJ SOLN
INTRAMUSCULAR | Status: AC
  Filled 2013-04-09: qty 4

## 2013-04-09 MED ORDER — POTASSIUM CHLORIDE 10 MEQ/100ML IV SOLN
10.0000 meq | INTRAVENOUS | Status: AC
  Administered 2013-04-09 (×2): 10 meq via INTRAVENOUS
  Filled 2013-04-09 (×2): qty 100

## 2013-04-09 MED ORDER — FENTANYL CITRATE 0.05 MG/ML IJ SOLN
INTRAMUSCULAR | Status: AC | PRN
  Administered 2013-04-09: 100 ug via INTRAVENOUS
  Administered 2013-04-09 (×3): 50 ug via INTRAVENOUS
  Administered 2013-04-09: 100 ug via INTRAVENOUS

## 2013-04-09 MED ORDER — MIDAZOLAM HCL 2 MG/2ML IJ SOLN
INTRAMUSCULAR | Status: AC
  Filled 2013-04-09: qty 4

## 2013-04-09 MED ORDER — HEPARIN SOD (PORK) LOCK FLUSH 100 UNIT/ML IV SOLN
500.0000 [IU] | INTRAVENOUS | Status: AC | PRN
  Administered 2013-04-09: 500 [IU]
  Filled 2013-04-09: qty 5

## 2013-04-09 MED ORDER — PIPERACILLIN-TAZOBACTAM 3.375 G IVPB
3.3750 g | Freq: Once | INTRAVENOUS | Status: AC
  Administered 2013-04-09: 3.375 g via INTRAVENOUS
  Filled 2013-04-09: qty 50

## 2013-04-09 MED ORDER — SODIUM CHLORIDE 0.9 % IV SOLN
INTRAVENOUS | Status: DC
Start: 2013-04-09 — End: 2013-04-10
  Administered 2013-04-09: 11:00:00 via INTRAVENOUS

## 2013-04-09 MED ORDER — POTASSIUM CHLORIDE 10 MEQ/100ML IV SOLN
10.0000 meq | Freq: Once | INTRAVENOUS | Status: DC
Start: 2013-04-09 — End: 2013-04-09
  Filled 2013-04-09: qty 100

## 2013-04-09 MED ORDER — MIDAZOLAM HCL 2 MG/2ML IJ SOLN
INTRAMUSCULAR | Status: AC | PRN
  Administered 2013-04-09: 2 mg via INTRAVENOUS
  Administered 2013-04-09: 1 mg via INTRAVENOUS
  Administered 2013-04-09: 2 mg via INTRAVENOUS
  Administered 2013-04-09: 1 mg via INTRAVENOUS
  Administered 2013-04-09: 2 mg via INTRAVENOUS

## 2013-04-09 MED ORDER — KCL-LACTATED RINGERS 20 MEQ/L IV SOLN
INTRAVENOUS | Status: DC
  Filled 2013-04-09: qty 1000

## 2013-04-09 MED ORDER — LIDOCAINE HCL 1 % IJ SOLN
INTRAMUSCULAR | Status: AC
  Filled 2013-04-09: qty 20

## 2013-04-09 MED ORDER — IOHEXOL 300 MG/ML  SOLN
10.0000 mL | Freq: Once | INTRAMUSCULAR | Status: AC | PRN
  Administered 2013-04-09: 20 mL

## 2013-04-09 MED ORDER — KETOROLAC TROMETHAMINE 30 MG/ML IJ SOLN
30.0000 mg | Freq: Once | INTRAMUSCULAR | Status: AC
  Administered 2013-04-09: 30 mg via INTRAVENOUS
  Filled 2013-04-09: qty 1

## 2013-04-09 MED ORDER — HYDROCODONE-ACETAMINOPHEN 5-325 MG PO TABS
1.0000 | ORAL_TABLET | ORAL | Status: DC | PRN
  Administered 2013-04-09 (×2): 1 via ORAL
  Filled 2013-04-09 (×2): qty 1

## 2013-04-09 NOTE — Progress Notes (Signed)
Spoke to [employer] at the [facility] about 11:15 labs and 11:45 est.pt visit scheduled for today at the [facility].  Informed her pt is here in short stay for procedure and she said she is aware of this. She states to disregard the appointments for today and they would be following up with pt tomorrow.  Also informed her that we drew labwork, cbc,cmp and pt/inr today.

## 2013-04-09 NOTE — Discharge Instructions (Signed)
Moderate Sedation, Adult Moderate sedation is given to help you relax or even sleep through a procedure. You may remain sleepy, be clumsy, or have poor balance for several hours following this procedure. Arrange for a responsible adult, family member, or friend to take you home. A responsible adult should stay with you for at least 24 hours or until the medicines have worn off.  Do not participate in any activities where you could become injured for the next 24 hours, or until you feel normal again. Do not:  Drive.  Swim.  Ride a bicycle.  Operate heavy machinery.  Cook.  Use power tools.  Climb ladders.  Work at [employer].  Do not make important decisions or sign legal documents until you are improved.  Vomiting may occur if you eat too soon. When you can drink without vomiting, try water, juice, or soup. Try solid foods if you feel little or no nausea.  Only take over-the-counter or prescription medications for pain, discomfort, or fever as directed by your caregiver.If pain medications have been prescribed for you, ask your caregiver how soon it is safe to take them.  Make sure you and your family fully understands everything about the medication given to you. Make sure you understand what side effects may occur.  You should not drink alcohol, take sleeping pills, or medications that cause drowsiness for at least 24 hours.  If you smoke, do not smoke alone.  If you are feeling better, you may resume normal activities 24 hours after receiving sedation.  Keep all appointments as scheduled. Follow all instructions.  Ask questions if you do not understand. SEEK MEDICAL CARE IF:   Your skin is pale or bluish in color.  You continue to feel sick to your stomach (nauseous) or throw up (vomit).  Your pain is getting worse and not helped by medication.  You have bleeding or swelling.  You are still sleepy or feeling clumsy after 24 hours. SEEK IMMEDIATE MEDICAL CARE IF:    You develop a rash.  You have difficulty breathing.  You develop any type of allergic problem.  You have a fever. Document Released: 11/10/2000 Document Revised: 05/10/2011 Document Reviewed: 10/23/2012 [facility] Patient Information 2014 [place]. Biliary Drainage Catheter Home Guide A biliary drainage catheter is a tube that is inserted through your skin into the bile ducts in your liver. The purpose of a biliary drainage catheter is to prevent backup of bile into the liver. Bile is a thick yellow or green fluid that helps digest fat in foods. Backup of bile can occur when there is a blockage preventing bile from moving from the bile ducts into your small intestine, as it normally should. This can occur from a tumor, gallstones, or scar tissue. There are three types of biliary drainage:  External biliary drainage With this type, bile is only drained into a collection bag outside your body (external collection bag).  Internal-external biliary drainage Bile is drained to an external collection bag as well as into your small intestine.  Internal biliary drainage Bile is only drained into your small intestine. HOW DO I CHANGE MY DRESSING? The dressing over the drain should be changed at least every other day or more frequently if needed to keep the dressing dry.  1. Wash your hands with soap and water. 2. Gently remove the old dressing. Avoid using scissors to remove the dressing because this may lead to accidental damage to the drain. 3. Once the dressing is removed, inspect the skin  around the drain for redness, swelling, and foul smelling yellow or green discharge. 4. If the drain was sutured to the skin, inspect the suture to verify that it is still anchored in the skin. 5. Clean the skin around the insertion site with mild soap and warm water. Pat the area dry with a clean cloth. 6. Do not apply creams, ointments, or alcohol to the site. Allow the skin to air dry completely before  applying a new dressing. 7. Use a drain sponge (4x4 split gauze) and place the drain through the slit. Cover the drain and the first gauze with a 4x4 gauze. The drain should rest on the gauze and not on the skin. 8. Tape the dressing to the skin. 9. Wash your hands with soap and water. HOW DO I FLUSH A DRAIN NOT ATTACHED TO A BAG? Biliary drains should be flushed daily unless you are instructed otherwise by a health care provider. The end of the drain is closed using an IV cap to which a syringe can be directly connected.  1. Clean the IV cap with an alcohol swab and then screw the tip of a 10 ml normal saline syringe onto the IV cap. 2. Inject the saline over 5-10 seconds. If you feel resistance while injecting, stop immediately. 3. Remove the syringe from the cap. HOW DO I EMPTY MY DRAINAGE BAG? The drainage bag should be emptied when it becomes 2/3 full or before you go to sleep. Most drainage bags have a drainage valve at the bottom that allows them to be emptied easily. 1. Hold the bag over the toilet or basin (or measuring container, if you are directed to measure the drainage). 2. Unscrew the valve to open it, and allow the bag to drain. 3. Close the valve securely to avoid leakage, and wipe it clean with a tissue or disposable napkin. SEEK MEDICAL CARE IF:  Your pain gets worse after an initial improvement.  You have any questions about your tube.  Your redness, soreness, or swelling at the tube insertion site gets worse despite good cleaning.  Your skin breaks down around the tube.  You have leakage of bile around the tube.  Your tube becomes blocked or clogged.  Your catheter is dislodged or comes out. SEEK IMMEDIATE MEDICAL CARE IF:  You have a fever.  You have chills or increased pain. MAKE SURE YOU:  Understand these instructions.  Will watch your condition.  Will get help right away if you are not doing well or get worse. Document Released: 12/06/2012 Document  Reviewed: 10/23/2012 [facility] Patient Information 2014 [place], [place].  Questions or concerns with drain: Call [phone] Interventional Radiology department.

## 2013-04-09 NOTE — H&P (Signed)
[name] is an 63 y.o. female.   Chief Complaint: abdominal pain,jaundice, nausea HPI: Patient with history of metastatic cervical carcinoma, worsening jaundice/hyperbilirubinemia and evidence of left greater than right intrahepatic ductal dilatation on recent CT presents today for PTC with biliary drainage.  Past Medical History  Diagnosis Date  . Diastolic dysfunction   . MIGRAINE HEADACHE   . Atrial fibrillation     failed DCCV, CHADs2-prev pradaxa stopped due to hematuria with ureter issues/JJ   . Anemia   . HTN (hypertension)   . GLAUCOMA   . CARPAL TUNNEL SYNDROME, RIGHT   . Lymphedema     L>R leg from pelvic XRT/scarring  . Hepatic steatosis     CT scan 06/2009, 12/2009  . DVT (deep venous thrombosis) 10/2010    LLE, anticoag resumed  . CHF (congestive heart failure)     1 yr ago per pt  . Arthritis   . GERD (gastroesophageal reflux disease)   . Small kidney     left  . Radiation 10/11/11-11/19/11    5040 cGy in 28 fx's/periaortic  . Radiation 03/16/10-03/25/10    right inguinal node/left external iliac node  . Radiation 06/11/08-07/23/08    6000 cGy left pelvis  . Hyperlipidemia   . Leg swelling     left  . Cervical cancer dx 04/2003, recur 04/2008    s/p debulk (TAHBSO), chemo/XRT; recurrent dz L pelvis dx 2010 and liver met 09/2010 s/p RFA  . Osteoporosis with fracture 2014    compression fx, s/p KP ; started prolia 06/2012- narcotic pain mgmt    Past Surgical History  Procedure Laterality Date  . Ureteral stent placement  2005, 01/2010, 07/2010    L hydro related to cervical ca and XRT  . Oophorectomy  2005  . Cardioversion    . Tonsillectomy    . Cholecystectomy  1984  . Total abdominal hysterectomy  2005  . Carpal tunnel  2009    right  . Ivc filter  04/2011    Family History  Problem Relation Age of Onset  . Lung cancer    . Hypertension    . Heart disease    . Stroke    . Asthma    . Asthma Son   . Cancer Mother     lung  . Stroke Father     Social History:  reports that she quit smoking about 29 years ago. She has never used smokeless tobacco. She reports that she does not drink alcohol or use illicit drugs.  Allergies:  Allergies  Allergen Reactions  . Codeine Rash and Other (See Comments)    Breathing issues  . Flecainide Acetate Other (See Comments)    Doesn't remember reaction  . Morphine Other (See Comments)     Hallucinations  . Propafenone Hcl Other (See Comments)    Leg cramps    Current outpatient prescriptions:bisacodyl (DULCOLAX) 5 MG EC tablet, Take 5 mg by mouth daily as needed for constipation., Disp: , Rfl: ;  cetirizine (ZYRTEC) 10 MG tablet, Take 10 mg by mouth every evening. , Disp: , Rfl: ;  citalopram (CELEXA) 20 MG tablet, Take 20 mg by mouth every evening. , Disp: , Rfl:  diltiazem (CARDIZEM CD) 180 MG 24 hr capsule, Take 1 capsule (180 mg total) by mouth daily. TAke 2-3 hours before of after Metoprolol, Disp: 30 capsule, Rfl: 0;  docusate sodium (COLACE) 100 MG capsule, Take 1 capsule (100 mg total) by mouth 2 (two) times daily., Disp: 60  capsule, Rfl: 1;  fentaNYL (DURAGESIC - DOSED MCG/HR) 100 MCG/HR, Place 1 patch (100 mcg total) onto the skin every 3 (three) days. Total dose $RemoveBe'125mg'iXcoLcrbP$ , Disp: 10 patch, Rfl: 0 fentaNYL (DURAGESIC - DOSED MCG/HR) 50 MCG/HR, Place 1 patch (50 mcg total) onto the skin every 3 (three) days., Disp: 10 patch, Rfl: 0;  ferrous fumarate (HEMOCYTE - 106 MG FE) 325 (106 FE) MG TABS, Take 1 tablet (106 mg of iron total) by mouth daily., Disp: 30 each, Rfl: 0;  folic acid (FOLVITE) 1 MG tablet, Take 1 tablet (1 mg total) by mouth every morning., Disp: 30 tablet, Rfl: 6 HYDROmorphone (DILAUDID) 4 MG tablet, Take 1 tablet every 4 hours as needed for pain, Disp: 120 tablet, Rfl: 0;  ibuprofen (ADVIL,MOTRIN) 200 MG tablet, Take 600 mg by mouth 3 (three) times daily as needed for pain. Pain, Disp: , Rfl: ;  lidocaine-prilocaine (EMLA) cream, Apply 1 application topically daily as needed (for  port access)., Disp: 30 g, Rfl: 6 LORazepam (ATIVAN) 1 MG tablet, Take 1 tablet (1 mg total) by mouth every 6 (six) hours as needed (for nausea (may put under tongue))., Disp: 30 tablet, Rfl: 2;  metoprolol tartrate (LOPRESSOR) 25 MG tablet, Take 3 tablets (75 mg total) by mouth 2 (two) times daily., Disp: 180 tablet, Rfl: 2;  Nutritional Supplements (CARNATION INSTANT BREAKFAST PO), Take 1 Can by mouth 4 (four) times daily as needed (for meals)., Disp: , Rfl:  ondansetron (ZOFRAN) 8 MG tablet, Take 1 tablet (8 mg total) by mouth every 8 (eight) hours as needed for nausea., Disp: 30 tablet, Rfl: 2;  pantoprazole (PROTONIX) 40 MG tablet, Take 1 tablet (40 mg total) by mouth daily., Disp: 30 tablet, Rfl: 2;  polyethylene glycol (MIRALAX / GLYCOLAX) packet, Take 17 g by mouth 2 (two) times daily as needed (constipation). , Disp: , Rfl:  PRESCRIPTION MEDICATION, Inject into the vein every 21 ( twenty-one) days. PEMEtrexed (ALIMTA) 950 mg in sodium chloride 0.9 % 100 mL chemo infusion 500 mg/m2  1.91 m2 (Treatment Plan Actual) md Humphrey Rolls, Disp: , Rfl: ;  protein supplement (UNJURY CHICKEN SOUP) POWD, Take 2 oz by mouth 4 (four) times daily as needed (for meals)., Disp: , Rfl:  Rivaroxaban (XARELTO) 20 MG TABS tablet, Take 1 tablet (20 mg total) by mouth every evening., Disp: 30 tablet, Rfl: 5;  sodium chloride (OCEAN) 0.65 % nasal spray, Place 1 spray into the nose daily as needed for congestion. Allergies, Disp: , Rfl: ;  triamcinolone (NASACORT) 55 MCG/ACT nasal inhaler, Place 2 sprays into the nose daily as needed (allergies or congestion). , Disp: , Rfl:  zolpidem (AMBIEN) 5 MG tablet, Take 1 tablet (5 mg total) by mouth at bedtime as needed for sleep., Disp: 20 tablet, Rfl: 0 Current facility-administered medications:0.9 %  sodium chloride infusion, , Intravenous, Continuous, D Kevin Troi Bechtold, PA-C;  piperacillin-tazobactam (ZOSYN) IVPB 3.375 g, 3.375 g, Intravenous, Once, D Rowe Robert, PA-C  Results for  orders placed during the hospital encounter of 04/09/13  APTT      Result Value Range   aPTT 37  24 - 37 seconds  CBC      Result Value Range   WBC 6.1  4.0 - 10.5 K/uL   RBC 3.35 (*) 3.87 - 5.11 MIL/uL   Hemoglobin 10.6 (*) 12.0 - 15.0 g/dL   HCT 32.3 (*) 36.0 - 46.0 %   MCV 96.4  78.0 - 100.0 fL   MCH 31.6  26.0 - 34.0 pg  MCHC 32.8  30.0 - 36.0 g/dL   RDW 15.6 (*) 11.5 - 15.5 %   Platelets 298  150 - 400 K/uL  COMPREHENSIVE METABOLIC PANEL      Result Value Range   Sodium 134 (*) 137 - 147 mEq/L   Potassium 3.1 (*) 3.7 - 5.3 mEq/L   Chloride 100  96 - 112 mEq/L   CO2 22  19 - 32 mEq/L   Glucose, Bld 99  70 - 99 mg/dL   BUN 13  6 - 23 mg/dL   Creatinine, Ser 0.58  0.50 - 1.10 mg/dL   Calcium 8.3 (*) 8.4 - 10.5 mg/dL   Total Protein 6.8  6.0 - 8.3 g/dL   Albumin 2.5 (*) 3.5 - 5.2 g/dL   AST 62 (*) 0 - 37 U/L   ALT 65 (*) 0 - 35 U/L   Alkaline Phosphatase 807 (*) 39 - 117 U/L   Total Bilirubin 9.3 (*) 0.3 - 1.2 mg/dL   GFR calc non Af Amer >90  >90 mL/min   GFR calc Af Amer >90  >90 mL/min  PROTIME-INR      Result Value Range   Prothrombin Time 14.1  11.6 - 15.2 seconds   INR 1.11  0.00 - 1.49    Review of Systems  Constitutional: Positive for malaise/fatigue.       Occ low grade fevers  Respiratory: Positive for shortness of breath. Negative for cough.   Cardiovascular: Negative for chest pain.  Gastrointestinal: Positive for nausea, vomiting and abdominal pain.       Pale colored stools  Genitourinary: Negative for dysuria.       Dark yellow urine  Musculoskeletal: Positive for back pain.  Neurological: Positive for weakness and headaches.  Endo/Heme/Allergies: Does not bruise/bleed easily.   Filed Vitals:   04/09/13 1006  BP: 123/81  Pulse: 105  Temp: 97.6 F (36.4 C)  TempSrc: Oral  Resp: 16  SpO2: 100%    Physical Exam  Constitutional: She is oriented to person, place, and time. She appears well-developed and well-nourished.  jaundiced  Eyes:  Scleral icterus is present.  Cardiovascular:  irreg irreg  Respiratory: Effort normal and breath sounds normal.  Clean, intact rt chest wall PAC  GI: Soft. Bowel sounds are normal. There is tenderness.  Musculoskeletal: Normal range of motion. She exhibits edema.  Neurological: She is alert and oriented to person, place, and time.     Assessment/Plan Patient with history of metastatic cervical carcinoma, worsening jaundice/hyperbilirubinemia and evidence of left greater than right intrahepatic ductal dilatation on recent CT presents today for PTC with biliary drainage. Details/risks of procedure d/w pt/family with their understanding and consent. Replace K.   Seann Genther,D KEVIN 04/09/2013, 10:03 AM

## 2013-04-09 NOTE — Telephone Encounter (Signed)
Lm gv appt for 04/10/13 @ 9:30am....td

## 2013-04-09 NOTE — Procedures (Signed)
Interventional Radiology Procedure Note  Procedure: PTC and placement of an internal/external 47F percutaneous biliary drain.  Complications: None immediate Recommendations: - ADAT - Will monitor for at least 3 hrs post-procedure - Pt to get 20 mEq KCl - Maintain drain to gravity for 48-72 hrs.  Once bilirubin has reduced, can attempt to cap.   Signed,  Criselda Peaches, MD [facility]

## 2013-04-09 NOTE — Progress Notes (Signed)
Pt has continued pain in abdomen, 7/10, gave 2 hydrocodone with no relief.  Spoke to Dr. Laurence Ferrari and informed him of pt status, he states he will come talk with pt as soon as he is out of current case he is working on.  Pt was informed of this also, pt voiced understanding.

## 2013-04-10 ENCOUNTER — Encounter: Payer: Self-pay | Admitting: Oncology

## 2013-04-10 ENCOUNTER — Ambulatory Visit (HOSPITAL_BASED_OUTPATIENT_CLINIC_OR_DEPARTMENT_OTHER): Payer: 59 | Admitting: Oncology

## 2013-04-10 ENCOUNTER — Telehealth: Payer: Self-pay | Admitting: Oncology

## 2013-04-10 ENCOUNTER — Ambulatory Visit (HOSPITAL_BASED_OUTPATIENT_CLINIC_OR_DEPARTMENT_OTHER): Payer: 59

## 2013-04-10 VITALS — BP 137/91 | HR 93 | Temp 97.7°F | Resp 10 | Ht 61.0 in | Wt 160.7 lb

## 2013-04-10 DIAGNOSIS — K5900 Constipation, unspecified: Secondary | ICD-10-CM

## 2013-04-10 DIAGNOSIS — R1031 Right lower quadrant pain: Secondary | ICD-10-CM

## 2013-04-10 DIAGNOSIS — C539 Malignant neoplasm of cervix uteri, unspecified: Secondary | ICD-10-CM

## 2013-04-10 DIAGNOSIS — I890 Lymphedema, not elsewhere classified: Secondary | ICD-10-CM

## 2013-04-10 DIAGNOSIS — R17 Unspecified jaundice: Secondary | ICD-10-CM

## 2013-04-10 DIAGNOSIS — R112 Nausea with vomiting, unspecified: Secondary | ICD-10-CM

## 2013-04-10 DIAGNOSIS — F341 Dysthymic disorder: Secondary | ICD-10-CM

## 2013-04-10 DIAGNOSIS — I82409 Acute embolism and thrombosis of unspecified deep veins of unspecified lower extremity: Secondary | ICD-10-CM

## 2013-04-10 DIAGNOSIS — E860 Dehydration: Secondary | ICD-10-CM

## 2013-04-10 DIAGNOSIS — C787 Secondary malignant neoplasm of liver and intrahepatic bile duct: Secondary | ICD-10-CM

## 2013-04-10 DIAGNOSIS — G893 Neoplasm related pain (acute) (chronic): Secondary | ICD-10-CM

## 2013-04-10 MED ORDER — ONDANSETRON 16 MG/50ML IVPB (CHCC)
16.0000 mg | Freq: Once | INTRAVENOUS | Status: AC
  Administered 2013-04-10: 16 mg via INTRAVENOUS

## 2013-04-10 MED ORDER — SODIUM CHLORIDE 0.9 % IV SOLN
1000.0000 mL | INTRAVENOUS | Status: DC
  Administered 2013-04-10: 1000 mL via INTRAVENOUS

## 2013-04-10 MED ORDER — HYDROMORPHONE HCL PF 4 MG/ML IJ SOLN
INTRAMUSCULAR | Status: AC
  Filled 2013-04-10: qty 1

## 2013-04-10 MED ORDER — HEPARIN SOD (PORK) LOCK FLUSH 100 UNIT/ML IV SOLN
500.0000 [IU] | Freq: Once | INTRAVENOUS | Status: AC
  Administered 2013-04-10: 500 [IU] via INTRAVENOUS
  Filled 2013-04-10: qty 5

## 2013-04-10 MED ORDER — SODIUM CHLORIDE 0.9 % IJ SOLN
10.0000 mL | Freq: Once | INTRAMUSCULAR | Status: AC
  Administered 2013-04-10: 10 mL via INTRAVENOUS
  Filled 2013-04-10: qty 10

## 2013-04-10 MED ORDER — HYDROMORPHONE HCL PF 1 MG/ML IJ SOLN
2.0000 mg | Freq: Once | INTRAMUSCULAR | Status: DC
Start: 2013-04-10 — End: 2013-04-10
  Administered 2013-04-10: 2 mg via INTRAVENOUS
  Filled 2013-04-10: qty 2

## 2013-04-10 MED ORDER — ONDANSETRON 16 MG/50ML IVPB (CHCC)
INTRAVENOUS | Status: AC
  Filled 2013-04-10: qty 16

## 2013-04-10 MED ORDER — HYDROMORPHONE HCL PF 4 MG/ML IJ SOLN
2.0000 mg | Freq: Once | INTRAMUSCULAR | Status: AC
  Administered 2013-04-10: 2 mg via INTRAVENOUS

## 2013-04-10 NOTE — Progress Notes (Signed)
1320  Patient c/o pain.  Dilaudid 2 mg repeated as ordered.

## 2013-04-10 NOTE — Telephone Encounter (Signed)
, °

## 2013-04-10 NOTE — Progress Notes (Signed)
[name]  [phone] [phone]  OFFICE PROGRESS NOTE   ID: [name]   DOB: 10-17-1950  MR#: [id number]  YQI#:[id number]   PCP: Gwendolyn Grant, MD GYN ONC:  Threasa Heads, MD   DIAGNOSIS: [name] is a 63 y.o. female with metastatic cervical carcinoma.   CURRENT THERAPY: Pemetrexed C#9  Planned  for tomorrow  CHIEF COMPLAINT: followup after placement of percutaneous biliary drain  PRIOR THERAPY: #1 [name] in March 2005 for a stage IB cervical cancer. Her final pathology showed no residual disease and all lymph nodes were negative.  #2 She was followed for 5 years with no evidence of recurrent disease.  She had a recurrence in the right pelvic sidewall in February 2010 which caused an obstruction of the right ureter. s/p radiation therapy and concurrent Cisplatinum chemotherapy.  #3 In late 2011, she was found to have recurrence of the right inguinal lymph node and left pelvic nodes.  She received additional radiation therapy to those areas. Radiation was completed in January 2012.   In April 2012 the patient had resolution of the right inguinal nodes.   #4 In August 2012 the patient was found to have a liver lesion which measured 2.8 cm.  She received 6 cycles of Taxol with a partial response to chemotherapy with a liver lesion decreasing in size to 1.6 cm in 12/2010 PET scan.  PET scan also revealed that there were no other lesions except the liver.  Patient elected to ablate the liver lesion using RFA he performed on 05/07/2011.  The procedure went well but patient developed an extensive DVT in the left lower extremity.  She started receiving anticoagulant therapy with Lovenox and an IVC filter was placed.  #5 s/p radiation therapy in June, August and September 2013.   #6 In November 2013, she was found to have metastasis in left  inguinal lymph nodes. S/p low-dose chemotherapy consisting of Carboplatinum and Taxol in December and January 2014.  Chemotherapy course was complicated by neutropenia.  CT scans in February 2014  Showed an increase in size of the liver metastasis.   #7  She was started on Alimta in May 2014.  She had a difficult time with the first cycle of chemotherapy and did not receive it again until 10/26/2012.   #8  Most recent restaging scans on 01/16/2013 showed in the CT of the abdomen and pelvis no evidence for thoracic metastasis.  There are several liver lesions which are decreased in size from the previous exam. Other lesions are stable. Slight increase in size of retrocaval lymph node within the upper abdomen. Slight increase in abnormal, asymmetric soft tissue within the left external iliac region, suspicious for local metastasis.  In the CT of the chest No axillary or supraclavicular adenopathy. Review of the visualized osseous structures is significant for mild multi level thoracic spondylosis. No aggressive lytic or sclerotic bone lesions  identified.  #9 atrial fibrillation: Patient is on metoprolol and diltiazem  #10 bone health: She is receiving prolia every 6 months. Last dose administered November 2014  #11 jaundice: Status post percutaneous biliary stent.   INTERVAL HISTORY: [name] is a 63 y.o. 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 She was seen last week with development of new onset jaundice. We think it is due to a stricture or progressive disease. Because of the symptom pathology  she did get referral to interventional radiology. She's had a percutaneous biliary stent placed. She feels a little bit better. She tells me her pain is not controlled she has been taking Dilaudid for it. She had been on fentanyl patches but these were discontinued due to the possibility that it may be contributing to her jaundice. However she has  started using them again. She is also has noted taking the hydromorphone. She's also had some nausea off-and-on.but no vomiting up to date. She's denying having any fevers chills or night sweats. [name] is negative.   MEDICAL HISTORY: Past Medical History  Diagnosis Date  . Diastolic dysfunction   . MIGRAINE HEADACHE   . Atrial fibrillation     failed DCCV, CHADs2-prev pradaxa stopped due to hematuria with ureter issues/JJ   . Anemia   . HTN (hypertension)   . GLAUCOMA   . CARPAL TUNNEL SYNDROME, RIGHT   . Lymphedema     L>R leg from pelvic XRT/scarring  . Hepatic steatosis     CT scan 06/2009, 12/2009  . DVT (deep venous thrombosis) 10/2010    LLE, anticoag resumed  . CHF (congestive heart failure)     1 yr ago per pt  . Arthritis   . GERD (gastroesophageal reflux disease)   . Small kidney     left  . Radiation 10/11/11-11/19/11    5040 cGy in 28 fx's/periaortic  . Radiation 03/16/10-03/25/10    right inguinal node/left external iliac node  . Radiation 06/11/08-07/23/08    6000 cGy left pelvis  . Hyperlipidemia   . Leg swelling     left  . Cervical cancer dx 04/2003, recur 04/2008    s/p debulk (TAHBSO), chemo/XRT; recurrent dz L pelvis dx 2010 and liver met 09/2010 s/p RFA  . Osteoporosis with fracture 2014    compression fx, s/p KP ; started prolia 06/2012- narcotic pain mgmt    ALLERGIES:   Allergies  Allergen Reactions  . Codeine Other (See Comments)    Breathing issues  . Flecainide Acetate Other (See Comments)    Leg cramping   . Morphine Other (See Comments)     Hallucinations  . Propafenone Hcl Other (See Comments)    Leg cramps    MEDICATIONS:  Current Outpatient Prescriptions  Medication Sig Dispense Refill  . bisacodyl (DULCOLAX) 5 MG EC tablet Take 5 mg by mouth daily as needed for constipation.      . cetirizine (ZYRTEC) 10 MG tablet Take 10 mg by mouth every evening.       . citalopram (CELEXA) 20 MG tablet Take 20 mg  by mouth every evening.       . diltiazem (CARDIZEM CD) 180 MG 24 hr capsule Take 1 capsule (180 mg total) by mouth daily. TAke 2-3 hours before of after Metoprolol  30 capsule  0  . docusate sodium (COLACE) 100 MG capsule Take 1 capsule (100 mg total) by mouth 2 (two) times daily.  60 capsule  1  . enoxaparin (LOVENOX) 80 MG/0.8ML injection Inject 80 mg into the skin daily.      . fentaNYL (DURAGESIC - DOSED MCG/HR) 100 MCG/HR Place 1 patch (100 mcg total) onto the skin every 3 (three) days. Total dose 168m  10 patch  0  . fentaNYL (DURAGESIC - DOSED MCG/HR) 50 MCG/HR Place 1 patch (50 mcg total) onto the skin every 3 (three) days.  10 patch  0  . ferrous fumarate (HEMOCYTE - 106  MG FE) 325 (106 FE) MG TABS Take 1 tablet (106 mg of iron total) by mouth daily.  30 each  0  . folic acid (FOLVITE) 1 MG tablet Take 1 tablet (1 mg total) by mouth every morning.  30 tablet  6  . HYDROmorphone (DILAUDID) 4 MG tablet Take 1 tablet every 4 hours as needed for pain  120 tablet  0  . ibuprofen (ADVIL,MOTRIN) 200 MG tablet Take 600 mg by mouth 3 (three) times daily as needed for pain. Pain      . lidocaine-prilocaine (EMLA) cream Apply 1 application topically daily as needed (for port access).  30 g  6  . LORazepam (ATIVAN) 1 MG tablet Take 1 tablet (1 mg total) by mouth every 6 (six) hours as needed (for nausea (may put under tongue)).  30 tablet  2  . metoprolol tartrate (LOPRESSOR) 25 MG tablet Take 3 tablets (75 mg total) by mouth 2 (two) times daily.  180 tablet  2  . Nutritional Supplements (CARNATION INSTANT BREAKFAST PO) Take 1 Can by mouth 4 (four) times daily as needed (for meals).      . ondansetron (ZOFRAN) 8 MG tablet Take 1 tablet (8 mg total) by mouth every 8 (eight) hours as needed for nausea.  30 tablet  2  . pantoprazole (PROTONIX) 40 MG tablet Take 1 tablet (40 mg total) by mouth daily.  30 tablet  2  . polyethylene glycol (MIRALAX / GLYCOLAX) packet Take 17 g by mouth 2 (two) times daily as  needed (constipation).       [name] PRESCRIPTION MEDICATION Inject into the vein every 21 ( twenty-one) days. PEMEtrexed (ALIMTA) 950 mg in sodium chloride 0.9 % 100 mL chemo infusion 500 mg/m2  1.91 m2 (Treatment Plan Actual) md Humphrey Rolls      . PRESCRIPTION MEDICATION pegfilgrastim (NEULASTA) injection 6 mg 03/09/13      . protein supplement (UNJURY CHICKEN SOUP) POWD Take 2 oz by mouth 4 (four) times daily as needed (for meals).      . Rivaroxaban (XARELTO) 20 MG TABS tablet Take 1 tablet (20 mg total) by mouth every evening.  30 tablet  5  . sodium chloride (OCEAN) 0.65 % nasal spray Place 1 spray into the nose daily as needed for congestion. Allergies      . triamcinolone (NASACORT) 55 MCG/ACT nasal inhaler Place 2 sprays into the nose daily as needed (allergies or congestion).       [name] zolpidem (AMBIEN) 5 MG tablet Take 1 tablet (5 mg total) by mouth at bedtime as needed for sleep.  20 tablet  0   No current facility-administered medications for this visit.   Facility-Administered Medications Ordered in Other Visits  Medication Dose Route Frequency Provider Last Rate Last Dose  . 0.9 %  sodium chloride infusion  1,000 mL Intravenous Continuous Deatra Robinson, MD   1,000 mL at 04/10/13 1133    SURGICAL HISTORY:  Past Surgical History  Procedure Laterality Date  . Ureteral stent placement  2005, 01/2010, 07/2010    L hydro related to cervical ca and XRT  . Oophorectomy  2005  . Cardioversion    . Tonsillectomy    . Cholecystectomy  1984  . Total abdominal hysterectomy  2005  . Carpal tunnel  2009    right  . Ivc filter  04/2011    REVIEW OF SYSTEMS:  A 10 point review of systems was conducted and pertinent symptoms as stated above.  PHYSICAL EXAMINATION: Blood pressure 137/91, pulse 93, temperature 97.7 F (36.5 C), temperature source Oral, resp. rate 10, height _0  (1.549 m), weight 160 lb 11.2 oz (72.893 kg). Body mass index is 30.38 kg/(m^2). GENERAL: Patient is a well appearing  female in no acute distress HEENT:  Sclerae- icteric   Oropharynx clear and moist. No ulcerations or evidence of oropharyngeal candidiasis. Neck is supple.  NODES:  No cervical, supraclavicular, or axillary lymphadenopathy palpated.  BREAST EXAM:  Deferred. LUNGS:  Clear to auscultation bilaterally.  No wheezes or rhonchi. HEART: Irregular rate and rhythm. No murmur appreciated. ABDOMEN:  Soft, nontender.  Positive, normoactive bowel sounds. No organomegaly palpated. MSK:  No focal spinal tenderness to palpation. Full range of motion bilaterally in the upper extremities. EXTREMITIES:  No peripheral edema.   SKIN:  Clear with no obvious rashes or skin changes. No nail dyscrasia. NEURO:  Nonfocal. Well oriented.  Appropriate affect. ECOG PERFORMANCE STATUS: 1 - Symptomatic but completely ambulatory  LABORATORY DATA: Lab Results  Component Value Date   WBC 6.1 04/09/2013   HGB 10.6* 04/09/2013   HCT 32.3* 04/09/2013   MCV 96.4 04/09/2013   PLT 298 04/09/2013      Chemistry      Component Value Date/Time   NA 134* 04/09/2013 1030   NA 137 04/06/2013 1048   K 3.1* 04/09/2013 1030   K 3.8 04/06/2013 1048   CL 100 04/09/2013 1030   CL 108* 08/15/2012 1252   CO2 22 04/09/2013 1030   CO2 24 04/06/2013 1048   BUN 13 04/09/2013 1030   BUN 7.2 04/06/2013 1048   CREATININE 0.58 04/09/2013 1030   CREATININE 0.7 04/06/2013 1048   CREATININE 1.45* 09/11/2010 1603      Component Value Date/Time   CALCIUM 8.3* 04/09/2013 1030   CALCIUM 8.9 04/06/2013 1048   ALKPHOS 807* 04/09/2013 1030   ALKPHOS 856* 04/06/2013 1048   AST 62* 04/09/2013 1030   AST 60* 04/06/2013 1048   ALT 65* 04/09/2013 1030   ALT 64* 04/06/2013 1048   BILITOT 9.3* 04/09/2013 1030   BILITOT 7.91* 04/06/2013 1048       RADIOGRAPHIC STUDIES: Ct Chest W Contrast 01/16/2013   CLINICAL DATA:  Metastatic cervical cancer  EXAM: CT CHEST, ABDOMEN, AND PELVIS WITH CONTRAST  TECHNIQUE: Multidetector CT imaging of the chest, abdomen and pelvis was performed following the  standard protocol during bolus administration of intravenous contrast.  CONTRAST:  172m OMNIPAQUE IOHEXOL 300 MG/ML  SOLN  COMPARISON:  10/13/2012  FINDINGS: CT CHEST FINDINGS  There is no pleural effusion identified. No airspace consolidation identified. Scattered areas of scarring versus platelike atelectasis identified in both upper lobes. No suspicious nodule or mass noted.  The trachea appears patent and is midline. No enlarged mediastinal or hilar lymph nodes identified. Mild cardiac enlargement. No pericardial effusion. Calcifications involving the thoracic aorta noted.  No axillary or supraclavicular adenopathy. Review of the visualized osseous structures is significant for mild multi level thoracic spondylosis. No aggressive lytic or sclerotic bone lesions identified.  CT ABDOMEN AND PELVIS FINDINGS  Multiple liver metastases are again identified. The index lesion within the dome of liver measures 3.2 cm, image 41/ series 2. Previously 3.4 cm. Central right hepatic lobe lesion measures 3.4 cm, image 45/series 2. This is compared with 4.3 cm previously. Posterior right hepatic lobe lesion measures 4.1 cm, image 53/ series 2. Previously 3.9 cm. Anterior right hepatic lobe lesion measures 6.9 cm, image 52/ series 2. Stable from previous exam.  Slight increase an intrahepatic biliary dilatation. Prior cholecystectomy. The common bile duct is normal in caliber. Normal appearance of the pancreas. The spleen is unremarkable. Normal appearance of both adrenal glands. The right kidney is within normal limits. In stage left kidney is identified. Urinary bladder appears within normal limits. Previous hysterectomy.  Calcified atherosclerotic disease affects the abdominal aorta. Index portacaval lymph node is stable measuring 8 mm. Retrocaval lymph node measures 1 cm and is increased in size from previous exam, image 55/ series 2. Previously this measured 5 mm. Central low attenuation, peripherally enhancing lymph node  within the left external iliac lymph node chain is stable measuring 2 cm. There is an area of increase soft tissue attenuation measuring 2.5 cm, image number 98/series 2.  The stomach appears normal in appearance. The small bowel loops have a normal course and caliber. No obstruction. Normal appearance of the colon.  Review of the visualized osseous structures is significant for compression fractures involving the L1 L2-L3 and L4 vertebra. The L1 and L3 vertebra have been treated with bone cement.  IMPRESSION: 1. No evidence for thoracic metastasis. 2. There are several liver lesions which are decreased in size from the previous exam. Other lesions are stable. 3. Slight increase in size of retrocaval lymph node within the upper abdomen. 4. Slight increase in abnormal, asymmetric soft tissue within the left external iliac region, suspicious for local metastasis.   Electronically Signed   By: Kerby Moors M.D.   On: 01/16/2013 09:06      ASSESSMENT/PLAN: ADYLINE HUBERTY is a  63 y.o. woman with:  #1 Metastatic cervical carcinoma with liver metastasis. Patient is currently receiving alimta dose reduced.patient is status post 8 cycles of Alimta. now progressive disease. She has developed jaundice which is due to likely due to progressive disease. S/P percutaneous biliary stent placement. She has had some relief. She is having pain due to the drain. We discussed extensively about pain control. She will begin fentanyl 150 mcg, also continue dilaudid. We will give   #2 Decrease in appetite, poor po intake: We will arrange for 1 L of half normal saline to be given today   #3 Depression and anxiety - On Celexa.  #4 Poor appetite - using Carnation Instant Breakfast and protein supplement (Unjury chicken soup)  #5 DVT - On Xarelto  #6 Chronic left lower extremity lymphedema   #7  Right lower quadrant pain -decreased fentanyl patch to 100 mcg from 150 mcg patches in view of worsening jaundice.  She will  continue Dilaudid 4 mg every 4 hours as needed and ibuprofen as scheduled.  #8 Constipation -  bowel regimen including increased hydration with water, consumption of fruits/vegetables/fiber, and taking Colace by mouth twice a day was recommended.  #9 atrial fibrillation: She is followed by cardiology  #10. Dehydration: proceed with IVF today  #11. Nausea/vomiting: give IV zofran  #12. We discussed further treatments with second line. We discussed palliative care/hospice and symptom management.    All questions were answered. Mrs. Motsinger was encouraged to contact us in the interim with any questions, concerns, or problems.  I spent 20 minutes counseling the patient face to face.  The total time spent in the appointment was 30 minutes.  Marcy Panning, MD Medical/Oncology [facility] [phone] (beeper) [phone] (Office)  04/10/2013, 7:44 PM

## 2013-04-10 NOTE — Patient Instructions (Signed)
Dehydration, Adult Dehydration is when you lose more fluids from the body than you take in. Vital organs like the kidneys, brain, and heart cannot function without a proper amount of fluids and salt. Any loss of fluids from the body can cause dehydration.  CAUSES   Vomiting.  Diarrhea.  Excessive sweating.  Excessive urine output.  Fever. SYMPTOMS  Mild dehydration  Thirst.  Dry lips.  Slightly dry mouth. Moderate dehydration  Very dry mouth.  Sunken eyes.  Skin does not bounce back quickly when lightly pinched and released.  Dark urine and decreased urine production.  Decreased tear production.  Headache. Severe dehydration  Very dry mouth.  Extreme thirst.  Rapid, weak pulse (more than 100 beats per minute at rest).  Cold hands and feet.  Not able to sweat in spite of heat and temperature.  Rapid breathing.  Blue lips.  Confusion and lethargy.  Difficulty being awakened.  Minimal urine production.  No tears. DIAGNOSIS  Your caregiver will diagnose dehydration based on your symptoms and your exam. Blood and urine tests will help confirm the diagnosis. The diagnostic evaluation should also identify the cause of dehydration. TREATMENT  Treatment of mild or moderate dehydration can often be done at home by increasing the amount of fluids that you drink. It is best to drink small amounts of fluid more often. Drinking too much at one time can make vomiting worse. Refer to the home care instructions below. Severe dehydration needs to be treated at the hospital where you will probably be given intravenous (IV) fluids that contain water and electrolytes. HOME CARE INSTRUCTIONS   Ask your caregiver about specific rehydration instructions.  Drink enough fluids to keep your urine clear or pale yellow.  Drink small amounts frequently if you have nausea and vomiting.  Eat as you normally do.  Avoid:  Foods or drinks high in sugar.  Carbonated  drinks.  Juice.  Extremely hot or cold fluids.  Drinks with caffeine.  Fatty, greasy foods.  Alcohol.  Tobacco.  Overeating.  Gelatin desserts.  Wash your hands well to avoid spreading bacteria and viruses.  Only take over-the-counter or prescription medicines for pain, discomfort, or fever as directed by your caregiver.  Ask your caregiver if you should continue all prescribed and over-the-counter medicines.  Keep all follow-up appointments with your caregiver. SEEK MEDICAL CARE IF:  You have abdominal pain and it increases or stays in one area (localizes).  You have a rash, stiff neck, or severe headache.  You are irritable, sleepy, or difficult to awaken.  You are weak, dizzy, or extremely thirsty. SEEK IMMEDIATE MEDICAL CARE IF:   You are unable to keep fluids down or you get worse despite treatment.  You have frequent episodes of vomiting or diarrhea.  You have blood or green matter (bile) in your vomit.  You have blood in your stool or your stool looks black and tarry.  You have not urinated in 6 to 8 hours, or you have only urinated a small amount of very dark urine.  You have a fever.  You faint. MAKE SURE YOU:   Understand these instructions.  Will watch your condition.  Will get help right away if you are not doing well or get worse. Document Released: 02/15/2005 Document Revised: 05/10/2011 Document Reviewed: 10/05/2010 [employer] Patient Information 2014 [employer].  

## 2013-04-11 ENCOUNTER — Encounter: Payer: Self-pay | Admitting: *Deleted

## 2013-04-11 ENCOUNTER — Telehealth: Payer: Self-pay | Admitting: *Deleted

## 2013-04-11 NOTE — Telephone Encounter (Signed)
Received call from [facility] at [facility] that they are not accepting patient's right now with [employer]. Return number is [id number].

## 2013-04-11 NOTE — Progress Notes (Signed)
This RN made a referral to [facility] for wound care per Dr. Humphrey Rolls. Faxed referral to [phone].

## 2013-04-13 ENCOUNTER — Other Ambulatory Visit (HOSPITAL_BASED_OUTPATIENT_CLINIC_OR_DEPARTMENT_OTHER): Payer: 59

## 2013-04-13 DIAGNOSIS — C539 Malignant neoplasm of cervix uteri, unspecified: Secondary | ICD-10-CM

## 2013-04-13 DIAGNOSIS — E860 Dehydration: Secondary | ICD-10-CM

## 2013-04-13 LAB — CBC WITH DIFFERENTIAL/PLATELET
BASO%: 0.5 % (ref 0.0–2.0)
Basophils Absolute: 0 10*3/uL (ref 0.0–0.1)
EOS ABS: 0 10*3/uL (ref 0.0–0.5)
EOS%: 0.6 % (ref 0.0–7.0)
HCT: 36.1 % (ref 34.8–46.6)
HGB: 11.6 g/dL (ref 11.6–15.9)
LYMPH%: 6.5 % — AB (ref 14.0–49.7)
MCH: 31.3 pg (ref 25.1–34.0)
MCHC: 32.1 g/dL (ref 31.5–36.0)
MCV: 97.3 fL (ref 79.5–101.0)
MONO#: 0.6 10*3/uL (ref 0.1–0.9)
MONO%: 9 % (ref 0.0–14.0)
NEUT%: 83.4 % — ABNORMAL HIGH (ref 38.4–76.8)
NEUTROS ABS: 5.3 10*3/uL (ref 1.5–6.5)
PLATELETS: 345 10*3/uL (ref 145–400)
RBC: 3.71 10*6/uL (ref 3.70–5.45)
RDW: 15.1 % — ABNORMAL HIGH (ref 11.2–14.5)
WBC: 6.3 10*3/uL (ref 3.9–10.3)
lymph#: 0.4 10*3/uL — ABNORMAL LOW (ref 0.9–3.3)

## 2013-04-13 LAB — COMPREHENSIVE METABOLIC PANEL (CC13)
ALBUMIN: 2.5 g/dL — AB (ref 3.5–5.0)
ALT: 47 U/L (ref 0–55)
ANION GAP: 10 meq/L (ref 3–11)
AST: 40 U/L — ABNORMAL HIGH (ref 5–34)
Alkaline Phosphatase: 728 U/L — ABNORMAL HIGH (ref 40–150)
BILIRUBIN TOTAL: 8.11 mg/dL — AB (ref 0.20–1.20)
BUN: 10.3 mg/dL (ref 7.0–26.0)
CO2: 21 meq/L — AB (ref 22–29)
Calcium: 9.1 mg/dL (ref 8.4–10.4)
Chloride: 104 mEq/L (ref 98–109)
Creatinine: 0.8 mg/dL (ref 0.6–1.1)
GLUCOSE: 158 mg/dL — AB (ref 70–140)
Potassium: 3.4 mEq/L — ABNORMAL LOW (ref 3.5–5.1)
Sodium: 135 mEq/L — ABNORMAL LOW (ref 136–145)
Total Protein: 7.1 g/dL (ref 6.4–8.3)

## 2013-04-16 ENCOUNTER — Other Ambulatory Visit: Payer: Self-pay | Admitting: Emergency Medicine

## 2013-04-16 ENCOUNTER — Ambulatory Visit (HOSPITAL_BASED_OUTPATIENT_CLINIC_OR_DEPARTMENT_OTHER): Payer: 59 | Admitting: Adult Health

## 2013-04-16 ENCOUNTER — Ambulatory Visit (HOSPITAL_BASED_OUTPATIENT_CLINIC_OR_DEPARTMENT_OTHER): Payer: 59

## 2013-04-16 ENCOUNTER — Encounter: Payer: Self-pay | Admitting: Adult Health

## 2013-04-16 VITALS — BP 109/78 | HR 110 | Temp 97.5°F | Resp 18 | Ht 61.0 in | Wt 155.0 lb

## 2013-04-16 VITALS — BP 110/72 | HR 84 | Temp 98.0°F | Resp 18

## 2013-04-16 DIAGNOSIS — F341 Dysthymic disorder: Secondary | ICD-10-CM

## 2013-04-16 DIAGNOSIS — C774 Secondary and unspecified malignant neoplasm of inguinal and lower limb lymph nodes: Secondary | ICD-10-CM

## 2013-04-16 DIAGNOSIS — R630 Anorexia: Secondary | ICD-10-CM

## 2013-04-16 DIAGNOSIS — C539 Malignant neoplasm of cervix uteri, unspecified: Secondary | ICD-10-CM

## 2013-04-16 DIAGNOSIS — R1011 Right upper quadrant pain: Secondary | ICD-10-CM

## 2013-04-16 DIAGNOSIS — R17 Unspecified jaundice: Secondary | ICD-10-CM

## 2013-04-16 DIAGNOSIS — C787 Secondary malignant neoplasm of liver and intrahepatic bile duct: Secondary | ICD-10-CM

## 2013-04-16 DIAGNOSIS — E860 Dehydration: Secondary | ICD-10-CM

## 2013-04-16 DIAGNOSIS — K5900 Constipation, unspecified: Secondary | ICD-10-CM

## 2013-04-16 DIAGNOSIS — I82409 Acute embolism and thrombosis of unspecified deep veins of unspecified lower extremity: Secondary | ICD-10-CM

## 2013-04-16 DIAGNOSIS — I10 Essential (primary) hypertension: Secondary | ICD-10-CM

## 2013-04-16 DIAGNOSIS — I890 Lymphedema, not elsewhere classified: Secondary | ICD-10-CM

## 2013-04-16 DIAGNOSIS — E876 Hypokalemia: Secondary | ICD-10-CM

## 2013-04-16 DIAGNOSIS — I4891 Unspecified atrial fibrillation: Secondary | ICD-10-CM

## 2013-04-16 LAB — CBC WITH DIFFERENTIAL/PLATELET
BASO%: 0.8 % (ref 0.0–2.0)
Basophils Absolute: 0.1 10*3/uL (ref 0.0–0.1)
EOS%: 0.7 % (ref 0.0–7.0)
Eosinophils Absolute: 0.1 10*3/uL (ref 0.0–0.5)
HCT: 34.7 % — ABNORMAL LOW (ref 34.8–46.6)
HGB: 11.7 g/dL (ref 11.6–15.9)
LYMPH#: 0.6 10*3/uL — AB (ref 0.9–3.3)
LYMPH%: 8.7 % — ABNORMAL LOW (ref 14.0–49.7)
MCH: 32.5 pg (ref 25.1–34.0)
MCHC: 33.6 g/dL (ref 31.5–36.0)
MCV: 96.5 fL (ref 79.5–101.0)
MONO#: 0.8 10*3/uL (ref 0.1–0.9)
MONO%: 11.6 % (ref 0.0–14.0)
NEUT#: 5.5 10*3/uL (ref 1.5–6.5)
NEUT%: 78.2 % — AB (ref 38.4–76.8)
Platelets: 363 10*3/uL (ref 145–400)
RBC: 3.59 10*6/uL — ABNORMAL LOW (ref 3.70–5.45)
RDW: 14.9 % — AB (ref 11.2–14.5)
WBC: 7.1 10*3/uL (ref 3.9–10.3)

## 2013-04-16 LAB — COMPREHENSIVE METABOLIC PANEL (CC13)
ALBUMIN: 2.5 g/dL — AB (ref 3.5–5.0)
ALT: 49 U/L (ref 0–55)
ANION GAP: 9 meq/L (ref 3–11)
AST: 45 U/L — AB (ref 5–34)
Alkaline Phosphatase: 718 U/L — ABNORMAL HIGH (ref 40–150)
BUN: 10.7 mg/dL (ref 7.0–26.0)
CO2: 22 mEq/L (ref 22–29)
Calcium: 9.4 mg/dL (ref 8.4–10.4)
Chloride: 104 mEq/L (ref 98–109)
Creatinine: 0.8 mg/dL (ref 0.6–1.1)
Glucose: 113 mg/dl (ref 70–140)
POTASSIUM: 3.3 meq/L — AB (ref 3.5–5.1)
SODIUM: 135 meq/L — AB (ref 136–145)
TOTAL PROTEIN: 7.1 g/dL (ref 6.4–8.3)
Total Bilirubin: 7.72 mg/dL — ABNORMAL HIGH (ref 0.20–1.20)

## 2013-04-16 LAB — URINALYSIS, MICROSCOPIC - CHCC
Bilirubin (Urine): POSITIVE
Blood: NEGATIVE
COMMENTS: ABNORMAL
Glucose: NEGATIVE mg/dL
KETONES: NEGATIVE mg/dL
PH: 6 (ref 4.6–8.0)
PROTEIN: 30 mg/dL
Specific Gravity, Urine: 1.03 (ref 1.003–1.035)

## 2013-04-16 MED ORDER — SODIUM CHLORIDE 0.9 % IV SOLN
INTRAVENOUS | Status: DC
  Administered 2013-04-16: 15:00:00 via INTRAVENOUS

## 2013-04-16 MED ORDER — POTASSIUM CHLORIDE ER 8 MEQ PO CPCR: 8.0000 meq | ORAL_CAPSULE | Freq: Two times a day (BID) | ORAL | Status: DC

## 2013-04-16 MED ORDER — HEPARIN SOD (PORK) LOCK FLUSH 100 UNIT/ML IV SOLN
500.0000 [IU] | Freq: Once | INTRAVENOUS | Status: AC
  Administered 2013-04-16: 500 [IU] via INTRAVENOUS
  Filled 2013-04-16: qty 5

## 2013-04-16 MED ORDER — SODIUM CHLORIDE 0.9 % IJ SOLN
10.0000 mL | Freq: Once | INTRAMUSCULAR | Status: AC
  Administered 2013-04-16: 10 mL via INTRAVENOUS
  Filled 2013-04-16: qty 10

## 2013-04-16 NOTE — Patient Instructions (Signed)
Dehydration, Adult Dehydration is when you lose more fluids from the body than you take in. Vital organs like the kidneys, brain, and heart cannot function without a proper amount of fluids and salt. Any loss of fluids from the body can cause dehydration.  CAUSES   Vomiting.  Diarrhea.  Excessive sweating.  Excessive urine output.  Fever. SYMPTOMS  Mild dehydration  Thirst.  Dry lips.  Slightly dry mouth. Moderate dehydration  Very dry mouth.  Sunken eyes.  Skin does not bounce back quickly when lightly pinched and released.  Dark urine and decreased urine production.  Decreased tear production.  Headache. Severe dehydration  Very dry mouth.  Extreme thirst.  Rapid, weak pulse (more than 100 beats per minute at rest).  Cold hands and feet.  Not able to sweat in spite of heat and temperature.  Rapid breathing.  Blue lips.  Confusion and lethargy.  Difficulty being awakened.  Minimal urine production.  No tears. DIAGNOSIS  Your caregiver will diagnose dehydration based on your symptoms and your exam. Blood and urine tests will help confirm the diagnosis. The diagnostic evaluation should also identify the cause of dehydration. TREATMENT  Treatment of mild or moderate dehydration can often be done at home by increasing the amount of fluids that you drink. It is best to drink small amounts of fluid more often. Drinking too much at one time can make vomiting worse. Refer to the home care instructions below. Severe dehydration needs to be treated at the hospital where you will probably be given intravenous (IV) fluids that contain water and electrolytes. HOME CARE INSTRUCTIONS   Ask your caregiver about specific rehydration instructions.  Drink enough fluids to keep your urine clear or pale yellow.  Drink small amounts frequently if you have nausea and vomiting.  Eat as you normally do.  Avoid:  Foods or drinks high in sugar.  Carbonated  drinks.  Juice.  Extremely hot or cold fluids.  Drinks with caffeine.  Fatty, greasy foods.  Alcohol.  Tobacco.  Overeating.  Gelatin desserts.  Wash your hands well to avoid spreading bacteria and viruses.  Only take over-the-counter or prescription medicines for pain, discomfort, or fever as directed by your caregiver.  Ask your caregiver if you should continue all prescribed and over-the-counter medicines.  Keep all follow-up appointments with your caregiver. SEEK MEDICAL CARE IF:  You have abdominal pain and it increases or stays in one area (localizes).  You have a rash, stiff neck, or severe headache.  You are irritable, sleepy, or difficult to awaken.  You are weak, dizzy, or extremely thirsty. SEEK IMMEDIATE MEDICAL CARE IF:   You are unable to keep fluids down or you get worse despite treatment.  You have frequent episodes of vomiting or diarrhea.  You have blood or green matter (bile) in your vomit.  You have blood in your stool or your stool looks black and tarry.  You have not urinated in 6 to 8 hours, or you have only urinated a small amount of very dark urine.  You have a fever.  You faint. MAKE SURE YOU:   Understand these instructions.  Will watch your condition.  Will get help right away if you are not doing well or get worse. Document Released: 02/15/2005 Document Revised: 05/10/2011 Document Reviewed: 10/05/2010 [employer] Patient Information 2014 [employer].  

## 2013-04-16 NOTE — Progress Notes (Signed)
[name]  [phone] [phone]  OFFICE PROGRESS NOTE   ID: [name]   DOB: [birth year]  MR#: [id number]  GXQ#:[id number]   PCP: Gwendolyn Grant, MD GYN ONC:  Threasa Heads, MD   DIAGNOSIS: [name] is a 63 y.o. female with metastatic cervical carcinoma.   CURRENT THERAPY: Pemetrexed C#9  Planned  for tomorrow  CHIEF COMPLAINT: followup after placement of percutaneous biliary drain  PRIOR THERAPY: #1 [name] in March 2005 for a stage IB cervical cancer. Her final pathology showed no residual disease and all lymph nodes were negative.  #2 She was followed for 5 years with no evidence of recurrent disease.  She had a recurrence in the right pelvic sidewall in February 2010 which caused an obstruction of the right ureter. s/p radiation therapy and concurrent Cisplatinum chemotherapy.  #3 In late 2011, she was found to have recurrence of the right inguinal lymph node and left pelvic nodes.  She received additional radiation therapy to those areas. Radiation was completed in January 2012.   In April 2012 the patient had resolution of the right inguinal nodes.   #4 In August 2012 the patient was found to have a liver lesion which measured 2.8 cm.  She received 6 cycles of Taxol with a partial response to chemotherapy with a liver lesion decreasing in size to 1.6 cm in 12/2010 PET scan.  PET scan also revealed that there were no other lesions except the liver.  Patient elected to ablate the liver lesion using RFA he performed on 05/07/2011.  The procedure went well but patient developed an extensive DVT in the left lower extremity.  She started receiving anticoagulant therapy with Lovenox and an IVC filter was placed.  #5 s/p radiation therapy in June, August and September 2013.   #6 In November 2013, she was found to have metastasis in left  inguinal lymph nodes. S/p low-dose chemotherapy consisting of Carboplatinum and Taxol in December and January 2014.  Chemotherapy course was complicated by neutropenia.  CT scans in February 2014  Showed an increase in size of the liver metastasis.   #7  She was started on Alimta in May 2014.  She had a difficult time with the first cycle of chemotherapy and did not receive it again until 10/26/2012.   #8  Most recent restaging scans on 01/16/2013 showed in the CT of the abdomen and pelvis no evidence for thoracic metastasis.  There are several liver lesions which are decreased in size from the previous exam. Other lesions are stable. Slight increase in size of retrocaval lymph node within the upper abdomen. Slight increase in abnormal, asymmetric soft tissue within the left external iliac region, suspicious for local metastasis.  In the CT of the chest No axillary or supraclavicular adenopathy. Review of the visualized osseous structures is significant for mild multi level thoracic spondylosis. No aggressive lytic or sclerotic bone lesions  identified.  #9 atrial fibrillation: Patient is on metoprolol and diltiazem  #10 bone health: She is receiving prolia every 6 months. Last dose administered November 2014  #11 jaundice: Status post percutaneous biliary stent.   INTERVAL HISTORY: [name] is a 63 y.o. 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  She has had a decreased appetite and oral intake and is requesting IV fluids today.  Her percutaneous biliary drain is doing well.  She  says that her jaundice fluctuates, this morning she was very jaundiced, but then she had an increased amount of drain output and the jaundice improved.  She continues to have pain in her RUQ.  It is managed with Fentanyl patch 152mcg and Dilaudid $RemoveBefo'4mg'yRSBRhHTYpr$  po q2h prn.  She is taking the dilaudid around the clock.  Otherwise, a 10 point ROS is neg.    MEDICAL  HISTORY: Past Medical History  Diagnosis Date  . Diastolic dysfunction   . MIGRAINE HEADACHE   . Atrial fibrillation     failed DCCV, CHADs2-prev pradaxa stopped due to hematuria with ureter issues/JJ   . Anemia   . HTN (hypertension)   . GLAUCOMA   . CARPAL TUNNEL SYNDROME, RIGHT   . Lymphedema     L>R leg from pelvic XRT/scarring  . Hepatic steatosis     CT scan 06/2009, 12/2009  . DVT (deep venous thrombosis) 10/2010    LLE, anticoag resumed  . CHF (congestive heart failure)     1 yr ago per pt  . Arthritis   . GERD (gastroesophageal reflux disease)   . Small kidney     left  . Radiation 10/11/11-11/19/11    5040 cGy in 28 fx's/periaortic  . Radiation 03/16/10-03/25/10    right inguinal node/left external iliac node  . Radiation 06/11/08-07/23/08    6000 cGy left pelvis  . Hyperlipidemia   . Leg swelling     left  . Cervical cancer dx 04/2003, recur 04/2008    s/p debulk (TAHBSO), chemo/XRT; recurrent dz L pelvis dx 2010 and liver met 09/2010 s/p RFA  . Osteoporosis with fracture 2014    compression fx, s/p KP ; started prolia 06/2012- narcotic pain mgmt    ALLERGIES:   Allergies  Allergen Reactions  . Codeine Other (See Comments)    Breathing issues  . Flecainide Acetate Other (See Comments)    Leg cramping   . Morphine Other (See Comments)     Hallucinations  . Propafenone Hcl Other (See Comments)    Leg cramps    MEDICATIONS:  Current Outpatient Prescriptions  Medication Sig Dispense Refill  . bisacodyl (DULCOLAX) 5 MG EC tablet Take 5 mg by mouth daily as needed for constipation.      . cetirizine (ZYRTEC) 10 MG tablet Take 10 mg by mouth every evening.       . citalopram (CELEXA) 20 MG tablet Take 20 mg by mouth every evening.       . diltiazem (CARDIZEM CD) 180 MG 24 hr capsule Take 1 capsule (180 mg total) by mouth daily. TAke 2-3 hours before of after Metoprolol  30 capsule  0  . docusate sodium (COLACE) 100 MG capsule Take 1 capsule (100 mg total) by mouth 2  (two) times daily.  60 capsule  1  . enoxaparin (LOVENOX) 80 MG/0.8ML injection Inject 80 mg into the skin daily.      . fentaNYL (DURAGESIC - DOSED MCG/HR) 100 MCG/HR Place 1 patch (100 mcg total) onto the skin every 3 (three) days. Total dose $RemoveBe'125mg'qhPLPpjts$   10 patch  0  . fentaNYL (DURAGESIC - DOSED MCG/HR) 50 MCG/HR Place 1 patch (50 mcg total) onto the skin every 3 (three) days.  10 patch  0  . ferrous fumarate (HEMOCYTE - 106 MG FE) 325 (106 FE) MG TABS Take 1 tablet (106 mg of iron total) by mouth daily.  30 each  0  . folic acid (FOLVITE) 1 MG tablet Take 1 tablet (1 mg  total) by mouth every morning.  30 tablet  6  . HYDROmorphone (DILAUDID) 4 MG tablet Take 1 tablet every 4 hours as needed for pain  120 tablet  0  . ibuprofen (ADVIL,MOTRIN) 200 MG tablet Take 600 mg by mouth 3 (three) times daily as needed for pain. Pain      . lidocaine-prilocaine (EMLA) cream Apply 1 application topically daily as needed (for port access).  30 g  6  . LORazepam (ATIVAN) 1 MG tablet Take 1 tablet (1 mg total) by mouth every 6 (six) hours as needed (for nausea (may put under tongue)).  30 tablet  2  . metoprolol tartrate (LOPRESSOR) 25 MG tablet Take 3 tablets (75 mg total) by mouth 2 (two) times daily.  180 tablet  2  . Nutritional Supplements (CARNATION INSTANT BREAKFAST PO) Take 1 Can by mouth 4 (four) times daily as needed (for meals).      . ondansetron (ZOFRAN) 8 MG tablet Take 1 tablet (8 mg total) by mouth every 8 (eight) hours as needed for nausea.  30 tablet  2  . pantoprazole (PROTONIX) 40 MG tablet Take 1 tablet (40 mg total) by mouth daily.  30 tablet  2  . polyethylene glycol (MIRALAX / GLYCOLAX) packet Take 17 g by mouth 2 (two) times daily as needed (constipation).       [name] PRESCRIPTION MEDICATION Inject into the vein every 21 ( twenty-one) days. PEMEtrexed (ALIMTA) 950 mg in sodium chloride 0.9 % 100 mL chemo infusion 500 mg/m2  1.91 m2 (Treatment Plan Actual) md Humphrey Rolls      . PRESCRIPTION MEDICATION  pegfilgrastim (NEULASTA) injection 6 mg 03/09/13      . protein supplement (UNJURY CHICKEN SOUP) POWD Take 2 oz by mouth 4 (four) times daily as needed (for meals).      . Rivaroxaban (XARELTO) 20 MG TABS tablet Take 1 tablet (20 mg total) by mouth every evening.  30 tablet  5  . sodium chloride (OCEAN) 0.65 % nasal spray Place 1 spray into the nose daily as needed for congestion. Allergies      . triamcinolone (NASACORT) 55 MCG/ACT nasal inhaler Place 2 sprays into the nose daily as needed (allergies or congestion).       [name] zolpidem (AMBIEN) 5 MG tablet Take 1 tablet (5 mg total) by mouth at bedtime as needed for sleep.  20 tablet  0  . potassium chloride (K-DUR) 10 MEQ tablet       . potassium chloride (K-DUR) 10 MEQ tablet Take 1 tablet (10 mEq total) by mouth daily.  3 tablet  0  . Potassium Chloride CR (MICRO-K) 8 MEQ CPCR capsule CR Take 1 capsule (8 mEq total) by mouth 2 (two) times daily.  60 capsule  0   Current Facility-Administered Medications  Medication Dose Route Frequency Provider Last Rate Last Dose  . 0.9 %  sodium chloride infusion   Intravenous Continuous Minette Headland, NP        SURGICAL HISTORY:  Past Surgical History  Procedure Laterality Date  . Ureteral stent placement  2005, 01/2010, 07/2010    L hydro related to cervical ca and XRT  . Oophorectomy  2005  . Cardioversion    . Tonsillectomy    . Cholecystectomy  1984  . Total abdominal hysterectomy  2005  . Carpal tunnel  2009    right  . Ivc filter  04/2011    REVIEW OF SYSTEMS:  A 10 point review of systems was  conducted and pertinent symptoms as stated above.     PHYSICAL EXAMINATION: Blood pressure 109/78, pulse 110, temperature 97.5 F (36.4 C), temperature source Oral, resp. rate 18, height 5\' 1"  (1.549 m), weight 155 lb (70.308 kg). Body mass index is 29.3 kg/(m^2). GENERAL: Patient is a well appearing female in no acute distress HEENT:  Sclerae- icteric   Oropharynx clear and moist. No  ulcerations or evidence of oropharyngeal candidiasis. Neck is supple.  NODES:  No cervical, supraclavicular, or axillary lymphadenopathy palpated.  BREAST EXAM:  Deferred. LUNGS:  Clear to auscultation bilaterally.  No wheezes or rhonchi. HEART: Irregular rate and rhythm. No murmur appreciated. ABDOMEN:  Soft, nontender.  Positive, normoactive bowel sounds. No organomegaly palpated.  Drain in abdomen, no erythema, purulence from drain site. Draining bilious fluid.  MSK:  No focal spinal tenderness to palpation. Full range of motion bilaterally in the upper extremities. EXTREMITIES:  No peripheral edema.   SKIN:  Clear with no obvious rashes or skin changes. No nail dyscrasia. NEURO:  Nonfocal. Well oriented.  Appropriate affect. ECOG PERFORMANCE STATUS: 1 - Symptomatic but completely ambulatory  LABORATORY DATA: Lab Results  Component Value Date   WBC 7.1 04/16/2013   HGB 11.7 04/16/2013   HCT 34.7* 04/16/2013   MCV 96.5 04/16/2013   PLT 363 04/16/2013      Chemistry      Component Value Date/Time   NA 135* 04/16/2013 1301   NA 134* 04/09/2013 1030   K 3.3* 04/16/2013 1301   K 3.1* 04/09/2013 1030   CL 100 04/09/2013 1030   CL 108* 08/15/2012 1252   CO2 22 04/16/2013 1301   CO2 22 04/09/2013 1030   BUN 10.7 04/16/2013 1301   BUN 13 04/09/2013 1030   CREATININE 0.8 04/16/2013 1301   CREATININE 0.58 04/09/2013 1030   CREATININE 1.45* 09/11/2010 1603      Component Value Date/Time   CALCIUM 9.4 04/16/2013 1301   CALCIUM 8.3* 04/09/2013 1030   ALKPHOS 718* 04/16/2013 1301   ALKPHOS 807* 04/09/2013 1030   AST 45* 04/16/2013 1301   AST 62* 04/09/2013 1030   ALT 49 04/16/2013 1301   ALT 65* 04/09/2013 1030   BILITOT 7.72* 04/16/2013 1301   BILITOT 9.3* 04/09/2013 1030       RADIOGRAPHIC STUDIES: Ct Chest W Contrast 01/16/2013   CLINICAL DATA:  Metastatic cervical cancer  EXAM: CT CHEST, ABDOMEN, AND PELVIS WITH CONTRAST  TECHNIQUE: Multidetector CT imaging of the chest, abdomen and pelvis was performed  following the standard protocol during bolus administration of intravenous contrast.  CONTRAST:  01/18/2013 OMNIPAQUE IOHEXOL 300 MG/ML  SOLN  COMPARISON:  10/13/2012  FINDINGS: CT CHEST FINDINGS  There is no pleural effusion identified. No airspace consolidation identified. Scattered areas of scarring versus platelike atelectasis identified in both upper lobes. No suspicious nodule or mass noted.  The trachea appears patent and is midline. No enlarged mediastinal or hilar lymph nodes identified. Mild cardiac enlargement. No pericardial effusion. Calcifications involving the thoracic aorta noted.  No axillary or supraclavicular adenopathy. Review of the visualized osseous structures is significant for mild multi level thoracic spondylosis. No aggressive lytic or sclerotic bone lesions identified.  CT ABDOMEN AND PELVIS FINDINGS  Multiple liver metastases are again identified. The index lesion within the dome of liver measures 3.2 cm, image 41/ series 2. Previously 3.4 cm. Central right hepatic lobe lesion measures 3.4 cm, image 45/series 2. This is compared with 4.3 cm previously. Posterior right hepatic lobe lesion measures 4.1 cm,  image 53/ series 2. Previously 3.9 cm. Anterior right hepatic lobe lesion measures 6.9 cm, image 52/ series 2. Stable from previous exam. Slight increase an intrahepatic biliary dilatation. Prior cholecystectomy. The common bile duct is normal in caliber. Normal appearance of the pancreas. The spleen is unremarkable. Normal appearance of both adrenal glands. The right kidney is within normal limits. In stage left kidney is identified. Urinary bladder appears within normal limits. Previous hysterectomy.  Calcified atherosclerotic disease affects the abdominal aorta. Index portacaval lymph node is stable measuring 8 mm. Retrocaval lymph node measures 1 cm and is increased in size from previous exam, image 55/ series 2. Previously this measured 5 mm. Central low attenuation, peripherally  enhancing lymph node within the left external iliac lymph node chain is stable measuring 2 cm. There is an area of increase soft tissue attenuation measuring 2.5 cm, image number 98/series 2.  The stomach appears normal in appearance. The small bowel loops have a normal course and caliber. No obstruction. Normal appearance of the colon.  Review of the visualized osseous structures is significant for compression fractures involving the L1 L2-L3 and L4 vertebra. The L1 and L3 vertebra have been treated with bone cement.  IMPRESSION: 1. No evidence for thoracic metastasis. 2. There are several liver lesions which are decreased in size from the previous exam. Other lesions are stable. 3. Slight increase in size of retrocaval lymph node within the upper abdomen. 4. Slight increase in abnormal, asymmetric soft tissue within the left external iliac region, suspicious for local metastasis.   Electronically Signed   By: Kerby Moors M.D.   On: 01/16/2013 09:06      ASSESSMENT/PLAN: [name] is a  63 y.o. woman with:  #1 Metastatic cervical carcinoma with liver metastasis. Patient is currently receiving alimta dose reduced.patient is status post 8 cycles of Alimta. now progressive disease. She has developed jaundice which is due to likely due to progressive disease. S/P percutaneous biliary drain placement.  She will continue Fentanyl 150mcg and Dilaudid $RemoveBefo'4mg'nYilfixJlsk$  PO PRN.    #2 Decrease in appetite, poor po intake: We will arrange for 1 L of normal saline to be given today   #3 Depression and anxiety - On Celexa.  #4 Poor appetite - using Carnation Instant Breakfast and protein supplement (Unjury chicken soup)  #5 DVT - On Xarelto  #6 Chronic left lower extremity lymphedema   #7 Constipation -  bowel regimen including increased hydration with water, consumption of fruits/vegetables/fiber, and taking Colace by mouth twice a day was recommended.  This is stable right now.    #9 atrial fibrillation: She is  followed by cardiology  #10. She will return on Thursday, 04/19/13 for labs, and eval by Dr. Humphrey Rolls.     All questions were answered. Mrs. Schwering was encouraged to contact us in the interim with any questions, concerns, or problems.  I spent 25 minutes counseling the patient face to face.  The total time spent in the appointment was 30 minutes.  Minette Headland, [place] [phone] 04/17/2013, 12:05 PM

## 2013-04-17 ENCOUNTER — Encounter: Payer: Self-pay | Admitting: Adult Health

## 2013-04-17 LAB — URINE CULTURE

## 2013-04-19 ENCOUNTER — Ambulatory Visit (HOSPITAL_BASED_OUTPATIENT_CLINIC_OR_DEPARTMENT_OTHER): Payer: 59 | Admitting: Oncology

## 2013-04-19 ENCOUNTER — Other Ambulatory Visit (HOSPITAL_BASED_OUTPATIENT_CLINIC_OR_DEPARTMENT_OTHER): Payer: 59

## 2013-04-19 ENCOUNTER — Ambulatory Visit (HOSPITAL_BASED_OUTPATIENT_CLINIC_OR_DEPARTMENT_OTHER): Payer: 59

## 2013-04-19 ENCOUNTER — Telehealth: Payer: Self-pay | Admitting: *Deleted

## 2013-04-19 ENCOUNTER — Encounter: Payer: Self-pay | Admitting: Oncology

## 2013-04-19 VITALS — BP 111/75 | HR 84 | Temp 97.4°F | Resp 18 | Ht 61.0 in | Wt 158.6 lb

## 2013-04-19 DIAGNOSIS — E860 Dehydration: Secondary | ICD-10-CM

## 2013-04-19 DIAGNOSIS — C539 Malignant neoplasm of cervix uteri, unspecified: Secondary | ICD-10-CM

## 2013-04-19 DIAGNOSIS — G8929 Other chronic pain: Secondary | ICD-10-CM

## 2013-04-19 DIAGNOSIS — E876 Hypokalemia: Secondary | ICD-10-CM

## 2013-04-19 DIAGNOSIS — C774 Secondary and unspecified malignant neoplasm of inguinal and lower limb lymph nodes: Secondary | ICD-10-CM

## 2013-04-19 DIAGNOSIS — I4891 Unspecified atrial fibrillation: Secondary | ICD-10-CM

## 2013-04-19 DIAGNOSIS — C787 Secondary malignant neoplasm of liver and intrahepatic bile duct: Secondary | ICD-10-CM

## 2013-04-19 DIAGNOSIS — I82409 Acute embolism and thrombosis of unspecified deep veins of unspecified lower extremity: Secondary | ICD-10-CM

## 2013-04-19 LAB — COMPREHENSIVE METABOLIC PANEL (CC13)
ALK PHOS: 606 U/L — AB (ref 40–150)
ALT: 53 U/L (ref 0–55)
ANION GAP: 10 meq/L (ref 3–11)
AST: 43 U/L — AB (ref 5–34)
Albumin: 2.4 g/dL — ABNORMAL LOW (ref 3.5–5.0)
BILIRUBIN TOTAL: 7.52 mg/dL — AB (ref 0.20–1.20)
BUN: 10.6 mg/dL (ref 7.0–26.0)
CO2: 22 meq/L (ref 22–29)
CREATININE: 0.7 mg/dL (ref 0.6–1.1)
Calcium: 9.4 mg/dL (ref 8.4–10.4)
Chloride: 104 mEq/L (ref 98–109)
Glucose: 139 mg/dl (ref 70–140)
Potassium: 3.4 mEq/L — ABNORMAL LOW (ref 3.5–5.1)
SODIUM: 135 meq/L — AB (ref 136–145)
TOTAL PROTEIN: 7 g/dL (ref 6.4–8.3)

## 2013-04-19 LAB — CBC WITH DIFFERENTIAL/PLATELET
BASO%: 0.8 % (ref 0.0–2.0)
BASOS ABS: 0.1 10*3/uL (ref 0.0–0.1)
EOS%: 0.8 % (ref 0.0–7.0)
Eosinophils Absolute: 0.1 10*3/uL (ref 0.0–0.5)
HCT: 34.2 % — ABNORMAL LOW (ref 34.8–46.6)
HEMOGLOBIN: 11.5 g/dL — AB (ref 11.6–15.9)
LYMPH#: 0.5 10*3/uL — AB (ref 0.9–3.3)
LYMPH%: 6.2 % — ABNORMAL LOW (ref 14.0–49.7)
MCH: 32.1 pg (ref 25.1–34.0)
MCHC: 33.6 g/dL (ref 31.5–36.0)
MCV: 95.6 fL (ref 79.5–101.0)
MONO#: 0.7 10*3/uL (ref 0.1–0.9)
MONO%: 9.8 % (ref 0.0–14.0)
NEUT#: 6 10*3/uL (ref 1.5–6.5)
NEUT%: 82.4 % — ABNORMAL HIGH (ref 38.4–76.8)
Platelets: 293 10*3/uL (ref 145–400)
RBC: 3.57 10*6/uL — ABNORMAL LOW (ref 3.70–5.45)
RDW: 14.9 % — ABNORMAL HIGH (ref 11.2–14.5)
WBC: 7.3 10*3/uL (ref 3.9–10.3)

## 2013-04-19 MED ORDER — SODIUM CHLORIDE 0.9 % IV SOLN
Freq: Once | INTRAVENOUS | Status: AC
  Administered 2013-04-19: 14:00:00 via INTRAVENOUS
  Filled 2013-04-19: qty 1000

## 2013-04-19 MED ORDER — FENTANYL 100 MCG/HR TD PT72: MEDICATED_PATCH | TRANSDERMAL | Status: DC

## 2013-04-19 MED ORDER — HYDROMORPHONE HCL 4 MG PO TABS: ORAL_TABLET | ORAL | Status: DC

## 2013-04-19 MED ORDER — FOLIC ACID 1 MG PO TABS: 1.0000 mg | ORAL_TABLET | Freq: Every morning | ORAL | Status: AC

## 2013-04-19 MED ORDER — HEPARIN SOD (PORK) LOCK FLUSH 100 UNIT/ML IV SOLN
500.0000 [IU] | Freq: Once | INTRAVENOUS | Status: AC
  Administered 2013-04-19: 500 [IU] via INTRAVENOUS
  Filled 2013-04-19: qty 5

## 2013-04-19 MED ORDER — SODIUM CHLORIDE 0.9 % IJ SOLN
10.0000 mL | INTRAMUSCULAR | Status: DC | PRN
  Administered 2013-04-19: 10 mL via INTRAVENOUS
  Filled 2013-04-19: qty 10

## 2013-04-19 NOTE — Patient Instructions (Signed)
Dehydration, Adult  Dehydration means your body does not have as much fluid as it needs. Your kidneys, brain, and heart will not work properly without the right amount of fluids and salt.   HOME CARE   Ask your doctor how to replace body fluid losses (rehydrate).   Drink enough fluids to keep your pee (urine) clear or pale yellow.   Drink small amounts of fluids often if you feel sick to your stomach (nauseous) or throw up (vomit).   Eat like you normally do.   Avoid:   Foods or drinks high in sugar.   Bubbly (carbonated) drinks.   Juice.   Very hot or cold fluids.   Drinks with caffeine.   Fatty, greasy foods.   Alcohol.   Tobacco.   Eating too much.   Gelatin desserts.   Wash your hands to avoid spreading germs (bacteria, viruses).   Only take medicine as told by your doctor.   Keep all doctor visits as told.  GET HELP RIGHT AWAY IF:    You cannot drink something without throwing up.   You get worse even with treatment.   Your vomit has blood in it or looks greenish.   Your poop (stool) has blood in it or looks black and tarry.   You have not peed in 6 to 8 hours.   You pee a small amount of very dark pee.   You have a fever.   You pass out (faint).   You have belly (abdominal) pain that gets worse or stays in one spot (localizes).   You have a rash, stiff neck, or bad headache.   You get easily annoyed, sleepy, or are hard to wake up.   You feel weak, dizzy, or very thirsty.  MAKE SURE YOU:    Understand these instructions.   Will watch your condition.   Will get help right away if you are not doing well or get worse.  Document Released: 12/12/2008 Document Revised: 05/10/2011 Document Reviewed: 10/05/2010  [employer] Patient Information 2014 [employer].

## 2013-04-19 NOTE — Telephone Encounter (Signed)
appts made and printed. Pt is aware that cs will call w/ IR appt...td

## 2013-04-19 NOTE — Progress Notes (Signed)
Enrolled pt in the [employer] First Step program.  Faxed signed form and activated card today.  °

## 2013-04-20 ENCOUNTER — Encounter: Payer: Self-pay | Admitting: *Deleted

## 2013-04-20 ENCOUNTER — Other Ambulatory Visit: Payer: Self-pay | Admitting: Radiology

## 2013-04-20 ENCOUNTER — Telehealth: Payer: Self-pay | Admitting: *Deleted

## 2013-04-20 ENCOUNTER — Encounter: Payer: Self-pay | Admitting: Oncology

## 2013-04-20 NOTE — Progress Notes (Signed)
RECEIVED A FAX FROM [facility] CONCERNING A PRIOR AUTHORIZATION FOR FENTANYL TRANSDERMAL 100MCG/HR PATCH. THIS REQUEST WAS PLACED IN THE MANAGED CARE BIN.

## 2013-04-20 NOTE — Progress Notes (Signed)
Faxed fentanyl pa form to [facility]

## 2013-04-20 NOTE — Telephone Encounter (Signed)
Notified [name] of MD's response and recommendations.  Also reminded [name] these documented notes would be viewable in Epic.

## 2013-04-20 NOTE — Telephone Encounter (Signed)
Phoned stating that patient is schedule for a procedure on Monday, and needs to be temporarily taken off of her xarelto and possibly put temporarily on lovenox instead.  She needs this info asap.  Please advise.  CB# [name] (W/L Radiology) [phone]

## 2013-04-20 NOTE — Telephone Encounter (Signed)
Per anticoag protocol, no bridge needed for Xarelto as this is equivalent to Lovenox action... Stop Xarelto 24h before procedure - then resume ASAP procedure complete and pt will be "instantly, fully" anticoagulated with 1st oral dose

## 2013-04-22 NOTE — Progress Notes (Signed)
[place]  [phone] [phone]  OFFICE PROGRESS NOTE   ID: [name]   DOB: Jul 08, 1950  MR#: [id number]  GBT#:[id number]   PCP: Gwendolyn Grant, MD GYN ONC:  Threasa Heads, MD   DIAGNOSIS: [name] is a 63 y.o. female with metastatic cervical carcinoma.   CURRENT THERAPY: s/p 9 cycles of alimta  PRIOR THERAPY: #1 s/p radical hysterectomy with bilateral salpingo-oophorectomy and pelvic lymphadenectomy in March 2005 for a stage IB cervical cancer. Her final pathology showed no residual disease and all lymph nodes were negative.  #2 She was followed for 5 years with no evidence of recurrent disease.  She had a recurrence in the right pelvic sidewall in February 2010 which caused an obstruction of the right ureter. s/p radiation therapy and concurrent Cisplatinum chemotherapy.  #3 In late 2011, she was found to have recurrence of the right inguinal lymph node and left pelvic nodes.  She received additional radiation therapy to those areas. Radiation was completed in January 2012.   In April 2012 the patient had resolution of the right inguinal nodes.   #03 October 2010 the patient was found to have a liver lesion which measured 2.8 cm.  She received 6 cycles of Taxol with a partial response to chemotherapy with a liver lesion decreasing in size to 1.6 cm in 12/2010 PET scan.  PET scan also revealed that there were no other lesions except the liver.  Patient elected to ablate the liver lesion using RFA he performed on 05/07/2011.  The procedure went well but patient developed an extensive DVT in the left lower extremity.  She started receiving anticoagulant therapy with Lovenox and an IVC filter was placed.  #5 s/p radiation therapy in June, August and September 2013.   #05 January 2012, she was found to have metastasis in left inguinal lymph nodes. S/p low-dose chemotherapy consisting of Carboplatinum and Taxol in December and January  2014.  Chemotherapy course was complicated by neutropenia.  CT scans in February 2014  Showed an increase in size of the liver metastasis.   #7  s/p Alimta in May 2014 - 10/26/12 x 2 cycles.    #8 restaging scans on 01/16/2013: progresive disease in the pelvis and therefore restarted on single agent alimta with lower doses. Completed another 7 cycles.  #9 atrial fibrillation: Patient is on metoprolol and diltiazem  #10 bone health: She is receiving prolia every 6 months. Last dose administered November 2014  #11 recent restaging scans in January 2015 revealed progressive disease with subsequent deveopment of obstructive jaundice. She is s/p percutaneous biliary stent placment by IR.  INTERVAL HISTORY: [name] is a 63 y.o. female with metastatic cervical carcinoma with metastasis to the livers she has been very weak and fatigued. Her pain is not adequately controlled by the duragesic patches or the diaudid. She has not been eating or drinking much and does look dehydrated. No nausea or vomiting. No fevers or chills. Otherwise, a 10 point ROS is neg.    MEDICAL HISTORY: Past Medical History  Diagnosis Date  . Diastolic dysfunction   . MIGRAINE HEADACHE   . Atrial fibrillation     failed DCCV, CHADs2-prev pradaxa stopped due to hematuria with ureter issues/JJ   . Anemia   . HTN (hypertension)   . GLAUCOMA   . CARPAL TUNNEL SYNDROME, RIGHT   . Lymphedema     L>R leg from pelvic XRT/scarring  . Hepatic steatosis  CT scan 06/2009, 12/2009  . DVT (deep venous thrombosis) 10/2010    LLE, anticoag resumed  . CHF (congestive heart failure)     1 yr ago per pt  . Arthritis   . GERD (gastroesophageal reflux disease)   . Small kidney     left  . Radiation 10/11/11-11/19/11    5040 cGy in 28 fx's/periaortic  . Radiation 03/16/10-03/25/10    right inguinal node/left external iliac node  . Radiation 06/11/08-07/23/08    6000 cGy left pelvis  . Hyperlipidemia   . Leg swelling     left   . Cervical cancer dx 04/2003, recur 04/2008    s/p debulk (TAHBSO), chemo/XRT; recurrent dz L pelvis dx 2010 and liver met 09/2010 s/p RFA  . Osteoporosis with fracture 2014    compression fx, s/p KP ; started prolia 06/2012- narcotic pain mgmt    ALLERGIES:   Allergies  Allergen Reactions  . Codeine Other (See Comments)    Breathing issues  . Flecainide Acetate Other (See Comments)    Leg cramping   . Morphine Other (See Comments)     Hallucinations  . Propafenone Hcl Other (See Comments)    Leg cramps    MEDICATIONS:  Current Outpatient Prescriptions  Medication Sig Dispense Refill  . bisacodyl (DULCOLAX) 5 MG EC tablet Take 5 mg by mouth daily as needed for constipation.      . cetirizine (ZYRTEC) 10 MG tablet Take 10 mg by mouth every evening.       . citalopram (CELEXA) 20 MG tablet Take 20 mg by mouth every evening.       . diltiazem (CARDIZEM CD) 180 MG 24 hr capsule Take 1 capsule (180 mg total) by mouth daily. TAke 2-3 hours before of after Metoprolol  30 capsule  0  . docusate sodium (COLACE) 100 MG capsule Take 1 capsule (100 mg total) by mouth 2 (two) times daily.  60 capsule  1  . enoxaparin (LOVENOX) 80 MG/0.8ML injection Inject 80 mg into the skin daily.      . ferrous fumarate (HEMOCYTE - 106 MG FE) 325 (106 FE) MG TABS Take 1 tablet (106 mg of iron total) by mouth daily.  30 each  0  . folic acid (FOLVITE) 1 MG tablet Take 1 tablet (1 mg total) by mouth every morning.  30 tablet  6  . HYDROmorphone (DILAUDID) 4 MG tablet Take 1 tablet every 4 hours as needed for pain  120 tablet  0  . ibuprofen (ADVIL,MOTRIN) 200 MG tablet Take 600 mg by mouth 3 (three) times daily as needed for pain. Pain      . lidocaine-prilocaine (EMLA) cream Apply 1 application topically daily as needed (for port access).  30 g  6  . LORazepam (ATIVAN) 1 MG tablet Take 1 tablet (1 mg total) by mouth every 6 (six) hours as needed (for nausea (may put under tongue)).  30 tablet  2  . metoprolol  tartrate (LOPRESSOR) 25 MG tablet Take 3 tablets (75 mg total) by mouth 2 (two) times daily.  180 tablet  2  . Nutritional Supplements (CARNATION INSTANT BREAKFAST PO) Take 1 Can by mouth 4 (four) times daily as needed (for meals).      . ondansetron (ZOFRAN) 8 MG tablet Take 1 tablet (8 mg total) by mouth every 8 (eight) hours as needed for nausea.  30 tablet  2  . pantoprazole (PROTONIX) 40 MG tablet Take 1 tablet (40 mg total) by mouth daily.  30 tablet  2  . polyethylene glycol (MIRALAX / GLYCOLAX) packet Take 17 g by mouth 2 (two) times daily as needed (constipation).       . Potassium Chloride CR (MICRO-K) 8 MEQ CPCR capsule CR Take 1 capsule (8 mEq total) by mouth 2 (two) times daily.  60 capsule  0  . PRESCRIPTION MEDICATION Inject into the vein every 21 ( twenty-one) days. PEMEtrexed (ALIMTA) 950 mg in sodium chloride 0.9 % 100 mL chemo infusion 500 mg/m2  1.91 m2 (Treatment Plan Actual) md Humphrey Rolls      . PRESCRIPTION MEDICATION pegfilgrastim (NEULASTA) injection 6 mg 03/09/13      . protein supplement (UNJURY CHICKEN SOUP) POWD Take 2 oz by mouth 4 (four) times daily as needed (for meals).      . Rivaroxaban (XARELTO) 20 MG TABS tablet Take 1 tablet (20 mg total) by mouth every evening.  30 tablet  5  . sodium chloride (OCEAN) 0.65 % nasal spray Place 1 spray into the nose daily as needed for congestion. Allergies      . triamcinolone (NASACORT) 55 MCG/ACT nasal inhaler Place 2 sprays into the nose daily as needed (allergies or congestion).       [name] zolpidem (AMBIEN) 5 MG tablet Take 1 tablet (5 mg total) by mouth at bedtime as needed for sleep.  20 tablet  0  . fentaNYL (DURAGESIC - DOSED MCG/HR) 100 MCG/HR Apply 2 patches every 72 hours (total of 200 mcg)  20 patch  0  . potassium chloride (K-DUR) 10 MEQ tablet       . potassium chloride (K-DUR) 10 MEQ tablet Take 1 tablet (10 mEq total) by mouth daily.  3 tablet  0   No current facility-administered medications for this visit.     SURGICAL HISTORY:  Past Surgical History  Procedure Laterality Date  . Ureteral stent placement  2005, 01/2010, 07/2010    L hydro related to cervical ca and XRT  . Oophorectomy  2005  . Cardioversion    . Tonsillectomy    . Cholecystectomy  1984  . Total abdominal hysterectomy  2005  . Carpal tunnel  2009    right  . Ivc filter  04/2011    REVIEW OF SYSTEMS:  A 10 point review of systems was conducted and pertinent symptoms as stated above.     PHYSICAL EXAMINATION: Blood pressure 111/75, pulse 84, temperature 97.4 F (36.3 C), temperature source Oral, resp. rate 18, height _0  (1.549 m), weight 158 lb 9.6 oz (71.94 kg). Body mass index is 29.98 kg/(m^2). GENERAL: Patient is a well appearing female in no acute distress HEENT:  Sclerae- icteric   Oropharynx clear and moist. No ulcerations or evidence of oropharyngeal candidiasis. Neck is supple.  NODES:  No cervical, supraclavicular, or axillary lymphadenopathy palpated.  BREAST EXAM:  Deferred. LUNGS:  Clear to auscultation bilaterally.  No wheezes or rhonchi. HEART: Irregular rate and rhythm. No murmur appreciated. ABDOMEN:  Soft, nontender.  Positive, normoactive bowel sounds. No organomegaly palpated.  Drain in abdomen, no erythema, purulence from drain site. Draining bilious fluid.  MSK:  No focal spinal tenderness to palpation. Full range of motion bilaterally in the upper extremities. EXTREMITIES:  No peripheral edema.   SKIN:  Clear with no obvious rashes or skin changes. No nail dyscrasia. NEURO:  Nonfocal. Well oriented.  Appropriate affect. ECOG PERFORMANCE STATUS: 1 - Symptomatic but completely ambulatory  LABORATORY DATA: Lab Results  Component Value Date   WBC 7.3 04/19/2013  HGB 11.5* 04/19/2013   HCT 34.2* 04/19/2013   MCV 95.6 04/19/2013   PLT 293 04/19/2013      Chemistry      Component Value Date/Time   NA 135* 04/19/2013 1210   NA 134* 04/09/2013 1030   K 3.4* 04/19/2013 1210   K 3.1* 04/09/2013 1030    CL 100 04/09/2013 1030   CL 108* 08/15/2012 1252   CO2 22 04/19/2013 1210   CO2 22 04/09/2013 1030   BUN 10.6 04/19/2013 1210   BUN 13 04/09/2013 1030   CREATININE 0.7 04/19/2013 1210   CREATININE 0.58 04/09/2013 1030   CREATININE 1.45* 09/11/2010 1603      Component Value Date/Time   CALCIUM 9.4 04/19/2013 1210   CALCIUM 8.3* 04/09/2013 1030   ALKPHOS 606* 04/19/2013 1210   ALKPHOS 807* 04/09/2013 1030   AST 43* 04/19/2013 1210   AST 62* 04/09/2013 1030   ALT 53 04/19/2013 1210   ALT 65* 04/09/2013 1030   BILITOT 7.52* 04/19/2013 1210   BILITOT 9.3* 04/09/2013 1030       RADIOGRAPHIC STUDIES: Ct Chest W Contrast 01/16/2013   CLINICAL DATA:  Metastatic cervical cancer  EXAM: CT CHEST, ABDOMEN, AND PELVIS WITH CONTRAST  TECHNIQUE: Multidetector CT imaging of the chest, abdomen and pelvis was performed following the standard protocol during bolus administration of intravenous contrast.  CONTRAST:  166mL OMNIPAQUE IOHEXOL 300 MG/ML  SOLN  COMPARISON:  10/13/2012  FINDINGS: CT CHEST FINDINGS  There is no pleural effusion identified. No airspace consolidation identified. Scattered areas of scarring versus platelike atelectasis identified in both upper lobes. No suspicious nodule or mass noted.  The trachea appears patent and is midline. No enlarged mediastinal or hilar lymph nodes identified. Mild cardiac enlargement. No pericardial effusion. Calcifications involving the thoracic aorta noted.  No axillary or supraclavicular adenopathy. Review of the visualized osseous structures is significant for mild multi level thoracic spondylosis. No aggressive lytic or sclerotic bone lesions identified.  CT ABDOMEN AND PELVIS FINDINGS  Multiple liver metastases are again identified. The index lesion within the dome of liver measures 3.2 cm, image 41/ series 2. Previously 3.4 cm. Central right hepatic lobe lesion measures 3.4 cm, image 45/series 2. This is compared with 4.3 cm previously. Posterior right hepatic lobe lesion  measures 4.1 cm, image 53/ series 2. Previously 3.9 cm. Anterior right hepatic lobe lesion measures 6.9 cm, image 52/ series 2. Stable from previous exam. Slight increase an intrahepatic biliary dilatation. Prior cholecystectomy. The common bile duct is normal in caliber. Normal appearance of the pancreas. The spleen is unremarkable. Normal appearance of both adrenal glands. The right kidney is within normal limits. In stage left kidney is identified. Urinary bladder appears within normal limits. Previous hysterectomy.  Calcified atherosclerotic disease affects the abdominal aorta. Index portacaval lymph node is stable measuring 8 mm. Retrocaval lymph node measures 1 cm and is increased in size from previous exam, image 55/ series 2. Previously this measured 5 mm. Central low attenuation, peripherally enhancing lymph node within the left external iliac lymph node chain is stable measuring 2 cm. There is an area of increase soft tissue attenuation measuring 2.5 cm, image number 98/series 2.  The stomach appears normal in appearance. The small bowel loops have a normal course and caliber. No obstruction. Normal appearance of the colon.  Review of the visualized osseous structures is significant for compression fractures involving the L1 L2-L3 and L4 vertebra. The L1 and L3 vertebra have been treated with bone cement.  IMPRESSION: 1. No evidence for thoracic metastasis. 2. There are several liver lesions which are decreased in size from the previous exam. Other lesions are stable. 3. Slight increase in size of retrocaval lymph node within the upper abdomen. 4. Slight increase in abnormal, asymmetric soft tissue within the left external iliac region, suspicious for local metastasis.   Electronically Signed   By: Kerby Moors M.D.   On: 01/16/2013 09:06      ASSESSMENT/PLAN: [name] is a  63 y.o. woman with:  #1 Metastatic cervical carcinoma with liver metastasis. Patient is currently receiving alimta  dose reduced.patient is status post 8 cycles of Alimta. now progressive disease. She has developed jaundice which is due to likely due to progressive disease. S/P percutaneous biliary drain placement.  We will arange with IR to internalize the drain  2. Chronic uncontrolled pain: She will continue Fentanyl 263mg and Dilaudid 420mPO PRN.    #3 Decrease in appetite, poor po intake: We will arrange for 1 L of normal saline to be given today   #4 Depression and anxiety - On Celexa.  #5 Poor appetite - using Carnation Instant Breakfast and protein supplement (Unjury chicken soup)  #6 DVT - On Xarelto  #7 Chronic left lower extremity lymphedema   #8 Constipation -  bowel regimen including increased hydration with water, consumption of fruits/vegetables/fiber, and taking Colace by mouth twice a day was recommended.  This is stable right now.    #10 atrial fibrillation: She is followed by cardiology      All questions were answered. Mrs. StConkleas encouraged to contact usKorean the interim with any questions, concerns, or problems.  I spent 25 minutes counseling the patient face to face.  The total time spent in the appointment was 30 minutes.  KaMarcy PanningMD Medical/Oncology CoSpect[facility] [phone]) [phone])

## 2013-04-23 ENCOUNTER — Ambulatory Visit (HOSPITAL_COMMUNITY)
Admission: RE | Admit: 2013-04-23 | Discharge: 2013-04-23 | Disposition: A | Discharge status: Home / Self Care | Payer: 59 | Source: Ambulatory Visit | Attending: Oncology | Admitting: Oncology

## 2013-04-23 ENCOUNTER — Telehealth: Payer: Self-pay | Admitting: Internal Medicine

## 2013-04-23 ENCOUNTER — Encounter: Payer: Self-pay | Admitting: Oncology

## 2013-04-23 ENCOUNTER — Other Ambulatory Visit: Payer: Self-pay | Admitting: Oncology

## 2013-04-23 ENCOUNTER — Encounter (HOSPITAL_COMMUNITY): Payer: Self-pay

## 2013-04-23 DIAGNOSIS — K7689 Other specified diseases of liver: Secondary | ICD-10-CM | POA: Diagnosis not present

## 2013-04-23 DIAGNOSIS — K831 Obstruction of bile duct: Secondary | ICD-10-CM | POA: Insufficient documentation

## 2013-04-23 DIAGNOSIS — C539 Malignant neoplasm of cervix uteri, unspecified: Secondary | ICD-10-CM

## 2013-04-23 MED ORDER — IOHEXOL 300 MG/ML  SOLN
10.0000 mL | Freq: Once | INTRAMUSCULAR | Status: AC | PRN
  Administered 2013-04-23: 10 mL

## 2013-04-23 MED ORDER — SODIUM CHLORIDE 0.9 % IV SOLN: Freq: Once | INTRAVENOUS | Status: DC

## 2013-04-23 MED ORDER — CIPROFLOXACIN IN D5W 400 MG/200ML IV SOLN
400.0000 mg | Freq: Once | INTRAVENOUS | Status: DC
Start: 2013-04-23 — End: 2013-04-24

## 2013-04-23 NOTE — Telephone Encounter (Signed)
04/23/2013  Pt is requesting a refill on rx

## 2013-04-23 NOTE — Progress Notes (Signed)
No sedation given, pt. Ambulated to the car, pt. To return on Friday, instructions given to pt. Per IR.

## 2013-04-23 NOTE — Progress Notes (Signed)
[facility], [id number], states that fentanyl does not require precert and the pharmacy needs to contact the help desk

## 2013-04-23 NOTE — Telephone Encounter (Signed)
A user error has taken place.

## 2013-04-24 ENCOUNTER — Other Ambulatory Visit: Payer: Self-pay | Admitting: Radiology

## 2013-04-24 ENCOUNTER — Telehealth: Payer: Self-pay | Admitting: Dietician

## 2013-04-24 NOTE — Telephone Encounter (Signed)
Brief Outpatient Oncology Nutrition Note  Patient has been identified to be at risk on malnutrition screen.  Wt Readings from Last 10 Encounters:  04/19/13 158 lb 9.6 oz (71.94 kg)  04/16/13 155 lb (70.308 kg)  04/10/13 160 lb 11.2 oz (72.893 kg)  04/05/13 161 lb 12.8 oz (73.392 kg)  03/29/13 166 lb (75.297 kg)  03/15/13 163 lb 4.8 oz (74.072 kg)  03/08/13 166 lb 4.8 oz (75.433 kg)  02/14/13 169 lb 14.4 oz (77.066 kg)  02/08/13 180 lb 3.2 oz (81.738 kg)  01/23/13 170 lb 3.1 oz (77.199 kg)   Patient with metastatic cervical cancer.  Weight loss as above and poor appetite per chart.  Patient is drinking  Theme park manager and Unjury chicken broth.  Attempted to call patient who is unavailable.  Left message with the [facility] contact information.  Antonieta Iba, RD, LDN

## 2013-04-25 ENCOUNTER — Encounter (HOSPITAL_COMMUNITY): Payer: Self-pay | Admitting: Pharmacist

## 2013-04-26 ENCOUNTER — Other Ambulatory Visit: Payer: 59

## 2013-04-26 ENCOUNTER — Ambulatory Visit: Payer: 59

## 2013-04-26 ENCOUNTER — Telehealth: Payer: Self-pay | Admitting: Oncology

## 2013-04-26 NOTE — Telephone Encounter (Signed)
received message from switchboard re calling pt to r/s appt. called pt on both home/cell not able to reach pt at home or lm. lmonvm for pt on cell asking that she call us back re when she can come in

## 2013-04-27 ENCOUNTER — Observation Stay (HOSPITAL_COMMUNITY)
Admission: RE | Admit: 2013-04-27 | Discharge: 2013-04-28 | Disposition: A | Discharge status: Home / Self Care | Payer: 59 | Source: Ambulatory Visit | Attending: Interventional Radiology | Admitting: Interventional Radiology

## 2013-04-27 ENCOUNTER — Other Ambulatory Visit: Payer: Self-pay | Admitting: Oncology

## 2013-04-27 ENCOUNTER — Other Ambulatory Visit (HOSPITAL_COMMUNITY): Payer: Self-pay | Admitting: Interventional Radiology

## 2013-04-27 ENCOUNTER — Telehealth: Payer: Self-pay | Admitting: Oncology

## 2013-04-27 ENCOUNTER — Encounter (HOSPITAL_COMMUNITY): Payer: Self-pay

## 2013-04-27 ENCOUNTER — Ambulatory Visit (HOSPITAL_COMMUNITY)
Admission: RE | Admit: 2013-04-27 | Discharge: 2013-04-27 | Disposition: A | Discharge status: Home / Self Care | Payer: 59 | Source: Ambulatory Visit | Attending: Oncology | Admitting: Oncology

## 2013-04-27 VITALS — BP 120/78 | HR 106 | Temp 98.8°F | Resp 18 | Ht 61.0 in | Wt 158.0 lb

## 2013-04-27 DIAGNOSIS — C539 Malignant neoplasm of cervix uteri, unspecified: Secondary | ICD-10-CM

## 2013-04-27 DIAGNOSIS — C801 Malignant (primary) neoplasm, unspecified: Secondary | ICD-10-CM | POA: Diagnosis present

## 2013-04-27 DIAGNOSIS — Z90710 Acquired absence of both cervix and uterus: Secondary | ICD-10-CM | POA: Diagnosis not present

## 2013-04-27 DIAGNOSIS — I4891 Unspecified atrial fibrillation: Secondary | ICD-10-CM | POA: Insufficient documentation

## 2013-04-27 DIAGNOSIS — C787 Secondary malignant neoplasm of liver and intrahepatic bile duct: Secondary | ICD-10-CM | POA: Insufficient documentation

## 2013-04-27 DIAGNOSIS — Z9221 Personal history of antineoplastic chemotherapy: Secondary | ICD-10-CM | POA: Diagnosis not present

## 2013-04-27 DIAGNOSIS — D649 Anemia, unspecified: Secondary | ICD-10-CM | POA: Diagnosis not present

## 2013-04-27 DIAGNOSIS — R6883 Chills (without fever): Secondary | ICD-10-CM | POA: Diagnosis not present

## 2013-04-27 DIAGNOSIS — H409 Unspecified glaucoma: Secondary | ICD-10-CM | POA: Insufficient documentation

## 2013-04-27 DIAGNOSIS — K831 Obstruction of bile duct: Secondary | ICD-10-CM

## 2013-04-27 DIAGNOSIS — Z9089 Acquired absence of other organs: Secondary | ICD-10-CM | POA: Insufficient documentation

## 2013-04-27 DIAGNOSIS — Z87891 Personal history of nicotine dependence: Secondary | ICD-10-CM | POA: Diagnosis not present

## 2013-04-27 DIAGNOSIS — Z79899 Other long term (current) drug therapy: Secondary | ICD-10-CM | POA: Insufficient documentation

## 2013-04-27 DIAGNOSIS — M810 Age-related osteoporosis without current pathological fracture: Secondary | ICD-10-CM | POA: Diagnosis not present

## 2013-04-27 DIAGNOSIS — Z7901 Long term (current) use of anticoagulants: Secondary | ICD-10-CM | POA: Insufficient documentation

## 2013-04-27 DIAGNOSIS — K7200 Acute and subacute hepatic failure without coma: Secondary | ICD-10-CM | POA: Insufficient documentation

## 2013-04-27 DIAGNOSIS — K219 Gastro-esophageal reflux disease without esophagitis: Secondary | ICD-10-CM | POA: Insufficient documentation

## 2013-04-27 DIAGNOSIS — Z923 Personal history of irradiation: Secondary | ICD-10-CM | POA: Insufficient documentation

## 2013-04-27 DIAGNOSIS — E785 Hyperlipidemia, unspecified: Secondary | ICD-10-CM | POA: Diagnosis not present

## 2013-04-27 DIAGNOSIS — E876 Hypokalemia: Secondary | ICD-10-CM | POA: Insufficient documentation

## 2013-04-27 DIAGNOSIS — I10 Essential (primary) hypertension: Secondary | ICD-10-CM | POA: Diagnosis not present

## 2013-04-27 DIAGNOSIS — Z86718 Personal history of other venous thrombosis and embolism: Secondary | ICD-10-CM | POA: Insufficient documentation

## 2013-04-27 HISTORY — DX: Obstruction of bile duct: K83.1

## 2013-04-27 HISTORY — DX: Malignant (primary) neoplasm, unspecified: C80.1

## 2013-04-27 LAB — COMPREHENSIVE METABOLIC PANEL
ALBUMIN: 2.1 g/dL — AB (ref 3.5–5.2)
ALK PHOS: 647 U/L — AB (ref 39–117)
ALT: 47 U/L — ABNORMAL HIGH (ref 0–35)
AST: 42 U/L — ABNORMAL HIGH (ref 0–37)
BILIRUBIN TOTAL: 6.5 mg/dL — AB (ref 0.3–1.2)
BUN: 10 mg/dL (ref 6–23)
CHLORIDE: 100 meq/L (ref 96–112)
CO2: 21 mEq/L (ref 19–32)
CREATININE: 0.56 mg/dL (ref 0.50–1.10)
Calcium: 8.3 mg/dL — ABNORMAL LOW (ref 8.4–10.5)
GLUCOSE: 89 mg/dL (ref 70–99)
Potassium: 3.5 mEq/L — ABNORMAL LOW (ref 3.7–5.3)
Sodium: 135 mEq/L — ABNORMAL LOW (ref 137–147)
Total Protein: 6.3 g/dL (ref 6.0–8.3)

## 2013-04-27 LAB — PROTIME-INR
INR: 1.37 (ref 0.00–1.49)
PROTHROMBIN TIME: 16.5 s — AB (ref 11.6–15.2)

## 2013-04-27 LAB — CBC WITH DIFFERENTIAL/PLATELET
Basophils Absolute: 0 10*3/uL (ref 0.0–0.1)
Basophils Relative: 0 % (ref 0–1)
EOS ABS: 0.1 10*3/uL (ref 0.0–0.7)
Eosinophils Relative: 2 % (ref 0–5)
HEMATOCRIT: 28.4 % — AB (ref 36.0–46.0)
Hemoglobin: 9.5 g/dL — ABNORMAL LOW (ref 12.0–15.0)
LYMPHS ABS: 0.7 10*3/uL (ref 0.7–4.0)
Lymphocytes Relative: 12 % (ref 12–46)
MCH: 31.3 pg (ref 26.0–34.0)
MCHC: 33.5 g/dL (ref 30.0–36.0)
MCV: 93.4 fL (ref 78.0–100.0)
MONOS PCT: 13 % — AB (ref 3–12)
Monocytes Absolute: 0.8 10*3/uL (ref 0.1–1.0)
NEUTROS ABS: 4.2 10*3/uL (ref 1.7–7.7)
Neutrophils Relative %: 73 % (ref 43–77)
Platelets: 265 10*3/uL (ref 150–400)
RBC: 3.04 MIL/uL — ABNORMAL LOW (ref 3.87–5.11)
RDW: 14.2 % (ref 11.5–15.5)
WBC: 5.7 10*3/uL (ref 4.0–10.5)

## 2013-04-27 LAB — APTT: aPTT: 47 seconds — ABNORMAL HIGH (ref 24–37)

## 2013-04-27 MED ORDER — HYDROMORPHONE 0.3 MG/ML IV SOLN
INTRAVENOUS | Status: AC
  Filled 2013-04-27: qty 25

## 2013-04-27 MED ORDER — HYDROMORPHONE 0.3 MG/ML IV SOLN
INTRAVENOUS | Status: DC
  Administered 2013-04-27: 11:00:00 via INTRAVENOUS
  Administered 2013-04-27: 0.6 mg via INTRAVENOUS
  Administered 2013-04-28: 5.7 mg via INTRAVENOUS
  Administered 2013-04-28: 0.9 mg via INTRAVENOUS
  Administered 2013-04-28: 1.2 mg via INTRAVENOUS
  Administered 2013-04-28: 08:00:00 via INTRAVENOUS
  Filled 2013-04-27: qty 25

## 2013-04-27 MED ORDER — PIPERACILLIN-TAZOBACTAM 3.375 G IVPB
3.3750 g | Freq: Three times a day (TID) | INTRAVENOUS | Status: DC
  Administered 2013-04-27 – 2013-04-28 (×3): 3.375 g via INTRAVENOUS
  Filled 2013-04-27 (×4): qty 50

## 2013-04-27 MED ORDER — SODIUM CHLORIDE 0.9 % IV SOLN
INTRAVENOUS | Status: DC
  Administered 2013-04-27 (×3): via INTRAVENOUS
  Administered 2013-04-28: 100 mL/h via INTRAVENOUS

## 2013-04-27 MED ORDER — DIPHENHYDRAMINE HCL 12.5 MG/5ML PO ELIX
12.5000 mg | ORAL_SOLUTION | Freq: Four times a day (QID) | ORAL | Status: DC | PRN
  Filled 2013-04-27: qty 5

## 2013-04-27 MED ORDER — NALOXONE HCL 0.4 MG/ML IJ SOLN: 0.4000 mg | INTRAMUSCULAR | Status: DC | PRN

## 2013-04-27 MED ORDER — IOHEXOL 300 MG/ML  SOLN
20.0000 mL | Freq: Once | INTRAMUSCULAR | Status: AC | PRN
  Administered 2013-04-27: 40 mL via INTRATHECAL

## 2013-04-27 MED ORDER — MEPERIDINE HCL 50 MG/ML IJ SOLN
50.0000 mg | Freq: Once | INTRAMUSCULAR | Status: AC
  Administered 2013-04-27: 50 mg via INTRAVENOUS
  Filled 2013-04-27: qty 1

## 2013-04-27 MED ORDER — SODIUM CHLORIDE 0.9 % IV SOLN: 25.0000 mg/h | Freq: Once | INTRAVENOUS | Status: DC

## 2013-04-27 MED ORDER — SODIUM CHLORIDE 0.9 % IJ SOLN: 9.0000 mL | INTRAMUSCULAR | Status: DC | PRN

## 2013-04-27 MED ORDER — FENTANYL CITRATE 0.05 MG/ML IJ SOLN
INTRAMUSCULAR | Status: AC | PRN
  Administered 2013-04-27: 200 ug via INTRAVENOUS
  Administered 2013-04-27: 100 ug via INTRAVENOUS

## 2013-04-27 MED ORDER — LIDOCAINE HCL 1 % IJ SOLN
INTRAMUSCULAR | Status: AC
  Filled 2013-04-27: qty 20

## 2013-04-27 MED ORDER — FENTANYL CITRATE 0.05 MG/ML IJ SOLN
INTRAMUSCULAR | Status: AC
  Filled 2013-04-27: qty 6

## 2013-04-27 MED ORDER — ONDANSETRON HCL 4 MG/2ML IJ SOLN: 4.0000 mg | Freq: Four times a day (QID) | INTRAMUSCULAR | Status: DC | PRN

## 2013-04-27 MED ORDER — MIDAZOLAM HCL 2 MG/2ML IJ SOLN
INTRAMUSCULAR | Status: AC
  Filled 2013-04-27: qty 6

## 2013-04-27 MED ORDER — ACETAMINOPHEN 500 MG PO TABS
500.0000 mg | ORAL_TABLET | Freq: Four times a day (QID) | ORAL | Status: AC | PRN
  Administered 2013-04-27: 500 mg via ORAL
  Filled 2013-04-27: qty 1

## 2013-04-27 MED ORDER — PIPERACILLIN-TAZOBACTAM 3.375 G IVPB
3.3750 g | Freq: Once | INTRAVENOUS | Status: AC
  Administered 2013-04-27: 3.375 g via INTRAVENOUS
  Filled 2013-04-27: qty 50

## 2013-04-27 MED ORDER — MIDAZOLAM HCL 2 MG/2ML IJ SOLN
INTRAMUSCULAR | Status: AC | PRN
  Administered 2013-04-27: 2 mg via INTRAVENOUS
  Administered 2013-04-27 (×4): 1 mg via INTRAVENOUS

## 2013-04-27 MED ORDER — DIPHENHYDRAMINE HCL 50 MG/ML IJ SOLN: 12.5000 mg | Freq: Four times a day (QID) | INTRAMUSCULAR | Status: DC | PRN

## 2013-04-27 NOTE — Progress Notes (Signed)
Pt c/o very cold, chills and shaking.  Pt's pain is 7/10.  Temp 97.6, 117/96, 100% O2  And HR 112.  Dr. Pascal Lux notified and saline bolus ordered.

## 2013-04-27 NOTE — Progress Notes (Signed)
  Subjective: Pt doing better since arrival to floor; had episode of rigors postprocedure; currently denies N/V; abd pain level around baseline 5/10  Objective: Vital signs in last 24 hours: Temp:  [96.9 F (36.1 C)-99.5 F (37.5 C)] 98 F (36.7 C) (02/27 1517) Pulse Rate:  [86-130] 103 (02/27 1500) Resp:  [7-18] 14 (02/27 1636) BP: (88-170)/(64-100) 92/64 mmHg (02/27 1500) SpO2:  [97 %-100 %] 98 % (02/27 1636)    Intake/Output from previous day:   Intake/Output this shift: Total I/O In: 124 [P.O.:120; I.V.:4] Out: -   Biliary drains intact, insertion sites clean and dry, mild-mod tender esp at rt hepatic drain; green bile in bags; abd soft  Lab Results:   Recent Labs  04/27/13 0810  WBC 5.7  HGB 9.5*  HCT 28.4*  PLT 265   BMET  Recent Labs  04/27/13 0810  NA 135*  K 3.5*  CL 100  CO2 21  GLUCOSE 89  BUN 10  CREATININE 0.56  CALCIUM 8.3*   PT/INR  Recent Labs  04/27/13 0810  LABPROT 16.5*  INR 1.37   ABG No results found for this basename: PHART, PCO2, PO2, HCO3,  in the last 72 hours  Studies/Results: No results found.  Anti-infectives: Anti-infectives   Start     Dose/Rate Route Frequency Ordered Stop   04/27/13 1800  piperacillin-tazobactam (ZOSYN) IVPB 3.375 g     3.375 g 12.5 mL/hr over 240 Minutes Intravenous Every 8 hours 04/27/13 1455     04/27/13 0745  piperacillin-tazobactam (ZOSYN) IVPB 3.375 g     3.375 g 100 mL/hr over 30 Minutes Intravenous  Once 04/27/13 3500 04/27/13 0957      Assessment/Plan: s/p exchange of existing left biliary I/E drain, placement of new rt I/E biliary drain 2/27; for overnight obs for pain control/hemodynamic monitoring;IV hydration, cont IV zosyn every 8 hrs; tid flushes to biliary drains; may resume xarelto on 2/28 if stable; check am labs; will need f/u cholangiograms/cath exchanges/poss stenting in 6 weeks as OP  LOS: 0 days    Fabrizzio Marcella,D [facility] 04/27/2013

## 2013-04-27 NOTE — Procedures (Signed)
Successful placement of a right anterior inferior approach transhepatic10 Fr biliary drainage catheter with end coiled and locked within the duodenum.  Successful exchange of new left sided 10 Fr PBD with end coiled and locked in duodenum. Both biliary drainage catheters connected to gravity bags. No immediate post procedural complications.

## 2013-04-27 NOTE — Progress Notes (Signed)
Dr. Pascal Lux at pt's bedside.

## 2013-04-27 NOTE — H&P (Signed)
Chief Complaint: "I am here to have a drain placed in my liver." Referring Physician: Dr. Humphrey Rolls HPI: [name] is an 63 y.o. female with history of metastatic cervical cancer with obstructive jaundice, bilirubin continues to be elevated even after s/p PTC 60F internal/external drain placed 04/09/13. The patient had a cholangiogram 04/23/13 which revealed draining of left hepatic lobe only. The patient is still jaundice and states the last 3 days she has noticed low grade temperatures in the evening of 100F. She denies any abdominal pain, chest pain or shortness of breath. She states her existing drain is draining without any difficulty, leaking or bleeding. She states she tolerated sedation well with previous procedure and denies any history of sleep apnea or chronic oxygen use. She is on xarelto for atrial fibrillation and states her last dose was Wednesday 04/25/13. She denies any active bleeding. She denies any current fever or chills.   Past Medical History:  Past Medical History  Diagnosis Date  . Diastolic dysfunction   . MIGRAINE HEADACHE   . Atrial fibrillation     failed DCCV, CHADs2-prev pradaxa stopped due to hematuria with ureter issues/JJ   . Anemia   . HTN (hypertension)   . GLAUCOMA   . CARPAL TUNNEL SYNDROME, RIGHT   . Lymphedema     L>R leg from pelvic XRT/scarring  . Hepatic steatosis     CT scan 06/2009, 12/2009  . DVT (deep venous thrombosis) 10/2010    LLE, anticoag resumed  . CHF (congestive heart failure)     1 yr ago per pt  . Arthritis   . GERD (gastroesophageal reflux disease)   . Small kidney     left  . Radiation 10/11/11-11/19/11    5040 cGy in 28 fx's/periaortic  . Radiation 03/16/10-03/25/10    right inguinal node/left external iliac node  . Radiation 06/11/08-07/23/08    6000 cGy left pelvis  . Hyperlipidemia   . Leg swelling     left  . Cervical cancer dx 04/2003, recur 04/2008    s/p debulk (TAHBSO), chemo/XRT; recurrent dz L pelvis dx 2010 and liver met  09/2010 s/p RFA  . Osteoporosis with fracture 2014    compression fx, s/p KP ; started prolia 06/2012- narcotic pain mgmt    Past Surgical History:  Past Surgical History  Procedure Laterality Date  . Ureteral stent placement  2005, 01/2010, 07/2010    L hydro related to cervical ca and XRT  . Oophorectomy  2005  . Cardioversion    . Tonsillectomy    . Cholecystectomy  1984  . Total abdominal hysterectomy  2005  . Carpal tunnel  2009    right  . Ivc filter  04/2011    Family History:  Family History  Problem Relation Age of Onset  . Lung cancer    . Hypertension    . Heart disease    . Stroke    . Asthma    . Asthma Son   . Cancer Mother     lung  . Stroke Father     Social History:  reports that she quit smoking about 29 years ago. She has never used smokeless tobacco. She reports that she does not drink alcohol or use illicit drugs.  Allergies:  Allergies  Allergen Reactions  . Codeine Other (See Comments)    Breathing issues  . Flecainide Acetate Other (See Comments)    Leg cramping   . Morphine Other (See Comments)     Hallucinations  .  Propafenone Hcl Other (See Comments)    Leg cramps    Medications: Xarelto, Celexa, cardizem, folvite, dilaudid, Dulcolax, zyrtec, Emla cream, ativan, lopressor, zofran, ambien, potassium chloride, protonix See full med reconciliation.   Please HPI for pertinent positives, otherwise complete 10 system ROS negative.  Physical Exam: BP 110/71  Pulse 93  Temp(Src) 96.9 F (36.1 C) (Oral)  Resp 18  SpO2 100% There is no weight on file to calculate BMI.  General Appearance:  Alert, cooperative, no distress, jaundice  Head:  Normocephalic, without obvious abnormality, atraumatic  Neck: Supple, symmetrical, trachea midline  Lungs:   Clear to auscultation bilaterally, no w/r/r, respirations unlabored without use of accessory muscles.  Chest Wall:  No tenderness or deformity. Port-a-catheter intact right chest wall.   Heart:   Irregularly irregular rate and rhythm, S1, S2 normal, no murmur, rub or gallop.  Abdomen:   Soft, non-tender, non distended, (+) BS, transhepatic biliary drain intact, dressing C/D/I, bile in gravity bag.  Extremities: Extremities normal, atraumatic, no cyanosis or edema  Pulses: 1+ and symmetric  Neurologic: Normal affect, no gross deficits.   Results for orders placed during the hospital encounter of 04/27/13 (from the past 48 hour(s))  APTT     Status: Abnormal   Collection Time    04/27/13  8:10 AM      Result Value Ref Range   aPTT 47 (*) 24 - 37 seconds   Comment:            IF BASELINE aPTT IS ELEVATED,     SUGGEST PATIENT RISK ASSESSMENT     BE USED TO DETERMINE APPROPRIATE     ANTICOAGULANT THERAPY.  CBC WITH DIFFERENTIAL     Status: Abnormal   Collection Time    04/27/13  8:10 AM      Result Value Ref Range   WBC 5.7  4.0 - 10.5 K/uL   RBC 3.04 (*) 3.87 - 5.11 MIL/uL   Hemoglobin 9.5 (*) 12.0 - 15.0 g/dL   HCT 28.4 (*) 36.0 - 46.0 %   MCV 93.4  78.0 - 100.0 fL   MCH 31.3  26.0 - 34.0 pg   MCHC 33.5  30.0 - 36.0 g/dL   RDW 14.2  11.5 - 15.5 %   Platelets 265  150 - 400 K/uL   Neutrophils Relative % 73  43 - 77 %   Neutro Abs 4.2  1.7 - 7.7 K/uL   Lymphocytes Relative 12  12 - 46 %   Lymphs Abs 0.7  0.7 - 4.0 K/uL   Monocytes Relative 13 (*) 3 - 12 %   Monocytes Absolute 0.8  0.1 - 1.0 K/uL   Eosinophils Relative 2  0 - 5 %   Eosinophils Absolute 0.1  0.0 - 0.7 K/uL   Basophils Relative 0  0 - 1 %   Basophils Absolute 0.0  0.0 - 0.1 K/uL  COMPREHENSIVE METABOLIC PANEL     Status: Abnormal   Collection Time    04/27/13  8:10 AM      Result Value Ref Range   Sodium 135 (*) 137 - 147 mEq/L   Potassium 3.5 (*) 3.7 - 5.3 mEq/L   Chloride 100  96 - 112 mEq/L   CO2 21  19 - 32 mEq/L   Glucose, Bld 89  70 - 99 mg/dL   BUN 10  6 - 23 mg/dL   Creatinine, Ser 0.56  0.50 - 1.10 mg/dL   Calcium 8.3 (*) 8.4 - 10.5  mg/dL   Total Protein 6.3  6.0 - 8.3 g/dL   Albumin 2.1  (*) 3.5 - 5.2 g/dL   AST 42 (*) 0 - 37 U/L   ALT 47 (*) 0 - 35 U/L   Alkaline Phosphatase 647 (*) 39 - 117 U/L   Total Bilirubin 6.5 (*) 0.3 - 1.2 mg/dL   GFR calc non Af Amer >90  >90 mL/min   GFR calc Af Amer >90  >90 mL/min   Comment: (NOTE)     The eGFR has been calculated using the CKD EPI equation.     This calculation has not been validated in all clinical situations.     eGFR's persistently <90 mL/min signify possible Chronic Kidney     Disease.  PROTIME-INR     Status: Abnormal   Collection Time    04/27/13  8:10 AM      Result Value Ref Range   Prothrombin Time 16.5 (*) 11.6 - 15.2 seconds   INR 1.37  0.00 - 1.49   No results found.  Assessment/Plan Metastatic Cervical Cancer with obstructive jaundice, Bilirubin 6.5 today. S/p PTC internal/external biliary drain placed 04/09/13 Cholangiogram 04/23/13 revealed draining of left hepatic lobe only. Scheduled today for image guided percutaneous transhepatic right biliary drain placement. Atrial fibrillation, rate controlled, xarelto held.  Anemia, labs reviewed. Patient has been NPO, xarelto held 24 hours, labs and images reviewed.  Risks and Benefits discussed with the patient and her family. All of the patient's questions were answered, patient is agreeable to proceed. Consent signed and in chart. Patient to be admitted overnight for observation.    Tsosie Billing D PA-C 04/27/2013, 8:50 AM

## 2013-04-27 NOTE — Progress Notes (Signed)
Both biliary drains emptied bile fluid totalled 146mls.   Report given to [facility].  Pt transported to 1528 via bed.Pt tolerated well.

## 2013-04-27 NOTE — Telephone Encounter (Signed)
, °

## 2013-04-27 NOTE — Progress Notes (Signed)
Pt is noted to have two biliary drains.  Mid abdomen and Left side drains noted.  Dressings are clean dry and intact.

## 2013-04-28 DIAGNOSIS — K831 Obstruction of bile duct: Secondary | ICD-10-CM | POA: Diagnosis not present

## 2013-04-28 LAB — COMPREHENSIVE METABOLIC PANEL
ALT: 42 U/L — ABNORMAL HIGH (ref 0–35)
AST: 37 U/L (ref 0–37)
Albumin: 1.8 g/dL — ABNORMAL LOW (ref 3.5–5.2)
Alkaline Phosphatase: 541 U/L — ABNORMAL HIGH (ref 39–117)
BUN: 5 mg/dL — AB (ref 6–23)
CO2: 21 meq/L (ref 19–32)
Calcium: 7.4 mg/dL — ABNORMAL LOW (ref 8.4–10.5)
Chloride: 102 mEq/L (ref 96–112)
Creatinine, Ser: 0.57 mg/dL (ref 0.50–1.10)
GFR calc Af Amer: >90 mL/min (ref >90)
GLUCOSE: 89 mg/dL (ref 70–99)
POTASSIUM: 3 meq/L — AB (ref 3.7–5.3)
Sodium: 134 mEq/L — ABNORMAL LOW (ref 137–147)
TOTAL PROTEIN: 5.7 g/dL — AB (ref 6.0–8.3)
Total Bilirubin: 5.1 mg/dL — ABNORMAL HIGH (ref 0.3–1.2)

## 2013-04-28 LAB — CBC
HEMATOCRIT: 27.6 % — AB (ref 36.0–46.0)
Hemoglobin: 9 g/dL — ABNORMAL LOW (ref 12.0–15.0)
MCH: 30.8 pg (ref 26.0–34.0)
MCHC: 32.6 g/dL (ref 30.0–36.0)
MCV: 94.5 fL (ref 78.0–100.0)
PLATELETS: 233 10*3/uL (ref 150–400)
RBC: 2.92 MIL/uL — ABNORMAL LOW (ref 3.87–5.11)
RDW: 14.2 % (ref 11.5–15.5)
WBC: 6.5 10*3/uL (ref 4.0–10.5)

## 2013-04-28 MED ORDER — FENTANYL 100 MCG/HR TD PT72
200.0000 ug | MEDICATED_PATCH | TRANSDERMAL | Status: DC
  Administered 2013-04-28: 200 ug via TRANSDERMAL
  Filled 2013-04-28: qty 2

## 2013-04-28 MED ORDER — RIVAROXABAN 20 MG PO TABS
20.0000 mg | ORAL_TABLET | Freq: Every day | ORAL | Status: DC
  Filled 2013-04-28: qty 1

## 2013-04-28 MED ORDER — SODIUM CHLORIDE 0.9 % IJ SOLN
10.0000 mL | INTRAMUSCULAR | Status: DC | PRN
  Administered 2013-04-28 (×2): 10 mL

## 2013-04-28 MED ORDER — DILTIAZEM HCL ER COATED BEADS 180 MG PO CP24
180.0000 mg | ORAL_CAPSULE | Freq: Every day | ORAL | Status: DC
  Administered 2013-04-28: 180 mg via ORAL
  Filled 2013-04-28: qty 1

## 2013-04-28 MED ORDER — METOPROLOL TARTRATE 50 MG PO TABS
75.0000 mg | ORAL_TABLET | Freq: Two times a day (BID) | ORAL | Status: DC
  Administered 2013-04-28: 75 mg via ORAL
  Filled 2013-04-28 (×2): qty 1

## 2013-04-28 MED ORDER — POTASSIUM CHLORIDE CRYS ER 20 MEQ PO TBCR
40.0000 meq | EXTENDED_RELEASE_TABLET | Freq: Once | ORAL | Status: AC
  Administered 2013-04-28: 40 meq via ORAL
  Filled 2013-04-28: qty 2

## 2013-04-28 MED ORDER — HYDROMORPHONE HCL 2 MG PO TABS
4.0000 mg | ORAL_TABLET | ORAL | Status: DC | PRN
  Administered 2013-04-28 (×2): 4 mg via ORAL
  Filled 2013-04-28 (×2): qty 2

## 2013-04-28 MED ORDER — HEPARIN SOD (PORK) LOCK FLUSH 100 UNIT/ML IV SOLN
500.0000 [IU] | INTRAVENOUS | Status: AC | PRN
  Administered 2013-04-28: 500 [IU]

## 2013-04-28 NOTE — Discharge Summary (Signed)
Physician Discharge Summary  Patient ID: [name] MRN: [id number] DOB/AGE: [id number] y.o.  Admit date: 04/27/2013 Discharge date: 04/28/2013  Admission Diagnoses: Active Problems:   Malignant obstructive jaundice  Discharge Diagnoses:  Active Problems:   Malignant obstructive jaundice    Procedures: 2/27--IMAGE GUIDED PLACEMENT OF NEW RIGHT INTERNAL/EXTENRAL BILIARY DRAIN AND EXCHANGE OF LEFT INTERNAL/EXTERNAL BILIARY DRAIN  Discharged Condition: good  Hospital Course: HPI: [name] is an 63 y.o. female with history of metastatic cervical cancer with obstructive jaundice, bilirubin continues to be elevated even after s/p PTC 68F internal/external drain placed 04/09/13. The patient had a cholangiogram 04/23/13 which revealed draining of left hepatic lobe only. The patient is still jaundice and states the last 3 days she has noticed low grade temperatures in the evening of 100F. She denies any abdominal pain, chest pain or shortness of breath. She states her existing drain is draining without any difficulty, leaking or bleeding. She states she tolerated sedation well with previous procedure and denies any history of sleep apnea or chronic oxygen use. She is on xarelto for atrial fibrillation and states her last dose was Wednesday 04/25/13. She denies any active bleeding. She denies any current fever or chills      She was brought to IR suite on 2/27 and underwent successful placement of new right sided int/ext biliary drain as well as exchange of left sided biliary drain. She tolerated the procedure well with no immediate complications. She was then admitted to the floor for observation. She did develop some post procedure rigors. These subsided after some Demerol and Tylenol was given,. She continued to have significant pain and was started on a PCA pump.      On POD #1, she felt much better, though continues to have some pain. Her PCA was discontinued and she was started back on her  home pain medications.  Her other home meds including those for HTN and Atrial fibrillation were also restarted, including Xarelto. Her serum Potassium was low and this was repleted with a one time dose of of K-Dur.    She is determined to be stable for discharge home. Her medications, restrictions, and follow up instructions were all reviewed.  Consults: None   Discharge Exam: Blood pressure 107/78, pulse 91, temperature 97.8 F (36.6 C), temperature source Oral, resp. rate 18, height 5\' 1"  (1.549 m), weight 158 lb (71.668 kg), SpO2 99.00%.  Heart: Tachy irreg  Lungs: CTA  Abd: soft. (R)drain intact, site clean, dry. Tender.  (L)drain intact, clean, dry, nontender  Skin: remains jaundiced   Disposition: 01-Home or Self Care      Discharge Orders   Future Appointments Provider Department Dept Phone   06/08/2013 2:30 PM [facility] [phone]   Future Orders Complete By Expires   Call MD for:  difficulty breathing, headache or visual disturbances  As directed    Call MD for:  persistant nausea and vomiting  As directed    Call MD for:  redness, tenderness, or signs of infection (pain, swelling, redness, odor or green/yellow discharge around incision site)  As directed    Call MD for:  severe uncontrolled pain  As directed    Call MD for:  temperature >100.4  As directed    Change dressing (specify)  As directed    Comments:     Clean dressings around drain sites. Change as needed or at least every other day.   Diet - low sodium heart  healthy  As directed    Increase activity slowly  As directed    May shower / Bathe  As directed    Comments:     Resume previous bathing instructions. Keeping drains/dressings as dry as possible       Medication List         bisacodyl 5 MG EC tablet  Commonly known as:  DULCOLAX  Take 5 mg by mouth daily as needed for constipation.     CARNATION INSTANT BREAKFAST PO  Take 1 Can  by mouth 4 (four) times daily as needed (for meals).     cetirizine 10 MG tablet  Commonly known as:  ZYRTEC  Take 10 mg by mouth daily as needed for allergies.     citalopram 20 MG tablet  Commonly known as:  CELEXA  Take 20 mg by mouth 2 (two) times daily.     diltiazem 180 MG 24 hr capsule  Commonly known as:  CARDIZEM CD  Take 1 capsule (180 mg total) by mouth daily. TAke 2-3 hours before of after Metoprolol     docusate sodium 100 MG capsule  Commonly known as:  COLACE  Take 1 capsule (100 mg total) by mouth 2 (two) times daily.     fentaNYL 100 MCG/HR  Commonly known as:  DURAGESIC - dosed mcg/hr  Apply 2 patches every 72 hours (total of 200 mcg)     ferrous fumarate 325 (106 FE) MG Tabs tablet  Commonly known as:  HEMOCYTE - 106 mg FE  Take 1 tablet (106 mg of iron total) by mouth daily.     folic acid 1 MG tablet  Commonly known as:  FOLVITE  Take 1 tablet (1 mg total) by mouth every morning.     HYDROmorphone 4 MG tablet  Commonly known as:  DILAUDID  Take 1 tablet every 4 hours as needed for pain     ibuprofen 200 MG tablet  Commonly known as:  ADVIL,MOTRIN  Take 600 mg by mouth every 4 (four) hours as needed. Pain     lidocaine-prilocaine cream  Commonly known as:  EMLA  Apply 1 application topically daily as needed (for port access).     LORazepam 1 MG tablet  Commonly known as:  ATIVAN  Take 1 tablet (1 mg total) by mouth every 6 (six) hours as needed (for nausea (may put under tongue)).     metoprolol tartrate 25 MG tablet  Commonly known as:  LOPRESSOR  Take 3 tablets (75 mg total) by mouth 2 (two) times daily.     ondansetron 8 MG tablet  Commonly known as:  ZOFRAN  Take 1 tablet (8 mg total) by mouth every 8 (eight) hours as needed for nausea.     pantoprazole 40 MG tablet  Commonly known as:  PROTONIX  Take 1 tablet (40 mg total) by mouth daily.     potassium chloride 8 MEQ tablet  Commonly known as:  KLOR-CON  Take 8 mEq by mouth daily.      protein supplement Powd  Commonly known as:  UNJURY CHICKEN SOUP  Take 2 oz by mouth 4 (four) times daily as needed (for meals).     Rivaroxaban 20 MG Tabs tablet  Commonly known as:  XARELTO  Take 1 tablet (20 mg total) by mouth every evening.     sodium chloride 0.65 % nasal spray  Commonly known as:  OCEAN  Place 1 spray into the nose daily as needed for congestion. Allergies  triamcinolone 55 MCG/ACT nasal inhaler  Commonly known as:  NASACORT  Place 2 sprays into the nose daily as needed (allergies or congestion).     zolpidem 5 MG tablet  Commonly known as:  AMBIEN  Take 1 tablet (5 mg total) by mouth at bedtime as needed for sleep.         SignedAscencion Dike PA-C 04/28/2013, 1:46 PM

## 2013-04-28 NOTE — Progress Notes (Signed)
Subjective: Pt s/p (R)sided biliary drain. Feeling some better since yesterday. Still with significant pain, using PCA No N/V   Objective: Physical Exam: BP 115/73  Pulse 120  Temp(Src) 99 F (37.2 C) (Oral)  Resp 18  Ht _0  (1.549 m)  Wt 158 lb (71.668 kg)  BMI 29.87 kg/m2  SpO2 95% Heart: Tachy irreg Lungs: CTA Abd: soft. (R)drain intact, site clean, dry. Tender. (L)drain intact, clean, dry, nontender Skin: remains jaundiced   Labs: CBC  Recent Labs  04/27/13 0810 04/28/13 0455  WBC 5.7 6.5  HGB 9.5* 9.0*  HCT 28.4* 27.6*  PLT 265 233   BMET  Recent Labs  04/27/13 0810 04/28/13 0455  NA 135* 134*  K 3.5* 3.0*  CL 100 102  CO2 21 21  GLUCOSE 89 89  BUN 10 5*  CREATININE 0.56 0.57  CALCIUM 8.3* 7.4*   LFT  Recent Labs  04/28/13 0455  PROT 5.7*  ALBUMIN 1.8*  AST 37  ALT 42*  ALKPHOS 541*  BILITOT 5.1*   PT/INR  Recent Labs  04/27/13 0810  LABPROT 16.5*  INR 1.37     Studies/Results: Ir Biliary Dilitation  04/27/2013   INDICATION: History of metastatic cervical cancer, now with obstructive jaundice due to hilar metastases. Post left-sided ultrasound and fluoroscopic guided percutaneous biliary drainage catheter placement on 04/09/2013, though patient's bilirubin has remained elevated post drain placement. Subsequent biliary catheter check performed 04/23/2013 demonstrates failure of communication between the left and right biliary trees. As such, the request has been made for placement of an additional right-sided biliary drainage catheter.  EXAM: 1. ULTRASOUND AND FLUOROSCOPIC GUIDED PERCUTANEOUS TRANSHEPATIC CHOLANGIOGRAM AND BILIARY TUBE PLACEMENT 2. FLUOROSCOPIC GUIDED EXCHANGE OF LEFT-SIDED PERCUTANEOUS BILIARY DRAINAGE CATHETER  MEDICATIONS: Versed said 6 mg IV; fentanyl 300 mcg IV; Zosyn 3.375 mg IV  TECHNIQUE: Informed written consent was obtained from the patient after a discussion of the risks, benefits and alternatives to  treatment. Questions regarding the procedure were encouraged and answered. A timeout was performed prior to the initiation of the procedure.  The right upper abdominal quadrant and external portion of the existing left biliary drainage catheter was prepped and draped in the usual sterile fashion, and a sterile drape was applied covering the operative field. Maximum barrier sterile technique with sterile gowns and gloves were used for the procedure. A timeout was performed prior to the initiation of the procedure.  Limited contrast injection was performed of the existing left-sided biliary drainage catheter however again failed to delineate communication within the right biliary tree.  Ultrasound scanning of the right upper abdominal quadrant was performed to delineate the anatomy and avoid transgression of the gallbladder or the pleural. A spot along the right mid axillary line was marked fluoroscopically inferior to the right costophrenic angle.  After the overlying soft tissues were anesthetized with 1% Lidocaine with epinephrine, a 22 gauge Chiba needle was utilized a moderately dilated biliary duct within the anterior inferior segment of the right lobe of the liver. Intra biliary puncture was confirmed with the injection of a small amount of contrast. The duct was cannulated with a Nitrex wire and the tract was dilated with an Accustick set.  The Nitrex wire was advanced to the level of the duodenum. A 4 French angled glide catheter was cut and utilized to exchange the Nitrex wire for an Amplatz stiff guidewire which was coiled within the proximal duodenum. The Accustick and 4 French angled glide catheter were exchanged for a Kumpe catheter. Contrast  injection confirmed appropriate positioning.  Over an Amplatz stiff wire, the tract was dilated and a 10.2 [place] biliary drainage catheter was advanced with coil ultimately locked within the duodenum. Note, approximately 2 cm of sideholes were made peripheral to  the radiopaque marker. Contrast was injected and a completion radiograph was obtained. The catheter was connected to a drainage bag which yielded the brisk return of clear bile. The catheter was secured to the skin with an interrupted suture.  The existing left biliary drainage catheter was cut and cannulated with an Amplatz wire which was coiled within the proximal duodenum. Under intermittent fluoroscopic guidance, the existing 10 French percutaneous drainage catheter was exchanged for a new 10 French percutaneous drainage catheter with end ultimately coiled and locked within the duodenum. Note, approximately 2 cm of sideholes were made peripheral to the radiopaque marker. Limited contrast injection was performed in a postprocedural spot radiograph was obtained.  The patient tolerated the procedure well without immediate postprocedural complication.  ANESTHESIA/SEDATION: Total Moderate Sedation Time  50 minutes  CONTRAST:  45m OMNIPAQUE IOHEXOL 300 MG/ML  SOLN  COMPARISON:  CT ABD/PELVIS W CM dated 04/05/2013  FLUOROSCOPY TIME:  6 minutes; 42 seconds.  COMPLICATIONS: Patient developed minimal postprocedural rigors which responded to conservative management including IV fluids, Demerol and Tylenol. Patient remained hemodynamically stable.  FINDINGS: Limited contrast injection of the existing left-sided biliary drainage catheter again failed to delineate communication between the left and right biliary trees.  Successful ultrasound and fluoroscopic guided placement of a 10 French percutaneous biliary drainage catheter via a moderately dilated peripheral duct within the anterior inferior aspect of the right lobe of the liver with an ultimately coiled and locked within the proximal duodenum. Note, approximately 2 cm of additional sideholes were caught peripheral to the radiopaque marker.  Successful fluoroscopic guided exchange of existing left-sided biliary approach percutaneous biliary drainage catheter with and  coiled and locked within the proximal duodenum. Note, approximately 2 cm of additional sideholes were cut peripheral to the radiopaque marker.  Limited contrast injection demonstrates an irregular mass within the confluence of the biliary hilum compatible with the known metastatic disease demonstrated on prior abdominal CT.  IMPRESSION: 1. Successful placement of a new 10.2 French percutaneous right-sided biliary drainage catheter with end coiled and locked within the duodenum. Note, approximately 2 cm of additional sideholes were cut peripheral to the radiopaque marker. 2. Successful fluoroscopic guided exchange of existing left-sided 10 French percutaneous biliary drainage catheter with end coiled and locked within the duodenum. Note, approximately 2 cm of additional sideholes were cut peripheral to the radiopaque marker.  PLAN: Patient will return for formal cholangiographic images in approximately 4-6 weeks at which time consideration for potential internal biliary stent placement may be considered. Note, given appearance of the hilar mass on limited cholangiogram performed today, I feel it is unlikely this patient will be a candidate for internal stent placement.   Electronically Signed   By: JSandi MariscalM.D.   On: 04/27/2013 17:06   Ir Catheter Tube Change  04/27/2013   INDICATION: History of metastatic cervical cancer, now with obstructive jaundice due to hilar metastases. Post left-sided ultrasound and fluoroscopic guided percutaneous biliary drainage catheter placement on 04/09/2013, though patient's bilirubin has remained elevated post drain placement. Subsequent biliary catheter check performed 04/23/2013 demonstrates failure of communication between the left and right biliary trees. As such, the request has been made for placement of an additional right-sided biliary drainage catheter.  EXAM: 1. ULTRASOUND AND FLUOROSCOPIC GUIDED PERCUTANEOUS  TRANSHEPATIC CHOLANGIOGRAM AND BILIARY TUBE PLACEMENT 2.  FLUOROSCOPIC GUIDED EXCHANGE OF LEFT-SIDED PERCUTANEOUS BILIARY DRAINAGE CATHETER  MEDICATIONS: Versed said 6 mg IV; fentanyl 300 mcg IV; Zosyn 3.375 mg IV  TECHNIQUE: Informed written consent was obtained from the patient after a discussion of the risks, benefits and alternatives to treatment. Questions regarding the procedure were encouraged and answered. A timeout was performed prior to the initiation of the procedure.  The right upper abdominal quadrant and external portion of the existing left biliary drainage catheter was prepped and draped in the usual sterile fashion, and a sterile drape was applied covering the operative field. Maximum barrier sterile technique with sterile gowns and gloves were used for the procedure. A timeout was performed prior to the initiation of the procedure.  Limited contrast injection was performed of the existing left-sided biliary drainage catheter however again failed to delineate communication within the right biliary tree.  Ultrasound scanning of the right upper abdominal quadrant was performed to delineate the anatomy and avoid transgression of the gallbladder or the pleural. A spot along the right mid axillary line was marked fluoroscopically inferior to the right costophrenic angle.  After the overlying soft tissues were anesthetized with 1% Lidocaine with epinephrine, a 22 gauge Chiba needle was utilized a moderately dilated biliary duct within the anterior inferior segment of the right lobe of the liver. Intra biliary puncture was confirmed with the injection of a small amount of contrast. The duct was cannulated with a Nitrex wire and the tract was dilated with an Accustick set.  The Nitrex wire was advanced to the level of the duodenum. A 4 French angled glide catheter was cut and utilized to exchange the Nitrex wire for an Amplatz stiff guidewire which was coiled within the proximal duodenum. The Accustick and 4 French angled glide catheter were exchanged for a Kumpe  catheter. Contrast injection confirmed appropriate positioning.  Over an Amplatz stiff wire, the tract was dilated and a 10.2 [place] biliary drainage catheter was advanced with coil ultimately locked within the duodenum. Note, approximately 2 cm of sideholes were made peripheral to the radiopaque marker. Contrast was injected and a completion radiograph was obtained. The catheter was connected to a drainage bag which yielded the brisk return of clear bile. The catheter was secured to the skin with an interrupted suture.  The existing left biliary drainage catheter was cut and cannulated with an Amplatz wire which was coiled within the proximal duodenum. Under intermittent fluoroscopic guidance, the existing 10 French percutaneous drainage catheter was exchanged for a new 10 French percutaneous drainage catheter with end ultimately coiled and locked within the duodenum. Note, approximately 2 cm of sideholes were made peripheral to the radiopaque marker. Limited contrast injection was performed in a postprocedural spot radiograph was obtained.  The patient tolerated the procedure well without immediate postprocedural complication.  ANESTHESIA/SEDATION: Total Moderate Sedation Time  50 minutes  CONTRAST:  54mL OMNIPAQUE IOHEXOL 300 MG/ML  SOLN  COMPARISON:  CT ABD/PELVIS W CM dated 04/05/2013  FLUOROSCOPY TIME:  6 minutes; 42 seconds.  COMPLICATIONS: Patient developed minimal postprocedural rigors which responded to conservative management including IV fluids, Demerol and Tylenol. Patient remained hemodynamically stable.  FINDINGS: Limited contrast injection of the existing left-sided biliary drainage catheter again failed to delineate communication between the left and right biliary trees.  Successful ultrasound and fluoroscopic guided placement of a 10 French percutaneous biliary drainage catheter via a moderately dilated peripheral duct within the anterior inferior aspect of the right lobe of  the liver with an  ultimately coiled and locked within the proximal duodenum. Note, approximately 2 cm of additional sideholes were caught peripheral to the radiopaque marker.  Successful fluoroscopic guided exchange of existing left-sided biliary approach percutaneous biliary drainage catheter with and coiled and locked within the proximal duodenum. Note, approximately 2 cm of additional sideholes were cut peripheral to the radiopaque marker.  Limited contrast injection demonstrates an irregular mass within the confluence of the biliary hilum compatible with the known metastatic disease demonstrated on prior abdominal CT.  IMPRESSION: 1. Successful placement of a new 10.2 French percutaneous right-sided biliary drainage catheter with end coiled and locked within the duodenum. Note, approximately 2 cm of additional sideholes were cut peripheral to the radiopaque marker. 2. Successful fluoroscopic guided exchange of existing left-sided 10 French percutaneous biliary drainage catheter with end coiled and locked within the duodenum. Note, approximately 2 cm of additional sideholes were cut peripheral to the radiopaque marker.  PLAN: Patient will return for formal cholangiographic images in approximately 4-6 weeks at which time consideration for potential internal biliary stent placement may be considered. Note, given appearance of the hilar mass on limited cholangiogram performed today, I feel it is unlikely this patient will be a candidate for internal stent placement.   Electronically Signed   By: Sandi Mariscal M.D.   On: 04/27/2013 17:06   Ir Ptc  04/27/2013   INDICATION: History of metastatic cervical cancer, now with obstructive jaundice due to hilar metastases. Post left-sided ultrasound and fluoroscopic guided percutaneous biliary drainage catheter placement on 04/09/2013, though patient's bilirubin has remained elevated post drain placement. Subsequent biliary catheter check performed 04/23/2013 demonstrates failure of  communication between the left and right biliary trees. As such, the request has been made for placement of an additional right-sided biliary drainage catheter.  EXAM: 1. ULTRASOUND AND FLUOROSCOPIC GUIDED PERCUTANEOUS TRANSHEPATIC CHOLANGIOGRAM AND BILIARY TUBE PLACEMENT 2. FLUOROSCOPIC GUIDED EXCHANGE OF LEFT-SIDED PERCUTANEOUS BILIARY DRAINAGE CATHETER  MEDICATIONS: Versed said 6 mg IV; fentanyl 300 mcg IV; Zosyn 3.375 mg IV  TECHNIQUE: Informed written consent was obtained from the patient after a discussion of the risks, benefits and alternatives to treatment. Questions regarding the procedure were encouraged and answered. A timeout was performed prior to the initiation of the procedure.  The right upper abdominal quadrant and external portion of the existing left biliary drainage catheter was prepped and draped in the usual sterile fashion, and a sterile drape was applied covering the operative field. Maximum barrier sterile technique with sterile gowns and gloves were used for the procedure. A timeout was performed prior to the initiation of the procedure.  Limited contrast injection was performed of the existing left-sided biliary drainage catheter however again failed to delineate communication within the right biliary tree.  Ultrasound scanning of the right upper abdominal quadrant was performed to delineate the anatomy and avoid transgression of the gallbladder or the pleural. A spot along the right mid axillary line was marked fluoroscopically inferior to the right costophrenic angle.  After the overlying soft tissues were anesthetized with 1% Lidocaine with epinephrine, a 22 gauge Chiba needle was utilized a moderately dilated biliary duct within the anterior inferior segment of the right lobe of the liver. Intra biliary puncture was confirmed with the injection of a small amount of contrast. The duct was cannulated with a Nitrex wire and the tract was dilated with an Accustick set.  The Nitrex wire was  advanced to the level of the duodenum. A 4 French angled glide  catheter was cut and utilized to exchange the Nitrex wire for an Amplatz stiff guidewire which was coiled within the proximal duodenum. The Accustick and 4 French angled glide catheter were exchanged for a Kumpe catheter. Contrast injection confirmed appropriate positioning.  Over an Amplatz stiff wire, the tract was dilated and a 10.2 [place] biliary drainage catheter was advanced with coil ultimately locked within the duodenum. Note, approximately 2 cm of sideholes were made peripheral to the radiopaque marker. Contrast was injected and a completion radiograph was obtained. The catheter was connected to a drainage bag which yielded the brisk return of clear bile. The catheter was secured to the skin with an interrupted suture.  The existing left biliary drainage catheter was cut and cannulated with an Amplatz wire which was coiled within the proximal duodenum. Under intermittent fluoroscopic guidance, the existing 10 French percutaneous drainage catheter was exchanged for a new 10 French percutaneous drainage catheter with end ultimately coiled and locked within the duodenum. Note, approximately 2 cm of sideholes were made peripheral to the radiopaque marker. Limited contrast injection was performed in a postprocedural spot radiograph was obtained.  The patient tolerated the procedure well without immediate postprocedural complication.  ANESTHESIA/SEDATION: Total Moderate Sedation Time  50 minutes  CONTRAST:  7m OMNIPAQUE IOHEXOL 300 MG/ML  SOLN  COMPARISON:  CT ABD/PELVIS W CM dated 04/05/2013  FLUOROSCOPY TIME:  6 minutes; 42 seconds.  COMPLICATIONS: Patient developed minimal postprocedural rigors which responded to conservative management including IV fluids, Demerol and Tylenol. Patient remained hemodynamically stable.  FINDINGS: Limited contrast injection of the existing left-sided biliary drainage catheter again failed to delineate communication  between the left and right biliary trees.  Successful ultrasound and fluoroscopic guided placement of a 10 French percutaneous biliary drainage catheter via a moderately dilated peripheral duct within the anterior inferior aspect of the right lobe of the liver with an ultimately coiled and locked within the proximal duodenum. Note, approximately 2 cm of additional sideholes were caught peripheral to the radiopaque marker.  Successful fluoroscopic guided exchange of existing left-sided biliary approach percutaneous biliary drainage catheter with and coiled and locked within the proximal duodenum. Note, approximately 2 cm of additional sideholes were cut peripheral to the radiopaque marker.  Limited contrast injection demonstrates an irregular mass within the confluence of the biliary hilum compatible with the known metastatic disease demonstrated on prior abdominal CT.  IMPRESSION: 1. Successful placement of a new 10.2 French percutaneous right-sided biliary drainage catheter with end coiled and locked within the duodenum. Note, approximately 2 cm of additional sideholes were cut peripheral to the radiopaque marker. 2. Successful fluoroscopic guided exchange of existing left-sided 10 French percutaneous biliary drainage catheter with end coiled and locked within the duodenum. Note, approximately 2 cm of additional sideholes were cut peripheral to the radiopaque marker.  PLAN: Patient will return for formal cholangiographic images in approximately 4-6 weeks at which time consideration for potential internal biliary stent placement may be considered. Note, given appearance of the hilar mass on limited cholangiogram performed today, I feel it is unlikely this patient will be a candidate for internal stent placement.   Electronically Signed   By: JSandi MariscalM.D.   On: 04/27/2013 17:06    Assessment/Plan: S/p (R)transhepatic biliary drain and exchange of (L)biliasry drain. Good function of both. Bili down to  5.1 Remains tachycardic, hx of Afib-restarted home BP/Afib meds Hgb stable at 9.0, no blood in bile, OK to restart Xarelto Hypokalemia-replete  Aim to wean off PCA later today.  Possible discharge later today if continues to do well.    LOS: 1 day    Ascencion Dike PA-C 04/28/2013 8:16 AM

## 2013-04-28 NOTE — Progress Notes (Signed)
Husband able to demonstrate how to flush drains and change dressings. Discharge instructions discussed with pt and husband. Able to answer questions about medications and when to call md.

## 2013-04-28 NOTE — Progress Notes (Signed)
Utilization Review Completed.   Aerica Rincon, RN, BSN Nurse Case Manager  

## 2013-04-30 ENCOUNTER — Other Ambulatory Visit (HOSPITAL_COMMUNITY): Payer: Self-pay | Admitting: Interventional Radiology

## 2013-04-30 ENCOUNTER — Other Ambulatory Visit: Payer: 59

## 2013-04-30 ENCOUNTER — Ambulatory Visit: Payer: 59

## 2013-04-30 DIAGNOSIS — K831 Obstruction of bile duct: Secondary | ICD-10-CM

## 2013-05-01 ENCOUNTER — Other Ambulatory Visit: Payer: Self-pay | Admitting: Adult Health

## 2013-05-01 ENCOUNTER — Telehealth: Payer: Self-pay | Admitting: Oncology

## 2013-05-01 ENCOUNTER — Ambulatory Visit (HOSPITAL_COMMUNITY)
Admission: RE | Admit: 2013-05-01 | Discharge: 2013-05-01 | Disposition: A | Discharge status: Home / Self Care | Payer: 59 | Source: Ambulatory Visit | Attending: Interventional Radiology | Admitting: Interventional Radiology

## 2013-05-01 DIAGNOSIS — Z4682 Encounter for fitting and adjustment of non-vascular catheter: Secondary | ICD-10-CM | POA: Diagnosis not present

## 2013-05-01 DIAGNOSIS — K838 Other specified diseases of biliary tract: Secondary | ICD-10-CM | POA: Insufficient documentation

## 2013-05-01 DIAGNOSIS — K831 Obstruction of bile duct: Secondary | ICD-10-CM

## 2013-05-01 NOTE — Telephone Encounter (Signed)
, °

## 2013-05-03 ENCOUNTER — Encounter: Payer: Self-pay | Admitting: Adult Health

## 2013-05-03 ENCOUNTER — Other Ambulatory Visit (HOSPITAL_COMMUNITY): Payer: Self-pay | Admitting: Interventional Radiology

## 2013-05-03 ENCOUNTER — Telehealth: Payer: Self-pay | Admitting: Oncology

## 2013-05-03 ENCOUNTER — Other Ambulatory Visit (HOSPITAL_BASED_OUTPATIENT_CLINIC_OR_DEPARTMENT_OTHER): Payer: 59

## 2013-05-03 ENCOUNTER — Ambulatory Visit (HOSPITAL_BASED_OUTPATIENT_CLINIC_OR_DEPARTMENT_OTHER): Payer: 59 | Admitting: Adult Health

## 2013-05-03 VITALS — BP 93/66 | HR 101 | Temp 97.7°F | Resp 18 | Ht 61.0 in | Wt 160.4 lb

## 2013-05-03 DIAGNOSIS — C539 Malignant neoplasm of cervix uteri, unspecified: Secondary | ICD-10-CM

## 2013-05-03 DIAGNOSIS — C787 Secondary malignant neoplasm of liver and intrahepatic bile duct: Secondary | ICD-10-CM

## 2013-05-03 DIAGNOSIS — F3289 Other specified depressive episodes: Secondary | ICD-10-CM

## 2013-05-03 DIAGNOSIS — C770 Secondary and unspecified malignant neoplasm of lymph nodes of head, face and neck: Secondary | ICD-10-CM

## 2013-05-03 DIAGNOSIS — G8929 Other chronic pain: Secondary | ICD-10-CM

## 2013-05-03 DIAGNOSIS — K5900 Constipation, unspecified: Secondary | ICD-10-CM

## 2013-05-03 DIAGNOSIS — K831 Obstruction of bile duct: Secondary | ICD-10-CM

## 2013-05-03 DIAGNOSIS — I82409 Acute embolism and thrombosis of unspecified deep veins of unspecified lower extremity: Secondary | ICD-10-CM

## 2013-05-03 DIAGNOSIS — F329 Major depressive disorder, single episode, unspecified: Secondary | ICD-10-CM

## 2013-05-03 LAB — COMPREHENSIVE METABOLIC PANEL (CC13)
ALK PHOS: 639 U/L — AB (ref 40–150)
ALT: 32 U/L (ref 0–55)
AST: 29 U/L (ref 5–34)
Albumin: 2.1 g/dL — ABNORMAL LOW (ref 3.5–5.0)
Anion Gap: 8 mEq/L (ref 3–11)
BUN: 11.5 mg/dL (ref 7.0–26.0)
CALCIUM: 8.8 mg/dL (ref 8.4–10.4)
CO2: 23 meq/L (ref 22–29)
Chloride: 105 mEq/L (ref 98–109)
Creatinine: 0.7 mg/dL (ref 0.6–1.1)
Glucose: 101 mg/dl (ref 70–140)
POTASSIUM: 3.6 meq/L (ref 3.5–5.1)
SODIUM: 136 meq/L (ref 136–145)
TOTAL PROTEIN: 6.5 g/dL (ref 6.4–8.3)
Total Bilirubin: 4.41 mg/dL — ABNORMAL HIGH (ref 0.20–1.20)

## 2013-05-03 LAB — CBC WITH DIFFERENTIAL/PLATELET
BASO%: 0.6 % (ref 0.0–2.0)
BASOS ABS: 0 10*3/uL (ref 0.0–0.1)
EOS%: 1.6 % (ref 0.0–7.0)
Eosinophils Absolute: 0.1 10*3/uL (ref 0.0–0.5)
HCT: 30.9 % — ABNORMAL LOW (ref 34.8–46.6)
HEMOGLOBIN: 10.2 g/dL — AB (ref 11.6–15.9)
LYMPH#: 0.6 10*3/uL — AB (ref 0.9–3.3)
LYMPH%: 9.2 % — ABNORMAL LOW (ref 14.0–49.7)
MCH: 30.6 pg (ref 25.1–34.0)
MCHC: 32.9 g/dL (ref 31.5–36.0)
MCV: 92.8 fL (ref 79.5–101.0)
MONO#: 0.8 10*3/uL (ref 0.1–0.9)
MONO%: 11.4 % (ref 0.0–14.0)
NEUT%: 77.2 % — ABNORMAL HIGH (ref 38.4–76.8)
NEUTROS ABS: 5.2 10*3/uL (ref 1.5–6.5)
Platelets: 332 10*3/uL (ref 145–400)
RBC: 3.33 10*6/uL — AB (ref 3.70–5.45)
RDW: 14.4 % (ref 11.2–14.5)
WBC: 6.7 10*3/uL (ref 3.9–10.3)

## 2013-05-03 NOTE — Progress Notes (Signed)
[place]  [phone] [phone]  OFFICE PROGRESS NOTE   ID: CHANIKA BYLAND   DOB: 24-Jul-1950  MR#: [id number]  DJM#:[id number]   PCP: Gwendolyn Grant, MD GYN ONC:  Threasa Heads, MD   DIAGNOSIS: [name] is a 63 y.o. female with metastatic cervical carcinoma.   CURRENT THERAPY: s/p 9 cycles of alimta  PRIOR THERAPY: #1 s/p radical hysterectomy with bilateral salpingo-oophorectomy and pelvic lymphadenectomy in March 2005 for a stage IB cervical cancer. Her final pathology showed no residual disease and all lymph nodes were negative.  #2 She was followed for 5 years with no evidence of recurrent disease.  She had a recurrence in the right pelvic sidewall in February 2010 which caused an obstruction of the right ureter. s/p radiation therapy and concurrent Cisplatinum chemotherapy.  #3 In late 2011, she was found to have recurrence of the right inguinal lymph node and left pelvic nodes.  She received additional radiation therapy to those areas. Radiation was completed in January 2012.   In April 2012 the patient had resolution of the right inguinal nodes.   #03 October 2010 the patient was found to have a liver lesion which measured 2.8 cm.  She received 6 cycles of Taxol with a partial response to chemotherapy with a liver lesion decreasing in size to 1.6 cm in 12/2010 PET scan.  PET scan also revealed that there were no other lesions except the liver.  Patient elected to ablate the liver lesion using RFA he performed on 05/07/2011.  The procedure went well but patient developed an extensive DVT in the left lower extremity.  She started receiving anticoagulant therapy with Lovenox and an IVC filter was placed.  #5 s/p radiation therapy in June, August and September 2013.   #05 January 2012, she was found to have metastasis in left inguinal lymph nodes. S/p low-dose chemotherapy consisting of Carboplatinum and Taxol in December and January  2014.  Chemotherapy course was complicated by neutropenia.  CT scans in February 2014  Showed an increase in size of the liver metastasis.   #7  s/p Alimta in May 2014 - 10/26/12 x 2 cycles.    #8 restaging scans on 01/16/2013: progresive disease in the pelvis and therefore restarted on single agent alimta with lower doses. Completed another 7 cycles.  #9 atrial fibrillation: Patient is on metoprolol and diltiazem  #10 bone health: She is receiving prolia every 6 months. Last dose administered November 2014  #11 recent restaging scans in January 2015 revealed progressive disease with subsequent deveopment of obstructive jaundice. She is s/p percutaneous biliary stent placment by IR.  INTERVAL HISTORY: [name] is a 63 y.o. 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  She had her bili drains revised by IR and her abdominal pain and bilirubin is much improved.  She returns to IR in 6 weeks for further revisions.  She is feeling much better since her revisions and has noticed a great improvement in her jaundice.  She would like further treatment to be considered since she is feeling so much better.  Otherwise, she denies fevers, chills, nausea, vomiting, constipation, diarrhea, numbness, or any other concerns.     MEDICAL HISTORY: Past Medical History  Diagnosis Date  . Diastolic dysfunction   . MIGRAINE HEADACHE   . Atrial fibrillation     failed DCCV, CHADs2-prev pradaxa stopped due to hematuria with ureter issues/JJ   . Anemia   . HTN (  hypertension)   . GLAUCOMA   . CARPAL TUNNEL SYNDROME, RIGHT   . Lymphedema     L>R leg from pelvic XRT/scarring  . Hepatic steatosis     CT scan 06/2009, 12/2009  . DVT (deep venous thrombosis) 10/2010    LLE, anticoag resumed  . CHF (congestive heart failure)     1 yr ago per pt  . Arthritis   . GERD (gastroesophageal reflux disease)   . Small kidney     left  . Radiation 10/11/11-11/19/11    5040 cGy in 28  fx's/periaortic  . Radiation 03/16/10-03/25/10    right inguinal node/left external iliac node  . Radiation 06/11/08-07/23/08    6000 cGy left pelvis  . Hyperlipidemia   . Leg swelling     left  . Cervical cancer dx 04/2003, recur 04/2008    s/p debulk (TAHBSO), chemo/XRT; recurrent dz L pelvis dx 2010 and liver met 09/2010 s/p RFA  . Osteoporosis with fracture 2014    compression fx, s/p KP ; started prolia 06/2012- narcotic pain mgmt    ALLERGIES:   Allergies  Allergen Reactions  . Codeine Other (See Comments)    Breathing issues  . Flecainide Acetate Other (See Comments)    Leg cramping   . Morphine Other (See Comments)     Hallucinations  . Propafenone Hcl Other (See Comments)    Leg cramps    MEDICATIONS:  No current facility-administered medications for this visit.   No current outpatient prescriptions on file.   Facility-Administered Medications Ordered in Other Visits  Medication Dose Route Frequency Provider Last Rate Last Dose  . 0.9 %  sodium chloride infusion   Intravenous Continuous Therisa Doyne, MD 100 mL/hr at 05/05/13 0752    . acetaminophen (TYLENOL) tablet 650 mg  650 mg Oral Q6H PRN Therisa Doyne, MD       Or  . acetaminophen (TYLENOL) suppository 650 mg  650 mg Rectal Q6H PRN Therisa Doyne, MD      . citalopram (CELEXA) tablet 20 mg  20 mg Oral BID Therisa Doyne, MD      . diltiazem (CARDIZEM CD) 24 hr capsule 180 mg  180 mg Oral Daily Therisa Doyne, MD      . docusate sodium (COLACE) capsule 100 mg  100 mg Oral BID Therisa Doyne, MD      . fentaNYL (DURAGESIC - dosed mcg/hr) 100 mcg  100 mcg Transdermal Q72H Therisa Doyne, MD      . HYDROmorphone (DILAUDID) injection 1 mg  1 mg Intravenous Q4H PRN Therisa Doyne, MD   1 mg at 05/05/13 0750  . HYDROmorphone (DILAUDID) tablet 4 mg  4 mg Oral Q4H PRN Therisa Doyne, MD      . loratadine (CLARITIN) tablet 10 mg  10 mg Oral Daily Therisa Doyne, MD      . LORazepam  (ATIVAN) tablet 1 mg  1 mg Oral Q6H PRN Therisa Doyne, MD      . metoprolol tartrate (LOPRESSOR) tablet 75 mg  75 mg Oral BID Therisa Doyne, MD      . ondansetron (ZOFRAN) tablet 4 mg  4 mg Oral Q6H PRN Therisa Doyne, MD       Or  . ondansetron (ZOFRAN) injection 4 mg  4 mg Intravenous Q6H PRN Therisa Doyne, MD      . pantoprazole (PROTONIX) EC tablet 40 mg  40 mg Oral Daily Anastassia Doutova, MD      . piperacillin-tazobactam (ZOSYN) IVPB 3.375 g  3.375 g  Intravenous 3 times per day Therisa Doyne, MD   3.375 g at 05/05/13 0534  . potassium chloride (K-DUR) CR tablet 10 mEq  10 mEq Oral BID Therisa Doyne, MD   10 mEq at 05/05/13 0025  . Rivaroxaban (XARELTO) tablet 20 mg  20 mg Oral QPM Anastassia Doutova, MD      . sodium chloride 0.9 % injection 3 mL  3 mL Intravenous Q12H Therisa Doyne, MD      . vancomycin (VANCOCIN) IVPB 1000 mg/200 mL premix  1,000 mg Intravenous Q12H Penny Pia, MD      . zolpidem (AMBIEN) tablet 5 mg  5 mg Oral QHS PRN Therisa Doyne, MD        SURGICAL HISTORY:  Past Surgical History  Procedure Laterality Date  . Ureteral stent placement  2005, 01/2010, 07/2010    L hydro related to cervical ca and XRT  . Oophorectomy  2005  . Cardioversion    . Tonsillectomy    . Cholecystectomy  1984  . Total abdominal hysterectomy  2005  . Carpal tunnel  2009    right  . Ivc filter  04/2011    REVIEW OF SYSTEMS:  A 10 point review of systems was conducted and pertinent symptoms as stated above.     PHYSICAL EXAMINATION: Blood pressure 93/66, pulse 101, temperature 97.7 F (36.5 C), temperature source Oral, resp. rate 18, height 5\' 1"  (1.549 m), weight 160 lb 6.4 oz (72.757 kg). Body mass index is 30.32 kg/(m^2). GENERAL: Patient is a well appearing female in no acute distress HEENT:  Sclerae- icteric   Oropharynx clear and moist. No ulcerations or evidence of oropharyngeal candidiasis. Neck is supple.  NODES:  No cervical,  supraclavicular, or axillary lymphadenopathy palpated.  BREAST EXAM:  Deferred. LUNGS:  Clear to auscultation bilaterally.  No wheezes or rhonchi. HEART: Irregular rate and rhythm. No murmur appreciated. ABDOMEN:  Soft, nontender.  Positive, normoactive bowel sounds. No organomegaly palpated.   MSK:  No focal spinal tenderness to palpation. Full range of motion bilaterally in the upper extremities. EXTREMITIES:  No peripheral edema.   SKIN:  Clear with no obvious rashes or skin changes. No nail dyscrasia.  Abdomen with two dressings from bili drains, they are clean dry and intact.  Drains are draining bilious fluid.   NEURO:  Nonfocal. Well oriented.  Appropriate affect. ECOG PERFORMANCE STATUS: 1 - Symptomatic but completely ambulatory  LABORATORY DATA: Lab Results  Component Value Date   WBC 6.2 05/05/2013   HGB 8.4* 05/05/2013   HCT 25.7* 05/05/2013   MCV 91.8 05/05/2013   PLT 237 05/05/2013      Chemistry      Component Value Date/Time   NA 130* 05/05/2013 0355   NA 136 05/03/2013 1501   K 3.2* 05/05/2013 0355   K 3.6 05/03/2013 1501   CL 97 05/05/2013 0355   CL 108* 08/15/2012 1252   CO2 22 05/05/2013 0355   CO2 23 05/03/2013 1501   BUN 10 05/05/2013 0355   BUN 11.5 05/03/2013 1501   CREATININE 0.58 05/05/2013 0355   CREATININE 0.7 05/03/2013 1501   CREATININE 1.45* 09/11/2010 1603      Component Value Date/Time   CALCIUM 7.9* 05/05/2013 0355   CALCIUM 8.8 05/03/2013 1501   ALKPHOS 484* 05/05/2013 0355   ALKPHOS 639* 05/03/2013 1501   AST 21 05/05/2013 0355   AST 29 05/03/2013 1501   ALT 24 05/05/2013 0355   ALT 32 05/03/2013 1501   BILITOT 4.0* 05/05/2013  0355   BILITOT 4.41* 05/03/2013 1501       RADIOGRAPHIC STUDIES: Ct Chest W Contrast 01/16/2013   CLINICAL DATA:  Metastatic cervical cancer  EXAM: CT CHEST, ABDOMEN, AND PELVIS WITH CONTRAST  TECHNIQUE: Multidetector CT imaging of the chest, abdomen and pelvis was performed following the standard protocol during bolus administration of intravenous contrast.   CONTRAST:  152mL OMNIPAQUE IOHEXOL 300 MG/ML  SOLN  COMPARISON:  10/13/2012  FINDINGS: CT CHEST FINDINGS  There is no pleural effusion identified. No airspace consolidation identified. Scattered areas of scarring versus platelike atelectasis identified in both upper lobes. No suspicious nodule or mass noted.  The trachea appears patent and is midline. No enlarged mediastinal or hilar lymph nodes identified. Mild cardiac enlargement. No pericardial effusion. Calcifications involving the thoracic aorta noted.  No axillary or supraclavicular adenopathy. Review of the visualized osseous structures is significant for mild multi level thoracic spondylosis. No aggressive lytic or sclerotic bone lesions identified.  CT ABDOMEN AND PELVIS FINDINGS  Multiple liver metastases are again identified. The index lesion within the dome of liver measures 3.2 cm, image 41/ series 2. Previously 3.4 cm. Central right hepatic lobe lesion measures 3.4 cm, image 45/series 2. This is compared with 4.3 cm previously. Posterior right hepatic lobe lesion measures 4.1 cm, image 53/ series 2. Previously 3.9 cm. Anterior right hepatic lobe lesion measures 6.9 cm, image 52/ series 2. Stable from previous exam. Slight increase an intrahepatic biliary dilatation. Prior cholecystectomy. The common bile duct is normal in caliber. Normal appearance of the pancreas. The spleen is unremarkable. Normal appearance of both adrenal glands. The right kidney is within normal limits. In stage left kidney is identified. Urinary bladder appears within normal limits. Previous hysterectomy.  Calcified atherosclerotic disease affects the abdominal aorta. Index portacaval lymph node is stable measuring 8 mm. Retrocaval lymph node measures 1 cm and is increased in size from previous exam, image 55/ series 2. Previously this measured 5 mm. Central low attenuation, peripherally enhancing lymph node within the left external iliac lymph node chain is stable measuring 2  cm. There is an area of increase soft tissue attenuation measuring 2.5 cm, image number 98/series 2.  The stomach appears normal in appearance. The small bowel loops have a normal course and caliber. No obstruction. Normal appearance of the colon.  Review of the visualized osseous structures is significant for compression fractures involving the L1 L2-L3 and L4 vertebra. The L1 and L3 vertebra have been treated with bone cement.  IMPRESSION: 1. No evidence for thoracic metastasis. 2. There are several liver lesions which are decreased in size from the previous exam. Other lesions are stable. 3. Slight increase in size of retrocaval lymph node within the upper abdomen. 4. Slight increase in abnormal, asymmetric soft tissue within the left external iliac region, suspicious for local metastasis.   Electronically Signed   By: Kerby Moors M.D.   On: 01/16/2013 09:06      ASSESSMENT/PLAN: AYLANIE CUBILLOS is a  63 y.o. woman with:  #1 Metastatic cervical carcinoma with liver metastasis. Patient is currently receiving alimta dose reduced.patient is status post 8 cycles of Alimta. now progressive disease. She has developed jaundice which is due to likely due to progressive disease. S/P percutaneous biliary drain placement.  We will arange with IR to internalize the drain, which has been started.  Her bilirubin and jaundice along with her pain are much improved.    2. Chronic pain: She will continue Fentanyl 277mcg and Dilaudid  $'4mg'x$  PO PRN.    #3Depression and anxiety - On Celexa.  #4 Poor appetite - using Carnation Instant Breakfast and protein supplement (Unjury chicken soup)  #5 DVT - On Xarelto  #6 Chronic left lower extremity lymphedema   #7 Constipation -  bowel regimen including increased hydration with water, consumption of fruits/vegetables/fiber, and taking Colace by mouth twice a day was recommended.  This is stable right now.    #8 atrial fibrillation: She is followed by cardiology  #9  The  patient will follow up with [place] in one week to discuss treatment plans for her cancer.        All questions were answered. Mrs. Dubinsky was encouraged to contact us in the interim with any questions, concerns, or problems.  I spent 15 minutes counseling the patient face to face.  The total time spent in the appointment was 30 minutes.  Minette Headland, [place] [phone]

## 2013-05-03 NOTE — Telephone Encounter (Signed)
, °

## 2013-05-03 NOTE — Progress Notes (Signed)
Pt returned for eval of biliary drain recently placed. She noted some leaking from site.  Both biliary drain sites look good. Small amount bilious staining at right site. Drain was irrigated with NS. Initially took a little extra force, which may have dislodged some debris. This also alleviated some pain she was having. The drain flushed quite easily after that.  A new bag was attached to each drain. We also reset the stat-lock to allow a little more slack.  She will call with further issues.   Ascencion Dike PA-C Interventional Radiology 05/03/2013 10:08 AM

## 2013-05-04 ENCOUNTER — Encounter (HOSPITAL_COMMUNITY): Payer: Self-pay | Admitting: Emergency Medicine

## 2013-05-04 ENCOUNTER — Emergency Department (HOSPITAL_COMMUNITY): Payer: 59

## 2013-05-04 ENCOUNTER — Inpatient Hospital Stay (HOSPITAL_COMMUNITY)
Admission: EM | Admit: 2013-05-04 | Discharge: 2013-05-15 | DRG: 871 | Disposition: A | Discharge status: Home Health | Payer: 59 | Attending: Internal Medicine | Admitting: Internal Medicine

## 2013-05-04 DIAGNOSIS — R112 Nausea with vomiting, unspecified: Secondary | ICD-10-CM

## 2013-05-04 DIAGNOSIS — E43 Unspecified severe protein-calorie malnutrition: Secondary | ICD-10-CM | POA: Diagnosis present

## 2013-05-04 DIAGNOSIS — R197 Diarrhea, unspecified: Secondary | ICD-10-CM

## 2013-05-04 DIAGNOSIS — M8080XA Other osteoporosis with current pathological fracture, unspecified site, initial encounter for fracture: Secondary | ICD-10-CM

## 2013-05-04 DIAGNOSIS — K7689 Other specified diseases of liver: Secondary | ICD-10-CM | POA: Diagnosis present

## 2013-05-04 DIAGNOSIS — Z86718 Personal history of other venous thrombosis and embolism: Secondary | ICD-10-CM

## 2013-05-04 DIAGNOSIS — K219 Gastro-esophageal reflux disease without esophagitis: Secondary | ICD-10-CM | POA: Diagnosis present

## 2013-05-04 DIAGNOSIS — I509 Heart failure, unspecified: Secondary | ICD-10-CM | POA: Diagnosis present

## 2013-05-04 DIAGNOSIS — I4891 Unspecified atrial fibrillation: Secondary | ICD-10-CM | POA: Diagnosis present

## 2013-05-04 DIAGNOSIS — I82509 Chronic embolism and thrombosis of unspecified deep veins of unspecified lower extremity: Secondary | ICD-10-CM | POA: Diagnosis present

## 2013-05-04 DIAGNOSIS — Z8249 Family history of ischemic heart disease and other diseases of the circulatory system: Secondary | ICD-10-CM

## 2013-05-04 DIAGNOSIS — Z825 Family history of asthma and other chronic lower respiratory diseases: Secondary | ICD-10-CM

## 2013-05-04 DIAGNOSIS — K8309 Other cholangitis: Secondary | ICD-10-CM | POA: Diagnosis present

## 2013-05-04 DIAGNOSIS — I10 Essential (primary) hypertension: Secondary | ICD-10-CM

## 2013-05-04 DIAGNOSIS — S32030A Wedge compression fracture of third lumbar vertebra, initial encounter for closed fracture: Secondary | ICD-10-CM

## 2013-05-04 DIAGNOSIS — Z6831 Body mass index (BMI) 31.0-31.9, adult: Secondary | ICD-10-CM | POA: Diagnosis not present

## 2013-05-04 DIAGNOSIS — C539 Malignant neoplasm of cervix uteri, unspecified: Secondary | ICD-10-CM | POA: Diagnosis present

## 2013-05-04 DIAGNOSIS — K831 Obstruction of bile duct: Secondary | ICD-10-CM | POA: Diagnosis present

## 2013-05-04 DIAGNOSIS — R6510 Systemic inflammatory response syndrome (SIRS) of non-infectious origin without acute organ dysfunction: Secondary | ICD-10-CM

## 2013-05-04 DIAGNOSIS — H409 Unspecified glaucoma: Secondary | ICD-10-CM | POA: Diagnosis present

## 2013-05-04 DIAGNOSIS — Z4682 Encounter for fitting and adjustment of non-vascular catheter: Secondary | ICD-10-CM

## 2013-05-04 DIAGNOSIS — G43909 Migraine, unspecified, not intractable, without status migrainosus: Secondary | ICD-10-CM | POA: Diagnosis present

## 2013-05-04 DIAGNOSIS — K750 Abscess of liver: Secondary | ICD-10-CM | POA: Diagnosis present

## 2013-05-04 DIAGNOSIS — Z801 Family history of malignant neoplasm of trachea, bronchus and lung: Secondary | ICD-10-CM

## 2013-05-04 DIAGNOSIS — E876 Hypokalemia: Secondary | ICD-10-CM

## 2013-05-04 DIAGNOSIS — I82409 Acute embolism and thrombosis of unspecified deep veins of unspecified lower extremity: Secondary | ICD-10-CM

## 2013-05-04 DIAGNOSIS — R7881 Bacteremia: Secondary | ICD-10-CM

## 2013-05-04 DIAGNOSIS — A419 Sepsis, unspecified organism: Secondary | ICD-10-CM | POA: Diagnosis present

## 2013-05-04 DIAGNOSIS — E871 Hypo-osmolality and hyponatremia: Secondary | ICD-10-CM | POA: Diagnosis present

## 2013-05-04 DIAGNOSIS — C787 Secondary malignant neoplasm of liver and intrahepatic bile duct: Secondary | ICD-10-CM | POA: Diagnosis present

## 2013-05-04 DIAGNOSIS — R748 Abnormal levels of other serum enzymes: Secondary | ICD-10-CM

## 2013-05-04 DIAGNOSIS — E860 Dehydration: Secondary | ICD-10-CM

## 2013-05-04 DIAGNOSIS — Z823 Family history of stroke: Secondary | ICD-10-CM

## 2013-05-04 DIAGNOSIS — Z79899 Other long term (current) drug therapy: Secondary | ICD-10-CM | POA: Diagnosis not present

## 2013-05-04 DIAGNOSIS — C801 Malignant (primary) neoplasm, unspecified: Secondary | ICD-10-CM | POA: Diagnosis present

## 2013-05-04 DIAGNOSIS — Z7901 Long term (current) use of anticoagulants: Secondary | ICD-10-CM | POA: Diagnosis not present

## 2013-05-04 DIAGNOSIS — D638 Anemia in other chronic diseases classified elsewhere: Secondary | ICD-10-CM | POA: Diagnosis present

## 2013-05-04 DIAGNOSIS — Z87891 Personal history of nicotine dependence: Secondary | ICD-10-CM | POA: Diagnosis not present

## 2013-05-04 DIAGNOSIS — A4150 Gram-negative sepsis, unspecified: Principal | ICD-10-CM | POA: Diagnosis present

## 2013-05-04 DIAGNOSIS — E785 Hyperlipidemia, unspecified: Secondary | ICD-10-CM | POA: Diagnosis present

## 2013-05-04 DIAGNOSIS — A4153 Sepsis due to Serratia: Secondary | ICD-10-CM

## 2013-05-04 DIAGNOSIS — F329 Major depressive disorder, single episode, unspecified: Secondary | ICD-10-CM

## 2013-05-04 DIAGNOSIS — F3289 Other specified depressive episodes: Secondary | ICD-10-CM

## 2013-05-04 DIAGNOSIS — R109 Unspecified abdominal pain: Secondary | ICD-10-CM

## 2013-05-04 DIAGNOSIS — E440 Moderate protein-calorie malnutrition: Secondary | ICD-10-CM

## 2013-05-04 HISTORY — DX: Other cholangitis: K83.09

## 2013-05-04 LAB — COMPREHENSIVE METABOLIC PANEL
ALK PHOS: 601 U/L — AB (ref 39–117)
ALT: 27 U/L (ref 0–35)
AST: 25 U/L (ref 0–37)
Albumin: 1.9 g/dL — ABNORMAL LOW (ref 3.5–5.2)
BUN: 12 mg/dL (ref 6–23)
CALCIUM: 8.4 mg/dL (ref 8.4–10.5)
CO2: 20 mEq/L (ref 19–32)
Chloride: 96 mEq/L (ref 96–112)
Creatinine, Ser: 0.71 mg/dL (ref 0.50–1.10)
GFR calc Af Amer: >90 mL/min (ref >90)
GFR calc non Af Amer: >90 mL/min (ref >90)
Glucose, Bld: 111 mg/dL — ABNORMAL HIGH (ref 70–99)
POTASSIUM: 3.6 meq/L — AB (ref 3.7–5.3)
SODIUM: 129 meq/L — AB (ref 137–147)
TOTAL PROTEIN: 6.4 g/dL (ref 6.0–8.3)
Total Bilirubin: 4.5 mg/dL — ABNORMAL HIGH (ref 0.3–1.2)

## 2013-05-04 LAB — CBC WITH DIFFERENTIAL/PLATELET
BASOS ABS: 0 10*3/uL (ref 0.0–0.1)
Basophils Relative: 0 % (ref 0–1)
EOS ABS: 0 10*3/uL (ref 0.0–0.7)
EOS PCT: 0 % (ref 0–5)
HCT: 28.1 % — ABNORMAL LOW (ref 36.0–46.0)
Hemoglobin: 9.5 g/dL — ABNORMAL LOW (ref 12.0–15.0)
Lymphocytes Relative: 7 % — ABNORMAL LOW (ref 12–46)
Lymphs Abs: 0.6 10*3/uL — ABNORMAL LOW (ref 0.7–4.0)
MCH: 30.7 pg (ref 26.0–34.0)
MCHC: 33.8 g/dL (ref 30.0–36.0)
MCV: 90.9 fL (ref 78.0–100.0)
Monocytes Absolute: 0.9 10*3/uL (ref 0.1–1.0)
Monocytes Relative: 11 % (ref 3–12)
NEUTROS PCT: 82 % — AB (ref 43–77)
Neutro Abs: 6.9 10*3/uL (ref 1.7–7.7)
Platelets: 280 10*3/uL (ref 150–400)
RBC: 3.09 MIL/uL — ABNORMAL LOW (ref 3.87–5.11)
RDW: 13.8 % (ref 11.5–15.5)
WBC: 8.5 10*3/uL (ref 4.0–10.5)

## 2013-05-04 LAB — I-STAT CG4 LACTIC ACID, ED: LACTIC ACID, VENOUS: 1.44 mmol/L (ref 0.5–2.2)

## 2013-05-04 LAB — LIPASE, BLOOD: Lipase: 9 U/L — ABNORMAL LOW (ref 11–59)

## 2013-05-04 MED ORDER — VANCOMYCIN HCL IN DEXTROSE 1-5 GM/200ML-% IV SOLN
1000.0000 mg | Freq: Once | INTRAVENOUS | Status: AC
  Administered 2013-05-05: 1000 mg via INTRAVENOUS
  Filled 2013-05-04: qty 200

## 2013-05-04 MED ORDER — RIVAROXABAN 20 MG PO TABS
20.0000 mg | ORAL_TABLET | Freq: Every evening | ORAL | Status: DC
  Administered 2013-05-05 – 2013-05-07 (×3): 20 mg via ORAL
  Filled 2013-05-04 (×4): qty 1

## 2013-05-04 MED ORDER — CITALOPRAM HYDROBROMIDE 20 MG PO TABS
20.0000 mg | ORAL_TABLET | Freq: Two times a day (BID) | ORAL | Status: DC
  Administered 2013-05-05 – 2013-05-07 (×5): 20 mg via ORAL
  Filled 2013-05-04 (×8): qty 1

## 2013-05-04 MED ORDER — PANTOPRAZOLE SODIUM 40 MG PO TBEC
40.0000 mg | DELAYED_RELEASE_TABLET | Freq: Every day | ORAL | Status: DC
  Administered 2013-05-05 – 2013-05-15 (×11): 40 mg via ORAL
  Filled 2013-05-04 (×11): qty 1

## 2013-05-04 MED ORDER — PIPERACILLIN-TAZOBACTAM 3.375 G IVPB
3.3750 g | Freq: Three times a day (TID) | INTRAVENOUS | Status: DC
  Administered 2013-05-05 – 2013-05-07 (×9): 3.375 g via INTRAVENOUS
  Filled 2013-05-04 (×11): qty 50

## 2013-05-04 MED ORDER — SODIUM CHLORIDE 0.9 % IJ SOLN
3.0000 mL | Freq: Two times a day (BID) | INTRAMUSCULAR | Status: DC
  Administered 2013-05-10: 3 mL via INTRAVENOUS

## 2013-05-04 MED ORDER — SODIUM CHLORIDE 0.9 % IV SOLN: INTRAVENOUS | Status: DC

## 2013-05-04 MED ORDER — SODIUM CHLORIDE 0.9 % IV SOLN
INTRAVENOUS | Status: DC
  Administered 2013-05-05 – 2013-05-15 (×21): via INTRAVENOUS

## 2013-05-04 MED ORDER — LORAZEPAM 1 MG PO TABS: 1.0000 mg | ORAL_TABLET | Freq: Four times a day (QID) | ORAL | Status: DC | PRN

## 2013-05-04 MED ORDER — DILTIAZEM HCL ER COATED BEADS 180 MG PO CP24
180.0000 mg | ORAL_CAPSULE | Freq: Every day | ORAL | Status: DC
  Administered 2013-05-05 – 2013-05-15 (×11): 180 mg via ORAL
  Filled 2013-05-04 (×11): qty 1

## 2013-05-04 MED ORDER — FENTANYL 100 MCG/HR TD PT72
100.0000 ug | MEDICATED_PATCH | TRANSDERMAL | Status: DC
  Filled 2013-05-04: qty 1

## 2013-05-04 MED ORDER — METOPROLOL TARTRATE 50 MG PO TABS
75.0000 mg | ORAL_TABLET | Freq: Two times a day (BID) | ORAL | Status: DC
  Administered 2013-05-05 (×2): 75 mg via ORAL
  Filled 2013-05-04 (×4): qty 1

## 2013-05-04 MED ORDER — ACETAMINOPHEN 325 MG PO TABS
650.0000 mg | ORAL_TABLET | Freq: Four times a day (QID) | ORAL | Status: DC | PRN
  Administered 2013-05-05: 650 mg via ORAL
  Filled 2013-05-04: qty 2

## 2013-05-04 MED ORDER — LORATADINE 10 MG PO TABS
10.0000 mg | ORAL_TABLET | Freq: Every day | ORAL | Status: DC
  Administered 2013-05-06 – 2013-05-09 (×2): 10 mg via ORAL
  Filled 2013-05-04 (×11): qty 1

## 2013-05-04 MED ORDER — POTASSIUM CHLORIDE ER 10 MEQ PO TBCR
10.0000 meq | EXTENDED_RELEASE_TABLET | Freq: Two times a day (BID) | ORAL | Status: DC
  Administered 2013-05-05 – 2013-05-15 (×22): 10 meq via ORAL
  Filled 2013-05-04 (×25): qty 1

## 2013-05-04 MED ORDER — ONDANSETRON HCL 4 MG/2ML IJ SOLN
4.0000 mg | Freq: Four times a day (QID) | INTRAMUSCULAR | Status: DC | PRN
  Administered 2013-05-13: 4 mg via INTRAVENOUS
  Filled 2013-05-04: qty 2

## 2013-05-04 MED ORDER — HYDROMORPHONE HCL PF 1 MG/ML IJ SOLN
1.0000 mg | INTRAMUSCULAR | Status: DC | PRN
  Administered 2013-05-05 – 2013-05-15 (×41): 1 mg via INTRAVENOUS
  Filled 2013-05-04 (×42): qty 1

## 2013-05-04 MED ORDER — ONDANSETRON HCL 4 MG PO TABS
4.0000 mg | ORAL_TABLET | Freq: Four times a day (QID) | ORAL | Status: DC | PRN
  Filled 2013-05-04: qty 1

## 2013-05-04 MED ORDER — ZOLPIDEM TARTRATE 5 MG PO TABS
5.0000 mg | ORAL_TABLET | Freq: Every evening | ORAL | Status: DC | PRN
  Filled 2013-05-04: qty 1

## 2013-05-04 MED ORDER — DOCUSATE SODIUM 100 MG PO CAPS
100.0000 mg | ORAL_CAPSULE | Freq: Two times a day (BID) | ORAL | Status: DC
  Administered 2013-05-05 – 2013-05-15 (×18): 100 mg via ORAL
  Filled 2013-05-04 (×22): qty 1

## 2013-05-04 MED ORDER — HYDROMORPHONE HCL 2 MG PO TABS
4.0000 mg | ORAL_TABLET | ORAL | Status: DC | PRN
  Administered 2013-05-07 – 2013-05-13 (×23): 4 mg via ORAL
  Filled 2013-05-04 (×24): qty 2

## 2013-05-04 MED ORDER — ACETAMINOPHEN 650 MG RE SUPP: 650.0000 mg | Freq: Four times a day (QID) | RECTAL | Status: DC | PRN

## 2013-05-04 NOTE — H&P (Addendum)
PCP:  Gwendolyn Grant, MD  Oncology [facility], [place]) Radiology [facility]  Chief Complaint:  fever  HPI: [name] is a 63 y.o. female   has a past medical history of Diastolic dysfunction; MIGRAINE HEADACHE; Atrial fibrillation; Anemia; HTN (hypertension); GLAUCOMA; CARPAL TUNNEL SYNDROME, RIGHT; Lymphedema; Hepatic steatosis; DVT (deep venous thrombosis) (10/2010); CHF (congestive heart failure); Arthritis; GERD (gastroesophageal reflux disease); Small kidney; Radiation (10/11/11-11/19/11); Radiation (03/16/10-03/25/10); Radiation (06/11/08-07/23/08); Hyperlipidemia; Leg swelling; Cervical cancer (dx 04/2003, recur 04/2008); and Osteoporosis with fracture (2014).   Presented with  Patient was diagnosed with cervical cancer since 2005 sp total Hysterectomy with recurrence in 2010 sp CXR and radiation treatment with metastatic spread to the liver since 2012 recently with biliary obstruction sp stenting with drain placement by Dr Kathlene Cote. Done 04/09/13 left side and 2/27 on the right. Since the last procedure patient have had some low grade fevers for the past 3-4 days but today her fever spiked to 102.6. Her abdominal pain has not changed does not endorse worsening output from her drains. Family states that there is increase in drainage around the tubes with some redness of the skin.  Denies any nausea vomittng, endorses some diarrhea x 2 BM's, no sick contacts. Hospitalist calleld for admission with IR consult in AM for presumed cholangitis.    Review of Systems:     Pertinent positives include: Fevers, chills, abdominal pain, diarrhea, fatigue,  Constitutional:  No weight loss, night sweats,  weight loss  HEENT:  No headaches, Difficulty swallowing,Tooth/dental problems,Sore throat,  No sneezing, itching, ear ache, nasal congestion, post nasal drip,  Cardio-vascular:  No chest pain, Orthopnea, PND, anasarca, dizziness, palpitations.no Bilateral lower  extremity swelling  GI:  No heartburn, indigestion,  nausea, vomiting,  change in bowel habits, loss of appetite, melena, blood in stool, hematemesis Resp:  no shortness of breath at rest. No dyspnea on exertion, No excess mucus, no productive cough, No non-productive cough, No coughing up of blood.No change in color of mucus.No wheezing. Skin:  no rash or lesions. No jaundice GU:  no dysuria, change in color of urine, no urgency or frequency. No straining to urinate.  No flank pain.  Musculoskeletal:  No joint pain or no joint swelling. No decreased range of motion. No back pain.  Psych:  No change in mood or affect. No depression or anxiety. No memory loss.  Neuro: no localizing neurological complaints, no tingling, no weakness, no double vision, no gait abnormality, no slurred speech, no confusion  Otherwise ROS are negative except for above, 10 systems were reviewed  Past Medical History: Past Medical History  Diagnosis Date  . Diastolic dysfunction   . MIGRAINE HEADACHE   . Atrial fibrillation     failed DCCV, CHADs2-prev pradaxa stopped due to hematuria with ureter issues/JJ   . Anemia   . HTN (hypertension)   . GLAUCOMA   . CARPAL TUNNEL SYNDROME, RIGHT   . Lymphedema     L>R leg from pelvic XRT/scarring  . Hepatic steatosis     CT scan 06/2009, 12/2009  . DVT (deep venous thrombosis) 10/2010    LLE, anticoag resumed  . CHF (congestive heart failure)     1 yr ago per pt  . Arthritis   . GERD (gastroesophageal reflux disease)   . Small kidney     left  . Radiation 10/11/11-11/19/11    5040 cGy in 28 fx's/periaortic  . Radiation 03/16/10-03/25/10    right inguinal node/left external iliac node  .  Radiation 06/11/08-07/23/08    6000 cGy left pelvis  . Hyperlipidemia   . Leg swelling     left  . Cervical cancer dx 04/2003, recur 04/2008    s/p debulk (TAHBSO), chemo/XRT; recurrent dz L pelvis dx 2010 and liver met 09/2010 s/p RFA  . Osteoporosis with fracture 2014     compression fx, s/p KP ; started prolia 06/2012- narcotic pain mgmt   Past Surgical History  Procedure Laterality Date  . Ureteral stent placement  2005, 01/2010, 07/2010    L hydro related to cervical ca and XRT  . Oophorectomy  2005  . Cardioversion    . Tonsillectomy    . Cholecystectomy  1984  . Total abdominal hysterectomy  2005  . Carpal tunnel  2009    right  . Ivc filter  04/2011     Medications: Prior to Admission medications   Medication Sig Start Date End Date Taking? Authorizing Provider  bisacodyl (DULCOLAX) 5 MG EC tablet Take 5 mg by mouth daily as needed for constipation.   Yes Historical Provider, MD  cetirizine (ZYRTEC) 10 MG tablet Take 10 mg by mouth daily as needed for allergies.    Yes Historical Provider, MD  citalopram (CELEXA) 20 MG tablet Take 20 mg by mouth 2 (two) times daily.  10/03/12  Yes Historical Provider, MD  diltiazem (CARDIZEM CD) 180 MG 24 hr capsule Take 1 capsule (180 mg total) by mouth daily. TAke 2-3 hours before of after Metoprolol 12/27/12  Yes Rowe Clack, MD  docusate sodium (COLACE) 100 MG capsule Take 1 capsule (100 mg total) by mouth 2 (two) times daily. 01/23/13  Yes Vivien Rota, NP  fentaNYL (DURAGESIC - DOSED MCG/HR) 100 MCG/HR Apply 2 patches every 72 hours (total of 200 mcg) 04/19/13  Yes Deatra Robinson, MD  ferrous fumarate (HEMOCYTE - 106 MG FE) 325 (106 FE) MG TABS Take 1 tablet (106 mg of iron total) by mouth daily. 08/04/12  Yes Monika Salk, MD  folic acid (FOLVITE) 1 MG tablet Take 1 tablet (1 mg total) by mouth every morning. 04/19/13  Yes Deatra Robinson, MD  HYDROmorphone (DILAUDID) 4 MG tablet Take 1 tablet every 4 hours as needed for pain 04/19/13  Yes Deatra Robinson, MD  ibuprofen (ADVIL,MOTRIN) 200 MG tablet Take 600 mg by mouth every 4 (four) hours as needed. Pain   Yes Historical Provider, MD  lidocaine-prilocaine (EMLA) cream Apply 1 application topically daily as needed (for port access). 02/08/13  Yes Deatra Robinson, MD  LORazepam (ATIVAN) 1 MG tablet Take 1 tablet (1 mg total) by mouth every 6 (six) hours as needed (for nausea (may put under tongue)). 11/16/12  Yes Deatra Robinson, MD  metoprolol tartrate (LOPRESSOR) 25 MG tablet Take 3 tablets (75 mg total) by mouth 2 (two) times daily. 03/29/13  Yes Rowe Clack, MD  Nutritional Supplements (CARNATION INSTANT BREAKFAST PO) Take 1 Can by mouth 4 (four) times daily as needed (for meals).   Yes Historical Provider, MD  ondansetron (ZOFRAN) 8 MG tablet Take 1 tablet (8 mg total) by mouth every 8 (eight) hours as needed for nausea. 11/16/12  Yes Deatra Robinson, MD  pantoprazole (PROTONIX) 40 MG tablet Take 1 tablet (40 mg total) by mouth daily. 03/23/13  Yes Minette Headland, NP  potassium chloride (KLOR-CON) 8 MEQ tablet Take 8 mEq by mouth 2 (two) times daily.    Yes Historical Provider, MD  Rivaroxaban (XARELTO) 20  MG TABS tablet Take 1 tablet (20 mg total) by mouth every evening. 03/29/13  Yes Rowe Clack, MD  sodium chloride (OCEAN) 0.65 % nasal spray Place 1 spray into the nose daily as needed for congestion. Allergies   Yes Historical Provider, MD  triamcinolone (NASACORT) 55 MCG/ACT nasal inhaler Place 2 sprays into the nose daily as needed (allergies or congestion).    Yes Historical Provider, MD  zolpidem (AMBIEN) 5 MG tablet Take 1 tablet (5 mg total) by mouth at bedtime as needed for sleep. 02/14/13   Minette Headland, NP    Allergies:   Allergies  Allergen Reactions  . Codeine Other (See Comments)    Breathing issues  . Flecainide Acetate Other (See Comments)    Leg cramping   . Morphine Other (See Comments)     Hallucinations  . Propafenone Hcl Other (See Comments)    Leg cramps    Social History:  Ambulatory  Independently  Lives at  Home with family   reports that she quit smoking about 29 years ago. She has never used smokeless tobacco. She reports that she does not drink alcohol or use illicit drugs.   Family  History: family history includes Asthma in her son and another family member; Cancer in her mother; Heart disease in an other family member; Hypertension in an other family member; Lung cancer in an other family member; Stroke in her father and another family member.    Physical Exam: Patient Vitals for the past 24 hrs:  BP Temp Pulse Resp SpO2 Weight  05/04/13 1931 95/58 mmHg 98.3 F (36.8 C) 107 20 96 % 72.576 kg (160 lb)    1. General:  in No Acute distress 2. Psychological: Alert and  Oriented 3. Head/ENT:   Moist  Mucous Membranes, icteric                          Head Non traumatic, neck supple                          Normal Dentition 4. SKIN:  decreased Skin turgor,  Skin clean Dry and intact no rash, skin around drain site is somewhat red with some drainage noted of bilious and serosanguinous discharge.  5. Heart: Regular rate and rhythm no Murmur, Rub or gallop 6. Lungs: Clear to auscultation bilaterally, no wheezes or crackles   7. Abdomen: Soft,  Tenderness in RUQ and around the drain site, Non distended 8. Lower extremities: no clubbing, cyanosis, or edema, left leg chronically enlarged  9. Neurologically Grossly intact, moving all 4 extremities equally 10. MSK: Normal range of motion  body mass index is 30.25 kg/(m^2).   Labs on Admission:   Recent Labs  05/03/13 1501 05/04/13 2041  NA 136 129*  K 3.6 3.6*  CL  --  96  CO2 23 20  GLUCOSE 101 111*  BUN 11.5 12  CREATININE 0.7 0.71  CALCIUM 8.8 8.4    Recent Labs  05/03/13 1501 05/04/13 2041  AST 29 25  ALT 32 27  ALKPHOS 639* 601*  BILITOT 4.41* 4.5*  PROT 6.5 6.4  ALBUMIN 2.1* 1.9*    Recent Labs  05/04/13 2041  LIPASE 9*    Recent Labs  05/03/13 1501 05/04/13 2041  WBC 6.7 8.5  NEUTROABS 5.2 6.9  HGB 10.2* 9.5*  HCT 30.9* 28.1*  MCV 92.8 90.9  PLT 332 280   No  results found for this basename: CKTOTAL, CKMB, CKMBINDEX, TROPONINI,  in the last 72 hours No results found for this  basename: TSH, T4TOTAL, FREET3, T3FREE, THYROIDAB,  in the last 72 hours No results found for this basename: VITAMINB12, FOLATE, FERRITIN, TIBC, IRON, RETICCTPCT,  in the last 72 hours Lab Results  Component Value Date   HGBA1C 5.2 09/19/2008    The CrCl is unknown because both a height and weight (above a minimum accepted value) are required for this calculation. ABG    Component Value Date/Time   TCO2 22 10/05/2011 1038     No results found for this basename: DDIMER         Cultures:    Component Value Date/Time   SDES URINE, CLEAN CATCH 12/17/2012 1511   SPECREQUEST NONE 12/17/2012 1511   CULT  Value: NO GROWTH Performed at [facility] 12/17/2012 1511   REPTSTATUS 12/18/2012 FINAL 12/17/2012 1511       Radiological Exams on Admission: Dg Chest 2 View  05/04/2013   CLINICAL DATA:  Fever for the past several days.  Jaundice.  EXAM: CHEST  2 VIEW  COMPARISON:  Chest x-ray 07/29/2012.  FINDINGS: Right internal jugular single-lumen power porta cath with tip terminating in the distal superior vena cava. Lung volumes are low. Linear opacity in the left mid lung is unchanged, compatible with an area of scarring. Linear bibasilar opacities may reflect additional areas of mild scarring or subsegmental atelectasis. No consolidative airspace disease. No definite pleural effusions. No evidence of pulmonary edema. Heart size is normal. Mediastinal contours are unremarkable. Atherosclerosis in the thoracic aorta. 2 percutaneous biliary drains are seen projecting over the upper abdomen. Numerous surgical clips are present in the right upper quadrant, compatible with prior cholecystectomy. Post vertebroplasty changes are noted in the upper lumbar spine.  IMPRESSION: 1. No radiographic evidence of acute cardiopulmonary disease. The appearance the chest is similar to prior studies, as above.   Electronically Signed   By: Vinnie Langton M.D.   On: 05/04/2013 21:25    Chart has been  reviewed  Assessment/Plan  63 yo F with hx of Cervical Cancer with metastatic spread to liver with biliary obstruction now sp  Stenting presents with fever and redness around the site on Right drain insertion.   Present on Admission:  . SIRS (systemic inflammatory response syndrome) - await results of blood cultures. Broad spectrum antibiotics Zosyn and vancomycin, CT of the abdomen to evaluate for any intra-abdominal source of infection as well as to evaluate biliary ducts. IR to see patient in a.m. for possible drain exchange  . Cervical cancer - will need to notify oncology in a.m. patient is here  . Atrial fibrillation - continue on metoprolol and Cardizem as well as a relative but given history of her disease would check INR History of DVT status post IVC filter - patient currently on Xarelto may also have coagulopathy due to liver disease which needs to be assessed  . Anemia of chronic disease -at baseline continue to follow carefully  . Hyponatremia - likely secondary to liver disease. Will obtain urine electrolytes with gentle IV fluids patient appears to be dehydrated  . Protein-calorie malnutrition, severe - once patient carries and diet would need nutrition consult  . Malignant obstructive jaundice - as per interventional radiology  . Abdominal pain, unspecified site - continue home dose of Dilaudid continue fentanyl patch. Will add IV Dilaudid as needed  . cholangitis - history of biliary obstruction and fever worrisome for  cholangitis. We'll obtain CT scan to further evaluate. Cover broadly with Zosyn and vancomycin. Interventional radiology to see in a.m.   Prophylaxis: xarelto, Protonix  CODE STATUS: FULL CODE  Other plan as per orders.  I have spent a total of 65 min on this admission extra time was taken to discuss care of consultants   [place] 05/04/2013, 10:26 PM

## 2013-05-04 NOTE — ED Notes (Signed)
Pt c/o fever up to 102 today. Pt does have 2 biliary drains in place. Pt reports leaking around R lateral drain. Pt appears pale with yellow sclera.

## 2013-05-04 NOTE — ED Notes (Signed)
Family at bedside. 

## 2013-05-04 NOTE — ED Provider Notes (Signed)
CSN: [id number]     Arrival date & time 05/04/13  1921 History   First MD Initiated Contact with Patient 05/04/13 2012     Chief Complaint  Patient presents with  . Fever     (Consider location/radiation/quality/duration/timing/severity/associated sxs/prior Treatment) Patient is a 63 y.o. female presenting with fever. The history is provided by the patient.  Fever  patient here complaining of fever up to 102 today. Took Tylenol with some relief. Has a history of cervical cancer that is metastatic and has biliiary drains in place. Denies abdominal pain. Has been vomiting or diarrhea. No cough or congestion. Patient has noted drainage around her biliary drain site. Seen at the [facility] yesterday and she also was seen by interventional radiology and her drains were functioning appropriately. Called her physician and was told to come here for further evaluation and possible admission with IV antibiotics for possible cholangitis  Past Medical History  Diagnosis Date  . Diastolic dysfunction   . MIGRAINE HEADACHE   . Atrial fibrillation     failed DCCV, CHADs2-prev pradaxa stopped due to hematuria with ureter issues/JJ   . Anemia   . HTN (hypertension)   . GLAUCOMA   . CARPAL TUNNEL SYNDROME, RIGHT   . Lymphedema     L>R leg from pelvic XRT/scarring  . Hepatic steatosis     CT scan 06/2009, 12/2009  . DVT (deep venous thrombosis) 10/2010    LLE, anticoag resumed  . CHF (congestive heart failure)     1 yr ago per pt  . Arthritis   . GERD (gastroesophageal reflux disease)   . Small kidney     left  . Radiation 10/11/11-11/19/11    5040 cGy in 28 fx's/periaortic  . Radiation 03/16/10-03/25/10    right inguinal node/left external iliac node  . Radiation 06/11/08-07/23/08    6000 cGy left pelvis  . Hyperlipidemia   . Leg swelling     left  . Cervical cancer dx 04/2003, recur 04/2008    s/p debulk (TAHBSO), chemo/XRT; recurrent dz L pelvis dx 2010 and liver met 09/2010 s/p RFA  .  Osteoporosis with fracture 2014    compression fx, s/p KP ; started prolia 06/2012- narcotic pain mgmt   Past Surgical History  Procedure Laterality Date  . Ureteral stent placement  2005, 01/2010, 07/2010    L hydro related to cervical ca and XRT  . Oophorectomy  2005  . Cardioversion    . Tonsillectomy    . Cholecystectomy  1984  . Total abdominal hysterectomy  2005  . Carpal tunnel  2009    right  . Ivc filter  04/2011   Family History  Problem Relation Age of Onset  . Lung cancer    . Hypertension    . Heart disease    . Stroke    . Asthma    . Asthma Son   . Cancer Mother     lung  . Stroke Father    History  Substance Use Topics  . Smoking status: Former Smoker -- 0.50 packs/day for 3 years    Quit date: 03/01/1984  . Smokeless tobacco: Never Used  . Alcohol Use: No   OB History   Grav Para Term Preterm Abortions TAB SAB Ect Mult Living                 Review of Systems  Constitutional: Positive for fever.  All other systems reviewed and are negative.      Allergies  Codeine; Flecainide acetate; Morphine; and Propafenone hcl  Home Medications   Current Outpatient Rx  Name  Route  Sig  Dispense  Refill  . bisacodyl (DULCOLAX) 5 MG EC tablet   Oral   Take 5 mg by mouth daily as needed for constipation.         . cetirizine (ZYRTEC) 10 MG tablet   Oral   Take 10 mg by mouth daily as needed for allergies.          . citalopram (CELEXA) 20 MG tablet   Oral   Take 20 mg by mouth 2 (two) times daily.          [name] diltiazem (CARDIZEM CD) 180 MG 24 hr capsule   Oral   Take 1 capsule (180 mg total) by mouth daily. TAke 2-3 hours before of after Metoprolol   30 capsule   0   . docusate sodium (COLACE) 100 MG capsule   Oral   Take 1 capsule (100 mg total) by mouth 2 (two) times daily.   60 capsule   1   . fentaNYL (DURAGESIC - DOSED MCG/HR) 100 MCG/HR      Apply 2 patches every 72 hours (total of 200 mcg)   20 patch   0   . ferrous  fumarate (HEMOCYTE - 106 MG FE) 325 (106 FE) MG TABS   Oral   Take 1 tablet (106 mg of iron total) by mouth daily.   30 each   0   . folic acid (FOLVITE) 1 MG tablet   Oral   Take 1 tablet (1 mg total) by mouth every morning.   30 tablet   6   . HYDROmorphone (DILAUDID) 4 MG tablet      Take 1 tablet every 4 hours as needed for pain   120 tablet   0   . ibuprofen (ADVIL,MOTRIN) 200 MG tablet   Oral   Take 600 mg by mouth every 4 (four) hours as needed. Pain         . lidocaine-prilocaine (EMLA) cream   Topical   Apply 1 application topically daily as needed (for port access).   30 g   6   . LORazepam (ATIVAN) 1 MG tablet   Oral   Take 1 tablet (1 mg total) by mouth every 6 (six) hours as needed (for nausea (may put under tongue)).   30 tablet   2   . metoprolol tartrate (LOPRESSOR) 25 MG tablet   Oral   Take 3 tablets (75 mg total) by mouth 2 (two) times daily.   180 tablet   2   . Nutritional Supplements (CARNATION INSTANT BREAKFAST PO)   Oral   Take 1 Can by mouth 4 (four) times daily as needed (for meals).         . ondansetron (ZOFRAN) 8 MG tablet   Oral   Take 1 tablet (8 mg total) by mouth every 8 (eight) hours as needed for nausea.   30 tablet   2   . pantoprazole (PROTONIX) 40 MG tablet   Oral   Take 1 tablet (40 mg total) by mouth daily.   30 tablet   2   . potassium chloride (KLOR-CON) 8 MEQ tablet   Oral   Take 8 mEq by mouth 2 (two) times daily.          . Rivaroxaban (XARELTO) 20 MG TABS tablet   Oral   Take 1 tablet (20 mg total) by mouth every  evening.   30 tablet   5   . sodium chloride (OCEAN) 0.65 % nasal spray   Nasal   Place 1 spray into the nose daily as needed for congestion. Allergies         . triamcinolone (NASACORT) 55 MCG/ACT nasal inhaler   Nasal   Place 2 sprays into the nose daily as needed (allergies or congestion).          [name] zolpidem (AMBIEN) 5 MG tablet   Oral   Take 1 tablet (5 mg total) by mouth  at bedtime as needed for sleep.   20 tablet   0    BP 95/58  Pulse 107  Temp(Src) 98.3 F (36.8 C)  Resp 20  Wt 160 lb (72.576 kg)  SpO2 96% Physical Exam  Nursing note and vitals reviewed. Constitutional: She is oriented to person, place, and time. She appears well-developed and well-nourished.  Non-toxic appearance. No distress.  HENT:  Head: Normocephalic and atraumatic.  Eyes: Conjunctivae, EOM and lids are normal. Pupils are equal, round, and reactive to light.  Neck: Normal range of motion. Neck supple. No tracheal deviation present. No mass present.  Cardiovascular: Regular rhythm and normal heart sounds.  Tachycardia present.  Exam reveals no gallop.   No murmur heard. Pulmonary/Chest: Effort normal and breath sounds normal. No stridor. No respiratory distress. She has no decreased breath sounds. She has no wheezes. She has no rhonchi. She has no rales.  Abdominal: Soft. Normal appearance and bowel sounds are normal. She exhibits no distension. There is no tenderness. There is no rigidity, no rebound, no guarding and no CVA tenderness.    Musculoskeletal: Normal range of motion. She exhibits no edema and no tenderness.  Neurological: She is alert and oriented to person, place, and time. She has normal strength. No cranial nerve deficit or sensory deficit. GCS eye subscore is 4. GCS verbal subscore is 5. GCS motor subscore is 6.  Skin: Skin is warm and dry. No abrasion and no rash noted.  Psychiatric: She has a normal mood and affect. Her speech is normal and behavior is normal.    ED Course  Procedures (including critical care time) Labs Review Labs Reviewed  CULTURE, BLOOD (ROUTINE X 2)  CULTURE, BLOOD (ROUTINE X 2)  CBC WITH DIFFERENTIAL  COMPREHENSIVE METABOLIC PANEL  LIPASE, BLOOD  I-STAT CG4 LACTIC ACID, ED   Imaging Review No results found.   EKG Interpretation None      MDM   Final diagnoses:  None    Patient given IV fluids here. Spoke with the  radiologist who recommends admission for treatment of suspected cholangitis. Spoke with [facility] hospitalist and they will admit    Leota Jacobsen, MD 05/04/13 2213

## 2013-05-04 NOTE — ED Notes (Signed)
MD at bedside. 

## 2013-05-05 ENCOUNTER — Inpatient Hospital Stay (HOSPITAL_COMMUNITY): Payer: 59

## 2013-05-05 DIAGNOSIS — R109 Unspecified abdominal pain: Secondary | ICD-10-CM

## 2013-05-05 LAB — COMPREHENSIVE METABOLIC PANEL
ALT: 24 U/L (ref 0–35)
AST: 21 U/L (ref 0–37)
Albumin: 1.7 g/dL — ABNORMAL LOW (ref 3.5–5.2)
Alkaline Phosphatase: 484 U/L — ABNORMAL HIGH (ref 39–117)
BUN: 10 mg/dL (ref 6–23)
CO2: 22 meq/L (ref 19–32)
CREATININE: 0.58 mg/dL (ref 0.50–1.10)
Calcium: 7.9 mg/dL — ABNORMAL LOW (ref 8.4–10.5)
Chloride: 97 mEq/L (ref 96–112)
GLUCOSE: 88 mg/dL (ref 70–99)
Potassium: 3.2 mEq/L — ABNORMAL LOW (ref 3.7–5.3)
Sodium: 130 mEq/L — ABNORMAL LOW (ref 137–147)
TOTAL PROTEIN: 5.3 g/dL — AB (ref 6.0–8.3)
Total Bilirubin: 4 mg/dL — ABNORMAL HIGH (ref 0.3–1.2)

## 2013-05-05 LAB — CBC
HEMATOCRIT: 25.7 % — AB (ref 36.0–46.0)
HEMOGLOBIN: 8.4 g/dL — AB (ref 12.0–15.0)
MCH: 30 pg (ref 26.0–34.0)
MCHC: 32.7 g/dL (ref 30.0–36.0)
MCV: 91.8 fL (ref 78.0–100.0)
Platelets: 237 10*3/uL (ref 150–400)
RBC: 2.8 MIL/uL — ABNORMAL LOW (ref 3.87–5.11)
RDW: 13.9 % (ref 11.5–15.5)
WBC: 6.2 10*3/uL (ref 4.0–10.5)

## 2013-05-05 LAB — TSH: TSH: 1.904 u[IU]/mL (ref 0.350–4.500)

## 2013-05-05 LAB — OSMOLALITY, URINE: OSMOLALITY UR: 298 mosm/kg — AB (ref 390–1090)

## 2013-05-05 LAB — PHOSPHORUS: Phosphorus: 2 mg/dL — ABNORMAL LOW (ref 2.3–4.6)

## 2013-05-05 LAB — PROTIME-INR
INR: 3.84 — AB (ref 0.00–1.49)
Prothrombin Time: 36.3 seconds — ABNORMAL HIGH (ref 11.6–15.2)

## 2013-05-05 LAB — MAGNESIUM: MAGNESIUM: 1.6 mg/dL (ref 1.5–2.5)

## 2013-05-05 LAB — SODIUM, URINE, RANDOM: SODIUM UR: 23 meq/L

## 2013-05-05 LAB — CREATININE, URINE, RANDOM: Creatinine, Urine: 63.4 mg/dL

## 2013-05-05 LAB — PROCALCITONIN: PROCALCITONIN: 0.68 ng/mL

## 2013-05-05 MED ORDER — K PHOS MONO-SOD PHOS DI & MONO 155-852-130 MG PO TABS
250.0000 mg | ORAL_TABLET | Freq: Two times a day (BID) | ORAL | Status: AC
  Administered 2013-05-05 (×2): 250 mg via ORAL
  Filled 2013-05-05 (×2): qty 1

## 2013-05-05 MED ORDER — POTASSIUM CHLORIDE CRYS ER 20 MEQ PO TBCR: 20.0000 meq | EXTENDED_RELEASE_TABLET | Freq: Once | ORAL | Status: DC

## 2013-05-05 MED ORDER — IOHEXOL 300 MG/ML  SOLN
50.0000 mL | Freq: Once | INTRAMUSCULAR | Status: AC | PRN
  Administered 2013-05-05: 50 mL via ORAL

## 2013-05-05 MED ORDER — VANCOMYCIN HCL IN DEXTROSE 1-5 GM/200ML-% IV SOLN
1000.0000 mg | Freq: Two times a day (BID) | INTRAVENOUS | Status: DC
  Administered 2013-05-05 – 2013-05-07 (×5): 1000 mg via INTRAVENOUS
  Filled 2013-05-05 (×7): qty 200

## 2013-05-05 MED ORDER — SODIUM CHLORIDE 0.9 % IJ SOLN
10.0000 mL | INTRAMUSCULAR | Status: DC | PRN
  Administered 2013-05-08 – 2013-05-15 (×5): 10 mL

## 2013-05-05 MED ORDER — IOHEXOL 300 MG/ML  SOLN
100.0000 mL | Freq: Once | INTRAMUSCULAR | Status: AC | PRN
  Administered 2013-05-05: 100 mL via INTRAVENOUS

## 2013-05-05 NOTE — Progress Notes (Signed)
Dr. Wendee Beavers given lab result of blood culture gram negative rods. Maintain current plan of care

## 2013-05-05 NOTE — Progress Notes (Signed)
ANTIBIOTIC CONSULT NOTE - INITIAL  Pharmacy Consult for vancomycin Indication: intra-abdom infection  Allergies  Allergen Reactions  . Codeine Other (See Comments)    Breathing issues  . Flecainide Acetate Other (See Comments)    Leg cramping   . Morphine Other (See Comments)     Hallucinations  . Propafenone Hcl Other (See Comments)    Leg cramps    Patient Measurements: Height: 5\' 1"  (154.9 cm) Weight: 157 lb 12.8 oz (71.578 kg) IBW/kg (Calculated) : 47.8 Adjusted Body Weight:   Vital Signs: Temp: 97.4 F (36.3 C) (03/07 0209) Temp src: Oral (03/07 0209) BP: 104/62 mmHg (03/07 0209) Pulse Rate: 91 (03/07 0209) Intake/Output from previous day:   Intake/Output from this shift:    Labs:  Recent Labs  05/03/13 1501 05/03/13 1501 05/04/13 2041  WBC 6.7  --  8.5  HGB 10.2*  --  9.5*  PLT 332  --  280  CREATININE  --  0.7 0.71   Estimated Creatinine Clearance: 66.8 ml/min (by C-G formula based on Cr of 0.71). No results found for this basename: VANCOTROUGH, Corlis Leak, VANCORANDOM, [place], [place], GENTRANDOM, TOBRATROUGH, TOBRAPEAK, TOBRARND, AMIKACINPEAK, AMIKACINTROU, AMIKACIN,  in the last 72 hours   Microbiology: Recent Results (from the past 720 hour(s))  URINE CULTURE     Status: None   Collection Time    04/16/13  1:00 PM      Result Value Ref Range Status   Urine Culture, Routine Culture, Urine   Final   Comment: Final - ===== COLONY COUNT: =====     NO GROWTH     NO GROWTH    Medical History: Past Medical History  Diagnosis Date  . Diastolic dysfunction   . MIGRAINE HEADACHE   . Atrial fibrillation     failed DCCV, CHADs2-prev pradaxa stopped due to hematuria with ureter issues/JJ   . Anemia   . HTN (hypertension)   . GLAUCOMA   . CARPAL TUNNEL SYNDROME, RIGHT   . Lymphedema     L>R leg from pelvic XRT/scarring  . Hepatic steatosis     CT scan 06/2009, 12/2009  . DVT (deep venous thrombosis) 10/2010    LLE, anticoag resumed  . CHF  (congestive heart failure)     1 yr ago per pt  . Arthritis   . GERD (gastroesophageal reflux disease)   . Small kidney     left  . Radiation 10/11/11-11/19/11    5040 cGy in 28 fx's/periaortic  . Radiation 03/16/10-03/25/10    right inguinal node/left external iliac node  . Radiation 06/11/08-07/23/08    6000 cGy left pelvis  . Hyperlipidemia   . Leg swelling     left  . Cervical cancer dx 04/2003, recur 04/2008    s/p debulk (TAHBSO), chemo/XRT; recurrent dz L pelvis dx 2010 and liver met 09/2010 s/p RFA  . Osteoporosis with fracture 2014    compression fx, s/p KP ; started prolia 06/2012- narcotic pain mgmt    Medications:  Anti-infectives   Start     Dose/Rate Route Frequency Ordered Stop   05/05/13 1000  vancomycin (VANCOCIN) IVPB 1000 mg/200 mL premix     1,000 mg 200 mL/hr over 60 Minutes Intravenous Every 12 hours 05/05/13 0344     05/05/13 0000  vancomycin (VANCOCIN) IVPB 1000 mg/200 mL premix     1,000 mg 200 mL/hr over 60 Minutes Intravenous  Once 05/04/13 2355 05/05/13 0124   05/04/13 2345  piperacillin-tazobactam (ZOSYN) IVPB 3.375 g  3.375 g 12.5 mL/hr over 240 Minutes Intravenous 3 times per day 05/04/13 2336       Assessment: Patient with intra-abdom infection.    Goal of Therapy:  Vancomycin trough level 15-20 mcg/ml  Plan:  Measure antibiotic drug levels at steady state Follow up culture results Vancomycin 1gm iv q12hr  Nani Skillern Crowford 05/05/2013,3:45 AM

## 2013-05-05 NOTE — Progress Notes (Addendum)
Subjective: Patient presented with worsening weakness, fever, chills, leaking around right drain site since recent exchange of left biliary drain and new placement of right biliary drain 04/27/13, history of metastatic cervical cancer with biliary obstruction.   Objective: Physical Exam: BP 105/66  Pulse 90  Temp(Src) 98.2 F (36.8 C) (Oral)  Resp 18  Ht 5\' 1"  (1.549 m)  Wt 157 lb 12.8 oz (71.578 kg)  BMI 29.83 kg/m2  SpO2 99%  Right biliary drain tender, good bilious output, dressing with bile saturation (dressing last changed last evening), skin site slight erythematous directly around site, no breakdown. Left biliary drain dressing C/D/I, NT, good bilious output.  Labs: CBC  Recent Labs  05/04/13 2041 05/05/13 0355  WBC 8.5 6.2  HGB 9.5* 8.4*  HCT 28.1* 25.7*  PLT 280 237   BMET  Recent Labs  05/04/13 2041 05/05/13 0355  NA 129* 130*  K 3.6* 3.2*  CL 96 97  CO2 20 22  GLUCOSE 111* 88  BUN 12 10  CREATININE 0.71 0.58  CALCIUM 8.4 7.9*   LFT  Recent Labs  05/04/13 2041 05/05/13 0355  PROT 6.4 5.3*  ALBUMIN 1.9* 1.7*  AST 25 21  ALT 27 24  ALKPHOS 601* 484*  BILITOT 4.5* 4.0*  LIPASE 9*  --    PT/INR  Recent Labs  05/05/13 0020  LABPROT 36.3*  INR 3.84*     Studies/Results: Dg Chest 2 View  05/04/2013   CLINICAL DATA:  Fever for the past several days.  Jaundice.  EXAM: CHEST  2 VIEW  COMPARISON:  Chest x-ray 07/29/2012.  FINDINGS: Right internal jugular single-lumen power porta cath with tip terminating in the distal superior vena cava. Lung volumes are low. Linear opacity in the left mid lung is unchanged, compatible with an area of scarring. Linear bibasilar opacities may reflect additional areas of mild scarring or subsegmental atelectasis. No consolidative airspace disease. No definite pleural effusions. No evidence of pulmonary edema. Heart size is normal. Mediastinal contours are unremarkable. Atherosclerosis in the thoracic aorta. 2  percutaneous biliary drains are seen projecting over the upper abdomen. Numerous surgical clips are present in the right upper quadrant, compatible with prior cholecystectomy. Post vertebroplasty changes are noted in the upper lumbar spine.  IMPRESSION: 1. No radiographic evidence of acute cardiopulmonary disease. The appearance the chest is similar to prior studies, as above.   Electronically Signed   By: Vinnie Langton M.D.   On: 05/04/2013 21:25   Ct Abdomen Pelvis W Contrast  05/05/2013   CLINICAL DATA:  Evaluate for intra-abdominal abscess  EXAM: CT ABDOMEN AND PELVIS WITH CONTRAST  TECHNIQUE: Multidetector CT imaging of the abdomen and pelvis was performed using the standard protocol following bolus administration of intravenous contrast.  CONTRAST:  63mL OMNIPAQUE IOHEXOL 300 MG/ML SOLN, 170mL OMNIPAQUE IOHEXOL 300 MG/ML SOLN  COMPARISON:  04/05/2013  FINDINGS: BODY WALL: Venous collaterals on the left lower abdomen and upper thigh, chronically occluded left external iliac vein.  LOWER CHEST: Cardiomegaly.  Bibasilar atelectasis.  ABDOMEN/PELVIS:  Liver: Diffuse hepatic metastatic disease which shows no interval change from prior. The largest mass is in the central liver, measuring up to 7 cm. No new hepatic abnormality to suggest liver abscess. There are attenuation differences around multiple peripheral locations in the liver, including right-sided biliary drain, likely reactive. No evidence of acute vascular occlusion.  Improved biliary ductal dilatation status post bilateral biliary drain placement. Both catheters reached the duodenum. No undrained segments suspected.  Pancreas: Unremarkable.  Spleen: Unchanged mild enlargement  Adrenals: Unremarkable.  Kidneys and ureters: Unchanged atrophic, nonenhancing left kidney. Appearance consistent with long-standing vascular compromise, likely related to mass circumferentially and narrowing the left renal artery.  Bladder: Unremarkable.  Reproductive:  Radical hysterectomy and bilateral salpingectomy and oophorectomies.  Bowel: No obstruction. Appendicolith without appendicitis.  Retroperitoneum: Retroperitoneal nodal metastatic disease is unchanged from prior. Index right crural node continues to measure 12 mm short axis. Cystic left node of [place], unchanged at 20 x 34 mm.  Peritoneum: Small volume of free fluid, mildly increased in the pelvic recesses. No loculated collection.  Vascular: IVC filter. 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  OSSEOUS: L1, L2, L3, and L4 superior endplate insufficiency fractures status post bone augmentation at L1 and L3. No acute fracture identified. No evidence of osseous infection.  IMPRESSION: 1. No intra-abdominal abscess or other definitive source of intra-abdominal infection. 2. Decreased biliary dilatation after bilateral biliary drain placement. 3. Intra-abdominal metastatic disease, recently staged (04/05/2013).   Electronically Signed   By: Jorje Guild M.D.   On: 05/05/2013 02:05    Assessment/Plan: Metastatic cervical cancer with biliary obstruction s/p 2/27 left biliary drain exchange and right biliary drain placement. Cholangitis, on IV antibiotics, afebrile today, wbc 6.2  IR will continue to follow patient, will change right biliary drain dressing TID. Plan for IR drain evaluation for persistent leaking once patient is stable and over acute phase.   LOS: 1 day    Rockney Ghee 05/05/2013 9:49 AM

## 2013-05-05 NOTE — Progress Notes (Signed)
UR completed 

## 2013-05-05 NOTE — Progress Notes (Signed)
[facility] PROGRESS NOTE  [name] UKG:[id number] DOB: Nov 24, 1950 DOA: 05/04/2013 PCP: Gwendolyn Grant, MD  Assessment/Plan:  SIRS (systemic inflammatory response syndrome) with possible: Cholangitis - Will continue broad spectrum antibiotics at this juncture - resolving  Active Problems:   Atrial fibrillation - At this point we'll continue metoprolol and diltiazem. -Currently patient is on xarelto    Cervical cancer - Will consider notifying oncologist patient's hospitalization the patient is still here on Monday.    Anemia of chronic disease - Hemoglobin currently stable    Hyponatremia - Most likely due to poor oral solute intake. Currently improving and suspect continued improvement with improvement in oral intake. Will advance diet today.    Protein-calorie malnutrition, severe - Will liberalize diet today    Abdominal pain, unspecified site - Resolving on broad spectrum antibiotics. - Reportedly IR not planning any procedures today    History of DVT (deep vein thrombosis) - Patient is currently on xarelto  Code Status: full Family Communication: No family at bedside Disposition Plan: Pending continued improvement in condition.   Consultants:  Radiology  Procedures:  None  Antibiotics:  Patient is on vancomycin and Zosyn  HPI/Subjective: No new complaints. No acute issues overnight. Patient feels better. Orally concern is right drain at insertion site is leaking.  Objective: Filed Vitals:   05/05/13 1400  BP: 93/57  Pulse: 100  Temp: 98 F (36.7 C)  Resp: 16    Intake/Output Summary (Last 24 hours) at 05/05/13 1444 Last data filed at 05/05/13 1333  Gross per 24 hour  Intake 1043.33 ml  Output   1595 ml  Net -551.67 ml   Filed Weights   05/04/13 1931 05/04/13 2342  Weight: 72.576 kg (160 lb) 71.578 kg (157 lb 12.8 oz)    Exam:   General:  Pt in NAD, alert and awake  Cardiovascular: RRR, no MRG  Respiratory: CTA BL, no  wheezes  Abdomen: 2 drains in place with right lateral one having bile drainage at insertion site.   Musculoskeletal: no cyanosis or clubbing   Data Reviewed: Basic Metabolic Panel:  Recent Labs Lab 05/03/13 1501 05/04/13 2041 05/05/13 0355  NA 136 129* 130*  K 3.6 3.6* 3.2*  CL  --  96 97  CO2 23 20 22   GLUCOSE 101 111* 88  BUN 11.5 12 10   CREATININE 0.7 0.71 0.58  CALCIUM 8.8 8.4 7.9*  MG  --   --  1.6  PHOS  --   --  2.0*   Liver Function Tests:  Recent Labs Lab 05/03/13 1501 05/04/13 2041 05/05/13 0355  AST 29 25 21   ALT 32 27 24  ALKPHOS 639* 601* 484*  BILITOT 4.41* 4.5* 4.0*  PROT 6.5 6.4 5.3*  ALBUMIN 2.1* 1.9* 1.7*    Recent Labs Lab 05/04/13 2041  LIPASE 9*   No results found for this basename: AMMONIA,  in the last 168 hours CBC:  Recent Labs Lab 05/03/13 1501 05/04/13 2041 05/05/13 0355  WBC 6.7 8.5 6.2  NEUTROABS 5.2 6.9  --   HGB 10.2* 9.5* 8.4*  HCT 30.9* 28.1* 25.7*  MCV 92.8 90.9 91.8  PLT 332 280 237   Cardiac Enzymes: No results found for this basename: CKTOTAL, CKMB, CKMBINDEX, TROPONINI,  in the last 168 hours BNP (last 3 results) No results found for this basename: PROBNP,  in the last 8760 hours CBG: No results found for this basename: GLUCAP,  in the last 168 hours  No results found for this  or any previous visit (from the past 240 hour(s)).   Studies: Dg Chest 2 View  05/04/2013   CLINICAL DATA:  Fever for the past several days.  Jaundice.  EXAM: CHEST  2 VIEW  COMPARISON:  Chest x-ray 07/29/2012.  FINDINGS: Right internal jugular single-lumen power porta cath with tip terminating in the distal superior vena cava. Lung volumes are low. Linear opacity in the left mid lung is unchanged, compatible with an area of scarring. Linear bibasilar opacities may reflect additional areas of mild scarring or subsegmental atelectasis. No consolidative airspace disease. No definite pleural effusions. No evidence of pulmonary edema. Heart  size is normal. Mediastinal contours are unremarkable. Atherosclerosis in the thoracic aorta. 2 percutaneous biliary drains are seen projecting over the upper abdomen. Numerous surgical clips are present in the right upper quadrant, compatible with prior cholecystectomy. Post vertebroplasty changes are noted in the upper lumbar spine.  IMPRESSION: 1. No radiographic evidence of acute cardiopulmonary disease. The appearance the chest is similar to prior studies, as above.   Electronically Signed   By: Vinnie Langton M.D.   On: 05/04/2013 21:25   Ct Abdomen Pelvis W Contrast  05/05/2013   CLINICAL DATA:  Evaluate for intra-abdominal abscess  EXAM: CT ABDOMEN AND PELVIS WITH CONTRAST  TECHNIQUE: Multidetector CT imaging of the abdomen and pelvis was performed using the standard protocol following bolus administration of intravenous contrast.  CONTRAST:  28mL OMNIPAQUE IOHEXOL 300 MG/ML SOLN, 139mL OMNIPAQUE IOHEXOL 300 MG/ML SOLN  COMPARISON:  04/05/2013  FINDINGS: BODY WALL: Venous collaterals on the left lower abdomen and upper thigh, chronically occluded left external iliac vein.  LOWER CHEST: Cardiomegaly.  Bibasilar atelectasis.  ABDOMEN/PELVIS:  Liver: Diffuse hepatic metastatic disease which shows no interval change from prior. The largest mass is in the central liver, measuring up to 7 cm. No new hepatic abnormality to suggest liver abscess. There are attenuation differences around multiple peripheral locations in the liver, including right-sided biliary drain, likely reactive. No evidence of acute vascular occlusion.  Improved biliary ductal dilatation status post bilateral biliary drain placement. Both catheters reached the duodenum. No undrained segments suspected.  Pancreas: Unremarkable.  Spleen: Unchanged mild enlargement  Adrenals: Unremarkable.  Kidneys and ureters: Unchanged atrophic, nonenhancing left kidney. Appearance consistent with long-standing vascular compromise, likely related to mass  circumferentially and narrowing the left renal artery.  Bladder: Unremarkable.  Reproductive: Radical hysterectomy and bilateral salpingectomy and oophorectomies.  Bowel: No obstruction. Appendicolith without appendicitis.  Retroperitoneum: Retroperitoneal nodal metastatic disease is unchanged from prior. Index right crural node continues to measure 12 mm short axis. Cystic left node of [place], unchanged at 20 x 34 mm.  Peritoneum: Small volume of free fluid, mildly increased in the pelvic recesses. No loculated collection.  Vascular: IVC filter. 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  OSSEOUS: L1, L2, L3, and L4 superior endplate insufficiency fractures status post bone augmentation at L1 and L3. No acute fracture identified. No evidence of osseous infection.  IMPRESSION: 1. No intra-abdominal abscess or other definitive source of intra-abdominal infection. 2. Decreased biliary dilatation after bilateral biliary drain placement. 3. Intra-abdominal metastatic disease, recently staged (04/05/2013).   Electronically Signed   By: Jorje Guild M.D.   On: 05/05/2013 02:05    Scheduled Meds: . citalopram  20 mg Oral BID  . diltiazem  180 mg Oral Daily  . docusate sodium  100 mg Oral BID  . fentaNYL  100 mcg Transdermal Q72H  . loratadine  10 mg Oral Daily  .  metoprolol tartrate  75 mg Oral BID  . pantoprazole  40 mg Oral Daily  . phosphorus  250 mg Oral BID  . piperacillin-tazobactam (ZOSYN)  IV  3.375 g Intravenous 3 times per day  . potassium chloride  10 mEq Oral BID  . Rivaroxaban  20 mg Oral QPM  . sodium chloride  3 mL Intravenous Q12H  . vancomycin  1,000 mg Intravenous Q12H   Continuous Infusions: . sodium chloride 100 mL/hr at 05/05/13 0752     Time spent: > 35 minutes    Velvet Bathe  [facility] Pager [phone] If 7PM-7AM, please contact night-coverage at [email or web address], password [facility] 05/05/2013, 2:44 PM  LOS: 1 day

## 2013-05-06 DIAGNOSIS — R7881 Bacteremia: Secondary | ICD-10-CM

## 2013-05-06 HISTORY — DX: Bacteremia: R78.81

## 2013-05-06 LAB — VANCOMYCIN, TROUGH: Vancomycin Tr: 17.4 ug/mL (ref 10.0–20.0)

## 2013-05-06 LAB — PROCALCITONIN: Procalcitonin: 0.55 ng/mL

## 2013-05-06 MED ORDER — METOPROLOL TARTRATE 25 MG PO TABS
25.0000 mg | ORAL_TABLET | Freq: Two times a day (BID) | ORAL | Status: DC
  Administered 2013-05-06 – 2013-05-14 (×16): 25 mg via ORAL
  Filled 2013-05-06 (×22): qty 1

## 2013-05-06 NOTE — Progress Notes (Signed)
ANTIBIOTIC CONSULT NOTE - follow up  Pharmacy Consult for Vancomycin Indication: intra-abdom infection  Allergies  Allergen Reactions  . Codeine Other (See Comments)    Breathing issues  . Flecainide Acetate Other (See Comments)    Leg cramping   . Morphine Other (See Comments)     Hallucinations  . Propafenone Hcl Other (See Comments)    Leg cramps   Patient Measurements: Height: 5\' 1"  (154.9 cm) Weight: 157 lb 12.8 oz (71.578 kg) IBW/kg (Calculated) : 47.8  Vital Signs: Temp: 97.7 F (36.5 C) (03/08 0510) Temp src: Oral (03/08 0510) BP: 92/60 mmHg (03/08 0510) Pulse Rate: 75 (03/08 0510) Intake/Output from previous day: 03/07 0701 - 03/08 0700 In: 3378.3 [P.O.:1200; I.V.:1878.3; IV Piggyback:300] Out: 4259 [Urine:1850; Drains:1195; Stool:1] Intake/Output from this shift:    Labs:  Recent Labs  05/03/13 1501 05/03/13 1501 05/04/13 2041 05/05/13 0124 05/05/13 0355  WBC 6.7  --  8.5  --  6.2  HGB 10.2*  --  9.5*  --  8.4*  PLT 332  --  280  --  237  LABCREA  --   --   --  63.4  --   CREATININE  --  0.7 0.71  --  0.58   Estimated Creatinine Clearance: 66.8 ml/min (by C-G formula based on Cr of 0.58). No results found for this basename: VANCOTROUGH, Corlis Leak, VANCORANDOM, [place], G[place]PEAK, GENTRANDOM, [place], TOBRAPEAK, TOBRARND, AMIKACINPEAK, AMIKACINTROU, AMIKACIN,  in the last 72 hours   Microbiology: Recent Results (from the past 720 hour(s))  URINE CULTURE     Status: None   Collection Time    04/16/13  1:00 PM      Result Value Ref Range Status   Urine Culture, Routine Culture, Urine   Final   Comment: Final - ===== COLONY COUNT: =====     NO GROWTH     NO GROWTH  CULTURE, BLOOD (ROUTINE X 2)     Status: None   Collection Time    05/04/13  8:41 PM      Result Value Ref Range Status   Specimen Description BLOOD LEFT ANTECUBITAL   Final   Special Requests BOTTLES DRAWN AEROBIC AND ANAEROBIC 8ML   Final   Culture  Setup Time     Final   Value: 05/05/2013 01:09     Performed at [facility]   Culture     Final   Value: GRAM NEGATIVE RODS     Note: Gram Stain Report Called to,Read Back By and Verified With: Lincoln HUFF 05/05/13 @ 6:28PM BY RUSCOE A.     Performed at [facility]   Report Status PENDING   Incomplete  CULTURE, BLOOD (ROUTINE X 2)     Status: None   Collection Time    05/04/13  9:40 PM      Result Value Ref Range Status   Specimen Description BLOOD RIGHT PORTA CATH   Final   Special Requests BOTTLES DRAWN AEROBIC AND ANAEROBIC 5CC   Final   Culture  Setup Time     Final   Value: 05/05/2013 01:09     Performed at [facility]   Culture     Final   Value: GRAM NEGATIVE RODS     Note: Gram Stain Report Called to,Read Back By and Verified With: [name] 05/05/13 @ 6:28PM BY RUSCOE A.     Performed at [facility]   Report Status PENDING   Incomplete   Assessment: 63yo F with hx  of Cervical Cancer with metastatic spread to liver with biliary obstruction now sp stenting presents with fever and redness around the site on Right drain insertion.   3/7 >> Zosyn >> 3/7 >> Vanc >>  Tmax: AF WBCs: wnl Renal: wnl, 67CG  3/6 blood x2: GNR in both  Goal of Therapy:  Vancomycin trough level 15-20 mcg/ml  Plan:   Cont Vanc 1g IV q12h.  Also on Zosyn 3.375g IV Q8H infused over 4hrs.  Check Vanc trough tonight before 5th dose.    Follow up renal fxn and culture results.  Romeo Rabon, PharmD, pager [phone]. 05/06/2013,10:09 AM.

## 2013-05-06 NOTE — Progress Notes (Signed)
Subjective: Feeling better.  No significant abdominal pain.  Blood cx positive for gram neg rods.  Objective: Vital signs in last 24 hours: Temp:  [97.7 F (36.5 C)-99.1 F (37.3 C)] 97.9 F (36.6 C) (03/08 1103) Pulse Rate:  [75-105] 92 (03/08 1103) Resp:  [16-20] 18 (03/08 1103) BP: (92-118)/(57-73) 96/64 mmHg (03/08 1103) SpO2:  [99 %-100 %] 100 % (03/08 1103) Last BM Date: 05/04/13  Intake/Output from previous day: 03/07 0701 - 03/08 0700 In: 3378.3 [P.O.:1200; I.V.:1878.3; IV Piggyback:300] Out: 5361 [Urine:1850; Drains:1195; Stool:1] Intake/Output this shift: Total I/O In: 360 [P.O.:360] Out: -   Biliary tube sites clean.  Minimal bile on current right hepatic biliary drain.  Stat lock removed and catheter redressed now at bedside.  No erythema or purulent discharge.  Lab Results:   Recent Labs  05/04/13 2041 05/05/13 0355  WBC 8.5 6.2  HGB 9.5* 8.4*  HCT 28.1* 25.7*  PLT 280 237   BMET  Recent Labs  05/04/13 2041 05/05/13 0355  NA 129* 130*  K 3.6* 3.2*  CL 96 97  CO2 20 22  GLUCOSE 111* 88  BUN 12 10  CREATININE 0.71 0.58  CALCIUM 8.4 7.9*   PT/INR  Recent Labs  05/05/13 0020  LABPROT 36.3*  INR 3.84*   ABG No results found for this basename: PHART, PCO2, PO2, HCO3,  in the last 72 hours  Studies/Results: Dg Chest 2 View  05/04/2013   CLINICAL DATA:  Fever for the past several days.  Jaundice.  EXAM: CHEST  2 VIEW  COMPARISON:  Chest x-ray 07/29/2012.  FINDINGS: Right internal jugular single-lumen power porta cath with tip terminating in the distal superior vena cava. Lung volumes are low. Linear opacity in the left mid lung is unchanged, compatible with an area of scarring. Linear bibasilar opacities may reflect additional areas of mild scarring or subsegmental atelectasis. No consolidative airspace disease. No definite pleural effusions. No evidence of pulmonary edema. Heart size is normal. Mediastinal contours are unremarkable.  Atherosclerosis in the thoracic aorta. 2 percutaneous biliary drains are seen projecting over the upper abdomen. Numerous surgical clips are present in the right upper quadrant, compatible with prior cholecystectomy. Post vertebroplasty changes are noted in the upper lumbar spine.  IMPRESSION: 1. No radiographic evidence of acute cardiopulmonary disease. The appearance the chest is similar to prior studies, as above.   Electronically Signed   By: Vinnie Langton M.D.   On: 05/04/2013 21:25   Ct Abdomen Pelvis W Contrast  05/05/2013   CLINICAL DATA:  Evaluate for intra-abdominal abscess  EXAM: CT ABDOMEN AND PELVIS WITH CONTRAST  TECHNIQUE: Multidetector CT imaging of the abdomen and pelvis was performed using the standard protocol following bolus administration of intravenous contrast.  CONTRAST:  56mL OMNIPAQUE IOHEXOL 300 MG/ML SOLN, 132mL OMNIPAQUE IOHEXOL 300 MG/ML SOLN  COMPARISON:  04/05/2013  FINDINGS: BODY WALL: Venous collaterals on the left lower abdomen and upper thigh, chronically occluded left external iliac vein.  LOWER CHEST: Cardiomegaly.  Bibasilar atelectasis.  ABDOMEN/PELVIS:  Liver: Diffuse hepatic metastatic disease which shows no interval change from prior. The largest mass is in the central liver, measuring up to 7 cm. No new hepatic abnormality to suggest liver abscess. There are attenuation differences around multiple peripheral locations in the liver, including right-sided biliary drain, likely reactive. No evidence of acute vascular occlusion.  Improved biliary ductal dilatation status post bilateral biliary drain placement. Both catheters reached the duodenum. No undrained segments suspected.  Pancreas: Unremarkable.  Spleen: Unchanged mild  enlargement  Adrenals: Unremarkable.  Kidneys and ureters: Unchanged atrophic, nonenhancing left kidney. Appearance consistent with long-standing vascular compromise, likely related to mass circumferentially and narrowing the left renal artery.   Bladder: Unremarkable.  Reproductive: Radical hysterectomy and bilateral salpingectomy and oophorectomies.  Bowel: No obstruction. Appendicolith without appendicitis.  Retroperitoneum: Retroperitoneal nodal metastatic disease is unchanged from prior. Index right crural node continues to measure 12 mm short axis. Cystic left node of [place], unchanged at 20 x 34 mm.  Peritoneum: Small volume of free fluid, mildly increased in the pelvic recesses. No loculated collection.  Vascular: IVC filter. 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  OSSEOUS: L1, L2, L3, and L4 superior endplate insufficiency fractures status post bone augmentation at L1 and L3. No acute fracture identified. No evidence of osseous infection.  IMPRESSION: 1. No intra-abdominal abscess or other definitive source of intra-abdominal infection. 2. Decreased biliary dilatation after bilateral biliary drain placement. 3. Intra-abdominal metastatic disease, recently staged (04/05/2013).   Electronically Signed   By: Jorje Guild M.D.   On: 05/05/2013 02:05    Anti-infectives: Anti-infectives   Start     Dose/Rate Route Frequency Ordered Stop   05/05/13 1000  vancomycin (VANCOCIN) IVPB 1000 mg/200 mL premix     1,000 mg 200 mL/hr over 60 Minutes Intravenous Every 12 hours 05/05/13 0344     05/05/13 0000  vancomycin (VANCOCIN) IVPB 1000 mg/200 mL premix     1,000 mg 200 mL/hr over 60 Minutes Intravenous  Once 05/04/13 2355 05/05/13 0124   05/04/13 2345  piperacillin-tazobactam (ZOSYN) IVPB 3.375 g     3.375 g 12.5 mL/hr over 240 Minutes Intravenous 3 times per day 05/04/13 2336        Assessment/Plan: s/p Prior placement of both right and left lobe percutaneous int/ext biliary drains.  Now admitted with gram negative sepsis.  No significant biliary leakage at right drain exit site this AM.  Will continue to follow and may inject drains +/- exchange right drain this admission.  Continue abx Rx per [facility].   LOS: 2  days    Netanya Yazdani T 05/06/2013

## 2013-05-06 NOTE — Progress Notes (Signed)
[facility] PROGRESS NOTE  [name] XBD:[id number] DOB: [birth year] DOA: 05/04/2013 PCP: Gwendolyn Grant, MD  Assessment/Plan:  Sepsis: Recent gram negative bacteremia - Will continue broad spectrum antibiotics at this juncture - Will continue to monitor closely - decrease B blocker dose given soft blood pressures  Active Problems:   Atrial fibrillation - At this point we'll continue metoprolol and diltiazem. -Currently patient is on xarelto    Cervical cancer - Will consider notifying oncologist patient's hospitalization the patient is still here on Monday.    Anemia of chronic disease - Hemoglobin currently stable    Hyponatremia - Most likely due to poor oral solute intake. Currently improving and suspect continued improvement with improvement in oral intake.  - reassess next am with BMP.    Protein-calorie malnutrition, severe - Will liberalize diet today    Abdominal pain, unspecified site - Resolving on broad spectrum antibiotics. - Reportedly IR not planning any procedures today    History of DVT (deep vein thrombosis) - Patient is currently on xarelto  Code Status: full Family Communication: No family at bedside Disposition Plan: Pending continued improvement in condition.   Consultants:  Radiology  Procedures:  None  Antibiotics:  Patient is on vancomycin and Zosyn  HPI/Subjective: No new complaints. No acute issues overnight.   Objective: Filed Vitals:   05/06/13 0510  BP: 92/60  Pulse: 75  Temp: 97.7 F (36.5 C)  Resp: 18    Intake/Output Summary (Last 24 hours) at 05/06/13 0907 Last data filed at 05/06/13 0700  Gross per 24 hour  Intake   2675 ml  Output   3046 ml  Net   -371 ml   Filed Weights   05/04/13 1931 05/04/13 2342  Weight: 72.576 kg (160 lb) 71.578 kg (157 lb 12.8 oz)    Exam:   General:  Pt in NAD, alert and awake  Cardiovascular: RRR, no MRG  Respiratory: CTA BL, no wheezes  Abdomen: 2 drains in  place with right lateral one having bile drainage at insertion site.   Musculoskeletal: no cyanosis or clubbing   Data Reviewed: Basic Metabolic Panel:  Recent Labs Lab 05/03/13 1501 05/04/13 2041 05/05/13 0355  NA 136 129* 130*  K 3.6 3.6* 3.2*  CL  --  96 97  CO2 23 20 22   GLUCOSE 101 111* 88  BUN 11.5 12 10   CREATININE 0.7 0.71 0.58  CALCIUM 8.8 8.4 7.9*  MG  --   --  1.6  PHOS  --   --  2.0*   Liver Function Tests:  Recent Labs Lab 05/03/13 1501 05/04/13 2041 05/05/13 0355  AST 29 25 21   ALT 32 27 24  ALKPHOS 639* 601* 484*  BILITOT 4.41* 4.5* 4.0*  PROT 6.5 6.4 5.3*  ALBUMIN 2.1* 1.9* 1.7*    Recent Labs Lab 05/04/13 2041  LIPASE 9*   No results found for this basename: AMMONIA,  in the last 168 hours CBC:  Recent Labs Lab 05/03/13 1501 05/04/13 2041 05/05/13 0355  WBC 6.7 8.5 6.2  NEUTROABS 5.2 6.9  --   HGB 10.2* 9.5* 8.4*  HCT 30.9* 28.1* 25.7*  MCV 92.8 90.9 91.8  PLT 332 280 237   Cardiac Enzymes: No results found for this basename: CKTOTAL, CKMB, CKMBINDEX, TROPONINI,  in the last 168 hours BNP (last 3 results) No results found for this basename: PROBNP,  in the last 8760 hours CBG: No results found for this basename: GLUCAP,  in the last 168 hours  Recent Results (from the past 240 hour(s))  CULTURE, BLOOD (ROUTINE X 2)     Status: None   Collection Time    05/04/13  8:41 PM      Result Value Ref Range Status   Specimen Description BLOOD LEFT ANTECUBITAL   Final   Special Requests BOTTLES DRAWN AEROBIC AND ANAEROBIC 8ML   Final   Culture  Setup Time     Final   Value: 05/05/2013 01:09     Performed at [facility]   Culture     Final   Value: GRAM NEGATIVE RODS     Note: Gram Stain Report Called to,Read Back By and Verified With: Sheneika HUFF 05/05/13 @ 6:28PM BY RUSCOE A.     Performed at [facility]   Report Status PENDING   Incomplete  CULTURE, BLOOD (ROUTINE X 2)     Status: None   Collection Time     05/04/13  9:40 PM      Result Value Ref Range Status   Specimen Description BLOOD RIGHT PORTA CATH   Final   Special Requests BOTTLES DRAWN AEROBIC AND ANAEROBIC 5CC   Final   Culture  Setup Time     Final   Value: 05/05/2013 01:09     Performed at [facility]   Culture     Final   Value: GRAM NEGATIVE RODS     Note: Gram Stain Report Called to,Read Back By and Verified With: [name] 05/05/13 @ 6:28PM BY RUSCOE A.     Performed at [facility]   Report Status PENDING   Incomplete     Studies: Dg Chest 2 View  05/04/2013   CLINICAL DATA:  Fever for the past several days.  Jaundice.  EXAM: CHEST  2 VIEW  COMPARISON:  Chest x-ray 07/29/2012.  FINDINGS: Right internal jugular single-lumen power porta cath with tip terminating in the distal superior vena cava. Lung volumes are low. Linear opacity in the left mid lung is unchanged, compatible with an area of scarring. Linear bibasilar opacities may reflect additional areas of mild scarring or subsegmental atelectasis. No consolidative airspace disease. No definite pleural effusions. No evidence of pulmonary edema. Heart size is normal. Mediastinal contours are unremarkable. Atherosclerosis in the thoracic aorta. 2 percutaneous biliary drains are seen projecting over the upper abdomen. Numerous surgical clips are present in the right upper quadrant, compatible with prior cholecystectomy. Post vertebroplasty changes are noted in the upper lumbar spine.  IMPRESSION: 1. No radiographic evidence of acute cardiopulmonary disease. The appearance the chest is similar to prior studies, as above.   Electronically Signed   By: Vinnie Langton M.D.   On: 05/04/2013 21:25   Ct Abdomen Pelvis W Contrast  05/05/2013   CLINICAL DATA:  Evaluate for intra-abdominal abscess  EXAM: CT ABDOMEN AND PELVIS WITH CONTRAST  TECHNIQUE: Multidetector CT imaging of the abdomen and pelvis was performed using the standard protocol following bolus administration of  intravenous contrast.  CONTRAST:  42mL OMNIPAQUE IOHEXOL 300 MG/ML SOLN, 187mL OMNIPAQUE IOHEXOL 300 MG/ML SOLN  COMPARISON:  04/05/2013  FINDINGS: BODY WALL: Venous collaterals on the left lower abdomen and upper thigh, chronically occluded left external iliac vein.  LOWER CHEST: Cardiomegaly.  Bibasilar atelectasis.  ABDOMEN/PELVIS:  Liver: Diffuse hepatic metastatic disease which shows no interval change from prior. The largest mass is in the central liver, measuring up to 7 cm. No new hepatic abnormality to suggest liver abscess. There are attenuation differences around multiple  peripheral locations in the liver, including right-sided biliary drain, likely reactive. No evidence of acute vascular occlusion.  Improved biliary ductal dilatation status post bilateral biliary drain placement. Both catheters reached the duodenum. No undrained segments suspected.  Pancreas: Unremarkable.  Spleen: Unchanged mild enlargement  Adrenals: Unremarkable.  Kidneys and ureters: Unchanged atrophic, nonenhancing left kidney. Appearance consistent with long-standing vascular compromise, likely related to mass circumferentially and narrowing the left renal artery.  Bladder: Unremarkable.  Reproductive: Radical hysterectomy and bilateral salpingectomy and oophorectomies.  Bowel: No obstruction. Appendicolith without appendicitis.  Retroperitoneum: Retroperitoneal nodal metastatic disease is unchanged from prior. Index right crural node continues to measure 12 mm short axis. Cystic left node of [place], unchanged at 20 x 34 mm.  Peritoneum: Small volume of free fluid, mildly increased in the pelvic recesses. No loculated collection.  Vascular: IVC filter. 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  OSSEOUS: L1, L2, L3, and L4 superior endplate insufficiency fractures status post bone augmentation at L1 and L3. No acute fracture identified. No evidence of osseous infection.  IMPRESSION: 1. No intra-abdominal abscess or other  definitive source of intra-abdominal infection. 2. Decreased biliary dilatation after bilateral biliary drain placement. 3. Intra-abdominal metastatic disease, recently staged (04/05/2013).   Electronically Signed   By: Jorje Guild M.D.   On: 05/05/2013 02:05    Scheduled Meds: . citalopram  20 mg Oral BID  . diltiazem  180 mg Oral Daily  . docusate sodium  100 mg Oral BID  . fentaNYL  100 mcg Transdermal Q72H  . loratadine  10 mg Oral Daily  . metoprolol tartrate  25 mg Oral BID  . pantoprazole  40 mg Oral Daily  . piperacillin-tazobactam (ZOSYN)  IV  3.375 g Intravenous 3 times per day  . potassium chloride  10 mEq Oral BID  . Rivaroxaban  20 mg Oral QPM  . sodium chloride  3 mL Intravenous Q12H  . vancomycin  1,000 mg Intravenous Q12H   Continuous Infusions: . sodium chloride 100 mL/hr at 05/06/13 0721     Time spent: > 35 minutes    Velvet Bathe  [facility] Pager [phone] If 7PM-7AM, please contact night-coverage at [email or web address], password [facility] 05/06/2013, 9:07 AM  LOS: 2 days

## 2013-05-07 ENCOUNTER — Other Ambulatory Visit (HOSPITAL_COMMUNITY): Payer: 59

## 2013-05-07 LAB — CBC
HCT: 24.8 % — ABNORMAL LOW (ref 36.0–46.0)
Hemoglobin: 8.3 g/dL — ABNORMAL LOW (ref 12.0–15.0)
MCH: 30.7 pg (ref 26.0–34.0)
MCHC: 33.5 g/dL (ref 30.0–36.0)
MCV: 91.9 fL (ref 78.0–100.0)
PLATELETS: 252 10*3/uL (ref 150–400)
RBC: 2.7 MIL/uL — AB (ref 3.87–5.11)
RDW: 13.9 % (ref 11.5–15.5)
WBC: 5.3 10*3/uL (ref 4.0–10.5)

## 2013-05-07 LAB — CULTURE, BLOOD (ROUTINE X 2)

## 2013-05-07 LAB — BASIC METABOLIC PANEL
BUN: 5 mg/dL — ABNORMAL LOW (ref 6–23)
CALCIUM: 7.7 mg/dL — AB (ref 8.4–10.5)
CO2: 22 meq/L (ref 19–32)
Chloride: 102 mEq/L (ref 96–112)
Creatinine, Ser: 0.59 mg/dL (ref 0.50–1.10)
GFR calc Af Amer: >90 mL/min (ref >90)
GFR calc non Af Amer: >90 mL/min (ref >90)
Glucose, Bld: 92 mg/dL (ref 70–99)
Potassium: 3.3 mEq/L — ABNORMAL LOW (ref 3.7–5.3)
Sodium: 135 mEq/L — ABNORMAL LOW (ref 137–147)

## 2013-05-07 MED ORDER — FENTANYL 100 MCG/HR TD PT72
100.0000 ug | MEDICATED_PATCH | TRANSDERMAL | Status: DC
  Administered 2013-05-07: 100 ug via TRANSDERMAL
  Filled 2013-05-07: qty 1

## 2013-05-07 MED ORDER — FENTANYL 100 MCG/HR TD PT72
200.0000 ug | MEDICATED_PATCH | TRANSDERMAL | Status: DC
  Administered 2013-05-07: 100 ug via TRANSDERMAL
  Administered 2013-05-10 – 2013-05-13 (×2): 200 ug via TRANSDERMAL
  Filled 2013-05-07 (×3): qty 2

## 2013-05-07 MED ORDER — DEXTROSE 5 % IV SOLN
1.0000 g | Freq: Two times a day (BID) | INTRAVENOUS | Status: DC
  Administered 2013-05-07 – 2013-05-14 (×14): 1 g via INTRAVENOUS
  Filled 2013-05-07 (×15): qty 1

## 2013-05-07 MED ORDER — POTASSIUM CHLORIDE CRYS ER 20 MEQ PO TBCR
20.0000 meq | EXTENDED_RELEASE_TABLET | Freq: Once | ORAL | Status: AC
  Administered 2013-05-07: 20 meq via ORAL
  Filled 2013-05-07: qty 1

## 2013-05-07 NOTE — Progress Notes (Signed)
Subjective: Patient states she is feeling worse today, right upper abdomen 8/10 pain. She denies any fever or chills. She states right biliary drain dressing changes are helping. Blood cultures (+) SERRATIA MARCESCENS  Objective: Physical Exam: BP 115/70  Pulse 97  Temp(Src) 97.9 F (36.6 C) (Oral)  Resp 16  Ht 5\' 1"  (1.549 m)  Wt 157 lb 12.8 oz (71.578 kg)  BMI 29.83 kg/m2  SpO2 99%  Right biliary drain tender, good bilious output, dressing with light amount of bile saturation, skin with bile stain. Skin site slight erythematous directly around site, no breakdown.  Left biliary drain dressing C/D/I, NT, good bilious output.  Labs: CBC  Recent Labs  05/05/13 0355 05/07/13 0430  WBC 6.2 5.3  HGB 8.4* 8.3*  HCT 25.7* 24.8*  PLT 237 252   BMET  Recent Labs  05/05/13 0355 05/07/13 0430  NA 130* 135*  K 3.2* 3.3*  CL 97 102  CO2 22 22  GLUCOSE 88 92  BUN 10 5*  CREATININE 0.58 0.59  CALCIUM 7.9* 7.7*   LFT  Recent Labs  05/04/13 2041 05/05/13 0355  PROT 6.4 5.3*  ALBUMIN 1.9* 1.7*  AST 25 21  ALT 27 24  ALKPHOS 601* 484*  BILITOT 4.5* 4.0*  LIPASE 9*  --    PT/INR  Recent Labs  05/05/13 0020  LABPROT 36.3*  INR 3.84*     Studies/Results: No results found.  Assessment/Plan: Metastatic cervical cancer with biliary obstruction s/p 2/27 left biliary drain exchange and right biliary drain placement.  Sepsis, on IV antibiotics, afebrile today, wbc 5.3  IR will continue to follow patient, will change right biliary drain dressing QID.  Plan for IR drain evaluation for persistent leaking once patient is stable and over acute phase.   LOS: 3 days    Hedy Jacob PA-C 05/07/2013 11:50 AM

## 2013-05-07 NOTE — Progress Notes (Signed)
ANTIBIOTIC CONSULT NOTE - FOLLOW UP  Pharmacy Consult for Vancomycin Indication: Intra-abdom infection  Allergies  Allergen Reactions  . Codeine Other (See Comments)    Breathing issues  . Flecainide Acetate Other (See Comments)    Leg cramping   . Morphine Other (See Comments)     Hallucinations  . Propafenone Hcl Other (See Comments)    Leg cramps    Patient Measurements: Height: 5\' 1"  (154.9 cm) Weight: 157 lb 12.8 oz (71.578 kg) IBW/kg (Calculated) : 47.8 Adjusted Body Weight:   Vital Signs: Temp: 98.5 F (36.9 C) (03/08 2114) Temp src: Oral (03/08 2114) BP: 111/59 mmHg (03/08 2114) Pulse Rate: 97 (03/08 2114) Intake/Output from previous day: 03/08 0701 - 03/09 0700 In: 1210 [P.O.:960; IV Piggyback:250] Out: 1325 [Urine:500; Drains:825] Intake/Output from this shift: Total I/O In: 490 [P.O.:240; IV Piggyback:250] Out: 750 [Urine:500; Drains:250]  Labs:  Recent Labs  05/04/13 2041 05/05/13 0124 05/05/13 0355  WBC 8.5  --  6.2  HGB 9.5*  --  8.4*  PLT 280  --  237  LABCREA  --  63.4  --   CREATININE 0.71  --  0.58   Estimated Creatinine Clearance: 66.8 ml/min (by C-G formula based on Cr of 0.58).  Recent Labs  05/06/13 2130  [place] 17.4     Microbiology: Recent Results (from the past 720 hour(s))  URINE CULTURE     Status: None   Collection Time    04/16/13  1:00 PM      Result Value Ref Range Status   Urine Culture, Routine Culture, Urine   Final   Comment: Final - ===== COLONY COUNT: =====     NO GROWTH     NO GROWTH  CULTURE, BLOOD (ROUTINE X 2)     Status: None   Collection Time    05/04/13  8:41 PM      Result Value Ref Range Status   Specimen Description BLOOD LEFT ANTECUBITAL   Final   Special Requests BOTTLES DRAWN AEROBIC AND ANAEROBIC 8ML   Final   Culture  Setup Time     Final   Value: 05/05/2013 01:09     Performed at [facility]   Culture     Final   Value: GRAM NEGATIVE RODS     Note: Gram Stain Report  Called to,Read Back By and Verified With: Koralee HUFF 05/05/13 @ 6:28PM BY RUSCOE A.     Performed at [facility]   Report Status PENDING   Incomplete  CULTURE, BLOOD (ROUTINE X 2)     Status: None   Collection Time    05/04/13  9:40 PM      Result Value Ref Range Status   Specimen Description BLOOD RIGHT PORTA CATH   Final   Special Requests BOTTLES DRAWN AEROBIC AND ANAEROBIC 5CC   Final   Culture  Setup Time     Final   Value: 05/05/2013 01:09     Performed at [facility]   Culture     Final   Value: GRAM NEGATIVE RODS     Note: Gram Stain Report Called to,Read Back By and Verified With: [name] 05/05/13 @ 6:28PM BY RUSCOE A.     Performed at [facility]   Report Status PENDING   Incomplete    Anti-infectives   Start     Dose/Rate Route Frequency Ordered Stop   05/05/13 1000  vancomycin (VANCOCIN) IVPB 1000 mg/200 mL premix     1,000  mg 200 mL/hr over 60 Minutes Intravenous Every 12 hours 05/05/13 0344     05/05/13 0000  vancomycin (VANCOCIN) IVPB 1000 mg/200 mL premix     1,000 mg 200 mL/hr over 60 Minutes Intravenous  Once 05/04/13 2355 05/05/13 0124   05/04/13 2345  piperacillin-tazobactam (ZOSYN) IVPB 3.375 g     3.375 g 12.5 mL/hr over 240 Minutes Intravenous 3 times per day 05/04/13 2336        Assessment: Patient with vancomycin level at goal.  Goal of Therapy:  Vancomycin trough level 15-20 mcg/ml  Plan:  Measure antibiotic drug levels at steady state Follow up culture results Continue vancomycin at current dose, recheck levels at least weekly  Tyler Deis, Shea Stakes [place] 05/07/2013,12:04 AM

## 2013-05-07 NOTE — Progress Notes (Signed)
[facility] PROGRESS NOTE  [name]:[id number] DOB: 10-May-1950 DOA: 05/04/2013 PCP: Gwendolyn Grant, MD  Assessment/Plan:  Sepsis/ gram negative bacteremia - Will continue broad spectrum antibiotics at this juncture - Pt growing: Kamas ID for further evaluation and recommendations  Active Problems:   Atrial fibrillation - At this point we'll continue metoprolol and diltiazem. -Currently patient is on xarelto    Cervical cancer - Pt to follow up with oncologist on discharge    Anemia of chronic disease - Hemoglobin currently stable    Hyponatremia - Most likely due to poor oral solute intake. Currently improving and suspect continued improvement with improvement in oral intake.  - reassess next am with BMP.    Protein-calorie malnutrition, severe - Will liberalize diet today    Abdominal pain, unspecified site - Resolving on broad spectrum antibiotics. - Reportedly IR not planning any procedures today    History of DVT (deep vein thrombosis) - Patient is currently on xarelto  Code Status: full Family Communication: No family at bedside Disposition Plan: Pending continued improvement in condition.   Consultants:  Radiology  Procedures:  None  Antibiotics:  Patient is on vancomycin and Zosyn  HPI/Subjective: No new complaints. No acute issues overnight.   Objective: Filed Vitals:   05/07/13 1029  BP: 115/70  Pulse: 97  Temp: 97.9 F (36.6 C)  Resp: 16    Intake/Output Summary (Last 24 hours) at 05/07/13 1140 Last data filed at 05/07/13 5638  Gross per 24 hour  Intake   2110 ml  Output   3815 ml  Net  -1705 ml   Filed Weights   05/04/13 1931 05/04/13 2342  Weight: 72.576 kg (160 lb) 71.578 kg (157 lb 12.8 oz)    Exam:   General:  Pt in NAD, alert and awake  Cardiovascular: RRR, no MRG  Respiratory: CTA BL, no wheezes  Abdomen: 2 drains in place with right lateral one having bile drainage at  insertion site.   Musculoskeletal: no cyanosis or clubbing   Data Reviewed: Basic Metabolic Panel:  Recent Labs Lab 05/03/13 1501 05/04/13 2041 05/05/13 0355 05/07/13 0430  NA 136 129* 130* 135*  K 3.6 3.6* 3.2* 3.3*  CL  --  96 97 102  CO2 23 20 22 22   GLUCOSE 101 111* 88 92  BUN 11.5 12 10  5*  CREATININE 0.7 0.71 0.58 0.59  CALCIUM 8.8 8.4 7.9* 7.7*  MG  --   --  1.6  --   PHOS  --   --  2.0*  --    Liver Function Tests:  Recent Labs Lab 05/03/13 1501 05/04/13 2041 05/05/13 0355  AST 29 25 21   ALT 32 27 24  ALKPHOS 639* 601* 484*  BILITOT 4.41* 4.5* 4.0*  PROT 6.5 6.4 5.3*  ALBUMIN 2.1* 1.9* 1.7*    Recent Labs Lab 05/04/13 2041  LIPASE 9*   No results found for this basename: AMMONIA,  in the last 168 hours CBC:  Recent Labs Lab 05/03/13 1501 05/04/13 2041 05/05/13 0355 05/07/13 0430  WBC 6.7 8.5 6.2 5.3  NEUTROABS 5.2 6.9  --   --   HGB 10.2* 9.5* 8.4* 8.3*  HCT 30.9* 28.1* 25.7* 24.8*  MCV 92.8 90.9 91.8 91.9  PLT 332 280 237 252   Cardiac Enzymes: No results found for this basename: CKTOTAL, CKMB, CKMBINDEX, TROPONINI,  in the last 168 hours BNP (last 3 results) No results found for this basename: PROBNP,  in the last  8760 hours CBG: No results found for this basename: GLUCAP,  in the last 168 hours  Recent Results (from the past 240 hour(s))  CULTURE, BLOOD (ROUTINE X 2)     Status: None   Collection Time    05/04/13  8:41 PM      Result Value Ref Range Status   Specimen Description BLOOD LEFT ANTECUBITAL   Final   Special Requests BOTTLES DRAWN AEROBIC AND ANAEROBIC 8ML   Final   Culture  Setup Time     Final   Value: 05/05/2013 01:09     Performed at [facility]   Culture     Final   Value: SERRATIA MARCESCENS     Note: SUSCEPTIBILITIES PERFORMED ON PREVIOUS CULTURE WITHIN THE LAST 5 DAYS.     Note: Gram Stain Report Called to,Read Back By and Verified With: Chinyere HUFF 05/05/13 @ 6:28PM BY RUSCOE A.     Performed at  [facility]   Report Status 05/07/2013 FINAL   Final  CULTURE, BLOOD (ROUTINE X 2)     Status: None   Collection Time    05/04/13  9:40 PM      Result Value Ref Range Status   Specimen Description BLOOD RIGHT PORTA CATH   Final   Special Requests BOTTLES DRAWN AEROBIC AND ANAEROBIC 5CC   Final   Culture  Setup Time     Final   Value: 05/05/2013 01:09     Performed at [facility]   Culture     Final   Value: SERRATIA MARCESCENS     Note: Gram Stain Report Called to,Read Back By and Verified With: [name] 05/05/13 @ 6:28PM BY RUSCOE A.     Performed at [facility]   Report Status 05/07/2013 FINAL   Final   Organism ID, Bacteria SERRATIA MARCESCENS   Final     Studies: No results found.  Scheduled Meds: . citalopram  20 mg Oral BID  . diltiazem  180 mg Oral Daily  . docusate sodium  100 mg Oral BID  . fentaNYL  100 mcg Transdermal Q72H  . loratadine  10 mg Oral Daily  . metoprolol tartrate  25 mg Oral BID  . pantoprazole  40 mg Oral Daily  . piperacillin-tazobactam (ZOSYN)  IV  3.375 g Intravenous 3 times per day  . potassium chloride  10 mEq Oral BID  . Rivaroxaban  20 mg Oral QPM  . sodium chloride  3 mL Intravenous Q12H  . vancomycin  1,000 mg Intravenous Q12H   Continuous Infusions: . sodium chloride 100 mL/hr at 05/07/13 0531     Time spent: > 35 minutes    Velvet Bathe  [facility] Pager [phone] If 7PM-7AM, please contact night-coverage at [email or web address], password [email or web address] 05/07/2013, 11:40 AM  LOS: 3 days

## 2013-05-07 NOTE — Consult Note (Signed)
[facility] for Infectious Disease  Date of Admission:  05/04/2013  Date of Consult:  05/07/2013  Reason for Consult: Serratia bacteremia Referring Physician:  Wendee Beavers  Impression/Recommendation Serratia Bacteremia Cervical Ca, stage IV  Liver metastases, biliary obstruction  Would Change her anbx to cefepime alone Stop vanco Recheck BCx Check c diff Repeat BCx 1-2 weeks after she completes therapy Defer to IR about how to handle her drain  Comment- she has a port in place but would not pull unless her repeat BCx are positive or her bacteremia recurs (she will need repeat BCx at end of therapy). I am not sure what the next step is for her drain, it is uncomfortable and has previously been leaking.   Thank you so much for this interesting consult,   Bobby Rumpf (pager) [phone] [email or web address]  [name] is an 63 y.o. female.  HPI: 63 yo F with hx of metastatic cervical CA (initially dx 2005), liver metastasis since 2012 (with biliary obstruction). She has two biliary drains- a new R drain was placed and the left was exchanged on 2-27. She was seen back on 3-5 with complaints of pain, leakage from site. She had her drain flushed and pain improved.  She returned to the ED on 3-6 with 3 to 4 days of fever (culminating in temp 102.6), drainage from drain site and erythema of skin as well. She was started on vanco/zosyn, had CT of abd which showed 1. No intra-abdominal abscess or other definitive source of intra-abdominal infection. 2. Decreased biliary dilatation after bilateral biliary drain placement. 3. Intra-abdominal metastatic disease, recently staged (04/05/2013).  She is now found to have BCx+ Serratia marcescens.  She has been afebrile in hospital.   Past Medical History  Diagnosis Date  . Diastolic dysfunction   . MIGRAINE HEADACHE   . Atrial fibrillation     failed DCCV, CHADs2-prev pradaxa stopped due to hematuria with ureter issues/JJ   . Anemia     . HTN (hypertension)   . GLAUCOMA   . CARPAL TUNNEL SYNDROME, RIGHT   . Lymphedema     L>R leg from pelvic XRT/scarring  . Hepatic steatosis     CT scan 06/2009, 12/2009  . DVT (deep venous thrombosis) 10/2010    LLE, anticoag resumed  . CHF (congestive heart failure)     1 yr ago per pt  . Arthritis   . GERD (gastroesophageal reflux disease)   . Small kidney     left  . Radiation 10/11/11-11/19/11    5040 cGy in 28 fx's/periaortic  . Radiation 03/16/10-03/25/10    right inguinal node/left external iliac node  . Radiation 06/11/08-07/23/08    6000 cGy left pelvis  . Hyperlipidemia   . Leg swelling     left  . Cervical cancer dx 04/2003, recur 04/2008    s/p debulk (TAHBSO), chemo/XRT; recurrent dz L pelvis dx 2010 and liver met 09/2010 s/p RFA  . Osteoporosis with fracture 2014    compression fx, s/p KP ; started prolia 06/2012- narcotic pain mgmt    Past Surgical History  Procedure Laterality Date  . Ureteral stent placement  2005, 01/2010, 07/2010    L hydro related to cervical ca and XRT  . Oophorectomy  2005  . Cardioversion    . Tonsillectomy    . Cholecystectomy  1984  . Total abdominal hysterectomy  2005  . Carpal tunnel  2009    right  . Ivc filter  04/2011  Allergies  Allergen Reactions  . Codeine Other (See Comments)    Breathing issues  . Flecainide Acetate Other (See Comments)    Leg cramping   . Morphine Other (See Comments)     Hallucinations  . Propafenone Hcl Other (See Comments)    Leg cramps    Medications:  Scheduled: . citalopram  20 mg Oral BID  . diltiazem  180 mg Oral Daily  . docusate sodium  100 mg Oral BID  . fentaNYL  100 mcg Transdermal Q72H  . loratadine  10 mg Oral Daily  . metoprolol tartrate  25 mg Oral BID  . pantoprazole  40 mg Oral Daily  . piperacillin-tazobactam (ZOSYN)  IV  3.375 g Intravenous 3 times per day  . potassium chloride  10 mEq Oral BID  . Rivaroxaban  20 mg Oral QPM  . sodium chloride  3 mL Intravenous Q12H   . vancomycin  1,000 mg Intravenous Q12H    Abtx:  Anti-infectives   Start     Dose/Rate Route Frequency Ordered Stop   05/05/13 1000  vancomycin (VANCOCIN) IVPB 1000 mg/200 mL premix     1,000 mg 200 mL/hr over 60 Minutes Intravenous Every 12 hours 05/05/13 0344     05/05/13 0000  vancomycin (VANCOCIN) IVPB 1000 mg/200 mL premix     1,000 mg 200 mL/hr over 60 Minutes Intravenous  Once 05/04/13 2355 05/05/13 0124   05/04/13 2345  piperacillin-tazobactam (ZOSYN) IVPB 3.375 g     3.375 g 12.5 mL/hr over 240 Minutes Intravenous 3 times per day 05/04/13 2336        Total days of antibiotics: 3 vanco/zosyn          Social History:  reports that she quit smoking about 29 years ago. She has never used smokeless tobacco. She reports that she does not drink alcohol or use illicit drugs.  Family History  Problem Relation Age of Onset  . Lung cancer    . Hypertension    . Heart disease    . Stroke    . Asthma    . Asthma Son   . Cancer Mother     lung  . Stroke Father     General ROS: no chemotherapy for 2 months, no dysphagia, decreased appetite, no problems with her port, 4 loose BM today, no dysuria, see HPI.   Blood pressure 116/81, pulse 93, temperature 98 F (36.7 C), temperature source Oral, resp. rate 18, height _0  (1.549 m), weight 71.578 kg (157 lb 12.8 oz), SpO2 99.00%. General appearance: alert, cooperative, icteric and no distress Eyes: negative findings: pupils equal, round, reactive to light and accomodation Throat: normal findings: oropharynx pink & moist without lesions or evidence of thrush Neck: no adenopathy and supple, symmetrical, trachea midline Lungs: clear to auscultation bilaterally and R sided port- non-tender, no erythema, no fluctuance.  Heart: irregularly irregular rhythm Abdomen: normal findings: bowel sounds normal and midline biliary drain: clean, non-tender, no erythema. L sided biliary drain: tender, no d/c, miminal erythema at site.   Extremities: edema none Neurologic: Grossly normal   Results for orders placed during the hospital encounter of 05/04/13 (from the past 48 hour(s))  PROCALCITONIN     Status: None   Collection Time    05/06/13  5:00 AM      Result Value Ref Range   Procalcitonin 0.55     Comment:            Interpretation:     PCT >  0.5 ng/mL and <= 2 ng/mL:     Systemic infection (sepsis) is possible,     but other conditions are known to elevate     PCT as well.     (NOTE)             ICU PCT Algorithm               Non ICU PCT Algorithm        ----------------------------     ------------------------------             PCT < 0.25 ng/mL                 PCT < 0.1 ng/mL         Stopping of antibiotics            Stopping of antibiotics           strongly encouraged.               strongly encouraged.        ----------------------------     ------------------------------           PCT level decrease by               PCT < 0.25 ng/mL           >= 80% from peak PCT           OR PCT 0.25 - 0.5 ng/mL          Stopping of antibiotics                                                 encouraged.         Stopping of antibiotics               encouraged.        ----------------------------     ------------------------------           PCT level decrease by              PCT >= 0.25 ng/mL           < 80% from peak PCT            AND PCT >= 0.5 ng/mL            Continuing antibiotics                                                  encouraged.           Continuing antibiotics                encouraged.        ----------------------------     ------------------------------         PCT level increase compared          PCT > 0.5 ng/mL             with peak PCT AND              PCT >= 0.5 ng/mL             Escalation of antibiotics  strongly encouraged.          Escalation of antibiotics            strongly encouraged.  VANCOMYCIN, TROUGH     Status: None    Collection Time    05/06/13  9:30 PM      Result Value Ref Range   Vancomycin Tr 17.4  10.0 - 20.0 ug/mL  CBC     Status: Abnormal   Collection Time    05/07/13  4:30 AM      Result Value Ref Range   WBC 5.3  4.0 - 10.5 K/uL   RBC 2.70 (*) 3.87 - 5.11 MIL/uL   Hemoglobin 8.3 (*) 12.0 - 15.0 g/dL   HCT 24.8 (*) 36.0 - 46.0 %   MCV 91.9  78.0 - 100.0 fL   MCH 30.7  26.0 - 34.0 pg   MCHC 33.5  30.0 - 36.0 g/dL   RDW 13.9  11.5 - 15.5 %   Platelets 252  150 - 400 K/uL  BASIC METABOLIC PANEL     Status: Abnormal   Collection Time    05/07/13  4:30 AM      Result Value Ref Range   Sodium 135 (*) 137 - 147 mEq/L   Potassium 3.3 (*) 3.7 - 5.3 mEq/L   Chloride 102  96 - 112 mEq/L   CO2 22  19 - 32 mEq/L   Glucose, Bld 92  70 - 99 mg/dL   BUN 5 (*) 6 - 23 mg/dL   Creatinine, Ser 0.59  0.50 - 1.10 mg/dL   Calcium 7.7 (*) 8.4 - 10.5 mg/dL   GFR calc non Af Amer >90  >90 mL/min   GFR calc Af Amer >90  >90 mL/min   Comment: (NOTE)     The eGFR has been calculated using the CKD EPI equation.     This calculation has not been validated in all clinical situations.     eGFR's persistently <90 mL/min signify possible Chronic Kidney     Disease.      Component Value Date/Time   SDES BLOOD RIGHT PORTA CATH 05/04/2013 2140   SPECREQUEST BOTTLES DRAWN AEROBIC AND ANAEROBIC 5CC 05/04/2013 2140   CULT  Value: SERRATIA MARCESCENS Note: Gram Stain Report Called to,Read Back By and Verified With: [name] 05/05/13 @ 6:28PM BY RUSCOE A. Performed at [facility] 05/04/2013 2140   REPTSTATUS 05/07/2013 FINAL 05/04/2013 2140   No results found. Recent Results (from the past 240 hour(s))  CULTURE, BLOOD (ROUTINE X 2)     Status: None   Collection Time    05/04/13  8:41 PM      Result Value Ref Range Status   Specimen Description BLOOD LEFT ANTECUBITAL   Final   Special Requests BOTTLES DRAWN AEROBIC AND ANAEROBIC 8ML   Final   Culture  Setup Time     Final   Value: 05/05/2013 01:09     Performed  at [facility]   Culture     Final   Value: SERRATIA MARCESCENS     Note: SUSCEPTIBILITIES PERFORMED ON PREVIOUS CULTURE WITHIN THE LAST 5 DAYS.     Note: Gram Stain Report Called to,Read Back By and Verified With: Leiloni HUFF 05/05/13 @ 6:28PM BY RUSCOE A.     Performed at [facility]   Report Status 05/07/2013 FINAL   Final  CULTURE, BLOOD (ROUTINE X 2)     Status: None   Collection Time  05/04/13  9:40 PM      Result Value Ref Range Status   Specimen Description BLOOD RIGHT PORTA CATH   Final   Special Requests BOTTLES DRAWN AEROBIC AND ANAEROBIC 5CC   Final   Culture  Setup Time     Final   Value: 05/05/2013 01:09     Performed at [facility]   Culture     Final   Value: SERRATIA MARCESCENS     Note: Gram Stain Report Called to,Read Back By and Verified With: [name] 05/05/13 @ 6:28PM BY RUSCOE A.     Performed at [facility]   Report Status 05/07/2013 FINAL   Final   Organism ID, Bacteria SERRATIA MARCESCENS   Final      05/07/2013, 2:54 PM     LOS: 3 days

## 2013-05-08 ENCOUNTER — Other Ambulatory Visit: Payer: 59

## 2013-05-08 ENCOUNTER — Ambulatory Visit: Payer: 59 | Admitting: Adult Health

## 2013-05-08 DIAGNOSIS — E876 Hypokalemia: Secondary | ICD-10-CM

## 2013-05-08 DIAGNOSIS — R7881 Bacteremia: Secondary | ICD-10-CM

## 2013-05-08 DIAGNOSIS — A4152 Sepsis due to Pseudomonas: Secondary | ICD-10-CM

## 2013-05-08 DIAGNOSIS — R197 Diarrhea, unspecified: Secondary | ICD-10-CM

## 2013-05-08 DIAGNOSIS — C787 Secondary malignant neoplasm of liver and intrahepatic bile duct: Secondary | ICD-10-CM

## 2013-05-08 LAB — PROCALCITONIN: Procalcitonin: 0.27 ng/mL

## 2013-05-08 LAB — CLOSTRIDIUM DIFFICILE BY PCR: CDIFFPCR: NEGATIVE

## 2013-05-08 LAB — PHOSPHORUS: PHOSPHORUS: 2.1 mg/dL — AB (ref 2.3–4.6)

## 2013-05-08 MED ORDER — RIVAROXABAN 20 MG PO TABS
20.0000 mg | ORAL_TABLET | Freq: Every evening | ORAL | Status: DC
  Administered 2013-05-09 – 2013-05-14 (×6): 20 mg via ORAL
  Filled 2013-05-08 (×8): qty 1

## 2013-05-08 MED ORDER — CITALOPRAM HYDROBROMIDE 20 MG PO TABS
20.0000 mg | ORAL_TABLET | Freq: Every day | ORAL | Status: DC
  Administered 2013-05-08 – 2013-05-15 (×8): 20 mg via ORAL
  Filled 2013-05-08 (×8): qty 1

## 2013-05-08 MED ORDER — K PHOS MONO-SOD PHOS DI & MONO 155-852-130 MG PO TABS
250.0000 mg | ORAL_TABLET | Freq: Two times a day (BID) | ORAL | Status: AC
  Administered 2013-05-08 (×2): 250 mg via ORAL
  Filled 2013-05-08 (×3): qty 1

## 2013-05-08 NOTE — Progress Notes (Signed)
INFECTIOUS DISEASE PROGRESS NOTE  ID: [name] is a 63 y.o. female with  Active Problems:   Atrial fibrillation   Cervical cancer   Anemia of chronic disease   Hyponatremia   Protein-calorie malnutrition, severe   Abdominal pain, unspecified site   Malignant obstructive jaundice   Ascending cholangitis   Cholangitis   Sepsis   History of DVT (deep vein thrombosis)   Gram-negative bacteremia   Hypophosphatemia   Hypokalemia  Subjective: 1 mildly loose BM today. continued abd pain  Abtx:  Anti-infectives   Start     Dose/Rate Route Frequency Ordered Stop   05/07/13 1600  ceFEPIme (MAXIPIME) 1 g in dextrose 5 % 50 mL IVPB     1 g 100 mL/hr over 30 Minutes Intravenous Every 12 hours 05/07/13 1534     05/05/13 1000  vancomycin (VANCOCIN) IVPB 1000 mg/200 mL premix  Status:  Discontinued     1,000 mg 200 mL/hr over 60 Minutes Intravenous Every 12 hours 05/05/13 0344 05/07/13 1534   05/05/13 0000  vancomycin (VANCOCIN) IVPB 1000 mg/200 mL premix     1,000 mg 200 mL/hr over 60 Minutes Intravenous  Once 05/04/13 2355 05/05/13 0124   05/04/13 2345  piperacillin-tazobactam (ZOSYN) IVPB 3.375 g  Status:  Discontinued     3.375 g 12.5 mL/hr over 240 Minutes Intravenous 3 times per day 05/04/13 2336 05/07/13 1534      Medications:  Scheduled: . ceFEPime (MAXIPIME) IV  1 g Intravenous Q12H  . citalopram  20 mg Oral Daily  . diltiazem  180 mg Oral Daily  . docusate sodium  100 mg Oral BID  . fentaNYL  200 mcg Transdermal Q72H  . loratadine  10 mg Oral Daily  . metoprolol tartrate  25 mg Oral BID  . pantoprazole  40 mg Oral Daily  . phosphorus  250 mg Oral BID  . potassium chloride  10 mEq Oral BID  . [START ON 05/09/2013] Rivaroxaban  20 mg Oral QPM  . sodium chloride  3 mL Intravenous Q12H    Objective: Vital signs in last 24 hours: Temp:  [97.5 F (36.4 C)-98.1 F (36.7 C)] 97.5 F (36.4 C) (03/10 1044) Pulse Rate:  [90-102] 93 (03/10 1044) Resp:  [16-18] 18  (03/10 1044) BP: (116-130)/(73-102) 127/77 mmHg (03/10 1044) SpO2:  [98 %-99 %] 99 % (03/10 1044)   General appearance: alert, cooperative, icteric and no distress Resp: clear to auscultation bilaterally Chest wall: R chest port is non-tender, non-fluctuant Cardio: regular rate and rhythm GI: normal findings: bowel sounds normal and soft, non-tender  Lab Results  Recent Labs  05/07/13 0430  WBC 5.3  HGB 8.3*  HCT 24.8*  NA 135*  K 3.3*  CL 102  CO2 22  BUN 5*  CREATININE 0.59   Liver Panel No results found for this basename: PROT, ALBUMIN, AST, ALT, ALKPHOS, BILITOT, BILIDIR, IBILI,  in the last 72 hours Sedimentation Rate No results found for this basename: ESRSEDRATE,  in the last 72 hours C-Reactive Protein No results found for this basename: CRP,  in the last 72 hours  Microbiology: Recent Results (from the past 240 hour(s))  CULTURE, BLOOD (ROUTINE X 2)     Status: None   Collection Time    05/04/13  8:41 PM      Result Value Ref Range Status   Specimen Description BLOOD LEFT ANTECUBITAL   Final   Special Requests BOTTLES DRAWN AEROBIC AND ANAEROBIC 8ML   Final  Culture  Setup Time     Final   Value: 05/05/2013 01:09     Performed at [facility]   Culture     Final   Value: SERRATIA MARCESCENS     Note: SUSCEPTIBILITIES PERFORMED ON PREVIOUS CULTURE WITHIN THE LAST 5 DAYS.     Note: Gram Stain Report Called to,Read Back By and Verified With: Alfreida HUFF 05/05/13 @ 6:28PM BY RUSCOE A.     Performed at [facility]   Report Status 05/07/2013 FINAL   Final  CULTURE, BLOOD (ROUTINE X 2)     Status: None   Collection Time    05/04/13  9:40 PM      Result Value Ref Range Status   Specimen Description BLOOD RIGHT PORTA CATH   Final   Special Requests BOTTLES DRAWN AEROBIC AND ANAEROBIC 5CC   Final   Culture  Setup Time     Final   Value: 05/05/2013 01:09     Performed at [facility]   Culture     Final   Value: SERRATIA MARCESCENS      Note: Gram Stain Report Called to,Read Back By and Verified With: [name] 05/05/13 @ 6:28PM BY RUSCOE A.     Performed at [facility]   Report Status 05/07/2013 FINAL   Final   Organism ID, Bacteria SERRATIA MARCESCENS   Final  CULTURE, BLOOD (ROUTINE X 2)     Status: None   Collection Time    05/07/13  4:25 PM      Result Value Ref Range Status   Specimen Description BLOOD RIGHT ARM   Final   Special Requests BOTTLES DRAWN AEROBIC AND ANAEROBIC 10CC EACH   Final   Culture  Setup Time     Final   Value: 05/07/2013 20:56     Performed at [facility]   Culture     Final   Value:        BLOOD CULTURE RECEIVED NO GROWTH TO DATE CULTURE WILL BE HELD FOR 5 DAYS BEFORE ISSUING A FINAL NEGATIVE REPORT     Performed at [facility]   Report Status PENDING   Incomplete  CLOSTRIDIUM DIFFICILE BY PCR     Status: None   Collection Time    05/08/13  5:12 AM      Result Value Ref Range Status   C difficile by pcr NEGATIVE  NEGATIVE Final   Comment: Performed at [facility]    Studies/Results: No results found.   Assessment/Plan: Serratia Bacteremia  Cervical Ca, stage IV   Liver metastases, biliary obstruction Loose BM (C diff -)  Total days of antibiotics: 4 (cefepime)  Would d/c enteric isolation  Watch repeat BCx. Leave port in place for now IR may manipulate drains 3-11  Dr Baxter Flattery will be following after today.           Bobby Rumpf Infectious Diseases (pager) [phone] [email or web address] 05/08/2013, 1:43 PM  LOS: 4 days

## 2013-05-08 NOTE — Care Management Note (Addendum)
    Page 1 of 2   05/15/2013     12:08:22 PM   CARE MANAGEMENT NOTE 05/15/2013  Patient:  [name], [name]   Account Number:  [id number]  Date Initiated:  05/08/2013  Documentation initiated by:  Dessa Phi  Subjective/Objective Assessment:   63 Y/O F ADMITTED W/SEPSIS.     Action/Plan:   FROM HOME.   Anticipated DC Date:  05/15/2013   Anticipated DC Plan:  [place]  CM consult      Choice offered to / List presented to:  C-1 Patient        [place] arranged  HH-1 RN      [place].   Status of service:  Completed, signed off Medicare Important Message given?   (If response is "NO", the following Medicare IM given date fields will be blank) Date Medicare IM given:   Date Additional Medicare IM given:    Discharge Disposition:  [place]  Per UR Regulation:  Reviewed for med. necessity/level of care/duration of stay  If discussed at [place] of Stay Meetings, dates discussed:   05/10/2013  05/15/2013    Comments:  05/15/13 Minnette Merida RN,BSN NCM [address].NO FURTHER D/C NEEDS.  05/14/13 Navpreet Szczygiel RN,BSN NCM [phone] [facility] REP [place] FOR HHRN-DRAIN MGMNT.AFIB TODAY.  05/11/13 Mindy Gali RN,BSN NCM [phone] BILAT HEPATIC DRAISN-ENTERBACTER, & YEAST,HIGH OUTPUT.IV ABX.NO ANTICIPATED D/C NEEDS.  05/10/13 Tanmay Halteman RN,BSN NCM [phone] BILAT BILARY DRAINS-IR FOLLOWING.IV ABX.  05/08/13 Season Astacio RN,BSN NCM [phone] BILIARY DRAIN.IR FOLLOWING.IV ABX.D/C PLAN HOME.

## 2013-05-08 NOTE — Progress Notes (Signed)
  Subjective: Pt still with leaking/pain around rt biliary drain; appears weak  Objective: Vital signs in last 24 hours: Temp:  [97.5 F (36.4 C)-98.1 F (36.7 C)] 97.5 F (36.4 C) (03/10 1044) Pulse Rate:  [90-102] 93 (03/10 1044) Resp:  [16-18] 18 (03/10 1044) BP: (116-130)/(73-102) 127/77 mmHg (03/10 1044) SpO2:  [98 %-99 %] 99 % (03/10 1044) Last BM Date: 05/08/13  Intake/Output from previous day: 03/09 0701 - 03/10 0700 In: 2680 [P.O.:180; I.V.:2400; IV Piggyback:100] Out: 0277 [Urine:3100; Drains:575] Intake/Output this shift: Total I/O In: 60 [P.O.:60] Out: 300 [Urine:300]  Left biliary drain intact, output 225 cc's; insertion site ok, NT; rt biliary drain intact, gauze dressing saturated with bile; output 350 cc's; insertion site mod tender; rt biliary drain irrigated without difficulty  Lab Results:   Recent Labs  05/07/13 0430  WBC 5.3  HGB 8.3*  HCT 24.8*  PLT 252   BMET  Recent Labs  05/07/13 0430  NA 135*  K 3.3*  CL 102  CO2 22  GLUCOSE 92  BUN 5*  CREATININE 0.59  CALCIUM 7.7*   PT/INR No results found for this basename: LABPROT, INR,  in the last 72 hours ABG No results found for this basename: PHART, PCO2, PO2, HCO3,  in the last 72 hours  Studies/Results: No results found.  Anti-infectives: Anti-infectives   Start     Dose/Rate Route Frequency Ordered Stop   05/07/13 1600  ceFEPIme (MAXIPIME) 1 g in dextrose 5 % 50 mL IVPB     1 g 100 mL/hr over 30 Minutes Intravenous Every 12 hours 05/07/13 1534     05/05/13 1000  vancomycin (VANCOCIN) IVPB 1000 mg/200 mL premix  Status:  Discontinued     1,000 mg 200 mL/hr over 60 Minutes Intravenous Every 12 hours 05/05/13 0344 05/07/13 1534   05/05/13 0000  vancomycin (VANCOCIN) IVPB 1000 mg/200 mL premix     1,000 mg 200 mL/hr over 60 Minutes Intravenous  Once 05/04/13 2355 05/05/13 0124   05/04/13 2345  piperacillin-tazobactam (ZOSYN) IVPB 3.375 g  Status:  Discontinued     3.375 g 12.5  mL/hr over 240 Minutes Intravenous 3 times per day 05/04/13 2336 05/07/13 1534      Assessment/Plan: s/p bilateral biliary drain placements for 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

## 2013-05-08 NOTE — Progress Notes (Signed)
[facility] PROGRESS NOTE  [name] WUJ:[id number] DOB: [birth year] DOA: 05/04/2013 PCP: Gwendolyn Grant, MD Brief narrative: Patient is a 63 year old with history of cervical cancer diagnosed in 2005 status post total hysterectomy with recurrence in 2010 status post chemotherapy and radiation treatment found to have metastatic spread to the liver since 2012. The patient subsequently developed biliary obstruction of which stenting was performed by interventional radiology. Patient presents for this admission with sepsis and gram-negative bacteremia growing Serratia marcescens.   Assessment/Plan:  Sepsis/ gram negative bacteremia - Will continue broad spectrum antibiotics at this juncture - Pt growing: SERRATIA MARCESCENS - Consulted ID for further evaluation and recommendations. Patient currently on cefepime - Deferring to IR recommendations regarding drain in lieu of current bacteremia. (IR reconsulted today for reevaluation and recommendations given current bacteremia)  Active Problems:   Atrial fibrillation - At this point we'll continue metoprolol and diltiazem. - Currently patient is on xarelto    Cervical cancer - Pt to follow up with oncologist on discharge    Anemia of chronic disease - Hemoglobin currently stable    Hyponatremia - Most likely due to poor oral solute intake. Currently improving and suspect continued improvement with improvement in oral intake.     Protein-calorie malnutrition, severe - Currently on regular diet    Abdominal pain, unspecified site - Resolving on broad spectrum antibiotics. - Reportedly IR not planning any procedures today    History of DVT (deep vein thrombosis) - Patient is currently on xarelto  Code Status: full Family Communication: No family at bedside Disposition Plan: Pending continued improvement in condition.   Consultants:  Radiology  Procedures:  None  Antibiotics:  Patient is on vancomycin and  Zosyn  HPI/Subjective: Patient reports that she takes Cymbalta only one time a day and she was requesting we decreased this dose from twice a day to daily dosing. No other new complaints otherwise.  Objective: Filed Vitals:   05/08/13 1044  BP: 127/77  Pulse: 93  Temp: 97.5 F (36.4 C)  Resp: 18    Intake/Output Summary (Last 24 hours) at 05/08/13 1200 Last data filed at 05/08/13 1014  Gross per 24 hour  Intake 2680.01 ml  Output   3175 ml  Net -494.99 ml   Filed Weights   05/04/13 1931 05/04/13 2342  Weight: 72.576 kg (160 lb) 71.578 kg (157 lb 12.8 oz)    Exam:   General:  Pt in NAD, alert and awake  Cardiovascular: RRR, no MRG  Respiratory: CTA BL, no wheezes  Abdomen: 2 drains in place with right lateral one having bile drainage at insertion site.   Musculoskeletal: no cyanosis or clubbing   Data Reviewed: Basic Metabolic Panel:  Recent Labs Lab 05/03/13 1501 05/04/13 2041 05/05/13 0355 05/07/13 0430 05/08/13 0350  NA 136 129* 130* 135*  --   K 3.6 3.6* 3.2* 3.3*  --   CL  --  96 97 102  --   CO2 23 20 22 22   --   GLUCOSE 101 111* 88 92  --   BUN 11.5 12 10  5*  --   CREATININE 0.7 0.71 0.58 0.59  --   CALCIUM 8.8 8.4 7.9* 7.7*  --   MG  --   --  1.6  --   --   PHOS  --   --  2.0*  --  2.1*   Liver Function Tests:  Recent Labs Lab 05/03/13 1501 05/04/13 2041 05/05/13 0355  AST 29 25 21   ALT  32 27 24  ALKPHOS 639* 601* 484*  BILITOT 4.41* 4.5* 4.0*  PROT 6.5 6.4 5.3*  ALBUMIN 2.1* 1.9* 1.7*    Recent Labs Lab 05/04/13 2041  LIPASE 9*   No results found for this basename: AMMONIA,  in the last 168 hours CBC:  Recent Labs Lab 05/03/13 1501 05/04/13 2041 05/05/13 0355 05/07/13 0430  WBC 6.7 8.5 6.2 5.3  NEUTROABS 5.2 6.9  --   --   HGB 10.2* 9.5* 8.4* 8.3*  HCT 30.9* 28.1* 25.7* 24.8*  MCV 92.8 90.9 91.8 91.9  PLT 332 280 237 252   Cardiac Enzymes: No results found for this basename: CKTOTAL, CKMB, CKMBINDEX, TROPONINI,   in the last 168 hours BNP (last 3 results) No results found for this basename: PROBNP,  in the last 8760 hours CBG: No results found for this basename: GLUCAP,  in the last 168 hours  Recent Results (from the past 240 hour(s))  CULTURE, BLOOD (ROUTINE X 2)     Status: None   Collection Time    05/04/13  8:41 PM      Result Value Ref Range Status   Specimen Description BLOOD LEFT ANTECUBITAL   Final   Special Requests BOTTLES DRAWN AEROBIC AND ANAEROBIC 8ML   Final   Culture  Setup Time     Final   Value: 05/05/2013 01:09     Performed at [facility]   Culture     Final   Value: SERRATIA MARCESCENS     Note: SUSCEPTIBILITIES PERFORMED ON PREVIOUS CULTURE WITHIN THE LAST 5 DAYS.     Note: Gram Stain Report Called to,Read Back By and Verified With: Mallarie HUFF 05/05/13 @ 6:28PM BY RUSCOE A.     Performed at [facility]   Report Status 05/07/2013 FINAL   Final  CULTURE, BLOOD (ROUTINE X 2)     Status: None   Collection Time    05/04/13  9:40 PM      Result Value Ref Range Status   Specimen Description BLOOD RIGHT PORTA CATH   Final   Special Requests BOTTLES DRAWN AEROBIC AND ANAEROBIC 5CC   Final   Culture  Setup Time     Final   Value: 05/05/2013 01:09     Performed at [facility]   Culture     Final   Value: SERRATIA MARCESCENS     Note: Gram Stain Report Called to,Read Back By and Verified With: [name] 05/05/13 @ 6:28PM BY RUSCOE A.     Performed at [facility]   Report Status 05/07/2013 FINAL   Final   Organism ID, Bacteria SERRATIA MARCESCENS   Final  CULTURE, BLOOD (ROUTINE X 2)     Status: None   Collection Time    05/07/13  4:25 PM      Result Value Ref Range Status   Specimen Description BLOOD RIGHT ARM   Final   Special Requests BOTTLES DRAWN AEROBIC AND ANAEROBIC 10CC EACH   Final   Culture  Setup Time     Final   Value: 05/07/2013 20:56     Performed at [facility]   Culture     Final   Value:        BLOOD CULTURE  RECEIVED NO GROWTH TO DATE CULTURE WILL BE HELD FOR 5 DAYS BEFORE ISSUING A FINAL NEGATIVE REPORT     Performed at [facility]   Report Status PENDING   Incomplete  CLOSTRIDIUM DIFFICILE BY  PCR     Status: None   Collection Time    05/08/13  5:12 AM      Result Value Ref Range Status   C difficile by pcr NEGATIVE  NEGATIVE Final   Comment: Performed at [facility]     Studies: No results found.  Scheduled Meds: . ceFEPime (MAXIPIME) IV  1 g Intravenous Q12H  . citalopram  20 mg Oral Daily  . diltiazem  180 mg Oral Daily  . docusate sodium  100 mg Oral BID  . fentaNYL  200 mcg Transdermal Q72H  . loratadine  10 mg Oral Daily  . metoprolol tartrate  25 mg Oral BID  . pantoprazole  40 mg Oral Daily  . phosphorus  250 mg Oral BID  . potassium chloride  10 mEq Oral BID  . Rivaroxaban  20 mg Oral QPM  . sodium chloride  3 mL Intravenous Q12H   Continuous Infusions: . sodium chloride 100 mL/hr at 05/08/13 1141     Time spent: > 35 minutes    Velvet Bathe  [facility] Pager [phone] If 7PM-7AM, please contact night-coverage at [email or web address], password [facility] 05/08/2013, 12:00 PM  LOS: 4 days

## 2013-05-09 ENCOUNTER — Inpatient Hospital Stay (HOSPITAL_COMMUNITY): Payer: 59

## 2013-05-09 DIAGNOSIS — K831 Obstruction of bile duct: Secondary | ICD-10-CM

## 2013-05-09 DIAGNOSIS — C539 Malignant neoplasm of cervix uteri, unspecified: Secondary | ICD-10-CM

## 2013-05-09 LAB — BASIC METABOLIC PANEL
BUN: 4 mg/dL — AB (ref 6–23)
CHLORIDE: 101 meq/L (ref 96–112)
CO2: 23 mEq/L (ref 19–32)
Calcium: 8.3 mg/dL — ABNORMAL LOW (ref 8.4–10.5)
Creatinine, Ser: 0.57 mg/dL (ref 0.50–1.10)
GFR calc Af Amer: >90 mL/min (ref >90)
GFR calc non Af Amer: >90 mL/min (ref >90)
Glucose, Bld: 108 mg/dL — ABNORMAL HIGH (ref 70–99)
POTASSIUM: 3.5 meq/L — AB (ref 3.7–5.3)
Sodium: 135 mEq/L — ABNORMAL LOW (ref 137–147)

## 2013-05-09 LAB — PROTIME-INR
INR: 2.15 — ABNORMAL HIGH (ref 0.00–1.49)
Prothrombin Time: 23.3 seconds — ABNORMAL HIGH (ref 11.6–15.2)

## 2013-05-09 LAB — CBC
HEMATOCRIT: 27.9 % — AB (ref 36.0–46.0)
HEMOGLOBIN: 9 g/dL — AB (ref 12.0–15.0)
MCH: 29.2 pg (ref 26.0–34.0)
MCHC: 32.3 g/dL (ref 30.0–36.0)
MCV: 90.6 fL (ref 78.0–100.0)
Platelets: 281 10*3/uL (ref 150–400)
RBC: 3.08 MIL/uL — ABNORMAL LOW (ref 3.87–5.11)
RDW: 14.1 % (ref 11.5–15.5)
WBC: 6.1 10*3/uL (ref 4.0–10.5)

## 2013-05-09 LAB — HEPATIC FUNCTION PANEL
ALT: 16 U/L (ref 0–35)
AST: 18 U/L (ref 0–37)
Albumin: 1.7 g/dL — ABNORMAL LOW (ref 3.5–5.2)
Alkaline Phosphatase: 589 U/L — ABNORMAL HIGH (ref 39–117)
BILIRUBIN DIRECT: 1.9 mg/dL — AB (ref 0.0–0.3)
BILIRUBIN INDIRECT: 1 mg/dL — AB (ref 0.3–0.9)
TOTAL PROTEIN: 5.5 g/dL — AB (ref 6.0–8.3)
Total Bilirubin: 2.9 mg/dL — ABNORMAL HIGH (ref 0.3–1.2)

## 2013-05-09 LAB — APTT: APTT: 50 s — AB (ref 24–37)

## 2013-05-09 LAB — PHOSPHORUS: Phosphorus: 2.5 mg/dL (ref 2.3–4.6)

## 2013-05-09 MED ORDER — FENTANYL CITRATE 0.05 MG/ML IJ SOLN
INTRAMUSCULAR | Status: AC | PRN
  Administered 2013-05-09 (×2): 50 ug via INTRAVENOUS
  Administered 2013-05-09: 100 ug via INTRAVENOUS

## 2013-05-09 MED ORDER — IOHEXOL 300 MG/ML  SOLN
10.0000 mL | Freq: Once | INTRAMUSCULAR | Status: AC | PRN
Start: 2013-05-09 — End: 2013-05-09
  Administered 2013-05-09: 10 mL

## 2013-05-09 MED ORDER — FENTANYL CITRATE 0.05 MG/ML IJ SOLN
INTRAMUSCULAR | Status: AC
  Filled 2013-05-09: qty 4

## 2013-05-09 MED ORDER — HYDROMORPHONE HCL PF 1 MG/ML IJ SOLN
1.0000 mg | Freq: Once | INTRAMUSCULAR | Status: AC
  Administered 2013-05-09: 1 mg via INTRAVENOUS

## 2013-05-09 MED ORDER — MIDAZOLAM HCL 2 MG/2ML IJ SOLN
INTRAMUSCULAR | Status: AC
  Filled 2013-05-09: qty 4

## 2013-05-09 MED ORDER — MIDAZOLAM HCL 2 MG/2ML IJ SOLN
INTRAMUSCULAR | Status: AC | PRN
  Administered 2013-05-09: 2 mg via INTRAVENOUS

## 2013-05-09 NOTE — Progress Notes (Signed)
  Subjective: Pt still with abd pain, primarily rt sided and leaking around rt biliary drain  Objective: Vital signs in last 24 hours: Temp:  [97.5 F (36.4 C)-97.6 F (36.4 C)] 97.5 F (36.4 C) (03/11 0420) Pulse Rate:  [85-92] 92 (03/11 0420) Resp:  [18-20] 18 (03/11 0420) BP: (124-127)/(73-86) 124/85 mmHg (03/11 0420) SpO2:  [97 %-99 %] 97 % (03/11 0420) Last BM Date: 05/08/13  Intake/Output from previous day: 03/10 0701 - 03/11 0700 In: 520 [P.O.:420; IV Piggyback:100] Out: 2655 [Urine:2150; Drains:505] Intake/Output this shift: Total I/O In: 0  Out: 300 [Urine:300]  R/L biliary drains intact. Outputs (R)- 325 cc's, (L) -180 cc's; rt drain with leakage of bile onto gauze; bile cx's pending(gram neg rods); chest- dim BS bases; heart- irreg;  Lab Results:   Recent Labs  05/07/13 0430 05/09/13 0515  WBC 5.3 6.1  HGB 8.3* 9.0*  HCT 24.8* 27.9*  PLT 252 281   BMET  Recent Labs  05/07/13 0430 05/09/13 0515  NA 135* 135*  K 3.3* 3.5*  CL 102 101  CO2 22 23  GLUCOSE 92 108*  BUN 5* 4*  CREATININE 0.59 0.57  CALCIUM 7.7* 8.3*   PT/INR  Recent Labs  05/09/13 0515  LABPROT 23.3*  INR 2.15*   ABG No results found for this basename: PHART, PCO2, PO2, HCO3,  in the last 72 hours  Studies/Results: No results found.  Anti-infectives: Anti-infectives   Start     Dose/Rate Route Frequency Ordered Stop   05/07/13 1600  ceFEPIme (MAXIPIME) 1 g in dextrose 5 % 50 mL IVPB     1 g 100 mL/hr over 30 Minutes Intravenous Every 12 hours 05/07/13 1534     05/05/13 1000  vancomycin (VANCOCIN) IVPB 1000 mg/200 mL premix  Status:  Discontinued     1,000 mg 200 mL/hr over 60 Minutes Intravenous Every 12 hours 05/05/13 0344 05/07/13 1534   05/05/13 0000  vancomycin (VANCOCIN) IVPB 1000 mg/200 mL premix     1,000 mg 200 mL/hr over 60 Minutes Intravenous  Once 05/04/13 2355 05/05/13 0124   05/04/13 2345  piperacillin-tazobactam (ZOSYN) IVPB 3.375 g  Status:   Discontinued     3.375 g 12.5 mL/hr over 240 Minutes Intravenous 3 times per day 05/04/13 2336 05/07/13 1534      Assessment/Plan: s/p bilateral biliary drain placements 2/27 for obstructive jaundice secondary to metastatic cervical cancer; recent serratia bacteremia (ID following); plan is for f/u cholangiogram today with possible drain exchange/upsizing . Pt aware of above.  LOS: 5 days    Anushka Hartinger,D [facility] 05/09/2013

## 2013-05-09 NOTE — Progress Notes (Signed)
[facility] PROGRESS NOTE  CARLA RASHAD OVZ:[id number] DOB: 1950/06/23 DOA: 05/04/2013 PCP: Gwendolyn Grant, MD  Brief narrative:  Patient is a 63 year old with history of cervical cancer diagnosed in 2005 status post total hysterectomy with recurrence in 2010 status post chemotherapy and radiation treatment found to have metastatic spread to the liver since 2012. The patient subsequently developed biliary obstruction of which stenting was performed by interventional radiology. Patient presents for this admission with sepsis and gram-negative bacteremia growing Serratia marcescens.   Assessment/Plan:  Sepsis/ gram negative bacteremia - Will continue broad spectrum antibiotics at this juncture - Pt growing: SERRATIA MARCESCENS -Repeat BC (3/9) pending. -Await ID input re: length of antibiotic treatment. -Sepsis parameters resolved.    Atrial fibrillation - At this point we'll continue metoprolol and diltiazem. - Currently patient is on xarelto    Cervical cancer - Pt to follow up with oncologist on discharge    Anemia of chronic disease - Hemoglobin currently stable    Hyponatremia - Most likely due to poor oral solute intake. Currently improving and suspect continued improvement with improvement in oral intake.     Protein-calorie malnutrition, severe - Currently on regular diet    History of DVT (deep vein thrombosis) - Patient is currently on xarelto  Code Status: full Family Communication: No family at bedside Disposition Plan: To be determined   Consultants:  IR  ID  Procedures:  None  Antibiotics:  Patient is on vancomycin and Zosyn  HPI/Subjective: Complains of significant pain following drain exchange earlier today.  Objective: Filed Vitals:   05/09/13 1143  BP: 144/95  Pulse: 77  Temp:   Resp: 16    Intake/Output Summary (Last 24 hours) at 05/09/13 1446 Last data filed at 05/09/13 0900  Gross per 24 hour  Intake    340 ml  Output    2340 ml  Net  -2000 ml   Filed Weights   05/04/13 1931 05/04/13 2342  Weight: 72.576 kg (160 lb) 71.578 kg (157 lb 12.8 oz)    Exam:   General:  alert and awake  Cardiovascular: RRR, no MRG  Respiratory: CTA BL, no wheezes  Abdomen: increased pain today, nondistended, hypoactive bowel sounds   Musculoskeletal: no cyanosis or clubbing   Data Reviewed: Basic Metabolic Panel:  Recent Labs Lab 05/03/13 1501 05/04/13 2041 05/05/13 0355 05/07/13 0430 05/08/13 0350 05/09/13 0515  NA 136 129* 130* 135*  --  135*  K 3.6 3.6* 3.2* 3.3*  --  3.5*  CL  --  96 97 102  --  101  CO2 $Re'23 20 22 22  'vSz$ --  23  GLUCOSE 101 111* 88 92  --  108*  BUN 11.$Remov'5 12 10 'sfFNgt$ 5*  --  4*  CREATININE 0.7 0.71 0.58 0.59  --  0.57  CALCIUM 8.8 8.4 7.9* 7.7*  --  8.3*  MG  --   --  1.6  --   --   --   PHOS  --   --  2.0*  --  2.1* 2.5   Liver Function Tests:  Recent Labs Lab 05/03/13 1501 05/04/13 2041 05/05/13 0355 05/09/13 0515  AST $Re'29 25 21 18  'czU$ ALT 32 $Remo'27 24 16  'WkDce$ ALKPHOS 639* 601* 484* 589*  BILITOT 4.41* 4.5* 4.0* 2.9*  PROT 6.5 6.4 5.3* 5.5*  ALBUMIN 2.1* 1.9* 1.7* 1.7*    Recent Labs Lab 05/04/13 2041  LIPASE 9*   No results found for this basename: AMMONIA,  in the last 168 hours  CBC:  Recent Labs Lab 05/03/13 1501 05/04/13 2041 05/05/13 0355 05/07/13 0430 05/09/13 0515  WBC 6.7 8.5 6.2 5.3 6.1  NEUTROABS 5.2 6.9  --   --   --   HGB 10.2* 9.5* 8.4* 8.3* 9.0*  HCT 30.9* 28.1* 25.7* 24.8* 27.9*  MCV 92.8 90.9 91.8 91.9 90.6  PLT 332 280 237 252 281   Cardiac Enzymes: No results found for this basename: CKTOTAL, CKMB, CKMBINDEX, TROPONINI,  in the last 168 hours BNP (last 3 results) No results found for this basename: PROBNP,  in the last 8760 hours CBG: No results found for this basename: GLUCAP,  in the last 168 hours  Recent Results (from the past 240 hour(s))  CULTURE, BLOOD (ROUTINE X 2)     Status: None   Collection Time    05/04/13  8:41 PM      Result Value  Ref Range Status   Specimen Description BLOOD LEFT ANTECUBITAL   Final   Special Requests BOTTLES DRAWN AEROBIC AND ANAEROBIC 8ML   Final   Culture  Setup Time     Final   Value: 05/05/2013 01:09     Performed at [facility]   Culture     Final   Value: SERRATIA MARCESCENS     Note: SUSCEPTIBILITIES PERFORMED ON PREVIOUS CULTURE WITHIN THE LAST 5 DAYS.     Note: Gram Stain Report Called to,Read Back By and Verified With: Veleda HUFF 05/05/13 @ 6:28PM BY RUSCOE A.     Performed at [facility]   Report Status 05/07/2013 FINAL   Final  CULTURE, BLOOD (ROUTINE X 2)     Status: None   Collection Time    05/04/13  9:40 PM      Result Value Ref Range Status   Specimen Description BLOOD RIGHT PORTA CATH   Final   Special Requests BOTTLES DRAWN AEROBIC AND ANAEROBIC 5CC   Final   Culture  Setup Time     Final   Value: 05/05/2013 01:09     Performed at [facility]   Culture     Final   Value: SERRATIA MARCESCENS     Note: Gram Stain Report Called to,Read Back By and Verified With: [name] 05/05/13 @ 6:28PM BY RUSCOE A.     Performed at [facility]   Report Status 05/07/2013 FINAL   Final   Organism ID, Bacteria SERRATIA MARCESCENS   Final  CULTURE, BLOOD (ROUTINE X 2)     Status: None   Collection Time    05/07/13  4:25 PM      Result Value Ref Range Status   Specimen Description BLOOD RIGHT ARM   Final   Special Requests BOTTLES DRAWN AEROBIC AND ANAEROBIC 10CC EACH   Final   Culture  Setup Time     Final   Value: 05/07/2013 20:56     Performed at [facility]   Culture     Final   Value:        BLOOD CULTURE RECEIVED NO GROWTH TO DATE CULTURE WILL BE HELD FOR 5 DAYS BEFORE ISSUING A FINAL NEGATIVE REPORT     Performed at [facility]   Report Status PENDING   Incomplete  CULTURE, BLOOD (ROUTINE X 2)     Status: None   Collection Time    05/07/13  4:36 PM      Result Value Ref Range Status   Specimen Description BLOOD LEFT ARM    Final  Special Requests BOTTLES DRAWN AEROBIC AND ANAEROBIC [facility] EACH   Final   Culture  Setup Time     Final   Value: 05/08/2013 13:28     Performed at [facility]   Culture     Final   Value:        BLOOD CULTURE RECEIVED NO GROWTH TO DATE CULTURE WILL BE HELD FOR 5 DAYS BEFORE ISSUING A FINAL NEGATIVE REPORT     Performed at [facility]   Report Status PENDING   Incomplete  CLOSTRIDIUM DIFFICILE BY PCR     Status: None   Collection Time    05/08/13  5:12 AM      Result Value Ref Range Status   C difficile by pcr NEGATIVE  NEGATIVE Final   Comment: Performed at [facility]  BODY FLUID CULTURE     Status: None   Collection Time    05/08/13  2:20 PM      Result Value Ref Range Status   Specimen Description LIVER BILE FROM BILIARY DRAINS LEFT   Final   Special Requests Normal   Final   Gram Stain     Final   Value: NO WBC SEEN     NO ORGANISMS SEEN     Performed at [facility]   Culture     Final   Value: FEW GRAM NEGATIVE RODS     Note: CRITICAL RESULT CALLED TO, READ BACK BY AND VERIFIED WITH: BESS B$RemoveBef'@10'YXsjnEdsPQ$ :13AM ON 05/09/13 BY DANTS     Performed at [facility]   Report Status PENDING   Incomplete  BODY FLUID CULTURE     Status: None   Collection Time    05/08/13  2:40 PM      Result Value Ref Range Status   Specimen Description LIVER Bile from biliary drain RIGHT   Final   Special Requests NONE   Final   Gram Stain     Final   Value: NO WBC SEEN     NO ORGANISMS SEEN     Performed at [facility]   Culture     Final   Value: FEW GRAM NEGATIVE RODS     Note: CRITICAL RESULT CALLED TO, READ BACK BY AND VERIFIED WITH: BESS B$RemoveBef'@10'NgrEdgSsdN$ :13AM ON 05/09/13 BY DANTS     Performed at [facility]   Report Status PENDING   Incomplete     Studies: Ir Catheter Tube Change  05/09/2013   INDICATION History of cervical cancer with metastatic disease to the liver and now with malignant obstructive jaundice. Post left (04/09/2013) and  right-sided (04/27/2013) percutaneous biliary drainage catheter, with improvement and serum bilirubin though there has been persistent drainage around the recently placed a right-sided biliary drainage catheter.  EXAM FLUOROSCOPIC GUIDED RIGHT SIDED BILIARY DRAINAGE CATHETER EXCHANGE AND UPSIZING  COMPARISON IR CATHETER TUBE CHANGE dated 04/27/2013; CT ABD/PELVIS W CM dated 05/05/2013; IR CHOLAN EXIST TUBE dated 04/23/2013; IR PTC dated 04/09/2013; CT ABD/PELVIS W CM dated 04/05/2013  CONTRAST 20 mL Isovue-300 administered into the biliary system.  FLUOROSCOPY TIME 3 minutes 30 seconds  COMPLICATIONS None immediate  TECHNIQUE Informed written consent was obtained from the patient after a discussion of the risks, benefits and alternatives to treatment. Questions regarding the procedure were encouraged and answered. A timeout was performed prior to the initiation of the procedure.  The right upper abdominal quadrant and external portion of existing right-sided biliary drainage catheter were prepped and draped in the usual  sterile fashion. A sterile drape was applied covering the operative field. Maximum barrier sterile technique with sterile gowns and gloves were used for the procedure. A timeout was performed prior to the initiation of the procedure.  A preprocedural spot fluoroscopic image was obtained. Multiple spot fluoroscopic and radiographic images were obtained after the injection of a small amount of contrast via the existing right-sided biliary drainage catheter.  The existing biliary drainage catheter was cut and cannulated with a stiff Glidewire which was coiled within the proximal aspect of the duodenum. . Under intermittent fluoroscopic guidance, the existing biliary drainage catheter was exchanged for a new, slightly larger 12 [place] all-purpose drainage catheter. Note, approximately 6 cm of additional sideholes were cut peripheral to the radiopaque marker.  A minimal amount of contrast was injected and a post  exchange radiograph was obtained. The catheter was locked and secured to the skin with a suture. A dressing was placed. The patient tolerated the procedure well without immediate postprocedural complication.  FINDINGS The existing right-sided biliary drainage catheter is appropriately positioned and functioning with passage of contrast through the distal end of the catheter and into the proximal duodenum. A very minimal amount of contrast was noted to reflux along the external portion of the biliary drainage catheter about the hepatic capsule.  Grossly unchanged positioning of existing left-sided biliary drainage catheter. Dedicated left-sided biliary drainage injection was not performed as the patient is not currently complaining drainage around the left-sided biliary drainage catheter.  After successful fluoroscopic exchange, a new, slightly larger, now 67 French percutaneous biliary drainage catheter is positioned with end coiled and locked within the proximal duodenum. Note, approximately 6 cm of additional sideholes were cut peripheral to the radiopaque marker.  IMPRESSION Successful fluoroscopic guided exchange and up sizing of right sided, now 63 French percutaneous nephrostomy catheter with approximately 6 cm of additional sideholes cut peripheral to the radiopaque marker for persistent peri-catheter leakage.  PLAN Given progression of hilar predominant hepatic metastatic disease, this patient is not a candidate for internal biliary stent placement and thus routine biliary exchanges should be performed on a q 6 week basis.  SIGNATURE  Electronically Signed   By: Sandi Mariscal M.D.   On: 05/09/2013 12:03    Scheduled Meds: . ceFEPime (MAXIPIME) IV  1 g Intravenous Q12H  . citalopram  20 mg Oral Daily  . diltiazem  180 mg Oral Daily  . docusate sodium  100 mg Oral BID  . fentaNYL  200 mcg Transdermal Q72H  . fentaNYL      . loratadine  10 mg Oral Daily  . metoprolol tartrate  25 mg Oral BID  .  midazolam      . pantoprazole  40 mg Oral Daily  . potassium chloride  10 mEq Oral BID  . Rivaroxaban  20 mg Oral QPM  . sodium chloride  3 mL Intravenous Q12H   Continuous Infusions: . sodium chloride 100 mL/hr at 05/09/13 0755     Time spent: 35 minutes    [facility] Pager [phone]  If 7PM-7AM, please contact night-coverage at [email or web address], password [facility] 05/09/2013, 2:46 PM  LOS: 5 days

## 2013-05-09 NOTE — Progress Notes (Signed)
INFECTIOUS DISEASE PROGRESS NOTE  ID: [name] is a 63 y.o. female with  Active Problems:   Atrial fibrillation   Cervical cancer   Anemia of chronic disease   Hyponatremia   Protein-calorie malnutrition, severe   Abdominal pain, unspecified site   Malignant obstructive jaundice   Ascending cholangitis   Cholangitis   Sepsis   History of DVT (deep vein thrombosis)   Gram-negative bacteremia   Hypophosphatemia   Hypokalemia  Subjective: Hepatic drain changed yesterday  Abtx:  Anti-infectives   Start     Dose/Rate Route Frequency Ordered Stop   05/07/13 1600  ceFEPIme (MAXIPIME) 1 g in dextrose 5 % 50 mL IVPB     1 g 100 mL/hr over 30 Minutes Intravenous Every 12 hours 05/07/13 1534     05/05/13 1000  vancomycin (VANCOCIN) IVPB 1000 mg/200 mL premix  Status:  Discontinued     1,000 mg 200 mL/hr over 60 Minutes Intravenous Every 12 hours 05/05/13 0344 05/07/13 1534   05/05/13 0000  vancomycin (VANCOCIN) IVPB 1000 mg/200 mL premix     1,000 mg 200 mL/hr over 60 Minutes Intravenous  Once 05/04/13 2355 05/05/13 0124   05/04/13 2345  piperacillin-tazobactam (ZOSYN) IVPB 3.375 g  Status:  Discontinued     3.375 g 12.5 mL/hr over 240 Minutes Intravenous 3 times per day 05/04/13 2336 05/07/13 1534      Medications:  Scheduled: . ceFEPime (MAXIPIME) IV  1 g Intravenous Q12H  . citalopram  20 mg Oral Daily  . diltiazem  180 mg Oral Daily  . docusate sodium  100 mg Oral BID  . fentaNYL  200 mcg Transdermal Q72H  . fentaNYL      . loratadine  10 mg Oral Daily  . metoprolol tartrate  25 mg Oral BID  . pantoprazole  40 mg Oral Daily  . potassium chloride  10 mEq Oral BID  . Rivaroxaban  20 mg Oral QPM  . sodium chloride  3 mL Intravenous Q12H    Objective: Vital signs in last 24 hours: Temp:  [97.5 F (36.4 C)-97.7 F (36.5 C)] 97.7 F (36.5 C) (03/11 1500) Pulse Rate:  [77-134] 106 (03/11 1500) Resp:  [13-21] 18 (03/11 1500) BP: (124-197)/(73-109) 136/77  mmHg (03/11 1500) SpO2:  [95 %-98 %] 97 % (03/11 1500)  Did not examine Lab Results  Recent Labs  05/07/13 0430 05/09/13 0515  WBC 5.3 6.1  HGB 8.3* 9.0*  HCT 24.8* 27.9*  NA 135* 135*  K 3.3* 3.5*  CL 102 101  CO2 22 23  BUN 5* 4*  CREATININE 0.59 0.57   Liver Panel  Recent Labs  05/09/13 0515  PROT 5.5*  ALBUMIN 1.7*  AST 18  ALT 16  ALKPHOS 589*  BILITOT 2.9*  BILIDIR 1.9*  IBILI 1.0*   Sedimentation Rate No results found for this basename: ESRSEDRATE,  in the last 72 hours C-Reactive Protein No results found for this basename: CRP,  in the last 72 hours  Microbiology: Recent Results (from the past 240 hour(s))  CULTURE, BLOOD (ROUTINE X 2)     Status: None   Collection Time    05/04/13  8:41 PM      Result Value Ref Range Status   Specimen Description BLOOD LEFT ANTECUBITAL   Final   Special Requests BOTTLES DRAWN AEROBIC AND ANAEROBIC 8ML   Final   Culture  Setup Time     Final   Value: 05/05/2013 01:09     Performed  at [facility]     Final   Value: SERRATIA MARCESCENS     Note: SUSCEPTIBILITIES PERFORMED ON PREVIOUS CULTURE WITHIN THE LAST 5 DAYS.     Note: Gram Stain Report Called to,Read Back By and Verified With: Rashelle HUFF 05/05/13 @ 6:28PM BY RUSCOE A.     Performed at [facility]   Report Status 05/07/2013 FINAL   Final  CULTURE, BLOOD (ROUTINE X 2)     Status: None   Collection Time    05/04/13  9:40 PM      Result Value Ref Range Status   Specimen Description BLOOD RIGHT PORTA CATH   Final   Special Requests BOTTLES DRAWN AEROBIC AND ANAEROBIC 5CC   Final   Culture  Setup Time     Final   Value: 05/05/2013 01:09     Performed at [facility]   Culture     Final   Value: SERRATIA MARCESCENS     Note: Gram Stain Report Called to,Read Back By and Verified With: [name] 05/05/13 @ 6:28PM BY RUSCOE A.     Performed at [facility]   Report Status 05/07/2013 FINAL   Final   Organism ID, Bacteria  SERRATIA MARCESCENS   Final  CULTURE, BLOOD (ROUTINE X 2)     Status: None   Collection Time    05/07/13  4:25 PM      Result Value Ref Range Status   Specimen Description BLOOD RIGHT ARM   Final   Special Requests BOTTLES DRAWN AEROBIC AND ANAEROBIC 10CC EACH   Final   Culture  Setup Time     Final   Value: 05/07/2013 20:56     Performed at [facility]   Culture     Final   Value:        BLOOD CULTURE RECEIVED NO GROWTH TO DATE CULTURE WILL BE HELD FOR 5 DAYS BEFORE ISSUING A FINAL NEGATIVE REPORT     Performed at [facility]   Report Status PENDING   Incomplete  CULTURE, BLOOD (ROUTINE X 2)     Status: None   Collection Time    05/07/13  4:36 PM      Result Value Ref Range Status   Specimen Description BLOOD LEFT ARM   Final   Special Requests BOTTLES DRAWN AEROBIC AND ANAEROBIC 5CC EACH   Final   Culture  Setup Time     Final   Value: 05/08/2013 13:28     Performed at [facility]   Culture     Final   Value:        BLOOD CULTURE RECEIVED NO GROWTH TO DATE CULTURE WILL BE HELD FOR 5 DAYS BEFORE ISSUING A FINAL NEGATIVE REPORT     Performed at [facility]   Report Status PENDING   Incomplete  CLOSTRIDIUM DIFFICILE BY PCR     Status: None   Collection Time    05/08/13  5:12 AM      Result Value Ref Range Status   C difficile by pcr NEGATIVE  NEGATIVE Final   Comment: Performed at [facility]  BODY FLUID CULTURE     Status: None   Collection Time    05/08/13  2:20 PM      Result Value Ref Range Status   Specimen Description LIVER BILE FROM BILIARY DRAINS LEFT   Final   Special Requests Normal   Final   Gram Stain  Final   Value: NO WBC SEEN     NO ORGANISMS SEEN     Performed at [facility]   Culture     Final   Value: FEW GRAM NEGATIVE RODS     Note: CRITICAL RESULT CALLED TO, READ BACK BY AND VERIFIED WITH: BESS B_0 :13AM ON 05/09/13 BY DANTS     Performed at [facility]   Report Status PENDING    Incomplete  BODY FLUID CULTURE     Status: None   Collection Time    05/08/13  2:40 PM      Result Value Ref Range Status   Specimen Description LIVER Bile from biliary drain RIGHT   Final   Special Requests NONE   Final   Gram Stain     Final   Value: NO WBC SEEN     NO ORGANISMS SEEN     Performed at [facility]   Culture     Final   Value: FEW GRAM NEGATIVE RODS     Note: CRITICAL RESULT CALLED TO, READ BACK BY AND VERIFIED WITH: BESS B_1 :13AM ON 05/09/13 BY DANTS     Performed at [facility]   Report Status PENDING   Incomplete    Studies/Results: Ir Catheter Tube Change  05/09/2013   INDICATION History of cervical cancer with metastatic disease to the liver and now with malignant obstructive jaundice. Post left (04/09/2013) and right-sided (04/27/2013) percutaneous biliary drainage catheter, with improvement and serum bilirubin though there has been persistent drainage around the recently placed a right-sided biliary drainage catheter.  EXAM FLUOROSCOPIC GUIDED RIGHT SIDED BILIARY DRAINAGE CATHETER EXCHANGE AND UPSIZING  COMPARISON IR CATHETER TUBE CHANGE dated 04/27/2013; CT ABD/PELVIS W CM dated 05/05/2013; IR CHOLAN EXIST TUBE dated 04/23/2013; IR PTC dated 04/09/2013; CT ABD/PELVIS W CM dated 04/05/2013  CONTRAST 20 mL Isovue-300 administered into the biliary system.  FLUOROSCOPY TIME 3 minutes 30 seconds  COMPLICATIONS None immediate  TECHNIQUE Informed written consent was obtained from the patient after a discussion of the risks, benefits and alternatives to treatment. Questions regarding the procedure were encouraged and answered. A timeout was performed prior to the initiation of the procedure.  The right upper abdominal quadrant and external portion of existing right-sided biliary drainage catheter were prepped and draped in the usual sterile fashion. A sterile drape was applied covering the operative field. Maximum barrier sterile technique with sterile gowns and gloves  were used for the procedure. A timeout was performed prior to the initiation of the procedure.  A preprocedural spot fluoroscopic image was obtained. Multiple spot fluoroscopic and radiographic images were obtained after the injection of a small amount of contrast via the existing right-sided biliary drainage catheter.  The existing biliary drainage catheter was cut and cannulated with a stiff Glidewire which was coiled within the proximal aspect of the duodenum. . Under intermittent fluoroscopic guidance, the existing biliary drainage catheter was exchanged for a new, slightly larger 12 [place] all-purpose drainage catheter. Note, approximately 6 cm of additional sideholes were cut peripheral to the radiopaque marker.  A minimal amount of contrast was injected and a post exchange radiograph was obtained. The catheter was locked and secured to the skin with a suture. A dressing was placed. The patient tolerated the procedure well without immediate postprocedural complication.  FINDINGS The existing right-sided biliary drainage catheter is appropriately positioned and functioning with passage of contrast through the distal end of the catheter and into the proximal duodenum. A very minimal amount  of contrast was noted to reflux along the external portion of the biliary drainage catheter about the hepatic capsule.  Grossly unchanged positioning of existing left-sided biliary drainage catheter. Dedicated left-sided biliary drainage injection was not performed as the patient is not currently complaining drainage around the left-sided biliary drainage catheter.  After successful fluoroscopic exchange, a new, slightly larger, now 54 French percutaneous biliary drainage catheter is positioned with end coiled and locked within the proximal duodenum. Note, approximately 6 cm of additional sideholes were cut peripheral to the radiopaque marker.  IMPRESSION Successful fluoroscopic guided exchange and up sizing of right sided,  now 100 French percutaneous nephrostomy catheter with approximately 6 cm of additional sideholes cut peripheral to the radiopaque marker for persistent peri-catheter leakage.  PLAN Given progression of hilar predominant hepatic metastatic disease, this patient is not a candidate for internal biliary stent placement and thus routine biliary exchanges should be performed on a q 6 week basis.  SIGNATURE  Electronically Signed   By: Sandi Mariscal M.D.   On: 05/09/2013 12:03     Assessment/Plan: Serratia Bacteremia  Cervical Ca, stage IV   Liver metastases, biliary obstruction Loose BM (C diff -)  Total days of antibiotics: 4 (cefepime)  Hepatic abscess = prelim cx showing GNR. Awaiting identification. Continue with cefepime  Serratia bacteremia = appears that port is also involved since initial cx had identified from port and peripheral blood cx. Repeat blood cx appear NGTD. Continue with cefepime for now. Will discuss need to remove port. Would recommend removal if we have recurrence of serratia bacteremia.          SNIDER, CYNTHIA Infectious Diseases (pager) [phone] [email or web address] 05/09/2013, 4:33 PM  LOS: 5 days

## 2013-05-09 NOTE — Procedures (Signed)
Successful replacement and upsizing of a right sided approach transhepatic12 Fr biliary drainage catheter with end coiled and locked within the duodenum.  Biliary drain connected to gravity bag. No immediate post procedural complications.

## 2013-05-10 LAB — BASIC METABOLIC PANEL
BUN: 5 mg/dL — ABNORMAL LOW (ref 6–23)
CO2: 22 meq/L (ref 19–32)
CREATININE: 0.53 mg/dL (ref 0.50–1.10)
Calcium: 8.3 mg/dL — ABNORMAL LOW (ref 8.4–10.5)
Chloride: 97 mEq/L (ref 96–112)
GFR calc non Af Amer: >90 mL/min (ref >90)
Glucose, Bld: 107 mg/dL — ABNORMAL HIGH (ref 70–99)
Potassium: 3.2 mEq/L — ABNORMAL LOW (ref 3.7–5.3)
SODIUM: 132 meq/L — AB (ref 137–147)

## 2013-05-10 LAB — CBC
HCT: 29.4 % — ABNORMAL LOW (ref 36.0–46.0)
Hemoglobin: 9.5 g/dL — ABNORMAL LOW (ref 12.0–15.0)
MCH: 29.2 pg (ref 26.0–34.0)
MCHC: 32.3 g/dL (ref 30.0–36.0)
MCV: 90.5 fL (ref 78.0–100.0)
Platelets: 290 10*3/uL (ref 150–400)
RBC: 3.25 MIL/uL — AB (ref 3.87–5.11)
RDW: 14 % (ref 11.5–15.5)
WBC: 8 10*3/uL (ref 4.0–10.5)

## 2013-05-10 MED ORDER — SODIUM CHLORIDE 0.9 % IV SOLN
100.0000 mg | INTRAVENOUS | Status: DC
  Administered 2013-05-10 – 2013-05-13 (×4): 100 mg via INTRAVENOUS
  Filled 2013-05-10 (×7): qty 100

## 2013-05-10 NOTE — Procedures (Signed)
[name] [facility] evaluated patient's biliary drain site.  Below is his progress note dictated in [facility]:   "Pt returned for eval of biliary drain recently placed.  She noted some leaking from site.  Both biliary drain sites look good.  Small amount bilious staining at right site.  Drain was irrigated with NS. Initially took a little extra force, which may have dislodged some debris. This also alleviated some pain she was having. The drain flushed quite easily after that.  A new bag was attached to each drain.  We also reset the stat-lock to allow a little more slack.  She will call with further issues.  [name] Dike PA-C  Interventional Radiology  05/03/2013  10:08 AM"

## 2013-05-10 NOTE — Progress Notes (Signed)
The patient's biliary drain was flushed.The patient tolerated this well.  The dressing around the biliary drain was changed.The pt. tolerated this well.  Will continue to monitor the pt.

## 2013-05-10 NOTE — Progress Notes (Signed)
[facility] PROGRESS NOTE  LOYAL RUDY UMP:[id number] DOB: [birth year] DOA: 05/04/2013 PCP: Gwendolyn Grant, MD  Brief narrative:  Patient is a 63 year old with history of cervical cancer diagnosed in 2005 status post total hysterectomy with recurrence in 2010 status post chemotherapy and radiation treatment found to have metastatic spread to the liver since 2012. The patient subsequently developed biliary obstruction of which stenting was performed by interventional radiology. Patient presents for this admission with sepsis and gram-negative bacteremia growing Serratia marcescens.   Assessment/Plan:  Sepsis/ gram negative bacteremia - Will continue broad spectrum antibiotics at this juncture - Pt growing: SERRATIA MARCESCENS -Repeat BC (3/9) pending. If negative, we may be able to salvage her port. -Await ID input re: length of antibiotic treatment. -Sepsis parameters resolved.    Atrial fibrillation - At this point we'll continue metoprolol and diltiazem. - Currently patient is on xarelto    Cervical cancer - Pt to follow up with oncologist on discharge    Anemia of chronic disease - Hemoglobin currently stable    Hyponatremia - Most likely due to poor oral solute intake. Currently improving and suspect continued improvement with improvement in oral intake.     Protein-calorie malnutrition, severe - Currently on regular diet    History of DVT (deep vein thrombosis) - Patient is currently on xarelto  Code Status: full Family Communication: No family at bedside Disposition Plan: To be determined   Consultants:  IR  ID  Procedures:  None  Antibiotics:  Patient is on vancomycin and Zosyn  HPI/Subjective: Still with some pain at drain site.  Objective: Filed Vitals:   05/10/13 1344  BP: 131/74  Pulse: 86  Temp: 98.1 F (36.7 C)  Resp: 18    Intake/Output Summary (Last 24 hours) at 05/10/13 1456 Last data filed at 05/10/13 0455  Gross per 24  hour  Intake    410 ml  Output   1990 ml  Net  -1580 ml   Filed Weights   05/04/13 1931 05/04/13 2342  Weight: 72.576 kg (160 lb) 71.578 kg (157 lb 12.8 oz)    Exam:   General:  alert and awake  Cardiovascular: RRR, no MRG  Respiratory: CTA BL, no wheezes  Abdomen: increased pain today, nondistended, hypoactive bowel sounds   Musculoskeletal: no cyanosis or clubbing   Data Reviewed: Basic Metabolic Panel:  Recent Labs Lab 05/04/13 2041 05/05/13 0355 05/07/13 0430 05/08/13 0350 05/09/13 0515 05/10/13 0515  NA 129* 130* 135*  --  135* 132*  K 3.6* 3.2* 3.3*  --  3.5* 3.2*  CL 96 97 102  --  101 97  CO2 $Re'20 22 22  'ifs$ --  23 22  GLUCOSE 111* 88 92  --  108* 107*  BUN 12 10 5*  --  4* 5*  CREATININE 0.71 0.58 0.59  --  0.57 0.53  CALCIUM 8.4 7.9* 7.7*  --  8.3* 8.3*  MG  --  1.6  --   --   --   --   PHOS  --  2.0*  --  2.1* 2.5  --    Liver Function Tests:  Recent Labs Lab 05/03/13 1501 05/04/13 2041 05/05/13 0355 05/09/13 0515  AST $Re'29 25 21 18  'jNN$ ALT 32 $Remo'27 24 16  'WuRUQ$ ALKPHOS 639* 601* 484* 589*  BILITOT 4.41* 4.5* 4.0* 2.9*  PROT 6.5 6.4 5.3* 5.5*  ALBUMIN 2.1* 1.9* 1.7* 1.7*    Recent Labs Lab 05/04/13 2041  LIPASE 9*   No results found for  this basename: AMMONIA,  in the last 168 hours CBC:  Recent Labs Lab 05/03/13 1501 05/04/13 2041 05/05/13 0355 05/07/13 0430 05/09/13 0515 05/10/13 0515  WBC 6.7 8.5 6.2 5.3 6.1 8.0  NEUTROABS 5.2 6.9  --   --   --   --   HGB 10.2* 9.5* 8.4* 8.3* 9.0* 9.5*  HCT 30.9* 28.1* 25.7* 24.8* 27.9* 29.4*  MCV 92.8 90.9 91.8 91.9 90.6 90.5  PLT 332 280 237 252 281 290   Cardiac Enzymes: No results found for this basename: CKTOTAL, CKMB, CKMBINDEX, TROPONINI,  in the last 168 hours BNP (last 3 results) No results found for this basename: PROBNP,  in the last 8760 hours CBG: No results found for this basename: GLUCAP,  in the last 168 hours  Recent Results (from the past 240 hour(s))  CULTURE, BLOOD (ROUTINE X 2)      Status: None   Collection Time    05/04/13  8:41 PM      Result Value Ref Range Status   Specimen Description BLOOD LEFT ANTECUBITAL   Final   Special Requests BOTTLES DRAWN AEROBIC AND ANAEROBIC 8ML   Final   Culture  Setup Time     Final   Value: 05/05/2013 01:09     Performed at [facility]   Culture     Final   Value: SERRATIA MARCESCENS     Note: SUSCEPTIBILITIES PERFORMED ON PREVIOUS CULTURE WITHIN THE LAST 5 DAYS.     Note: Gram Stain Report Called to,Read Back By and Verified With: Arretta HUFF 05/05/13 @ 6:28PM BY RUSCOE A.     Performed at [facility]   Report Status 05/07/2013 FINAL   Final  CULTURE, BLOOD (ROUTINE X 2)     Status: None   Collection Time    05/04/13  9:40 PM      Result Value Ref Range Status   Specimen Description BLOOD RIGHT PORTA CATH   Final   Special Requests BOTTLES DRAWN AEROBIC AND ANAEROBIC 5CC   Final   Culture  Setup Time     Final   Value: 05/05/2013 01:09     Performed at [facility]   Culture     Final   Value: SERRATIA MARCESCENS     Note: Gram Stain Report Called to,Read Back By and Verified With: [name] 05/05/13 @ 6:28PM BY RUSCOE A.     Performed at [facility]   Report Status 05/07/2013 FINAL   Final   Organism ID, Bacteria SERRATIA MARCESCENS   Final  CULTURE, BLOOD (ROUTINE X 2)     Status: None   Collection Time    05/07/13  4:25 PM      Result Value Ref Range Status   Specimen Description BLOOD RIGHT ARM   Final   Special Requests BOTTLES DRAWN AEROBIC AND ANAEROBIC 10CC EACH   Final   Culture  Setup Time     Final   Value: 05/07/2013 20:56     Performed at [facility]   Culture     Final   Value:        BLOOD CULTURE RECEIVED NO GROWTH TO DATE CULTURE WILL BE HELD FOR 5 DAYS BEFORE ISSUING A FINAL NEGATIVE REPORT     Performed at [facility]   Report Status PENDING   Incomplete  CULTURE, BLOOD (ROUTINE X 2)     Status: None   Collection Time    05/07/13  4:36 PM  Result Value Ref Range Status   Specimen Description BLOOD LEFT ARM   Final   Special Requests BOTTLES DRAWN AEROBIC AND ANAEROBIC [facility] EACH   Final   Culture  Setup Time     Final   Value: 05/08/2013 13:28     Performed at [facility]   Culture     Final   Value:        BLOOD CULTURE RECEIVED NO GROWTH TO DATE CULTURE WILL BE HELD FOR 5 DAYS BEFORE ISSUING A FINAL NEGATIVE REPORT     Performed at [facility]   Report Status PENDING   Incomplete  CLOSTRIDIUM DIFFICILE BY PCR     Status: None   Collection Time    05/08/13  5:12 AM      Result Value Ref Range Status   C difficile by pcr NEGATIVE  NEGATIVE Final   Comment: Performed at [facility]  BODY FLUID CULTURE     Status: None   Collection Time    05/08/13  2:20 PM      Result Value Ref Range Status   Specimen Description LIVER BILE FROM BILIARY DRAINS LEFT   Final   Special Requests Normal   Final   Gram Stain     Final   Value: NO WBC SEEN     NO ORGANISMS SEEN     Performed at [facility]   Culture     Final   Value: FEW ENTEROBACTER CLOACAE     Note: CRITICAL RESULT CALLED TO, READ BACK BY AND VERIFIED WITH: BESS B$RemoveBef'@10'pSAPyeIXRJ$ :13AM ON 05/09/13 BY DANTS     FEW YEAST     Performed at [facility]   Report Status PENDING   Incomplete  BODY FLUID CULTURE     Status: None   Collection Time    05/08/13  2:40 PM      Result Value Ref Range Status   Specimen Description LIVER Bile from biliary drain RIGHT   Final   Special Requests NONE   Final   Gram Stain     Final   Value: NO WBC SEEN     NO ORGANISMS SEEN     Performed at [facility]   Culture     Final   Value: FEW ENTEROBACTER CLOACAE     Note: CRITICAL RESULT CALLED TO, READ BACK BY AND VERIFIED WITH: BESS B$RemoveBef'@10'ZqkoPyLGbm$ :13AM ON 05/09/13 BY DANTS     FEW YEAST     Performed at [facility]   Report Status PENDING   Incomplete     Studies: Ir Catheter Tube Change  05/09/2013   INDICATION History of cervical cancer with  metastatic disease to the liver and now with malignant obstructive jaundice. Post left (04/09/2013) and right-sided (04/27/2013) percutaneous biliary drainage catheter, with improvement and serum bilirubin though there has been persistent drainage around the recently placed a right-sided biliary drainage catheter.  EXAM FLUOROSCOPIC GUIDED RIGHT SIDED BILIARY DRAINAGE CATHETER EXCHANGE AND UPSIZING  COMPARISON IR CATHETER TUBE CHANGE dated 04/27/2013; CT ABD/PELVIS W CM dated 05/05/2013; IR CHOLAN EXIST TUBE dated 04/23/2013; IR PTC dated 04/09/2013; CT ABD/PELVIS W CM dated 04/05/2013  CONTRAST 20 mL Isovue-300 administered into the biliary system.  FLUOROSCOPY TIME 3 minutes 30 seconds  COMPLICATIONS None immediate  TECHNIQUE Informed written consent was obtained from the patient after a discussion of the risks, benefits and alternatives to treatment. Questions regarding the procedure were encouraged and answered. A timeout was performed prior to  the initiation of the procedure.  The right upper abdominal quadrant and external portion of existing right-sided biliary drainage catheter were prepped and draped in the usual sterile fashion. A sterile drape was applied covering the operative field. Maximum barrier sterile technique with sterile gowns and gloves were used for the procedure. A timeout was performed prior to the initiation of the procedure.  A preprocedural spot fluoroscopic image was obtained. Multiple spot fluoroscopic and radiographic images were obtained after the injection of a small amount of contrast via the existing right-sided biliary drainage catheter.  The existing biliary drainage catheter was cut and cannulated with a stiff Glidewire which was coiled within the proximal aspect of the duodenum. . Under intermittent fluoroscopic guidance, the existing biliary drainage catheter was exchanged for a new, slightly larger 12 [place] all-purpose drainage catheter. Note, approximately 6 cm of additional  sideholes were cut peripheral to the radiopaque marker.  A minimal amount of contrast was injected and a post exchange radiograph was obtained. The catheter was locked and secured to the skin with a suture. A dressing was placed. The patient tolerated the procedure well without immediate postprocedural complication.  FINDINGS The existing right-sided biliary drainage catheter is appropriately positioned and functioning with passage of contrast through the distal end of the catheter and into the proximal duodenum. A very minimal amount of contrast was noted to reflux along the external portion of the biliary drainage catheter about the hepatic capsule.  Grossly unchanged positioning of existing left-sided biliary drainage catheter. Dedicated left-sided biliary drainage injection was not performed as the patient is not currently complaining drainage around the left-sided biliary drainage catheter.  After successful fluoroscopic exchange, a new, slightly larger, now 93 French percutaneous biliary drainage catheter is positioned with end coiled and locked within the proximal duodenum. Note, approximately 6 cm of additional sideholes were cut peripheral to the radiopaque marker.  IMPRESSION Successful fluoroscopic guided exchange and up sizing of right sided, now 45 French percutaneous nephrostomy catheter with approximately 6 cm of additional sideholes cut peripheral to the radiopaque marker for persistent peri-catheter leakage.  PLAN Given progression of hilar predominant hepatic metastatic disease, this patient is not a candidate for internal biliary stent placement and thus routine biliary exchanges should be performed on a q 6 week basis.  SIGNATURE  Electronically Signed   By: Sandi Mariscal M.D.   On: 05/09/2013 12:03    Scheduled Meds: . ceFEPime (MAXIPIME) IV  1 g Intravenous Q12H  . citalopram  20 mg Oral Daily  . diltiazem  180 mg Oral Daily  . docusate sodium  100 mg Oral BID  . fentaNYL  200 mcg  Transdermal Q72H  . loratadine  10 mg Oral Daily  . metoprolol tartrate  25 mg Oral BID  . pantoprazole  40 mg Oral Daily  . potassium chloride  10 mEq Oral BID  . Rivaroxaban  20 mg Oral QPM  . sodium chloride  3 mL Intravenous Q12H   Continuous Infusions: . sodium chloride 100 mL/hr at 05/10/13 0456     Time spent: 25 minutes    [facility] Pager [phone]  If 7PM-7AM, please contact night-coverage at [email or web address], password [facility] 05/10/2013, 2:56 PM  LOS: 6 days

## 2013-05-10 NOTE — Progress Notes (Signed)
INFECTIOUS DISEASE PROGRESS NOTE  ID: [name] is a 63 y.o. female with  Active Problems:   Atrial fibrillation   Cervical cancer   Anemia of chronic disease   Hyponatremia   Protein-calorie malnutrition, severe   Abdominal pain, unspecified site   Malignant obstructive jaundice   Ascending cholangitis   Cholangitis   Sepsis   History of DVT (deep vein thrombosis)   Gram-negative bacteremia   Hypophosphatemia   Hypokalemia  Subjective: Afebrile but intermittent abd pain.  cx results from hepatic drain show enterobacter plus yeast  Abtx:  Anti-infectives   Start     Dose/Rate Route Frequency Ordered Stop   05/10/13 1545  micafungin (MYCAMINE) 100 mg in sodium chloride 0.9 % 100 mL IVPB     100 mg 100 mL/hr over 1 Hours Intravenous Daily 05/10/13 1533     05/07/13 1600  ceFEPIme (MAXIPIME) 1 g in dextrose 5 % 50 mL IVPB     1 g 100 mL/hr over 30 Minutes Intravenous Every 12 hours 05/07/13 1534     05/05/13 1000  vancomycin (VANCOCIN) IVPB 1000 mg/200 mL premix  Status:  Discontinued     1,000 mg 200 mL/hr over 60 Minutes Intravenous Every 12 hours 05/05/13 0344 05/07/13 1534   05/05/13 0000  vancomycin (VANCOCIN) IVPB 1000 mg/200 mL premix     1,000 mg 200 mL/hr over 60 Minutes Intravenous  Once 05/04/13 2355 05/05/13 0124   05/04/13 2345  piperacillin-tazobactam (ZOSYN) IVPB 3.375 g  Status:  Discontinued     3.375 g 12.5 mL/hr over 240 Minutes Intravenous 3 times per day 05/04/13 2336 05/07/13 1534      Medications:  Scheduled: . ceFEPime (MAXIPIME) IV  1 g Intravenous Q12H  . citalopram  20 mg Oral Daily  . diltiazem  180 mg Oral Daily  . docusate sodium  100 mg Oral BID  . fentaNYL  200 mcg Transdermal Q72H  . loratadine  10 mg Oral Daily  . metoprolol tartrate  25 mg Oral BID  . micafungin [facility]) IV  100 mg Intravenous Daily  . pantoprazole  40 mg Oral Daily  . potassium chloride  10 mEq Oral BID  . Rivaroxaban  20 mg Oral QPM  . sodium  chloride  3 mL Intravenous Q12H    Objective: Vital signs in last 24 hours: Temp:  [97.9 F (36.6 C)-98.4 F (36.9 C)] 98.1 F (36.7 C) (03/12 1344) Pulse Rate:  [86-105] 86 (03/12 1344) Resp:  [18] 18 (03/12 1344) BP: (127-134)/(74-89) 131/74 mmHg (03/12 1344) SpO2:  [97 %-99 %] 99 % (03/12 1344)  Did not examine Lab Results  Recent Labs  05/09/13 0515 05/10/13 0515  WBC 6.1 8.0  HGB 9.0* 9.5*  HCT 27.9* 29.4*  NA 135* 132*  K 3.5* 3.2*  CL 101 97  CO2 23 22  BUN 4* 5*  CREATININE 0.57 0.53   Liver Panel  Recent Labs  05/09/13 0515  PROT 5.5*  ALBUMIN 1.7*  AST 18  ALT 16  ALKPHOS 589*  BILITOT 2.9*  BILIDIR 1.9*  IBILI 1.0*   Sedimentation Rate No results found for this basename: ESRSEDRATE,  in the last 72 hours C-Reactive Protein No results found for this basename: CRP,  in the last 72 hours  Microbiology: Recent Results (from the past 240 hour(s))  CULTURE, BLOOD (ROUTINE X 2)     Status: None   Collection Time    05/04/13  8:41 PM      Result  Value Ref Range Status   Specimen Description BLOOD LEFT ANTECUBITAL   Final   Special Requests BOTTLES DRAWN AEROBIC AND ANAEROBIC 8ML   Final   Culture  Setup Time     Final   Value: 05/05/2013 01:09     Performed at [facility]   Culture     Final   Value: SERRATIA MARCESCENS     Note: SUSCEPTIBILITIES PERFORMED ON PREVIOUS CULTURE WITHIN THE LAST 5 DAYS.     Note: Gram Stain Report Called to,Read Back By and Verified With: Binta HUFF 05/05/13 @ 6:28PM BY RUSCOE A.     Performed at [facility]   Report Status 05/07/2013 FINAL   Final  CULTURE, BLOOD (ROUTINE X 2)     Status: None   Collection Time    05/04/13  9:40 PM      Result Value Ref Range Status   Specimen Description BLOOD RIGHT PORTA CATH   Final   Special Requests BOTTLES DRAWN AEROBIC AND ANAEROBIC 5CC   Final   Culture  Setup Time     Final   Value: 05/05/2013 01:09     Performed at [facility]   Culture      Final   Value: SERRATIA MARCESCENS     Note: Gram Stain Report Called to,Read Back By and Verified With: [name] 05/05/13 @ 6:28PM BY RUSCOE A.     Performed at [facility]   Report Status 05/07/2013 FINAL   Final   Organism ID, Bacteria SERRATIA MARCESCENS   Final  CULTURE, BLOOD (ROUTINE X 2)     Status: None   Collection Time    05/07/13  4:25 PM      Result Value Ref Range Status   Specimen Description BLOOD RIGHT ARM   Final   Special Requests BOTTLES DRAWN AEROBIC AND ANAEROBIC 10CC EACH   Final   Culture  Setup Time     Final   Value: 05/07/2013 20:56     Performed at [facility]   Culture     Final   Value:        BLOOD CULTURE RECEIVED NO GROWTH TO DATE CULTURE WILL BE HELD FOR 5 DAYS BEFORE ISSUING A FINAL NEGATIVE REPORT     Performed at [facility]   Report Status PENDING   Incomplete  CULTURE, BLOOD (ROUTINE X 2)     Status: None   Collection Time    05/07/13  4:36 PM      Result Value Ref Range Status   Specimen Description BLOOD LEFT ARM   Final   Special Requests BOTTLES DRAWN AEROBIC AND ANAEROBIC 5CC EACH   Final   Culture  Setup Time     Final   Value: 05/08/2013 13:28     Performed at [facility]   Culture     Final   Value:        BLOOD CULTURE RECEIVED NO GROWTH TO DATE CULTURE WILL BE HELD FOR 5 DAYS BEFORE ISSUING A FINAL NEGATIVE REPORT     Performed at [facility]   Report Status PENDING   Incomplete  CLOSTRIDIUM DIFFICILE BY PCR     Status: None   Collection Time    05/08/13  5:12 AM      Result Value Ref Range Status   C difficile by pcr NEGATIVE  NEGATIVE Final   Comment: Performed at [facility]  Status: None   Collection Time    05/08/13  2:20 PM      Result Value Ref Range Status   Specimen Description LIVER BILE FROM BILIARY DRAINS LEFT   Final   Special Requests Normal   Final   Gram Stain     Final   Value: NO WBC SEEN     NO ORGANISMS SEEN     Performed at  [facility]   Culture     Final   Value: FEW ENTEROBACTER CLOACAE     Note: CRITICAL RESULT CALLED TO, READ BACK BY AND VERIFIED WITH: BESS B_0 :13AM ON 05/09/13 BY DANTS     FEW YEAST     Performed at [facility]   Report Status PENDING   Incomplete  BODY FLUID CULTURE     Status: None   Collection Time    05/08/13  2:40 PM      Result Value Ref Range Status   Specimen Description LIVER Bile from biliary drain RIGHT   Final   Special Requests NONE   Final   Gram Stain     Final   Value: NO WBC SEEN     NO ORGANISMS SEEN     Performed at [facility]   Culture     Final   Value: FEW ENTEROBACTER CLOACAE     Note: CRITICAL RESULT CALLED TO, READ BACK BY AND VERIFIED WITH: BESS B_1 :13AM ON 05/09/13 BY DANTS     FEW YEAST     Performed at [facility]   Report Status PENDING   Incomplete    Studies/Results: Ir Catheter Tube Change  05/09/2013   INDICATION History of cervical cancer with metastatic disease to the liver and now with malignant obstructive jaundice. Post left (04/09/2013) and right-sided (04/27/2013) percutaneous biliary drainage catheter, with improvement and serum bilirubin though there has been persistent drainage around the recently placed a right-sided biliary drainage catheter.  EXAM FLUOROSCOPIC GUIDED RIGHT SIDED BILIARY DRAINAGE CATHETER EXCHANGE AND UPSIZING  COMPARISON IR CATHETER TUBE CHANGE dated 04/27/2013; CT ABD/PELVIS W CM dated 05/05/2013; IR CHOLAN EXIST TUBE dated 04/23/2013; IR PTC dated 04/09/2013; CT ABD/PELVIS W CM dated 04/05/2013  CONTRAST 20 mL Isovue-300 administered into the biliary system.  FLUOROSCOPY TIME 3 minutes 30 seconds  COMPLICATIONS None immediate  TECHNIQUE Informed written consent was obtained from the patient after a discussion of the risks, benefits and alternatives to treatment. Questions regarding the procedure were encouraged and answered. A timeout was performed prior to the initiation of the procedure.  The  right upper abdominal quadrant and external portion of existing right-sided biliary drainage catheter were prepped and draped in the usual sterile fashion. A sterile drape was applied covering the operative field. Maximum barrier sterile technique with sterile gowns and gloves were used for the procedure. A timeout was performed prior to the initiation of the procedure.  A preprocedural spot fluoroscopic image was obtained. Multiple spot fluoroscopic and radiographic images were obtained after the injection of a small amount of contrast via the existing right-sided biliary drainage catheter.  The existing biliary drainage catheter was cut and cannulated with a stiff Glidewire which was coiled within the proximal aspect of the duodenum. . Under intermittent fluoroscopic guidance, the existing biliary drainage catheter was exchanged for a new, slightly larger 12 [place] all-purpose drainage catheter. Note, approximately 6 cm of additional sideholes were cut peripheral to the radiopaque marker.  A minimal amount of contrast was injected and a post exchange  radiograph was obtained. The catheter was locked and secured to the skin with a suture. A dressing was placed. The patient tolerated the procedure well without immediate postprocedural complication.  FINDINGS The existing right-sided biliary drainage catheter is appropriately positioned and functioning with passage of contrast through the distal end of the catheter and into the proximal duodenum. A very minimal amount of contrast was noted to reflux along the external portion of the biliary drainage catheter about the hepatic capsule.  Grossly unchanged positioning of existing left-sided biliary drainage catheter. Dedicated left-sided biliary drainage injection was not performed as the patient is not currently complaining drainage around the left-sided biliary drainage catheter.  After successful fluoroscopic exchange, a new, slightly larger, now 32 French  percutaneous biliary drainage catheter is positioned with end coiled and locked within the proximal duodenum. Note, approximately 6 cm of additional sideholes were cut peripheral to the radiopaque marker.  IMPRESSION Successful fluoroscopic guided exchange and up sizing of right sided, now 46 French percutaneous nephrostomy catheter with approximately 6 cm of additional sideholes cut peripheral to the radiopaque marker for persistent peri-catheter leakage.  PLAN Given progression of hilar predominant hepatic metastatic disease, this patient is not a candidate for internal biliary stent placement and thus routine biliary exchanges should be performed on a q 6 week basis.  SIGNATURE  Electronically Signed   By: Sandi Mariscal M.D.   On: 05/09/2013 12:03     Assessment/Plan: Serratia Bacteremia  Cervical Ca, stage IV   Liver metastases, biliary obstruction Loose BM (C diff -)  Total days of antibiotics: 7(cefepime day 4 & mica 1)  Hepatic abscess = prelim cx showing enterobacter plus yeast. We will start micafungin and Continue with cefepime  Serratia bacteremia = appears that port is also involved since initial cx had identified from port and peripheral blood cx. Repeat blood cx appear NGTD. Continue with cefepime for now. Will discuss need to remove port. Would recommend removal if we have recurrence of serratia bacteremia.          Velita Quirk Infectious Diseases (pager) [phone] [email or web address] 05/10/2013, 3:34 PM  LOS: 6 days

## 2013-05-10 NOTE — Progress Notes (Signed)
Subjective: Pt seen today. Reports some pain at right biliary drain site. Also noticed some bile staining on dressing. S/p upsize of right biliary drain to 12Fr  Objective: Physical Exam: BP 134/89  Pulse 100  Temp(Src) 97.9 F (36.6 C) (Oral)  Resp 18  Ht $R'5\' 1"'ux$  (1.549 m)  Wt 157 lb 12.8 oz (71.578 kg)  BMI 29.83 kg/m2  SpO2 98% Anterior (left) biliary drain site clean, dry. Dark bilious output with some debris. Lateral (right) biliary drain intact. Dressing does have some bile staining, but when dressing removed, skin site is dry. No active leaking seen. New dressing placed. Good thin bilious output, >270mL in bag this am. Last bili down to 2.9    Labs: CBC  Recent Labs  05/09/13 0515 05/10/13 0515  WBC 6.1 8.0  HGB 9.0* 9.5*  HCT 27.9* 29.4*  PLT 281 290   BMET  Recent Labs  05/09/13 0515 05/10/13 0515  NA 135* 132*  K 3.5* 3.2*  CL 101 97  CO2 23 22  GLUCOSE 108* 107*  BUN 4* 5*  CREATININE 0.57 0.53  CALCIUM 8.3* 8.3*   LFT  Recent Labs  05/09/13 0515  PROT 5.5*  ALBUMIN 1.7*  AST 18  ALT 16  ALKPHOS 589*  BILITOT 2.9*  BILIDIR 1.9*  IBILI 1.0*   PT/INR  Recent Labs  05/09/13 0515  LABPROT 23.3*  INR 2.15*     Studies/Results: Ir Catheter Tube Change  05/09/2013   INDICATION History of cervical cancer with metastatic disease to the liver and now with malignant obstructive jaundice. Post left (04/09/2013) and right-sided (04/27/2013) percutaneous biliary drainage catheter, with improvement and serum bilirubin though there has been persistent drainage around the recently placed a right-sided biliary drainage catheter.  EXAM FLUOROSCOPIC GUIDED RIGHT SIDED BILIARY DRAINAGE CATHETER EXCHANGE AND UPSIZING  COMPARISON IR CATHETER TUBE CHANGE dated 04/27/2013; CT ABD/PELVIS W CM dated 05/05/2013; IR CHOLAN EXIST TUBE dated 04/23/2013; IR PTC dated 04/09/2013; CT ABD/PELVIS W CM dated 04/05/2013  CONTRAST 20 mL Isovue-300 administered into the biliary  system.  FLUOROSCOPY TIME 3 minutes 30 seconds  COMPLICATIONS None immediate  TECHNIQUE Informed written consent was obtained from the patient after a discussion of the risks, benefits and alternatives to treatment. Questions regarding the procedure were encouraged and answered. A timeout was performed prior to the initiation of the procedure.  The right upper abdominal quadrant and external portion of existing right-sided biliary drainage catheter were prepped and draped in the usual sterile fashion. A sterile drape was applied covering the operative field. Maximum barrier sterile technique with sterile gowns and gloves were used for the procedure. A timeout was performed prior to the initiation of the procedure.  A preprocedural spot fluoroscopic image was obtained. Multiple spot fluoroscopic and radiographic images were obtained after the injection of a small amount of contrast via the existing right-sided biliary drainage catheter.  The existing biliary drainage catheter was cut and cannulated with a stiff Glidewire which was coiled within the proximal aspect of the duodenum. . Under intermittent fluoroscopic guidance, the existing biliary drainage catheter was exchanged for a new, slightly larger 12 [place] all-purpose drainage catheter. Note, approximately 6 cm of additional sideholes were cut peripheral to the radiopaque marker.  A minimal amount of contrast was injected and a post exchange radiograph was obtained. The catheter was locked and secured to the skin with a suture. A dressing was placed. The patient tolerated the procedure well without immediate postprocedural complication.  FINDINGS The existing right-sided  biliary drainage catheter is appropriately positioned and functioning with passage of contrast through the distal end of the catheter and into the proximal duodenum. A very minimal amount of contrast was noted to reflux along the external portion of the biliary drainage catheter about the  hepatic capsule.  Grossly unchanged positioning of existing left-sided biliary drainage catheter. Dedicated left-sided biliary drainage injection was not performed as the patient is not currently complaining drainage around the left-sided biliary drainage catheter.  After successful fluoroscopic exchange, a new, slightly larger, now 34 French percutaneous biliary drainage catheter is positioned with end coiled and locked within the proximal duodenum. Note, approximately 6 cm of additional sideholes were cut peripheral to the radiopaque marker.  IMPRESSION Successful fluoroscopic guided exchange and up sizing of right sided, now 57 French percutaneous nephrostomy catheter with approximately 6 cm of additional sideholes cut peripheral to the radiopaque marker for persistent peri-catheter leakage.  PLAN Given progression of hilar predominant hepatic metastatic disease, this patient is not a candidate for internal biliary stent placement and thus routine biliary exchanges should be performed on a q 6 week basis.  SIGNATURE  Electronically Signed   By: Sandi Mariscal M.D.   On: 05/09/2013 12:03    Assessment/Plan: s/p bilateral biliary drain placements 2/27 for obstructive jaundice secondary to metastatic cervical cancer. (R) biliary drain exchanged and upsized to 12Fr yesterday. Good output, no leak at current time, may have leaked some immediately post procedure. Will watch closely. Serratia bacteremia, ID following. Repeat Blood Cx negative so far, hopefully we can salvage her port.     LOS: 6 days    Ascencion Dike PA-C 05/10/2013 10:12 AM

## 2013-05-11 MED ORDER — POTASSIUM CHLORIDE CRYS ER 20 MEQ PO TBCR
40.0000 meq | EXTENDED_RELEASE_TABLET | Freq: Once | ORAL | Status: AC
  Administered 2013-05-11: 40 meq via ORAL
  Filled 2013-05-11: qty 2

## 2013-05-11 NOTE — Progress Notes (Signed)
INFECTIOUS DISEASE PROGRESS NOTE  ID: [name] is a 63 y.o. female with  Active Problems:   Atrial fibrillation   Cervical cancer   Anemia of chronic disease   Hyponatremia   Protein-calorie malnutrition, severe   Abdominal pain, unspecified site   Malignant obstructive jaundice   Ascending cholangitis   Cholangitis   Sepsis   History of DVT (deep vein thrombosis)   Gram-negative bacteremia   Hypophosphatemia   Hypokalemia  Subjective: Afebrile. Feeling better than yesterday. Still has some drainage outside of drain.  cx results from hepatic drain show enterobacter plus yeast  Abtx:  Anti-infectives   Start     Dose/Rate Route Frequency Ordered Stop   05/10/13 1800  micafungin (MYCAMINE) 100 mg in sodium chloride 0.9 % 100 mL IVPB     100 mg 100 mL/hr over 1 Hours Intravenous Every 24 hours 05/10/13 1533     05/07/13 1600  ceFEPIme (MAXIPIME) 1 g in dextrose 5 % 50 mL IVPB     1 g 100 mL/hr over 30 Minutes Intravenous Every 12 hours 05/07/13 1534     05/05/13 1000  vancomycin (VANCOCIN) IVPB 1000 mg/200 mL premix  Status:  Discontinued     1,000 mg 200 mL/hr over 60 Minutes Intravenous Every 12 hours 05/05/13 0344 05/07/13 1534   05/05/13 0000  vancomycin (VANCOCIN) IVPB 1000 mg/200 mL premix     1,000 mg 200 mL/hr over 60 Minutes Intravenous  Once 05/04/13 2355 05/05/13 0124   05/04/13 2345  piperacillin-tazobactam (ZOSYN) IVPB 3.375 g  Status:  Discontinued     3.375 g 12.5 mL/hr over 240 Minutes Intravenous 3 times per day 05/04/13 2336 05/07/13 1534      Medications:  Scheduled: . ceFEPime (MAXIPIME) IV  1 g Intravenous Q12H  . citalopram  20 mg Oral Daily  . diltiazem  180 mg Oral Daily  . docusate sodium  100 mg Oral BID  . fentaNYL  200 mcg Transdermal Q72H  . loratadine  10 mg Oral Daily  . metoprolol tartrate  25 mg Oral BID  . micafungin (MYCAMINE) IV  100 mg Intravenous Q24H  . pantoprazole  40 mg Oral Daily  . potassium chloride  10 mEq  Oral BID  . Rivaroxaban  20 mg Oral QPM  . sodium chloride  3 mL Intravenous Q12H    Objective: Vital signs in last 24 hours: Temp:  [89.1 F (31.7 C)-98.5 F (36.9 C)] 89.1 F (31.7 C) (03/13 1322) Pulse Rate:  [93-113] 93 (03/13 1322) Resp:  [17-19] 19 (03/13 1322) BP: (97-119)/(75-85) 119/85 mmHg (03/13 1322) SpO2:  [97 %-100 %] 100 % (03/13 1322)  Did not examine Lab Results  Recent Labs  05/09/13 0515 05/10/13 0515  WBC 6.1 8.0  HGB 9.0* 9.5*  HCT 27.9* 29.4*  NA 135* 132*  K 3.5* 3.2*  CL 101 97  CO2 23 22  BUN 4* 5*  CREATININE 0.57 0.53   Liver Panel  Recent Labs  05/09/13 0515  PROT 5.5*  ALBUMIN 1.7*  AST 18  ALT 16  ALKPHOS 589*  BILITOT 2.9*  BILIDIR 1.9*  IBILI 1.0*   Sedimentation Rate No results found for this basename: ESRSEDRATE,  in the last 72 hours C-Reactive Protein No results found for this basename: CRP,  in the last 72 hours  Microbiology: Recent Results (from the past 240 hour(s))  CULTURE, BLOOD (ROUTINE X 2)     Status: None   Collection Time    05/04/13  8:41 PM      Result Value Ref Range Status   Specimen Description BLOOD LEFT ANTECUBITAL   Final   Special Requests BOTTLES DRAWN AEROBIC AND ANAEROBIC 8ML   Final   Culture  Setup Time     Final   Value: 05/05/2013 01:09     Performed at [facility]   Culture     Final   Value: SERRATIA MARCESCENS     Note: SUSCEPTIBILITIES PERFORMED ON PREVIOUS CULTURE WITHIN THE LAST 5 DAYS.     Note: Gram Stain Report Called to,Read Back By and Verified With: Willia HUFF 05/05/13 @ 6:28PM BY RUSCOE A.     Performed at [facility]   Report Status 05/07/2013 FINAL   Final  CULTURE, BLOOD (ROUTINE X 2)     Status: None   Collection Time    05/04/13  9:40 PM      Result Value Ref Range Status   Specimen Description BLOOD RIGHT PORTA CATH   Final   Special Requests BOTTLES DRAWN AEROBIC AND ANAEROBIC 5CC   Final   Culture  Setup Time     Final   Value: 05/05/2013  01:09     Performed at [facility]   Culture     Final   Value: SERRATIA MARCESCENS     Note: Gram Stain Report Called to,Read Back By and Verified With: [name] 05/05/13 @ 6:28PM BY RUSCOE A.     Performed at [facility]   Report Status 05/07/2013 FINAL   Final   Organism ID, Bacteria SERRATIA MARCESCENS   Final  CULTURE, BLOOD (ROUTINE X 2)     Status: None   Collection Time    05/07/13  4:25 PM      Result Value Ref Range Status   Specimen Description BLOOD RIGHT ARM   Final   Special Requests BOTTLES DRAWN AEROBIC AND ANAEROBIC 10CC EACH   Final   Culture  Setup Time     Final   Value: 05/07/2013 20:56     Performed at [facility]   Culture     Final   Value:        BLOOD CULTURE RECEIVED NO GROWTH TO DATE CULTURE WILL BE HELD FOR 5 DAYS BEFORE ISSUING A FINAL NEGATIVE REPORT     Performed at [facility]   Report Status PENDING   Incomplete  CULTURE, BLOOD (ROUTINE X 2)     Status: None   Collection Time    05/07/13  4:36 PM      Result Value Ref Range Status   Specimen Description BLOOD LEFT ARM   Final   Special Requests BOTTLES DRAWN AEROBIC AND ANAEROBIC 5CC EACH   Final   Culture  Setup Time     Final   Value: 05/08/2013 13:28     Performed at [facility]   Culture     Final   Value:        BLOOD CULTURE RECEIVED NO GROWTH TO DATE CULTURE WILL BE HELD FOR 5 DAYS BEFORE ISSUING A FINAL NEGATIVE REPORT     Performed at [facility]   Report Status PENDING   Incomplete  CLOSTRIDIUM DIFFICILE BY PCR     Status: None   Collection Time    05/08/13  5:12 AM      Result Value Ref Range Status   C difficile by pcr NEGATIVE  NEGATIVE Final   Comment: Performed at [facility]  BODY FLUID CULTURE     Status: None   Collection Time    05/08/13  2:20 PM      Result Value Ref Range Status   Specimen Description LIVER BILE FROM BILIARY DRAINS LEFT   Final   Special Requests Normal   Final   Gram Stain     Final    Value: NO WBC SEEN     NO ORGANISMS SEEN     Performed at [facility]   Culture     Final   Value: FEW ENTEROBACTER CLOACAE     Note: CRITICAL RESULT CALLED TO, READ BACK BY AND VERIFIED WITH: BESS B@10 :13AM ON 05/09/13 BY DANTS     FEW YEAST     Performed at [facility]   Report Status PENDING   Incomplete  BODY FLUID CULTURE     Status: None   Collection Time    05/08/13  2:40 PM      Result Value Ref Range Status   Specimen Description LIVER Bile from biliary drain RIGHT   Final   Special Requests NONE   Final   Gram Stain     Final   Value: NO WBC SEEN     NO ORGANISMS SEEN     Performed at [facility]   Culture     Final   Value: FEW ENTEROBACTER CLOACAE     Note: CRITICAL RESULT CALLED TO, READ BACK BY AND VERIFIED WITH: BESS B@10 :13AM ON 05/09/13 BY DANTS     FEW YEAST     Performed at [facility]   Report Status PENDING   Incomplete    Studies/Results: No results found.   Assessment/Plan: Serratia Bacteremia  Cervical Ca, stage IV   Liver metastases, biliary obstruction Loose BM (C diff -)  Total days of antibiotics: 8(cefepime day 5 & mica 2)  Hepatic abscess = cx showing enterobacter plus yeast. Sensitivities still pending. Continue on  micafungin and cefepime  Serratia bacteremia = appears that port is also involved since initial cx had identified from port and peripheral blood cx. Repeat blood cx appear NGTD. Continue with cefepime. The most conservative measure is to recommend port removal so that it does not pose as a further nidus of infection in the future. She states that she does not know if she is to undergo any furhter chemo therapy.           Ernisha Sorn Infectious Diseases (pager) [phone] [email or web address] 05/11/2013, 5:19 PM  LOS: 7 days

## 2013-05-11 NOTE — Progress Notes (Signed)
Subjective: Pt seen today. Reports some pain at right biliary drain site. Also noticed some bile staining on dressing. S/p upsize of right biliary drain to 12Fr  Objective: Physical Exam: BP 97/78  Pulse 113  Temp(Src) 97.5 F (36.4 C) (Oral)  Resp 17  Ht $R'5\' 1"'bk$  (1.549 m)  Wt 157 lb 12.8 oz (71.578 kg)  BMI 29.83 kg/m2  SpO2 97% Anterior (left) biliary drain site clean, dry. Dark bilious output with some debris. 161mL recorded Lateral (right) biliary drain intact. Dressing with some bile staining, small amount of leakage from skin site. Good thin bilious output, >957mL recorded yesterday Last bili down to 2.9    Labs: CBC  Recent Labs  05/09/13 0515 05/10/13 0515  WBC 6.1 8.0  HGB 9.0* 9.5*  HCT 27.9* 29.4*  PLT 281 290   BMET  Recent Labs  05/09/13 0515 05/10/13 0515  NA 135* 132*  K 3.5* 3.2*  CL 101 97  CO2 23 22  GLUCOSE 108* 107*  BUN 4* 5*  CREATININE 0.57 0.53  CALCIUM 8.3* 8.3*   LFT  Recent Labs  05/09/13 0515  PROT 5.5*  ALBUMIN 1.7*  AST 18  ALT 16  ALKPHOS 589*  BILITOT 2.9*  BILIDIR 1.9*  IBILI 1.0*   PT/INR  Recent Labs  05/09/13 0515  LABPROT 23.3*  INR 2.15*     Studies/Results: Ir Catheter Tube Change  05/09/2013   INDICATION History of cervical cancer with metastatic disease to the liver and now with malignant obstructive jaundice. Post left (04/09/2013) and right-sided (04/27/2013) percutaneous biliary drainage catheter, with improvement and serum bilirubin though there has been persistent drainage around the recently placed a right-sided biliary drainage catheter.  EXAM FLUOROSCOPIC GUIDED RIGHT SIDED BILIARY DRAINAGE CATHETER EXCHANGE AND UPSIZING  COMPARISON IR CATHETER TUBE CHANGE dated 04/27/2013; CT ABD/PELVIS W CM dated 05/05/2013; IR CHOLAN EXIST TUBE dated 04/23/2013; IR PTC dated 04/09/2013; CT ABD/PELVIS W CM dated 04/05/2013  CONTRAST 20 mL Isovue-300 administered into the biliary system.  FLUOROSCOPY TIME 3 minutes 30  seconds  COMPLICATIONS None immediate  TECHNIQUE Informed written consent was obtained from the patient after a discussion of the risks, benefits and alternatives to treatment. Questions regarding the procedure were encouraged and answered. A timeout was performed prior to the initiation of the procedure.  The right upper abdominal quadrant and external portion of existing right-sided biliary drainage catheter were prepped and draped in the usual sterile fashion. A sterile drape was applied covering the operative field. Maximum barrier sterile technique with sterile gowns and gloves were used for the procedure. A timeout was performed prior to the initiation of the procedure.  A preprocedural spot fluoroscopic image was obtained. Multiple spot fluoroscopic and radiographic images were obtained after the injection of a small amount of contrast via the existing right-sided biliary drainage catheter.  The existing biliary drainage catheter was cut and cannulated with a stiff Glidewire which was coiled within the proximal aspect of the duodenum. . Under intermittent fluoroscopic guidance, the existing biliary drainage catheter was exchanged for a new, slightly larger 12 [place] all-purpose drainage catheter. Note, approximately 6 cm of additional sideholes were cut peripheral to the radiopaque marker.  A minimal amount of contrast was injected and a post exchange radiograph was obtained. The catheter was locked and secured to the skin with a suture. A dressing was placed. The patient tolerated the procedure well without immediate postprocedural complication.  FINDINGS The existing right-sided biliary drainage catheter is appropriately positioned and functioning with  passage of contrast through the distal end of the catheter and into the proximal duodenum. A very minimal amount of contrast was noted to reflux along the external portion of the biliary drainage catheter about the hepatic capsule.  Grossly unchanged  positioning of existing left-sided biliary drainage catheter. Dedicated left-sided biliary drainage injection was not performed as the patient is not currently complaining drainage around the left-sided biliary drainage catheter.  After successful fluoroscopic exchange, a new, slightly larger, now 32 French percutaneous biliary drainage catheter is positioned with end coiled and locked within the proximal duodenum. Note, approximately 6 cm of additional sideholes were cut peripheral to the radiopaque marker.  IMPRESSION Successful fluoroscopic guided exchange and up sizing of right sided, now 30 French percutaneous nephrostomy catheter with approximately 6 cm of additional sideholes cut peripheral to the radiopaque marker for persistent peri-catheter leakage.  PLAN Given progression of hilar predominant hepatic metastatic disease, this patient is not a candidate for internal biliary stent placement and thus routine biliary exchanges should be performed on a q 6 week basis.  SIGNATURE  Electronically Signed   By: Sandi Mariscal M.D.   On: 05/09/2013 12:03    Assessment/Plan: s/p bilateral biliary drain placements 2/27 for obstructive jaundice secondary to metastatic cervical cancer. (R) biliary drain exchanged and upsized to 12Fr yesterday. Good output, small amount of leakage from skin site despite upsize. Will have to be diligent about keeping clean dressing in place to prevent skin irritation.  Serratia bacteremia, ID following. Repeat Blood Cx negative so far, hopefully we can salvage her port.     LOS: 7 days    Ascencion Dike PA-C 05/11/2013 10:48 AM

## 2013-05-11 NOTE — Progress Notes (Signed)
[facility] PROGRESS NOTE  [name] WRU:[id number] DOB: 1950/08/16 DOA: 05/04/2013 PCP: Gwendolyn Grant, MD  Brief narrative:  Patient is a 63 year old with history of cervical cancer diagnosed in 2005 status post total hysterectomy with recurrence in 2010 status post chemotherapy and radiation treatment found to have metastatic spread to the liver since 2012. The patient subsequently developed biliary obstruction of which stenting was performed by interventional radiology. Patient presents for this admission with sepsis and gram-negative bacteremia growing Serratia marcescens.   Assessment/Plan:  Sepsis/ gram negative bacteremia - Will continue broad spectrum antibiotics at this juncture - Pt growing: SERRATIA MARCESCENS -Repeat BC (3/9) pending. If negative, we may be able to salvage her port. Altho ID considering removal regardless. -Await ID input re: choice and length of antibiotic treatment (depends on cx). -Sepsis parameters resolved. -Cx from biliary drain with enterobacter and yeast. Await sensitivities.    Atrial fibrillation - At this point we'll continue metoprolol and diltiazem. - Currently patient is on xarelto    Cervical cancer - Pt to follow up with oncologist on discharge    Anemia of chronic disease - Hemoglobin currently stable    Hyponatremia - Most likely due to poor oral solute intake. Currently improving and suspect continued improvement with improvement in oral intake.     Protein-calorie malnutrition, severe - Currently on regular diet    History of DVT (deep vein thrombosis) - Patient is currently on xarelto  Code Status: full Family Communication: No family at bedside Disposition Plan: To be determined   Consultants:  IR  ID  Procedures:  None  Antibiotics:  Patient is on vancomycin and Zosyn  HPI/Subjective: Still with some pain at drain site.  Objective: Filed Vitals:   05/11/13 1322  BP: 119/85  Pulse: 93  Temp:  89.1 F (31.7 C)  Resp: 19    Intake/Output Summary (Last 24 hours) at 05/11/13 1735 Last data filed at 05/11/13 1700  Gross per 24 hour  Intake   7530 ml  Output   2300 ml  Net   5230 ml   Filed Weights   05/04/13 1931 05/04/13 2342  Weight: 72.576 kg (160 lb) 71.578 kg (157 lb 12.8 oz)    Exam:   General:  alert and awake  Cardiovascular: RRR, no MRG  Respiratory: CTA BL, no wheezes  Abdomen: increased pain today, nondistended, hypoactive bowel sounds   Musculoskeletal: no cyanosis or clubbing   Data Reviewed: Basic Metabolic Panel:  Recent Labs Lab 05/04/13 2041 05/05/13 0355 05/07/13 0430 05/08/13 0350 05/09/13 0515 05/10/13 0515  NA 129* 130* 135*  --  135* 132*  K 3.6* 3.2* 3.3*  --  3.5* 3.2*  CL 96 97 102  --  101 97  CO2 20 22 22   --  23 22  GLUCOSE 111* 88 92  --  108* 107*  BUN 12 10 5*  --  4* 5*  CREATININE 0.71 0.58 0.59  --  0.57 0.53  CALCIUM 8.4 7.9* 7.7*  --  8.3* 8.3*  MG  --  1.6  --   --   --   --   PHOS  --  2.0*  --  2.1* 2.5  --    Liver Function Tests:  Recent Labs Lab 05/04/13 2041 05/05/13 0355 05/09/13 0515  AST 25 21 18   ALT 27 24 16   ALKPHOS 601* 484* 589*  BILITOT 4.5* 4.0* 2.9*  PROT 6.4 5.3* 5.5*  ALBUMIN 1.9* 1.7* 1.7*    Recent Labs  Lab 05/04/13 2041  LIPASE 9*   No results found for this basename: AMMONIA,  in the last 168 hours CBC:  Recent Labs Lab 05/04/13 2041 05/05/13 0355 05/07/13 0430 05/09/13 0515 05/10/13 0515  WBC 8.5 6.2 5.3 6.1 8.0  NEUTROABS 6.9  --   --   --   --   HGB 9.5* 8.4* 8.3* 9.0* 9.5*  HCT 28.1* 25.7* 24.8* 27.9* 29.4*  MCV 90.9 91.8 91.9 90.6 90.5  PLT 280 237 252 281 290   Cardiac Enzymes: No results found for this basename: CKTOTAL, CKMB, CKMBINDEX, TROPONINI,  in the last 168 hours BNP (last 3 results) No results found for this basename: PROBNP,  in the last 8760 hours CBG: No results found for this basename: GLUCAP,  in the last 168 hours  Recent Results  (from the past 240 hour(s))  CULTURE, BLOOD (ROUTINE X 2)     Status: None   Collection Time    05/04/13  8:41 PM      Result Value Ref Range Status   Specimen Description BLOOD LEFT ANTECUBITAL   Final   Special Requests BOTTLES DRAWN AEROBIC AND ANAEROBIC 8ML   Final   Culture  Setup Time     Final   Value: 05/05/2013 01:09     Performed at [facility]   Culture     Final   Value: SERRATIA MARCESCENS     Note: SUSCEPTIBILITIES PERFORMED ON PREVIOUS CULTURE WITHIN THE LAST 5 DAYS.     Note: Gram Stain Report Called to,Read Back By and Verified With: Leanore HUFF 05/05/13 @ 6:28PM BY RUSCOE A.     Performed at [facility]   Report Status 05/07/2013 FINAL   Final  CULTURE, BLOOD (ROUTINE X 2)     Status: None   Collection Time    05/04/13  9:40 PM      Result Value Ref Range Status   Specimen Description BLOOD RIGHT PORTA CATH   Final   Special Requests BOTTLES DRAWN AEROBIC AND ANAEROBIC 5CC   Final   Culture  Setup Time     Final   Value: 05/05/2013 01:09     Performed at [facility]   Culture     Final   Value: SERRATIA MARCESCENS     Note: Gram Stain Report Called to,Read Back By and Verified With: [name] 05/05/13 @ 6:28PM BY RUSCOE A.     Performed at [facility]   Report Status 05/07/2013 FINAL   Final   Organism ID, Bacteria SERRATIA MARCESCENS   Final  CULTURE, BLOOD (ROUTINE X 2)     Status: None   Collection Time    05/07/13  4:25 PM      Result Value Ref Range Status   Specimen Description BLOOD RIGHT ARM   Final   Special Requests BOTTLES DRAWN AEROBIC AND ANAEROBIC 10CC EACH   Final   Culture  Setup Time     Final   Value: 05/07/2013 20:56     Performed at [facility]   Culture     Final   Value:        BLOOD CULTURE RECEIVED NO GROWTH TO DATE CULTURE WILL BE HELD FOR 5 DAYS BEFORE ISSUING A FINAL NEGATIVE REPORT     Performed at [facility]   Report Status PENDING   Incomplete  CULTURE, BLOOD (ROUTINE X 2)      Status: None   Collection Time    05/07/13  4:36 PM      Result Value Ref Range Status   Specimen Description BLOOD LEFT ARM   Final   Special Requests BOTTLES DRAWN AEROBIC AND ANAEROBIC [facility] EACH   Final   Culture  Setup Time     Final   Value: 05/08/2013 13:28     Performed at [facility]   Culture     Final   Value:        BLOOD CULTURE RECEIVED NO GROWTH TO DATE CULTURE WILL BE HELD FOR 5 DAYS BEFORE ISSUING A FINAL NEGATIVE REPORT     Performed at [facility]   Report Status PENDING   Incomplete  CLOSTRIDIUM DIFFICILE BY PCR     Status: None   Collection Time    05/08/13  5:12 AM      Result Value Ref Range Status   C difficile by pcr NEGATIVE  NEGATIVE Final   Comment: Performed at [facility]  BODY FLUID CULTURE     Status: None   Collection Time    05/08/13  2:20 PM      Result Value Ref Range Status   Specimen Description LIVER BILE FROM BILIARY DRAINS LEFT   Final   Special Requests Normal   Final   Gram Stain     Final   Value: NO WBC SEEN     NO ORGANISMS SEEN     Performed at [facility]   Culture     Final   Value: FEW ENTEROBACTER CLOACAE     Note: CRITICAL RESULT CALLED TO, READ BACK BY AND VERIFIED WITH: BESS B@10 :13AM ON 05/09/13 BY DANTS     FEW YEAST     Performed at [facility]   Report Status PENDING   Incomplete  BODY FLUID CULTURE     Status: None   Collection Time    05/08/13  2:40 PM      Result Value Ref Range Status   Specimen Description LIVER Bile from biliary drain RIGHT   Final   Special Requests NONE   Final   Gram Stain     Final   Value: NO WBC SEEN     NO ORGANISMS SEEN     Performed at [facility]   Culture     Final   Value: FEW ENTEROBACTER CLOACAE     Note: CRITICAL RESULT CALLED TO, READ BACK BY AND VERIFIED WITH: BESS B@10 :13AM ON 05/09/13 BY DANTS     FEW YEAST     Performed at [facility]   Report Status PENDING   Incomplete     Studies: No results  found.  Scheduled Meds: . ceFEPime (MAXIPIME) IV  1 g Intravenous Q12H  . citalopram  20 mg Oral Daily  . diltiazem  180 mg Oral Daily  . docusate sodium  100 mg Oral BID  . fentaNYL  200 mcg Transdermal Q72H  . loratadine  10 mg Oral Daily  . metoprolol tartrate  25 mg Oral BID  . micafungin (MYCAMINE) IV  100 mg Intravenous Q24H  . pantoprazole  40 mg Oral Daily  . potassium chloride  10 mEq Oral BID  . Rivaroxaban  20 mg Oral QPM  . sodium chloride  3 mL Intravenous Q12H   Continuous Infusions: . sodium chloride 100 mL/hr at 05/11/13 1359     Time spent: 25 minutes    [facility] Pager [phone]  If 7PM-7AM, please contact night-coverage at  [email or web address], password [facility] 05/11/2013, 5:35 PM  LOS: 7 days

## 2013-05-11 NOTE — Progress Notes (Signed)
Biliary drain flushed with NS. Dressing around biliary drain changed. Pt. tolerated these activities well.  Will continue to monitor the patient.

## 2013-05-12 LAB — BASIC METABOLIC PANEL
BUN: 6 mg/dL (ref 6–23)
CHLORIDE: 101 meq/L (ref 96–112)
CO2: 25 meq/L (ref 19–32)
CREATININE: 0.55 mg/dL (ref 0.50–1.10)
Calcium: 8.3 mg/dL — ABNORMAL LOW (ref 8.4–10.5)
GFR calc non Af Amer: >90 mL/min (ref >90)
Glucose, Bld: 121 mg/dL — ABNORMAL HIGH (ref 70–99)
POTASSIUM: 3.4 meq/L — AB (ref 3.7–5.3)
Sodium: 134 mEq/L — ABNORMAL LOW (ref 137–147)

## 2013-05-12 LAB — CBC
HEMATOCRIT: 28.8 % — AB (ref 36.0–46.0)
HEMOGLOBIN: 9.2 g/dL — AB (ref 12.0–15.0)
MCH: 29 pg (ref 26.0–34.0)
MCHC: 31.9 g/dL (ref 30.0–36.0)
MCV: 90.9 fL (ref 78.0–100.0)
Platelets: 304 10*3/uL (ref 150–400)
RBC: 3.17 MIL/uL — ABNORMAL LOW (ref 3.87–5.11)
RDW: 14.3 % (ref 11.5–15.5)
WBC: 7.3 10*3/uL (ref 4.0–10.5)

## 2013-05-12 NOTE — Progress Notes (Signed)
[facility] PROGRESS NOTE  [name] OAC:[id number] DOB: 1951-02-05 DOA: 05/04/2013 PCP: Gwendolyn Grant, MD  Brief narrative:  Patient is a 63 year old with history of cervical cancer diagnosed in 2005 status post total hysterectomy with recurrence in 2010 status post chemotherapy and radiation treatment found to have metastatic spread to the liver since 2012. The patient subsequently developed biliary obstruction of which stenting was performed by interventional radiology. Patient presents for this admission with sepsis and gram-negative bacteremia growing Serratia marcescens.   Assessment/Plan:  Sepsis/ gram negative bacteremia - Will continue broad spectrum antibiotics at this juncture - Pt growing: SERRATIA MARCESCENS -Repeat BC (3/9) pending. If negative, we may be able to salvage her port. Altho ID considering removal regardless. -AppreciateID input re: choice and length of antibiotic treatment (depends on cx). -Sepsis parameters resolved. -Cx from biliary drain with enterobacter and yeast. Await sensitivities of yeast.    Atrial fibrillation - At this point we'll continue metoprolol and diltiazem. - Currently patient is on xarelto    Cervical cancer - Pt to follow up with oncologist on discharge    Anemia of chronic disease - Hemoglobin currently stable    Hyponatremia - Most likely due to poor oral solute intake. Currently improving and suspect continued improvement with improvement in oral intake.     Protein-calorie malnutrition, severe - Currently on regular diet    History of DVT (deep vein thrombosis) - Patient is currently on xarelto  Code Status: full Family Communication: Husband at bedside updated on plan of care. Disposition Plan: Home likely 48 hours.   Consultants:  IR  ID  Procedures:  None  Antibiotics:  Patient is on cefepime/micafungin  HPI/Subjective: Still with some pain at drain site. In good spirits  today.  Objective: Filed Vitals:   05/12/13 1439  BP: 124/85  Pulse: 106  Temp: 98.5 F (36.9 C)  Resp: 20    Intake/Output Summary (Last 24 hours) at 05/12/13 1500 Last data filed at 05/12/13 1440  Gross per 24 hour  Intake 3896.67 ml  Output   3095 ml  Net 801.67 ml   Filed Weights   05/04/13 1931 05/04/13 2342  Weight: 72.576 kg (160 lb) 71.578 kg (157 lb 12.8 oz)    Exam:   General:  alert and awake  Cardiovascular: RRR, no MRG  Respiratory: CTA BL, no wheezes  Abdomen: increased pain today, nondistended, hypoactive bowel sounds   Musculoskeletal: no cyanosis or clubbing   Data Reviewed: Basic Metabolic Panel:  Recent Labs Lab 05/07/13 0430 05/08/13 0350 05/09/13 0515 05/10/13 0515 05/12/13 0445  NA 135*  --  135* 132* 134*  K 3.3*  --  3.5* 3.2* 3.4*  CL 102  --  101 97 101  CO2 22  --  23 22 25   GLUCOSE 92  --  108* 107* 121*  BUN 5*  --  4* 5* 6  CREATININE 0.59  --  0.57 0.53 0.55  CALCIUM 7.7*  --  8.3* 8.3* 8.3*  PHOS  --  2.1* 2.5  --   --    Liver Function Tests:  Recent Labs Lab 05/09/13 0515  AST 18  ALT 16  ALKPHOS 589*  BILITOT 2.9*  PROT 5.5*  ALBUMIN 1.7*   No results found for this basename: LIPASE, AMYLASE,  in the last 168 hours No results found for this basename: AMMONIA,  in the last 168 hours CBC:  Recent Labs Lab 05/07/13 0430 05/09/13 0515 05/10/13 0515 05/12/13 0445  WBC 5.3 6.1  8.0 7.3  HGB 8.3* 9.0* 9.5* 9.2*  HCT 24.8* 27.9* 29.4* 28.8*  MCV 91.9 90.6 90.5 90.9  PLT 252 281 290 304   Cardiac Enzymes: No results found for this basename: CKTOTAL, CKMB, CKMBINDEX, TROPONINI,  in the last 168 hours BNP (last 3 results) No results found for this basename: PROBNP,  in the last 8760 hours CBG: No results found for this basename: GLUCAP,  in the last 168 hours  Recent Results (from the past 240 hour(s))  CULTURE, BLOOD (ROUTINE X 2)     Status: None   Collection Time    05/04/13  8:41 PM      Result  Value Ref Range Status   Specimen Description BLOOD LEFT ANTECUBITAL   Final   Special Requests BOTTLES DRAWN AEROBIC AND ANAEROBIC 8ML   Final   Culture  Setup Time     Final   Value: 05/05/2013 01:09     Performed at [facility]   Culture     Final   Value: SERRATIA MARCESCENS     Note: SUSCEPTIBILITIES PERFORMED ON PREVIOUS CULTURE WITHIN THE LAST 5 DAYS.     Note: Gram Stain Report Called to,Read Back By and Verified With: Wynonna HUFF 05/05/13 @ 6:28PM BY RUSCOE A.     Performed at [facility]   Report Status 05/07/2013 FINAL   Final  CULTURE, BLOOD (ROUTINE X 2)     Status: None   Collection Time    05/04/13  9:40 PM      Result Value Ref Range Status   Specimen Description BLOOD RIGHT PORTA CATH   Final   Special Requests BOTTLES DRAWN AEROBIC AND ANAEROBIC 5CC   Final   Culture  Setup Time     Final   Value: 05/05/2013 01:09     Performed at [facility]   Culture     Final   Value: SERRATIA MARCESCENS     Note: Gram Stain Report Called to,Read Back By and Verified With: [name] 05/05/13 @ 6:28PM BY RUSCOE A.     Performed at [facility]   Report Status 05/07/2013 FINAL   Final   Organism ID, Bacteria SERRATIA MARCESCENS   Final  CULTURE, BLOOD (ROUTINE X 2)     Status: None   Collection Time    05/07/13  4:25 PM      Result Value Ref Range Status   Specimen Description BLOOD RIGHT ARM   Final   Special Requests BOTTLES DRAWN AEROBIC AND ANAEROBIC 10CC EACH   Final   Culture  Setup Time     Final   Value: 05/07/2013 20:56     Performed at [facility]   Culture     Final   Value:        BLOOD CULTURE RECEIVED NO GROWTH TO DATE CULTURE WILL BE HELD FOR 5 DAYS BEFORE ISSUING A FINAL NEGATIVE REPORT     Performed at [facility]   Report Status PENDING   Incomplete  CULTURE, BLOOD (ROUTINE X 2)     Status: None   Collection Time    05/07/13  4:36 PM      Result Value Ref Range Status   Specimen Description BLOOD LEFT ARM    Final   Special Requests BOTTLES DRAWN AEROBIC AND ANAEROBIC [facility] EACH   Final   Culture  Setup Time     Final   Value: 05/08/2013 13:28     Performed at [facility]   Culture     Final   Value:        BLOOD CULTURE RECEIVED NO GROWTH TO DATE CULTURE WILL BE HELD FOR 5 DAYS BEFORE ISSUING A FINAL NEGATIVE REPORT     Performed at [facility]   Report Status PENDING   Incomplete  CLOSTRIDIUM DIFFICILE BY PCR     Status: None   Collection Time    05/08/13  5:12 AM      Result Value Ref Range Status   C difficile by pcr NEGATIVE  NEGATIVE Final   Comment: Performed at [facility]  BODY FLUID CULTURE     Status: None   Collection Time    05/08/13  2:20 PM      Result Value Ref Range Status   Specimen Description LIVER BILE FROM BILIARY DRAINS LEFT   Final   Special Requests Normal   Final   Gram Stain     Final   Value: NO WBC SEEN     NO ORGANISMS SEEN     Performed at [facility]   Culture     Final   Value: FEW ENTEROBACTER CLOACAE     Note: CRITICAL RESULT CALLED TO, READ BACK BY AND VERIFIED WITH: BESS B@10 :13AM ON 05/09/13 BY DANTS     FEW YEAST     Performed at [facility]   Report Status PENDING   Incomplete   Organism ID, Bacteria ENTEROBACTER CLOACAE   Final  BODY FLUID CULTURE     Status: None   Collection Time    05/08/13  2:40 PM      Result Value Ref Range Status   Specimen Description LIVER Bile from biliary drain RIGHT   Final   Special Requests NONE   Final   Gram Stain     Final   Value: NO WBC SEEN     NO ORGANISMS SEEN     Performed at [facility]   Culture     Final   Value: FEW ENTEROBACTER CLOACAE     Note: CRITICAL RESULT CALLED TO, READ BACK BY AND VERIFIED WITH: BESS B@10 :13AM ON 05/09/13 BY DANTS     FEW YEAST     Performed at [facility]   Report Status PENDING   Incomplete     Studies: No results found.  Scheduled Meds: . ceFEPime (MAXIPIME) IV  1 g Intravenous Q12H  . citalopram   20 mg Oral Daily  . diltiazem  180 mg Oral Daily  . docusate sodium  100 mg Oral BID  . fentaNYL  200 mcg Transdermal Q72H  . loratadine  10 mg Oral Daily  . metoprolol tartrate  25 mg Oral BID  . micafungin (MYCAMINE) IV  100 mg Intravenous Q24H  . pantoprazole  40 mg Oral Daily  . potassium chloride  10 mEq Oral BID  . Rivaroxaban  20 mg Oral QPM  . sodium chloride  3 mL Intravenous Q12H   Continuous Infusions: . sodium chloride 100 mL/hr at 05/12/13 0954     Time spent: 25 minutes    [facility] Pager [phone]  If 7PM-7AM, please contact night-coverage at [email or web address], password [facility] 05/12/2013, 3:00 PM  LOS: 8 days

## 2013-05-12 NOTE — Progress Notes (Signed)
  Subjective: Met cervical ca--biliary obstruction L Bili drain exchanged 2/27 Rt bili drain placed 2/27; upsized 3/11 to 12Fr Pt feels ok today; moderate abd pain Nothing new realy  Objective: Vital signs in last 24 hours: Temp:  [89.1 F (31.7 C)-98 F (36.7 C)] 97.8 F (36.6 C) (03/14 0428) Pulse Rate:  [93-101] 96 (03/14 0428) Resp:  [18-19] 18 (03/14 0428) BP: (119-122)/(70-85) 119/85 mmHg (03/14 0428) SpO2:  [98 %-100 %] 98 % (03/14 0428) Last BM Date: 05/09/13  Intake/Output from previous day: 03/13 0701 - 03/14 0700 In: 3256.7 [P.O.:600; I.V.:2456.7; IV Piggyback:200] Out: 63 [Urine:2800; Drains:720] Intake/Output this shift:    PE:  Afeb; VSS Pleasant L output 95 cc yesterday; 20 cc in bag: bilious R output 625 cc yesterday; 20 cc in bag: bilious Sites are sl tender; no bleeding Lt clean and dry Rt still with some oozing of bile around cath New dressing in place  Lab Results:   Recent Labs  05/10/13 0515 05/12/13 0445  WBC 8.0 7.3  HGB 9.5* 9.2*  HCT 29.4* 28.8*  PLT 290 304   BMET  Recent Labs  05/10/13 0515 05/12/13 0445  NA 132* 134*  K 3.2* 3.4*  CL 97 101  CO2 22 25  GLUCOSE 107* 121*  BUN 5* 6  CREATININE 0.53 0.55  CALCIUM 8.3* 8.3*   PT/INR No results found for this basename: LABPROT, INR,  in the last 72 hours ABG No results found for this basename: PHART, PCO2, PO2, HCO3,  in the last 72 hours  Studies/Results: No results found.  Anti-infectives: Anti-infectives   Start     Dose/Rate Route Frequency Ordered Stop   05/10/13 1800  micafungin (MYCAMINE) 100 mg in sodium chloride 0.9 % 100 mL IVPB     100 mg 100 mL/hr over 1 Hours Intravenous Every 24 hours 05/10/13 1533     05/07/13 1600  ceFEPIme (MAXIPIME) 1 g in dextrose 5 % 50 mL IVPB     1 g 100 mL/hr over 30 Minutes Intravenous Every 12 hours 05/07/13 1534     05/05/13 1000  vancomycin (VANCOCIN) IVPB 1000 mg/200 mL premix  Status:  Discontinued     1,000 mg 200  mL/hr over 60 Minutes Intravenous Every 12 hours 05/05/13 0344 05/07/13 1534   05/05/13 0000  vancomycin (VANCOCIN) IVPB 1000 mg/200 mL premix     1,000 mg 200 mL/hr over 60 Minutes Intravenous  Once 05/04/13 2355 05/05/13 0124   05/04/13 2345  piperacillin-tazobactam (ZOSYN) IVPB 3.375 g  Status:  Discontinued     3.375 g 12.5 mL/hr over 240 Minutes Intravenous 3 times per day 05/04/13 2336 05/07/13 1534      Assessment/Plan: s/p * No surgery found *  Met cervical Ca--biliary obstr B bili drains intact Will follow Plan per TRH and Onc   LOS: 8 days    Alan Drummer A 05/12/2013

## 2013-05-12 NOTE — Progress Notes (Signed)
INFECTIOUS DISEASE PROGRESS NOTE  ID: [name] is a 63 y.o. female with  Active Problems:   Atrial fibrillation   Cervical cancer   Anemia of chronic disease   Hyponatremia   Protein-calorie malnutrition, severe   Abdominal pain, unspecified site   Malignant obstructive jaundice   Ascending cholangitis   Cholangitis   Sepsis   History of DVT (deep vein thrombosis)   Gram-negative bacteremia   Hypophosphatemia   Hypokalemia  Subjective: Afebrile.Still has some drainage outside of drain.   Abtx:  Anti-infectives   Start     Dose/Rate Route Frequency Ordered Stop   05/10/13 1800  micafungin (MYCAMINE) 100 mg in sodium chloride 0.9 % 100 mL IVPB     100 mg 100 mL/hr over 1 Hours Intravenous Every 24 hours 05/10/13 1533     05/07/13 1600  ceFEPIme (MAXIPIME) 1 g in dextrose 5 % 50 mL IVPB     1 g 100 mL/hr over 30 Minutes Intravenous Every 12 hours 05/07/13 1534     05/05/13 1000  vancomycin (VANCOCIN) IVPB 1000 mg/200 mL premix  Status:  Discontinued     1,000 mg 200 mL/hr over 60 Minutes Intravenous Every 12 hours 05/05/13 0344 05/07/13 1534   05/05/13 0000  vancomycin (VANCOCIN) IVPB 1000 mg/200 mL premix     1,000 mg 200 mL/hr over 60 Minutes Intravenous  Once 05/04/13 2355 05/05/13 0124   05/04/13 2345  piperacillin-tazobactam (ZOSYN) IVPB 3.375 g  Status:  Discontinued     3.375 g 12.5 mL/hr over 240 Minutes Intravenous 3 times per day 05/04/13 2336 05/07/13 1534      Medications:  Scheduled: . ceFEPime (MAXIPIME) IV  1 g Intravenous Q12H  . citalopram  20 mg Oral Daily  . diltiazem  180 mg Oral Daily  . docusate sodium  100 mg Oral BID  . fentaNYL  200 mcg Transdermal Q72H  . loratadine  10 mg Oral Daily  . metoprolol tartrate  25 mg Oral BID  . micafungin (MYCAMINE) IV  100 mg Intravenous Q24H  . pantoprazole  40 mg Oral Daily  . potassium chloride  10 mEq Oral BID  . Rivaroxaban  20 mg Oral QPM  . sodium chloride  3 mL Intravenous Q12H     Objective: Vital signs in last 24 hours: Temp:  [89.1 F (31.7 C)-98 F (36.7 C)] 97.8 F (36.6 C) (03/14 0428) Pulse Rate:  [93-101] 96 (03/14 0428) Resp:  [18-19] 18 (03/14 0428) BP: (119-122)/(70-85) 119/85 mmHg (03/14 0428) SpO2:  [98 %-100 %] 98 % (03/14 0428)  Did not examine Lab Results  Recent Labs  05/10/13 0515 05/12/13 0445  WBC 8.0 7.3  HGB 9.5* 9.2*  HCT 29.4* 28.8*  NA 132* 134*  K 3.2* 3.4*  CL 97 101  CO2 22 25  BUN 5* 6  CREATININE 0.53 0.55   Liver Panel No results found for this basename: PROT, ALBUMIN, AST, ALT, ALKPHOS, BILITOT, BILIDIR, IBILI,  in the last 72 hours Sedimentation Rate No results found for this basename: ESRSEDRATE,  in the last 72 hours C-Reactive Protein No results found for this basename: CRP,  in the last 72 hours  Microbiology: Recent Results (from the past 240 hour(s))  CULTURE, BLOOD (ROUTINE X 2)     Status: None   Collection Time    05/04/13  8:41 PM      Result Value Ref Range Status   Specimen Description BLOOD LEFT ANTECUBITAL   Final   Special  Requests BOTTLES DRAWN AEROBIC AND ANAEROBIC 8ML   Final   Culture  Setup Time     Final   Value: 05/05/2013 01:09     Performed at [facility]   Culture     Final   Value: SERRATIA MARCESCENS     Note: SUSCEPTIBILITIES PERFORMED ON PREVIOUS CULTURE WITHIN THE LAST 5 DAYS.     Note: Gram Stain Report Called to,Read Back By and Verified With: Carilyn HUFF 05/05/13 @ 6:28PM BY RUSCOE A.     Performed at [facility]   Report Status 05/07/2013 FINAL   Final  CULTURE, BLOOD (ROUTINE X 2)     Status: None   Collection Time    05/04/13  9:40 PM      Result Value Ref Range Status   Specimen Description BLOOD RIGHT PORTA CATH   Final   Special Requests BOTTLES DRAWN AEROBIC AND ANAEROBIC 5CC   Final   Culture  Setup Time     Final   Value: 05/05/2013 01:09     Performed at [facility]   Culture     Final   Value: SERRATIA MARCESCENS     Note:  Gram Stain Report Called to,Read Back By and Verified With: [name] 05/05/13 @ 6:28PM BY RUSCOE A.     Performed at [facility]   Report Status 05/07/2013 FINAL   Final   Organism ID, Bacteria SERRATIA MARCESCENS   Final  CULTURE, BLOOD (ROUTINE X 2)     Status: None   Collection Time    05/07/13  4:25 PM      Result Value Ref Range Status   Specimen Description BLOOD RIGHT ARM   Final   Special Requests BOTTLES DRAWN AEROBIC AND ANAEROBIC 10CC EACH   Final   Culture  Setup Time     Final   Value: 05/07/2013 20:56     Performed at [facility]   Culture     Final   Value:        BLOOD CULTURE RECEIVED NO GROWTH TO DATE CULTURE WILL BE HELD FOR 5 DAYS BEFORE ISSUING A FINAL NEGATIVE REPORT     Performed at [facility]   Report Status PENDING   Incomplete  CULTURE, BLOOD (ROUTINE X 2)     Status: None   Collection Time    05/07/13  4:36 PM      Result Value Ref Range Status   Specimen Description BLOOD LEFT ARM   Final   Special Requests BOTTLES DRAWN AEROBIC AND ANAEROBIC 5CC EACH   Final   Culture  Setup Time     Final   Value: 05/08/2013 13:28     Performed at [facility]   Culture     Final   Value:        BLOOD CULTURE RECEIVED NO GROWTH TO DATE CULTURE WILL BE HELD FOR 5 DAYS BEFORE ISSUING A FINAL NEGATIVE REPORT     Performed at [facility]   Report Status PENDING   Incomplete  CLOSTRIDIUM DIFFICILE BY PCR     Status: None   Collection Time    05/08/13  5:12 AM      Result Value Ref Range Status   C difficile by pcr NEGATIVE  NEGATIVE Final   Comment: Performed at [facility]  BODY FLUID CULTURE     Status: None   Collection Time    05/08/13  2:20 PM  Result Value Ref Range Status   Specimen Description LIVER BILE FROM BILIARY DRAINS LEFT   Final   Special Requests Normal   Final   Gram Stain     Final   Value: NO WBC SEEN     NO ORGANISMS SEEN     Performed at [facility]   Culture     Final    Value: FEW ENTEROBACTER CLOACAE     Note: CRITICAL RESULT CALLED TO, READ BACK BY AND VERIFIED WITH: BESS B@10 :13AM ON 05/09/13 BY DANTS     FEW YEAST     Performed at [facility]   Report Status PENDING   Incomplete   Organism ID, Bacteria ENTEROBACTER CLOACAE   Final  BODY FLUID CULTURE     Status: None   Collection Time    05/08/13  2:40 PM      Result Value Ref Range Status   Specimen Description LIVER Bile from biliary drain RIGHT   Final   Special Requests NONE   Final   Gram Stain     Final   Value: NO WBC SEEN     NO ORGANISMS SEEN     Performed at [facility]   Culture     Final   Value: FEW ENTEROBACTER CLOACAE     Note: CRITICAL RESULT CALLED TO, READ BACK BY AND VERIFIED WITH: BESS B@10 :13AM ON 05/09/13 BY DANTS     FEW YEAST     Performed at [facility]   Report Status PENDING   Incomplete    Studies/Results: No results found.   Assessment/Plan: Serratia Bacteremia  Cervical Ca, stage IV   Liver metastases, biliary obstruction Loose BM (C diff -)  Total days of antibiotics: 9(cefepime day 6 & mica 3)   presumed Hepatic abscess vs. Biliary content= cx showing enterobacter plus yeast. Enterobacter is S cefepime S cipro S bactrim. Continue on  micafungin and cefepime for now. Awaiting ID on yeast to see if can change micafungin to fluconazole. Would treat for 2 wk and reassess if need continuation at that time. Last day of antibiotics would be 3/24. Based on susceptibilities of serratia and enterobacter she could be discharged on oral ciprofloxacin 500mg  BID plus antifungal. If patient is clinically stable to discharge, can send her home on micafungin via port then follow up on labs to convert over to fluconazole if needed based upon micro results.  Serratia bacteremia = appears that port is also involved since initial cx had identified from port and peripheral blood cx on 3/6. Repeat blood cx appear NGTD on 3/9. Continue with cefepime for now  would treat for 14 days until 05/22/12. Can switch to cipro 500mg  BID when ready for discharge.  Recommend to get repeat blood culture on 05/25/12 to ensure no recurrence of serratia if she still has port in place.  The most conservative measure is to recommend port removal so that it does not pose as a further nidus of infection in the future. She states that she does not know if she is to undergo any furhter chemo therapy. This does not need to be removed at this admission but should be weighted in the decision if she gets further chemotherpay  We will have her followed in ID clinic at end of antibiotics for hepatic process and bacteremia         Marshaun Lortie Infectious Diseases (pager) [phone] [email or web address] 05/12/2013, 10:54 AM  LOS: 8 days

## 2013-05-13 LAB — BODY FLUID CULTURE
GRAM STAIN: NONE SEEN
Gram Stain: NONE SEEN
Special Requests: NORMAL

## 2013-05-13 LAB — CULTURE, BLOOD (ROUTINE X 2): Culture: NO GROWTH

## 2013-05-13 MED ORDER — HYDROMORPHONE HCL 2 MG PO TABS
4.0000 mg | ORAL_TABLET | ORAL | Status: DC | PRN
  Administered 2013-05-13 – 2013-05-15 (×14): 4 mg via ORAL
  Filled 2013-05-13 (×13): qty 2

## 2013-05-13 NOTE — Progress Notes (Signed)
INITIAL NUTRITION ASSESSMENT  DOCUMENTATION CODES Per approved criteria  -Not Applicable   INTERVENTION: Will send [employer] Essentials BID, each supplement provides 150-240 calories and 12 g of protein.  NUTRITION DIAGNOSIS: Inadequate oral intake related to cervical cancer with mets to liver as evidenced by poor appetite and poor po intake.   Goal: Pt to meet >/= 90% of their estimated nutrition needs   Monitor:  Wt trends, acceptance of supplements, labs  Reason for Assessment: MST  63 y.o. female  Admitting Dx: <principal problem not specified>  ASSESSMENT: 63 year old with history of cervical cancer diagnosed in 2005 status post total hysterectomy with recurrence in 2010 status post chemotherapy and radiation treatment found to have metastatic spread to the liver since 2012. The patient subsequently developed biliary obstruction of which stenting was performed by interventional radiology. Patient presents for this admission with sepsis and gram-negative bacteremia growing Serratia marcescens.   Pt reports that her appetite is poor, but she has been eating because she knows it is important. She was offered a variety of supplements and she refused all except Radiation protection practitioner. Pt is 3 lbs below usual body weight of 160.  Height: Ht Readings from Last 1 Encounters:  05/04/13 $RemoveB'5\' 1"'tOGMTwJO$  (1.549 m)    Weight: Wt Readings from Last 1 Encounters:  05/04/13 157 lb 12.8 oz (71.578 kg)    Ideal Body Weight: 47.8 kg  % Ideal Body Weight: 150%  Wt Readings from Last 10 Encounters:  05/04/13 157 lb 12.8 oz (71.578 kg)  05/03/13 160 lb 6.4 oz (72.757 kg)  04/27/13 158 lb (71.668 kg)  04/19/13 158 lb 9.6 oz (71.94 kg)  04/16/13 155 lb (70.308 kg)  04/10/13 160 lb 11.2 oz (72.893 kg)  04/05/13 161 lb 12.8 oz (73.392 kg)  03/29/13 166 lb (75.297 kg)  03/15/13 163 lb 4.8 oz (74.072 kg)  03/08/13 166 lb 4.8 oz (75.433 kg)    Usual Body  Weight: 160 lbs  % Usual Body Weight: 98%  BMI:  Body mass index is 29.83 kg/(m^2).  Estimated Nutritional Needs: Kcal: 1800-1950 Protein: 85-95 g Fluid: 1.8-2.0 L/day  Skin: incision on groin  Diet Order: General  EDUCATION NEEDS: -Education needs addressed   Intake/Output Summary (Last 24 hours) at 05/13/13 1419 Last data filed at 05/13/13 1017  Gross per 24 hour  Intake   2060 ml  Output   3745 ml  Net  -1685 ml    Last BM: 3/13   Labs:   Recent Labs Lab 05/08/13 0350 05/09/13 0515 05/10/13 0515 05/12/13 0445  NA  --  135* 132* 134*  K  --  3.5* 3.2* 3.4*  CL  --  101 97 101  CO2  --  $R'23 22 25  'jt$ BUN  --  4* 5* 6  CREATININE  --  0.57 0.53 0.55  CALCIUM  --  8.3* 8.3* 8.3*  PHOS 2.1* 2.5  --   --   GLUCOSE  --  108* 107* 121*    CBG (last 3)  No results found for this basename: GLUCAP,  in the last 72 hours  Scheduled Meds: . ceFEPime (MAXIPIME) IV  1 g Intravenous Q12H  . citalopram  20 mg Oral Daily  . diltiazem  180 mg Oral Daily  . docusate sodium  100 mg Oral BID  . fentaNYL  200 mcg Transdermal Q72H  . loratadine  10 mg Oral Daily  . metoprolol tartrate  25 mg Oral BID  . micafungin [facility])  IV  100 mg Intravenous Q24H  . pantoprazole  40 mg Oral Daily  . potassium chloride  10 mEq Oral BID  . Rivaroxaban  20 mg Oral QPM  . sodium chloride  3 mL Intravenous Q12H    Continuous Infusions: . sodium chloride 100 mL/hr at 05/12/13 2215    Past Medical History  Diagnosis Date  . Diastolic dysfunction   . MIGRAINE HEADACHE   . Atrial fibrillation     failed DCCV, CHADs2-prev pradaxa stopped due to hematuria with ureter issues/JJ   . Anemia   . HTN (hypertension)   . GLAUCOMA   . CARPAL TUNNEL SYNDROME, RIGHT   . Lymphedema     L>R leg from pelvic XRT/scarring  . Hepatic steatosis     CT scan 06/2009, 12/2009  . DVT (deep venous thrombosis) 10/2010    LLE, anticoag resumed  . CHF (congestive heart failure)     1 yr ago per pt  .  Arthritis   . GERD (gastroesophageal reflux disease)   . Small kidney     left  . Radiation 10/11/11-11/19/11    5040 cGy in 28 fx's/periaortic  . Radiation 03/16/10-03/25/10    right inguinal node/left external iliac node  . Radiation 06/11/08-07/23/08    6000 cGy left pelvis  . Hyperlipidemia   . Leg swelling     left  . Cervical cancer dx 04/2003, recur 04/2008    s/p debulk (TAHBSO), chemo/XRT; recurrent dz L pelvis dx 2010 and liver met 09/2010 s/p RFA  . Osteoporosis with fracture 2014    compression fx, s/p KP ; started prolia 06/2012- narcotic pain mgmt    Past Surgical History  Procedure Laterality Date  . Ureteral stent placement  2005, 01/2010, 07/2010    L hydro related to cervical ca and XRT  . Oophorectomy  2005  . Cardioversion    . Tonsillectomy    . Cholecystectomy  1984  . Total abdominal hysterectomy  2005  . Carpal tunnel  2009    right  . Ivc filter  04/2011    Terrace Arabia RD, LDN

## 2013-05-13 NOTE — Progress Notes (Addendum)
INFECTIOUS DISEASE PROGRESS NOTE  ID: [name] is a 63 y.o. female with  Active Problems:   Atrial fibrillation   Cervical cancer   Anemia of chronic disease   Hyponatremia   Protein-calorie malnutrition, severe   Abdominal pain, unspecified site   Malignant obstructive jaundice   Ascending cholangitis   Cholangitis   Sepsis   History of DVT (deep vein thrombosis)   Gram-negative bacteremia   Hypophosphatemia   Hypokalemia  Subjective: Feeling poorly today with nausea, had a brief episode of diaphoresis. Still having abd pain due to drains and increase drainage outside of drains which dressing changed x 3 overnight.   Abtx:  Anti-infectives   Start     Dose/Rate Route Frequency Ordered Stop   05/10/13 1800  micafungin (MYCAMINE) 100 mg in sodium chloride 0.9 % 100 mL IVPB     100 mg 100 mL/hr over 1 Hours Intravenous Every 24 hours 05/10/13 1533     05/07/13 1600  ceFEPIme (MAXIPIME) 1 g in dextrose 5 % 50 mL IVPB     1 g 100 mL/hr over 30 Minutes Intravenous Every 12 hours 05/07/13 1534     05/05/13 1000  vancomycin (VANCOCIN) IVPB 1000 mg/200 mL premix  Status:  Discontinued     1,000 mg 200 mL/hr over 60 Minutes Intravenous Every 12 hours 05/05/13 0344 05/07/13 1534   05/05/13 0000  vancomycin (VANCOCIN) IVPB 1000 mg/200 mL premix     1,000 mg 200 mL/hr over 60 Minutes Intravenous  Once 05/04/13 2355 05/05/13 0124   05/04/13 2345  piperacillin-tazobactam (ZOSYN) IVPB 3.375 g  Status:  Discontinued     3.375 g 12.5 mL/hr over 240 Minutes Intravenous 3 times per day 05/04/13 2336 05/07/13 1534      Medications:  Scheduled: . ceFEPime (MAXIPIME) IV  1 g Intravenous Q12H  . citalopram  20 mg Oral Daily  . diltiazem  180 mg Oral Daily  . docusate sodium  100 mg Oral BID  . fentaNYL  200 mcg Transdermal Q72H  . loratadine  10 mg Oral Daily  . metoprolol tartrate  25 mg Oral BID  . micafungin (MYCAMINE) IV  100 mg Intravenous Q24H  . pantoprazole  40 mg  Oral Daily  . potassium chloride  10 mEq Oral BID  . Rivaroxaban  20 mg Oral QPM  . sodium chloride  3 mL Intravenous Q12H    Objective: Vital signs in last 24 hours: Temp:  [98 F (36.7 C)-98.5 F (36.9 C)] 98 F (36.7 C) (03/15 0423) Pulse Rate:  [106-117] 117 (03/15 0423) Resp:  [18-20] 18 (03/14 2023) BP: (124-139)/(85-89) 135/89 mmHg (03/15 0423) SpO2:  [100 %] 100 % (03/15 0423)  Physical Exam  Constitutional:  oriented to person, place, and time. appears well-developed and well-nourished. No distress. Fatigued and pale appearing HENT:  Mouth/Throat: Oropharynx is clear and moist. No oropharyngeal exudate.  Cardiovascular: Normal rate, regular rhythm and normal heart sounds. Exam reveals no gallop and no friction rub.  No murmur heard.  Pulmonary/Chest: Effort normal and breath sounds normal. No respiratory distress.  has no wheezes.  Abdominal: Soft. Bowel sounds are normal.  exhibits no distension. There is no tenderness. Abdominal drains in place Lymphadenopathy: no cervical adenopathy.  Skin: Skin is warm and dry. No rash noted. No erythema.    Lab Results  Recent Labs  05/12/13 0445  WBC 7.3  HGB 9.2*  HCT 28.8*  NA 134*  K 3.4*  CL 101  CO2 25  BUN 6  CREATININE 0.55   Microbiology: Recent Results (from the past 240 hour(s))  CULTURE, BLOOD (ROUTINE X 2)     Status: None   Collection Time    05/04/13  8:41 PM      Result Value Ref Range Status   Specimen Description BLOOD LEFT ANTECUBITAL   Final   Special Requests BOTTLES DRAWN AEROBIC AND ANAEROBIC 8ML   Final   Culture  Setup Time     Final   Value: 05/05/2013 01:09     Performed at [facility]   Culture     Final   Value: SERRATIA MARCESCENS     Note: SUSCEPTIBILITIES PERFORMED ON PREVIOUS CULTURE WITHIN THE LAST 5 DAYS.     Note: Gram Stain Report Called to,Read Back By and Verified With: [name] 05/05/13 @ 6:28PM BY RUSCOE A.     Performed at [facility]   Report Status  05/07/2013 FINAL   Final  CULTURE, BLOOD (ROUTINE X 2)     Status: None   Collection Time    05/04/13  9:40 PM      Result Value Ref Range Status   Specimen Description BLOOD RIGHT PORTA CATH   Final   Special Requests BOTTLES DRAWN AEROBIC AND ANAEROBIC 5CC   Final   Culture  Setup Time     Final   Value: 05/05/2013 01:09     Performed at [facility]   Culture     Final   Value: SERRATIA MARCESCENS     Note: Gram Stain Report Called to,Read Back By and Verified With: [name] 05/05/13 @ 6:28PM BY RUSCOE A.     Performed at [facility]   Report Status 05/07/2013 FINAL   Final   Organism ID, Bacteria SERRATIA MARCESCENS   Final  CULTURE, BLOOD (ROUTINE X 2)     Status: None   Collection Time    05/07/13  4:25 PM      Result Value Ref Range Status   Specimen Description BLOOD RIGHT ARM   Final   Special Requests BOTTLES DRAWN AEROBIC AND ANAEROBIC 10CC EACH   Final   Culture  Setup Time     Final   Value: 05/07/2013 20:56     Performed at [facility]   Culture     Final   Value:        BLOOD CULTURE RECEIVED NO GROWTH TO DATE CULTURE WILL BE HELD FOR 5 DAYS BEFORE ISSUING A FINAL NEGATIVE REPORT     Performed at [facility]   Report Status PENDING   Incomplete  CULTURE, BLOOD (ROUTINE X 2)     Status: None   Collection Time    05/07/13  4:36 PM      Result Value Ref Range Status   Specimen Description BLOOD LEFT ARM   Final   Special Requests BOTTLES DRAWN AEROBIC AND ANAEROBIC 5CC EACH   Final   Culture  Setup Time     Final   Value: 05/08/2013 13:28     Performed at [facility]   Culture     Final   Value:        BLOOD CULTURE RECEIVED NO GROWTH TO DATE CULTURE WILL BE HELD FOR 5 DAYS BEFORE ISSUING A FINAL NEGATIVE REPORT     Performed at [facility]   Report Status PENDING   Incomplete  CLOSTRIDIUM DIFFICILE BY PCR     Status: None   Collection  Time    05/08/13  5:12 AM      Result Value Ref Range Status   C  difficile by pcr NEGATIVE  NEGATIVE Final   Comment: Performed at [facility]     Status: None   Collection Time    05/08/13  2:20 PM      Result Value Ref Range Status   Specimen Description LIVER BILE FROM BILIARY DRAINS LEFT   Final   Special Requests Normal   Final   Gram Stain     Final   Value: NO WBC SEEN     NO ORGANISMS SEEN     Performed at [facility]   Culture     Final   Value: FEW ENTEROBACTER CLOACAE     Note: CRITICAL RESULT CALLED TO, READ BACK BY AND VERIFIED WITH: BESS B@10 :13AM ON 05/09/13 BY DANTS     FEW YEAST     Performed at [facility]   Report Status PENDING   Incomplete   Organism ID, Bacteria ENTEROBACTER CLOACAE   Final  BODY FLUID CULTURE     Status: None   Collection Time    05/08/13  2:40 PM      Result Value Ref Range Status   Specimen Description LIVER Bile from biliary drain RIGHT   Final   Special Requests NONE   Final   Gram Stain     Final   Value: NO WBC SEEN     NO ORGANISMS SEEN     Performed at [facility]   Culture     Final   Value: FEW ENTEROBACTER CLOACAE     Note: CRITICAL RESULT CALLED TO, READ BACK BY AND VERIFIED WITH: BESS B@10 :13AM ON 05/09/13 BY DANTS     FEW YEAST     Performed at [facility]   Report Status PENDING   Incomplete    Studies/Results: No results found.   Assessment/Plan: Serratia Bacteremia  Cervical Ca, stage IV   Liver metastases, biliary obstruction Loose BM (C diff -)  Total days of antibiotics: 10(cefepime day 7 & mica 4)   presumed Hepatic abscess vs. Biliary content= cx showing enterobacter plus c.lusitaneiae. Enterobacter is S cefepime S cipro S bactrim. Continue on  micafungin and cefepime for now. C. lusitaneiae is usually S to micafungin and fluconazole. Would treat for 2 wk and reassess if need continuation at that time. Last day of antibiotics would be 3/24. Based on susceptibilities of serratia and enterobacter she could be  discharged on oral ciprofloxacin 500mg  BID plus antifungal. When patient is clinically stable to discharge, can send her home on cipro plus fluconazole 400mg  daily so that can avoid need to use port.  Serratia bacteremia = appears that port is also involved since initial cx had identified from port and peripheral blood cx on 3/6. Repeat blood cx appear NGTD on 3/9. Continue with cefepime for now would treat for 14 days until 05/22/12. Can switch to cipro 500mg  BID when ready for discharge.  Recommend to get repeat blood culture on 05/25/12 to ensure no recurrence of serratia if she still has port in place.  The most conservative measure is to recommend port removal so that it does not pose as a further nidus of infection in the future. She states that she does not know if she is to undergo any furhter chemo therapy. This does not need to be removed at this admission but should be weighted in  the decision if she gets further chemotherpay  We will have her followed in ID clinic at end of antibiotics for hepatic process and bacteremia.   Will sign off. Dr. Johnnye Sima available for questions         Carlyle Basques Infectious Diseases (pager) [phone] (cell) [phone] [email or web address] 05/13/2013, 11:03 AM  LOS: 9 days

## 2013-05-13 NOTE — Progress Notes (Signed)
Rt. Biliary tube dressing changed. Pt tolerated well. Will continue to monitor pt. [name]

## 2013-05-13 NOTE — Progress Notes (Signed)
[facility] PROGRESS NOTE  [name] [id number] DOB: April 02, 1950 DOA: 05/04/2013 PCP: [name] Grant, MD  Brief narrative:  Patient is a 63 year old with history of cervical cancer diagnosed in 2005 status post total hysterectomy with recurrence in 2010 status post chemotherapy and radiation treatment found to have metastatic spread to the liver since 2012. The patient subsequently developed biliary obstruction of which stenting was performed by interventional radiology. Patient presents for this admission with sepsis and gram-negative bacteremia growing Serratia marcescens.   Assessment/Plan:  Sepsis/ gram negative bacteremia - Will continue broad spectrum antibiotics at this juncture - Pt growing: SERRATIA MARCESCENS -Repeat BC (3/9) pending. If negative, we may be able to salvage her port. Altho ID considering removal regardless. -AppreciateID input re: choice and length of antibiotic treatment (depends on cx). -Sepsis parameters resolved. -Cx from biliary drain with enterobacter and yeast. Await sensitivities of yeast.    Atrial fibrillation - At this point we'll continue metoprolol and diltiazem. - Currently patient is on xarelto    Cervical cancer - Pt to follow up with oncologist on discharge    Anemia of chronic disease - Hemoglobin currently stable    Hyponatremia - Most likely due to poor oral solute intake. Currently improving and suspect continued improvement with improvement in oral intake.     Protein-calorie malnutrition, severe - Currently on regular diet    History of DVT (deep vein thrombosis) - Patient is currently on xarelto  Code Status: full Family Communication: Husband at bedside updated on plan of care. Disposition Plan: Home likely 48 hours.   Consultants:  IR  ID  Procedures:  None  Antibiotics:  Patient is on cefepime/micafungin  HPI/Subjective: Still with some pain at drain site. In good spirits  today.  Objective: Filed Vitals:   05/13/13 1450  BP: 146/94  Pulse: 84  Temp: 97.5 F (36.4 C)  Resp: 18    Intake/Output Summary (Last 24 hours) at 05/13/13 1605 Last data filed at 05/13/13 1017  Gross per 24 hour  Intake   1690 ml  Output   3145 ml  Net  -1455 ml   Filed Weights   05/04/13 1931 05/04/13 2342  Weight: 72.576 kg (160 lb) 71.578 kg (157 lb 12.8 oz)    Exam:   General:  alert and awake  Cardiovascular: RRR, no MRG  Respiratory: CTA BL, no wheezes  Abdomen: increased pain today, nondistended, hypoactive bowel sounds   Musculoskeletal: no cyanosis or clubbing   Data Reviewed: Basic Metabolic Panel:  Recent Labs Lab 05/07/13 0430 05/08/13 0350 05/09/13 0515 05/10/13 0515 05/12/13 0445  NA 135*  --  135* 132* 134*  K 3.3*  --  3.5* 3.2* 3.4*  CL 102  --  101 97 101  CO2 22  --  23 22 25   GLUCOSE 92  --  108* 107* 121*  BUN 5*  --  4* 5* 6  CREATININE 0.59  --  0.57 0.53 0.55  CALCIUM 7.7*  --  8.3* 8.3* 8.3*  PHOS  --  2.1* 2.5  --   --    Liver Function Tests:  Recent Labs Lab 05/09/13 0515  AST 18  ALT 16  ALKPHOS 589*  BILITOT 2.9*  PROT 5.5*  ALBUMIN 1.7*   No results found for this basename: LIPASE, AMYLASE,  in the last 168 hours No results found for this basename: AMMONIA,  in the last 168 hours CBC:  Recent Labs Lab 05/07/13 0430 05/09/13 0515 05/10/13 0515 05/12/13 0445  WBC 5.3 6.1 8.0 7.3  HGB 8.3* 9.0* 9.5* 9.2*  HCT 24.8* 27.9* 29.4* 28.8*  MCV 91.9 90.6 90.5 90.9  PLT 252 281 290 304   Cardiac Enzymes: No results found for this basename: CKTOTAL, CKMB, CKMBINDEX, TROPONINI,  in the last 168 hours BNP (last 3 results) No results found for this basename: PROBNP,  in the last 8760 hours CBG: No results found for this basename: GLUCAP,  in the last 168 hours  Recent Results (from the past 240 hour(s))  CULTURE, BLOOD (ROUTINE X 2)     Status: None   Collection Time    05/04/13  8:41 PM      Result  Value Ref Range Status   Specimen Description BLOOD LEFT ANTECUBITAL   Final   Special Requests BOTTLES DRAWN AEROBIC AND ANAEROBIC 8ML   Final   Culture  Setup Time     Final   Value: 05/05/2013 01:09     Performed at [facility]   Culture     Final   Value: SERRATIA MARCESCENS     Note: SUSCEPTIBILITIES PERFORMED ON PREVIOUS CULTURE WITHIN THE LAST 5 DAYS.     Note: Gram Stain Report Called to,Read Back By and Verified With: Whitlee HUFF 05/05/13 @ 6:28PM BY RUSCOE A.     Performed at [facility]   Report Status 05/07/2013 FINAL   Final  CULTURE, BLOOD (ROUTINE X 2)     Status: None   Collection Time    05/04/13  9:40 PM      Result Value Ref Range Status   Specimen Description BLOOD RIGHT PORTA CATH   Final   Special Requests BOTTLES DRAWN AEROBIC AND ANAEROBIC 5CC   Final   Culture  Setup Time     Final   Value: 05/05/2013 01:09     Performed at [facility]   Culture     Final   Value: SERRATIA MARCESCENS     Note: Gram Stain Report Called to,Read Back By and Verified With: [name] 05/05/13 @ 6:28PM BY RUSCOE A.     Performed at [facility]   Report Status 05/07/2013 FINAL   Final   Organism ID, Bacteria SERRATIA MARCESCENS   Final  CULTURE, BLOOD (ROUTINE X 2)     Status: None   Collection Time    05/07/13  4:25 PM      Result Value Ref Range Status   Specimen Description BLOOD RIGHT ARM   Final   Special Requests BOTTLES DRAWN AEROBIC AND ANAEROBIC 10CC EACH   Final   Culture  Setup Time     Final   Value: 05/07/2013 20:56     Performed at [facility]   Culture     Final   Value: NO GROWTH 5 DAYS     Performed at [facility]   Report Status 05/13/2013 FINAL   Final  CULTURE, BLOOD (ROUTINE X 2)     Status: None   Collection Time    05/07/13  4:36 PM      Result Value Ref Range Status   Specimen Description BLOOD LEFT ARM   Final   Special Requests BOTTLES DRAWN AEROBIC AND ANAEROBIC [facility] EACH   Final   Culture  Setup  Time     Final   Value: 05/08/2013 13:28     Performed at [facility]   Culture     Final   Value:  BLOOD CULTURE RECEIVED NO GROWTH TO DATE CULTURE WILL BE HELD FOR 5 DAYS BEFORE ISSUING A FINAL NEGATIVE REPORT     Performed at [facility]   Report Status PENDING   Incomplete  CLOSTRIDIUM DIFFICILE BY PCR     Status: None   Collection Time    05/08/13  5:12 AM      Result Value Ref Range Status   C difficile by pcr NEGATIVE  NEGATIVE Final   Comment: Performed at [facility]  BODY FLUID CULTURE     Status: None   Collection Time    05/08/13  2:20 PM      Result Value Ref Range Status   Specimen Description LIVER BILE FROM BILIARY DRAINS LEFT   Final   Special Requests Normal   Final   Gram Stain     Final   Value: NO WBC SEEN     NO ORGANISMS SEEN     Performed at [facility]   Culture     Final   Value: FEW ENTEROBACTER CLOACAE     Note: CRITICAL RESULT CALLED TO, READ BACK BY AND VERIFIED WITH: BESS B@10 :13AM ON 05/09/13 BY DANTS     FEW CANDIDA LUSITANIAE     Performed at [facility]   Report Status 05/13/2013 FINAL   Final   Organism ID, Bacteria ENTEROBACTER CLOACAE   Final  BODY FLUID CULTURE     Status: None   Collection Time    05/08/13  2:40 PM      Result Value Ref Range Status   Specimen Description LIVER Bile from biliary drain RIGHT   Final   Special Requests NONE   Final   Gram Stain     Final   Value: NO WBC SEEN     NO ORGANISMS SEEN     Performed at [facility]   Culture     Final   Value: FEW ENTEROBACTER CLOACAE     Note: SUSCEPTIBILITIES PERFORMED ON PREVIOUS CULTURE WITHIN THE LAST 5 DAYS. CRITICAL RESULT CALLED TO, READ BACK BY AND VERIFIED WITH: BESS B@10 :13AM ON 05/09/13 BY DANTS     FEW CANDIDA LUSITANIAE     Performed at [facility]   Report Status 05/13/2013 FINAL   Final     Studies: No results found.  Scheduled Meds: . ceFEPime (MAXIPIME) IV  1 g Intravenous Q12H  .  citalopram  20 mg Oral Daily  . diltiazem  180 mg Oral Daily  . docusate sodium  100 mg Oral BID  . fentaNYL  200 mcg Transdermal Q72H  . loratadine  10 mg Oral Daily  . metoprolol tartrate  25 mg Oral BID  . micafungin (MYCAMINE) IV  100 mg Intravenous Q24H  . pantoprazole  40 mg Oral Daily  . potassium chloride  10 mEq Oral BID  . Rivaroxaban  20 mg Oral QPM  . sodium chloride  3 mL Intravenous Q12H   Continuous Infusions: . sodium chloride 100 mL/hr at 05/12/13 2215     Time spent: 25 minutes    [facility] Pager [phone]  If 7PM-7AM, please contact night-coverage at [email or web address], password [facility] 05/13/2013, 4:05 PM  LOS: 9 days

## 2013-05-14 LAB — COMPREHENSIVE METABOLIC PANEL
ALT: 13 U/L (ref 0–35)
AST: 17 U/L (ref 0–37)
Albumin: 1.6 g/dL — ABNORMAL LOW (ref 3.5–5.2)
Alkaline Phosphatase: 588 U/L — ABNORMAL HIGH (ref 39–117)
BUN: 5 mg/dL — ABNORMAL LOW (ref 6–23)
CO2: 26 meq/L (ref 19–32)
Calcium: 8 mg/dL — ABNORMAL LOW (ref 8.4–10.5)
Chloride: 99 mEq/L (ref 96–112)
Creatinine, Ser: 0.48 mg/dL — ABNORMAL LOW (ref 0.50–1.10)
GFR calc Af Amer: >90 mL/min (ref >90)
Glucose, Bld: 111 mg/dL — ABNORMAL HIGH (ref 70–99)
Potassium: 3.4 mEq/L — ABNORMAL LOW (ref 3.7–5.3)
SODIUM: 134 meq/L — AB (ref 137–147)
Total Bilirubin: 2.6 mg/dL — ABNORMAL HIGH (ref 0.3–1.2)
Total Protein: 5.4 g/dL — ABNORMAL LOW (ref 6.0–8.3)

## 2013-05-14 LAB — CBC
HCT: 28.6 % — ABNORMAL LOW (ref 36.0–46.0)
HEMOGLOBIN: 9.1 g/dL — AB (ref 12.0–15.0)
MCH: 29 pg (ref 26.0–34.0)
MCHC: 31.8 g/dL (ref 30.0–36.0)
MCV: 91.1 fL (ref 78.0–100.0)
Platelets: 294 10*3/uL (ref 150–400)
RBC: 3.14 MIL/uL — ABNORMAL LOW (ref 3.87–5.11)
RDW: 14.4 % (ref 11.5–15.5)
WBC: 7.3 10*3/uL (ref 4.0–10.5)

## 2013-05-14 LAB — CULTURE, BLOOD (ROUTINE X 2): Culture: NO GROWTH

## 2013-05-14 MED ORDER — FLUCONAZOLE 200 MG PO TABS
400.0000 mg | ORAL_TABLET | Freq: Every day | ORAL | Status: DC
  Administered 2013-05-14 – 2013-05-15 (×2): 400 mg via ORAL
  Filled 2013-05-14 (×2): qty 2

## 2013-05-14 MED ORDER — METOPROLOL TARTRATE 25 MG PO TABS
25.0000 mg | ORAL_TABLET | Freq: Once | ORAL | Status: AC
  Administered 2013-05-14: 25 mg via ORAL
  Filled 2013-05-14: qty 1

## 2013-05-14 MED ORDER — CIPROFLOXACIN HCL 500 MG PO TABS
500.0000 mg | ORAL_TABLET | Freq: Two times a day (BID) | ORAL | Status: DC
  Administered 2013-05-14 – 2013-05-15 (×2): 500 mg via ORAL
  Filled 2013-05-14 (×4): qty 1

## 2013-05-14 MED ORDER — METOPROLOL TARTRATE 50 MG PO TABS
50.0000 mg | ORAL_TABLET | Freq: Two times a day (BID) | ORAL | Status: DC
  Administered 2013-05-14 – 2013-05-15 (×2): 50 mg via ORAL
  Filled 2013-05-14 (×4): qty 1

## 2013-05-14 NOTE — Progress Notes (Signed)
Subjective: Reports some pain at right biliary drain site. S/p upsize of right biliary drain to 12Fr  Objective: Physical Exam: BP 118/72  Pulse 102  Temp(Src) 98.3 F (36.8 C) (Oral)  Resp 20  Ht 5\' 1"  (1.549 m)  Wt 164 lb 9.6 oz (74.662 kg)  BMI 31.12 kg/m2  SpO2 98% Anterior (left) biliary drain site clean, dry. Dark bilious output with some debris. 159mL recorded Lateral (right) biliary drain intact. Dressing with some bile staining, small amount of leakage from skin site. Good thin bilious output, >585mL recorded yesterday Bili down to 2.6    Labs: CBC  Recent Labs  05/12/13 0445 05/14/13 0448  WBC 7.3 7.3  HGB 9.2* 9.1*  HCT 28.8* 28.6*  PLT 304 294   BMET  Recent Labs  05/12/13 0445 05/14/13 0448  NA 134* 134*  K 3.4* 3.4*  CL 101 99  CO2 25 26  GLUCOSE 121* 111*  BUN 6 5*  CREATININE 0.55 0.48*  CALCIUM 8.3* 8.0*   LFT  Recent Labs  05/14/13 0448  PROT 5.4*  ALBUMIN 1.6*  AST 17  ALT 13  ALKPHOS 588*  BILITOT 2.6*   PT/INR No results found for this basename: LABPROT, INR,  in the last 72 hours   Studies/Results: No results found.  Assessment/Plan: s/p bilateral biliary drain placements 2/27 for obstructive jaundice secondary to metastatic cervical cancer. (R) biliary drain exchanged and upsized to 12Fr. Good output, small amount of leakage from skin site despite upsize. Will have to continue to be diligent about keeping clean dressing in place to prevent skin irritation.  Serratia bacteremia. Repeat Blood Cultures negative. ID recommending port removal at some point, not necessarily this admission, but once pt follows up with Oncology and it is determined is she will have further chemotherapy      LOS: 10 days    Ascencion Dike PA-C 05/14/2013 1:37 PM

## 2013-05-14 NOTE — Progress Notes (Signed)
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 The patient subsequently developed biliary obstruction of which stenting was performed by interventional radiology. Patient presents for this admission with sepsis and gram-negative bacteremia growing Serratia marcescens.   Assessment/Plan:  Sepsis/ gram negative bacteremia -Initial BC with serratia; repeat 3/9 remain negative. -Biliary drain cx with eneterobacter and candida. -Per ID, will do Cipro/fluconazole on DC until 3/24.    Atrial fibrillation with RVR - HR up to 190 today. -Have increased metoprolol dose, continue cardizem; will hold DC today. - Currently patient is on xarelto    Cervical cancer - Pt to follow up with oncologist on discharge    Anemia of chronic disease - Hemoglobin currently stable    Hyponatremia - Most likely due to poor oral solute intake. Currently improving and suspect continued improvement with improvement in oral intake.     Protein-calorie malnutrition, severe - Currently on regular diet    History of DVT (deep vein thrombosis) - Patient is currently on xarelto  Code Status: full Family Communication: Husband at bedside updated on plan of care. Disposition Plan: Home likely 24-48 hours.   Consultants:  IR  ID  Procedures:  None  Antibiotics:  Cipro  Fluconazole  HPI/Subjective: Still with some pain at drain site. In good spirits today. HR 170-190.  Objective: Filed Vitals:   05/14/13 1441  BP: 135/93  Pulse: 125  Temp: 98.3 F (36.8 C)  Resp: 20    Intake/Output Summary (Last 24 hours) at 05/14/13 1451 Last data filed at 05/14/13 1300  Gross per  24 hour  Intake 2191.67 ml  Output   3150 ml  Net -958.33 ml   Filed Weights   05/04/13 1931 05/04/13 2342 05/14/13 0533  Weight: 72.576 kg (160 lb) 71.578 kg (157 lb 12.8 oz) 74.662 kg (164 lb 9.6 oz)    Exam:   General:  alert and awake  Cardiovascular: rapid rate, no MRG  Respiratory: CTA BL, no wheezes  Abdomen: increased pain today, nondistended, hypoactive bowel sounds   Musculoskeletal: no cyanosis or clubbing   Data Reviewed: Basic Metabolic Panel:  Recent Labs Lab 05/08/13 0350 05/09/13 0515 05/10/13 0515 05/12/13 0445 05/14/13 0448  NA  --  135* 132* 134* 134*  K  --  3.5* 3.2* 3.4* 3.4*  CL  --  101 97 101 99  CO2  --  23 22 25 26   GLUCOSE  --  108* 107* 121* 111*  BUN  --  4* 5* 6 5*  CREATININE  --  0.57 0.53 0.55 0.48*  CALCIUM  --  8.3* 8.3* 8.3* 8.0*  PHOS 2.1* 2.5  --   --   --    Liver Function Tests:  Recent Labs Lab 05/09/13 0515 05/14/13 0448  AST 18 17  ALT 16 13  ALKPHOS 589* 588*  BILITOT 2.9* 2.6*  PROT 5.5* 5.4*  ALBUMIN 1.7* 1.6*   No results found for this basename: LIPASE, AMYLASE,  in the last 168 hours No results found for this basename: AMMONIA,  in the last 168 hours CBC:  Recent Labs Lab 05/09/13 0515 05/10/13 0515 05/12/13 0445 05/14/13 0448  WBC 6.1 8.0 7.3 7.3  HGB 9.0* 9.5* 9.2* 9.1*  HCT 27.9* 29.4* 28.8* 28.6*  MCV 90.6 90.5 90.9 91.1  PLT 281 290 304 294   Cardiac Enzymes: No results found for this basename: CKTOTAL, CKMB, CKMBINDEX, TROPONINI,  in the last 168 hours BNP (last 3 results) No results found for this basename: PROBNP,  in the last 8760 hours CBG: No results found for this basename: GLUCAP,  in the last 168 hours  Recent Results (from the past 240 hour(s))  CULTURE, BLOOD (ROUTINE X 2)     Status: None   Collection Time    05/04/13  8:41 PM      Result Value Ref Range Status   Specimen Description BLOOD LEFT ANTECUBITAL   Final   Special Requests BOTTLES DRAWN AEROBIC AND  ANAEROBIC 8ML   Final   Culture  Setup Time     Final   Value: 05/05/2013 01:09     Performed at [facility]   Culture     Final   Value: SERRATIA MARCESCENS     Note: SUSCEPTIBILITIES PERFORMED ON PREVIOUS CULTURE WITHIN THE LAST 5 DAYS.     Note: Gram Stain Report Called to,Read Back By and Verified With: Angi HUFF 05/05/13 @ 6:28PM BY RUSCOE A.     Performed at [facility]   Report Status 05/07/2013 FINAL   Final  CULTURE, BLOOD (ROUTINE X 2)     Status: None   Collection Time    05/04/13  9:40 PM      Result Value Ref Range Status   Specimen Description BLOOD RIGHT PORTA CATH   Final   Special Requests BOTTLES DRAWN AEROBIC AND ANAEROBIC 5CC   Final   Culture  Setup Time     Final   Value: 05/05/2013 01:09     Performed at [facility]   Culture     Final   Value: SERRATIA MARCESCENS     Note: Gram Stain Report Called to,Read Back By and Verified With: [name] 05/05/13 @ 6:28PM BY RUSCOE A.     Performed at [facility]   Report Status 05/07/2013 FINAL   Final   Organism ID, Bacteria SERRATIA MARCESCENS   Final  CULTURE, BLOOD (ROUTINE X 2)     Status: None   Collection Time    05/07/13  4:25 PM      Result Value Ref Range Status   Specimen Description BLOOD RIGHT ARM   Final   Special Requests BOTTLES DRAWN AEROBIC AND ANAEROBIC 10CC EACH   Final   Culture  Setup Time     Final   Value: 05/07/2013 20:56     Performed at [facility]   Culture     Final   Value: NO GROWTH 5 DAYS     Performed at [facility]   Report Status 05/13/2013 FINAL   Final  CULTURE, BLOOD (ROUTINE X 2)     Status: None   Collection Time    05/07/13  4:36 PM      Result Value Ref Range Status   Specimen Description BLOOD LEFT ARM   Final   Special Requests BOTTLES DRAWN AEROBIC AND ANAEROBIC [facility] EACH   Final   Culture  Setup Time     Final   Value: 05/08/2013 13:28     Performed at [facility]   Culture     Final   Value: NO GROWTH 5  DAYS     Performed at [facility]   Report Status 05/14/2013  FINAL   Final  CLOSTRIDIUM DIFFICILE BY PCR     Status: None   Collection Time    05/08/13  5:12 AM      Result Value Ref Range Status   C difficile by pcr NEGATIVE  NEGATIVE Final   Comment: Performed at [facility]     Status: None   Collection Time    05/08/13  2:20 PM      Result Value Ref Range Status   Specimen Description LIVER BILE FROM BILIARY DRAINS LEFT   Final   Special Requests Normal   Final   Gram Stain     Final   Value: NO WBC SEEN     NO ORGANISMS SEEN     Performed at [facility]   Culture     Final   Value: FEW ENTEROBACTER CLOACAE     Note: CRITICAL RESULT CALLED TO, READ BACK BY AND VERIFIED WITH: BESS B@10 :13AM ON 05/09/13 BY DANTS     FEW CANDIDA LUSITANIAE     Performed at [facility]   Report Status 05/13/2013 FINAL   Final   Organism ID, Bacteria ENTEROBACTER CLOACAE   Final  BODY FLUID CULTURE     Status: None   Collection Time    05/08/13  2:40 PM      Result Value Ref Range Status   Specimen Description LIVER Bile from biliary drain RIGHT   Final   Special Requests NONE   Final   Gram Stain     Final   Value: NO WBC SEEN     NO ORGANISMS SEEN     Performed at [facility]   Culture     Final   Value: FEW ENTEROBACTER CLOACAE     Note: SUSCEPTIBILITIES PERFORMED ON PREVIOUS CULTURE WITHIN THE LAST 5 DAYS. CRITICAL RESULT CALLED TO, READ BACK BY AND VERIFIED WITH: BESS B@10 :13AM ON 05/09/13 BY DANTS     FEW CANDIDA LUSITANIAE     Performed at [facility]   Report Status 05/13/2013 FINAL   Final     Studies: No results found.  Scheduled Meds: . ceFEPime (MAXIPIME) IV  1 g Intravenous Q12H  . citalopram  20 mg Oral Daily  . diltiazem  180 mg Oral Daily  . docusate sodium  100 mg Oral BID  . fentaNYL  200 mcg Transdermal Q72H  . loratadine  10 mg Oral Daily  . metoprolol tartrate  50 mg Oral BID  . micafungin  (MYCAMINE) IV  100 mg Intravenous Q24H  . pantoprazole  40 mg Oral Daily  . potassium chloride  10 mEq Oral BID  . Rivaroxaban  20 mg Oral QPM  . sodium chloride  3 mL Intravenous Q12H   Continuous Infusions: . sodium chloride 100 mL/hr at 05/14/13 0950     Time spent: 35 minutes    [facility] Pager [phone]  If 7PM-7AM, please contact night-coverage at [email or web address], password [facility] 05/14/2013, 2:51 PM  LOS: 10 days

## 2013-05-14 NOTE — Progress Notes (Signed)
Flushed mid abd & Rt flank biliary drains with 10cc/NS flush as per MD orders per radiology

## 2013-05-15 MED ORDER — FLUCONAZOLE 200 MG PO TABS: 400.0000 mg | ORAL_TABLET | Freq: Every day | ORAL | Status: AC

## 2013-05-15 MED ORDER — HYDROMORPHONE HCL 4 MG PO TABS: 4.0000 mg | ORAL_TABLET | ORAL | Status: DC | PRN

## 2013-05-15 MED ORDER — METOPROLOL TARTRATE 50 MG PO TABS
50.0000 mg | ORAL_TABLET | Freq: Two times a day (BID) | ORAL | Status: AC
Start: 2013-05-15 — End: ?

## 2013-05-15 MED ORDER — CIPROFLOXACIN HCL 500 MG PO TABS: 500.0000 mg | ORAL_TABLET | Freq: Two times a day (BID) | ORAL | Status: AC

## 2013-05-15 MED ORDER — HEPARIN SOD (PORK) LOCK FLUSH 100 UNIT/ML IV SOLN
500.0000 [IU] | INTRAVENOUS | Status: AC | PRN
  Administered 2013-05-15: 500 [IU]

## 2013-05-15 NOTE — Progress Notes (Addendum)
Subjective: Pt without acute changes; still has intermittent right abd discomfort, occ leaking around rt sided biliary drain; states she may go home today  Objective: Vital signs in last 24 hours: Temp:  [98.3 F (36.8 C)-98.6 F (37 C)] 98.4 F (36.9 C) (03/17 0500) Pulse Rate:  [89-125] 104 (03/17 0500) Resp:  [16-20] 16 (03/17 0500) BP: (121-135)/(75-93) 128/88 mmHg (03/17 0500) SpO2:  [96 %-97 %] 96 % (03/17 0500) Weight:  [166 lb 14.4 oz (75.705 kg)] 166 lb 14.4 oz (75.705 kg) (03/17 0433) Last BM Date: 05/14/13  Intake/Output from previous day: 03/16 0701 - 03/17 0700 In: 2670 [P.O.:240; I.V.:2410] Out: 3010 [Urine:2250; Drains:760] Intake/Output this shift: Total I/O In: -  Out: 935 [Urine:800; Drains:135]  Both L/R biliary drains intact, outputs 10/125 cc's respectively; both drains were irrigated with 10 cc's sterile NS without difficulty and return of green bile  Lab Results:   Recent Labs  05/14/13 0448  WBC 7.3  HGB 9.1*  HCT 28.6*  PLT 294   BMET  Recent Labs  05/14/13 0448  NA 134*  K 3.4*  CL 99  CO2 26  GLUCOSE 111*  BUN 5*  CREATININE 0.48*  CALCIUM 8.0*   PT/INR No results found for this basename: LABPROT, INR,  in the last 72 hours ABG No results found for this basename: PHART, PCO2, PO2, HCO3,  in the last 72 hours  Studies/Results: No results found.  Anti-infectives: Anti-infectives   Start     Dose/Rate Route Frequency Ordered Stop   05/15/13 0000  ciprofloxacin (CIPRO) 500 MG tablet     500 mg Oral 2 times daily 05/15/13 1125 05/22/13 2359   05/15/13 0000  fluconazole (DIFLUCAN) 200 MG tablet     400 mg Oral Daily 05/15/13 1125 05/22/13 2359   05/14/13 2000  ciprofloxacin (CIPRO) tablet 500 mg     500 mg Oral 2 times daily 05/14/13 1457     05/14/13 1600  fluconazole (DIFLUCAN) tablet 400 mg     400 mg Oral Daily 05/14/13 1457     05/10/13 1800  micafungin (MYCAMINE) 100 mg in sodium chloride 0.9 % 100 mL IVPB  Status:   Discontinued     100 mg 100 mL/hr over 1 Hours Intravenous Every 24 hours 05/10/13 1533 05/14/13 1457   05/07/13 1600  ceFEPIme (MAXIPIME) 1 g in dextrose 5 % 50 mL IVPB  Status:  Discontinued     1 g 100 mL/hr over 30 Minutes Intravenous Every 12 hours 05/07/13 1534 05/14/13 1457   05/05/13 1000  vancomycin (VANCOCIN) IVPB 1000 mg/200 mL premix  Status:  Discontinued     1,000 mg 200 mL/hr over 60 Minutes Intravenous Every 12 hours 05/05/13 0344 05/07/13 1534   05/05/13 0000  vancomycin (VANCOCIN) IVPB 1000 mg/200 mL premix     1,000 mg 200 mL/hr over 60 Minutes Intravenous  Once 05/04/13 2355 05/05/13 0124   05/04/13 2345  piperacillin-tazobactam (ZOSYN) IVPB 3.375 g  Status:  Discontinued     3.375 g 12.5 mL/hr over 240 Minutes Intravenous 3 times per day 05/04/13 2336 05/07/13 1534      Assessment/Plan: s/p bilateral biliary drain placements 2/27 for obstructive jaundice secondary to metastatic cervical cancer. (R) biliary drain exchanged and upsized to 12Fr 3/11; cont current tx/drain irrigation once daily as outpt with daily dressing changes;given progression of hilar predominant hepatic metastatic disease,  this patient is not a candidate for internal biliary stent placement  and thus routine biliary exchanges should be performed  on a q 6 week  basis.    LOS: 11 days    Jorell Agne,D [facility] 05/15/2013

## 2013-05-15 NOTE — Discharge Summary (Signed)
Physician Discharge Summary  [name] BSJ:[id number] DOB: [birth year] DOA: 05/04/2013  PCP: Gwendolyn Grant, MD  Admit date: 05/04/2013 Discharge date: 05/15/2013  Time spent: 45 minutes  Recommendations for Outpatient Follow-up:  -Will be discharged home today. -Advised to follow up with PCP in 2 weeks and with Dr. Humphrey Rolls as scheduled.   Discharge Diagnoses:  Active Problems:   Atrial fibrillation   Cervical cancer   Anemia of chronic disease   Hyponatremia   Protein-calorie malnutrition, severe   Abdominal pain, unspecified site   Malignant obstructive jaundice   Ascending cholangitis   Cholangitis   Sepsis   History of DVT (deep vein thrombosis)   Gram-negative bacteremia   Hypophosphatemia   Hypokalemia   Discharge Condition: Stable and improved  Filed Weights   05/04/13 2342 05/14/13 0533 05/15/13 0433  Weight: 71.578 kg (157 lb 12.8 oz) 74.662 kg (164 lb 9.6 oz) 75.705 kg (166 lb 14.4 oz)    History of present illness:  [name] is a 63 y.o. female  has a past medical history of Diastolic dysfunction; MIGRAINE HEADACHE; Atrial fibrillation; Anemia; HTN (hypertension); GLAUCOMA; CARPAL TUNNEL SYNDROME, RIGHT; Lymphedema; Hepatic steatosis; DVT (deep venous thrombosis) (10/2010); CHF (congestive heart failure); Arthritis; GERD (gastroesophageal reflux disease); Small kidney; Radiation (10/11/11-11/19/11); Radiation (03/16/10-03/25/10); Radiation (06/11/08-07/23/08); Hyperlipidemia; Leg swelling; Cervical cancer (dx 04/2003, recur 04/2008); and Osteoporosis with fracture (2014).  Presented with  Patient was diagnosed with cervical cancer since 2005 sp total Hysterectomy with recurrence in 2010 sp CXR and radiation treatment with metastatic spread to the liver since 2012 recently with biliary obstruction sp stenting with drain placement by Dr Kathlene Cote. Done 04/09/13 left side and 2/27 on the right. Since the last procedure patient have had some low grade fevers for the past 3-4  days but today her fever spiked to 102.6. Her abdominal pain has not changed does not endorse worsening output from her drains. Family states that there is increase in drainage around the tubes with some redness of the skin. Denies any nausea vomittng, endorses some diarrhea x 2 BM's, no sick contacts. Hospitalist calleld for admission with IR consult in AM for presumed cholangitis.    Hospital Course:   Sepsis/ gram negative bacteremia /Ascending Cholangitis -Initial BC with serratia; repeat 3/9 remain negative.  -Biliary drain cx with eneterobacter and candida.  -Per ID, will do Cipro/fluconazole on DC until 3/24.  -Sepsis parameters resolved. -Has 2 biliary drains that cannot be internalized, per IR due to hepatic metastatic disease. -Will needs biliary drain exchanges every 6 weeks.  Atrial fibrillation with RVR  - Rate controlled at present. -DC on metoprolol and cardizem for rate control. -Is on xarelto for anticoagulation.  Cervical cancer  - Pt to follow up with oncologist on discharge   Anemia of chronic disease  - Hemoglobin currently stable   Hyponatremia  - Most likely due to poor oral solute intake. -Resolved.  Protein-calorie malnutrition, severe  - Currently on regular diet and consuming most of her meals.  History of DVT (deep vein thrombosis)  - Patient is currently on xarelto    Consultations:  ID  IR  Discharge Instructions  Discharge Orders   Future Appointments Provider Department Dept Phone   05/22/2013 11:00 AM Carlyle Basques, MD [facility] [phone]   06/08/2013 12:30 PM [facility] [place] [phone]   06/08/2013 2:30 PM [facility] [phone]   Future Orders Complete By Expires  Diet - low sodium heart healthy  As directed    Discontinue IV  As directed    Increase activity slowly  As directed        Medication List          bisacodyl 5 MG EC tablet  Commonly known as:  DULCOLAX  Take 5 mg by mouth daily as needed for constipation.     CARNATION INSTANT BREAKFAST PO  Take 1 Can by mouth 4 (four) times daily as needed (for meals).     cetirizine 10 MG tablet  Commonly known as:  ZYRTEC  Take 10 mg by mouth daily as needed for allergies.     ciprofloxacin 500 MG tablet  Commonly known as:  CIPRO  Take 1 tablet (500 mg total) by mouth 2 (two) times daily. Take until 3/24.     citalopram 20 MG tablet  Commonly known as:  CELEXA  Take 20 mg by mouth 2 (two) times daily.     diltiazem 180 MG 24 hr capsule  Commonly known as:  CARDIZEM CD  Take 1 capsule (180 mg total) by mouth daily. TAke 2-3 hours before of after Metoprolol     docusate sodium 100 MG capsule  Commonly known as:  COLACE  Take 1 capsule (100 mg total) by mouth 2 (two) times daily.     fentaNYL 100 MCG/HR  Commonly known as:  DURAGESIC - dosed mcg/hr  Apply 2 patches every 72 hours (total of 200 mcg)     ferrous fumarate 325 (106 FE) MG Tabs tablet  Commonly known as:  HEMOCYTE - 106 mg FE  Take 1 tablet (106 mg of iron total) by mouth daily.     fluconazole 200 MG tablet  Commonly known as:  DIFLUCAN  Take 2 tablets (400 mg total) by mouth daily. Take until 3/24.     folic acid 1 MG tablet  Commonly known as:  FOLVITE  Take 1 tablet (1 mg total) by mouth every morning.     HYDROmorphone 4 MG tablet  Commonly known as:  DILAUDID  Take 1 tablet (4 mg total) by mouth every 2 (two) hours as needed for severe pain.     ibuprofen 200 MG tablet  Commonly known as:  ADVIL,MOTRIN  Take 600 mg by mouth every 4 (four) hours as needed. Pain     lidocaine-prilocaine cream  Commonly known as:  EMLA  Apply 1 application topically daily as needed (for port access).     LORazepam 1 MG tablet  Commonly known as:  ATIVAN  Take 1 tablet (1 mg total) by mouth every 6 (six) hours as needed (for nausea (may put under tongue)).      metoprolol 50 MG tablet  Commonly known as:  LOPRESSOR  Take 1 tablet (50 mg total) by mouth 2 (two) times daily.     ondansetron 8 MG tablet  Commonly known as:  ZOFRAN  Take 1 tablet (8 mg total) by mouth every 8 (eight) hours as needed for nausea.     pantoprazole 40 MG tablet  Commonly known as:  PROTONIX  Take 1 tablet (40 mg total) by mouth daily.     potassium chloride 8 MEQ tablet  Commonly known as:  KLOR-CON  Take 8 mEq by mouth 2 (two) times daily.     Rivaroxaban 20 MG Tabs tablet  Commonly known as:  XARELTO  Take 1 tablet (20 mg total) by mouth every evening.     sodium chloride  0.65 % nasal spray  Commonly known as:  OCEAN  Place 1 spray into the nose daily as needed for congestion. Allergies     triamcinolone 55 MCG/ACT nasal inhaler  Commonly known as:  NASACORT  Place 2 sprays into the nose daily as needed (allergies or congestion).     zolpidem 5 MG tablet  Commonly known as:  AMBIEN  Take 1 tablet (5 mg total) by mouth at bedtime as needed for sleep.       Allergies  Allergen Reactions  . Codeine Other (See Comments)    Breathing issues  . Flecainide Acetate Other (See Comments)    Leg cramping   . Morphine Other (See Comments)     Hallucinations  . Propafenone Hcl Other (See Comments)    Leg cramps       Follow-up Information   Follow up with Gwendolyn Grant, MD. Schedule an appointment as soon as possible for a visit in 2 weeks.   Specialty:  Internal Medicine   Contact information:   [address] [place] [address] [phone]       Follow up with Carlyle Basques, MD. (as scheduled by her office)    Specialty:  Infectious Diseases   Contact information:   [place] [address] [phone]       Follow up with Marcy Panning, MD. (Call for appointment )    Specialty:  Oncology   Contact information:   [place] [address] [phone]         The results of significant diagnostics from this hospitalization (including imaging, microbiology, ancillary and laboratory) are listed below for reference.    Significant Diagnostic Studies: Dg Chest 2 View  05/04/2013   CLINICAL DATA:  Fever for the past several days.  Jaundice.  EXAM: CHEST  2 VIEW  COMPARISON:  Chest x-ray 07/29/2012.  FINDINGS: Right internal jugular single-lumen power porta cath with tip terminating in the distal superior vena cava. Lung volumes are low. Linear opacity in the left mid lung is unchanged, compatible with an area of scarring. Linear bibasilar opacities may reflect additional areas of mild scarring or subsegmental atelectasis. No consolidative airspace disease. No definite pleural effusions. No evidence of pulmonary edema. Heart size is normal. Mediastinal contours are unremarkable. Atherosclerosis in the thoracic aorta. 2 percutaneous biliary drains are seen projecting over the upper abdomen. Numerous surgical clips are present in the right upper quadrant, compatible with prior cholecystectomy. Post vertebroplasty changes are noted in the upper lumbar spine.  IMPRESSION: 1. No radiographic evidence of acute cardiopulmonary disease. The appearance the chest is similar to prior studies, as above.   Electronically Signed   By: Vinnie Langton M.D.   On: 05/04/2013 21:25   Ct Abdomen Pelvis W Contrast  05/05/2013   CLINICAL DATA:  Evaluate for intra-abdominal abscess  EXAM: CT ABDOMEN AND PELVIS WITH CONTRAST  TECHNIQUE: Multidetector CT imaging of the abdomen and pelvis was performed using the standard protocol following bolus administration of intravenous contrast.  CONTRAST:  33m OMNIPAQUE IOHEXOL 300 MG/ML SOLN, 1082mOMNIPAQUE IOHEXOL 300 MG/ML SOLN  COMPARISON:  04/05/2013  FINDINGS: BODY WALL: Venous collaterals on the left lower abdomen and upper thigh, chronically occluded left external iliac vein.  LOWER CHEST: Cardiomegaly.  Bibasilar atelectasis.   ABDOMEN/PELVIS:  Liver: Diffuse hepatic metastatic disease which shows no interval change from prior. The largest mass is in the central liver, measuring up  to 7 cm. No new hepatic abnormality to suggest liver abscess. There are attenuation differences around multiple peripheral locations in the liver, including right-sided biliary drain, likely reactive. No evidence of acute vascular occlusion.  Improved biliary ductal dilatation status post bilateral biliary drain placement. Both catheters reached the duodenum. No undrained segments suspected.  Pancreas: Unremarkable.  Spleen: Unchanged mild enlargement  Adrenals: Unremarkable.  Kidneys and ureters: Unchanged atrophic, nonenhancing left kidney. Appearance consistent with long-standing vascular compromise, likely related to mass circumferentially and narrowing the left renal artery.  Bladder: Unremarkable.  Reproductive: Radical hysterectomy and bilateral salpingectomy and oophorectomies.  Bowel: No obstruction. Appendicolith without appendicitis.  Retroperitoneum: Retroperitoneal nodal metastatic disease is unchanged from prior. Index right crural node continues to measure 12 mm short axis. Cystic left node of [place], unchanged at 20 x 34 mm.  Peritoneum: Small volume of free fluid, mildly increased in the pelvic recesses. No loculated collection.  Vascular: IVC filter. 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  OSSEOUS: L1, L2, L3, and L4 superior endplate insufficiency fractures status post bone augmentation at L1 and L3. No acute fracture identified. No evidence of osseous infection.  IMPRESSION: 1. No intra-abdominal abscess or other definitive source of intra-abdominal infection. 2. Decreased biliary dilatation after bilateral biliary drain placement. 3. Intra-abdominal metastatic disease, recently staged (04/05/2013).   Electronically Signed   By: Jorje Guild M.D.   On: 05/05/2013 02:05   Ir Cholan Exist Tube  04/23/2013   CLINICAL DATA:   Metastatic cervical cancer with liver lesions and biliary obstruction. Patient has an internal-external left biliary drain.  EXAM: CHOLANGIOGRAM VIA EXISTING CATHETER  Physician: Stephan Minister. Henn, MD  MEDICATIONS: None  ANESTHESIA/SEDATION: Moderate sedation time: None  FLUOROSCOPY TIME:  48 seconds  PROCEDURE: Patient was placed supine on the interventional table. The existing left biliary drain was injected with contrast under fluoroscopy. The drain was flushed with normal saline at the end of the procedure and attached to a new gravity bag.  COMPLICATIONS: None  FINDINGS: Stable position of the left internal-external biliary drain. Injection of contrast demonstrates filling of mildly dilated left intrahepatic bile ducts. Contrast drains around the catheter into the small bowel. There is no filling of the right intrahepatic ducts. Patient has surgical clips in the right upper quadrant from a cholecystectomy. Patient has a TrapEase IVC filter and vertebral augmentation at L1.  IMPRESSION: The biliary drain is only draining the left hepatic lobe. There is no filling of the right bile ducts. These findings explain why the patient's total bilirubin remains elevated. These findings were discussed with the patient.  PLAN: The patient will be scheduled for a right biliary tube placement with ultrasound and fluoroscopy. Patient will be scheduled later in the week because she is still on Xarelto. Plan to keep the patient overnight for observation after the procedure.   Electronically Signed   By: Markus Daft M.D.   On: 04/23/2013 12:36   Ir Biliary Dilitation  04/27/2013   INDICATION: History of metastatic cervical cancer, now with obstructive jaundice due to hilar metastases. Post left-sided ultrasound and fluoroscopic guided percutaneous biliary drainage catheter placement on 04/09/2013, though patient's bilirubin has remained elevated post drain placement. Subsequent biliary catheter check performed 04/23/2013  demonstrates failure of communication between the left and right biliary trees. As such, the request has been made for placement of an additional right-sided biliary drainage catheter.  EXAM: 1. ULTRASOUND AND FLUOROSCOPIC GUIDED PERCUTANEOUS TRANSHEPATIC CHOLANGIOGRAM AND BILIARY TUBE PLACEMENT 2. FLUOROSCOPIC GUIDED EXCHANGE  OF LEFT-SIDED PERCUTANEOUS BILIARY DRAINAGE CATHETER  MEDICATIONS: Versed said 6 mg IV; fentanyl 300 mcg IV; Zosyn 3.375 mg IV  TECHNIQUE: Informed written consent was obtained from the patient after a discussion of the risks, benefits and alternatives to treatment. Questions regarding the procedure were encouraged and answered. A timeout was performed prior to the initiation of the procedure.  The right upper abdominal quadrant and external portion of the existing left biliary drainage catheter was prepped and draped in the usual sterile fashion, and a sterile drape was applied covering the operative field. Maximum barrier sterile technique with sterile gowns and gloves were used for the procedure. A timeout was performed prior to the initiation of the procedure.  Limited contrast injection was performed of the existing left-sided biliary drainage catheter however again failed to delineate communication within the right biliary tree.  Ultrasound scanning of the right upper abdominal quadrant was performed to delineate the anatomy and avoid transgression of the gallbladder or the pleural. A spot along the right mid axillary line was marked fluoroscopically inferior to the right costophrenic angle.  After the overlying soft tissues were anesthetized with 1% Lidocaine with epinephrine, a 22 gauge Chiba needle was utilized a moderately dilated biliary duct within the anterior inferior segment of the right lobe of the liver. Intra biliary puncture was confirmed with the injection of a small amount of contrast. The duct was cannulated with a Nitrex wire and the tract was dilated with an Accustick  set.  The Nitrex wire was advanced to the level of the duodenum. A 4 French angled glide catheter was cut and utilized to exchange the Nitrex wire for an Amplatz stiff guidewire which was coiled within the proximal duodenum. The Accustick and 4 French angled glide catheter were exchanged for a Kumpe catheter. Contrast injection confirmed appropriate positioning.  Over an Amplatz stiff wire, the tract was dilated and a 10.2 [place] biliary drainage catheter was advanced with coil ultimately locked within the duodenum. Note, approximately 2 cm of sideholes were made peripheral to the radiopaque marker. Contrast was injected and a completion radiograph was obtained. The catheter was connected to a drainage bag which yielded the brisk return of clear bile. The catheter was secured to the skin with an interrupted suture.  The existing left biliary drainage catheter was cut and cannulated with an Amplatz wire which was coiled within the proximal duodenum. Under intermittent fluoroscopic guidance, the existing 10 French percutaneous drainage catheter was exchanged for a new 10 French percutaneous drainage catheter with end ultimately coiled and locked within the duodenum. Note, approximately 2 cm of sideholes were made peripheral to the radiopaque marker. Limited contrast injection was performed in a postprocedural spot radiograph was obtained.  The patient tolerated the procedure well without immediate postprocedural complication.  ANESTHESIA/SEDATION: Total Moderate Sedation Time  50 minutes  CONTRAST:  50mL OMNIPAQUE IOHEXOL 300 MG/ML  SOLN  COMPARISON:  CT ABD/PELVIS W CM dated 04/05/2013  FLUOROSCOPY TIME:  6 minutes; 42 seconds.  COMPLICATIONS: Patient developed minimal postprocedural rigors which responded to conservative management including IV fluids, Demerol and Tylenol. Patient remained hemodynamically stable.  FINDINGS: Limited contrast injection of the existing left-sided biliary drainage catheter again failed to  delineate communication between the left and right biliary trees.  Successful ultrasound and fluoroscopic guided placement of a 10 French percutaneous biliary drainage catheter via a moderately dilated peripheral duct within the anterior inferior aspect of the right lobe of the liver with an ultimately coiled and locked within the  proximal duodenum. Note, approximately 2 cm of additional sideholes were caught peripheral to the radiopaque marker.  Successful fluoroscopic guided exchange of existing left-sided biliary approach percutaneous biliary drainage catheter with and coiled and locked within the proximal duodenum. Note, approximately 2 cm of additional sideholes were cut peripheral to the radiopaque marker.  Limited contrast injection demonstrates an irregular mass within the confluence of the biliary hilum compatible with the known metastatic disease demonstrated on prior abdominal CT.  IMPRESSION: 1. Successful placement of a new 10.2 French percutaneous right-sided biliary drainage catheter with end coiled and locked within the duodenum. Note, approximately 2 cm of additional sideholes were cut peripheral to the radiopaque marker. 2. Successful fluoroscopic guided exchange of existing left-sided 10 French percutaneous biliary drainage catheter with end coiled and locked within the duodenum. Note, approximately 2 cm of additional sideholes were cut peripheral to the radiopaque marker.  PLAN: Patient will return for formal cholangiographic images in approximately 4-6 weeks at which time consideration for potential internal biliary stent placement may be considered. Note, given appearance of the hilar mass on limited cholangiogram performed today, I feel it is unlikely this patient will be a candidate for internal stent placement.   Electronically Signed   By: Sandi Mariscal M.D.   On: 04/27/2013 17:06   Ir Catheter Tube Change  05/09/2013   INDICATION History of cervical cancer with metastatic disease to the  liver and now with malignant obstructive jaundice. Post left (04/09/2013) and right-sided (04/27/2013) percutaneous biliary drainage catheter, with improvement and serum bilirubin though there has been persistent drainage around the recently placed a right-sided biliary drainage catheter.  EXAM FLUOROSCOPIC GUIDED RIGHT SIDED BILIARY DRAINAGE CATHETER EXCHANGE AND UPSIZING  COMPARISON IR CATHETER TUBE CHANGE dated 04/27/2013; CT ABD/PELVIS W CM dated 05/05/2013; IR CHOLAN EXIST TUBE dated 04/23/2013; IR PTC dated 04/09/2013; CT ABD/PELVIS W CM dated 04/05/2013  CONTRAST 20 mL Isovue-300 administered into the biliary system.  FLUOROSCOPY TIME 3 minutes 30 seconds  COMPLICATIONS None immediate  TECHNIQUE Informed written consent was obtained from the patient after a discussion of the risks, benefits and alternatives to treatment. Questions regarding the procedure were encouraged and answered. A timeout was performed prior to the initiation of the procedure.  The right upper abdominal quadrant and external portion of existing right-sided biliary drainage catheter were prepped and draped in the usual sterile fashion. A sterile drape was applied covering the operative field. Maximum barrier sterile technique with sterile gowns and gloves were used for the procedure. A timeout was performed prior to the initiation of the procedure.  A preprocedural spot fluoroscopic image was obtained. Multiple spot fluoroscopic and radiographic images were obtained after the injection of a small amount of contrast via the existing right-sided biliary drainage catheter.  The existing biliary drainage catheter was cut and cannulated with a stiff Glidewire which was coiled within the proximal aspect of the duodenum. . Under intermittent fluoroscopic guidance, the existing biliary drainage catheter was exchanged for a new, slightly larger 12 [place] all-purpose drainage catheter. Note, approximately 6 cm of additional sideholes were cut peripheral to  the radiopaque marker.  A minimal amount of contrast was injected and a post exchange radiograph was obtained. The catheter was locked and secured to the skin with a suture. A dressing was placed. The patient tolerated the procedure well without immediate postprocedural complication.  FINDINGS The existing right-sided biliary drainage catheter is appropriately positioned and functioning with passage of contrast through the distal end of the catheter and into  the proximal duodenum. A very minimal amount of contrast was noted to reflux along the external portion of the biliary drainage catheter about the hepatic capsule.  Grossly unchanged positioning of existing left-sided biliary drainage catheter. Dedicated left-sided biliary drainage injection was not performed as the patient is not currently complaining drainage around the left-sided biliary drainage catheter.  After successful fluoroscopic exchange, a new, slightly larger, now 75 French percutaneous biliary drainage catheter is positioned with end coiled and locked within the proximal duodenum. Note, approximately 6 cm of additional sideholes were cut peripheral to the radiopaque marker.  IMPRESSION Successful fluoroscopic guided exchange and up sizing of right sided, now 6 French percutaneous nephrostomy catheter with approximately 6 cm of additional sideholes cut peripheral to the radiopaque marker for persistent peri-catheter leakage.  PLAN Given progression of hilar predominant hepatic metastatic disease, this patient is not a candidate for internal biliary stent placement and thus routine biliary exchanges should be performed on a q 6 week basis.  SIGNATURE  Electronically Signed   By: Sandi Mariscal M.D.   On: 05/09/2013 12:03   Ir Catheter Tube Change  04/27/2013   INDICATION: History of metastatic cervical cancer, now with obstructive jaundice due to hilar metastases. Post left-sided ultrasound and fluoroscopic guided percutaneous biliary drainage  catheter placement on 04/09/2013, though patient's bilirubin has remained elevated post drain placement. Subsequent biliary catheter check performed 04/23/2013 demonstrates failure of communication between the left and right biliary trees. As such, the request has been made for placement of an additional right-sided biliary drainage catheter.  EXAM: 1. ULTRASOUND AND FLUOROSCOPIC GUIDED PERCUTANEOUS TRANSHEPATIC CHOLANGIOGRAM AND BILIARY TUBE PLACEMENT 2. FLUOROSCOPIC GUIDED EXCHANGE OF LEFT-SIDED PERCUTANEOUS BILIARY DRAINAGE CATHETER  MEDICATIONS: Versed said 6 mg IV; fentanyl 300 mcg IV; Zosyn 3.375 mg IV  TECHNIQUE: Informed written consent was obtained from the patient after a discussion of the risks, benefits and alternatives to treatment. Questions regarding the procedure were encouraged and answered. A timeout was performed prior to the initiation of the procedure.  The right upper abdominal quadrant and external portion of the existing left biliary drainage catheter was prepped and draped in the usual sterile fashion, and a sterile drape was applied covering the operative field. Maximum barrier sterile technique with sterile gowns and gloves were used for the procedure. A timeout was performed prior to the initiation of the procedure.  Limited contrast injection was performed of the existing left-sided biliary drainage catheter however again failed to delineate communication within the right biliary tree.  Ultrasound scanning of the right upper abdominal quadrant was performed to delineate the anatomy and avoid transgression of the gallbladder or the pleural. A spot along the right mid axillary line was marked fluoroscopically inferior to the right costophrenic angle.  After the overlying soft tissues were anesthetized with 1% Lidocaine with epinephrine, a 22 gauge Chiba needle was utilized a moderately dilated biliary duct within the anterior inferior segment of the right lobe of the liver. Intra biliary  puncture was confirmed with the injection of a small amount of contrast. The duct was cannulated with a Nitrex wire and the tract was dilated with an Accustick set.  The Nitrex wire was advanced to the level of the duodenum. A 4 French angled glide catheter was cut and utilized to exchange the Nitrex wire for an Amplatz stiff guidewire which was coiled within the proximal duodenum. The Accustick and 4 French angled glide catheter were exchanged for a Kumpe catheter. Contrast injection confirmed appropriate positioning.  Over an  Amplatz stiff wire, the tract was dilated and a 10.2 [place] biliary drainage catheter was advanced with coil ultimately locked within the duodenum. Note, approximately 2 cm of sideholes were made peripheral to the radiopaque marker. Contrast was injected and a completion radiograph was obtained. The catheter was connected to a drainage bag which yielded the brisk return of clear bile. The catheter was secured to the skin with an interrupted suture.  The existing left biliary drainage catheter was cut and cannulated with an Amplatz wire which was coiled within the proximal duodenum. Under intermittent fluoroscopic guidance, the existing 10 French percutaneous drainage catheter was exchanged for a new 10 French percutaneous drainage catheter with end ultimately coiled and locked within the duodenum. Note, approximately 2 cm of sideholes were made peripheral to the radiopaque marker. Limited contrast injection was performed in a postprocedural spot radiograph was obtained.  The patient tolerated the procedure well without immediate postprocedural complication.  ANESTHESIA/SEDATION: Total Moderate Sedation Time  50 minutes  CONTRAST:  38mL OMNIPAQUE IOHEXOL 300 MG/ML  SOLN  COMPARISON:  CT ABD/PELVIS W CM dated 04/05/2013  FLUOROSCOPY TIME:  6 minutes; 42 seconds.  COMPLICATIONS: Patient developed minimal postprocedural rigors which responded to conservative management including IV fluids, Demerol  and Tylenol. Patient remained hemodynamically stable.  FINDINGS: Limited contrast injection of the existing left-sided biliary drainage catheter again failed to delineate communication between the left and right biliary trees.  Successful ultrasound and fluoroscopic guided placement of a 10 French percutaneous biliary drainage catheter via a moderately dilated peripheral duct within the anterior inferior aspect of the right lobe of the liver with an ultimately coiled and locked within the proximal duodenum. Note, approximately 2 cm of additional sideholes were caught peripheral to the radiopaque marker.  Successful fluoroscopic guided exchange of existing left-sided biliary approach percutaneous biliary drainage catheter with and coiled and locked within the proximal duodenum. Note, approximately 2 cm of additional sideholes were cut peripheral to the radiopaque marker.  Limited contrast injection demonstrates an irregular mass within the confluence of the biliary hilum compatible with the known metastatic disease demonstrated on prior abdominal CT.  IMPRESSION: 1. Successful placement of a new 10.2 French percutaneous right-sided biliary drainage catheter with end coiled and locked within the duodenum. Note, approximately 2 cm of additional sideholes were cut peripheral to the radiopaque marker. 2. Successful fluoroscopic guided exchange of existing left-sided 10 French percutaneous biliary drainage catheter with end coiled and locked within the duodenum. Note, approximately 2 cm of additional sideholes were cut peripheral to the radiopaque marker.  PLAN: Patient will return for formal cholangiographic images in approximately 4-6 weeks at which time consideration for potential internal biliary stent placement may be considered. Note, given appearance of the hilar mass on limited cholangiogram performed today, I feel it is unlikely this patient will be a candidate for internal stent placement.   Electronically  Signed   By: Sandi Mariscal M.D.   On: 04/27/2013 17:06   Ir Ptc  04/27/2013   INDICATION: History of metastatic cervical cancer, now with obstructive jaundice due to hilar metastases. Post left-sided ultrasound and fluoroscopic guided percutaneous biliary drainage catheter placement on 04/09/2013, though patient's bilirubin has remained elevated post drain placement. Subsequent biliary catheter check performed 04/23/2013 demonstrates failure of communication between the left and right biliary trees. As such, the request has been made for placement of an additional right-sided biliary drainage catheter.  EXAM: 1. ULTRASOUND AND FLUOROSCOPIC GUIDED PERCUTANEOUS TRANSHEPATIC CHOLANGIOGRAM AND BILIARY TUBE PLACEMENT 2. FLUOROSCOPIC GUIDED  EXCHANGE OF LEFT-SIDED PERCUTANEOUS BILIARY DRAINAGE CATHETER  MEDICATIONS: Versed said 6 mg IV; fentanyl 300 mcg IV; Zosyn 3.375 mg IV  TECHNIQUE: Informed written consent was obtained from the patient after a discussion of the risks, benefits and alternatives to treatment. Questions regarding the procedure were encouraged and answered. A timeout was performed prior to the initiation of the procedure.  The right upper abdominal quadrant and external portion of the existing left biliary drainage catheter was prepped and draped in the usual sterile fashion, and a sterile drape was applied covering the operative field. Maximum barrier sterile technique with sterile gowns and gloves were used for the procedure. A timeout was performed prior to the initiation of the procedure.  Limited contrast injection was performed of the existing left-sided biliary drainage catheter however again failed to delineate communication within the right biliary tree.  Ultrasound scanning of the right upper abdominal quadrant was performed to delineate the anatomy and avoid transgression of the gallbladder or the pleural. A spot along the right mid axillary line was marked fluoroscopically inferior to the  right costophrenic angle.  After the overlying soft tissues were anesthetized with 1% Lidocaine with epinephrine, a 22 gauge Chiba needle was utilized a moderately dilated biliary duct within the anterior inferior segment of the right lobe of the liver. Intra biliary puncture was confirmed with the injection of a small amount of contrast. The duct was cannulated with a Nitrex wire and the tract was dilated with an Accustick set.  The Nitrex wire was advanced to the level of the duodenum. A 4 French angled glide catheter was cut and utilized to exchange the Nitrex wire for an Amplatz stiff guidewire which was coiled within the proximal duodenum. The Accustick and 4 French angled glide catheter were exchanged for a Kumpe catheter. Contrast injection confirmed appropriate positioning.  Over an Amplatz stiff wire, the tract was dilated and a 10.2 [place] biliary drainage catheter was advanced with coil ultimately locked within the duodenum. Note, approximately 2 cm of sideholes were made peripheral to the radiopaque marker. Contrast was injected and a completion radiograph was obtained. The catheter was connected to a drainage bag which yielded the brisk return of clear bile. The catheter was secured to the skin with an interrupted suture.  The existing left biliary drainage catheter was cut and cannulated with an Amplatz wire which was coiled within the proximal duodenum. Under intermittent fluoroscopic guidance, the existing 10 French percutaneous drainage catheter was exchanged for a new 10 French percutaneous drainage catheter with end ultimately coiled and locked within the duodenum. Note, approximately 2 cm of sideholes were made peripheral to the radiopaque marker. Limited contrast injection was performed in a postprocedural spot radiograph was obtained.  The patient tolerated the procedure well without immediate postprocedural complication.  ANESTHESIA/SEDATION: Total Moderate Sedation Time  50 minutes  CONTRAST:   35mL OMNIPAQUE IOHEXOL 300 MG/ML  SOLN  COMPARISON:  CT ABD/PELVIS W CM dated 04/05/2013  FLUOROSCOPY TIME:  6 minutes; 42 seconds.  COMPLICATIONS: Patient developed minimal postprocedural rigors which responded to conservative management including IV fluids, Demerol and Tylenol. Patient remained hemodynamically stable.  FINDINGS: Limited contrast injection of the existing left-sided biliary drainage catheter again failed to delineate communication between the left and right biliary trees.  Successful ultrasound and fluoroscopic guided placement of a 10 French percutaneous biliary drainage catheter via a moderately dilated peripheral duct within the anterior inferior aspect of the right lobe of the liver with an ultimately coiled and locked within  the proximal duodenum. Note, approximately 2 cm of additional sideholes were caught peripheral to the radiopaque marker.  Successful fluoroscopic guided exchange of existing left-sided biliary approach percutaneous biliary drainage catheter with and coiled and locked within the proximal duodenum. Note, approximately 2 cm of additional sideholes were cut peripheral to the radiopaque marker.  Limited contrast injection demonstrates an irregular mass within the confluence of the biliary hilum compatible with the known metastatic disease demonstrated on prior abdominal CT.  IMPRESSION: 1. Successful placement of a new 10.2 French percutaneous right-sided biliary drainage catheter with end coiled and locked within the duodenum. Note, approximately 2 cm of additional sideholes were cut peripheral to the radiopaque marker. 2. Successful fluoroscopic guided exchange of existing left-sided 10 French percutaneous biliary drainage catheter with end coiled and locked within the duodenum. Note, approximately 2 cm of additional sideholes were cut peripheral to the radiopaque marker.  PLAN: Patient will return for formal cholangiographic images in approximately 4-6 weeks at which time  consideration for potential internal biliary stent placement may be considered. Note, given appearance of the hilar mass on limited cholangiogram performed today, I feel it is unlikely this patient will be a candidate for internal stent placement.   Electronically Signed   By: Sandi Mariscal M.D.   On: 04/27/2013 17:06   Ir Patient Eval Tech 0-60 Mins  05/10/2013   Serena Croissant     05/10/2013  8:21 AM Ascencion Dike PAC evaluated patient's biliary drain site.  Below  is his progress note dictated in [facility]:   "Pt returned for eval of biliary drain recently placed.  She noted some leaking from site.  Both biliary drain sites look good.  Small amount bilious staining at right site.  Drain was irrigated with NS. Initially took a little extra force,  which may have dislodged some debris. This also alleviated some  pain she was having. The drain flushed quite easily after that.  A new bag was attached to each drain.  We also reset the stat-lock to allow a little more slack.  She will call with further issues.  Ascencion Dike PA-C  Interventional Radiology  05/03/2013  10:08 AM"    Microbiology: Recent Results (from the past 240 hour(s))  CULTURE, BLOOD (ROUTINE X 2)     Status: None   Collection Time    05/07/13  4:25 PM      Result Value Ref Range Status   Specimen Description BLOOD RIGHT ARM   Final   Special Requests BOTTLES DRAWN AEROBIC AND ANAEROBIC 10CC EACH   Final   Culture  Setup Time     Final   Value: 05/07/2013 20:56     Performed at [facility]   Culture     Final   Value: NO GROWTH 5 DAYS     Performed at [facility]   Report Status 05/13/2013 FINAL   Final  CULTURE, BLOOD (ROUTINE X 2)     Status: None   Collection Time    05/07/13  4:36 PM      Result Value Ref Range Status   Specimen Description BLOOD LEFT ARM   Final   Special Requests BOTTLES DRAWN AEROBIC AND ANAEROBIC [facility] EACH   Final   Culture  Setup Time     Final   Value: 05/08/2013 13:28     Performed at  [facility]   Culture     Final   Value: NO GROWTH 5 DAYS  Performed at [facility]   Report Status 05/14/2013 FINAL   Final  CLOSTRIDIUM DIFFICILE BY PCR     Status: None   Collection Time    05/08/13  5:12 AM      Result Value Ref Range Status   C difficile by pcr NEGATIVE  NEGATIVE Final   Comment: Performed at [place]     Status: None   Collection Time    05/08/13  2:20 PM      Result Value Ref Range Status   Specimen Description LIVER BILE FROM BILIARY DRAINS LEFT   Final   Special Requests Normal   Final   Gram Stain     Final   Value: NO WBC SEEN     NO ORGANISMS SEEN     Performed at [facility]   Culture     Final   Value: FEW ENTEROBACTER CLOACAE     Note: CRITICAL RESULT CALLED TO, READ BACK BY AND VERIFIED WITH: BESS B_0 :13AM ON 05/09/13 BY DANTS     FEW CANDIDA LUSITANIAE     Performed at [facility]   Report Status 05/13/2013 FINAL   Final   Organism ID, Bacteria ENTEROBACTER CLOACAE   Final  BODY FLUID CULTURE     Status: None   Collection Time    05/08/13  2:40 PM      Result Value Ref Range Status   Specimen Description LIVER Bile from biliary drain RIGHT   Final   Special Requests NONE   Final   Gram Stain     Final   Value: NO WBC SEEN     NO ORGANISMS SEEN     Performed at [facility]   Culture     Final   Value: FEW ENTEROBACTER CLOACAE     Note: SUSCEPTIBILITIES PERFORMED ON PREVIOUS CULTURE WITHIN THE LAST 5 DAYS. CRITICAL RESULT CALLED TO, READ BACK BY AND VERIFIED WITH: BESS B_1 :13AM ON 05/09/13 BY DANTS     FEW CANDIDA LUSITANIAE     Performed at [facility]   Report Status 05/13/2013 FINAL   Final     Labs: Basic Metabolic Panel:  Recent Labs Lab 05/09/13 0515 05/10/13 0515 05/12/13 0445 05/14/13 0448  NA 135* 132* 134* 134*  K 3.5* 3.2* 3.4* 3.4*  CL 101 97 101 99  CO2 _2 GLUCOSE 108* 107* 121* 111*  BUN 4* 5* 6 5*  CREATININE 0.57  0.53 0.55 0.48*  CALCIUM 8.3* 8.3* 8.3* 8.0*  PHOS 2.5  --   --   --    Liver Function Tests:  Recent Labs Lab 05/09/13 0515 05/14/13 0448  AST 18 17  ALT 16 13  ALKPHOS 589* 588*  BILITOT 2.9* 2.6*  PROT 5.5* 5.4*  ALBUMIN 1.7* 1.6*   No results found for this basename: LIPASE, AMYLASE,  in the last 168 hours No results found for this basename: AMMONIA,  in the last 168 hours CBC:  Recent Labs Lab 05/09/13 0515 05/10/13 0515 05/12/13 0445 05/14/13 0448  WBC 6.1 8.0 7.3 7.3  HGB 9.0* 9.5* 9.2* 9.1*  HCT 27.9* 29.4* 28.8* 28.6*  MCV 90.6 90.5 90.9 91.1  PLT 281 290 304 294   Cardiac Enzymes: No results found for this basename: CKTOTAL, CKMB, CKMBINDEX, TROPONINI,  in the last 168 hours BNP: BNP (last 3 results) No results found for this basename: PROBNP,  in the last 8760 hours CBG: No  results found for this basename: GLUCAP,  in the last 168 hours     Signed:  Lelon Frohlich  [facility] Pager: [phone] 05/15/2013, 3:57 PM

## 2013-05-15 NOTE — Progress Notes (Signed)
Chaplain inquired about providing pastoral care to patient. Chaplain consulted with Susie, the patient's nurse and was advised that the patient was in the process of being discharged.   05/15/13 1500  Clinical Encounter Type  Visited With Health care provider  Visit Type Initial

## 2013-05-17 NOTE — Progress Notes (Signed)
Pt missed last appt - inpatient.  Requesting reschedule.  POF sent for shared appointment.

## 2013-05-18 ENCOUNTER — Telehealth: Payer: Self-pay | Admitting: Oncology

## 2013-05-18 NOTE — Telephone Encounter (Signed)
, °

## 2013-05-21 ENCOUNTER — Telehealth: Payer: Self-pay | Admitting: *Deleted

## 2013-05-21 ENCOUNTER — Encounter: Payer: Self-pay | Admitting: Internal Medicine

## 2013-05-21 ENCOUNTER — Ambulatory Visit (HOSPITAL_BASED_OUTPATIENT_CLINIC_OR_DEPARTMENT_OTHER): Payer: 59 | Admitting: Adult Health

## 2013-05-21 ENCOUNTER — Telehealth: Payer: Self-pay | Admitting: Oncology

## 2013-05-21 ENCOUNTER — Other Ambulatory Visit (HOSPITAL_BASED_OUTPATIENT_CLINIC_OR_DEPARTMENT_OTHER): Payer: 59

## 2013-05-21 ENCOUNTER — Encounter: Payer: Self-pay | Admitting: Adult Health

## 2013-05-21 ENCOUNTER — Ambulatory Visit (INDEPENDENT_AMBULATORY_CARE_PROVIDER_SITE_OTHER): Payer: 59 | Admitting: Internal Medicine

## 2013-05-21 VITALS — BP 98/68 | HR 97 | Temp 97.9°F | Resp 18 | Ht 61.0 in | Wt 163.0 lb

## 2013-05-21 VITALS — BP 112/70 | HR 90 | Temp 97.9°F | Wt 164.0 lb

## 2013-05-21 DIAGNOSIS — Z86718 Personal history of other venous thrombosis and embolism: Secondary | ICD-10-CM

## 2013-05-21 DIAGNOSIS — C770 Secondary and unspecified malignant neoplasm of lymph nodes of head, face and neck: Secondary | ICD-10-CM

## 2013-05-21 DIAGNOSIS — F329 Major depressive disorder, single episode, unspecified: Secondary | ICD-10-CM

## 2013-05-21 DIAGNOSIS — K8309 Other cholangitis: Secondary | ICD-10-CM

## 2013-05-21 DIAGNOSIS — R5381 Other malaise: Secondary | ICD-10-CM

## 2013-05-21 DIAGNOSIS — C787 Secondary malignant neoplasm of liver and intrahepatic bile duct: Secondary | ICD-10-CM

## 2013-05-21 DIAGNOSIS — F3289 Other specified depressive episodes: Secondary | ICD-10-CM

## 2013-05-21 DIAGNOSIS — C649 Malignant neoplasm of unspecified kidney, except renal pelvis: Secondary | ICD-10-CM

## 2013-05-21 DIAGNOSIS — R17 Unspecified jaundice: Secondary | ICD-10-CM

## 2013-05-21 DIAGNOSIS — G893 Neoplasm related pain (acute) (chronic): Secondary | ICD-10-CM

## 2013-05-21 DIAGNOSIS — C539 Malignant neoplasm of cervix uteri, unspecified: Secondary | ICD-10-CM

## 2013-05-21 DIAGNOSIS — E46 Unspecified protein-calorie malnutrition: Secondary | ICD-10-CM

## 2013-05-21 DIAGNOSIS — C778 Secondary and unspecified malignant neoplasm of lymph nodes of multiple regions: Secondary | ICD-10-CM

## 2013-05-21 DIAGNOSIS — R5383 Other fatigue: Secondary | ICD-10-CM

## 2013-05-21 DIAGNOSIS — F411 Generalized anxiety disorder: Secondary | ICD-10-CM

## 2013-05-21 DIAGNOSIS — B9689 Other specified bacterial agents as the cause of diseases classified elsewhere: Secondary | ICD-10-CM

## 2013-05-21 DIAGNOSIS — R7881 Bacteremia: Secondary | ICD-10-CM

## 2013-05-21 LAB — CBC WITH DIFFERENTIAL/PLATELET
BASO%: 0.6 % (ref 0.0–2.0)
Basophils Absolute: 0 10*3/uL (ref 0.0–0.1)
EOS%: 1 % (ref 0.0–7.0)
Eosinophils Absolute: 0.1 10*3/uL (ref 0.0–0.5)
HCT: 27.8 % — ABNORMAL LOW (ref 34.8–46.6)
HGB: 9 g/dL — ABNORMAL LOW (ref 11.6–15.9)
LYMPH%: 14.5 % (ref 14.0–49.7)
MCH: 28.5 pg (ref 25.1–34.0)
MCHC: 32.4 g/dL (ref 31.5–36.0)
MCV: 88 fL (ref 79.5–101.0)
MONO#: 0.8 10*3/uL (ref 0.1–0.9)
MONO%: 12.3 % (ref 0.0–14.0)
NEUT#: 4.6 10*3/uL (ref 1.5–6.5)
NEUT%: 71.6 % (ref 38.4–76.8)
PLATELETS: 310 10*3/uL (ref 145–400)
RBC: 3.16 10*6/uL — AB (ref 3.70–5.45)
RDW: 15.4 % — ABNORMAL HIGH (ref 11.2–14.5)
WBC: 6.5 10*3/uL (ref 3.9–10.3)
lymph#: 0.9 10*3/uL (ref 0.9–3.3)

## 2013-05-21 LAB — COMPREHENSIVE METABOLIC PANEL (CC13)
ALT: 14 U/L (ref 0–55)
AST: 22 U/L (ref 5–34)
Albumin: 1.8 g/dL — ABNORMAL LOW (ref 3.5–5.0)
Alkaline Phosphatase: 665 U/L — ABNORMAL HIGH (ref 40–150)
Anion Gap: 9 mEq/L (ref 3–11)
BILIRUBIN TOTAL: 2.11 mg/dL — AB (ref 0.20–1.20)
BUN: 18.2 mg/dL (ref 7.0–26.0)
CO2: 21 mEq/L — ABNORMAL LOW (ref 22–29)
Calcium: 9.1 mg/dL (ref 8.4–10.4)
Chloride: 104 mEq/L (ref 98–109)
Creatinine: 0.9 mg/dL (ref 0.6–1.1)
Glucose: 103 mg/dl (ref 70–140)
Potassium: 3.8 mEq/L (ref 3.5–5.1)
SODIUM: 134 meq/L — AB (ref 136–145)
TOTAL PROTEIN: 6.4 g/dL (ref 6.4–8.3)

## 2013-05-21 MED ORDER — DILTIAZEM HCL ER COATED BEADS 180 MG PO CP24
180.0000 mg | ORAL_CAPSULE | Freq: Every day | ORAL | Status: DC
Start: 2013-05-21 — End: 2013-06-08

## 2013-05-21 NOTE — Progress Notes (Addendum)
Hematology and Oncology Follow Up Visit  [name] [id number] 07/24/1950 63 y.o. 05/22/2013 3:11 PM  Principle Diagnosis: Patient is a 63 year old female with metastatic cervical cancer.     Prior Therapy:  1.  s/p radical hysterectomy with bilateral salpingo-oophorectomy and pelvic lymphadenectomy in March 2005 for a stage IB cervical cancer. Her final pathology showed no residual disease and all lymph nodes were negative.   #2 She was followed for 5 years with no evidence of recurrent disease. She had a recurrence in the right pelvic sidewall in February 2010 which caused an obstruction of the right ureter. s/p radiation therapy and concurrent Cisplatinum chemotherapy.   #3 In late 2011, she was found to have recurrence of the right inguinal lymph node and left pelvic nodes. She received additional radiation therapy to those areas. Radiation was completed in January 2012. In April 2012 the patient had resolution of the right inguinal nodes.   #03 October 2010 the patient was found to have a liver lesion which measured 2.8 cm. She received 6 cycles of Taxol with a partial response to chemotherapy with a liver lesion decreasing in size to 1.6 cm in 12/2010 PET scan. PET scan also revealed that there were no other lesions except the liver. Patient elected to ablate the liver lesion using RFA he performed on 05/07/2011. The procedure went well but patient developed an extensive DVT in the left lower extremity. She started receiving anticoagulant therapy with Lovenox and an IVC filter was placed.   #5 s/p radiation therapy in June, August and September 2013.   #05 January 2012, she was found to have metastasis in left inguinal lymph nodes. S/p low-dose chemotherapy consisting of Carboplatinum and Taxol in December and January 2014. Chemotherapy course was complicated by neutropenia. CT scans in February 2014 Showed an increase in size of the liver metastasis.   #7 s/p Alimta in May 2014 - 10/26/12  x 2 cycles.   #8 restaging scans on 01/16/2013: progresive disease in the pelvis and therefore restarted on single agent alimta with lower doses. Completed another 7 cycles.   #9 atrial fibrillation: Patient is on metoprolol and diltiazem   #10 bone health: She is receiving prolia every 6 months. Last dose administered November 2014   #11 recent restaging scans in January 2015 revealed progressive disease with subsequent deveopment of obstructive jaundice. She is s/p percutaneous biliary stent placment by IR.   Current therapy: s/p alimta x 9 cycles   Interim History:  The patient is here today for labs and evaluation and to discuss her future chemotherapy plans for her metastatic cervical cancer.  She is doing moderately well today.  She continues to be weak.  She was admitted to the hospital on 05/04/13 due to a fever.  She had percutaneous biliary drains exchanged and was started on IV antibiotics.  She was discharged from the hospital last Tuesday, 05/15/13.  Her drains are doing well and her t bili today is 2.11 compared to 2.6 a couple of weeks ago.  Her pain is very well controlled with her fentanyl patch and dilaudid prn.  She is taking the Celexa only on occasion when she feels like she needs it due to side effects.  She says it makes her very tired and lethargic.  Her constipation isn't a problem for her right now.  She is taking Xeralto daily and has no difficulty or easy bruising/bleeding with this.  Her appetite continues to be decreased.  She eats fruit,   protein drinks, chicken, and soups.  She is drinking about 4 bottles of water, and an occasional soda or juice.    Medications:  Current Outpatient Prescriptions  Medication Sig Dispense Refill  . cetirizine (ZYRTEC) 10 MG tablet Take 10 mg by mouth daily as needed for allergies.       . ciprofloxacin (CIPRO) 500 MG tablet Take 1 tablet (500 mg total) by mouth 2 (two) times daily. Take until 3/24.  16 tablet  0  . diltiazem (CARDIZEM  CD) 180 MG 24 hr capsule Take 1 capsule (180 mg total) by mouth daily. TAke 2-3 hours before of after Metoprolol  30 capsule  5  . fentaNYL (DURAGESIC - DOSED MCG/HR) 100 MCG/HR Apply 2 patches every 72 hours (total of 200 mcg)  20 patch  0  . ferrous fumarate (HEMOCYTE - 106 MG FE) 325 (106 FE) MG TABS Take 1 tablet (106 mg of iron total) by mouth daily.  30 each  0  . fluconazole (DIFLUCAN) 200 MG tablet Take 2 tablets (400 mg total) by mouth daily. Take until 3/24.  16 tablet  0  . folic acid (FOLVITE) 1 MG tablet Take 1 tablet (1 mg total) by mouth every morning.  30 tablet  6  . HYDROmorphone (DILAUDID) 4 MG tablet Take 1 tablet (4 mg total) by mouth every 2 (two) hours as needed for severe pain.  45 tablet  0  . lidocaine-prilocaine (EMLA) cream Apply 1 application topically daily as needed (for port access).  30 g  6  . metoprolol (LOPRESSOR) 50 MG tablet Take 1 tablet (50 mg total) by mouth 2 (two) times daily.      . Nutritional Supplements (CARNATION INSTANT BREAKFAST PO) Take 1 Can by mouth 4 (four) times daily as needed (for meals).      . pantoprazole (PROTONIX) 40 MG tablet Take 1 tablet (40 mg total) by mouth daily.  30 tablet  2  . potassium chloride (KLOR-CON) 8 MEQ tablet Take 8 mEq by mouth 2 (two) times daily.       . Rivaroxaban (XARELTO) 20 MG TABS tablet Take 1 tablet (20 mg total) by mouth every evening.  30 tablet  5  . zolpidem (AMBIEN) 5 MG tablet Take 1 tablet (5 mg total) by mouth at bedtime as needed for sleep.  20 tablet  0  . bisacodyl (DULCOLAX) 5 MG EC tablet Take 5 mg by mouth daily as needed for constipation.      . docusate sodium (COLACE) 100 MG capsule Take 1 capsule (100 mg total) by mouth 2 (two) times daily.  60 capsule  1  . ibuprofen (ADVIL,MOTRIN) 200 MG tablet Take 600 mg by mouth every 4 (four) hours as needed. Pain      . LORazepam (ATIVAN) 1 MG tablet Take 1 tablet (1 mg total) by mouth every 6 (six) hours as needed (for nausea (may put under tongue)).   30 tablet  2  . ondansetron (ZOFRAN) 8 MG tablet Take 1 tablet (8 mg total) by mouth every 8 (eight) hours as needed for nausea.  30 tablet  2  . sodium chloride (OCEAN) 0.65 % nasal spray Place 1 spray into the nose daily as needed for congestion. Allergies      . triamcinolone (NASACORT) 55 MCG/ACT nasal inhaler Place 2 sprays into the nose daily as needed (allergies or congestion).        No current facility-administered medications for this visit.     Allergies:  Allergies  Allergen Reactions  . Codeine Other (See Comments)    Breathing issues  . Flecainide Acetate Other (See Comments)    Leg cramping   . Morphine Other (See Comments)     Hallucinations  . Propafenone Hcl Other (See Comments)    Leg cramps    Past Medical History, Surgical history, Social history, and Family History were reviewed and updated.  Review of Systems: A 10 point review of systems was conducted and is otherwise negative except for what is noted above.     Physical Exam: Blood pressure 98/68, pulse 97, temperature 97.9 F (36.6 C), temperature source Oral, resp. rate 18, height 5' 1" (1.549 m), weight 163 lb (73.936 kg). GENERAL: Patient is a well appearing female in no acute distress HEENT:  Sclerae anicteric.  Oropharynx clear and moist. No ulcerations or evidence of oropharyngeal candidiasis. Neck is supple.  NODES:  No cervical, supraclavicular, or axillary lymphadenopathy palpated.  BREAST EXAM:  Deferred. LUNGS:  Clear to auscultation bilaterally.  No wheezes or rhonchi. HEART:  Regular rate and rhythm. No murmur appreciated. ABDOMEN:  Soft, nontender.  Positive, normoactive bowel sounds. No organomegaly palpated. MSK:  No focal spinal tenderness to palpation. Full range of motion bilaterally in the upper extremities. EXTREMITIES:  No peripheral edema.   SKIN:  Clear with no obvious rashes or skin changes. No nail dyscrasia. NEURO:  Nonfocal. Well oriented.  Appropriate affect. ECOG:        Lab Results: Lab Results  Component Value Date   WBC 6.5 05/21/2013   HGB 9.0* 05/21/2013   HCT 27.8* 05/21/2013   MCV 88.0 05/21/2013   PLT 310 05/21/2013     Chemistry      Component Value Date/Time   NA 134* 05/21/2013 1115   NA 134* 05/14/2013 0448   K 3.8 05/21/2013 1115   K 3.4* 05/14/2013 0448   CL 99 05/14/2013 0448   CL 108* 08/15/2012 1252   CO2 21* 05/21/2013 1115   CO2 26 05/14/2013 0448   BUN 18.2 05/21/2013 1115   BUN 5* 05/14/2013 0448   CREATININE 0.9 05/21/2013 1115   CREATININE 0.48* 05/14/2013 0448   CREATININE 1.45* 09/11/2010 1603      Component Value Date/Time   CALCIUM 9.1 05/21/2013 1115   CALCIUM 8.0* 05/14/2013 0448   ALKPHOS 665* 05/21/2013 1115   ALKPHOS 588* 05/14/2013 0448   AST 22 05/21/2013 1115   AST 17 05/14/2013 0448   ALT 14 05/21/2013 1115   ALT 13 05/14/2013 0448   BILITOT 2.11* 05/21/2013 1115   BILITOT 2.6* 05/14/2013 0448        Assessment and Plan:  1.  Metastatic Cervical Cancer: Patient is here for discussion of more treatment options and this will take place with Dr. Humphrey Rolls.  I asked the patient what her goals of treatment were.  She stated that though she is not quite ready to "throw in the towel", she doesn't want to be increasingly weak and sick from treatment either.  She states she doesn't quite feel 100% ready for treatment quite yet.  Her labs are stable today.  I reviewed them with her and her husband in detail.  Particularly the fact that her t bili is down to 2.1.  2.  Chronic Pain:  The patient's pain is much improved.  She will continue to take Fentanyl patches and Dilaudid PRN.    3.  Hyperbilirubinemia:  She is s/p percutaneous biliary drain placement.  The original plan was for  those to be internalized, but she did require two drains.  Therefore she will have them exchanged every 6 weeks and they will remain external.  Her next appt for this is on 06/08/13.  4.  Depression/anxiety:  The patient is taking Celexa on a PRN basis  due to the side effects that she experiences because of this medication.  I informed the patient that Celexa doesn't work well when taken just if needed.  She doesn't tolerate the side effects well enough to tolerate the medication, therefore we will stop the medication completely today.    5.  DVT:  She will continue on Xeralto.  She does have increased left extremity swelling due to lymphedema, and her recent hospitalization.  I recommended she continue to elevate her legs and she states that she will be undergoing physical therapy in the near future.    6. Nutrition:  The patient does have a calorie malnutrition.  She does have a decreased appetite and a hypoalbuminemia.  I continued to encourage increased protein and caloric intake at today's visit.    The patient will undergo a restaging CT chest, abdomen, pelvis and f/u with [place] after her drains are exchanged.  We will see her back on 06/19/13 for labs, evaluation, and treatment.  After the patient met with Dr. Humphrey Rolls, I gave her information on Topetecan.    The patient knows to call if she has any questions or concerns or if she needs to be seen sooner than her next appt.    I spent 25 minutes counseling the patient face to face.  The total time spent in the appointment was 30 minutes.   Minette Headland, NP Medical Oncology [place] [phone]/24/[phone]:11 PM   ADDENDUM:    I personally saw this patient and performed a substantive portion of this encounter with the listed APP documented above.   Patient with metastatic cervical carcinoma. Today I saw the patient with the nurse practitioner. I discussed further treatment options as patient desires this. She and I discussed different chemotherapeutic agents that are available. This would be certainly second and third line treatments. We discussed the possibility of doing topotecan. However she is not ready to get started just yet. She wants to get stronger a little  bit more. I do think that is fine. We will plan on doing restaging scans on her. She will return on 06/19/2013 for possibility of beginning treatment.   Marcy Panning, MD

## 2013-05-21 NOTE — Assessment & Plan Note (Signed)
Dx 04/2003: s/p chemo/debulk/XRT Recurrent dx 04/2009 L pelvic wall and liver met - resumed chemo 09/2010 - 03/05/2011 Trial Alimita 06/2012 caused neutropenic fever (hosp x 1 week for same), side effects intolerable Multiple complications related to same and renal involvement/obstruction - Reviewed hx hematuria with XRT (hx same with ureter lesion during 6269 XRT) - complicated by anticoag  Also DVT LLE 10/2010 and 04/2011 likely related to hypercoagulable state from malignancy - s/p IVC filter 04/2011 Hepatic metastatic lesion RF ablation done 05/07/11 by  interventional radiology  L flank discomfort related to increase LN size on 05/2011 CT and PET 08/2011>  restarted XRT for same 09/2011 -  Chronic persisting RUQ pain related to liver mets Continue pain mgmt as ongoing Chemo per onc Humphrey Rolls since fall 2014) and follow up gyn onc as planned - ?2nd opinion at [facility]

## 2013-05-21 NOTE — Progress Notes (Signed)
Subjective:    Patient ID: [name], female    DOB: 1950/05/24, 63 y.o.   MRN: [id number]  HPI Seen today for post hospital follow up - admitted 3/6 through 3/19: Sepsis/ gram negative bacteremia /Ascending Cholangitis  -Initial BC with serratia; repeat 3/9 remain negative.  -Biliary drain cx with eneterobacter and candida.  -Per ID, will do Cipro/fluconazole on DC until 3/24.  -Sepsis parameters resolved.  -Has 2 biliary drains that cannot be internalized, per IR due to hepatic metastatic disease.  -Will needs biliary drain exchanges every 6 weeks.  Atrial fibrillation with RVR  - Rate controlled at present.  -DC on metoprolol and cardizem for rate control.  -Is on xarelto for anticoagulation.  Cervical cancer  - Pt to follow up with oncologist on discharge  Anemia of chronic disease  - Hemoglobin currently stable  Hyponatremia  - Most likely due to poor oral solute intake.  -Resolved.  Protein-calorie malnutrition, severe  - Currently on regular diet and consuming most of her meals.  History of DVT (deep vein thrombosis)  - Patient is currently on xarelto    Since discharge patient reports decreased energy level.  She is cognizant of diet and is using carnation instant breakfast twice daily for calories.   No further drainage from biliary drain insertion sites. No redness or other signs of infection.  No fevers or chills.  Finishes antibiotics 3/24, follow up with ID scheduled for 3/24    Past Medical History  Diagnosis Date  . Diastolic dysfunction   . MIGRAINE HEADACHE   . Atrial fibrillation     failed DCCV, CHADs2-prev pradaxa stopped due to hematuria with ureter issues/JJ   . Anemia   . HTN (hypertension)   . GLAUCOMA   . CARPAL TUNNEL SYNDROME, RIGHT   . Lymphedema     L>R leg from pelvic XRT/scarring  . Hepatic steatosis     CT scan 06/2009, 12/2009  . DVT (deep venous thrombosis) 10/2010    LLE, anticoag resumed  . CHF (congestive heart failure)    1 yr ago per pt  . Arthritis   . GERD (gastroesophageal reflux disease)   . Small kidney     left  . Radiation 10/11/11-11/19/11    5040 cGy in 28 fx's/periaortic  . Radiation 03/16/10-03/25/10    right inguinal node/left external iliac node  . Radiation 06/11/08-07/23/08    6000 cGy left pelvis  . Hyperlipidemia   . Leg swelling     left  . Cervical cancer dx 04/2003, recur 04/2008    s/p debulk (TAHBSO), chemo/XRT; recurrent dz L pelvis dx 2010 and liver met 09/2010 s/p RFA  . Osteoporosis with fracture 2014    compression fx, s/p KP ; started prolia 06/2012- narcotic pain mgmt     Review of Systems  Constitutional: Positive for appetite change (decreased appetite) and fatigue. Negative for fever, chills and activity change.  Respiratory: Negative for chest tightness, shortness of breath and wheezing.   Cardiovascular: Negative for chest pain, palpitations and leg swelling.  Gastrointestinal: Negative for nausea, vomiting, diarrhea and constipation.  Neurological: Negative for syncope, light-headedness and headaches.       Objective:   Physical Exam  Constitutional: She is oriented to person, place, and time. She appears well-developed and well-nourished. No distress.  Neck: Normal range of motion. Neck supple. No thyromegaly present.  Cardiovascular: Normal rate.  An irregularly irregular rhythm present.  No murmur heard. Pulmonary/Chest: Effort normal and breath sounds normal. No  respiratory distress. She has no wheezes.  Abdominal: Soft. Bowel sounds are normal.  2 dressed biliary drains noted on anterior and right lateral abdomen  Musculoskeletal: Normal range of motion.  Lymphadenopathy:    She has no cervical adenopathy.  Neurological: She is alert and oriented to person, place, and time.  Skin: Skin is warm and dry. No rash noted. She is not diaphoretic. No erythema.  Psychiatric: She has a normal mood and affect. Her behavior is normal. Judgment and thought content normal.     Wt Readings from Last 3 Encounters:  05/21/13 164 lb (74.39 kg)  05/15/13 166 lb 14.4 oz (75.705 kg)  05/03/13 160 lb 6.4 oz (72.757 kg)   BP Readings from Last 3 Encounters:  05/21/13 112/70  05/15/13 128/88  05/03/13 93/66   Lab Results  Component Value Date   WBC 7.3 05/14/2013   HGB 9.1* 05/14/2013   HCT 28.6* 05/14/2013   PLT 294 05/14/2013   GLUCOSE 111* 05/14/2013   ALT 13 05/14/2013   AST 17 05/14/2013   NA 134* 05/14/2013   K 3.4* 05/14/2013   CL 99 05/14/2013   CREATININE 0.48* 05/14/2013   BUN 5* 05/14/2013   CO2 26 05/14/2013   TSH 1.904 05/05/2013   INR 2.15* 05/09/2013   HGBA1C 5.2 09/19/2008       Assessment & Plan:   Fatigue - expected and related to acute illness and ongoing medical treatment. No intervention or changes recommended. Support offered at length  Problem List Items Addressed This Visit   Ascending cholangitis   Cervical cancer     Dx 04/2003: s/p chemo/debulk/XRT Recurrent dx 04/2009 L pelvic wall and liver met - resumed chemo 09/2010 - 03/05/2011 Trial Alimita 06/2012 caused neutropenic fever (hosp x 1 week for same), side effects intolerable Multiple complications related to same and renal involvement/obstruction - Reviewed hx hematuria with XRT (hx same with ureter lesion during 0539 XRT) - complicated by anticoag  Also DVT LLE 10/2010 and 04/2011 likely related to hypercoagulable state from malignancy - s/p IVC filter 04/2011 Hepatic metastatic lesion RF ablation done 05/07/11 by  interventional radiology  L flank discomfort related to increase LN size on 05/2011 CT and PET 08/2011>  restarted XRT for same 09/2011 -  Chronic persisting RUQ pain related to liver mets Continue pain mgmt as ongoing Chemo per onc Humphrey Rolls since fall 2014) and follow up gyn onc as planned - ?2nd opinion at [facility]     Gram-negative bacteremia - Primary     Hospitalization for same March 2015 reviewed, presumably secondary to biliary obstruction and ascending cholangitis related to  drain Completing anabiotic course tomorrow, followup with ID as planned Interval history reviewed, no changes recommended       Time spent with pt today 25 minutes, greater than 50% time spent counseling patient on Hospitalization for gram-negative sepsis, metastatic cervical cancer and current state of treatment, plans for second opinion and medication review. Also review of prior records

## 2013-05-21 NOTE — Patient Instructions (Signed)
Topotecan injection  What is this medicine?  TOPOTECAN (TOE poe TEE kan) is a chemotherapy drug. It is used to treat lung cancer, ovarian cancer, and cervical cancer.  This medicine may be used for other purposes; ask your health care provider or pharmacist if you have questions.  COMMON BRAND NAME(S): Hycamtin  What should I tell my health care provider before I take this medicine?  They need to know if you have any of these conditions:  -blood disorders  -dehydration  -diarrhea  -immune system problems  -infection (especially a virus infection such as chickenpox, cold sores, or herpes)  -kidney disease  -low blood counts, like low white cell, platelet, or red cell counts  -recent or ongoing radiation therapy  -an unusual or allergic reaction to topotecan, other medicines, foods, dyes, or preservatives  -pregnant or trying to get pregnant  -breast-feeding  How should I use this medicine?  This medicine is for infusion into a vein. It is usually given by a health care professional in a hospital or clinic setting.  In rare cases, you might get this medicine at home. You will be taught how to give this medicine. Use exactly as directed. Take your medicine at regular intervals. Do not take your medicine more often than directed.  It is important that you put your used needles and syringes in a special sharps container. Do not put them in a trash can. If you do not have a sharps container, call your pharmacist or healthcare provider to get one.  Talk to your pediatrician regarding the use of this medicine in children. Special care may be needed.  Overdosage: If you think you have taken too much of this medicine contact a poison control center or emergency room at once.  NOTE: This medicine is only for you. Do not share this medicine with others.  What if I miss a dose?  It is important not to miss your dose. Call your doctor or health care professional if you are unable to keep an appointment.  What may interact with  this medicine?  -amiodarone  -antiviral medicines for HIV or AIDS  -cisplatin  -clarithromycin  -cyclosporine  -diltiazem  -erythromycin  -grapefruit or grapefruit juice  -medicines for fungal infections like ketoconazole and itraconazole  -mefloquine  -mifepristone, RU-486  -nicardipine  -phenytoin  -propafenone  -quinidine  -tacrolimus  -tamoxifen  -testosterone  -vaccines  -verapamil  Talk to your prescriber or health care professional before taking any of these medicines:  -aspirin  -acetaminophen  -ibuprofen  -naproxen  -ketoprofen  This list may not describe all possible interactions. Give your health care provider a list of all the medicines, herbs, non-prescription drugs, or dietary supplements you use. Also tell them if you smoke, drink alcohol, or use illegal drugs. Some items may interact with your medicine.  What should I watch for while using this medicine?  This drug may make you feel generally unwell. This is not uncommon, as chemotherapy can affect healthy cells as well as cancer cells. Report any side effects. Continue your course of treatment even though you feel ill unless your doctor tells you to stop.  Call your doctor or health care professional for advice if you get a fever, chills or sore throat, or other symptoms of a cold or flu. Do not treat yourself. This drug decreases your body's ability to fight infections. Try to avoid being around people who are sick.  This medicine may increase your risk to bruise or bleed. Call   your doctor or health care professional if you notice any unusual bleeding.  Be careful brushing and flossing your teeth or using a toothpick because you may get an infection or bleed more easily. If you have any dental work done, tell your dentist you are receiving this medicine.  Avoid taking products that contain aspirin, acetaminophen, ibuprofen, naproxen, or ketoprofen unless instructed by your doctor. These medicines may hide a fever.  Do not become pregnant while  taking this medicine. Women should inform their doctor if they wish to become pregnant or think they might be pregnant. There is a potential for serious side effects to an unborn child. Talk to your health care professional or pharmacist for more information. Do not breast-feed an infant while taking this medicine.  What side effects may I notice from receiving this medicine?  Side effects that you should report to your doctor or health care professional as soon as possible:  -allergic reactions like skin rash, itching or hives, swelling of the face, lips, or tongue  -breathing difficulties  -diarrhea  -dizziness  -fever or chills, sore throat  -mouth sores or pain  -pain, tingling, numbness in the hands or feet  -unusual bleeding or bruising  -unusually weak or tired  -yellowing of the eyes or skin  Side effects that usually do not require medical attention (report to your doctor or health care professional if they continue or are bothersome):  -hair loss  -headache  -loss of appetite  -nausea, vomiting  -stomach pain  This list may not describe all possible side effects. Call your doctor for medical advice about side effects. You may report side effects to FDA at 1-800-FDA-1088.  Where should I keep my medicine?  Keep out of the reach of children.  This drug is usually given in a hospital or clinic and will not be stored at home.  In rare cases, this medicine may be given at home. If you are using this medicine at home, you will be instructed on how to store this medicine. Throw away any unused medicine after the expiration date on the label.  NOTE: This sheet is a summary. It may not cover all possible information. If you have questions about this medicine, talk to your doctor, pharmacist, or health care provider.   2014, Elsevier/Gold Standard. (2007-11-01 17:25:53)

## 2013-05-21 NOTE — Telephone Encounter (Signed)
, °

## 2013-05-21 NOTE — Assessment & Plan Note (Signed)
Hospitalization for same March 2015 reviewed, presumably secondary to biliary obstruction and ascending cholangitis related to drain Completing anabiotic course tomorrow, followup with ID as planned Interval history reviewed, no changes recommended

## 2013-05-21 NOTE — Telephone Encounter (Signed)
Per staff message and POF I have scheduled appts.  JMW  

## 2013-05-21 NOTE — Patient Instructions (Signed)
It was good to see you today.  We have reviewed your prior records including hospitalization, labs and tests today  Medications reviewed and updated, no new changes recommended at this time.  Let me know if referral to [facility] is needed for second opinion  Followup here in 3-6 months, sooner if problems

## 2013-05-21 NOTE — Progress Notes (Signed)
Pre visit review using our clinic review tool, if applicable. No additional management support is needed unless otherwise documented below in the visit note. 

## 2013-05-22 ENCOUNTER — Encounter: Payer: Self-pay | Admitting: Internal Medicine

## 2013-05-22 ENCOUNTER — Ambulatory Visit (INDEPENDENT_AMBULATORY_CARE_PROVIDER_SITE_OTHER): Payer: 59 | Admitting: Internal Medicine

## 2013-05-22 VITALS — BP 123/83 | HR 86 | Ht 61.0 in | Wt 162.0 lb

## 2013-05-22 DIAGNOSIS — R7881 Bacteremia: Secondary | ICD-10-CM

## 2013-05-22 NOTE — Progress Notes (Signed)
Subjective:    Patient ID: [name], female    DOB: 08-16-1950, 63 y.o.   MRN: [id number]  HPI 63yo F with metastatic uterine cancer recently admitted for ascending cholangitis found to have serratia and c.lusietania bacteremia. She is on cipro and candida to finish today. Still has port in place. Meet with dr. Humphrey Rolls last week to discuss further cancer treatment. Being considered for topetetan? She reports having Had low grade fever of 100.1 x 2 hr last Saturday that broke in 2 hrs. She is Having repeat abd ct tomorrow. Thus far recommendations is to keep drains externalized until further notice and thus will be doing  q6wk exchange.  Current Outpatient Prescriptions on File Prior to Visit  Medication Sig Dispense Refill  . bisacodyl (DULCOLAX) 5 MG EC tablet Take 5 mg by mouth daily as needed for constipation.      . cetirizine (ZYRTEC) 10 MG tablet Take 10 mg by mouth daily as needed for allergies.       [name] diltiazem (CARDIZEM CD) 180 MG 24 hr capsule Take 1 capsule (180 mg total) by mouth daily. TAke 2-3 hours before of after Metoprolol  30 capsule  5  . docusate sodium (COLACE) 100 MG capsule Take 1 capsule (100 mg total) by mouth 2 (two) times daily.  60 capsule  1  . fentaNYL (DURAGESIC - DOSED MCG/HR) 100 MCG/HR Apply 2 patches every 72 hours (total of 200 mcg)  20 patch  0  . ferrous fumarate (HEMOCYTE - 106 MG FE) 325 (106 FE) MG TABS Take 1 tablet (106 mg of iron total) by mouth daily.  30 each  0  . folic acid (FOLVITE) 1 MG tablet Take 1 tablet (1 mg total) by mouth every morning.  30 tablet  6  . HYDROmorphone (DILAUDID) 4 MG tablet Take 1 tablet (4 mg total) by mouth every 2 (two) hours as needed for severe pain.  45 tablet  0  . ibuprofen (ADVIL,MOTRIN) 200 MG tablet Take 600 mg by mouth every 4 (four) hours as needed. Pain      . lidocaine-prilocaine (EMLA) cream Apply 1 application topically daily as needed (for port access).  30 g  6  . LORazepam (ATIVAN) 1 MG tablet Take 1  tablet (1 mg total) by mouth every 6 (six) hours as needed (for nausea (may put under tongue)).  30 tablet  2  . metoprolol (LOPRESSOR) 50 MG tablet Take 1 tablet (50 mg total) by mouth 2 (two) times daily.      . Nutritional Supplements (CARNATION INSTANT BREAKFAST PO) Take 1 Can by mouth 4 (four) times daily as needed (for meals).      . ondansetron (ZOFRAN) 8 MG tablet Take 1 tablet (8 mg total) by mouth every 8 (eight) hours as needed for nausea.  30 tablet  2  . pantoprazole (PROTONIX) 40 MG tablet Take 1 tablet (40 mg total) by mouth daily.  30 tablet  2  . potassium chloride (KLOR-CON) 8 MEQ tablet Take 8 mEq by mouth 2 (two) times daily.       . Rivaroxaban (XARELTO) 20 MG TABS tablet Take 1 tablet (20 mg total) by mouth every evening.  30 tablet  5  . sodium chloride (OCEAN) 0.65 % nasal spray Place 1 spray into the nose daily as needed for congestion. Allergies      . triamcinolone (NASACORT) 55 MCG/ACT nasal inhaler Place 2 sprays into the nose daily as needed (allergies or congestion).       [name]  zolpidem (AMBIEN) 5 MG tablet Take 1 tablet (5 mg total) by mouth at bedtime as needed for sleep.  20 tablet  0   No current facility-administered medications on file prior to visit.   Active Ambulatory Problems    Diagnosis Date Noted  . DEPRESSION 07/31/2008  . MIGRAINE HEADACHE 07/31/2008  . HYPERTENSION 07/31/2008  . Atrial fibrillation 07/31/2008  . Cervical cancer 07/31/2008  . DVT (deep venous thrombosis) 10/31/2010  . Compression fracture of L3 lumbar vertebra 03/15/2012  . Anemia of chronic disease 07/29/2012  . Diarrhea 07/29/2012  . Protein-calorie malnutrition, severe 07/30/2012  . Osteoporosis with fracture   . GERD (gastroesophageal reflux disease) 11/23/2012  . Atrial fibrillation with RVR 11/28/2012  . Alkaline phosphatase elevation 12/17/2012  . Malnutrition of moderate degree 12/19/2012  . Abdominal pain, unspecified site 04/05/2013  . 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 Ascending cholangitis 05/04/2013  . Sepsis 05/04/2013  . History of DVT (deep vein thrombosis) 05/04/2013  . Gram-negative bacteremia 05/06/2013   Resolved Ambulatory Problems    Diagnosis Date Noted  . ANEMIA-NOS 09/19/2008  . CARPAL TUNNEL SYNDROME, RIGHT 07/31/2008  . GLAUCOMA 07/31/2008  . Other acute otitis externa 08/01/2009  . Orthostatic hypotension 08/15/2008  . UTI 08/01/2008  . Chronic diastolic heart failure 23/55/7322  . Renal atrophy, left   . Cervix cancer 03/26/2011  . Left sided abdominal pain 08/03/2011  . 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 Abscess of groin, right 01/11/2012  . Osteoporosis, unspecified 07/11/2012  . Neutropenic fever 07/29/2012  . Thrombocytopenia 07/29/2012  . Hyponatremia 07/29/2012  . Nausea vomiting and diarrhea 11/23/2012  . Dehydration 11/23/2012  . Hypophosphatemia 05/08/2013  . Hypokalemia 05/08/2013   Past Medical History  Diagnosis Date  . Diastolic dysfunction   . Anemia   . HTN (hypertension)   . Lymphedema   . Hepatic steatosis   . CHF (congestive heart failure)   . Arthritis   . Small kidney   . Radiation 10/11/11-11/19/11  . Radiation 03/16/10-03/25/10  . Radiation 06/11/08-07/23/08  . Hyperlipidemia   . Leg swelling      Review of Systems Fatigue+, mild abd pain due to drains. Isolated fever. Otherwise 10 point ROS is negative    Objective:   Physical Exam BP 123/83  Pulse 86  Ht 5\' 1"  (1.549 m)  Wt 162 lb (73.483 kg)  BMI 30.63 kg/m2 Physical Exam  Constitutional:  oriented to person, place, and time. appears well-developed and well-nourished. No distress.  HENT:  Mouth/Throat: Oropharynx is clear and moist. No oropharyngeal exudate.  Cardiovascular: Normal rate, regular rhythm and normal heart sounds. Exam reveals no gallop and no friction rub.  No murmur heard.  Pulmonary/Chest: Effort normal and breath sounds normal. No  respiratory distress.  has no wheezes.  Abdominal: Soft. Bowel sounds are normal.  exhibits no distension. There is no tenderness. 2 drains in place, medial drain has had little output Lymphadenopathy: no cervical adenopathy.  Neurological: alert and oriented to person, place, and time.  Skin: Skin is warm and dry. No rash noted. No erythema.  Psychiatric: a normal mood and affect.  behavior is normal.         Assessment & Plan:   bacteremia associated with cholangitis = resolved. Will get surveillance blood culture this coming monday. If febrile, return to ed for eval/or cancer ctr vs. ID clinic. If has ongoing fever or recurrent bacteremia, would advocate for removal of port. Will discuss with dr. Humphrey Rolls  upcoming chemo and my concerns about port. It appears that we cleared bacteremia quickly but there is a small risk that there maybe still be nidus of infection with retained port.  rtc in 2-3 wk

## 2013-05-23 ENCOUNTER — Ambulatory Visit (HOSPITAL_COMMUNITY)
Admission: RE | Admit: 2013-05-23 | Discharge: 2013-05-23 | Disposition: A | Discharge status: Home / Self Care | Payer: 59 | Source: Ambulatory Visit | Attending: Adult Health | Admitting: Adult Health

## 2013-05-23 DIAGNOSIS — R599 Enlarged lymph nodes, unspecified: Secondary | ICD-10-CM | POA: Insufficient documentation

## 2013-05-23 DIAGNOSIS — C539 Malignant neoplasm of cervix uteri, unspecified: Secondary | ICD-10-CM | POA: Insufficient documentation

## 2013-05-23 DIAGNOSIS — C787 Secondary malignant neoplasm of liver and intrahepatic bile duct: Secondary | ICD-10-CM | POA: Diagnosis not present

## 2013-05-23 MED ORDER — IOHEXOL 300 MG/ML  SOLN
50.0000 mL | Freq: Once | INTRAMUSCULAR | Status: AC | PRN
  Administered 2013-05-23: 50 mL via ORAL

## 2013-05-23 MED ORDER — IOHEXOL 300 MG/ML  SOLN
100.0000 mL | Freq: Once | INTRAMUSCULAR | Status: AC | PRN
  Administered 2013-05-23: 100 mL via INTRAVENOUS

## 2013-05-25 ENCOUNTER — Ambulatory Visit (HOSPITAL_COMMUNITY): Payer: 59

## 2013-05-28 ENCOUNTER — Encounter: Payer: Self-pay | Admitting: Oncology

## 2013-05-28 ENCOUNTER — Other Ambulatory Visit: Payer: 59

## 2013-05-28 DIAGNOSIS — E860 Dehydration: Secondary | ICD-10-CM

## 2013-05-28 DIAGNOSIS — R7881 Bacteremia: Secondary | ICD-10-CM

## 2013-05-28 NOTE — Progress Notes (Signed)
Put [employer] disability form on nurse's desk °

## 2013-05-29 ENCOUNTER — Telehealth: Payer: Self-pay | Admitting: Adult Health

## 2013-05-29 ENCOUNTER — Telehealth: Payer: Self-pay

## 2013-05-29 ENCOUNTER — Other Ambulatory Visit: Payer: 59 | Admitting: Adult Health

## 2013-05-29 DIAGNOSIS — R7881 Bacteremia: Secondary | ICD-10-CM

## 2013-05-29 NOTE — Telephone Encounter (Signed)
Attempted to call patient to inform her that [place] spoke with Dr. Baxter Flattery and they decided that she needed her port exchanged.  I have ordered this to be done.  I left the patient a message to call us back.    Minette Headland, [place] [phone]

## 2013-05-29 NOTE — Telephone Encounter (Signed)
I spoke with Merry Proud, the PA in radiology and what we agreed on was to remove the port, place a PICC in the interim so the patient can get treatment, and then replace the port a week later.    I attempted to call the patient again and did not get a response. If she calls back, please give her the message that per Dr. Baxter Flattery we are removing the port as it could potentially be infected, she will have a PICC for one week, and then her port will be replaced.    Thanks,  Minette Headland, [place] [phone]

## 2013-05-29 NOTE — Telephone Encounter (Signed)
Pt rcvd call from scheduling prior to hearing from [facility].  Called and let pt know KK planning on removing her port and replacing.  Pt voiced understanding.  Routed to [facility]

## 2013-05-29 NOTE — Telephone Encounter (Signed)
Rcvd Msg from Pt - returning LC's call.  Routed to [facility].  Rcvd Msg from IR [facility] for port exchange for pt for "bacteremia"..  They need to discuss - they will not replace port if infection.  They will be happy to remove.  Called and spoke with Jonelle Sidle - referenced MD Baxter Flattery note from 3/24.  Tech will discuss with radiologist and they will get back to [place] if there is a problem.  Routed to [facility]

## 2013-05-30 ENCOUNTER — Telehealth: Payer: Self-pay | Admitting: *Deleted

## 2013-05-30 ENCOUNTER — Other Ambulatory Visit: Payer: Self-pay

## 2013-05-30 ENCOUNTER — Telehealth: Payer: Self-pay

## 2013-05-30 DIAGNOSIS — C539 Malignant neoplasm of cervix uteri, unspecified: Secondary | ICD-10-CM

## 2013-05-30 MED ORDER — FENTANYL 100 MCG/HR TD PT72: MEDICATED_PATCH | TRANSDERMAL | Status: AC

## 2013-05-30 NOTE — Telephone Encounter (Signed)
Rcvd call from [facility].  PICC placement 06/04/13.  Needs xarelto held Sunday 06/03/13.    LMOVM for [facility] to call back for approval to hold Xarelto 06/03/13.

## 2013-05-30 NOTE — Telephone Encounter (Signed)
Risk versus benefit reviewed As patient needs PICC for ongoing treatment of metastatic cancer and recent infection, feel benefit slightly outweighes by risk of holding Xarelto for 24 hours  Therefore, okay to hold Xarelto night prior to PICC placement, please resume ASAP after procedure complete

## 2013-05-30 NOTE — Telephone Encounter (Signed)
Spoke with patients husband while he was at the clinic to pick up prescription.  Let him know central line plan is to remove current port, place PICC for week to allow for treatment, install another port in one week.  He voiced understanding and said he would let her know.

## 2013-05-30 NOTE — Telephone Encounter (Signed)
Advised pt fentanyl prescription is ready for pickup. Pt voiced understanding.

## 2013-05-30 NOTE — Telephone Encounter (Signed)
[name] from [facility] called requesting whether it is ok for pt to hold her [name] Rx on Sunday 4.5.15 due to PICC placement on 4.6.15.  Please advise

## 2013-05-31 ENCOUNTER — Encounter: Payer: Self-pay | Admitting: Oncology

## 2013-05-31 NOTE — Telephone Encounter (Signed)
Callback msg rcvd from [facility] - ok for Pt to hold Xarelto Sunday prior to procedure.  Pt needs to resume Xarelto as soon as possible after the procedure.

## 2013-05-31 NOTE — Telephone Encounter (Signed)
LMOVM asking for pt callback.

## 2013-05-31 NOTE — Telephone Encounter (Signed)
Left a detailed message on [name]'s VM

## 2013-05-31 NOTE — Progress Notes (Signed)
Faxed disability and clinical information to [place] @ [phone]

## 2013-06-01 ENCOUNTER — Other Ambulatory Visit: Payer: Self-pay | Admitting: Radiology

## 2013-06-01 NOTE — Telephone Encounter (Signed)
Let pt know to hold Xarelto Sunday for Monday procedure.  Resume taking Xarelto as soon as possible after the procedure.  Pt voiced understanding.

## 2013-06-01 NOTE — Telephone Encounter (Signed)
Let Deirdre know that pt was ok to hold Xarelto Sunday for procedure Monday.

## 2013-06-02 ENCOUNTER — Other Ambulatory Visit: Payer: Self-pay

## 2013-06-02 ENCOUNTER — Emergency Department (HOSPITAL_COMMUNITY): Payer: 59

## 2013-06-02 ENCOUNTER — Inpatient Hospital Stay (HOSPITAL_COMMUNITY)
Admission: EM | Admit: 2013-06-02 | Discharge: 2013-06-11 | DRG: 871 | Disposition: A | Discharge status: Hospice | Payer: 59 | Attending: Internal Medicine | Admitting: Internal Medicine

## 2013-06-02 ENCOUNTER — Encounter (HOSPITAL_COMMUNITY): Payer: Self-pay | Admitting: Emergency Medicine

## 2013-06-02 DIAGNOSIS — R627 Adult failure to thrive: Secondary | ICD-10-CM | POA: Diagnosis present

## 2013-06-02 DIAGNOSIS — R600 Localized edema: Secondary | ICD-10-CM

## 2013-06-02 DIAGNOSIS — C787 Secondary malignant neoplasm of liver and intrahepatic bile duct: Secondary | ICD-10-CM | POA: Diagnosis present

## 2013-06-02 DIAGNOSIS — Z515 Encounter for palliative care: Secondary | ICD-10-CM | POA: Diagnosis not present

## 2013-06-02 DIAGNOSIS — N12 Tubulo-interstitial nephritis, not specified as acute or chronic: Secondary | ICD-10-CM | POA: Diagnosis present

## 2013-06-02 DIAGNOSIS — R1110 Vomiting, unspecified: Secondary | ICD-10-CM

## 2013-06-02 DIAGNOSIS — C786 Secondary malignant neoplasm of retroperitoneum and peritoneum: Secondary | ICD-10-CM | POA: Diagnosis present

## 2013-06-02 DIAGNOSIS — I4891 Unspecified atrial fibrillation: Secondary | ICD-10-CM

## 2013-06-02 DIAGNOSIS — R55 Syncope and collapse: Secondary | ICD-10-CM

## 2013-06-02 DIAGNOSIS — G43909 Migraine, unspecified, not intractable, without status migrainosus: Secondary | ICD-10-CM

## 2013-06-02 DIAGNOSIS — R7881 Bacteremia: Secondary | ICD-10-CM

## 2013-06-02 DIAGNOSIS — M8080XA Other osteoporosis with current pathological fracture, unspecified site, initial encounter for fracture: Secondary | ICD-10-CM

## 2013-06-02 DIAGNOSIS — Z90710 Acquired absence of both cervix and uterus: Secondary | ICD-10-CM | POA: Diagnosis not present

## 2013-06-02 DIAGNOSIS — E871 Hypo-osmolality and hyponatremia: Secondary | ICD-10-CM

## 2013-06-02 DIAGNOSIS — C801 Malignant (primary) neoplasm, unspecified: Secondary | ICD-10-CM

## 2013-06-02 DIAGNOSIS — R109 Unspecified abdominal pain: Secondary | ICD-10-CM

## 2013-06-02 DIAGNOSIS — Z801 Family history of malignant neoplasm of trachea, bronchus and lung: Secondary | ICD-10-CM | POA: Diagnosis not present

## 2013-06-02 DIAGNOSIS — Z7901 Long term (current) use of anticoagulants: Secondary | ICD-10-CM | POA: Diagnosis not present

## 2013-06-02 DIAGNOSIS — E876 Hypokalemia: Secondary | ICD-10-CM | POA: Diagnosis not present

## 2013-06-02 DIAGNOSIS — D899 Disorder involving the immune mechanism, unspecified: Secondary | ICD-10-CM | POA: Diagnosis present

## 2013-06-02 DIAGNOSIS — H409 Unspecified glaucoma: Secondary | ICD-10-CM | POA: Diagnosis present

## 2013-06-02 DIAGNOSIS — E440 Moderate protein-calorie malnutrition: Secondary | ICD-10-CM

## 2013-06-02 DIAGNOSIS — C539 Malignant neoplasm of cervix uteri, unspecified: Secondary | ICD-10-CM | POA: Diagnosis present

## 2013-06-02 DIAGNOSIS — Z923 Personal history of irradiation: Secondary | ICD-10-CM

## 2013-06-02 DIAGNOSIS — D630 Anemia in neoplastic disease: Secondary | ICD-10-CM | POA: Diagnosis present

## 2013-06-02 DIAGNOSIS — F3289 Other specified depressive episodes: Secondary | ICD-10-CM

## 2013-06-02 DIAGNOSIS — K5900 Constipation, unspecified: Secondary | ICD-10-CM | POA: Diagnosis present

## 2013-06-02 DIAGNOSIS — E43 Unspecified severe protein-calorie malnutrition: Secondary | ICD-10-CM

## 2013-06-02 DIAGNOSIS — R531 Weakness: Secondary | ICD-10-CM

## 2013-06-02 DIAGNOSIS — Z66 Do not resuscitate: Secondary | ICD-10-CM | POA: Diagnosis not present

## 2013-06-02 DIAGNOSIS — D638 Anemia in other chronic diseases classified elsewhere: Secondary | ICD-10-CM

## 2013-06-02 DIAGNOSIS — I10 Essential (primary) hypertension: Secondary | ICD-10-CM

## 2013-06-02 DIAGNOSIS — K838 Other specified diseases of biliary tract: Secondary | ICD-10-CM | POA: Diagnosis present

## 2013-06-02 DIAGNOSIS — E46 Unspecified protein-calorie malnutrition: Secondary | ICD-10-CM | POA: Diagnosis present

## 2013-06-02 DIAGNOSIS — E861 Hypovolemia: Secondary | ICD-10-CM | POA: Diagnosis present

## 2013-06-02 DIAGNOSIS — Z6828 Body mass index (BMI) 28.0-28.9, adult: Secondary | ICD-10-CM

## 2013-06-02 DIAGNOSIS — R599 Enlarged lymph nodes, unspecified: Secondary | ICD-10-CM | POA: Diagnosis present

## 2013-06-02 DIAGNOSIS — R748 Abnormal levels of other serum enzymes: Secondary | ICD-10-CM | POA: Diagnosis present

## 2013-06-02 DIAGNOSIS — K219 Gastro-esophageal reflux disease without esophagitis: Secondary | ICD-10-CM

## 2013-06-02 DIAGNOSIS — Z9221 Personal history of antineoplastic chemotherapy: Secondary | ICD-10-CM | POA: Diagnosis not present

## 2013-06-02 DIAGNOSIS — K831 Obstruction of bile duct: Secondary | ICD-10-CM | POA: Diagnosis present

## 2013-06-02 DIAGNOSIS — I951 Orthostatic hypotension: Secondary | ICD-10-CM | POA: Diagnosis present

## 2013-06-02 DIAGNOSIS — Z825 Family history of asthma and other chronic lower respiratory diseases: Secondary | ICD-10-CM

## 2013-06-02 DIAGNOSIS — Z87891 Personal history of nicotine dependence: Secondary | ICD-10-CM

## 2013-06-02 DIAGNOSIS — R112 Nausea with vomiting, unspecified: Secondary | ICD-10-CM | POA: Diagnosis present

## 2013-06-02 DIAGNOSIS — I82409 Acute embolism and thrombosis of unspecified deep veins of unspecified lower extremity: Secondary | ICD-10-CM

## 2013-06-02 DIAGNOSIS — Z86718 Personal history of other venous thrombosis and embolism: Secondary | ICD-10-CM

## 2013-06-02 DIAGNOSIS — E785 Hyperlipidemia, unspecified: Secondary | ICD-10-CM | POA: Diagnosis present

## 2013-06-02 DIAGNOSIS — B999 Unspecified infectious disease: Secondary | ICD-10-CM

## 2013-06-02 DIAGNOSIS — K8309 Other cholangitis: Secondary | ICD-10-CM | POA: Diagnosis present

## 2013-06-02 DIAGNOSIS — I890 Lymphedema, not elsewhere classified: Secondary | ICD-10-CM | POA: Diagnosis present

## 2013-06-02 DIAGNOSIS — A419 Sepsis, unspecified organism: Principal | ICD-10-CM

## 2013-06-02 DIAGNOSIS — F329 Major depressive disorder, single episode, unspecified: Secondary | ICD-10-CM

## 2013-06-02 DIAGNOSIS — Z823 Family history of stroke: Secondary | ICD-10-CM | POA: Diagnosis not present

## 2013-06-02 DIAGNOSIS — R197 Diarrhea, unspecified: Secondary | ICD-10-CM

## 2013-06-02 DIAGNOSIS — G893 Neoplasm related pain (acute) (chronic): Secondary | ICD-10-CM

## 2013-06-02 DIAGNOSIS — S32030A Wedge compression fracture of third lumbar vertebra, initial encounter for closed fracture: Secondary | ICD-10-CM

## 2013-06-02 DIAGNOSIS — R509 Fever, unspecified: Secondary | ICD-10-CM

## 2013-06-02 DIAGNOSIS — N279 Small kidney, unspecified: Secondary | ICD-10-CM | POA: Diagnosis present

## 2013-06-02 DIAGNOSIS — Z79899 Other long term (current) drug therapy: Secondary | ICD-10-CM | POA: Diagnosis not present

## 2013-06-02 DIAGNOSIS — N390 Urinary tract infection, site not specified: Secondary | ICD-10-CM

## 2013-06-02 HISTORY — DX: Malignant (primary) neoplasm, unspecified: C80.1

## 2013-06-02 HISTORY — DX: Bacteremia: R78.81

## 2013-06-02 HISTORY — DX: Other cholangitis: K83.09

## 2013-06-02 HISTORY — DX: Obstruction of bile duct: K83.1

## 2013-06-02 LAB — URINALYSIS, ROUTINE W REFLEX MICROSCOPIC
Glucose, UA: NEGATIVE mg/dL
Hgb urine dipstick: NEGATIVE
Ketones, ur: NEGATIVE mg/dL
Nitrite: POSITIVE — AB
Protein, ur: 30 mg/dL — AB
Specific Gravity, Urine: 1.028 (ref 1.005–1.030)
Urobilinogen, UA: 1 mg/dL (ref 0.0–1.0)
pH: 6 (ref 5.0–8.0)

## 2013-06-02 LAB — CBC WITH DIFFERENTIAL/PLATELET
Basophils Absolute: 0 10*3/uL (ref 0.0–0.1)
Basophils Relative: 0 % (ref 0–1)
Eosinophils Absolute: 0 10*3/uL (ref 0.0–0.7)
Eosinophils Relative: 0 % (ref 0–5)
HCT: 32.3 % — ABNORMAL LOW (ref 36.0–46.0)
Hemoglobin: 10.4 g/dL — ABNORMAL LOW (ref 12.0–15.0)
Lymphocytes Relative: 9 % — ABNORMAL LOW (ref 12–46)
Lymphs Abs: 0.9 10*3/uL (ref 0.7–4.0)
MCH: 27.3 pg (ref 26.0–34.0)
MCHC: 32.2 g/dL (ref 30.0–36.0)
MCV: 84.8 fL (ref 78.0–100.0)
Monocytes Absolute: 0.9 10*3/uL (ref 0.1–1.0)
Monocytes Relative: 9 % (ref 3–12)
Neutro Abs: 8.2 10*3/uL — ABNORMAL HIGH (ref 1.7–7.7)
Neutrophils Relative %: 82 % — ABNORMAL HIGH (ref 43–77)
Platelets: 369 10*3/uL (ref 150–400)
RBC: 3.81 MIL/uL — ABNORMAL LOW (ref 3.87–5.11)
RDW: 15.3 % (ref 11.5–15.5)
WBC: 10 10*3/uL (ref 4.0–10.5)

## 2013-06-02 LAB — COMPREHENSIVE METABOLIC PANEL
ALT: 16 U/L (ref 0–35)
AST: 28 U/L (ref 0–37)
Albumin: 2 g/dL — ABNORMAL LOW (ref 3.5–5.2)
Alkaline Phosphatase: 918 U/L — ABNORMAL HIGH (ref 39–117)
BUN: 11 mg/dL (ref 6–23)
CO2: 23 mEq/L (ref 19–32)
Calcium: 10.8 mg/dL — ABNORMAL HIGH (ref 8.4–10.5)
Chloride: 90 mEq/L — ABNORMAL LOW (ref 96–112)
Creatinine, Ser: 0.65 mg/dL (ref 0.50–1.10)
GFR calc Af Amer: >90 mL/min (ref >90)
GFR calc non Af Amer: >90 mL/min (ref >90)
Glucose, Bld: 108 mg/dL — ABNORMAL HIGH (ref 70–99)
Potassium: 3.4 mEq/L — ABNORMAL LOW (ref 3.7–5.3)
Sodium: 126 mEq/L — ABNORMAL LOW (ref 137–147)
Total Bilirubin: 2.6 mg/dL — ABNORMAL HIGH (ref 0.3–1.2)
Total Protein: 7.5 g/dL (ref 6.0–8.3)

## 2013-06-02 LAB — URINE MICROSCOPIC-ADD ON

## 2013-06-02 LAB — I-STAT CG4 LACTIC ACID, ED: Lactic Acid, Venous: 1.82 mmol/L (ref 0.5–2.2)

## 2013-06-02 LAB — LIPASE, BLOOD: Lipase: 8 U/L — ABNORMAL LOW (ref 11–59)

## 2013-06-02 LAB — TROPONIN I: Troponin I: <0.3 ng/mL (ref <0.30)

## 2013-06-02 MED ORDER — ONDANSETRON HCL 4 MG/2ML IJ SOLN
4.0000 mg | Freq: Four times a day (QID) | INTRAMUSCULAR | Status: DC | PRN
  Administered 2013-06-03 – 2013-06-05 (×2): 4 mg via INTRAVENOUS
  Filled 2013-06-02 (×4): qty 2

## 2013-06-02 MED ORDER — LORAZEPAM 1 MG PO TABS
1.0000 mg | ORAL_TABLET | Freq: Four times a day (QID) | ORAL | Status: DC | PRN
  Administered 2013-06-06: 1 mg via ORAL
  Filled 2013-06-02: qty 1

## 2013-06-02 MED ORDER — DILTIAZEM HCL ER COATED BEADS 180 MG PO CP24
180.0000 mg | ORAL_CAPSULE | Freq: Every day | ORAL | Status: DC
  Filled 2013-06-02: qty 1

## 2013-06-02 MED ORDER — METOPROLOL TARTRATE 12.5 MG HALF TABLET
12.5000 mg | ORAL_TABLET | Freq: Two times a day (BID) | ORAL | Status: DC
  Administered 2013-06-03 – 2013-06-06 (×7): 12.5 mg via ORAL
  Filled 2013-06-02 (×8): qty 1

## 2013-06-02 MED ORDER — PANTOPRAZOLE SODIUM 40 MG IV SOLR
40.0000 mg | INTRAVENOUS | Status: DC
  Administered 2013-06-02 – 2013-06-03 (×2): 40 mg via INTRAVENOUS
  Filled 2013-06-02 (×3): qty 40

## 2013-06-02 MED ORDER — ACETAMINOPHEN 325 MG PO TABS: 650.0000 mg | ORAL_TABLET | Freq: Four times a day (QID) | ORAL | Status: DC | PRN

## 2013-06-02 MED ORDER — FENTANYL 100 MCG/HR TD PT72
200.0000 ug | MEDICATED_PATCH | TRANSDERMAL | Status: DC
  Administered 2013-06-03 – 2013-06-09 (×3): 200 ug via TRANSDERMAL
  Filled 2013-06-02 (×3): qty 2

## 2013-06-02 MED ORDER — SALINE SPRAY 0.65 % NA SOLN
1.0000 | Freq: Every day | NASAL | Status: DC | PRN
  Filled 2013-06-02: qty 44

## 2013-06-02 MED ORDER — CEFTRIAXONE SODIUM 1 G IJ SOLR
1.0000 g | INTRAMUSCULAR | Status: DC
  Administered 2013-06-02: 1 g via INTRAVENOUS
  Filled 2013-06-02 (×2): qty 10

## 2013-06-02 MED ORDER — ZOLPIDEM TARTRATE 5 MG PO TABS: 5.0000 mg | ORAL_TABLET | Freq: Every evening | ORAL | Status: DC | PRN

## 2013-06-02 MED ORDER — SODIUM CHLORIDE 0.9 % IV BOLUS (SEPSIS)
1000.0000 mL | Freq: Once | INTRAVENOUS | Status: AC
  Administered 2013-06-02: 1000 mL via INTRAVENOUS

## 2013-06-02 MED ORDER — POTASSIUM CHLORIDE IN NACL 20-0.9 MEQ/L-% IV SOLN
INTRAVENOUS | Status: DC
  Filled 2013-06-02 (×2): qty 1000

## 2013-06-02 MED ORDER — HYDROMORPHONE HCL PF 1 MG/ML IJ SOLN
0.5000 mg | INTRAMUSCULAR | Status: DC | PRN
  Administered 2013-06-02 – 2013-06-06 (×13): 1 mg via INTRAVENOUS
  Filled 2013-06-02 (×13): qty 1

## 2013-06-02 MED ORDER — ALBUTEROL SULFATE (2.5 MG/3ML) 0.083% IN NEBU: 2.5000 mg | INHALATION_SOLUTION | RESPIRATORY_TRACT | Status: DC | PRN

## 2013-06-02 MED ORDER — LIDOCAINE-PRILOCAINE 2.5-2.5 % EX CREA
1.0000 | TOPICAL_CREAM | Freq: Every day | CUTANEOUS | Status: DC | PRN
Start: 2013-06-02 — End: 2013-06-11
  Filled 2013-06-02: qty 5

## 2013-06-02 MED ORDER — LORATADINE 10 MG PO TABS
10.0000 mg | ORAL_TABLET | Freq: Every day | ORAL | Status: DC
  Administered 2013-06-02 – 2013-06-11 (×10): 10 mg via ORAL
  Filled 2013-06-02 (×10): qty 1

## 2013-06-02 MED ORDER — BISACODYL 5 MG PO TBEC
5.0000 mg | DELAYED_RELEASE_TABLET | Freq: Every day | ORAL | Status: DC | PRN
  Administered 2013-06-06 – 2013-06-07 (×3): 5 mg via ORAL
  Filled 2013-06-02 (×5): qty 1

## 2013-06-02 MED ORDER — ALUM & MAG HYDROXIDE-SIMETH 200-200-20 MG/5ML PO SUSP: 30.0000 mL | Freq: Four times a day (QID) | ORAL | Status: DC | PRN

## 2013-06-02 MED ORDER — DOCUSATE SODIUM 100 MG PO CAPS
100.0000 mg | ORAL_CAPSULE | Freq: Two times a day (BID) | ORAL | Status: DC
  Administered 2013-06-03 – 2013-06-07 (×9): 100 mg via ORAL
  Filled 2013-06-02 (×10): qty 1

## 2013-06-02 MED ORDER — POTASSIUM CHLORIDE IN NACL 40-0.9 MEQ/L-% IV SOLN
INTRAVENOUS | Status: DC
  Administered 2013-06-02: 100 mL/h via INTRAVENOUS
  Administered 2013-06-03 – 2013-06-05 (×5): via INTRAVENOUS
  Filled 2013-06-02 (×9): qty 1000

## 2013-06-02 MED ORDER — VANCOMYCIN HCL IN DEXTROSE 1-5 GM/200ML-% IV SOLN
1000.0000 mg | Freq: Two times a day (BID) | INTRAVENOUS | Status: DC
  Administered 2013-06-02: 1000 mg via INTRAVENOUS
  Filled 2013-06-02 (×3): qty 200

## 2013-06-02 MED ORDER — FLUTICASONE PROPIONATE 50 MCG/ACT NA SUSP
2.0000 | Freq: Every day | NASAL | Status: DC | PRN
  Filled 2013-06-02: qty 16

## 2013-06-02 MED ORDER — ACETAMINOPHEN 650 MG RE SUPP: 650.0000 mg | Freq: Four times a day (QID) | RECTAL | Status: DC | PRN

## 2013-06-02 MED ORDER — HEPARIN (PORCINE) IN NACL 100-0.45 UNIT/ML-% IJ SOLN
1200.0000 [IU]/h | INTRAMUSCULAR | Status: DC
  Administered 2013-06-03: 1050 [IU]/h via INTRAVENOUS
  Administered 2013-06-03: 1200 [IU]/h via INTRAVENOUS
  Filled 2013-06-02 (×2): qty 250

## 2013-06-02 MED ORDER — ONDANSETRON HCL 4 MG PO TABS
4.0000 mg | ORAL_TABLET | Freq: Four times a day (QID) | ORAL | Status: DC | PRN
  Administered 2013-06-06: 4 mg via ORAL

## 2013-06-02 NOTE — H&P (Addendum)
[facility] History and Physical  KEHAULANI FRUIN VHQ:[id number] DOB: 1951/02/10 DOA: 06/02/2013  Referring physician: ED PA Mr. Lawyer PCP: Gwendolyn Grant, MD  Oncologist, Dr. Humphrey Rolls Infectious diseases, Dr. Baxter Flattery  Chief Complaint: Generalized weakness, fever, nausea, and vomiting; passed out yesterday.  HPI: [name] is a 63 y.o. female with a history of metastatic cervical cancer since 2005 -status post total hysterectomy with recurrence in 2010-status post radiation treatment with metastatic spread to the liver since 2012; biliary obstruction-status post stenting with drain placement by Dr. Kathlene Cote on the left and the right in February 2015; chronic atrial fibrillation; and hypertension. She was recently hospitalized from 05/04/13 through 05/15/13 for sepsis with gram-negative bacteremia and ascending cholangitis. She completed the course of outpatient Cipro and fluconazole. She presents with generalized weakness, generalized anorexia, nausea, and 1 episode of vomiting. Her symptoms started 3-4 days ago and have progressed. She had a fever of 101.7 last night. She has had nausea intermittently, but had one episode of large emesis this morning. Since that time, she has had no further vomiting. She has chronic abdominal pain which is no worse than usual. She denies diarrhea. Normally, she has only a couple bowel movements per week. Yesterday, while she was standing at the kitchen counter, she became lightheaded and collapsed to the floor. There was no apparent head trauma as witnessed by her sister, Mrs. Domenic Polite. The patient apparently lost consciousness for only a few seconds before coming to. She denies pain with urination, chest pain, chest congestion, but has intermittent shortness of breath.  In the emergency department, she is afebrile and hemodynamically stable, although her blood pressure is on the low end of normal at 96/68. Her lab data are significant for normal WBC, normal  lipase, hemoglobin of 10.4, sodium of 126, potassium of 3.4, and calcium of 10.9. Her abdominal x-ray reveals unremarkable bowel gas pattern and no free intra-abdominal air. Her chest x-ray reveals small right pleural effusion, otherwise no evidence of focal infiltrates. Her urinalysis reveals positive nitrite and small leukocytes. She is being admitted for further evaluation and management.    Review of Systems:  As above in history present illness. In addition, she has chronic abdominal pain. Otherwise review of systems is negative.  Past Medical History  Diagnosis Date  . Diastolic dysfunction   . MIGRAINE HEADACHE   . Atrial fibrillation     failed DCCV, CHADs2-prev pradaxa stopped due to hematuria with ureter issues/JJ   . Anemia   . HTN (hypertension)   . GLAUCOMA   . CARPAL TUNNEL SYNDROME, RIGHT   . Lymphedema     L>R leg from pelvic XRT/scarring  . Hepatic steatosis     CT scan 06/2009, 12/2009  . DVT (deep venous thrombosis) 10/2010    LLE, anticoag resumed  . CHF (congestive heart failure)     1 yr ago per pt  . Arthritis   . GERD (gastroesophageal reflux disease)   . Small kidney     left  . Radiation 10/11/11-11/19/11    5040 cGy in 28 fx's/periaortic  . Radiation 03/16/10-03/25/10    right inguinal node/left external iliac node  . Radiation 06/11/08-07/23/08    6000 cGy left pelvis  . Hyperlipidemia   . Leg swelling     left  . Cervical cancer dx 04/2003, recur 04/2008    s/p debulk (TAHBSO), chemo/XRT; recurrent dz L pelvis dx 2010 and liver met 09/2010 s/p RFA  . Osteoporosis with fracture 2014    compression  fx, s/p KP ; started prolia 06/2012- narcotic pain mgmt  . Ascending cholangitis 05/04/2013  . Gram-negative bacteremia 05/06/2013  . 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 Ureteral stent placement  2005, 01/2010, 07/2010    L hydro related to cervical ca and XRT  . Oophorectomy  2005  . Cardioversion    .  Tonsillectomy    . Cholecystectomy  1984  . Total abdominal hysterectomy  2005  . Carpal tunnel  2009    right  . Ivc filter  04/2011   Social History: she is single. She has one son. She has a long-term "boyfriend", Mr. Midge Aver. She lives and aspirin. She stopped smoking 29 years ago. She drinks alcohol on a rare occasion. She is currently disabled. No use of illicit drugs.   Allergies  Allergen Reactions  . Codeine Other (See Comments)    Breathing issues  . Flecainide Acetate Other (See Comments)    Leg cramping   . Morphine Other (See Comments)     Hallucinations  . Propafenone Hcl Other (See Comments)    Leg cramps    Family History  Problem Relation Age of Onset  . Lung cancer    . Hypertension    . Heart disease    . Stroke    . Asthma    . Asthma Son   . Cancer Mother     lung  . Stroke Father      Prior to Admission medications   Medication Sig Start Date End Date Taking? Authorizing Provider  bisacodyl (DULCOLAX) 5 MG EC tablet Take 5 mg by mouth daily as needed for constipation.   Yes Historical Provider, MD  cetirizine (ZYRTEC) 10 MG tablet Take 10 mg by mouth daily as needed for allergies.    Yes Historical Provider, MD  diltiazem (CARDIZEM CD) 180 MG 24 hr capsule Take 1 capsule (180 mg total) by mouth daily. TAke 2-3 hours before of after Metoprolol 05/21/13  Yes Rowe Clack, MD  docusate sodium (COLACE) 100 MG capsule Take 1 capsule (100 mg total) by mouth 2 (two) times daily. 01/23/13  Yes Vivien Rota, NP  ferrous fumarate (HEMOCYTE - 106 MG FE) 325 (106 FE) MG TABS Take 1 tablet (106 mg of iron total) by mouth daily. 08/04/12  Yes Monika Salk, MD  folic acid (FOLVITE) 1 MG tablet Take 1 tablet (1 mg total) by mouth every morning. 04/19/13  Yes Deatra Robinson, MD  HYDROmorphone (DILAUDID) 4 MG tablet Take 1 tablet (4 mg total) by mouth every 2 (two) hours as needed for severe pain. 05/15/13  Yes Estela Leonie Green, MD  ibuprofen (ADVIL,MOTRIN)  200 MG tablet Take 600 mg by mouth every 4 (four) hours as needed. Pain   Yes Historical Provider, MD  lidocaine-prilocaine (EMLA) cream Apply 1 application topically daily as needed (for port access). 02/08/13  Yes Deatra Robinson, MD  LORazepam (ATIVAN) 1 MG tablet Take 1 tablet (1 mg total) by mouth every 6 (six) hours as needed (for nausea (may put under tongue)). 11/16/12  Yes Deatra Robinson, MD  metoprolol (LOPRESSOR) 50 MG tablet Take 1 tablet (50 mg total) by mouth 2 (two) times daily. 05/15/13  Yes Estela Leonie Green, MD  Nutritional Supplements (CARNATION INSTANT BREAKFAST PO) Take 1 Can by mouth 4 (four) times daily as needed (for meals).   Yes Historical Provider, MD  ondansetron (ZOFRAN) 8 MG tablet Take  1 tablet (8 mg total) by mouth every 8 (eight) hours as needed for nausea. 11/16/12  Yes Deatra Robinson, MD  pantoprazole (PROTONIX) 40 MG tablet Take 1 tablet (40 mg total) by mouth daily. 03/23/13  Yes Minette Headland, NP  potassium chloride (KLOR-CON) 8 MEQ tablet Take 8 mEq by mouth 2 (two) times daily.    Yes Historical Provider, MD  Rivaroxaban (XARELTO) 20 MG TABS tablet Take 1 tablet (20 mg total) by mouth every evening. 03/29/13  Yes Rowe Clack, MD  sodium chloride (OCEAN) 0.65 % nasal spray Place 1 spray into the nose daily as needed for congestion. Allergies   Yes Historical Provider, MD  triamcinolone (NASACORT) 55 MCG/ACT nasal inhaler Place 2 sprays into the nose daily as needed (allergies or congestion).    Yes Historical Provider, MD  zolpidem (AMBIEN) 5 MG tablet Take 1 tablet (5 mg total) by mouth at bedtime as needed for sleep. 02/14/13  Yes Minette Headland, NP  fentaNYL (DURAGESIC - DOSED MCG/HR) 100 MCG/HR Apply 2 patches every 72 hours (total of 200 mcg) 05/30/13   Deatra Robinson, MD   Physical Exam: Filed Vitals:   06/02/13 1903  BP: 102/74  Pulse: 115  Temp:   Resp:     BP 102/74  Pulse 115  Temp(Src) 98.1 F (36.7 C) (Oral)  Resp 16  Ht  _0  (1.549 m)  Wt 73.483 kg (162 lb)  BMI 30.63 kg/m2  SpO2 99%  General: Pleasant ill-appearing 63 year old Caucasian woman laying in bed, in no acute distress.  Eyes: PERRL, normal lids, irises & conjunctiva ENT: grossly normal hearing; mucous membranes mildly dry. No posterior exudates or erythema.  Neck: no LAD, masses or thyromegaly Cardiovascular: irregular, irregular. Chronic 2+ nonpitting left lower extremity edema. No right lower extremity edema. Pedal pulses palpable. Chest wall palpable Port-A-Cath without surrounding erythema or tenderness. Telemetry: irregular irregular   Respiratory: CTA bilaterally, no w/r/r. Normal respiratory effort. Abdomen: right flank drain and hypogastric drain noted draining bilious fluid. Abdomen mildly distended, positive bowel sounds, globally mildly tender.  Skin: no rash or induration seen on limited exam Musculoskeletal: grossly normal tone BUE/BLE Psychiatric: grossly normal mood and affect, speech fluent and appropriate Neurologic: grossly non-focal.          Labs on Admission:  Basic Metabolic Panel:  Recent Labs Lab 06/02/13 1645  NA 126*  K 3.4*  CL 90*  CO2 23  GLUCOSE 108*  BUN 11  CREATININE 0.65  CALCIUM 10.8*   Liver Function Tests:  Recent Labs Lab 06/02/13 1645  AST 28  ALT 16  ALKPHOS 918*  BILITOT 2.6*  PROT 7.5  ALBUMIN 2.0*    Recent Labs Lab 06/02/13 1645  LIPASE 8*   No results found for this basename: AMMONIA,  in the last 168 hours CBC:  Recent Labs Lab 06/02/13 1645  WBC 10.0  NEUTROABS 8.2*  HGB 10.4*  HCT 32.3*  MCV 84.8  PLT 369   Cardiac Enzymes:  Recent Labs Lab 06/02/13 1645  TROPONINI <0.30    BNP (last 3 results) No results found for this basename: PROBNP,  in the last 8760 hours CBG: No results found for this basename: GLUCAP,  in the last 168 hours  Radiological Exams on Admission: Dg Chest 2 View  06/02/2013   CLINICAL DATA:  weakness and fever  EXAM: CHEST   2 VIEW  COMPARISON:  CT CHEST W/CM dated 05/23/2013; DG CHEST 2 VIEW dated 05/04/2013  FINDINGS:  Low lung volumes. Small right pleural effusion. Aerated lungs are clear. Cardiac silhouette enlarged. Right-sided porta catheter with tip projected regions superior vena cava. No gross osseous abnormalities.  IMPRESSION: Small right pleural effusion otherwise no evidence of focal infiltrates.   Electronically Signed   By: Margaree Mackintosh M.D.   On: 06/02/2013 17:07   Dg Abd 2 Views  06/02/2013   CLINICAL DATA:  Vomiting, fever and chills. Right-sided abdominal pain.  EXAM: ABDOMEN - 2 VIEW  COMPARISON:  Abdominal radiograph performed 11/23/2012, and CT of the abdomen and pelvis performed 05/23/2013  FINDINGS: The patient's two percutaneous biliary drainage catheters are again noted. The visualized bowel gas pattern is unremarkable. Scattered air and stool filled loops of colon are seen; no abnormal dilatation of small bowel loops is seen to suggest small bowel obstruction. No free intra-abdominal air is identified, though evaluation for free air is limited on a single supine view. Scattered clips are noted in the right upper quadrant. A chest port is partially imaged.  Changes of prior vertebroplasty are noted at multiple levels along the lumbar spine; the sacroiliac joints are unremarkable in appearance. The visualized lung bases are essentially clear.  IMPRESSION: Unremarkable bowel gas pattern; no free intra-abdominal air seen. Percutaneous biliary drainage catheters are unchanged in position.   Electronically Signed   By: Garald Balding M.D.   On: 06/02/2013 21:16    EKG: Independently reviewed. Atrial fibrillation; heart rate 92 beats per minute  Assessment/Plan Principal Problem:   Nausea and vomiting Active Problems:   Malignant obstructive jaundice   Orthostatic hypotension   UTI (urinary tract infection)   Hyponatremia   Syncope and collapse   Atrial fibrillation   Cervical cancer   Anemia of  chronic disease   Alkaline phosphatase elevation   Fever and chills   Leg edema, left   1. Nausea and vomiting. Etiology unclear, but will consider urinary tract infection, sequelae of malignant obstructive jaundice; advanced cervical cancer. Her lipase and liver transaminases are unremarkable. She denies nausea currently. We'll treat her with as needed Zofran and Protonix IV. 2. Reported syncope and collapse yesterday. There are no focal neurological findings on exam. She was noted to be orthostatic in the emergency department. This is likely the etiology of her syncope. We will hydrate her vigorously and will recheck orthostatic vital signs tomorrow morning. 3. Hyponatremia, likely secondary to hypovolemia. Will hydrate her with normal saline. 4. Mild hypokalemia. We'll add potassium to the IV fluids and monitor closely. We'll hold off on oral potassium for now. 5. Possible urinary tract infection. The patient denies dysuria, but given her symptomatology and immunocompromise state, would favor starting antibiotics. We'll start Rocephin and vancomycin. Urine culture has been ordered. 6. Metastatic cervical cancer. She is followed by Dr. Humphrey Rolls. According to the patient, a new round of chemotherapy is being considered in the next couple of weeks. I will add Dr. Laurelyn Sickle name to the treatment team. 7. Persistent fever and chills. The patient is afebrile currently. However, per ID, Dr. Baxter Flattery, the Port-A-Cath is scheduled to be taken out by interventional radiology on 06/04/13 and a subsequent PICC line will be placed. Followup blood cultures ordered on 05/28/13 by Dr. Baxter Flattery currently reveals no growth. We'll start vancomycin empirically. Interventional radiology will need to be notified of the patient's admission and coordinate removal of Port-A-Cath. 8. Recent history of ascending cholangitis and malignant obstructive jaundice. Status post biliary drains. These are stable. Per the patient and family, the  drains will need  to be exchanged by interventional radiology sometime next week. This should be discussed with interventional radiology over the next day or 2. 9. Chronic atrial fibrillation. Her rate is controlled. She is treated chronically with diltiazem and metoprolol. Given her soft blood pressures, will decrease the dose of metoprolol to 12.5 mg twice a day. Continue same dosing of diltiazem. Will start heparin per pharmacy. Heparin should be stopped Sunday night/Monday morning in anticipation of the removal of the Port-A-Cath by interventional radiology. Prior to the hospitalization, she was told to hold Xarelto Sunday.    Code Status: Full code Family Communication: discuss with her significant other, Mr. Midge Aver and sister, Mrs. Domenic Polite. Disposition Plan: To be determined.  Time spent: 1 hour 15 minutes.  [facility] Pager [phone]

## 2013-06-02 NOTE — ED Provider Notes (Signed)
CSN: [id number]     Arrival date & time 06/02/13  1556 History   First MD Initiated Contact with Patient 06/02/13 1603     Chief Complaint  Patient presents with  . Weakness     (Consider location/radiation/quality/duration/timing/severity/associated sxs/prior Treatment) HPI Patient presents emergency department with generalized weakness and syncope.  Patient, states she passed out yesterday.  She currently being treated for metastatic lung cancer.  Patient, states, that she's had low-grade fever, with one episode of vomiting.  She is complaining of shortness of breath, or dizziness.  She was also, recently admitted for sepsis, and they felt her port may be infected.     Past Medical History  Diagnosis Date  . Diastolic dysfunction   . MIGRAINE HEADACHE   . Atrial fibrillation     failed DCCV, CHADs2-prev pradaxa stopped due to hematuria with ureter issues/JJ   . Anemia   . HTN (hypertension)   . GLAUCOMA   . CARPAL TUNNEL SYNDROME, RIGHT   . Lymphedema     L>R leg from pelvic XRT/scarring  . Hepatic steatosis     CT scan 06/2009, 12/2009  . DVT (deep venous thrombosis) 10/2010    LLE, anticoag resumed  . CHF (congestive heart failure)     1 yr ago per pt  . Arthritis   . GERD (gastroesophageal reflux disease)   . Small kidney     left  . Radiation 10/11/11-11/19/11    5040 cGy in 28 fx's/periaortic  . Radiation 03/16/10-03/25/10    right inguinal node/left external iliac node  . Radiation 06/11/08-07/23/08    6000 cGy left pelvis  . Hyperlipidemia   . Leg swelling     left  . Cervical cancer dx 04/2003, recur 04/2008    s/p debulk (TAHBSO), chemo/XRT; recurrent dz L pelvis dx 2010 and liver met 09/2010 s/p RFA  . Osteoporosis with fracture 2014    compression fx, s/p KP ; started prolia 06/2012- narcotic pain mgmt  . Ascending cholangitis 05/04/2013  . Gram-negative bacteremia 05/06/2013  . 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 Ureteral stent placement  2005, 01/2010, 07/2010    L hydro related to cervical ca and XRT  . Oophorectomy  2005  . Cardioversion    . Tonsillectomy    . Cholecystectomy  1984  . Total abdominal hysterectomy  2005  . Carpal tunnel  2009    right  . Ivc filter  04/2011   Family History  Problem Relation Age of Onset  . Lung cancer    . Hypertension    . Heart disease    . Stroke    . Asthma    . Asthma Son   . Cancer Mother     lung  . Stroke Father    History  Substance Use Topics  . Smoking status: Former Smoker -- 0.50 packs/day for 3 years    Quit date: 03/01/1984  . Smokeless tobacco: Never Used  . Alcohol Use: No   OB History   Grav Para Term Preterm Abortions TAB SAB Ect Mult Living                 Review of Systems   All other systems negative except as documented in the HPI. All pertinent positives and negatives as reviewed in the HPI. Allergies  Codeine; Flecainide acetate; Morphine; and Propafenone hcl  Home Medications   Current Outpatient Rx  Name  Route  Sig  Dispense  Refill  . bisacodyl (DULCOLAX) 5 MG EC tablet   Oral   Take 5 mg by mouth daily as needed for constipation.         . cetirizine (ZYRTEC) 10 MG tablet   Oral   Take 10 mg by mouth daily as needed for allergies.          [name] diltiazem (CARDIZEM CD) 180 MG 24 hr capsule   Oral   Take 1 capsule (180 mg total) by mouth daily. TAke 2-3 hours before of after Metoprolol   30 capsule   5   . docusate sodium (COLACE) 100 MG capsule   Oral   Take 1 capsule (100 mg total) by mouth 2 (two) times daily.   60 capsule   1   . ferrous fumarate (HEMOCYTE - 106 MG FE) 325 (106 FE) MG TABS   Oral   Take 1 tablet (106 mg of iron total) by mouth daily.   30 each   0   . folic acid (FOLVITE) 1 MG tablet   Oral   Take 1 tablet (1 mg total) by mouth every morning.   30 tablet   6   . HYDROmorphone (DILAUDID) 4 MG tablet   Oral   Take 1 tablet (4 mg total) by mouth every 2  (two) hours as needed for severe pain.   45 tablet   0   . ibuprofen (ADVIL,MOTRIN) 200 MG tablet   Oral   Take 600 mg by mouth every 4 (four) hours as needed. Pain         . lidocaine-prilocaine (EMLA) cream   Topical   Apply 1 application topically daily as needed (for port access).   30 g   6   . LORazepam (ATIVAN) 1 MG tablet   Oral   Take 1 tablet (1 mg total) by mouth every 6 (six) hours as needed (for nausea (may put under tongue)).   30 tablet   2   . metoprolol (LOPRESSOR) 50 MG tablet   Oral   Take 1 tablet (50 mg total) by mouth 2 (two) times daily.         . Nutritional Supplements (CARNATION INSTANT BREAKFAST PO)   Oral   Take 1 Can by mouth 4 (four) times daily as needed (for meals).         . ondansetron (ZOFRAN) 8 MG tablet   Oral   Take 1 tablet (8 mg total) by mouth every 8 (eight) hours as needed for nausea.   30 tablet   2   . pantoprazole (PROTONIX) 40 MG tablet   Oral   Take 1 tablet (40 mg total) by mouth daily.   30 tablet   2   . potassium chloride (KLOR-CON) 8 MEQ tablet   Oral   Take 8 mEq by mouth 2 (two) times daily.          . Rivaroxaban (XARELTO) 20 MG TABS tablet   Oral   Take 1 tablet (20 mg total) by mouth every evening.   30 tablet   5   . sodium chloride (OCEAN) 0.65 % nasal spray   Nasal   Place 1 spray into the nose daily as needed for congestion. Allergies         . triamcinolone (NASACORT) 55 MCG/ACT nasal inhaler   Nasal   Place 2 sprays into the nose daily as needed (allergies or congestion).          [name] zolpidem (  AMBIEN) 5 MG tablet   Oral   Take 1 tablet (5 mg total) by mouth at bedtime as needed for sleep.   20 tablet   0   . fentaNYL (DURAGESIC - DOSED MCG/HR) 100 MCG/HR      Apply 2 patches every 72 hours (total of 200 mcg)   20 patch   0    BP 102/74  Pulse 115  Temp(Src) 98.1 F (36.7 C) (Oral)  Resp 16  SpO2 99% Physical Exam  Nursing note and vitals reviewed. Constitutional:  She is oriented to person, place, and time. She appears well-developed and well-nourished. No distress.  HENT:  Head: Normocephalic and atraumatic.  Mouth/Throat: Oropharynx is clear and moist.  Eyes: Pupils are equal, round, and reactive to light.  Neck: Normal range of motion. Neck supple.  Cardiovascular: Normal rate, regular rhythm, normal heart sounds and intact distal pulses.  Exam reveals no gallop and no friction rub.   No murmur heard. Pulmonary/Chest: Effort normal and breath sounds normal. No respiratory distress.  Neurological: She is alert and oriented to person, place, and time. She exhibits normal muscle tone. Coordination normal.  Skin: Skin is warm and dry. No rash noted. No erythema.    ED Course  Procedures (including critical care time) Labs Review Labs Reviewed  CBC WITH DIFFERENTIAL - Abnormal; Notable for the following:    RBC 3.81 (*)    Hemoglobin 10.4 (*)    HCT 32.3 (*)    Neutrophils Relative % 82 (*)    Neutro Abs 8.2 (*)    Lymphocytes Relative 9 (*)    All other components within normal limits  COMPREHENSIVE METABOLIC PANEL - Abnormal; Notable for the following:    Sodium 126 (*)    Potassium 3.4 (*)    Chloride 90 (*)    Glucose, Bld 108 (*)    Calcium 10.8 (*)    Albumin 2.0 (*)    Alkaline Phosphatase 918 (*)    Total Bilirubin 2.6 (*)    All other components within normal limits  LIPASE, BLOOD - Abnormal; Notable for the following:    Lipase 8 (*)    All other components within normal limits  URINALYSIS, ROUTINE W REFLEX MICROSCOPIC - Abnormal; Notable for the following:    Color, Urine ORANGE (*)    APPearance CLOUDY (*)    Bilirubin Urine MODERATE (*)    Protein, ur 30 (*)    Nitrite POSITIVE (*)    Leukocytes, UA SMALL (*)    All other components within normal limits  URINE CULTURE  TROPONIN I  URINE MICROSCOPIC-ADD ON  COMPREHENSIVE METABOLIC PANEL  CBC  I-STAT CG4 LACTIC ACID, ED   Imaging Review Dg Chest 2 View  06/02/2013    CLINICAL DATA:  weakness and fever  EXAM: CHEST  2 VIEW  COMPARISON:  CT CHEST W/CM dated 05/23/2013; DG CHEST 2 VIEW dated 05/04/2013  FINDINGS: Low lung volumes. Small right pleural effusion. Aerated lungs are clear. Cardiac silhouette enlarged. Right-sided porta catheter with tip projected regions superior vena cava. No gross osseous abnormalities.  IMPRESSION: Small right pleural effusion otherwise no evidence of focal infiltrates.   Electronically Signed   By: Margaree Mackintosh M.D.   On: 06/02/2013 17:07   Dg Abd 2 Views  06/02/2013   CLINICAL DATA:  Vomiting, fever and chills. Right-sided abdominal pain.  EXAM: ABDOMEN - 2 VIEW  COMPARISON:  Abdominal radiograph performed 11/23/2012, and CT of the abdomen and pelvis performed 05/23/2013  FINDINGS:  The patient's two percutaneous biliary drainage catheters are again noted. The visualized bowel gas pattern is unremarkable. Scattered air and stool filled loops of colon are seen; no abnormal dilatation of small bowel loops is seen to suggest small bowel obstruction. No free intra-abdominal air is identified, though evaluation for free air is limited on a single supine view. Scattered clips are noted in the right upper quadrant. A chest port is partially imaged.  Changes of prior vertebroplasty are noted at multiple levels along the lumbar spine; the sacroiliac joints are unremarkable in appearance. The visualized lung bases are essentially clear.  IMPRESSION: Unremarkable bowel gas pattern; no free intra-abdominal air seen. Percutaneous biliary drainage catheters are unchanged in position.   Electronically Signed   By: Garald Balding M.D.   On: 06/02/2013 21:16    Patient be admitted to the hospital for further evaluation and care.  Patient given a plan and all questions ranch.  I spoke with the, [facility], who will be down to admit the patient    Brent General, PA-C 06/04/13 0126

## 2013-06-02 NOTE — ED Notes (Signed)
MD at bedside. 

## 2013-06-02 NOTE — ED Notes (Signed)
Family at bedside. 

## 2013-06-02 NOTE — ED Notes (Signed)
Pt report weakness, onset 3 dayus ago, low grade fever, chills, one episode of emesis. Also reports dizziness, mild shortness of breath, Of note pt has an implanted port that she was informed might be infected and scheduled to be removed on Monday. Pt is in NAD.

## 2013-06-03 LAB — COMPREHENSIVE METABOLIC PANEL
ALBUMIN: 1.6 g/dL — AB (ref 3.5–5.2)
ALT: 16 U/L (ref 0–35)
AST: 28 U/L (ref 0–37)
Alkaline Phosphatase: 874 U/L — ABNORMAL HIGH (ref 39–117)
BILIRUBIN TOTAL: 2.4 mg/dL — AB (ref 0.3–1.2)
BUN: 8 mg/dL (ref 6–23)
CHLORIDE: 95 meq/L — AB (ref 96–112)
CO2: 23 mEq/L (ref 19–32)
CREATININE: 0.59 mg/dL (ref 0.50–1.10)
Calcium: 9.5 mg/dL (ref 8.4–10.5)
GLUCOSE: 86 mg/dL (ref 70–99)
Potassium: 4 mEq/L (ref 3.7–5.3)
Sodium: 128 mEq/L — ABNORMAL LOW (ref 137–147)
Total Protein: 6 g/dL (ref 6.0–8.3)

## 2013-06-03 LAB — HEPARIN LEVEL (UNFRACTIONATED)
HEPARIN UNFRACTIONATED: 0.66 [IU]/mL (ref 0.30–0.70)
HEPARIN UNFRACTIONATED: 0.39 [IU]/mL (ref 0.30–0.70)
Heparin Unfractionated: 0.27 IU/mL — ABNORMAL LOW (ref 0.30–0.70)
Heparin Unfractionated: 0.29 IU/mL — ABNORMAL LOW (ref 0.30–0.70)

## 2013-06-03 LAB — CBC
HEMATOCRIT: 26.7 % — AB (ref 36.0–46.0)
Hemoglobin: 8.9 g/dL — ABNORMAL LOW (ref 12.0–15.0)
MCH: 28.1 pg (ref 26.0–34.0)
MCHC: 33.3 g/dL (ref 30.0–36.0)
MCV: 84.2 fL (ref 78.0–100.0)
PLATELETS: 287 10*3/uL (ref 150–400)
RBC: 3.17 MIL/uL — ABNORMAL LOW (ref 3.87–5.11)
RDW: 15.4 % (ref 11.5–15.5)
WBC: 7.4 10*3/uL (ref 4.0–10.5)

## 2013-06-03 LAB — URINE CULTURE
Colony Count: NO GROWTH
Culture: NO GROWTH

## 2013-06-03 LAB — CULTURE, BLOOD (SINGLE)
Organism ID, Bacteria: NO GROWTH
Organism ID, Bacteria: NO GROWTH

## 2013-06-03 LAB — APTT: aPTT: 67 seconds — ABNORMAL HIGH (ref 24–37)

## 2013-06-03 MED ORDER — HEPARIN (PORCINE) IN NACL 100-0.45 UNIT/ML-% IJ SOLN
1300.0000 [IU]/h | INTRAMUSCULAR | Status: DC
  Filled 2013-06-03: qty 250

## 2013-06-03 MED ORDER — HEPARIN (PORCINE) IN NACL 100-0.45 UNIT/ML-% IJ SOLN
1300.0000 [IU]/h | INTRAMUSCULAR | Status: AC
  Administered 2013-06-03: 1300 [IU]/h via INTRAVENOUS
  Filled 2013-06-03: qty 250

## 2013-06-03 MED ORDER — DILTIAZEM HCL ER COATED BEADS 180 MG PO CP24
180.0000 mg | ORAL_CAPSULE | Freq: Every day | ORAL | Status: DC
  Administered 2013-06-03: 180 mg via ORAL
  Filled 2013-06-03 (×2): qty 1

## 2013-06-03 MED ORDER — PIPERACILLIN-TAZOBACTAM 3.375 G IVPB
3.3750 g | Freq: Three times a day (TID) | INTRAVENOUS | Status: DC
  Administered 2013-06-03 – 2013-06-04 (×3): 3.375 g via INTRAVENOUS
  Filled 2013-06-03 (×5): qty 50

## 2013-06-03 NOTE — Evaluation (Signed)
Physical Therapy Evaluation Patient Details Name: [name] MRN: [id number] DOB: [birth year] Today's Date: 06/03/2013   History of Present Illness  63 yo female admitted with N/V, syncope. Hx of met cervical cancer, A fib, HTN, lymphedema.   Clinical Impression  On eval, pt required Min assist for mobility-able to ambulate ~15 feet x 2 with walker. Deferred further ambulation due to RN reporting pt's HR upwards of 190 bpm with activity.    Follow Up Recommendations Home health PT;Supervision/Assistance - 24 hour    Equipment Recommendations  None recommended by PT    Recommendations for Other Services OT consult     Precautions / Restrictions Precautions Precautions: Fall Precaution Comments: monitor HR; lightheadedness/dizziness; R side drain Restrictions Weight Bearing Restrictions: No      Mobility  Bed Mobility Overal bed mobility: Needs Assistance Bed Mobility: Supine to Sit;Rolling;Sidelying to Sit Rolling: Min guard Sidelying to sit: HOB elevated;Min assist       General bed mobility comments: small amount of trunk to upright. Increased time. Moderate use of handrail  Transfers Overall transfer level: Needs assistance Equipment used: Rolling walker (2 wheeled) Transfers: Sit to/from Stand Sit to Stand: Min assist         General transfer comment: Assist to rise, stabilize, control descent. VCs safety, technique, hand placement. Pt reports mild lightheadedness however noted to have increased anterior/posterior swaying with static standing  Ambulation/Gait Ambulation/Gait assistance: Min assist Ambulation Distance (Feet): 15 Feet (x2) Assistive device: Rolling walker (2 wheeled) Gait Pattern/deviations: Decreased stride length;Trunk flexed;Step-through pattern     General Gait Details: slow gait speed. unsteady. HR up to 190 bpm while ambulating to bathroom (per RN). Assisted pt from bathroom to recliner. Deferred any further ambulation due to  HR.  Stairs            Wheelchair Mobility    Modified Rankin (Stroke Patients Only)       Balance                                             Pertinent Vitals/Pain Abdomen 7/10    Home Living Family/patient expects to be discharged to:: Private residence Living Arrangements: Spouse/significant other Available Help at Discharge: Family   Home Access: Stairs to enter Entrance Stairs-Rails: Right Entrance Stairs-Number of Steps: Saline: Environmental consultant - 2 wheels;Bedside commode;Wheelchair - manual      Prior Function Level of Independence: Independent               Hand Dominance        Extremity/Trunk Assessment   Upper Extremity Assessment: Generalized weakness           Lower Extremity Assessment: Generalized weakness      Cervical / Trunk Assessment: Normal  Communication   Communication: No difficulties  Cognition Arousal/Alertness: Awake/alert Behavior During Therapy: WFL for tasks assessed/performed Overall Cognitive Status: Within Functional Limits for tasks assessed                      General Comments      Exercises        Assessment/Plan    PT Assessment Patient needs continued PT services  PT Diagnosis Difficulty walking;Generalized weakness   PT Problem List Decreased strength;Decreased activity tolerance;Decreased balance;Decreased mobility;Cardiopulmonary status limiting activity  PT Treatment Interventions DME instruction;Gait training;Functional mobility training;Therapeutic activities;Therapeutic exercise;Patient/family education;Balance  training   PT Goals (Current goals can be found in the Care Plan section) Acute Rehab PT Goals Patient Stated Goal: home PT Goal Formulation: With patient Time For Goal Achievement: 06/17/13 Potential to Achieve Goals: Good    Frequency Min 3X/week   Barriers to discharge        Co-evaluation               End of Session Equipment  Utilized During Treatment: Gait belt Activity Tolerance: Other (comment) (limited by HR) Patient left: in chair;with call bell/phone within reach           Time: 1032-1048 PT Time Calculation (min): 16 min   Charges:   PT Evaluation $Initial PT Evaluation Tier I: 1 Procedure PT Treatments $Gait Training: 8-22 mins   PT G Codes:          Weston Anna, MPT Pager: [phone]

## 2013-06-03 NOTE — Progress Notes (Signed)
ANTICOAGULATION CONSULT NOTE - Follow Up Consult  Pharmacy Consult for Heparin Indication: atrial fibrillation and DVT bridge while off xarelto   Allergies  Allergen Reactions  . Codeine Other (See Comments)    Breathing issues  . Flecainide Acetate Other (See Comments)    Leg cramping   . Morphine Other (See Comments)     Hallucinations  . Propafenone Hcl Other (See Comments)    Leg cramps    Patient Measurements: Height: 5\' 1"  (154.9 cm) Weight: 161 lb 13.1 oz (73.4 kg) IBW/kg (Calculated) : 47.8 Heparin Dosing Weight:   Vital Signs: Temp: 98.3 F (36.8 C) (04/05 2150) Temp src: Oral (04/05 2150) BP: 107/65 mmHg (04/05 2150) Pulse Rate: 92 (04/05 2150)  Labs:  Recent Labs  06/02/13 1645  06/03/13 0354 06/03/13 0809 06/03/13 1406 06/03/13 2130  HGB 10.4*  --  8.9*  --   --   --   HCT 32.3*  --  26.7*  --   --   --   PLT 369  --  287  --   --   --   APTT  --   --   --  67*  --   --   HEPARINUNFRC  --   < > 0.66 0.39 0.27* 0.29*  CREATININE 0.65  --  0.59  --   --   --   TROPONINI <0.30  --   --   --   --   --   < > = values in this interval not displayed.  Estimated Creatinine Clearance: 67.6 ml/min (by C-G formula based on Cr of 0.59).   Medications:  Infusions:  . 0.9 % NaCl with KCl 40 mEq / L 100 mL/hr at 06/03/13 1241  . heparin      Assessment: Patient with low heparin level.  D/C heparin at 0600 per Rad direction.  Goal of Therapy:  Heparin level 0.3-0.7 units/ml Monitor platelets by anticoagulation protocol: Yes   Plan:  Increase heparin to 1300 units/hr No need to check level with d/c at 0600 Follow up in am with anticoagulation plan.  Tyler Deis, Shea Stakes Crowford 06/03/2013,10:28 PM

## 2013-06-03 NOTE — Progress Notes (Signed)
ANTICOAGULATION CONSULT NOTE - Follow Up  Pharmacy Consult for Heparin Indication: atrial fibrillation and DVT bridge while off xarelto  Allergies  Allergen Reactions  . Codeine Other (See Comments)    Breathing issues  . Flecainide Acetate Other (See Comments)    Leg cramping   . Morphine Other (See Comments)     Hallucinations  . Propafenone Hcl Other (See Comments)    Leg cramps    Patient Measurements: Height: 5\' 1"  (154.9 cm) Weight: 161 lb 13.1 oz (73.4 kg) IBW/kg (Calculated) : 47.8 Heparin Dosing Weight:   Vital Signs: Temp: 98.3 F (36.8 C) (04/05 0610) Temp src: Oral (04/05 0610) BP: 112/81 mmHg (04/05 0616) Pulse Rate: 112 (04/05 0616)  Labs:  Recent Labs  06/02/13 1645 06/03/13 0354 06/03/13 0809  HGB 10.4* 8.9*  --   HCT 32.3* 26.7*  --   PLT 369 287  --   APTT  --   --  67*  HEPARINUNFRC  --  0.66 0.39  CREATININE 0.65 0.59  --   TROPONINI <0.30  --   --     Estimated Creatinine Clearance: 67.6 ml/min (by C-G formula based on Cr of 0.59).   Medical History: Past Medical History  Diagnosis Date  . Diastolic dysfunction   . MIGRAINE HEADACHE   . Atrial fibrillation     failed DCCV, CHADs2-prev pradaxa stopped due to hematuria with ureter issues/JJ   . Anemia   . HTN (hypertension)   . GLAUCOMA   . CARPAL TUNNEL SYNDROME, RIGHT   . Lymphedema     L>R leg from pelvic XRT/scarring  . Hepatic steatosis     CT scan 06/2009, 12/2009  . DVT (deep venous thrombosis) 10/2010    LLE, anticoag resumed  . CHF (congestive heart failure)     1 yr ago per pt  . Arthritis   . GERD (gastroesophageal reflux disease)   . Small kidney     left  . Radiation 10/11/11-11/19/11    5040 cGy in 28 fx's/periaortic  . Radiation 03/16/10-03/25/10    right inguinal node/left external iliac node  . Radiation 06/11/08-07/23/08    6000 cGy left pelvis  . Hyperlipidemia   . Leg swelling     left  . Cervical cancer dx 04/2003, recur 04/2008    s/p debulk (TAHBSO),  chemo/XRT; recurrent dz L pelvis dx 2010 and liver met 09/2010 s/p RFA  . Osteoporosis with fracture 2014    compression fx, s/p KP ; started prolia 06/2012- narcotic pain mgmt  . Ascending cholangitis 05/04/2013  . Gram-negative bacteremia 05/06/2013  . 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 0.9 % NaCl with KCl 40 mEq / L 100 mL/hr (06/02/13 2210)  . heparin 1,050 Units/hr (06/03/13 0002)    Assessment: 63 yo with hx metastatic cervical cancer, bili obtr s/p stenting and drain Feb 2015, chronic afib, hx DVT. Recent hospitalization for sepsis, bacteremia and cholangitis completed course of Cipro and Diflucan. Patient with recent fevers along with NV, weakness and syncope episode 4/3. Per notes, Dr. Humphrey Rolls is considering new round of chemo soon. Per ID recs, to have PAC taken out 4/6 by IR and place PICC. Xarelto has been held and IV heparin per pharmacy started per-operatively.  Heparin level therapeutic  Ptt slightly elevated from baseline  No reported bleeding  Plts stable, drop in Hgb - monitor  Goal of Therapy:  Heparin level 0.3-0.7 units/ml Monitor platelets by anticoagulation protocol: Yes   Plan:  1) Continue IV heparin 1050 units/hr 2) STOP IV heparin at midnight tonight for procedure tomorrow as already ordered 3) Recheck heparin level at 2pm today for confirmatory level   Adrian Saran, PharmD, BCPS Pager [phone] 06/03/2013 9:19 AM

## 2013-06-03 NOTE — Progress Notes (Signed)
ANTICOAGULATION CONSULT NOTE - Follow Up  Pharmacy Consult for Heparin Indication: atrial fibrillation and DVT bridge while off xarelto  Allergies  Allergen Reactions  . Codeine Other (See Comments)    Breathing issues  . Flecainide Acetate Other (See Comments)    Leg cramping   . Morphine Other (See Comments)     Hallucinations  . Propafenone Hcl Other (See Comments)    Leg cramps    Patient Measurements: Height: 5\' 1"  (154.9 cm) Weight: 161 lb 13.1 oz (73.4 kg) IBW/kg (Calculated) : 47.8 Heparin Dosing Weight:   Vital Signs: Temp: 97.3 F (36.3 C) (04/05 1450) Temp src: Oral (04/05 1450) BP: 115/62 mmHg (04/05 1450) Pulse Rate: 87 (04/05 1450)  Labs:  Recent Labs  06/02/13 1645 06/03/13 0354 06/03/13 0809 06/03/13 1406  HGB 10.4* 8.9*  --   --   HCT 32.3* 26.7*  --   --   PLT 369 287  --   --   APTT  --   --  67*  --   HEPARINUNFRC  --  0.66 0.39 0.27*  CREATININE 0.65 0.59  --   --   TROPONINI <0.30  --   --   --     Estimated Creatinine Clearance: 67.6 ml/min (by C-G formula based on Cr of 0.59).  Assessment: 63 yo with hx metastatic cervical cancer, bili obtr s/p stenting and drain Feb 2015, chronic afib, hx DVT. Recent hospitalization for sepsis, bacteremia and cholangitis completed course of Cipro and Diflucan. Patient with recent fevers along with NV, weakness and syncope episode 4/3. Per notes, Dr. Humphrey Rolls is considering new round of chemo soon. Per ID recs, to have PAC taken out 4/6 by IR and place PICC. Xarelto has been held and IV heparin per pharmacy started per-operatively.  Heparin level now slightly below therapeutic range on 1050 units/hr.  No reported bleeding  Plts stable, drop in Hgb - monitor  Goal of Therapy:  Heparin level 0.3-0.7 units/ml Monitor platelets by anticoagulation protocol: Yes   Plan:  Increase IV heparin to 1200 units/hr. Recheck heparin level in 6 hours ~2130. STOP IV heparin at midnight tonight for procedure tomorrow  as already ordered.  Romeo Rabon, PharmD, pager [phone]. 06/03/2013,3:31 PM.

## 2013-06-03 NOTE — Progress Notes (Signed)
ANTIBIOTIC CONSULT NOTE - INITIAL  Pharmacy Consult for Vancomycin Indication: UTI, IV cath infection  Allergies  Allergen Reactions  . Codeine Other (See Comments)    Breathing issues  . Flecainide Acetate Other (See Comments)    Leg cramping   . Morphine Other (See Comments)     Hallucinations  . Propafenone Hcl Other (See Comments)    Leg cramps    Patient Measurements: Height: 5\' 1"  (154.9 cm) Weight: 162 lb (73.483 kg) IBW/kg (Calculated) : 47.8 Adjusted Body Weight:   Vital Signs: Temp: 98.1 F (36.7 C) (04/04 2152) Temp src: Oral (04/04 2152) BP: 116/70 mmHg (04/04 2152) Pulse Rate: 91 (04/04 2152) Intake/Output from previous day: 04/04 0701 - 04/05 0700 In: 23 [P.O.:240; IV Piggyback:250] Out: 150 [Urine:100; Drains:50] Intake/Output from this shift: Total I/O In: 490 [P.O.:240; IV Piggyback:250] Out: 150 [Urine:100; Drains:50]  Labs:  Recent Labs  06/02/13 1645  WBC 10.0  HGB 10.4*  PLT 369  CREATININE 0.65   Estimated Creatinine Clearance: 67.7 ml/min (by C-G formula based on Cr of 0.65). No results found for this basename: VANCOTROUGH, Corlis Leak, VANCORANDOM, GENTTROUGH, GENTPEAK, GENTRANDOM, TOBRATROUGH, TOBRAPEAK, TOBRARND, AMIKACINPEAK, AMIKACINTROU, AMIKACIN,  in the last 72 hours   Microbiology: Recent Results (from the past 720 hour(s))  CULTURE, BLOOD (ROUTINE X 2)     Status: None   Collection Time    05/04/13  8:41 PM      Result Value Ref Range Status   Specimen Description BLOOD LEFT ANTECUBITAL   Final   Special Requests BOTTLES DRAWN AEROBIC AND ANAEROBIC 8ML   Final   Culture  Setup Time     Final   Value: 05/05/2013 01:09     Performed at [facility]   Culture     Final   Value: SERRATIA MARCESCENS     Note: SUSCEPTIBILITIES PERFORMED ON PREVIOUS CULTURE WITHIN THE LAST 5 DAYS.     Note: Gram Stain Report Called to,Read Back By and Verified With: Paighton HUFF 05/05/13 @ 6:28PM BY RUSCOE A.     Performed at [facility]   Report Status 05/07/2013 FINAL   Final  CULTURE, BLOOD (ROUTINE X 2)     Status: None   Collection Time    05/04/13  9:40 PM      Result Value Ref Range Status   Specimen Description BLOOD RIGHT PORTA CATH   Final   Special Requests BOTTLES DRAWN AEROBIC AND ANAEROBIC 5CC   Final   Culture  Setup Time     Final   Value: 05/05/2013 01:09     Performed at [facility]   Culture     Final   Value: SERRATIA MARCESCENS     Note: Gram Stain Report Called to,Read Back By and Verified With: [name] 05/05/13 @ 6:28PM BY RUSCOE A.     Performed at [facility]   Report Status 05/07/2013 FINAL   Final   Organism ID, Bacteria SERRATIA MARCESCENS   Final  CULTURE, BLOOD (ROUTINE X 2)     Status: None   Collection Time    05/07/13  4:25 PM      Result Value Ref Range Status   Specimen Description BLOOD RIGHT ARM   Final   Special Requests BOTTLES DRAWN AEROBIC AND ANAEROBIC 10CC EACH   Final   Culture  Setup Time     Final   Value: 05/07/2013 20:56     Performed at [facility]  Final   Value: NO GROWTH 5 DAYS     Performed at [facility]   Report Status 05/13/2013 FINAL   Final  CULTURE, BLOOD (ROUTINE X 2)     Status: None   Collection Time    05/07/13  4:36 PM      Result Value Ref Range Status   Specimen Description BLOOD LEFT ARM   Final   Special Requests BOTTLES DRAWN AEROBIC AND ANAEROBIC [facility] EACH   Final   Culture  Setup Time     Final   Value: 05/08/2013 13:28     Performed at [facility]   Culture     Final   Value: NO GROWTH 5 DAYS     Performed at [facility]   Report Status 05/14/2013 FINAL   Final  CLOSTRIDIUM DIFFICILE BY PCR     Status: None   Collection Time    05/08/13  5:12 AM      Result Value Ref Range Status   C difficile by pcr NEGATIVE  NEGATIVE Final   Comment: Performed at [facility]     Status: None   Collection Time    05/08/13  2:20 PM      Result  Value Ref Range Status   Specimen Description LIVER BILE FROM BILIARY DRAINS LEFT   Final   Special Requests Normal   Final   Gram Stain     Final   Value: NO WBC SEEN     NO ORGANISMS SEEN     Performed at [facility]   Culture     Final   Value: FEW ENTEROBACTER CLOACAE     Note: CRITICAL RESULT CALLED TO, READ BACK BY AND VERIFIED WITH: BESS B$RemoveBef'@10'XpeTQcLMJD$ :13AM ON 05/09/13 BY DANTS     FEW CANDIDA LUSITANIAE     Performed at [facility]   Report Status 05/13/2013 FINAL   Final   Organism ID, Bacteria ENTEROBACTER CLOACAE   Final  BODY FLUID CULTURE     Status: None   Collection Time    05/08/13  2:40 PM      Result Value Ref Range Status   Specimen Description LIVER Bile from biliary drain RIGHT   Final   Special Requests NONE   Final   Gram Stain     Final   Value: NO WBC SEEN     NO ORGANISMS SEEN     Performed at [facility]   Culture     Final   Value: FEW ENTEROBACTER CLOACAE     Note: SUSCEPTIBILITIES PERFORMED ON PREVIOUS CULTURE WITHIN THE LAST 5 DAYS. CRITICAL RESULT CALLED TO, READ BACK BY AND VERIFIED WITH: BESS B$RemoveBef'@10'[id number]$ :13AM ON 05/09/13 BY DANTS     FEW CANDIDA LUSITANIAE     Performed at [facility]   Report Status 05/13/2013 FINAL   Final  CULTURE, BLOOD (SINGLE)     Status: None   Collection Time    05/28/13 11:30 AM      Result Value Ref Range Status   Preliminary Report Blood Culture received; No Growth to date;   Preliminary   Preliminary Report Culture will be held for 5 days before issuing   Preliminary   Preliminary Report a Final Negative report.   Preliminary  CULTURE, BLOOD (SINGLE)     Status: None   Collection Time    05/28/13 12:00 PM      Result Value Ref Range Status  Preliminary Report Blood Culture received; No Growth to date;   Preliminary   Preliminary Report Culture will be held for 5 days before issuing   Preliminary   Preliminary Report a Final Negative report.   Preliminary    Medical History: Past Medical  History  Diagnosis Date  . Diastolic dysfunction   . MIGRAINE HEADACHE   . Atrial fibrillation     failed DCCV, CHADs2-prev pradaxa stopped due to hematuria with ureter issues/JJ   . Anemia   . HTN (hypertension)   . GLAUCOMA   . CARPAL TUNNEL SYNDROME, RIGHT   . Lymphedema     L>R leg from pelvic XRT/scarring  . Hepatic steatosis     CT scan 06/2009, 12/2009  . DVT (deep venous thrombosis) 10/2010    LLE, anticoag resumed  . CHF (congestive heart failure)     1 yr ago per pt  . Arthritis   . GERD (gastroesophageal reflux disease)   . Small kidney     left  . Radiation 10/11/11-11/19/11    5040 cGy in 28 fx's/periaortic  . Radiation 03/16/10-03/25/10    right inguinal node/left external iliac node  . Radiation 06/11/08-07/23/08    6000 cGy left pelvis  . Hyperlipidemia   . Leg swelling     left  . Cervical cancer dx 04/2003, recur 04/2008    s/p debulk (TAHBSO), chemo/XRT; recurrent dz L pelvis dx 2010 and liver met 09/2010 s/p RFA  . Osteoporosis with fracture 2014    compression fx, s/p KP ; started prolia 06/2012- narcotic pain mgmt  . Ascending cholangitis 05/04/2013  . Gram-negative bacteremia 05/06/2013  . 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    Goal of Therapy:  Vancomycin trough level 15-20 mcg/ml  Plan:  Measure antibiotic drug levels at steady state Follow up culture results Vancomycin 1gm iv q12hr  Nani Skillern Crowford 06/03/2013,2:44 AM

## 2013-06-03 NOTE — Progress Notes (Addendum)
Patient ID: [name], female   DOB: 09-01-1950, 63 y.o.   MRN: [id number]   Request has been rec'd for L/R perc bili drain evaluation/exchange and PAC removal/exchange in IR  PA will see pt in am for evaluation; note; orders....  Orders in computer for NPO; DC Hep drip at 6 am 4/6 Pt on Zosyn 3x/day

## 2013-06-03 NOTE — Progress Notes (Signed)
Note: This document was prepared with digital dictation and possible smart phrase technology. Any transcriptional errors that result from this process are unintentional.   [name] DPO:[id number] DOB: [birth year] DOA: 06/02/2013 PCP: Gwendolyn Grant, MD  Brief narrative:  63 y/o ? 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 Htn recent Hospital stay 05/04/13-->05/15/13 Asc.Cholangitis with Serratia and C [place] bacteremia completing Cipro/Fluconazole 3/24 was  re-admitted to [facility] c Potential UTI, presyncopal episode along with nausea and vomiting prompting thought of either Pyelonephritis/obstructive jaundice/advanced cervical cancer with metastases as CT scan of abdomen 05/23/13 = interval development of omental and notes concerning for new metastatic involvement with slight progression of hepatoduodenal ligament and retroperitoneum lymphadenopathy On admission had low blood pressures 90s over 60s ,Sodium 128, BUN/creatinine 8/0.5, alkaline phosphatase 874, albumin 1.6, hemoglobin 9, HCT 26, alkaline phosphatase 874 , total bilirubin 2.4, lactic acid 1.8, point-of-care troponin 0.3 Given consideration for urinary tract infection empirically Rocephin and vancomycin- Patient states that she has not felt strong and does not think that she recovered from the ascending cholangitis from last time overall just feels weak and is drinking more than she is eating and eating very little overall. Per creams have been draining fairly well left greater than right without decreased output.  She has chronic left lower extremity lymphedema   Past medical history-As per Problem list Chart reviewed as below-  Consultants:   interventional radiology  Oncology   Procedures: acute abdominal series    acute abdominal series  06/02/13  Chest x-ray 06/02/48   Antibiotics:   Rocephin 06/02/13 -06/03/13   Zosyn 06/03/13   Vancomycin 06/02/13-06/03/13    Subjective   doing fair.   does not have dysuria  Tolerating some fluids however has not really been eating much at all No current nausea -wants to eat No chest pain No diarrhea No dark stool no dark urine  No fever or chills although had low grade fevers at home for the past couple of days  Percutaneous drains are draining well     Objective    Interim History:  reviewed  Telemetry:  atrial fibrillation rates in the 100 to 115 range    Objective: Filed Vitals:   06/02/13 2152 06/03/13 0610 06/03/13 0615 06/03/13 0616  BP: 116/70 111/73 122/78 112/81  Pulse: 91 107 110 112  Temp: 98.1 F (36.7 C) 98.3 F (36.8 C)    TempSrc: Oral Oral    Resp: 16 16    Height:      Weight:    73.4 kg (161 lb 13.1 oz)  SpO2: 99% 98%      Intake/Output Summary (Last 24 hours) at 06/03/13 0944 Last data filed at 06/03/13 0559  Gross per 24 hour  Intake 1391.67 ml  Output    150 ml  Net 1241.67 ml    Exam:  General:  alert, tired-appearing pleasant Caucasian female  Cardiovascular:  Tachycardic, S1-S2 atrial fibrillation noted Respiratory: Clinically clear no added sounds Abdomen: Nondistended however multiple drains in place, right upper quadrant discomfort on palpation Skin no lower extremity edema but some lymphedema of the left lower extremity which is chronic Neuro intact  Data Reviewed: Basic Metabolic Panel:  Recent Labs Lab 06/02/13 1645 06/03/13 0354  NA 126* 128*  K 3.4* 4.0  CL  90* 95*  CO2 23 23  GLUCOSE 108* 86  BUN 11 8  CREATININE 0.65 0.59  CALCIUM 10.8* 9.5   Liver Function Tests:  Recent Labs Lab 06/02/13 1645 06/03/13 0354  AST 28 28  ALT 16 16  ALKPHOS 918* 874*  BILITOT 2.6* 2.4*  PROT 7.5 6.0  ALBUMIN 2.0* 1.6*    Recent Labs Lab 06/02/13 1645  LIPASE 8*   No results found for this basename: AMMONIA,  in  the last 168 hours CBC:  Recent Labs Lab 06/02/13 1645 06/03/13 0354  WBC 10.0 7.4  NEUTROABS 8.2*  --   HGB 10.4* 8.9*  HCT 32.3* 26.7*  MCV 84.8 84.2  PLT 369 287   Cardiac Enzymes:  Recent Labs Lab 06/02/13 1645  TROPONINI <0.30   BNP: No components found with this basename: POCBNP,  CBG: No results found for this basename: GLUCAP,  in the last 168 hours  Recent Results (from the past 240 hour(s))  CULTURE, BLOOD (SINGLE)     Status: None   Collection Time    05/28/13 11:30 AM      Result Value Ref Range Status   Preliminary Report Blood Culture received; No Growth to date;   Preliminary   Preliminary Report Culture will be held for 5 days before issuing   Preliminary   Preliminary Report a Final Negative report.   Preliminary  CULTURE, BLOOD (SINGLE)     Status: None   Collection Time    05/28/13 12:00 PM      Result Value Ref Range Status   Preliminary Report Blood Culture received; No Growth to date;   Preliminary   Preliminary Report Culture will be held for 5 days before issuing   Preliminary   Preliminary Report a Final Negative report.   Preliminary     Studies:              All Imaging reviewed and is as per above notation   Scheduled Meds: . diltiazem  180 mg Oral Daily  . docusate sodium  100 mg Oral BID  . fentaNYL  200 mcg Transdermal Q72H  . loratadine  10 mg Oral Daily  . metoprolol  12.5 mg Oral BID  . pantoprazole (PROTONIX) IV  40 mg Intravenous Q24H  . piperacillin-tazobactam (ZOSYN)  IV  3.375 g Intravenous 3 times per day   Continuous Infusions: . 0.9 % NaCl with KCl 40 mEq / L 100 mL/hr (06/02/13 2210)  . heparin 1,050 Units/hr (06/03/13 0002)     Assessment/Plan: 1. Nausea vomiting-likely secondary to #2. 2. Potential cholangitis vs. pyelonephritis-elevated alkaline phosphatase leading me to think maybe she has an obstructive component, no dysuria, chronic right abdominal pain leads me to think small cholangitis and  pyelonephritis . change IV antibiotics to Zosyn. Discontinued vancomycin/Rocephin.  Await the cultures x2 ordered 4/5, await urine culture-patient will need probable drain exchange this admission. The more pressing concern is that she has a port that was suspected to be potential nidus for infection which may need to be removed and was scheduled to be removed on 06/04/48 and I will alert her to this. If needed we will get infectious disease input-at this stage we will not cover for fungal bacteremia but await cultures 3. Presyncopal episode-up late as result of poor by mouth intake with excess fluid >diet. Will order her diet and see how she feels. Her beta blockers has been cut back slightly. She is also on fentanyl 200 mcg daily which may contribute  to mild hypertension-order orthostatic vital signs and monitor 4. Euvolemic hyponatremia with possible tea/toast potomania-increase solid diet, hydrate 100 cc per hour.  Baseline sodium is 134. 5. Chronic atrial fibrillation now on heparin GTT-continue Cardizem 180 mg daily, metoprolol given a lower dose 12.5 twice a day-hold xarelto for now 6. Metastatic cervical cancer-supposeed to start new Chemo regimen-Will cc Dr. Humphrey Rolls on note from today-CT scan from 3/25 showed spread to the omentum and slight progression of hepatoduodenal ligament lymphadenopathy- 7. Hypokalemia potassium has been replaced  8. Adult failure to thrive-as above 9. 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

## 2013-06-03 NOTE — Progress Notes (Signed)
ANTICOAGULATION CONSULT NOTE - Initial Consult  Pharmacy Consult for Heparin Indication: atrial fibrillation and DVT bridge while off xarelto  Allergies  Allergen Reactions  . Codeine Other (See Comments)    Breathing issues  . Flecainide Acetate Other (See Comments)    Leg cramping   . Morphine Other (See Comments)     Hallucinations  . Propafenone Hcl Other (See Comments)    Leg cramps    Patient Measurements: Height: 5\' 1"  (154.9 cm) Weight: 162 lb (73.483 kg) IBW/kg (Calculated) : 47.8 Heparin Dosing Weight:   Vital Signs: Temp: 98.1 F (36.7 C) (04/04 2152) Temp src: Oral (04/04 2152) BP: 116/70 mmHg (04/04 2152) Pulse Rate: 91 (04/04 2152)  Labs:  Recent Labs  06/02/13 1645  HGB 10.4*  HCT 32.3*  PLT 369  CREATININE 0.65  TROPONINI <0.30    Estimated Creatinine Clearance: 67.7 ml/min (by C-G formula based on Cr of 0.65).   Medical History: Past Medical History  Diagnosis Date  . Diastolic dysfunction   . MIGRAINE HEADACHE   . Atrial fibrillation     failed DCCV, CHADs2-prev pradaxa stopped due to hematuria with ureter issues/JJ   . Anemia   . HTN (hypertension)   . GLAUCOMA   . CARPAL TUNNEL SYNDROME, RIGHT   . Lymphedema     L>R leg from pelvic XRT/scarring  . Hepatic steatosis     CT scan 06/2009, 12/2009  . DVT (deep venous thrombosis) 10/2010    LLE, anticoag resumed  . CHF (congestive heart failure)     1 yr ago per pt  . Arthritis   . GERD (gastroesophageal reflux disease)   . Small kidney     left  . Radiation 10/11/11-11/19/11    5040 cGy in 28 fx's/periaortic  . Radiation 03/16/10-03/25/10    right inguinal node/left external iliac node  . Radiation 06/11/08-07/23/08    6000 cGy left pelvis  . Hyperlipidemia   . Leg swelling     left  . Cervical cancer dx 04/2003, recur 04/2008    s/p debulk (TAHBSO), chemo/XRT; recurrent dz L pelvis dx 2010 and liver met 09/2010 s/p RFA  . Osteoporosis with fracture 2014    compression fx, s/p KP ;  started prolia 06/2012- narcotic pain mgmt  . Ascending cholangitis 05/04/2013  . Gram-negative bacteremia 05/06/2013  . 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 0.9 % NaCl with KCl 40 mEq / L 100 mL/hr (06/02/13 2210)  . heparin 1,050 Units/hr (06/03/13 0002)    Assessment: Patient with afib/hx of DVT.  Off xarelto for procedure.  MD wishes for heparin now and then stopped at [facility] Sunday night.    Goal of Therapy:  Heparin level 0.3-0.7 units/ml Monitor platelets by anticoagulation protocol: Yes   Plan:  Heparin 1050 units/hr Daily CBC Heparin level at 0800.  Tyler Deis, Shea Stakes [place] 06/03/2013,2:45 AM

## 2013-06-03 NOTE — Progress Notes (Signed)
ANTIBIOTIC CONSULT NOTE   Pharmacy Consult for Zosyn Indication: Intra-abdominal infection  Allergies  Allergen Reactions  . Codeine Other (See Comments)    Breathing issues  . Flecainide Acetate Other (See Comments)    Leg cramping   . Morphine Other (See Comments)     Hallucinations  . Propafenone Hcl Other (See Comments)    Leg cramps    Patient Measurements: Height: 5\' 1"  (154.9 cm) Weight: 161 lb 13.1 oz (73.4 kg) IBW/kg (Calculated) : 47.8  Vital Signs: Temp: 98.3 F (36.8 C) (04/05 0610) Temp src: Oral (04/05 0610) BP: 112/81 mmHg (04/05 0616) Pulse Rate: 112 (04/05 0616) Intake/Output from previous day: 04/04 0701 - 04/05 0700 In: 1391.7 [P.O.:360; I.V.:781.7; IV Piggyback:250] Out: 150 [Urine:100; Drains:50]  Labs:  Recent Labs  06/02/13 1645 06/03/13 0354  WBC 10.0 7.4  HGB 10.4* 8.9*  PLT 369 287  CREATININE 0.65 0.59   Estimated Creatinine Clearance: 67.6 ml/min (by C-G formula based on Cr of 0.59).  Microbiology  Recent Results (from the past 720 hour(s))  CULTURE, BLOOD (ROUTINE X 2)     Status: None   Collection Time    05/04/13  8:41 PM      Result Value Ref Range Status   Specimen Description BLOOD LEFT ANTECUBITAL   Final   Special Requests BOTTLES DRAWN AEROBIC AND ANAEROBIC 8ML   Final   Culture  Setup Time     Final   Value: 05/05/2013 01:09     Performed at [facility]   Culture     Final   Value: SERRATIA MARCESCENS     Note: SUSCEPTIBILITIES PERFORMED ON PREVIOUS CULTURE WITHIN THE LAST 5 DAYS.     Note: Gram Stain Report Called to,Read Back By and Verified With: Dalonda HUFF 05/05/13 @ 6:28PM BY RUSCOE A.     Performed at [facility]   Report Status 05/07/2013 FINAL   Final  CULTURE, BLOOD (ROUTINE X 2)     Status: None   Collection Time    05/04/13  9:40 PM      Result Value Ref Range Status   Specimen Description BLOOD RIGHT PORTA CATH   Final   Special Requests BOTTLES DRAWN AEROBIC AND ANAEROBIC 5CC   Final    Culture  Setup Time     Final   Value: 05/05/2013 01:09     Performed at [facility]   Culture     Final   Value: SERRATIA MARCESCENS     Note: Gram Stain Report Called to,Read Back By and Verified With: [name] 05/05/13 @ 6:28PM BY RUSCOE A.     Performed at [facility]   Report Status 05/07/2013 FINAL   Final   Organism ID, Bacteria SERRATIA MARCESCENS   Final  CULTURE, BLOOD (ROUTINE X 2)     Status: None   Collection Time    05/07/13  4:25 PM      Result Value Ref Range Status   Specimen Description BLOOD RIGHT ARM   Final   Special Requests BOTTLES DRAWN AEROBIC AND ANAEROBIC 10CC EACH   Final   Culture  Setup Time     Final   Value: 05/07/2013 20:56     Performed at [facility]   Culture     Final   Value: NO GROWTH 5 DAYS     Performed at [facility]   Report Status 05/13/2013 FINAL   Final  CULTURE, BLOOD (ROUTINE X 2)  Status: None   Collection Time    05/07/13  4:36 PM      Result Value Ref Range Status   Specimen Description BLOOD LEFT ARM   Final   Special Requests BOTTLES DRAWN AEROBIC AND ANAEROBIC [facility] EACH   Final   Culture  Setup Time     Final   Value: 05/08/2013 13:28     Performed at [facility]   Culture     Final   Value: NO GROWTH 5 DAYS     Performed at [facility]   Report Status 05/14/2013 FINAL   Final  CLOSTRIDIUM DIFFICILE BY PCR     Status: None   Collection Time    05/08/13  5:12 AM      Result Value Ref Range Status   C difficile by pcr NEGATIVE  NEGATIVE Final   Comment: Performed at [facility]     Status: None   Collection Time    05/08/13  2:20 PM      Result Value Ref Range Status   Specimen Description LIVER BILE FROM BILIARY DRAINS LEFT   Final   Special Requests Normal   Final   Gram Stain     Final   Value: NO WBC SEEN     NO ORGANISMS SEEN     Performed at [facility]   Culture     Final   Value: FEW ENTEROBACTER CLOACAE      Note: CRITICAL RESULT CALLED TO, READ BACK BY AND VERIFIED WITH: BESS B@10 :13AM ON 05/09/13 BY DANTS     FEW CANDIDA LUSITANIAE     Performed at [facility]   Report Status 05/13/2013 FINAL   Final   Organism ID, Bacteria ENTEROBACTER CLOACAE   Final  BODY FLUID CULTURE     Status: None   Collection Time    05/08/13  2:40 PM      Result Value Ref Range Status   Specimen Description LIVER Bile from biliary drain RIGHT   Final   Special Requests NONE   Final   Gram Stain     Final   Value: NO WBC SEEN     NO ORGANISMS SEEN     Performed at [facility]   Culture     Final   Value: FEW ENTEROBACTER CLOACAE     Note: SUSCEPTIBILITIES PERFORMED ON PREVIOUS CULTURE WITHIN THE LAST 5 DAYS. CRITICAL RESULT CALLED TO, READ BACK BY AND VERIFIED WITH: BESS B@10 :13AM ON 05/09/13 BY DANTS     FEW CANDIDA LUSITANIAE     Performed at [facility]   Report Status 05/13/2013 FINAL   Final  CULTURE, BLOOD (SINGLE)     Status: None   Collection Time    05/28/13 11:30 AM      Result Value Ref Range Status   Preliminary Report Blood Culture received; No Growth to date;   Preliminary   Preliminary Report Culture will be held for 5 days before issuing   Preliminary   Preliminary Report a Final Negative report.   Preliminary  CULTURE, BLOOD (SINGLE)     Status: None   Collection Time    05/28/13 12:00 PM      Result Value Ref Range Status   Preliminary Report Blood Culture received; No Growth to date;   Preliminary   Preliminary Report Culture will be held for 5 days before issuing   Preliminary   Preliminary Report a Final Negative  report.   Preliminary     Anti-infectives: 4/4 >> vanc >> 4/5 4/4 >> Rocephin >> 4/5 4/5 >> Zosyn >>  Assessment: 46 yoF admitted 4/4 wit possible IV catheter infection and UTI.  PMH includes metastatic cervical cancer, bili obtr s/p stenting and drain Feb 2015, chronic afib, hx DVT, recent hospitalization for sepsis, bacteremia and cholangitis.   She was initially started on IV vancomycin and ceftriaxone.  Pharmacy is now consulted to dose Zosyn for intra-abdominal infection.  Tmax: afebrile  WBCs: 7.4  Renal: SCr 0.59, CrCl 68  Blood and urine cultures have been sent.  Goal of Therapy:  Appropriate abx dosing, eradication of infection.   Plan:   Zosyn 3.375g IV Q8H infused over 4hrs.  Follow up renal fxn and culture results.   Gretta Arab PharmD, BCPS Pager [phone] 06/03/2013 10:30 AM

## 2013-06-04 ENCOUNTER — Ambulatory Visit (HOSPITAL_COMMUNITY): Admission: RE | Admit: 2013-06-04 | Payer: 59 | Source: Ambulatory Visit

## 2013-06-04 ENCOUNTER — Other Ambulatory Visit: Payer: Self-pay | Admitting: Hematology and Oncology

## 2013-06-04 DIAGNOSIS — Z87891 Personal history of nicotine dependence: Secondary | ICD-10-CM

## 2013-06-04 DIAGNOSIS — D649 Anemia, unspecified: Secondary | ICD-10-CM

## 2013-06-04 DIAGNOSIS — E785 Hyperlipidemia, unspecified: Secondary | ICD-10-CM

## 2013-06-04 DIAGNOSIS — Z8541 Personal history of malignant neoplasm of cervix uteri: Secondary | ICD-10-CM

## 2013-06-04 DIAGNOSIS — A419 Sepsis, unspecified organism: Secondary | ICD-10-CM

## 2013-06-04 DIAGNOSIS — I10 Essential (primary) hypertension: Secondary | ICD-10-CM

## 2013-06-04 DIAGNOSIS — E871 Hypo-osmolality and hyponatremia: Secondary | ICD-10-CM

## 2013-06-04 DIAGNOSIS — R509 Fever, unspecified: Secondary | ICD-10-CM

## 2013-06-04 DIAGNOSIS — R112 Nausea with vomiting, unspecified: Secondary | ICD-10-CM

## 2013-06-04 DIAGNOSIS — C649 Malignant neoplasm of unspecified kidney, except renal pelvis: Secondary | ICD-10-CM

## 2013-06-04 DIAGNOSIS — I509 Heart failure, unspecified: Secondary | ICD-10-CM

## 2013-06-04 DIAGNOSIS — K219 Gastro-esophageal reflux disease without esophagitis: Secondary | ICD-10-CM

## 2013-06-04 DIAGNOSIS — Z8505 Personal history of malignant neoplasm of liver: Secondary | ICD-10-CM

## 2013-06-04 LAB — CBC
HCT: 25.6 % — ABNORMAL LOW (ref 36.0–46.0)
HCT: 24.7 % — ABNORMAL LOW (ref 36.0–46.0)
Hemoglobin: 8.4 g/dL — ABNORMAL LOW (ref 12.0–15.0)
Hemoglobin: 8.1 g/dL — ABNORMAL LOW (ref 12.0–15.0)
MCH: 27.8 pg (ref 26.0–34.0)
MCH: 27.9 pg (ref 26.0–34.0)
MCHC: 32.8 g/dL (ref 30.0–36.0)
MCHC: 32.8 g/dL (ref 30.0–36.0)
MCV: 84.8 fL (ref 78.0–100.0)
MCV: 85.2 fL (ref 78.0–100.0)
PLATELETS: 276 10*3/uL (ref 150–400)
Platelets: 249 10*3/uL (ref 150–400)
RBC: 3.02 MIL/uL — ABNORMAL LOW (ref 3.87–5.11)
RBC: 2.9 MIL/uL — AB (ref 3.87–5.11)
RDW: 15.7 % — AB (ref 11.5–15.5)
RDW: 15.5 % (ref 11.5–15.5)
WBC: 6.7 10*3/uL (ref 4.0–10.5)
WBC: 5.2 10*3/uL (ref 4.0–10.5)

## 2013-06-04 LAB — COMPREHENSIVE METABOLIC PANEL
ALK PHOS: 734 U/L — AB (ref 39–117)
ALT: 15 U/L (ref 0–35)
AST: 19 U/L (ref 0–37)
Albumin: 1.5 g/dL — ABNORMAL LOW (ref 3.5–5.2)
BUN: 7 mg/dL (ref 6–23)
CHLORIDE: 97 meq/L (ref 96–112)
CO2: 22 mEq/L (ref 19–32)
CREATININE: 0.68 mg/dL (ref 0.50–1.10)
Calcium: 9.5 mg/dL (ref 8.4–10.5)
GFR calc Af Amer: >90 mL/min (ref >90)
GFR calc non Af Amer: >90 mL/min (ref >90)
Glucose, Bld: 81 mg/dL (ref 70–99)
POTASSIUM: 4.7 meq/L (ref 3.7–5.3)
Sodium: 129 mEq/L — ABNORMAL LOW (ref 137–147)
Total Bilirubin: 2.3 mg/dL — ABNORMAL HIGH (ref 0.3–1.2)
Total Protein: 5.8 g/dL — ABNORMAL LOW (ref 6.0–8.3)

## 2013-06-04 LAB — VITAMIN B12: Vitamin B-12: 1508 pg/mL — ABNORMAL HIGH (ref 211–911)

## 2013-06-04 LAB — PROTIME-INR
INR: 1.8 — ABNORMAL HIGH (ref 0.00–1.49)
PROTHROMBIN TIME: 20.4 s — AB (ref 11.6–15.2)

## 2013-06-04 LAB — IRON AND TIBC: UIBC: 100 ug/dL — AB (ref 125–400)

## 2013-06-04 LAB — FOLATE: FOLATE: 14.5 ng/mL

## 2013-06-04 LAB — RETICULOCYTES
RBC.: 3.5 MIL/uL — ABNORMAL LOW (ref 3.87–5.11)
Retic Count, Absolute: 70 10*3/uL (ref 19.0–186.0)
Retic Ct Pct: 2 % (ref 0.4–3.1)

## 2013-06-04 LAB — PREPARE RBC (CROSSMATCH)

## 2013-06-04 LAB — FERRITIN: Ferritin: 531 ng/mL — ABNORMAL HIGH (ref 10–291)

## 2013-06-04 LAB — APTT: aPTT: 47 seconds — ABNORMAL HIGH (ref 24–37)

## 2013-06-04 MED ORDER — RIVAROXABAN 20 MG PO TABS
20.0000 mg | ORAL_TABLET | Freq: Every day | ORAL | Status: AC
  Administered 2013-06-04: 20 mg via ORAL
  Filled 2013-06-04: qty 1

## 2013-06-04 MED ORDER — ACETAMINOPHEN 325 MG PO TABS
650.0000 mg | ORAL_TABLET | Freq: Once | ORAL | Status: AC
  Administered 2013-06-04: 650 mg via ORAL
  Filled 2013-06-04: qty 2

## 2013-06-04 MED ORDER — METRONIDAZOLE 500 MG PO TABS
500.0000 mg | ORAL_TABLET | Freq: Three times a day (TID) | ORAL | Status: DC
  Filled 2013-06-04 (×3): qty 1

## 2013-06-04 MED ORDER — DIPHENHYDRAMINE HCL 25 MG PO CAPS
25.0000 mg | ORAL_CAPSULE | Freq: Once | ORAL | Status: AC
Start: 2013-06-04 — End: 2013-06-04
  Administered 2013-06-04: 25 mg via ORAL
  Filled 2013-06-04: qty 1

## 2013-06-04 MED ORDER — FOLIC ACID 1 MG PO TABS
1.0000 mg | ORAL_TABLET | Freq: Every morning | ORAL | Status: DC
  Administered 2013-06-05 – 2013-06-11 (×7): 1 mg via ORAL
  Filled 2013-06-04 (×7): qty 1

## 2013-06-04 MED ORDER — FUROSEMIDE 10 MG/ML IJ SOLN
20.0000 mg | Freq: Once | INTRAMUSCULAR | Status: AC
  Administered 2013-06-05: 20 mg via INTRAVENOUS
  Filled 2013-06-04: qty 4

## 2013-06-04 MED ORDER — SODIUM CHLORIDE 0.9 % IV SOLN
INTRAVENOUS | Status: DC
Start: 2013-06-04 — End: 2013-06-05

## 2013-06-04 MED ORDER — SODIUM CHLORIDE 0.9 % IV SOLN
100.0000 mg | Freq: Every day | INTRAVENOUS | Status: DC
  Administered 2013-06-04 – 2013-06-06 (×3): 100 mg via INTRAVENOUS
  Filled 2013-06-04 (×4): qty 100

## 2013-06-04 MED ORDER — CEFAZOLIN SODIUM-DEXTROSE 2-3 GM-% IV SOLR
2.0000 g | INTRAVENOUS | Status: DC
  Filled 2013-06-04: qty 50

## 2013-06-04 MED ORDER — METRONIDAZOLE IN NACL 5-0.79 MG/ML-% IV SOLN
500.0000 mg | Freq: Three times a day (TID) | INTRAVENOUS | Status: DC
  Administered 2013-06-05 – 2013-06-08 (×10): 500 mg via INTRAVENOUS
  Filled 2013-06-04 (×13): qty 100

## 2013-06-04 MED ORDER — SIMETHICONE 80 MG PO CHEW
80.0000 mg | CHEWABLE_TABLET | Freq: Four times a day (QID) | ORAL | Status: DC
  Administered 2013-06-04 – 2013-06-11 (×29): 80 mg via ORAL
  Filled 2013-06-04 (×31): qty 1

## 2013-06-04 MED ORDER — HYDROMORPHONE HCL 4 MG PO TABS
4.0000 mg | ORAL_TABLET | ORAL | Status: DC | PRN
  Administered 2013-06-04 – 2013-06-08 (×13): 4 mg via ORAL
  Filled 2013-06-04 (×14): qty 1

## 2013-06-04 MED ORDER — METRONIDAZOLE IN NACL 5-0.79 MG/ML-% IV SOLN
500.0000 mg | Freq: Once | INTRAVENOUS | Status: AC
  Administered 2013-06-04: 500 mg via INTRAVENOUS
  Filled 2013-06-04: qty 100

## 2013-06-04 MED ORDER — FERROUS FUMARATE 325 (106 FE) MG PO TABS
1.0000 | ORAL_TABLET | Freq: Every day | ORAL | Status: DC
  Administered 2013-06-04 – 2013-06-05 (×2): 106 mg via ORAL
  Filled 2013-06-04 (×2): qty 1

## 2013-06-04 MED ORDER — CEFEPIME HCL 1 G IJ SOLR
1.0000 g | Freq: Three times a day (TID) | INTRAMUSCULAR | Status: DC
  Administered 2013-06-04 – 2013-06-08 (×11): 1 g via INTRAVENOUS
  Filled 2013-06-04 (×14): qty 1

## 2013-06-04 MED ORDER — RIVAROXABAN 20 MG PO TABS
20.0000 mg | ORAL_TABLET | Freq: Every day | ORAL | Status: DC
  Filled 2013-06-04: qty 1

## 2013-06-04 MED ORDER — DILTIAZEM HCL ER COATED BEADS 180 MG PO CP24
180.0000 mg | ORAL_CAPSULE | Freq: Every day | ORAL | Status: DC
  Administered 2013-06-04 – 2013-06-07 (×4): 180 mg via ORAL
  Filled 2013-06-04 (×4): qty 1

## 2013-06-04 MED ORDER — PANTOPRAZOLE SODIUM 40 MG PO TBEC
40.0000 mg | DELAYED_RELEASE_TABLET | Freq: Every day | ORAL | Status: DC
  Administered 2013-06-04 – 2013-06-11 (×8): 40 mg via ORAL
  Filled 2013-06-04 (×8): qty 1

## 2013-06-04 NOTE — Consult Note (Signed)
[place]  Telephone:(336) [place] I have seen the patient, examined her and edited the notes as follows: Consulting MD: Heath Lark, MD  Summary of her oncologic history: [name] is a 63 year old woman with history of metastatic cervical cancer. In review, she underwent radical hysterectomy with bilateral salpingo-oophorectomy and spelled the lymphadenectomy in March of 2005 for a stage IB cervical cancer. Final pathology demonstrated no residual disease, with all lymph nodes negative. She continued to be followed until February of 2010, when the current sphincter right pelvic sidewall was noted, causing obstruction of the right ureter. She had concurrent chemoradiation with Cisplatinum. There was recurrent of disease in late 2011, with right inguinal lymph nodes and pelvic node involvement. She received to additional radiation therapy to those areas, completed in January of 2012. In April of 2012 she had resolution of the right inguinal lymph nodes. In 2012 she was found to have a liver lesion, which require 6 cycles of Taxol with partial response to chemotherapy. She had ablation of the liver lesion using RFA on 76/72/0947, complicated with extensive DVT in the left lower extremity, requiring anticoagulant therapy with Lovenox and IVC filter placement. In November of 2013 she was noted to have metastatic disease in the left inguinal lymph nodes, requiring low dose chemotherapy with carboplatin and Taxol between December 2013 to January of 2014. This was complicated by neutropenia CT scans in February 2014 showed an increase in size of the liver metastasis. Her treatment was subsequently changed to Alimta. In January of 2015 restaging scans revealed progressive disease with subsequent development of obstructing jaundice requiring percutaneous biliary stent placement by IR. She was last seen at the [facility] on 05/21/2013 following a hospitalization and  05/04/2013 through 05/15/2013 due to fever due to sepsis with gram-negative bacteremia and ascending cholangitis. She completed the course of outpatient Cipro and fluconazole. During that hospitalization she had her biliary stents exchanged. She was to receive treatment with Topotecan once her metabolic issues were resolved, specifically her hyperbilirubinemia. She was due for restaging CT of the chest abdomen and pelvis in late April.  HPI:  She presented on 06/02/2013 with 3 day history of progressive, generalized weakness, anorexia, nausea, dizziness and vomiting no bloody emesis. Her fever was up to 101.7 prior to admission. She was noted to be hypotensive. Her CBC showed normal white count, lipase, her hemoglobin was 10.4. She had hyponatremic and however, with a sodium of 126. Her potassium was 3.4. In addition, her calcium was 10.9. Abdomen x-ray was unremarkable. Chest x-ray showed a small right pleural effusion otherwise no acute disease. Urinalyses showed positive nitrite, suspicious for UTI. She was admitted for further evaluation and management on the under the hospitalist service. She is on Zosyn IV for the treatment of possible UTI. Currently she is afebrile. Xarelto was placed on hold and Heparin per pharmacy was initiated. Nutrition is following oral intake.   At present time, her energy level is fair. She has mild shortness of breath on minimal exertion. Denies any fevers or chills. She has sensation of bloating and nausea. Denies any constipation. The patient denies any recent signs or symptoms of bleeding such as spontaneous epistaxis, hematuria or hematochezia. Denies bone pain.  Review of system off all other organ systems were otherwise negative.  Past Medical History  Diagnosis Date  . Diastolic dysfunction   . MIGRAINE HEADACHE   . Atrial fibrillation     failed DCCV, CHADs2-prev pradaxa stopped  due to hematuria with ureter issues/JJ   . Anemia   . HTN (hypertension)   .  GLAUCOMA   . CARPAL TUNNEL SYNDROME, RIGHT   . Lymphedema     L>R leg from pelvic XRT/scarring  . Hepatic steatosis     CT scan 06/2009, 12/2009  . DVT (deep venous thrombosis) 10/2010    LLE, anticoag resumed  . CHF (congestive heart failure)     1 yr ago per pt  . Arthritis   . GERD (gastroesophageal reflux disease)   . Small kidney     left  . Radiation 10/11/11-11/19/11    5040 cGy in 28 fx's/periaortic  . Radiation 03/16/10-03/25/10    right inguinal node/left external iliac node  . Radiation 06/11/08-07/23/08    6000 cGy left pelvis  . Hyperlipidemia   . Leg swelling     left  . Cervical cancer dx 04/2003, recur 04/2008    s/p debulk (TAHBSO), chemo/XRT; recurrent dz L pelvis dx 2010 and liver met 09/2010 s/p RFA  . Osteoporosis with fracture 2014    compression fx, s/p KP ; started prolia 06/2012- narcotic pain mgmt  . Ascending cholangitis 05/04/2013  . Gram-negative bacteremia 05/06/2013  . 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 Ureteral stent placement  2005, 01/2010, 07/2010    L hydro related to cervical ca and XRT  . Oophorectomy  2005  . Cardioversion    . Tonsillectomy    . Cholecystectomy  1984  . Total abdominal hysterectomy  2005  . Carpal tunnel  2009    right  . Ivc filter  04/2011    MEDICATIONS:  Scheduled Meds: .  ceFAZolin (ANCEF) IV  2 g Intravenous On Call  . diltiazem  180 mg Oral Daily  . docusate sodium  100 mg Oral BID  . fentaNYL  200 mcg Transdermal Q72H  . loratadine  10 mg Oral Daily  . metoprolol  12.5 mg Oral BID  . pantoprazole  40 mg Oral Daily  . piperacillin-tazobactam (ZOSYN)  IV  3.375 g Intravenous 3 times per day  . rivaroxaban  20 mg Oral Q lunch  . [START ON 06/05/2013] rivaroxaban  20 mg Oral Q supper   Continuous Infusions: . sodium chloride    . 0.9 % NaCl with KCl 40 mEq / L 100 mL/hr at 06/04/13 0736   PRN Meds:.acetaminophen, acetaminophen, albuterol, alum & mag  hydroxide-simeth, bisacodyl, fluticasone, HYDROmorphone (DILAUDID) injection, lidocaine-prilocaine, LORazepam, ondansetron (ZOFRAN) IV, ondansetron, sodium chloride, zolpidem   ALLERGIES:   Allergies  Allergen Reactions  . Codeine Other (See Comments)    Breathing issues  . Flecainide Acetate Other (See Comments)    Leg cramping   . Morphine Other (See Comments)     Hallucinations  . Propafenone Hcl Other (See Comments)    Leg cramps   Family History  Problem Relation Age of Onset  . Lung cancer    . Hypertension    . Heart disease    . Stroke    . Asthma    . Asthma Son   . Cancer Mother     lung  . Stroke Father    History   Social History  . Marital Status: Single    Spouse Name: N/A    Number of Children: N/A  . Years of Education: N/A   Occupational History  . Not on file.   Social History Main Topics  . Smoking status: Former  Smoker -- 0.50 packs/day for 3 years    Quit date: 03/01/1984  . Smokeless tobacco: Never Used  . Alcohol Use: No  . Drug Use: No  . Sexual Activity: Not on file   Other Topics Concern  . Not on file   Social History Narrative   Lives with significant other in [place], [place].   Loan processor at [employer]   has grown son and 2 g-sons    PHYSICAL EXAMINATION:   Filed Vitals:   06/04/13 0854  BP: 109/75  Pulse: 100  Temp: 97.5 F (36.4 C)  Resp: 18     Filed Weights   06/02/13 2126 06/03/13 0616 06/04/13 0545  Weight: 162 lb (73.483 kg) 161 lb 13.1 oz (73.4 kg) 157 lb 10.1 oz (71.68 kg)    63 year old white in no acute distress, conversant, alert and oriented to time, place and date.  General well-developed and well-nourished  EYES: Pale conjunctiva normal, no appreciable scleral icterus. ENT. Oral cavity without thrush or lesions. Poor dentition.  Neck supple. no thyromegaly Lymphatics: no cervical or supraclavicular adenopathy  Lungs clear bilaterally . No wheezing, rhonchi or rales. Right port non  tender. Cardiac: irregularly irregular rate and rhythm,no murmur , rubs or gallops Abdomen soft, mildly tender at the right upper quadrant, bowel sounds x4. No hepatosplenomegaly Extremities no clubbing cyanosis,trace edema left lower extremity .  SKIN: No  petechial rash. Neuro: No focal or motor deficits.  LABORATORY/RADIOLOGY DATA:   Recent Labs Lab 06/02/13 1645 06/03/13 0354 06/04/13 0445 06/04/13 0907  WBC 10.0 7.4 6.7 5.2  HGB 10.4* 8.9* 8.4* 8.1*  HCT 32.3* 26.7* 25.6* 24.7*  PLT 369 287 276 249  MCV 84.8 84.2 84.8 85.2  MCH 27.3 28.1 27.8 27.9  MCHC 32.2 33.3 32.8 32.8  RDW 15.3 15.4 15.7* 15.5  LYMPHSABS 0.9  --   --   --   MONOABS 0.9  --   --   --   EOSABS 0.0  --   --   --   BASOSABS 0.0  --   --   --     CMP    Recent Labs Lab 06/02/13 1645 06/03/13 0354 06/04/13 0445  NA 126* 128* 129*  K 3.4* 4.0 4.7  CL 90* 95* 97  CO2 $Re'23 23 22  'auQ$ GLUCOSE 108* 86 81  BUN $Re'11 8 7  'apH$ CREATININE 0.65 0.59 0.68  CALCIUM 10.8* 9.5 9.5  AST $Re'28 28 19  'UxY$ ALT $R'16 16 15  'AS$ ALKPHOS 918* 874* 734*  BILITOT 2.6* 2.4* 2.3*        Component Value Date/Time   BILITOT 2.3* 06/04/2013 0445   BILITOT 2.11* 05/21/2013 1115   BILIDIR 1.9* 05/09/2013 0515   IBILI 1.0* 05/09/2013 0515     Recent Labs Lab 06/04/13 0907  INR 1.80*     Liver Function Tests:  Recent Labs Lab 06/02/13 1645 06/03/13 0354 06/04/13 0445  AST $Re'28 28 19  'DkV$ ALT $R'16 16 15  'II$ ALKPHOS 918* 874* 734*  BILITOT 2.6* 2.4* 2.3*  PROT 7.5 6.0 5.8*  ALBUMIN 2.0* 1.6* 1.5*    Recent Labs Lab 06/02/13 1645  LIPASE 8*   Cardiac Enzymes:  Recent Labs Lab 06/02/13 [place] <0.30     Radiology Studies: I reviewed the imaging study myself.  Ct Chest W Contrast  05/23/2013   CLINICAL DATA:  Cervical cancer.  EXAM: CT CHEST, ABDOMEN, AND PELVIS WITH CONTRAST  TECHNIQUE: Multidetector CT imaging of the chest, abdomen and pelvis was  performed following the standard protocol during bolus administration  of intravenous contrast.  CONTRAST:  154mL OMNIPAQUE IOHEXOL 300 MG/ML  SOLN  COMPARISON:  Abdomen and pelvis CT 05/05/2013. Chest CT from 04/05/2013.  FINDINGS: CT CHEST FINDINGS  The tip of the right-sided Port-A-Cath is positioned in the upper right atrium.  There is no axillary lymphadenopathy. No mediastinal or hilar lymphadenopathy. The heart is enlarged. No pericardial effusion. Bilateral small pleural effusions are evident.  Right middle and lower lobe subsegmental atelectasis is evident. Subsegmental atelectasis is noted in the left lower lobe and lingula. No focal airspace consolidation. No pulmonary edema. No parenchymal mass lesion.  Bone windows reveal no worrisome lytic or sclerotic osseous lesions.   Ct Abdomen Pelvis W Contrast  05/23/2013     CT ABDOMEN AND PELVIS FINDINGS  The large lesion involving the medial segment left liver measures 8.0 x 8.5 cm today compared to 7.4 x 6.5 cm when remeasured at the same level and in the same axes on the previous exam. The posterior right hepatic lobe lesion measured previously at 4.9 cm now measures 5.5 cm. 4.3 cm lesion in the medial dome of the liver, adjacent to the IVC, was 3.3 cm on the previous study.  Two percutaneous biliary drains are identified with the distal tip of each coiled in the duodenum lumen. The presence of pneumobilia is compatible with the presence of the drains.  Spleen is unremarkable. Contrast material in distal esophagus may be related to dysmotility or reflux. Stomach is unremarkable. Duodenum has normal imaging features. Gallbladder is surgically absent. The adrenal glands are normal. Left kidney is markedly atrophic, as before. Right kidney shows a small interpolar cyst but is otherwise unremarkable.  IVC filter is evident and there is laminar flow of opacified blood in the IVC between the filter and the right atrium, accentuated by non perfusion of the atrophic left kidney.  3.1 x 1.4 cm necrotic lymph node is identified in the  hepatoduodenal ligament. 13 mm short axis retrocaval lymph node measured on the previous study is now 14 mm in the same dimension.  Pancreas is unremarkable.  Imaging through the pelvis shows a trace amount of intraperitoneal free fluid. Bladder is decompressed. Uterus is surgically absent. There is no adnexal mass.  The left common femoral lymph node measured previously at 2.0 x 3.6 cm now measures 1.8 x 3.6 cm. Colon is unremarkable. Terminal ileum is normal. The appendix is not visualized, but there is no edema or inflammation in the region of the cecum.  Interval development of omental nodules is evident. 11 mm left omental nodule is seen on image 70. 17 mm omental nodule is seen towards the midline on image 62.  Bone windows reveal no worrisome lytic or sclerotic osseous lesions. Patient is status post vertebral augmentation at L1 and L3. Superior endplate compression deformity of the L2 and L4 vertebral bodies is stable.  IMPRESSION: Interval progression of metastatic disease in the liver.  No evidence for worsening biliary obstruction in this patient with percutaneous biliary drain placement.  Interval development of omental nodules concerning for new metastatic involvement. Slight progression of hepatoduodenal ligament and retroperitoneal lymphadenopathy.  No substantial change in the left common femoral lymph node.   Electronically Signed   By: Misty Stanley M.D.   On: 05/23/2013 16:06   ASSESSMENT AND PLAN:   1.Metastatic cervical cancer The patient has recently progressed on multiple chemotherapy agents. Patient was to receive Topotecan on 4/16. Her Port is still in place.  It was to be removed on admission after infection was suspected, but cultures to date are negative.  Her performance status is very poor and I do not think the patient would be a candidate for systemic chemotherapy in the future. I had a long discussion with her and her husband about risk, benefits, side effects of topotecan  versus palliative care such as hospice.  I recommend the patient to think about potential palliative care only.   At the end of our discussion, I do not think the patient is ready to make a decision but she did admit she had thought about not pursuing further chemotherapy.  2. Sepsis/UTI:  Cultures to date negative. Currently on Zosyn IV. Infectious disease team is following. I recommend holding off removal of port pending her decision about whether she wants to pursue further chemotherapy in the future.  3. Hyponatremia,  Likely due to dehydration. Recommend gentle IV fluid hydration as per primary service.  4. Nausea and vomiting I suspect this is due to peritoneal carcinomatosis. Her symptoms has improved a little bit with no further vomiting. Continue supportive care with IV fluids   5. History of recurrent DVT,s/p IVC filter,  On Heparin. Xarelto on hold until 4/7. 6. Anemia This is likely anemia of chronic disease. The patient denies recent history of bleeding such as epistaxis, hematuria or hematochezia. She is asymptomatic from the anemia. We will observe for now.  She does not require transfusion now.   I spent almost an hour with the patient and family. I will follow tomorrow. I recommend palliative care consult to be seeing this patient to address goal of care.  [name] Jumbo, PA-C 06/04/2013, 10:32 AM  Alvy Bimler, [name] Rix, MD 06/04/2013

## 2013-06-04 NOTE — Progress Notes (Signed)
Subjective: Patient with metastatic cervical cancer with biliary obstruction  Left Biliary drain exchanged 2/27  Righ biliary drain placed 2/27; upsized 3/11 to 12Fr Patient admitted 4/4 with overall weakness, hypotension concern for bacteremia. Patient is afebrile, urine Cx and blood Cx obtained no growth so far. Patient scheduled as outpatient 4/6 for port removal given recent cholangitis/bacteremia admission.   Objective: Physical Exam: BP 109/75  Pulse 100  Temp(Src) 97.5 F (36.4 C) (Oral)  Resp 18  Ht 5\' 1"  (1.549 m)  Wt 157 lb 10.1 oz (71.5 kg)  BMI 29.80 kg/m2  SpO2 98%  Abd: Right biliary drain dressing saturated with bile, no evidence of bleeding, thin bile 24 hr 335 cc Left biliary drain dressing C/D/I, output thin bile 24 hr 500 cc  Labs: CBC  Recent Labs  06/04/13 0445 06/04/13 0907  WBC 6.7 5.2  HGB 8.4* 8.1*  HCT 25.6* 24.7*  PLT 276 249   BMET  Recent Labs  06/03/13 0354 06/04/13 0445  NA 128* 129*  K 4.0 4.7  CL 95* 97  CO2 23 22  GLUCOSE 86 81  BUN 8 7  CREATININE 0.59 0.68  CALCIUM 9.5 9.5   LFT  Recent Labs  06/02/13 1645  06/04/13 0445  PROT 7.5  < > 5.8*  ALBUMIN 2.0*  < > 1.5*  AST 28  < > 19  ALT 16  < > 15  ALKPHOS 918*  < > 734*  BILITOT 2.6*  < > 2.3*  LIPASE 8*  --   --   < > = values in this interval not displayed. PT/INR  Recent Labs  06/04/13 0907  LABPROT 20.4*  INR 1.80*    Studies/Results: Dg Chest 2 View  06/02/2013   CLINICAL DATA:  weakness and fever  EXAM: CHEST  2 VIEW  COMPARISON:  CT CHEST W/CM dated 05/23/2013; DG CHEST 2 VIEW dated 05/04/2013  FINDINGS: Low lung volumes. Small right pleural effusion. Aerated lungs are clear. Cardiac silhouette enlarged. Right-sided porta catheter with tip projected regions superior vena cava. No gross osseous abnormalities.  IMPRESSION: Small right pleural effusion otherwise no evidence of focal infiltrates.   Electronically Signed   By: Margaree Mackintosh M.D.   On:  06/02/2013 17:07   Dg Abd 2 Views  06/02/2013   CLINICAL DATA:  Vomiting, fever and chills. Right-sided abdominal pain.  EXAM: ABDOMEN - 2 VIEW  COMPARISON:  Abdominal radiograph performed 11/23/2012, and CT of the abdomen and pelvis performed 05/23/2013  FINDINGS: The patient's two percutaneous biliary drainage catheters are again noted. The visualized bowel gas pattern is unremarkable. Scattered air and stool filled loops of colon are seen; no abnormal dilatation of small bowel loops is seen to suggest small bowel obstruction. No free intra-abdominal air is identified, though evaluation for free air is limited on a single supine view. Scattered clips are noted in the right upper quadrant. A chest port is partially imaged.  Changes of prior vertebroplasty are noted at multiple levels along the lumbar spine; the sacroiliac joints are unremarkable in appearance. The visualized lung bases are essentially clear.  IMPRESSION: Unremarkable bowel gas pattern; no free intra-abdominal air seen. Percutaneous biliary drainage catheters are unchanged in position.   Electronically Signed   By: Garald Balding M.D.   On: 06/02/2013 21:16    Assessment/Plan: Metastatic cervical cancer with biliary obstruction s/p bilateral biliary drain placement. CT 05/23/13 revealed interval progression of metastatic disease in the liver and concern for new metastatic involvement, no evidence  of worsening biliary obstruction. Continue drain care, monitor output daily, change dressing BID with alcohol skin cleansing around site, stop biliary flushes.  Hypotension, weakness concern for infection with recent cholangitis/bacteremia, scheduled as outpatient for port removal today, Dr. Pascal Lux has spoke with Dr. Alvy Bimler today who is covering for Dr. Humphrey Rolls, will hold off on port removal for now until oncology further evaluates.    LOS: 2 days    Rockney Ghee 06/04/2013 11:33 AM

## 2013-06-04 NOTE — Progress Notes (Signed)
[place] for Zosyn--> Cefepime Indication: Intra-abdominal infection  Allergies  Allergen Reactions  . Codeine Other (See Comments)    Breathing issues  . Flecainide Acetate Other (See Comments)    Leg cramping   . Morphine Other (See Comments)     Hallucinations  . Propafenone Hcl Other (See Comments)    Leg cramps    Patient Measurements: Height: 5\' 1"  (154.9 cm) Weight: 157 lb 10.1 oz (71.5 kg) IBW/kg (Calculated) : 47.8  Vital Signs: Temp: 98.6 F (37 C) (04/06 1347) Temp src: Oral (04/06 1347) BP: 126/72 mmHg (04/06 1347) Pulse Rate: 105 (04/06 1347) Intake/Output from previous day: 04/05 0701 - 04/06 0700 In: 3135.2 [P.O.:240; I.V.:2745.2; IV Piggyback:150] Out: 835 [Drains:835]  Labs:  Recent Labs  06/02/13 1645 06/03/13 0354 06/04/13 0445 06/04/13 0907  WBC 10.0 7.4 6.7 5.2  HGB 10.4* 8.9* 8.4* 8.1*  PLT 369 287 276 249  CREATININE 0.65 0.59 0.68  --    Estimated Creatinine Clearance: 66.8 ml/min (by C-G formula based on Cr of 0.68).  Microbiology  Recent Results (from the past 720 hour(s))  CULTURE, BLOOD (ROUTINE X 2)     Status: None   Collection Time    05/07/13  4:25 PM      Result Value Ref Range Status   Specimen Description BLOOD RIGHT ARM   Final   Special Requests BOTTLES DRAWN AEROBIC AND ANAEROBIC 10CC EACH   Final   Culture  Setup Time     Final   Value: 05/07/2013 20:56     Performed at [facility]   Culture     Final   Value: NO GROWTH 5 DAYS     Performed at [facility]   Report Status 05/13/2013 FINAL   Final  CULTURE, BLOOD (ROUTINE X 2)     Status: None   Collection Time    05/07/13  4:36 PM      Result Value Ref Range Status   Specimen Description BLOOD LEFT ARM   Final   Special Requests BOTTLES DRAWN AEROBIC AND ANAEROBIC 5CC EACH   Final   Culture  Setup Time     Final   Value: 05/08/2013 13:28     Performed at [facility]   Culture     Final   Value: NO  GROWTH 5 DAYS     Performed at [facility]   Report Status 05/14/2013 FINAL   Final  CLOSTRIDIUM DIFFICILE BY PCR     Status: None   Collection Time    05/08/13  5:12 AM      Result Value Ref Range Status   C difficile by pcr NEGATIVE  NEGATIVE Final   Comment: Performed at [facility]  BODY FLUID CULTURE     Status: None   Collection Time    05/08/13  2:20 PM      Result Value Ref Range Status   Specimen Description LIVER BILE FROM BILIARY DRAINS LEFT   Final   Special Requests Normal   Final   Gram Stain     Final   Value: NO WBC SEEN     NO ORGANISMS SEEN     Performed at [facility]   Culture     Final   Value: FEW ENTEROBACTER CLOACAE     Note: CRITICAL RESULT CALLED TO, READ BACK BY AND VERIFIED WITH: BESS B@10 :13AM ON 05/09/13 BY DANTS     FEW CANDIDA LUSITANIAE  Performed at [facility]   Report Status 05/13/2013 FINAL   Final   Organism ID, Bacteria ENTEROBACTER CLOACAE   Final  BODY FLUID CULTURE     Status: None   Collection Time    05/08/13  2:40 PM      Result Value Ref Range Status   Specimen Description LIVER Bile from biliary drain RIGHT   Final   Special Requests NONE   Final   Gram Stain     Final   Value: NO WBC SEEN     NO ORGANISMS SEEN     Performed at [facility]   Culture     Final   Value: FEW ENTEROBACTER CLOACAE     Note: SUSCEPTIBILITIES PERFORMED ON PREVIOUS CULTURE WITHIN THE LAST 5 DAYS. CRITICAL RESULT CALLED TO, READ BACK BY AND VERIFIED WITH: BESS B@10 :13AM ON 05/09/13 BY DANTS     FEW CANDIDA LUSITANIAE     Performed at [facility]   Report Status 05/13/2013 FINAL   Final  CULTURE, BLOOD (SINGLE)     Status: None   Collection Time    05/28/13 11:30 AM      Result Value Ref Range Status   Organism ID, Bacteria NO GROWTH 5 DAYS   Final  CULTURE, BLOOD (SINGLE)     Status: None   Collection Time    05/28/13 12:00 PM      Result Value Ref Range Status   Organism ID, Bacteria NO  GROWTH 5 DAYS   Final  URINE CULTURE     Status: None   Collection Time    06/02/13  5:11 PM      Result Value Ref Range Status   Specimen Description URINE, CATHETERIZED   Final   Special Requests NONE   Final   Culture  Setup Time     Final   Value: 06/02/2013 21:57     Performed at [place]     Final   Value: NO GROWTH     Performed at [facility]   Culture     Final   Value: NO GROWTH     Performed at [facility]   Report Status 06/03/2013 FINAL   Final  CULTURE, BLOOD (ROUTINE X 2)     Status: None   Collection Time    06/03/13 10:55 AM      Result Value Ref Range Status   Specimen Description BLOOD LEFT ARM  1 ML IN AEROBIC ONLY   Final   Special Requests Immunocompromised   Final   Culture  Setup Time     Final   Value: 06/03/2013 18:35     Performed at [facility]   Culture     Final   Value:        BLOOD CULTURE RECEIVED NO GROWTH TO DATE CULTURE WILL BE HELD FOR 5 DAYS BEFORE ISSUING A FINAL NEGATIVE REPORT     Performed at [facility]   Report Status PENDING   Incomplete  CULTURE, BLOOD (ROUTINE X 2)     Status: None   Collection Time    06/03/13 11:19 AM      Result Value Ref Range Status   Specimen Description BLOOD LEFT ARM  2 ML IN [facility] BOTTLE   Final   Special Requests Immunocompromised   Final   Culture  Setup Time     Final   Value: 06/03/2013 18:35  Performed at [facility]     Final   Value:        BLOOD CULTURE RECEIVED NO GROWTH TO DATE CULTURE WILL BE HELD FOR 5 DAYS BEFORE ISSUING A FINAL NEGATIVE REPORT     Performed at [facility]   Report Status PENDING   Incomplete     Anti-infectives: 4/4 >> vanc >> 4/5 4/4 >> Rocephin >> 4/5 4/5 >> Zosyn >>4/6 46 >> Micafungin >> 4/6 >> Cefepime >>  Assessment: 43 yoF admitted 4/4 with possible IV catheter infection and UTI.  PMH includes metastatic cervical cancer, bili obtr s/p stenting and drain Feb 2015,  chronic afib, hx DVT, recent hospitalization for sepsis, bacteremia and cholangitis.  She was initially started on IV vancomycin and ceftriaxone, then changed to zosyn for intra-abdominal infection, now adjusted per ID recommendations to Cefepime, micafungin, and flagyl due to hx of biliary infxn w/ enterobacter and c. lusitania resistant to zosyn and serratia bacteremia.    Tmax: afebrile WBCs: WNL Renal: SCr stable, CrCl 67  3/6 blood cx serratia (cefazolin R but S otherwise) 3/10 billiary cx: enterobacter cloacae ( cefazolin R, cefepime S, ceftaz R, piptazo R, cipro S, imi S, tobra/gent S, bactrim S) 4/4 urine: NG 4/5 blood x 2: NGTD  Goal of Therapy:  Appropriate abx dosing, eradication of infection.   Plan:   Cefepime 1g IV q8h  Follow up renal fxn and culture results.  Follow up plans regarding PAC replacement  Ralene Bathe, PharmD, BCPS 06/04/2013, 2:36 PM  Pager: [phone]

## 2013-06-04 NOTE — Progress Notes (Signed)
Note: This document was prepared with digital dictation and possible smart phrase technology. Any transcriptional errors that result from this process are unintentional.   [name] MVE:[id number] DOB: 09-16-1950 DOA: 06/02/2013 PCP: Gwendolyn Grant, MD  Brief narrative:  63 y/o ? 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 Htn recent Hospital stay 05/04/13-->05/15/13 Asc.Cholangitis with Serratia and C Lusietiana bacteremia completing Cipro/Fluconazole 3/24 was  re-admitted to [facility] c Potential UTI, presyncopal episode along with nausea and vomiting prompting thought of either Pyelonephritis/obstructive jaundice/advanced cervical cancer with metastases as CT scan of abdomen 05/23/13 = interval development of omental and notes concerning for new metastatic involvement with slight progression of hepatoduodenal ligament and retroperitoneum lymphadenopathy On admission had low blood pressures 90s over 60s ,Sodium 128, BUN/creatinine 8/0.5, alkaline phosphatase 874, albumin 1.6, hemoglobin 9, HCT 26, alkaline phosphatase 874 , total bilirubin 2.4, lactic acid 1.8, point-of-care troponin 0.3 Given consideration for urinary tract infection empirically Rocephin and vancomycin- Patient states that she has not felt strong and does not think that she recovered from the ascending cholangitis from last time overall just feels weak and is drinking more than she is eating and eating very little overall. Per creams have been draining fairly well left greater than right without decreased output.  She has chronic left lower extremity lymphedema   Past medical history-As per Problem list Chart reviewed as below-  Consultants:   interventional radiology  Oncology   Procedures: acute abdominal series    acute abdominal series  06/02/13  Chest x-ray 06/02/48   Antibiotics:   Rocephin 06/02/13 -06/03/13   Zosyn 06/03/13   Vancomycin 06/02/13-06/03/13   Micafungin 06/04/13   Subjective   doing fair-views a little better than yesterday Still some mild abdominal discomfort No fever no chills No burning in the urine Tolerating some diet Nursing reports her rates in the 150 range Patient asking if she can see her oncologist    Objective    Interim History:  reviewed  Telemetry:  atrial fibrillation rates in 150 range   Objective: Filed Vitals:   06/04/13 0542 06/04/13 0544 06/04/13 0545 06/04/13 0854  BP: 103/70 107/78 129/99 109/75  Pulse: 110   100  Temp: 98.1 F (36.7 C)   97.5 F (36.4 C)  TempSrc: Oral   Oral  Resp: 16   18  Height:      Weight:   71.5 kg (157 lb 10.1 oz)   SpO2: 98%   98%    Intake/Output Summary (Last 24 hours) at 06/04/13 1127 Last data filed at 06/04/13 0600  Gross per 24 hour  Intake 3135.15 ml  Output    835 ml  Net 2300.15 ml    Exam:  General:  alert, tired-appearing pleasant Caucasian female  Cardiovascular:  Tachycardic, S1-S2 atrial fibrillation noted Respiratory: Clinically clear no added sounds Abdomen: Nondistended however multiple drains in place, right upper quadrant discomfort on palpation Skin no lower extremity edema but some lymphedema of the left lower extremity which is chronic Neuro intact  Data Reviewed: Basic Metabolic Panel:  Recent Labs Lab 06/02/13 1645 06/03/13 0354 06/04/13 0445  NA 126* 128* 129*  K 3.4* 4.0 4.7  CL 90* 95* 97  CO2 $Re'23 23 22  'xIH$ GLUCOSE 108* 86 81  BUN $Re'11 8 7  'QDG$ CREATININE  0.65 0.59 0.68  CALCIUM 10.8* 9.5 9.5   Liver Function Tests:  Recent Labs Lab 06/02/13 1645 06/03/13 0354 06/04/13 0445  AST $Re'28 28 19  'LOk$ ALT $R'16 16 15  'VG$ ALKPHOS 918* 874* 734*  BILITOT 2.6* 2.4* 2.3*  PROT 7.5 6.0 5.8*  ALBUMIN 2.0* 1.6* 1.5*    Recent Labs Lab 06/02/13 1645  LIPASE 8*   No results found for this basename: AMMONIA,   in the last 168 hours CBC:  Recent Labs Lab 06/02/13 1645 06/03/13 0354 06/04/13 0445 06/04/13 0907  WBC 10.0 7.4 6.7 5.2  NEUTROABS 8.2*  --   --   --   HGB 10.4* 8.9* 8.4* 8.1*  HCT 32.3* 26.7* 25.6* 24.7*  MCV 84.8 84.2 84.8 85.2  PLT 369 287 276 249   Cardiac Enzymes:  Recent Labs Lab 06/02/13 1645  TROPONINI <0.30   BNP: No components found with this basename: POCBNP,  CBG: No results found for this basename: GLUCAP,  in the last 168 hours  Recent Results (from the past 240 hour(s))  CULTURE, BLOOD (SINGLE)     Status: None   Collection Time    05/28/13 11:30 AM      Result Value Ref Range Status   Organism ID, Bacteria NO GROWTH 5 DAYS   Final  CULTURE, BLOOD (SINGLE)     Status: None   Collection Time    05/28/13 12:00 PM      Result Value Ref Range Status   Organism ID, Bacteria NO GROWTH 5 DAYS   Final  URINE CULTURE     Status: None   Collection Time    06/02/13  5:11 PM      Result Value Ref Range Status   Specimen Description URINE, CATHETERIZED   Final   Special Requests NONE   Final   Culture  Setup Time     Final   Value: 06/02/2013 21:57     Performed at [facility]     Final   Value: NO GROWTH     Performed at [facility]   Culture     Final   Value: NO GROWTH     Performed at [facility]   Report Status 06/03/2013 FINAL   Final  CULTURE, BLOOD (ROUTINE X 2)     Status: None   Collection Time    06/03/13 10:55 AM      Result Value Ref Range Status   Specimen Description BLOOD LEFT ARM  1 ML IN AEROBIC ONLY   Final   Special Requests Immunocompromised   Final   Culture  Setup Time     Final   Value: 06/03/2013 18:35     Performed at [facility]   Culture     Final   Value:        BLOOD CULTURE RECEIVED NO GROWTH TO DATE CULTURE WILL BE HELD FOR 5 DAYS BEFORE ISSUING A FINAL NEGATIVE REPORT     Performed at [facility]   Report Status PENDING   Incomplete  CULTURE, BLOOD  (ROUTINE X 2)     Status: None   Collection Time    06/03/13 11:19 AM      Result Value Ref Range Status   Specimen Description BLOOD LEFT ARM  2 ML IN [facility] BOTTLE   Final   Special Requests Immunocompromised   Final   Culture  Setup Time     Final   Value: 06/03/2013 18:35  Performed at [facility]     Final   Value:        BLOOD CULTURE RECEIVED NO GROWTH TO DATE CULTURE WILL BE HELD FOR 5 DAYS BEFORE ISSUING A FINAL NEGATIVE REPORT     Performed at [facility]   Report Status PENDING   Incomplete     Studies:              All Imaging reviewed and is as per above notation   Scheduled Meds: .  ceFAZolin (ANCEF) IV  2 g Intravenous On Call  . diltiazem  180 mg Oral Daily  . docusate sodium  100 mg Oral BID  . fentaNYL  200 mcg Transdermal Q72H  . loratadine  10 mg Oral Daily  . metoprolol  12.5 mg Oral BID  . micafungin [facility]) IV  100 mg Intravenous Daily  . pantoprazole  40 mg Oral Daily  . piperacillin-tazobactam (ZOSYN)  IV  3.375 g Intravenous 3 times per day  . rivaroxaban  20 mg Oral Q lunch  . [START ON 06/05/2013] rivaroxaban  20 mg Oral Q supper   Continuous Infusions: . sodium chloride    . 0.9 % NaCl with KCl 40 mEq / L 100 mL/hr at 06/04/13 0736     Assessment/Plan: 1. Nausea vomiting-likely secondary to #2. 2. Potential cholangitis vs. Pyelonephritis vs. Malignancy related fever-elevated alkaline phosphatase leading me to think maybe she has an obstructive component, no dysuria, chronic right abdominal pain leads me to think small cholangitis and pyelonephritis . change IV antibiotics to Zosyn. Discussed with ID physician 06/04/13 who recommends starting Micafungin.  Blood cultures x2 negative,  urine culture negative- The more pressing concern is that she has a port that was suspected to be potential nidus for infection which may need to be removed and was scheduled to be removed on 11/01/21-FTDD is complicated by the fact that her  oncologist is out of town and it is unclear whether further chemotherapy will be given as a port may be very well be needed.  Once oncology sees the patient and a decision was made we can potentially decide on removal of this vs. not 3. Anemia of malignancy-hemoglobin has dropped from baseline of 10-8.I will transfuse one unit of blood today. We will get anemia panel--given her bilirubin trending down I would hold workup for hemolysis at this time 4. Presyncopal episode-up late as result of poor by mouth intake with excess fluid >diet. Will order her diet and see how she feels. Her beta blockers has been cut back slightly. She is also on fentanyl 200 mcg daily which may contribute to mild hypotension-order orthostatic vital signs and monitor--transfusion of blood may have 5. Euvolemic hyponatremia with possible tea/toast potomania the goalsecondary to not eating-increase solid diet, hydrate 100 cc per hour.  Baseline sodium is 134. 6. Atrial fibrillation with rapid ventricular response-rate in the 150scontinue Cardizem 180 mg daily, metoprolol given a lower dose 12.5 twice a day-restart Xarelto-if her rate does not come down or hypertension limits rate controlling agents we will need to speak to cardiology, and she has some deranged LFTs, which might preclude amiodarone use also albumin is low 4 digoxinuse 7. Metastatic cervical cancer-supposed to start new Chemo regimen, Topotecan-Appreciate Oncology input in advance-CT scan from 3/25 showed spread to the omentum and slight progression of hepatoduodenal ligament lymphadenopathy--deferred restaging workup to oncology and their input regarding care I appreciate this in advance 8. Hypokalemia potassium has been replaced  9. Adult failure to thrive-as above 10. Prior DVTs left lower extremity 10/2010, 04/2011 status post IVC filter-Restarted Xarelto 06/04/48  Code Status: Full  Family Communication: discussed with patient at bedside Disposition Plan:  Inpatient   Verneita Griffes, MD  [facility] Pager [phone] 06/04/2013, 11:27 AM    LOS: 2 days

## 2013-06-04 NOTE — Consult Note (Signed)
[facility]  Total days of antibiotics 3        Day 2 piptazo               Reason for Consult: fever in immunocompromised host    Referring Physician: samtani  Principal Problem:   Nausea and vomiting Active Problems:   Atrial fibrillation   Cervical cancer   Anemia of chronic disease   Alkaline phosphatase elevation   Malignant obstructive jaundice   Fever and chills   Hyponatremia   Syncope and collapse   Leg edema, left   Orthostatic hypotension   UTI (urinary tract infection)    HPI: [name] is a 63 y.o. femalewith a history of metastatic cervical cancer since 2005 -status post total hysterectomy with recurrence in 2010-status post radiation treatment with metastatic spread to the liver since 2012; biliary obstruction-status post stenting with drain placement by Dr. Kathlene Cote on the left and the right in February 2015;  She was recently hospitalized from 05/04/13 through 05/15/13 for sepsis fro ascending cholangitis with serratia ( R cefazolin, otherwise pansensitive) bacteremia plus enterobacter & c.lusietania isolated from biliary culture. She completed the course of outpatient Cipro and fluconazole on 05/22/13. She had repeat surveillance blood cx on 3/30 which were no growth to date. She did start having worsening nausea, anorexia, syncope and high fever of 101.7. She denies worsening abdominal pain, just at baseline, no dysuria. On admit, her WBC mildly elevated at 10 with mild left shift of 82%N, BMP showed hyponatremia of 126, alk phos increasingly elevated up to 918 from 665  A week prior. ua showed few wbc but positive nitrite/le. Blood cx drawn on 4/5, a day after admit which are NGTD, urine CX are NGTD at 48hrs. She was started on vanco and ceftriaxone but changed to piptazo for possible GI source of infection.   Past Medical History  Diagnosis Date  . Diastolic dysfunction   . MIGRAINE HEADACHE   . Atrial fibrillation     failed DCCV,  CHADs2-prev pradaxa stopped due to hematuria with ureter issues/JJ   . Anemia   . HTN (hypertension)   . GLAUCOMA   . CARPAL TUNNEL SYNDROME, RIGHT   . Lymphedema     L>R leg from pelvic XRT/scarring  . Hepatic steatosis     CT scan 06/2009, 12/2009  . DVT (deep venous thrombosis) 10/2010    LLE, anticoag resumed  . CHF (congestive heart failure)     1 yr ago per pt  . Arthritis   . GERD (gastroesophageal reflux disease)   . Small kidney     left  . Radiation 10/11/11-11/19/11    5040 cGy in 28 fx's/periaortic  . Radiation 03/16/10-03/25/10    right inguinal node/left external iliac node  . Radiation 06/11/08-07/23/08    6000 cGy left pelvis  . Hyperlipidemia   . Leg swelling     left  . Cervical cancer dx 04/2003, recur 04/2008    s/p debulk (TAHBSO), chemo/XRT; recurrent dz L pelvis dx 2010 and liver met 09/2010 s/p RFA  . Osteoporosis with fracture 2014    compression fx, s/p KP ; started prolia 06/2012- narcotic pain mgmt  . Ascending cholangitis 05/04/2013  . Gram-negative bacteremia 05/06/2013  . Malignant obstructive jaundice 04/27/2013    Allergies:  Allergies  Allergen Reactions  . Codeine Other (See Comments)    Breathing issues  . Flecainide Acetate Other (See Comments)    Leg cramping   .  Morphine Other (See Comments)     Hallucinations  . Propafenone Hcl Other (See Comments)    Leg cramps    MEDICATIONS: . acetaminophen  650 mg Oral Once  .  ceFAZolin (ANCEF) IV  2 g Intravenous On Call  . diltiazem  180 mg Oral Daily  . diphenhydrAMINE  25 mg Oral Once  . docusate sodium  100 mg Oral BID  . fentaNYL  200 mcg Transdermal Q72H  . furosemide  20 mg Intravenous Once  . loratadine  10 mg Oral Daily  . metoprolol  12.5 mg Oral BID  . micafungin [facility]) IV  100 mg Intravenous Daily  . pantoprazole  40 mg Oral Daily  . piperacillin-tazobactam (ZOSYN)  IV  3.375 g Intravenous 3 times per day  . rivaroxaban  20 mg Oral Q lunch  . [START ON 06/05/2013] rivaroxaban  20  mg Oral Q supper    History  Substance Use Topics  . Smoking status: Former Smoker -- 0.50 packs/day for 3 years    Quit date: 03/01/1984  . Smokeless tobacco: Never Used  . Alcohol Use: No    Family History  Problem Relation Age of Onset  . Lung cancer    . Hypertension    . Heart disease    . Stroke    . Asthma    . Asthma Son   . Cancer Mother     lung  . Stroke Father     Review of Systems  Constitutional: positive for low grade fever. chills, diaphoresis, activity change, appetite change, fatigue and unexpected weight change.  HENT: Negative for congestion, sore throat, rhinorrhea, sneezing, trouble swallowing and sinus pressure.  Eyes: Negative for photophobia and visual disturbance.  Respiratory: Negative for cough, chest tightness, shortness of breath, wheezing and stridor.  Cardiovascular: Negative for chest pain, palpitations and leg swelling.  Gastrointestinal: abdominal pain+.Negative for nausea, vomiting, abdominal pain, diarrhea, constipation, blood in stool, abdominal distention and anal bleeding.  Genitourinary: Negative for dysuria, hematuria, flank pain and difficulty urinating.  Musculoskeletal: Negative for myalgias, back pain, joint swelling, arthralgias and gait problem.  Skin: Negative for color change, pallor, rash and wound.  Neurological: Negative for dizziness, tremors, weakness and light-headedness.  Hematological: Negative for adenopathy. Does not bruise/bleed easily.  Psychiatric/Behavioral: Negative for behavioral problems, confusion, sleep disturbance, dysphoric mood, decreased concentration and agitation.     OBJECTIVE: Temp:  [97.3 F (36.3 C)-98.6 F (37 C)] 98.6 F (37 C) (04/06 1347) Pulse Rate:  [87-110] 105 (04/06 1347) Resp:  [16-18] 18 (04/06 1347) BP: (103-129)/(62-99) 126/72 mmHg (04/06 1347) SpO2:  [98 %-99 %] 99 % (04/06 1347) Weight:  [157 lb 10.1 oz (71.5 kg)] 157 lb 10.1 oz (71.5 kg) (04/06 0545)  Constitutional:   oriented to person, place, and time. appears well-developed and well-nourished. No distress. Fatigue appearing. HENT:  Mouth/Throat: Oropharynx is clear and moist. No oropharyngeal exudate.  Chest wall: port in place Cardiovascular: Normal rate, regular rhythm and normal heart sounds. Exam reveals no gallop and no friction rub.  No murmur heard.  Pulmonary/Chest: Effort normal and breath sounds normal. No respiratory distress.  has no wheezes.  Abdominal: Soft. Bowel sounds are normal.  exhibits no distension. There is mild diffuse tenderness. Biliary drains in place Lymphadenopathy: no cervical adenopathy.  Neurological: alert and oriented to person, place, and time.  Skin: Skin is warm and dry. No rash noted. No erythema.  Psychiatric: a normal mood and affect.  behavior is normal.   LABS: Results  for orders placed during the hospital encounter of 06/02/13 (from the past 48 hour(s))  CBC WITH DIFFERENTIAL     Status: Abnormal   Collection Time    06/02/13  4:45 PM      Result Value Ref Range   WBC 10.0  4.0 - 10.5 K/uL   RBC 3.81 (*) 3.87 - 5.11 MIL/uL   Hemoglobin 10.4 (*) 12.0 - 15.0 g/dL   HCT 32.3 (*) 36.0 - 46.0 %   MCV 84.8  78.0 - 100.0 fL   MCH 27.3  26.0 - 34.0 pg   MCHC 32.2  30.0 - 36.0 g/dL   RDW 15.3  11.5 - 15.5 %   Platelets 369  150 - 400 K/uL   Neutrophils Relative % 82 (*) 43 - 77 %   Neutro Abs 8.2 (*) 1.7 - 7.7 K/uL   Lymphocytes Relative 9 (*) 12 - 46 %   Lymphs Abs 0.9  0.7 - 4.0 K/uL   Monocytes Relative 9  3 - 12 %   Monocytes Absolute 0.9  0.1 - 1.0 K/uL   Eosinophils Relative 0  0 - 5 %   Eosinophils Absolute 0.0  0.0 - 0.7 K/uL   Basophils Relative 0  0 - 1 %   Basophils Absolute 0.0  0.0 - 0.1 K/uL  COMPREHENSIVE METABOLIC PANEL     Status: Abnormal   Collection Time    06/02/13  4:45 PM      Result Value Ref Range   Sodium 126 (*) 137 - 147 mEq/L   Potassium 3.4 (*) 3.7 - 5.3 mEq/L   Chloride 90 (*) 96 - 112 mEq/L   CO2 23  19 - 32 mEq/L    Glucose, Bld 108 (*) 70 - 99 mg/dL   BUN 11  6 - 23 mg/dL   Creatinine, Ser 0.65  0.50 - 1.10 mg/dL   Calcium 10.8 (*) 8.4 - 10.5 mg/dL   Total Protein 7.5  6.0 - 8.3 g/dL   Albumin 2.0 (*) 3.5 - 5.2 g/dL   AST 28  0 - 37 U/L   ALT 16  0 - 35 U/L   Alkaline Phosphatase 918 (*) 39 - 117 U/L   Total Bilirubin 2.6 (*) 0.3 - 1.2 mg/dL   GFR calc non Af Amer >90  >90 mL/min   GFR calc Af Amer >90  >90 mL/min   Comment: (NOTE)     The eGFR has been calculated using the CKD EPI equation.     This calculation has not been validated in all clinical situations.     eGFR's persistently <90 mL/min signify possible Chronic Kidney     Disease.  LIPASE, BLOOD     Status: Abnormal   Collection Time    06/02/13  4:45 PM      Result Value Ref Range   Lipase 8 (*) 11 - 59 U/L  TROPONIN I     Status: None   Collection Time    06/02/13  4:45 PM      Result Value Ref Range   Troponin I <0.30  <0.30 ng/mL   Comment:            Due to the release kinetics of cTnI,     a negative result within the first hours     of the onset of symptoms does not rule out     myocardial infarction with certainty.     If myocardial infarction is still suspected,  repeat the test at appropriate intervals.  I-STAT CG4 LACTIC ACID, ED     Status: None   Collection Time    06/02/13  5:02 PM      Result Value Ref Range   Lactic Acid, Venous 1.82  0.5 - 2.2 mmol/L  URINALYSIS, ROUTINE W REFLEX MICROSCOPIC     Status: Abnormal   Collection Time    06/02/13  5:11 PM      Result Value Ref Range   Color, Urine ORANGE (*) YELLOW   Comment: BIOCHEMICALS MAY BE AFFECTED BY COLOR   APPearance CLOUDY (*) CLEAR   Specific Gravity, Urine 1.028  1.005 - 1.030   pH 6.0  5.0 - 8.0   Glucose, UA NEGATIVE  NEGATIVE mg/dL   Hgb urine dipstick NEGATIVE  NEGATIVE   Bilirubin Urine MODERATE (*) NEGATIVE   Ketones, ur NEGATIVE  NEGATIVE mg/dL   Protein, ur 30 (*) NEGATIVE mg/dL   Urobilinogen, UA 1.0  0.0 - 1.0 mg/dL   Nitrite  POSITIVE (*) NEGATIVE   Leukocytes, UA SMALL (*) NEGATIVE  URINE CULTURE     Status: None   Collection Time    06/02/13  5:11 PM      Result Value Ref Range   Specimen Description URINE, CATHETERIZED     Special Requests NONE     Culture  Setup Time       Value: 06/02/2013 21:57     Performed at [facility]       Value: NO GROWTH     Performed at [facility]   Culture       Value: NO GROWTH     Performed at [facility]   Report Status 06/03/2013 FINAL    URINE MICROSCOPIC-ADD ON     Status: None   Collection Time    06/02/13  5:11 PM      Result Value Ref Range   WBC, UA 0-2  <3 WBC/hpf   Bacteria, UA RARE  RARE   Urine-Other AMORPHOUS URATES/PHOSPHATES    COMPREHENSIVE METABOLIC PANEL     Status: Abnormal   Collection Time    06/03/13  3:54 AM      Result Value Ref Range   Sodium 128 (*) 137 - 147 mEq/L   Potassium 4.0  3.7 - 5.3 mEq/L   Chloride 95 (*) 96 - 112 mEq/L   CO2 23  19 - 32 mEq/L   Glucose, Bld 86  70 - 99 mg/dL   BUN 8  6 - 23 mg/dL   Creatinine, Ser 0.59  0.50 - 1.10 mg/dL   Calcium 9.5  8.4 - 10.5 mg/dL   Total Protein 6.0  6.0 - 8.3 g/dL   Albumin 1.6 (*) 3.5 - 5.2 g/dL   AST 28  0 - 37 U/L   ALT 16  0 - 35 U/L   Alkaline Phosphatase 874 (*) 39 - 117 U/L   Total Bilirubin 2.4 (*) 0.3 - 1.2 mg/dL   GFR calc non Af Amer >90  >90 mL/min   GFR calc Af Amer >90  >90 mL/min   Comment: (NOTE)     The eGFR has been calculated using the CKD EPI equation.     This calculation has not been validated in all clinical situations.     eGFR's persistently <90 mL/min signify possible Chronic Kidney     Disease.  HEPARIN LEVEL (UNFRACTIONATED)     Status: None   Collection Time  06/03/13  3:54 AM      Result Value Ref Range   Heparin Unfractionated 0.66  0.30 - 0.70 IU/mL   Comment:            IF HEPARIN RESULTS ARE BELOW     EXPECTED VALUES, AND PATIENT     DOSAGE HAS BEEN CONFIRMED,     SUGGEST FOLLOW UP TESTING       OF ANTITHROMBIN III LEVELS.  CBC     Status: Abnormal   Collection Time    06/03/13  3:54 AM      Result Value Ref Range   WBC 7.4  4.0 - 10.5 K/uL   RBC 3.17 (*) 3.87 - 5.11 MIL/uL   Hemoglobin 8.9 (*) 12.0 - 15.0 g/dL   Comment: REPEATED TO VERIFY   HCT 26.7 (*) 36.0 - 46.0 %   MCV 84.2  78.0 - 100.0 fL   MCH 28.1  26.0 - 34.0 pg   MCHC 33.3  30.0 - 36.0 g/dL   RDW 15.4  11.5 - 15.5 %   Platelets 287  150 - 400 K/uL  APTT     Status: Abnormal   Collection Time    06/03/13  8:09 AM      Result Value Ref Range   aPTT 67 (*) 24 - 37 seconds   Comment:            IF BASELINE aPTT IS ELEVATED,     SUGGEST PATIENT RISK ASSESSMENT     BE USED TO DETERMINE APPROPRIATE     ANTICOAGULANT THERAPY.  HEPARIN LEVEL (UNFRACTIONATED)     Status: None   Collection Time    06/03/13  8:09 AM      Result Value Ref Range   Heparin Unfractionated 0.39  0.30 - 0.70 IU/mL   Comment:            IF HEPARIN RESULTS ARE BELOW     EXPECTED VALUES, AND PATIENT     DOSAGE HAS BEEN CONFIRMED,     SUGGEST FOLLOW UP TESTING     OF ANTITHROMBIN III LEVELS.  CULTURE, BLOOD (ROUTINE X 2)     Status: None   Collection Time    06/03/13 10:55 AM      Result Value Ref Range   Specimen Description BLOOD LEFT ARM  1 ML IN AEROBIC ONLY     Special Requests Immunocompromised     Culture  Setup Time       Value: 06/03/2013 18:35     Performed at [facility]   Culture       Value:        BLOOD CULTURE RECEIVED NO GROWTH TO DATE CULTURE WILL BE HELD FOR 5 DAYS BEFORE ISSUING A FINAL NEGATIVE REPORT     Performed at [facility]   Report Status PENDING    CULTURE, BLOOD (ROUTINE X 2)     Status: None   Collection Time    06/03/13 11:19 AM      Result Value Ref Range   Specimen Description BLOOD LEFT ARM  2 ML IN [facility] BOTTLE     Special Requests Immunocompromised     Culture  Setup Time       Value: 06/03/2013 18:35     Performed at [facility]   Culture       Value:         BLOOD CULTURE RECEIVED NO GROWTH TO DATE CULTURE WILL BE HELD FOR 5 DAYS  BEFORE ISSUING A FINAL NEGATIVE REPORT     Performed at [facility]   Report Status PENDING    HEPARIN LEVEL (UNFRACTIONATED)     Status: Abnormal   Collection Time    06/03/13  2:06 PM      Result Value Ref Range   Heparin Unfractionated 0.27 (*) 0.30 - 0.70 IU/mL   Comment:            IF HEPARIN RESULTS ARE BELOW     EXPECTED VALUES, AND PATIENT     DOSAGE HAS BEEN CONFIRMED,     SUGGEST FOLLOW UP TESTING     OF ANTITHROMBIN III LEVELS.  HEPARIN LEVEL (UNFRACTIONATED)     Status: Abnormal   Collection Time    06/03/13  9:30 PM      Result Value Ref Range   Heparin Unfractionated 0.29 (*) 0.30 - 0.70 IU/mL   Comment:            IF HEPARIN RESULTS ARE BELOW     EXPECTED VALUES, AND PATIENT     DOSAGE HAS BEEN CONFIRMED,     SUGGEST FOLLOW UP TESTING     OF ANTITHROMBIN III LEVELS.  CBC     Status: Abnormal   Collection Time    06/04/13  4:45 AM      Result Value Ref Range   WBC 6.7  4.0 - 10.5 K/uL   RBC 3.02 (*) 3.87 - 5.11 MIL/uL   Hemoglobin 8.4 (*) 12.0 - 15.0 g/dL   HCT 25.6 (*) 36.0 - 46.0 %   MCV 84.8  78.0 - 100.0 fL   MCH 27.8  26.0 - 34.0 pg   MCHC 32.8  30.0 - 36.0 g/dL   RDW 15.7 (*) 11.5 - 15.5 %   Platelets 276  150 - 400 K/uL  COMPREHENSIVE METABOLIC PANEL     Status: Abnormal   Collection Time    06/04/13  4:45 AM      Result Value Ref Range   Sodium 129 (*) 137 - 147 mEq/L   Potassium 4.7  3.7 - 5.3 mEq/L   Chloride 97  96 - 112 mEq/L   CO2 22  19 - 32 mEq/L   Glucose, Bld 81  70 - 99 mg/dL   BUN 7  6 - 23 mg/dL   Creatinine, Ser 0.68  0.50 - 1.10 mg/dL   Calcium 9.5  8.4 - 10.5 mg/dL   Total Protein 5.8 (*) 6.0 - 8.3 g/dL   Albumin 1.5 (*) 3.5 - 5.2 g/dL   AST 19  0 - 37 U/L   ALT 15  0 - 35 U/L   Alkaline Phosphatase 734 (*) 39 - 117 U/L   Total Bilirubin 2.3 (*) 0.3 - 1.2 mg/dL   GFR calc non Af Amer >90  >90 mL/min   GFR calc Af Amer >90  >90 mL/min    Comment: (NOTE)     The eGFR has been calculated using the CKD EPI equation.     This calculation has not been validated in all clinical situations.     eGFR's persistently <90 mL/min signify possible Chronic Kidney     Disease.  APTT     Status: Abnormal   Collection Time    06/04/13  9:07 AM      Result Value Ref Range   aPTT 47 (*) 24 - 37 seconds   Comment:            IF BASELINE aPTT IS  ELEVATED,     SUGGEST PATIENT RISK ASSESSMENT     BE USED TO DETERMINE APPROPRIATE     ANTICOAGULANT THERAPY.  CBC     Status: Abnormal   Collection Time    06/04/13  9:07 AM      Result Value Ref Range   WBC 5.2  4.0 - 10.5 K/uL   RBC 2.90 (*) 3.87 - 5.11 MIL/uL   Hemoglobin 8.1 (*) 12.0 - 15.0 g/dL   HCT 24.7 (*) 36.0 - 46.0 %   MCV 85.2  78.0 - 100.0 fL   MCH 27.9  26.0 - 34.0 pg   MCHC 32.8  30.0 - 36.0 g/dL   RDW 15.5  11.5 - 15.5 %   Platelets 249  150 - 400 K/uL  PROTIME-INR     Status: Abnormal   Collection Time    06/04/13  9:07 AM      Result Value Ref Range   Prothrombin Time 20.4 (*) 11.6 - 15.2 seconds   INR 1.80 (*) 0.00 - 1.49  TYPE AND SCREEN     Status: None   Collection Time    06/04/13 12:05 PM      Result Value Ref Range   ABO/RH(D) A POS     Antibody Screen POS     Sample Expiration 06/07/2013     DAT, IgG POS    RETICULOCYTES     Status: Abnormal   Collection Time    06/04/13 12:10 PM      Result Value Ref Range   Retic Ct Pct 2.0  0.4 - 3.1 %   RBC. 3.50 (*) 3.87 - 5.11 MIL/uL   Retic Count, Manual 70.0  19.0 - 186.0 K/uL    MICRO:  IMAGING: Dg Chest 2 View  06/02/2013   CLINICAL DATA:  weakness and fever  EXAM: CHEST  2 VIEW  COMPARISON:  CT CHEST W/CM dated 05/23/2013; DG CHEST 2 VIEW dated 05/04/2013  FINDINGS: Low lung volumes. Small right pleural effusion. Aerated lungs are clear. Cardiac silhouette enlarged. Right-sided porta catheter with tip projected regions superior vena cava. No gross osseous abnormalities.  IMPRESSION: Small right pleural  effusion otherwise no evidence of focal infiltrates.   Electronically Signed   By: Margaree Mackintosh M.D.   On: 06/02/2013 17:07   Dg Abd 2 Views  06/02/2013   CLINICAL DATA:  Vomiting, fever and chills. Right-sided abdominal pain.  EXAM: ABDOMEN - 2 VIEW  COMPARISON:  Abdominal radiograph performed 11/23/2012, and CT of the abdomen and pelvis performed 05/23/2013  FINDINGS: The patient's two percutaneous biliary drainage catheters are again noted. The visualized bowel gas pattern is unremarkable. Scattered air and stool filled loops of colon are seen; no abnormal dilatation of small bowel loops is seen to suggest small bowel obstruction. No free intra-abdominal air is identified, though evaluation for free air is limited on a single supine view. Scattered clips are noted in the right upper quadrant. A chest port is partially imaged.  Changes of prior vertebroplasty are noted at multiple levels along the lumbar spine; the sacroiliac joints are unremarkable in appearance. The visualized lung bases are essentially clear.  IMPRESSION: Unremarkable bowel gas pattern; no free intra-abdominal air seen. Percutaneous biliary drainage catheters are unchanged in position.   Electronically Signed   By: Garald Balding M.D.   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 She is readmitted for fever, plus syncope, possibly due to dehydration. Started on empiric antibiotics  - since she had hx of enterobacter and c.lusitania that was resistant to piptazo, recommend to change to  cefepime plus IV metronidazole plus  Micafungin.  - recommend to discuss with hem onc to remove port if blood cultures are positive.   Thus far cultures do not suggests this.  - will wait to hear on family decision on palliative care.   Elzie Rings [facility] [phone]

## 2013-06-04 NOTE — Progress Notes (Signed)
INITIAL NUTRITION ASSESSMENT  DOCUMENTATION CODES Per approved criteria  -Not Applicable   INTERVENTION: - [employer] Instant Breakfast TID - Had detailed discussion about strategies to help with changes in taste and smell and ways to increase calorie/protein intake. Handouts on both topics provided. [place] RD contact information provided. - RD to continue to monitor   NUTRITION DIAGNOSIS: Inadequate oral intake related to poor appetite, changes in taste and smell as evidenced by pt report.   Goal: Pt to consume >90% of meals/supplements  Monitor:  Weights, labs, intake, further nausea/vomiting  Reason for Assessment: Malnutrition screening tool   63 y.o. female  Admitting Dx: Nausea and vomiting  ASSESSMENT: Admitted with generalized weakness, fever, nausea, and vomiting, passed out 06/01/13. Hx of metastatic cervical CA. Seen by inpatient RD during admission last month.   Met with pt and friend who report pt eating very little amounts of full liquids starting Wednesday PTA due to having "little bits of nausea" and episode of vomiting Wednesday and Saturday morning. Intake has consisted of water, 2 [employer], and some soup. Reports nausea has resolved today. Weight up and down from edema. C/o significant weakness and changes in taste and smell despite it being a long time since she had chemotherapy.   Total bilirubin elevated Alk phos elevated   Nutrition Focused Physical Exam:  Subcutaneous Fat:  Orbital Region: WNL Upper Arm Region: moderate muscle wasting Thoracic and Lumbar Region: WNL  Muscle:  Temple Region: WNL Clavicle Bone Region: WNL Clavicle and Acromion Bone Region: WNL Scapular Bone Region: NA Dorsal Hand: WNL Patellar Region: NA Anterior Thigh Region: NA Posterior Calf Region: NA  Edema: Non-pitting RLE edema, +1 LLE edema    Height: Ht Readings from Last 1 Encounters:  06/02/13 $RemoveB'5\' 1"'MmWNrnfJ$  (1.549 m)     Weight: Wt Readings from Last 1 Encounters:  06/04/13 157 lb 10.1 oz (71.5 kg)    Ideal Body Weight: 105 lbs  % Ideal Body Weight: 150%  Wt Readings from Last 10 Encounters:  06/04/13 157 lb 10.1 oz (71.5 kg)  05/22/13 162 lb (73.483 kg)  05/21/13 163 lb (73.936 kg)  05/21/13 164 lb (74.39 kg)  05/15/13 166 lb 14.4 oz (75.705 kg)  05/03/13 160 lb 6.4 oz (72.757 kg)  04/27/13 158 lb (71.668 kg)  04/19/13 158 lb 9.6 oz (71.94 kg)  04/16/13 155 lb (70.308 kg)  04/10/13 160 lb 11.2 oz (72.893 kg)    Usual Body Weight: 160 lbs per pt  % Usual Body Weight: 98%  BMI:  Body mass index is 29.8 kg/(m^2).  Estimated Nutritional Needs: Kcal: 1450-1650 Protein: 60-80g Fluid: 1.4-1.6L/day  Skin: Non-pitting RLE edema, +1 LLE edema, jaundice   Diet Order: General  EDUCATION NEEDS: -Education needs addressed - discussed ways to improve energy level and strategies to help with changes in taste/smell    Intake/Output Summary (Last 24 hours) at 06/04/13 1013 Last data filed at 06/04/13 0600  Gross per 24 hour  Intake 3135.15 ml  Output    835 ml  Net 2300.15 ml    Last BM: 4/4  Labs:   Recent Labs Lab 06/02/13 1645 06/03/13 0354 06/04/13 0445  NA 126* 128* 129*  K 3.4* 4.0 4.7  CL 90* 95* 97  CO2 $Re'23 23 22  'jgw$ BUN $R'11 8 7  'Cl$ CREATININE 0.65 0.59 0.68  CALCIUM 10.8* 9.5 9.5  GLUCOSE 108* 86 81    CBG (last 3)  No results found for this basename: GLUCAP,  in  the last 72 hours  Scheduled Meds: .  ceFAZolin (ANCEF) IV  2 g Intravenous On [place]  . diltiazem  180 mg Oral Daily  . docusate sodium  100 mg Oral BID  . fentaNYL  200 mcg Transdermal Q72H  . loratadine  10 mg Oral Daily  . metoprolol  12.5 mg Oral BID  . pantoprazole (PROTONIX) IV  40 mg Intravenous Q24H  . piperacillin-tazobactam (ZOSYN)  IV  3.375 g Intravenous 3 times per day    Continuous Infusions: . sodium chloride    . 0.9 % NaCl with KCl 40 mEq / L 100 mL/hr at 06/04/13 4008    Past  Medical History  Diagnosis Date  . Diastolic dysfunction   . MIGRAINE HEADACHE   . Atrial fibrillation     failed DCCV, CHADs2-prev pradaxa stopped due to hematuria with ureter issues/JJ   . Anemia   . HTN (hypertension)   . GLAUCOMA   . CARPAL TUNNEL SYNDROME, RIGHT   . Lymphedema     L>R leg from pelvic XRT/scarring  . Hepatic steatosis     CT scan 06/2009, 12/2009  . DVT (deep venous thrombosis) 10/2010    LLE, anticoag resumed  . CHF (congestive heart failure)     1 yr ago per pt  . Arthritis   . GERD (gastroesophageal reflux disease)   . Small kidney     left  . Radiation 10/11/11-11/19/11    5040 cGy in 28 fx's/periaortic  . Radiation 03/16/10-03/25/10    right inguinal node/left external iliac node  . Radiation 06/11/08-07/23/08    6000 cGy left pelvis  . Hyperlipidemia   . Leg swelling     left  . Cervical cancer dx 04/2003, recur 04/2008    s/p debulk (TAHBSO), chemo/XRT; recurrent dz L pelvis dx 2010 and liver met 09/2010 s/p RFA  . Osteoporosis with fracture 2014    compression fx, s/p KP ; started prolia 06/2012- narcotic pain mgmt  . Ascending cholangitis 05/04/2013  . Gram-negative bacteremia 05/06/2013  . 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 Ureteral stent placement  2005, 01/2010, 07/2010    L hydro related to cervical ca and XRT  . Oophorectomy  2005  . Cardioversion    . Tonsillectomy    . Cholecystectomy  1984  . Total abdominal hysterectomy  2005  . Carpal tunnel  2009    right  . Ivc filter  04/2011    Mikey College MS, [place], [place] Pager [phone] After Hours Pager

## 2013-06-04 NOTE — Progress Notes (Signed)
Pt HR 150s sustaining with activity.  Asymptomatic. Pt assisted to bed.  BP 109/75 HR 100 Resp 18.  Oxygen saturations 98% on room air.  MD notified.  Will continue to monitor.

## 2013-06-04 NOTE — Care Management Note (Addendum)
    Page 1 of 2   06/08/2013     1:13:32 PM   CARE MANAGEMENT NOTE 06/08/2013  Patient:  [name], [name]   Account Number:  [id number]  Date Initiated:  06/04/2013  Documentation initiated by:  Dessa Phi  Subjective/Objective Assessment:   63 Y/O F ADMITTED W/WEAKNESS,FEVER,N/V.HX:MET CERVICAL CA,BILIARY STENTS.     Action/Plan:   FROM HOME.HAS PCP,PHARMACY.   Anticipated DC Date:  06/09/2013   Anticipated DC Plan:  [place]  CM consult      Choice offered to / List presented to:  C-1 Patient   DME arranged  [place]      DME agency  [place] arranged  HH-1 RN      [place] agency  [facility]   Status of service:  Completed, signed off Medicare Important Message given?   (If response is "NO", the following Medicare IM given date fields will be blank) Date Medicare IM given:   Date Additional Medicare IM given:    Discharge Disposition:  [place]  Per UR Regulation:  Reviewed for med. necessity/level of care/duration of stay  If discussed at [place] of Stay Meetings, dates discussed:   06/07/2013    Comments:  06/08/13 [name] Darsey RN,BSN NCM [place].PATIENT CHOSE [facility]#[phone],SPOKE TO [name] WHO CAN ACCEPT PATIENT.FAXED W/CONFIRMATION TO FAX#[phone]-FACE SHEET,H&P,PALLIATIVE NOTE)[facility] DME REP [name]-INFORMED OF DME-HOSPITAL BED,OVERLAY GEL MATTRESS,W/C.PATIENT ALREADY HAS RW,3N1.NOT ON 02.CONTACT PERSON IS THE PATIENT C#[phone],HER FRIEND [name] WILL ASSIST W/DELIVERY OF EQUIPMENT TO HOME.[address] [place], [place] [address].WILL TRANSPORT HOME TOMORROW BY AMBULANCE.CONTACT [facility] TOMORROW TO CONFIRM D/C HOME-TEL#[phone].[name].Marland KitchenPROVIDE MOST FORM(OUT OF FACILITY DNR).  06/06/13 [name] [name] RN,BNS NCM [phone] PALLIATIVE  CONS.AWAIT RECOMMENDATIONS.  06/04/13 [name] [name] RN,BSN NCM [phone] IR-FOR PORT REMOVAL.?CHOLANGITIS VS PYELONEPHRITIS VS MALIGNANCY.UTI.HGB-8.1,ALB 1.5,BILITOT 2.3.NA 129.AFIB.1UPRBC,IV ABX,IVF@100 .ONC/RD FOLLOWING.MAY NEED HOSPICE.CURRENT D/C PLAN HOME.

## 2013-06-05 DIAGNOSIS — R141 Gas pain: Secondary | ICD-10-CM

## 2013-06-05 DIAGNOSIS — R142 Eructation: Secondary | ICD-10-CM

## 2013-06-05 DIAGNOSIS — C787 Secondary malignant neoplasm of liver and intrahepatic bile duct: Secondary | ICD-10-CM

## 2013-06-05 DIAGNOSIS — E46 Unspecified protein-calorie malnutrition: Secondary | ICD-10-CM

## 2013-06-05 DIAGNOSIS — Z86718 Personal history of other venous thrombosis and embolism: Secondary | ICD-10-CM

## 2013-06-05 DIAGNOSIS — R143 Flatulence: Secondary | ICD-10-CM

## 2013-06-05 DIAGNOSIS — D689 Coagulation defect, unspecified: Secondary | ICD-10-CM

## 2013-06-05 LAB — COMPREHENSIVE METABOLIC PANEL
ALT: 12 U/L (ref 0–35)
AST: 15 U/L (ref 0–37)
Albumin: 1.5 g/dL — ABNORMAL LOW (ref 3.5–5.2)
Alkaline Phosphatase: 642 U/L — ABNORMAL HIGH (ref 39–117)
BILIRUBIN TOTAL: 2.3 mg/dL — AB (ref 0.3–1.2)
BUN: 6 mg/dL (ref 6–23)
CHLORIDE: 95 meq/L — AB (ref 96–112)
CO2: 23 meq/L (ref 19–32)
CREATININE: 0.63 mg/dL (ref 0.50–1.10)
Calcium: 9.8 mg/dL (ref 8.4–10.5)
GFR calc Af Amer: >90 mL/min (ref >90)
GLUCOSE: 86 mg/dL (ref 70–99)
Potassium: 4.5 mEq/L (ref 3.7–5.3)
Sodium: 127 mEq/L — ABNORMAL LOW (ref 137–147)
Total Protein: 5.7 g/dL — ABNORMAL LOW (ref 6.0–8.3)

## 2013-06-05 LAB — PROTIME-INR
INR: 5.76 (ref 0.00–1.49)
PROTHROMBIN TIME: 49.2 s — AB (ref 11.6–15.2)

## 2013-06-05 LAB — CBC
HEMATOCRIT: 29.6 % — AB (ref 36.0–46.0)
Hemoglobin: 9.8 g/dL — ABNORMAL LOW (ref 12.0–15.0)
MCH: 27.7 pg (ref 26.0–34.0)
MCHC: 33.1 g/dL (ref 30.0–36.0)
MCV: 83.6 fL (ref 78.0–100.0)
Platelets: 299 10*3/uL (ref 150–400)
RBC: 3.54 MIL/uL — AB (ref 3.87–5.11)
RDW: 15.8 % — ABNORMAL HIGH (ref 11.5–15.5)
WBC: 8.9 10*3/uL (ref 4.0–10.5)

## 2013-06-05 MED ORDER — SALSALATE 500 MG PO TABS
500.0000 mg | ORAL_TABLET | Freq: Two times a day (BID) | ORAL | Status: DC
  Filled 2013-06-05 (×2): qty 1

## 2013-06-05 MED ORDER — PHYTONADIONE 5 MG PO TABS
10.0000 mg | ORAL_TABLET | Freq: Once | ORAL | Status: AC
  Administered 2013-06-05: 10 mg via ORAL
  Filled 2013-06-05: qty 2

## 2013-06-05 MED ORDER — SODIUM CHLORIDE 1 G PO TABS
1.0000 g | ORAL_TABLET | Freq: Two times a day (BID) | ORAL | Status: DC
  Administered 2013-06-05 – 2013-06-11 (×12): 1 g via ORAL
  Filled 2013-06-05 (×14): qty 1

## 2013-06-05 NOTE — Progress Notes (Signed)
PT Cancellation Note  Patient Details Name: [name] MRN: [id number] DOB: 06/24/1950   Cancelled Treatment:    Reason Eval/Treat Not Completed: Medical issues which prohibited therapy--increased INR 5.79. Will hold PT today and check back another day.    Weston Anna, MPT Pager: [phone]

## 2013-06-05 NOTE — Progress Notes (Signed)
Pt critical INR 5.79 called to MD by night shift RN, J. Asaro.  DR Verlon Au returned call. He is aware of lab and will address when he rounds to see pt. No new orders.

## 2013-06-05 NOTE — Progress Notes (Signed)
[name]  [phone]) [place] NOTE I have seen the patient, examined her and edited the notes as follows: Events since 06/04/13 noted. Denies nausea, vomiting, diarrhea or constipation. She did complained of abdominal bloating. No respiratory or cardiac complaints. No confusion. Pain controlled with meds.  The patient denies any recent signs or symptoms of bleeding such as spontaneous epistaxis, hematuria or hematochezia.  MEDICATIONS:  Scheduled Meds: . ceFEPime (MAXIPIME) IV  1 g Intravenous 3 times per day  . diltiazem  180 mg Oral Daily  . docusate sodium  100 mg Oral BID  . fentaNYL  200 mcg Transdermal Q72H  . ferrous fumarate  1 tablet Oral Daily  . folic acid  1 mg Oral q morning - 10a  . loratadine  10 mg Oral Daily  . metoprolol  12.5 mg Oral BID  . metronidazole  500 mg Intravenous Q8H  . micafungin (MYCAMINE) IV  100 mg Intravenous Daily  . pantoprazole  40 mg Oral Daily  . rivaroxaban  20 mg Oral Q supper  . simethicone  80 mg Oral QID   Continuous Infusions: . sodium chloride    . 0.9 % NaCl with KCl 40 mEq / L 100 mL/hr at 06/05/13 0634   PRN Meds:.acetaminophen, acetaminophen, albuterol, alum & mag hydroxide-simeth, bisacodyl, fluticasone, HYDROmorphone (DILAUDID) injection, HYDROmorphone, lidocaine-prilocaine, LORazepam, ondansetron (ZOFRAN) IV, ondansetron, sodium chloride, zolpidem ALLERGIES:   Allergies  Allergen Reactions  . Codeine Other (See Comments)    Breathing issues  . Flecainide Acetate Other (See Comments)    Leg cramping   . Morphine Other (See Comments)     Hallucinations  . Propafenone Hcl Other (See Comments)    Leg cramps     PHYSICAL EXAMINATION:   Filed Vitals:   06/05/13 0556  BP: 108/75  Pulse: 92  Temp: 97.5 F (36.4 C)  Resp: 18     Filed Weights   06/03/13 0616 06/04/13 0545 06/05/13 0556  Weight: 161 lb 13.1 oz (73.4 kg) 157 lb 10.1 oz (71.5 kg) 156 lb 9.6 oz (71.033 kg)      63 year old white in no acute distress, conversant, alert and oriented to time, place and date.  General well-developed and well-nourished  EYES: Pale conjunctiva, no appreciable scleral icterus.  ENT. Oral cavity without thrush or lesions. Poor dentition.  Neck supple. no thyromegaly  Lymphatics: no cervical or supraclavicular adenopathy  Lungs clear bilaterally . No wheezing, rhonchi or rales. Right port non tender.  Cardiac: irregularly irregular rate and rhythm,no murmur , rubs or gallops  Abdomen soft but distended, mildly tender at the right upper quadrant, bowel sounds x4. No hepatosplenomegaly  Extremities no clubbing cyanosis,trace edema left lower extremity .  SKIN: No petechial rash.  Neuro: No focal or motor deficits.  LABORATORY/RADIOLOGY DATA:   Recent Labs Lab 06/02/13 1645 06/03/13 0354 06/04/13 0445 06/04/13 0907 06/05/13 0454  WBC 10.0 7.4 6.7 5.2 8.9  HGB 10.4* 8.9* 8.4* 8.1* 9.8*  HCT 32.3* 26.7* 25.6* 24.7* 29.6*  PLT 369 287 276 249 299  MCV 84.8 84.2 84.8 85.2 83.6  MCH 27.3 28.1 27.8 27.9 27.7  MCHC 32.2 33.3 32.8 32.8 33.1  RDW 15.3 15.4 15.7* 15.5 15.8*  LYMPHSABS 0.9  --   --   --   --   MONOABS 0.9  --   --   --   --   EOSABS 0.0  --   --   --   --  BASOSABS 0.0  --   --   --   --     CMP    Recent Labs Lab 06/02/13 1645 06/03/13 0354 06/04/13 0445 06/05/13 0454  NA 126* 128* 129* 127*  K 3.4* 4.0 4.7 4.5  CL 90* 95* 97 95*  CO2 23 23 22 23   GLUCOSE 108* 86 81 86  BUN 11 8 7 6   CREATININE 0.65 0.59 0.68 0.63  CALCIUM 10.8* 9.5 9.5 9.8  AST 28 28 19 15   ALT 16 16 15 12   ALKPHOS 918* 874* 734* 642*  BILITOT 2.6* 2.4* 2.3* 2.3*        Component Value Date/Time   BILITOT 2.3* 06/05/2013 0454   BILITOT 2.11* 05/21/2013 1115   BILIDIR 1.9* 05/09/2013 0515   IBILI 1.0* 05/09/2013 0515    Anemia panel:    Recent Labs  06/04/13 1210 06/04/13 1354  VITAMINB12  --  1508*  FOLATE  --  14.5  FERRITIN  --  531*  TIBC  --   Not calculated due to Iron <10.  IRON  --  <10*  RETICCTPCT 2.0  --      Recent Labs Lab 06/04/13 0907 06/05/13 0454  INR 1.80* 5.76*      Liver Function Tests:  Recent Labs Lab 06/02/13 1645 06/03/13 0354 06/04/13 0445 06/05/13 0454  AST 28 28 19 15   ALT 16 16 15 12   ALKPHOS 918* 874* 734* 642*  BILITOT 2.6* 2.4* 2.3* 2.3*  PROT 7.5 6.0 5.8* 5.7*  ALBUMIN 2.0* 1.6* 1.5* 1.5*   ASSESSMENT AND PLAN:   1.Metastatic cervical cancer  The patient has recently progressed on multiple chemotherapy agents. Patient is scheduled to receive Topotecan on 4/16. Her Port is still in place. It was originally planned to be removed on admission after infection was suspected, but cultures to date are negative. Her performance status is very poor. She may not be a candidate for systemic chemotherapy in the future. The patient understood she may not be able to improve to the point to receive treatment but at this point in time she wants to receive aggressive care with consideration for more future treatment.  2. Sepsis, likely intra-abdominal source  Cultures to date negative. Off Zosyn IV, now on Cefepime plus IV metronidazole plus Micafungin, Day 2. Infectious disease team is following.  Hold off removal of port for now due to coagulopathy. Once coagulopathy resolves, if infectious disease service still felt port needs to be removed, I recommend we proceed.   3. 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 Continue monitoring  4. Nausea and vomiting, abdominal bloating Suspect this is due to peritoneal carcinomatosis or infection. Her symptoms has improved with no further vomiting. Continue supportive care with IV fluids   5. History of recurrent DVT, s/p IVC filter, currently coagulopathy   INR supratherapeutic today. Cause could be due to liver disease, heparin, Xarelto and malnutrition. Recommend vitamin K supplement, recheck INR tomorrow.  6.  Protein Calorie Malnutrition Continue nutritional supplement  7. Anemia  This is likely anemia of chronic disease. The patient denies recent history of bleeding such as epistaxis, hematuria or hematochezia. She is asymptomatic from the anemia. Hb today is 9.8  We will observe for now. She does not require transfusion now.   8. Hyponatremia,  Likely due to dehydration. Recommend gentle IV fluid hydration as per primary service.   9. Full code:  Recommend palliative care consult to be seeing this patient to  address goal of care.   Rondel Jumbo, PA-C 06/05/2013, 7:14 AM Natori Gudino, MD  06/05/2013

## 2013-06-05 NOTE — Progress Notes (Signed)
Note: This document was prepared with digital dictation and possible smart phrase technology. Any transcriptional errors that result from this process are unintentional.   [name] OFB:[id number] DOB: 1950/07/26 DOA: 06/02/2013 PCP: Gwendolyn Grant, MD  Brief narrative:  63 y/o ? 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 Htn recent Hospital stay 05/04/13-->05/15/13 Asc.Cholangitis with Serratia and C [place] bacteremia completing Cipro/Fluconazole 3/24 was  re-admitted to [facility] c Potential UTI, presyncopal episode along with nausea and vomiting prompting thought of either Pyelonephritis/obstructive jaundice/advanced cervical cancer with metastases as CT scan of abdomen 05/23/13 = interval development of omental and notes concerning for new metastatic involvement with slight progression of hepatoduodenal ligament and retroperitoneum lymphadenopathy On admission had low blood pressures 90s over 60s ,Sodium 128, BUN/creatinine 8/0.5, alkaline phosphatase 874, albumin 1.6, hemoglobin 9, HCT 26, alkaline phosphatase 874 , total bilirubin 2.4, lactic acid 1.8, point-of-care troponin 0.3 Given consideration for urinary tract infection empirically Rocephin and vancomycin Patient states that she has not felt strong and does not think that she recovered from the ascending cholangitis from last time overall just feels weak and is drinking more than she is eating and eating very little overall. Drains draining fairly well left greater than right without decreased output.  She has chronic left lower extremity lymphedema   Past medical history-As per Problem list Chart reviewed as below-  Consultants:   interventional radiology  Oncology   Procedures: acute abdominal series    acute abdominal series 06/02/13  Chest x-ray  06/02/48   Antibiotics:   Rocephin 06/02/13 -06/03/13   Zosyn 06/03/13   Vancomycin 06/02/13-06/03/13   Micafungin 06/04/13   Subjective    Doing fair-somewhat emotional about discussions No fever or chills concerned about IVF as known h/o HF Not eating much at all Abd pain was worse last night but controlled on home regimen now    Objective    Interim History:  reviewed  Telemetry:  atrial fibrillation rates in 150 range   Objective: Filed Vitals:   06/05/13 0848 06/05/13 0853 06/05/13 0854 06/05/13 1103  BP: 115/81 122/79 120/81 117/82  Pulse: 90 99 104 114  Temp:      TempSrc:      Resp:    18  Height:      Weight:      SpO2:    99%    Intake/Output Summary (Last 24 hours) at 06/05/13 1402 Last data filed at 06/05/13 1330  Gross per 24 hour  Intake 3312.5 ml  Output    742 ml  Net 2570.5 ml    Exam:  General:  alert, tired-appearing pleasant Caucasian female  Cardiovascular:  Tachycardic, S1-S2 atrial fibrillation noted Respiratory: Clinically clear no added sounds Abdomen: Nondistended however multiple drains in place, right upper quadrant discomfort on palpation Skin no lower extremity edema but some lymphedema of the left lower extremity which is chronic Neuro intact  Data Reviewed: Basic Metabolic Panel:  Recent Labs Lab 06/02/13 1645 06/03/13 0354 06/04/13 0445 06/05/13 0454  NA 126* 128* 129* 127*  K 3.4* 4.0 4.7 4.5  CL 90* 95* 97 95*  CO2 $Re'23 23 22 23  'yRJ$ GLUCOSE 108* 86 81 86  BUN $Re'11 8 7 6  'FKb$ CREATININE 0.65 0.59 0.68 0.63  CALCIUM 10.8* 9.5 9.5 9.8  Liver Function Tests:  Recent Labs Lab 06/02/13 1645 06/03/13 0354 06/04/13 0445 06/05/13 0454  AST $Re'28 28 19 15  'brk$ ALT $R'16 16 15 12  'ke$ ALKPHOS 918* 874* 734* 642*  BILITOT 2.6* 2.4* 2.3* 2.3*  PROT 7.5 6.0 5.8* 5.7*  ALBUMIN 2.0* 1.6* 1.5* 1.5*    Recent Labs Lab 06/02/13 1645  LIPASE 8*   No results found for this basename: AMMONIA,  in the last 168 hours CBC:  Recent Labs Lab  06/02/13 1645 06/03/13 0354 06/04/13 0445 06/04/13 0907 06/05/13 0454  WBC 10.0 7.4 6.7 5.2 8.9  NEUTROABS 8.2*  --   --   --   --   HGB 10.4* 8.9* 8.4* 8.1* 9.8*  HCT 32.3* 26.7* 25.6* 24.7* 29.6*  MCV 84.8 84.2 84.8 85.2 83.6  PLT 369 287 276 249 299   Cardiac Enzymes:  Recent Labs Lab 06/02/13 1645  TROPONINI <0.30   BNP: No components found with this basename: POCBNP,  CBG: No results found for this basename: GLUCAP,  in the last 168 hours  Recent Results (from the past 240 hour(s))  CULTURE, BLOOD (SINGLE)     Status: None   Collection Time    05/28/13 11:30 AM      Result Value Ref Range Status   Organism ID, Bacteria NO GROWTH 5 DAYS   Final  CULTURE, BLOOD (SINGLE)     Status: None   Collection Time    05/28/13 12:00 PM      Result Value Ref Range Status   Organism ID, Bacteria NO GROWTH 5 DAYS   Final  URINE CULTURE     Status: None   Collection Time    06/02/13  5:11 PM      Result Value Ref Range Status   Specimen Description URINE, CATHETERIZED   Final   Special Requests NONE   Final   Culture  Setup Time     Final   Value: 06/02/2013 21:57     Performed at [facility] Count     Final   Value: NO GROWTH     Performed at [facility]   Culture     Final   Value: NO GROWTH     Performed at [facility]   Report Status 06/03/2013 FINAL   Final  CULTURE, BLOOD (ROUTINE X 2)     Status: None   Collection Time    06/03/13 10:55 AM      Result Value Ref Range Status   Specimen Description BLOOD LEFT ARM  1 ML IN AEROBIC ONLY   Final   Special Requests Immunocompromised   Final   Culture  Setup Time     Final   Value: 06/03/2013 18:35     Performed at [facility]   Culture     Final   Value:        BLOOD CULTURE RECEIVED NO GROWTH TO DATE CULTURE WILL BE HELD FOR 5 DAYS BEFORE ISSUING A FINAL NEGATIVE REPORT     Performed at [facility]   Report Status PENDING   Incomplete  CULTURE, BLOOD (ROUTINE  X 2)     Status: None   Collection Time    06/03/13 11:19 AM      Result Value Ref Range Status   Specimen Description BLOOD LEFT ARM  2 ML IN [facility] BOTTLE   Final   Special Requests Immunocompromised   Final   Culture  Setup Time  Final   Value: 06/03/2013 18:35     Performed at [facility]   Culture     Final   Value:        BLOOD CULTURE RECEIVED NO GROWTH TO DATE CULTURE WILL BE HELD FOR 5 DAYS BEFORE ISSUING A FINAL NEGATIVE REPORT     Performed at [facility]   Report Status PENDING   Incomplete     Studies:              All Imaging reviewed and is as per above notation   Scheduled Meds: . ceFEPime (MAXIPIME) IV  1 g Intravenous 3 times per day  . diltiazem  180 mg Oral Daily  . docusate sodium  100 mg Oral BID  . fentaNYL  200 mcg Transdermal Q72H  . ferrous fumarate  1 tablet Oral Daily  . folic acid  1 mg Oral q morning - 10a  . loratadine  10 mg Oral Daily  . metoprolol  12.5 mg Oral BID  . metronidazole  500 mg Intravenous Q8H  . micafungin (MYCAMINE) IV  100 mg Intravenous Daily  . pantoprazole  40 mg Oral Daily  . rivaroxaban  20 mg Oral Q supper  . simethicone  80 mg Oral QID   Continuous Infusions:     Assessment/Plan: 1. Nausea vomiting-likely secondary to #2. 2. Potential cholangitis vs. Pyelonephritis vs. Malignancy related fever-elevated alkaline phosphatase leading me to think maybe she has an obstructive component, no dysuria, chronic right abdominal pain leads me to think small cholangitis and pyelonephritis . change IV antibiotics to Zosyn. Discussed with ID physician 06/04/13 who recommends starting Micafungin.  Blood cultures x2 negative,  urine culture negative- The more pressing concern is that she has a port that was suspected to be potential nidus for infection which may need to be removed and was scheduled to be removed on 06/04/13-defer decision to Oncology later today regarding plans and [place] 3. Anemia of malignancy-hemoglobin has  dropped from baseline of 10-8.transfused 1 u prbc. Anemia panel confirms AOCD/AoMalignancy 4. Presyncopal episode-up late as result of poor by mouth intake with excess fluid >diet. Will order her diet and see how she feels. Her beta blockers has been cut back slightly. She is also on fentanyl 200 mcg daily which may contribute to mild hypotension.  Transfusion seemsto have helped 5. Euvolemic hyponatremia with possible tea/toast potomania the goal-secondary to not eating-increase solid diet, salt tablets.  Baseline sodium is 134. 6. Atrial fibrillation with rapid ventricular response-rate in the 150s. On admission.  Better with IVf and Transfusion 06/04/13.  continue Cardizem 180 mg daily, metoprolol given a lower dose 12.5 twice a day-restart Xarelto-d/c IVF 06/05/13 7. Metastatic cervical cancer-supposed to start new Chemo regimen, Topotecan-Appreciate Oncology input in advance-CT scan from 3/25 showed spread to the omentum and slight progression of hepatoduodenal ligament lymphadenopathy--deferred restaging workup to oncology and their input regarding care I appreciate this in advance 8. Hypokalemia potassium has been replaced  9. Adult failure to thrive-as above 10. Prior DVTs left lower extremity 10/2010, 04/2011 status post IVC filter-Restarted Xarelto 06/04/48  Code Status: Full  Family Communication: discussed with patient at bedside Disposition Plan: Inpatient pending oncology input regarding further goals of care   Verneita Griffes, MD  [facility] Pager [phone] 06/05/2013, 2:02 PM    LOS: 3 days

## 2013-06-05 NOTE — Progress Notes (Signed)
Pt had an episode of emesis after eating some of lunch. Vomited undigested food into basin. States relief after vomiting but agreeable to zofran IV.  Pt medicated and freshened up. Will monitor.

## 2013-06-05 NOTE — Progress Notes (Signed)
CRITICAL VALUE ALERT  Critical value received:  INR 5.76  Date of notification:  06/05/2013  Time of notification:  0630  Critical value read back:yes  Nurse who received alert:  Theora Gianotti, RN  MD notified (1st page):  Nada Libman, MD  Time of first page:  0708  MD notified (2nd page): n/a  Time of second page: n/a  Responding MD:  n/a  Time MD responded: n/a    MD will give further orders to dayshift nurse, Elzie Rings, RN.

## 2013-06-06 ENCOUNTER — Other Ambulatory Visit: Payer: Self-pay | Admitting: Hematology and Oncology

## 2013-06-06 DIAGNOSIS — R109 Unspecified abdominal pain: Secondary | ICD-10-CM

## 2013-06-06 DIAGNOSIS — K8309 Other cholangitis: Secondary | ICD-10-CM

## 2013-06-06 DIAGNOSIS — D638 Anemia in other chronic diseases classified elsewhere: Secondary | ICD-10-CM

## 2013-06-06 LAB — COMPREHENSIVE METABOLIC PANEL
ALK PHOS: 621 U/L — AB (ref 39–117)
ALT: 9 U/L (ref 0–35)
AST: 14 U/L (ref 0–37)
Albumin: 1.5 g/dL — ABNORMAL LOW (ref 3.5–5.2)
BILIRUBIN TOTAL: 1.9 mg/dL — AB (ref 0.3–1.2)
BUN: 5 mg/dL — ABNORMAL LOW (ref 6–23)
CO2: 24 mEq/L (ref 19–32)
Calcium: 9.7 mg/dL (ref 8.4–10.5)
Chloride: 93 mEq/L — ABNORMAL LOW (ref 96–112)
Creatinine, Ser: 0.59 mg/dL (ref 0.50–1.10)
GFR calc Af Amer: >90 mL/min (ref >90)
Glucose, Bld: 110 mg/dL — ABNORMAL HIGH (ref 70–99)
Potassium: 4.4 mEq/L (ref 3.7–5.3)
Sodium: 127 mEq/L — ABNORMAL LOW (ref 137–147)
Total Protein: 5.6 g/dL — ABNORMAL LOW (ref 6.0–8.3)

## 2013-06-06 LAB — PROTIME-INR
INR: 2.22 — ABNORMAL HIGH (ref 0.00–1.49)
Prothrombin Time: 23.9 seconds — ABNORMAL HIGH (ref 11.6–15.2)

## 2013-06-06 LAB — CBC
HEMATOCRIT: 30.9 % — AB (ref 36.0–46.0)
Hemoglobin: 10.4 g/dL — ABNORMAL LOW (ref 12.0–15.0)
MCH: 28 pg (ref 26.0–34.0)
MCHC: 33.7 g/dL (ref 30.0–36.0)
MCV: 83.1 fL (ref 78.0–100.0)
PLATELETS: 309 10*3/uL (ref 150–400)
RBC: 3.72 MIL/uL — ABNORMAL LOW (ref 3.87–5.11)
RDW: 16.2 % — AB (ref 11.5–15.5)
WBC: 9.2 10*3/uL (ref 4.0–10.5)

## 2013-06-06 MED ORDER — FLUCONAZOLE 200 MG PO TABS
400.0000 mg | ORAL_TABLET | Freq: Every day | ORAL | Status: DC
  Administered 2013-06-06 – 2013-06-11 (×6): 400 mg via ORAL
  Filled 2013-06-06 (×6): qty 2

## 2013-06-06 MED ORDER — METOPROLOL TARTRATE 25 MG PO TABS
25.0000 mg | ORAL_TABLET | Freq: Two times a day (BID) | ORAL | Status: DC
  Administered 2013-06-06 – 2013-06-11 (×10): 25 mg via ORAL
  Filled 2013-06-06 (×11): qty 1

## 2013-06-06 MED ORDER — PHYTONADIONE 5 MG PO TABS
10.0000 mg | ORAL_TABLET | Freq: Once | ORAL | Status: AC
  Administered 2013-06-06: 10 mg via ORAL
  Filled 2013-06-06: qty 2

## 2013-06-06 MED ORDER — LORAZEPAM 1 MG PO TABS
1.0000 mg | ORAL_TABLET | ORAL | Status: DC | PRN
  Administered 2013-06-06 – 2013-06-08 (×2): 1 mg via ORAL
  Filled 2013-06-06 (×2): qty 1

## 2013-06-06 MED ORDER — HYDROMORPHONE HCL PF 1 MG/ML IJ SOLN
1.0000 mg | INTRAMUSCULAR | Status: DC | PRN
  Administered 2013-06-06 – 2013-06-09 (×17): 1 mg via INTRAVENOUS
  Filled 2013-06-06 (×17): qty 1

## 2013-06-06 NOTE — Progress Notes (Addendum)
[name] KGY:[id number] DOB: 01-Aug-1950 DOA: 06/02/2013 PCP: Gwendolyn Grant, MD  Brief narrative:  63 y/o ? 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 Htn recent Hospital stay 05/04/13-->05/15/13 Asc.Cholangitis with Serratia and C [place] bacteremia completing Cipro/Fluconazole 3/24 was  re-admitted to [facility] c Potential UTI, presyncopal episode along with nausea and vomiting prompting thought of either Pyelonephritis/obstructive jaundice/advanced cervical cancer with metastases as CT scan of abdomen 05/23/13 = interval development of omental and notes concerning for new metastatic involvement with slight progression of hepatoduodenal ligament and retroperitoneum lymphadenopathy On admission had low blood pressures 90s over 60s ,Sodium 128, BUN/creatinine 8/0.5, alkaline phosphatase 874, albumin 1.6, hemoglobin 9, HCT 26, alkaline phosphatase 874 , total bilirubin 2.4, lactic acid 1.8, point-of-care troponin 0.3 Given consideration for urinary tract infection empirically Rocephin and vancomycin   Past medical history-As per Problem list Chart reviewed as below-  Consultants:   interventional radiology  Oncology   Procedures: acute abdominal series    acute abdominal series 06/02/13  Chest x-ray 06/02/48   Antibiotics:   Rocephin 06/02/13 -06/03/13   Zosyn 06/03/13   Vancomycin 06/02/13-06/03/13   Micafungin 06/04/13   Subjective   Still having severe R sided abd pain, threw up last pm    Objective    Interim History:  reviewed  Telemetry:  atrial fibrillation rates in 150 range   Objective: Filed Vitals:   06/06/13 0551 06/06/13 1035 06/06/13 1138 06/06/13 1336  BP: 127/83 138/86 129/97 135/88  Pulse: 110 118  93  Temp: 98.5 F (36.9 C)   97.9 F (36.6 C)  TempSrc: Oral   Oral  Resp: 17    16  Height:      Weight: 70.1 kg (154 lb 8.7 oz)     SpO2: 96%   98%    Intake/Output Summary (Last 24 hours) at 06/06/13 1433 Last data filed at 06/06/13 0900  Gross per 24 hour  Intake    600 ml  Output    400 ml  Net    200 ml    Exam:  General:  alert, tired-appearing pleasant Caucasian female  Cardiovascular:  Tachycardic, S1-S2 atrial fibrillation noted Respiratory: Clinically clear no added sounds Abdomen: Nondistended however multiple drains in place, right upper quadrant mild tenderness on palpation, Diminished BS Skin no lower extremity edema but some lymphedema of the left lower extremity which is chronic Neuro intact  Data Reviewed: Basic Metabolic Panel:  Recent Labs Lab 06/02/13 1645 06/03/13 0354 06/04/13 0445 06/05/13 0454 06/06/13 0338  NA 126* 128* 129* 127* 127*  K 3.4* 4.0 4.7 4.5 4.4  CL 90* 95* 97 95* 93*  CO2 _0 GLUCOSE 108* 86 81 86 110*  BUN _1 5*  CREATININE 0.65 0.59 0.68 0.63 0.59  CALCIUM 10.8* 9.5 9.5 9.8 9.7   Liver Function Tests:  Recent Labs Lab 06/02/13 1645 06/03/13 0354 06/04/13 0445 06/05/13 0454 06/06/13 0338  AST _2 ALT _3 ALKPHOS 918* 874* 734* 642* 621*  BILITOT 2.6* 2.4* 2.3* 2.3* 1.9*  PROT 7.5 6.0 5.8* 5.7* 5.6*  ALBUMIN 2.0* 1.6* 1.5* 1.5* 1.5*    Recent Labs Lab 06/02/13 1645  LIPASE 8*   No results found  for this basename: AMMONIA,  in the last 168 hours CBC:  Recent Labs Lab 06/02/13 1645 06/03/13 0354 06/04/13 0445 06/04/13 0907 06/05/13 0454 06/06/13 0338  WBC 10.0 7.4 6.7 5.2 8.9 9.2  NEUTROABS 8.2*  --   --   --   --   --   HGB 10.4* 8.9* 8.4* 8.1* 9.8* 10.4*  HCT 32.3* 26.7* 25.6* 24.7* 29.6* 30.9*  MCV 84.8 84.2 84.8 85.2 83.6 83.1  PLT 369 287 276 249 299 309   Cardiac Enzymes:  Recent Labs Lab 06/02/13 1645  TROPONINI <0.30   BNP: No components found with this basename: POCBNP,  CBG: No results found for this basename: GLUCAP,   in the last 168 hours  Recent Results (from the past 240 hour(s))  CULTURE, BLOOD (SINGLE)     Status: None   Collection Time    05/28/13 11:30 AM      Result Value Ref Range Status   Organism ID, Bacteria NO GROWTH 5 DAYS   Final  CULTURE, BLOOD (SINGLE)     Status: None   Collection Time    05/28/13 12:00 PM      Result Value Ref Range Status   Organism ID, Bacteria NO GROWTH 5 DAYS   Final  URINE CULTURE     Status: None   Collection Time    06/02/13  5:11 PM      Result Value Ref Range Status   Specimen Description URINE, CATHETERIZED   Final   Special Requests NONE   Final   Culture  Setup Time     Final   Value: 06/02/2013 21:57     Performed at [place]     Final   Value: NO GROWTH     Performed at [facility]   Culture     Final   Value: NO GROWTH     Performed at [facility]   Report Status 06/03/2013 FINAL   Final  CULTURE, BLOOD (ROUTINE X 2)     Status: None   Collection Time    06/03/13 10:55 AM      Result Value Ref Range Status   Specimen Description BLOOD LEFT ARM  1 ML IN AEROBIC ONLY   Final   Special Requests Immunocompromised   Final   Culture  Setup Time     Final   Value: 06/03/2013 18:35     Performed at [facility]   Culture     Final   Value:        BLOOD CULTURE RECEIVED NO GROWTH TO DATE CULTURE WILL BE HELD FOR 5 DAYS BEFORE ISSUING A FINAL NEGATIVE REPORT     Performed at [facility]   Report Status PENDING   Incomplete  CULTURE, BLOOD (ROUTINE X 2)     Status: None   Collection Time    06/03/13 11:19 AM      Result Value Ref Range Status   Specimen Description BLOOD LEFT ARM  2 ML IN [facility] BOTTLE   Final   Special Requests Immunocompromised   Final   Culture  Setup Time     Final   Value: 06/03/2013 18:35     Performed at [facility]   Culture     Final   Value:        BLOOD CULTURE RECEIVED NO GROWTH TO DATE CULTURE WILL BE HELD FOR 5 DAYS BEFORE ISSUING A FINAL  NEGATIVE REPORT  Performed at [facility]   Report Status PENDING   Incomplete     Studies:              All Imaging reviewed and is as per above notation   Scheduled Meds: . ceFEPime (MAXIPIME) IV  1 g Intravenous 3 times per day  . diltiazem  180 mg Oral Daily  . docusate sodium  100 mg Oral BID  . fentaNYL  200 mcg Transdermal Q72H  . folic acid  1 mg Oral q morning - 10a  . loratadine  10 mg Oral Daily  . metoprolol  12.5 mg Oral BID  . metronidazole  500 mg Intravenous Q8H  . micafungin (MYCAMINE) IV  100 mg Intravenous Daily  . pantoprazole  40 mg Oral Daily  . phytonadione  10 mg Oral Once  . simethicone  80 mg Oral QID  . sodium chloride  1 g Oral BID WC   Continuous Infusions:    Assessment/Plan: 1. Fever/Sepsis    - Potential cholangitis vs. Malignancy related fever vs another abd source for fever    - LFTS/ALP trending down    -check [place] Abd    -biliary drains working reasonably well    -continue Cefepime, Dr.Samtani discussed with ID physician 06/04/13 who recommends starting Micafungin.  Blood cultures x2 negative,  urine culture negative- given negative Blood cultures will hold off on Port removal    -conservative mgt since likely headed towards comfort oriented care  2. Anemia of malignancy     - transfused 1 u prbc this admission. Anemia panel confirms AOCD/AoMalignancy  3. Presyncopal episode       -due to 1/2   4. Euvolemic hyponatremia with possible tea/toast potomania the goal-secondary to not eating-increase solid diet, salt tablets.  Baseline sodium is 134.  5. Atrial fibrillation with rapid ventricular response-       -HR still fluctuating, continue metoprolol/diltiazem, watch closely for bradycardia, HR dropped to 40s 2 days ago       -may have a component of tachybrady syndrome but not candidate for Pacer  6. Metastatic cervical cancer-was supposed to start new Chemo regimen, Topotecan-CT scan from 3/25 showed spread to the omentum and  slight progression of hepatoduodenal ligament lymphadenopathy, oncology following, poor prognosis, Palliative consulted for goals of Care           -Pain of malignancy, continue fentanyl patch, dilaudid PO and IV PRN  7. Hypokalemia potassium has been replaced   8. Adult failure to thrive-as above  9. 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

## 2013-06-06 NOTE — Progress Notes (Signed)
IR PA still reviewing chart notes in case of request for port removal, will need INR < 1.5 for procedure.  Tsosie Billing PA-C Interventional Radiology  06/06/13  9:12 AM

## 2013-06-06 NOTE — Progress Notes (Signed)
Nutrition Note  Per discussion with RN, pt has been requesting [employer]. Will order CIB for 10AM/2PM/8PM nourishments, and informed RN that pt is allowed to order with her meals if specified during the ordering process  Will continue to monitor per protocols. Please re-consult as needed.  Atlee Abide MS RD LDN Clinical Dietitian [phone]

## 2013-06-06 NOTE — Progress Notes (Signed)
PT Cancellation Note  Patient Details Name: [name] MRN: [id number] DOB: [birth year]   Cancelled Treatment:    Reason Eval/Treat Not Completed: spoke with MD who recommended PT be held today. Will continue to check back. Thanks.   Weston Anna, MPT Pager: [phone]

## 2013-06-06 NOTE — Progress Notes (Signed)
[place]  Telephone:([phone]) [place] NOTE I have seen the patient, examined her and edited the notes as follows: Events since 06/05/13 noted. Had one episode of  billous emesis yesterday without recurrence. Her nausea is more frequent. No diarrhea. Last bowel movement on Saturday.  Abdominal bloating present. She complains of left abdominal pain, controlled with meds.  No respiratory or cardiac complaints. No confusion.  The patient denies any recent signs or symptoms of bleeding such as spontaneous epistaxis, hematuria or hematochezia.  MEDICATIONS:  Scheduled Meds: . ceFEPime (MAXIPIME) IV  1 g Intravenous 3 times per day  . diltiazem  180 mg Oral Daily  . docusate sodium  100 mg Oral BID  . fentaNYL  200 mcg Transdermal Q72H  . folic acid  1 mg Oral q morning - 10a  . loratadine  10 mg Oral Daily  . metoprolol  12.5 mg Oral BID  . metronidazole  500 mg Intravenous Q8H  . micafungin (MYCAMINE) IV  100 mg Intravenous Daily  . pantoprazole  40 mg Oral Daily  . simethicone  80 mg Oral QID  . sodium chloride  1 g Oral BID WC   Continuous Infusions:   PRN Meds:.acetaminophen, acetaminophen, albuterol, alum & mag hydroxide-simeth, bisacodyl, fluticasone, HYDROmorphone (DILAUDID) injection, HYDROmorphone, lidocaine-prilocaine, LORazepam, ondansetron (ZOFRAN) IV, ondansetron, sodium chloride, zolpidem ALLERGIES:   Allergies  Allergen Reactions  . Codeine Other (See Comments)    Breathing issues  . Flecainide Acetate Other (See Comments)    Leg cramping   . Morphine Other (See Comments)     Hallucinations  . Propafenone Hcl Other (See Comments)    Leg cramps     PHYSICAL EXAMINATION:   Filed Vitals:   06/06/13 0551  BP: 127/83  Pulse: 110  Temp: 98.5 F (36.9 C)  Resp: 17     Filed Weights   06/04/13 0545 06/05/13 0556 06/06/13 0551  Weight: 157 lb 10.1 oz (71.5 kg) 156 lb 9.6 oz (71.033 kg) 154 lb 8.7 oz (70.20 kg)    63 year old  white in no acute distress, but tearful due to her current status, alert and oriented. Ill appearing EYES: Pale conjunctiva, no appreciable scleral icterus.  ENT. Oral cavity without thrush or lesions. Poor dentition.  Lungs clear bilaterally . No wheezing, rhonchi or rales. Right port non tender.  Cardiac: irregularly irregular rate and rhythm,no murmur , rubs or gallops  Abdomen  Mildly distended, tender in both upper quadrants.  bowel sounds x4. Biliary drains present. Extremities no clubbing cyanosis,trace edema left lower extremity .  SKIN: No petechial rash.  Neuro: No focal or motor deficits.  LABORATORY/RADIOLOGY DATA:   Recent Labs Lab 06/02/13 1645 06/03/13 0354 06/04/13 0445 06/04/13 0907 06/05/13 0454 06/06/13 0338  WBC 10.0 7.4 6.7 5.2 8.9 9.2  HGB 10.4* 8.9* 8.4* 8.1* 9.8* 10.4*  HCT 32.3* 26.7* 25.6* 24.7* 29.6* 30.9*  PLT 369 287 276 249 299 309  MCV 84.8 84.2 84.8 85.2 83.6 83.1  MCH 27.3 28.1 27.8 27.9 27.7 28.0  MCHC 32.2 33.3 32.8 32.8 33.1 33.7  RDW 15.3 15.4 15.7* 15.5 15.8* 16.2*  LYMPHSABS 0.9  --   --   --   --   --   MONOABS 0.9  --   --   --   --   --   EOSABS 0.0  --   --   --   --   --   BASOSABS 0.0  --   --   --   --   --  CMP    Recent Labs Lab 06/02/13 1645 06/03/13 0354 06/04/13 0445 06/05/13 0454 06/06/13 0338  NA 126* 128* 129* 127* 127*  K 3.4* 4.0 4.7 4.5 4.4  CL 90* 95* 97 95* 93*  CO2 23 23 22 23 24   GLUCOSE 108* 86 81 86 110*  BUN 11 8 7 6  5*  CREATININE 0.65 0.59 0.68 0.63 0.59  CALCIUM 10.8* 9.5 9.5 9.8 9.7  AST 28 28 19 15 14   ALT 16 16 15 12 9   ALKPHOS 918* 874* 734* 642* 621*  BILITOT 2.6* 2.4* 2.3* 2.3* 1.9*        Component Value Date/Time   BILITOT 1.9* 06/06/2013 0338   BILITOT 2.11* 05/21/2013 1115   BILIDIR 1.9* 05/09/2013 0515   IBILI 1.0* 05/09/2013 0515    Anemia panel:    Recent Labs  06/04/13 1210 06/04/13 1354  VITAMINB12  --  1508*  FOLATE  --  14.5  FERRITIN  --  531*  TIBC  --   Not calculated due to Iron <10.  IRON  --  <10*  RETICCTPCT 2.0  --      Recent Labs Lab 06/04/13 0907 06/05/13 0454 06/06/13 0338  INR 1.80* 5.76* 2.22*      Liver Function Tests:  Recent Labs Lab 06/02/13 1645 06/03/13 0354 06/04/13 0445 06/05/13 0454 06/06/13 0338  AST 28 28 19 15 14   ALT 16 16 15 12 9   ALKPHOS 918* 874* 734* 642* 621*  BILITOT 2.6* 2.4* 2.3* 2.3* 1.9*  PROT 7.5 6.0 5.8* 5.7* 5.6*  ALBUMIN 2.0* 1.6* 1.5* 1.5* 1.5*   ASSESSMENT AND PLAN:  1.Metastatic cervical cancer  The patient has recently progressed on multiple chemotherapy agents. She is scheduled to receive Topotecan on 4/16. Her Port is still in place. It was originally planned to be removed on admission after infection was suspected, but cultures to date are negative. Her performance status is very poor. She may not be a candidate for systemic chemotherapy in the future. The patient understood she may not be able to improve to the point to receive treatment . Today, she has expressed interest in discussion her status with her family, who she says "they seem to be in a different page of what I want", stating that she may want to discontinue all chemo plans, and to entertain the Home Hospice option instead. I have discussed with the palliative care service and will redirect her focus of care and transition all her medications with plan to discharge her in the near future to home hospice program. I recommend we keep the Infuse-a-Port for now for later use as I do not see a benefit to removing it as it would put her through another unnecessary procedure.  2. Sepsis, likely intra-abdominal source  Cultures to date negative. Off Zosyn IV, now on Cefepime plus IV metronidazole plus Micafungin, Day 2. Infectious disease team is following.  Removal of port was held due to coagulopathy. I recommend transitioning her IV antibiotics to oral medications if she can tolerate by mouth or discontinue them  altogether at the time of dismissal.  3. 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 Continue monitoring  4. Nausea and vomiting, abdominal bloating Suspect this is due to peritoneal carcinomatosis or infection. Her symptoms recurred. Continue supportive care with IV fluids. Consider change antiemetics to around the clock. Bowel support needed for constipation.   5. History of recurrent DVT, s/p IVC filter, currently coagulopathy   INR  2.2 today after Vit K administration on 4/7. Cause could be due to liver disease, heparin, Xarelto and malnutrition. Recheck INR tomorrow. I recommend one more dose of vitamin K today.  6. Protein Calorie Malnutrition Continue nutritional supplement as tolerated  7. Anemia  This is likely anemia of chronic disease. The patient denies recent history of bleeding such as epistaxis, hematuria or hematochezia. She is asymptomatic from the anemia. Hb today is 10.4 We will observe for now. She does not require transfusion now.   8. Hyponatremia,  Likely due to dehydration. Recommend gentle IV fluid hydration as per primary service.   9. palliative care and discharge planning I recommend palliative care consult to see this patient and to transition her medications to outpatient regimen and for home hospice program. I discussed with the patient advanced directives and CODE STATUS. The patient and agreed to change her CODE STATUS to DO NOT RESUSCITATE and DO NOT INTUBATE.  Rondel Jumbo, PA-C 06/06/2013, 7:12 AM Heath Lark, MD 06/06/2013

## 2013-06-06 NOTE — Progress Notes (Addendum)
    [facility] [place] for Infectious Disease    Date of Admission:  06/02/2013   Total days of antibiotics 5        Day 3 cefepime        Day 3 metro        Day 3 fluconazole   ID: [name] is a 63 y.o. female with  Metastatic cervical cancer including liver mets causing obstructive jaundice s/p stent placement recently treated for bacteremia/cholangitis readmitted for fevers. Repeat blood cx ngtd.  Principal Problem:   Nausea and vomiting Active Problems:   Atrial fibrillation   Cervical cancer   Anemia of chronic disease   Alkaline phosphatase elevation   Malignant obstructive jaundice   Fever and chills   Hyponatremia   Syncope and collapse   Leg edema, left   Orthostatic hypotension   UTI (urinary tract infection)    Subjective: Afebrile. Discussed hospice with dr. Alvy Bimler. Sleeping presently   Medications:  . ceFEPime (MAXIPIME) IV  1 g Intravenous 3 times per day  . diltiazem  180 mg Oral Daily  . docusate sodium  100 mg Oral BID  . fentaNYL  200 mcg Transdermal Q72H  . folic acid  1 mg Oral q morning - 10a  . loratadine  10 mg Oral Daily  . metoprolol  25 mg Oral BID  . metronidazole  500 mg Intravenous Q8H  . micafungin (MYCAMINE) IV  100 mg Intravenous Daily  . pantoprazole  40 mg Oral Daily  . simethicone  80 mg Oral QID  . sodium chloride  1 g Oral BID WC    Objective: Vital signs in last 24 hours: Temp:  [97.9 F (36.6 C)-98.5 F (36.9 C)] 97.9 F (36.6 C) (04/08 1336) Pulse Rate:  [93-118] 93 (04/08 1336) Resp:  [16-17] 16 (04/08 1336) BP: (124-138)/(78-97) 135/88 mmHg (04/08 1336) SpO2:  [96 %-98 %] 98 % (04/08 1336) Weight:  [154 lb 8.7 oz (70.1 kg)] 154 lb 8.7 oz (70.1 kg) (04/08 0551) Did not examine  Lab Results  Recent Labs  06/05/13 0454 06/06/13 0338  WBC 8.9 9.2  HGB 9.8* 10.4*  HCT 29.6* 30.9*  NA 127* 127*  K 4.5 4.4  CL 95* 93*  CO2 23 24  BUN 6 5*  CREATININE 0.63 0.59   Liver Panel  Recent Labs  06/05/13 0454  06/06/13 0338  PROT 5.7* 5.6*  ALBUMIN 1.5* 1.5*  AST 15 14  ALT 12 9  ALKPHOS 642* 621*  BILITOT 2.3* 1.9*   Sedimentation Rate No results found for this basename: ESRSEDRATE,  in the last 72 hours C-Reactive Protein No results found for this basename: CRP,  in the last 72 hours  Microbiology: 4/5 blood cx ngtd 4/4 urine cx ngtd Studies/Results: No results found.   Assessment/Plan: Presumed GI infection - can change micafungin to fluconazole 400mg  daily by mouth. Keep on cefepime nad metronidazole IV for now and can transition to cipro 500mg  bid oral for discharge to treat for total of 10 days. Currently on day 5 of 10.  [facility] for Infectious Diseases Cell: [phone] Pager: [phone]  06/06/2013, 3:47 PM

## 2013-06-07 DIAGNOSIS — Z515 Encounter for palliative care: Secondary | ICD-10-CM

## 2013-06-07 DIAGNOSIS — R531 Weakness: Secondary | ICD-10-CM

## 2013-06-07 DIAGNOSIS — G893 Neoplasm related pain (acute) (chronic): Secondary | ICD-10-CM

## 2013-06-07 DIAGNOSIS — E876 Hypokalemia: Secondary | ICD-10-CM

## 2013-06-07 DIAGNOSIS — E860 Dehydration: Secondary | ICD-10-CM

## 2013-06-07 DIAGNOSIS — R5383 Other fatigue: Secondary | ICD-10-CM

## 2013-06-07 DIAGNOSIS — R5381 Other malaise: Secondary | ICD-10-CM

## 2013-06-07 LAB — TYPE AND SCREEN
ABO/RH(D): A POS
ANTIBODY SCREEN: POSITIVE
DAT, IgG: POSITIVE
Donor AG Type: NEGATIVE
Donor AG Type: NEGATIVE
UNIT DIVISION: 0
UNIT DIVISION: 0

## 2013-06-07 LAB — BASIC METABOLIC PANEL
BUN: 6 mg/dL (ref 6–23)
CO2: 23 meq/L (ref 19–32)
Calcium: 10.6 mg/dL — ABNORMAL HIGH (ref 8.4–10.5)
Chloride: 92 mEq/L — ABNORMAL LOW (ref 96–112)
Creatinine, Ser: 0.58 mg/dL (ref 0.50–1.10)
GFR calc Af Amer: >90 mL/min (ref >90)
GFR calc non Af Amer: >90 mL/min (ref >90)
Glucose, Bld: 86 mg/dL (ref 70–99)
Potassium: 4.3 mEq/L (ref 3.7–5.3)
Sodium: 128 mEq/L — ABNORMAL LOW (ref 137–147)

## 2013-06-07 LAB — CBC
HCT: 33.3 % — ABNORMAL LOW (ref 36.0–46.0)
Hemoglobin: 10.6 g/dL — ABNORMAL LOW (ref 12.0–15.0)
MCH: 26.8 pg (ref 26.0–34.0)
MCHC: 31.8 g/dL (ref 30.0–36.0)
MCV: 84.3 fL (ref 78.0–100.0)
PLATELETS: 348 10*3/uL (ref 150–400)
RBC: 3.95 MIL/uL (ref 3.87–5.11)
RDW: 16 % — ABNORMAL HIGH (ref 11.5–15.5)
WBC: 11.2 10*3/uL — ABNORMAL HIGH (ref 4.0–10.5)

## 2013-06-07 MED ORDER — SENNOSIDES-DOCUSATE SODIUM 8.6-50 MG PO TABS
1.0000 | ORAL_TABLET | Freq: Two times a day (BID) | ORAL | Status: DC
  Administered 2013-06-07 – 2013-06-11 (×8): 1 via ORAL
  Filled 2013-06-07 (×11): qty 1

## 2013-06-07 MED ORDER — DILTIAZEM HCL ER COATED BEADS 120 MG PO CP24
120.0000 mg | ORAL_CAPSULE | Freq: Every day | ORAL | Status: DC
  Administered 2013-06-08 – 2013-06-11 (×4): 120 mg via ORAL
  Filled 2013-06-07 (×4): qty 1

## 2013-06-07 MED ORDER — DILTIAZEM HCL ER COATED BEADS 120 MG PO CP24
120.0000 mg | ORAL_CAPSULE | Freq: Every day | ORAL | Status: DC
  Filled 2013-06-07: qty 1

## 2013-06-07 NOTE — Progress Notes (Signed)
[name] LYY:[id number] DOB: [birth year] DOA: 06/02/2013 PCP: [name] Grant, MD  Brief narrative:  62 y/o ? 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 Htn recent Hospital stay 05/04/13-->05/15/13 Asc.Cholangitis with [place]erratia and C [place] bacteremia completing Cipro/Fluconazole 3/24 was  re-admitted to [facility] c Potential UTI, presyncopal episode along with nausea and vomiting prompting thought of either Pyelonephritis/obstructive jaundice/advanced cervical cancer with metastases as CT scan of abdomen 05/23/13 = interval development of omental and notes concerning for new metastatic involvement with slight progression of hepatoduodenal ligament and retroperitoneum lymphadenopathy On admission had low blood pressures 90s over 60s ,Sodium 128, BUN/creatinine 8/0.5, alkaline phosphatase 874, albumin 1.6, hemoglobin 9, HCT 26, alkaline phosphatase 874 , total bilirubin 2.4, lactic acid 1.8, point-of-care troponin 0.3 Given consideration for urinary tract infection empirically Rocephin and vancomycin   Past medical history-As per Problem list Chart reviewed as below-  Consultants:   interventional radiology  Oncology   Procedures: acute abdominal series    acute abdominal series 06/02/13  Chest x-ray 06/02/48   Antibiotics:   Rocephin 06/02/13 -06/03/13   Zosyn 06/03/13   Vancomycin 06/02/13-06/03/13   Micafungin 06/04/13   Subjective   Still having R sided abd pain, improved from yesterday    Objective    Interim History:  reviewed  Telemetry:  atrial fibrillation rates 100-150   Objective: Filed Vitals:   06/06/13 2059 06/07/13 0626 06/07/13 1040 06/07/13 1218  BP: 124/79 113/83 128/78 116/80  Pulse: 126 125 104   Temp: 98 F (36.7 C) 98.2 F (36.8 C)    TempSrc: Oral Oral    Resp: 18 18      Height:      Weight:  69.491 kg (153 lb 3.2 oz)    SpO2: 98% 96%      Intake/Output Summary (Last 24 hours) at 06/07/13 1509 Last data filed at 06/07/13 1226  Gross per 24 hour  Intake    420 ml  Output    975 ml  Net   -555 ml    Exam:  General:  alert, tired-appearing pleasant Caucasian female  Cardiovascular:  Tachycardic, S1-S2 atrial fibrillation noted Respiratory: Clinically clear no added sounds Abdomen: Nondistended however multiple drains in place, right upper quadrant mild tenderness on palpation, Diminished BS Skin no lower extremity edema but some lymphedema of the left lower extremity which is chronic Neuro intact  Data Reviewed: Basic Metabolic Panel:  Recent Labs Lab 06/03/13 0354 06/04/13 0445 06/05/13 0454 06/06/13 0338 06/07/13 0402  NA 128* 129* 127* 127* 128*  K 4.0 4.7 4.5 4.4 4.3  CL 95* 97 95* 93* 92*  CO2 $Re'23 22 23 24 23  'cQJ$ GLUCOSE 86 81 86 110* 86  BUN $Re'8 7 6 'yFC$ 5* 6  CREATININE 0.59 0.68 0.63 0.59 0.58  CALCIUM 9.5 9.5 9.8 9.7 10.6*   Liver Function Tests:  Recent Labs Lab 06/02/13 1645 06/03/13 0354 06/04/13 0445 06/05/13 0454 06/06/13 0338  AST $Re'28 28 19 15 14  'FYO$ ALT $R'16 16 15 12 9  'pf$ ALKPHOS 918* 874* 734* 642* 621*  BILITOT 2.6* 2.4* 2.3* 2.3* 1.9*  PROT 7.5 6.0 5.8* 5.7* 5.6*  ALBUMIN 2.0* 1.6* 1.5* 1.5* 1.5*    Recent Labs Lab 06/02/13 1645  LIPASE 8*   No results found for this basename: AMMONIA,  in the last 168 hours CBC:  Recent Labs Lab 06/02/13 1645  06/04/13 0445 06/04/13 0907 06/05/13 0454 06/06/13 0338 06/07/13 0402  WBC 10.0  < > 6.7 5.2 8.9 9.2 11.2*  NEUTROABS 8.2*  --   --   --   --   --   --   HGB 10.4*  < > 8.4* 8.1* 9.8* 10.4* 10.6*  HCT 32.3*  < > 25.6* 24.7* 29.6* 30.9* 33.3*  MCV 84.8  < > 84.8 85.2 83.6 83.1 84.3  PLT 369  < > 276 249 299 309 348  < > = values in this interval not displayed. Cardiac Enzymes:  Recent Labs Lab 06/02/13 1645  TROPONINI <0.30   BNP: No components found with  this basename: POCBNP,  CBG: No results found for this basename: GLUCAP,  in the last 168 hours  Recent Results (from the past 240 hour(s))  URINE CULTURE     Status: None   Collection Time    06/02/13  5:11 PM      Result Value Ref Range Status   Specimen Description URINE, CATHETERIZED   Final   Special Requests NONE   Final   Culture  Setup Time     Final   Value: 06/02/2013 21:57     Performed at [facility] Count     Final   Value: NO GROWTH     Performed at [facility]   Culture     Final   Value: NO GROWTH     Performed at [facility]   Report Status 06/03/2013 FINAL   Final  CULTURE, BLOOD (ROUTINE X 2)     Status: None   Collection Time    06/03/13 10:55 AM      Result Value Ref Range Status   Specimen Description BLOOD LEFT ARM  1 ML IN AEROBIC ONLY   Final   Special Requests Immunocompromised   Final   Culture  Setup Time     Final   Value: 06/03/2013 18:35     Performed at [facility]   Culture     Final   Value:        BLOOD CULTURE RECEIVED NO GROWTH TO DATE CULTURE WILL BE HELD FOR 5 DAYS BEFORE ISSUING A FINAL NEGATIVE REPORT     Performed at [facility]   Report Status PENDING   Incomplete  CULTURE, BLOOD (ROUTINE X 2)     Status: None   Collection Time    06/03/13 11:19 AM      Result Value Ref Range Status   Specimen Description BLOOD LEFT ARM  2 ML IN [facility] BOTTLE   Final   Special Requests Immunocompromised   Final   Culture  Setup Time     Final   Value: 06/03/2013 18:35     Performed at [facility]   Culture     Final   Value:        BLOOD CULTURE RECEIVED NO GROWTH TO DATE CULTURE WILL BE HELD FOR 5 DAYS BEFORE ISSUING A FINAL NEGATIVE REPORT     Performed at [facility]   Report Status PENDING   Incomplete     Studies:              All Imaging reviewed and is as per above notation   Scheduled Meds: . ceFEPime (MAXIPIME) IV  1 g Intravenous 3 times per day  . [START ON  06/08/2013] diltiazem  120 mg Oral Daily  . docusate sodium  100 mg Oral BID  . fentaNYL  200 mcg Transdermal Q72H  . fluconazole  400 mg Oral Daily  . folic acid  1 mg Oral q morning - 10a  . loratadine  10 mg Oral Daily  . metoprolol  25 mg Oral BID  . metronidazole  500 mg Intravenous Q8H  . pantoprazole  40 mg Oral Daily  . simethicone  80 mg Oral QID  . sodium chloride  1 g Oral BID WC   Continuous Infusions:    Assessment/Plan: 1. Fever/Sepsis    - Potential cholangitis vs. Malignancy related fever vs another abd source for fever    - LFTS/ALP trending down    -hold off on [place] Abd, since unlikely to change management    -biliary drains working reasonably well    -continue Cefepime, Dr.Samtani discussed with ID physician 06/04/13 who recommended Micafungin then, now of with FLuconazole.  Blood cultures x2 negative,  urine culture negative- given negative Blood cultures will hold off on Port removal    -conservative mgt since likely headed towards comfort oriented care, palliative meeting today  2. Anemia of malignancy     - transfused 1 u prbc this admission. Anemia panel confirms AOCD/AoMalignancy  3. Presyncopal episode       -due to 1/2   4. Euvolemic hyponatremia with possible tea/toast potomania the goal-secondary to not eating-increase solid diet, salt tablets.  Baseline sodium is 134.  5. Atrial fibrillation with rapid ventricular response-       -HR still fluctuating, continue metoprolol/diltiazem, watch closely for bradycardia, HR dropped to 40s 2 days ago, now consistently up, increase dilatizem       -may have a component of tachybrady syndrome but not candidate for Pacer  6. Metastatic cervical cancer-was supposed to start new Chemo regimen, Topotecan-CT scan from 3/25 showed spread to the omentum and slight progression of hepatoduodenal ligament lymphadenopathy, oncology following, poor prognosis, Palliative consulted for goals of Care, meeting today, patient leaning  towards home with hospice         -Pain of malignancy, continue fentanyl patch, dilaudid PO and IV PRN  7. Hypokalemia potassium has been replaced   8. Adult failure to thrive-as above  9. Prior DVTs left lower extremity 10/2010, 04/2011 status post IVC filter-Restarted Xarelto 06/04/48, per ONc recs will stop this due to risk of bleeding given liver disease  Code Status: Full  Family Communication: discussed with patient at bedside Disposition Plan: Inpatient pending further goals of care discussion   Domenic Polite, MD  [facility] Pager [phone] 06/07/2013, 3:09 PM    LOS: 5 days

## 2013-06-07 NOTE — Consult Note (Signed)
Patient WC:[name]      DOB: 1950/11/10      POE:[id number]     Consult Note from the Palliative Medicine Team at [facility] Requested by: Dr Broadus John     PCP: Gwendolyn Grant, MD Reason for Consultation:Clarificaation of [place] and options     Phone Number:[phone]  Assessment of patients Current state: [name] is a 63 y.o. female with a history of metastatic cervical cancer with recent progression of disease on multiple chemotherapy agents.  Continued physical and functional decline.    Faced with advanced directive questions and anticipatory care needs.  Patient clearly verbalizing understanding of her limited prognosis and desire for comfort.   This NP Wadie Lessen reviewed medical records, received report from team, assessed the patient and then meet at the patient's bedside along with her son, life partner of 32 years, her two sisters and a cousin  to discuss diagnosis prognosis, GOC, EOL wishes disposition and options.  A detailed discussion was had today regarding advanced directives.  Concepts specific to code status, artifical feeding and hydration, continued IV antibiotics and rehospitalization was had.  The difference between a aggressive medical intervention path  and a palliative comfort care path for this patient at this time was had.  Values and goals of care important to patient and family were attempted to be elicited.  Concept of Hospice and Palliative Care were discussed  Natural trajectory and expectations at EOL were discussed.  Questions and concerns addressed.  Hard Choices booklet left for review. Family encouraged to call with questions or concerns.  PMT will continue to support holistically.   Goals of Care: 1.  Code Status:  DNR/DNI   2. Scope of Treatment: 1. Vital Signs: per shift 2. Respiratory/Oxygen:as needed for comfort 3. Nutritional Support/Tube Feeds:no artifical feeding now or in the future 4. Antibiotics:convert IV  antibiotic to oral agents and continue on discharge  5. RXV:QMGQ once discahrged   3. Disposition:  Home [facility].     4. Symptom Management:   1. Anxiety/Agitation: Ativan 1 mg PO every 4 hrs prn  2. Pain: continue current medications as written, patient requests nursing to respond in timely fashion to her request "I can't wait  When I need it I need it, the pain gets out of control really fast", discussed with nursing staff.    Discussed in detail the option of utilizing her Port a Cath for continuous infusion.  She is open to that concept in the future if she "needs it".  ID agrees that PAC is viable option, no real concern for infection. Family encouraged to discuss with hospice agency. irit         3. Bowel Regimen: Senna-s one tablet BID/Dulcolax supp prn 4. Weakness:   5. Psychosocial:  Emotional support offered to patient and her family  6. Spiritual:  Chaplain consulted at patient request  Patient Documents Completed or Given: Document Given Completed  Advanced Directives Pkt    MOST  yes  DNR    Gone from My [facility]    Hard Choices yes      Brief HPI: [name] is a 63 y.o. female with a history of metastatic cervical cancer with recent progression of disease on multiple chemotherapy agents.  Continued physical and functional decline.    Faced with advanced directive questions and anticipatory care needs.  Patient clearly verbalizing understanding of her limited prognosis and desire for comfort.  ONA:[id number], fatigue   PMH:  Past Medical History  Diagnosis Date  . Diastolic dysfunction   . MIGRAINE HEADACHE   . Atrial fibrillation     failed DCCV, CHADs2-prev pradaxa stopped due to hematuria with ureter issues/JJ   . Anemia   . HTN (hypertension)   . GLAUCOMA   . CARPAL TUNNEL SYNDROME, RIGHT   . Lymphedema     L>R leg from pelvic XRT/scarring  . Hepatic steatosis     CT scan 06/2009, 12/2009  . DVT (deep venous  thrombosis) 10/2010    LLE, anticoag resumed  . CHF (congestive heart failure)     1 yr ago per pt  . Arthritis   . GERD (gastroesophageal reflux disease)   . Small kidney     left  . Radiation 10/11/11-11/19/11    5040 cGy in 28 fx's/periaortic  . Radiation 03/16/10-03/25/10    right inguinal node/left external iliac node  . Radiation 06/11/08-07/23/08    6000 cGy left pelvis  . Hyperlipidemia   . Leg swelling     left  . Cervical cancer dx 04/2003, recur 04/2008    s/p debulk (TAHBSO), chemo/XRT; recurrent dz L pelvis dx 2010 and liver met 09/2010 s/p RFA  . Osteoporosis with fracture 2014    compression fx, s/p KP ; started prolia 06/2012- narcotic pain mgmt  . Ascending cholangitis 05/04/2013  . Gram-negative bacteremia 05/06/2013  . 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 Ureteral stent placement  2005, 01/2010, 07/2010    L hydro related to cervical ca and XRT  . Oophorectomy  2005  . Cardioversion    . Tonsillectomy    . Cholecystectomy  1984  . Total abdominal hysterectomy  2005  . Carpal tunnel  2009    right  . Ivc filter  04/2011   I have reviewed the FH and SH and  If appropriate update it with new information. Allergies  Allergen Reactions  . Codeine Other (See Comments)    Breathing issues  . Flecainide Acetate Other (See Comments)    Leg cramping   . Morphine Other (See Comments)     Hallucinations  . Propafenone Hcl Other (See Comments)    Leg cramps   Scheduled Meds: . ceFEPime (MAXIPIME) IV  1 g Intravenous 3 times per day  . [START ON 06/08/2013] diltiazem  120 mg Oral Daily  . docusate sodium  100 mg Oral BID  . fentaNYL  200 mcg Transdermal Q72H  . fluconazole  400 mg Oral Daily  . folic acid  1 mg Oral q morning - 10a  . loratadine  10 mg Oral Daily  . metoprolol  25 mg Oral BID  . metronidazole  500 mg Intravenous Q8H  . pantoprazole  40 mg Oral Daily  . simethicone  80 mg Oral QID  .  sodium chloride  1 g Oral BID WC   Continuous Infusions:  PRN Meds:.acetaminophen, acetaminophen, albuterol, alum & mag hydroxide-simeth, bisacodyl, fluticasone, HYDROmorphone (DILAUDID) injection, HYDROmorphone, lidocaine-prilocaine, LORazepam, ondansetron (ZOFRAN) IV, ondansetron, sodium chloride, zolpidem    BP 116/80  Pulse 104  Temp(Src) 98.2 F (36.8 C) (Oral)  Resp 18  Ht 5\' 1"  (1.549 m)  Wt 69.491 kg (153 lb 3.2 oz)  BMI 28.96 kg/m2  SpO2 96%   PPS:30 %   Intake/Output Summary (Last 24 hours) at 06/07/13 1359 Last data filed at 06/07/13 1226  Gross per 24 hour  Intake  420 ml  Output    975 ml  Net   -555 ml    Physical Exam:  General: chronically ill appearing, weak, NAD HEENT:  Moist membranes, no exudate noted Chest:   CTA CVS: tachycardic, irregular  Abdomen: RUQ tenderness Ext:  Without edema Neuro:  Alert and oriented X3, fully engaged in conversation  Labs: CBC    Component Value Date/Time   WBC 11.2* 06/07/2013 0402   WBC 6.5 05/21/2013 1114   RBC 3.95 06/07/2013 0402   RBC 3.50* 06/04/2013 1210   RBC 3.16* 05/21/2013 1114   HGB 10.6* 06/07/2013 0402   HGB 9.0* 05/21/2013 1114   HCT 33.3* 06/07/2013 0402   HCT 27.8* 05/21/2013 1114   PLT 348 06/07/2013 0402   PLT 310 05/21/2013 1114   MCV 84.3 06/07/2013 0402   MCV 88.0 05/21/2013 1114   MCH 26.8 06/07/2013 0402   MCH 28.5 05/21/2013 1114   MCHC 31.8 06/07/2013 0402   MCHC 32.4 05/21/2013 1114   RDW 16.0* 06/07/2013 0402   RDW 15.4* 05/21/2013 1114   LYMPHSABS 0.9 06/02/2013 1645   LYMPHSABS 0.9 05/21/2013 1114   MONOABS 0.9 06/02/2013 1645   MONOABS 0.8 05/21/2013 1114   EOSABS 0.0 06/02/2013 1645   EOSABS 0.1 05/21/2013 1114   BASOSABS 0.0 06/02/2013 1645   BASOSABS 0.0 05/21/2013 1114    BMET    Component Value Date/Time   NA 128* 06/07/2013 0402   NA 134* 05/21/2013 1115   K 4.3 06/07/2013 0402   K 3.8 05/21/2013 1115   CL 92* 06/07/2013 0402   CL 108* 08/15/2012 1252   CO2 23 06/07/2013 0402   CO2 21* 05/21/2013  1115   GLUCOSE 86 06/07/2013 0402   GLUCOSE 103 05/21/2013 1115   GLUCOSE 119* 08/15/2012 1252   BUN 6 06/07/2013 0402   BUN 18.2 05/21/2013 1115   CREATININE 0.58 06/07/2013 0402   CREATININE 0.9 05/21/2013 1115   CREATININE 1.45* 09/11/2010 1603   CALCIUM 10.6* 06/07/2013 0402   CALCIUM 9.1 05/21/2013 1115   GFRNONAA >90 06/07/2013 0402   GFRNONAA 37* 09/11/2010 1603   GFRAA >90 06/07/2013 0402   GFRAA 45* 09/11/2010 1603    CMP     Component Value Date/Time   NA 128* 06/07/2013 0402   NA 134* 05/21/2013 1115   K 4.3 06/07/2013 0402   K 3.8 05/21/2013 1115   CL 92* 06/07/2013 0402   CL 108* 08/15/2012 1252   CO2 23 06/07/2013 0402   CO2 21* 05/21/2013 1115   GLUCOSE 86 06/07/2013 0402   GLUCOSE 103 05/21/2013 1115   GLUCOSE 119* 08/15/2012 1252   BUN 6 06/07/2013 0402   BUN 18.2 05/21/2013 1115   CREATININE 0.58 06/07/2013 0402   CREATININE 0.9 05/21/2013 1115   CREATININE 1.45* 09/11/2010 1603   CALCIUM 10.6* 06/07/2013 0402   CALCIUM 9.1 05/21/2013 1115   PROT 5.6* 06/06/2013 0338   PROT 6.4 05/21/2013 1115   ALBUMIN 1.5* 06/06/2013 0338   ALBUMIN 1.8* 05/21/2013 1115   AST 14 06/06/2013 0338   AST 22 05/21/2013 1115   ALT 9 06/06/2013 0338   ALT 14 05/21/2013 1115   ALKPHOS 621* 06/06/2013 0338   ALKPHOS 665* 05/21/2013 1115   BILITOT 1.9* 06/06/2013 0338   BILITOT 2.11* 05/21/2013 1115   GFRNONAA >90 06/07/2013 0402   GFRNONAA 37* 09/11/2010 1603   GFRAA >90 06/07/2013 0402   GFRAA 45* 09/11/2010 1603     Time In Time Out Total Time Spent with  Patient Total Overall Time  1400 1600 110 min 120 min    Greater than 50%  of this time was spent counseling and coordinating care related to the above assessment and plan.   Wadie Lessen NP  Palliative Medicine Team Team Phone # [phone] Pager [phone]  Discussed with Dr

## 2013-06-07 NOTE — Progress Notes (Signed)
[place]  Telephone:(336) [place] NOTE I have seen the patient, examined her and edited the notes as follows: Events since 06/06/13 noted. No further emesis.Frequent nausea.  Last bowel movement on Saturday, not resolved with laxatives on 4/8.  Abdominal bloating present. She complains of left abdominal pain, but is better controlled with meds. No respiratory or cardiac complaints. No confusion.  The patient denies any recent signs or symptoms of bleeding such as spontaneous epistaxis, hematuria or hematochezia.  MEDICATIONS:  Scheduled Meds: . ceFEPime (MAXIPIME) IV  1 g Intravenous 3 times per day  . diltiazem  180 mg Oral Daily  . docusate sodium  100 mg Oral BID  . fentaNYL  200 mcg Transdermal Q72H  . fluconazole  400 mg Oral Daily  . folic acid  1 mg Oral q morning - 10a  . loratadine  10 mg Oral Daily  . metoprolol  25 mg Oral BID  . metronidazole  500 mg Intravenous Q8H  . pantoprazole  40 mg Oral Daily  . simethicone  80 mg Oral QID  . sodium chloride  1 g Oral BID WC   Continuous Infusions:   PRN Meds:.acetaminophen, acetaminophen, albuterol, alum & mag hydroxide-simeth, bisacodyl, fluticasone, HYDROmorphone (DILAUDID) injection, HYDROmorphone, lidocaine-prilocaine, LORazepam, ondansetron (ZOFRAN) IV, ondansetron, sodium chloride, zolpidem ALLERGIES:   Allergies  Allergen Reactions  . Codeine Other (See Comments)    Breathing issues  . Flecainide Acetate Other (See Comments)    Leg cramping   . Morphine Other (See Comments)     Hallucinations  . Propafenone Hcl Other (See Comments)    Leg cramps     PHYSICAL EXAMINATION:   Filed Vitals:   06/07/13 0626  BP: 113/83  Pulse: 125  Temp: 98.2 F (36.8 C)  Resp: 18     Filed Weights   06/05/13 0556 06/06/13 0551 06/07/13 0626  Weight: 156 lb 9.6 oz (71.033 kg) 154 lb 8.7 oz (70.1 kg) 153 lb 3.2 oz (69.37 kg)    63 year old white in no acute distress, but tearful  due to her current status, alert and oriented. Ill appearing EYES: Pale conjunctiva, no appreciable scleral icterus.  ENT.Oral cavity without thrush or lesions. Poor dentition.  Lungs clear bilaterally . No wheezing, rhonchi or rales. Right port non tender.  Cardiac: irregularly irregular rate and rhythm,no murmur , rubs or gallops  Abdomen distended, tender in both upper quadrants.diminished bowel sounds. Biliary drains present. Extremities no clubbing cyanosis,trace edema left lower extremity .  SKIN: No petechial rash.  Neuro: No focal or motor deficits.  LABORATORY/RADIOLOGY DATA:   Recent Labs Lab 06/02/13 1645  06/04/13 0445 06/04/13 0907 06/05/13 0454 06/06/13 0338 06/07/13 0402  WBC 10.0  < > 6.7 5.2 8.9 9.2 11.2*  HGB 10.4*  < > 8.4* 8.1* 9.8* 10.4* 10.6*  HCT 32.3*  < > 25.6* 24.7* 29.6* 30.9* 33.3*  PLT 369  < > 276 249 299 309 348  MCV 84.8  < > 84.8 85.2 83.6 83.1 84.3  MCH 27.3  < > 27.8 27.9 27.7 28.0 26.8  MCHC 32.2  < > 32.8 32.8 33.1 33.7 31.8  RDW 15.3  < > 15.7* 15.5 15.8* 16.2* 16.0*  LYMPHSABS 0.9  --   --   --   --   --   --   MONOABS 0.9  --   --   --   --   --   --   EOSABS 0.0  --   --   --   --   --   --  BASOSABS 0.0  --   --   --   --   --   --   < > = values in this interval not displayed.  CMP    Recent Labs Lab 06/02/13 1645 06/03/13 0354 06/04/13 0445 06/05/13 0454 06/06/13 0338 06/07/13 0402  NA 126* 128* 129* 127* 127* 128*  K 3.4* 4.0 4.7 4.5 4.4 4.3  CL 90* 95* 97 95* 93* 92*  CO2 23 23 22 23 24 23   GLUCOSE 108* 86 81 86 110* 86  BUN 11 8 7 6  5* 6  CREATININE 0.65 0.59 0.68 0.63 0.59 0.58  CALCIUM 10.8* 9.5 9.5 9.8 9.7 10.6*  AST 28 28 19 15 14   --   ALT 16 16 15 12 9   --   ALKPHOS 918* 874* 734* 642* 621*  --   BILITOT 2.6* 2.4* 2.3* 2.3* 1.9*  --         Component Value Date/Time   BILITOT 1.9* 06/06/2013 0338   BILITOT 2.11* 05/21/2013 1115   BILIDIR 1.9* 05/09/2013 0515   IBILI 1.0* 05/09/2013 0515    Anemia  panel:    Recent Labs  06/04/13 1210 06/04/13 1354  VITAMINB12  --  1508*  FOLATE  --  14.5  FERRITIN  --  531*  TIBC  --  Not calculated due to Iron <10.  IRON  --  <10*  RETICCTPCT 2.0  --      Recent Labs Lab 06/04/13 0907 06/05/13 0454 06/06/13 0338  INR 1.80* 5.76* 2.22*      Liver Function Tests:  Recent Labs Lab 06/02/13 1645 06/03/13 0354 06/04/13 0445 06/05/13 0454 06/06/13 0338  AST 28 28 19 15 14   ALT 16 16 15 12 9   ALKPHOS 918* 874* 734* 642* 621*  BILITOT 2.6* 2.4* 2.3* 2.3* 1.9*  PROT 7.5 6.0 5.8* 5.7* 5.6*  ALBUMIN 2.0* 1.6* 1.5* 1.5* 1.5*   ASSESSMENT AND PLAN:  1.Metastatic cervical cancer  The patient has recently progressed on multiple chemotherapy agents. She is scheduled to receive Topotecan on 4/16. Her Port is still in place. It was originally planned to be removed on admission after infection was suspected, but cultures to date are negative. Her performance status is very poor. She may not be a candidate for systemic chemotherapy in the future. The patient understood she may not be able to improve to the point to receive treatment. I have discussed with the palliative care service and will redirect her focus of care and transition all her medications with plan to discharge her in the near future to home hospice program. Meeting with Hospice program on 4/9 at 2 pm Recommend we keep the Infuse-a-Port for now for later use as I do not see a benefit to removing it as it would put her through another unnecessary procedure.  2. Sepsis, likely intra-abdominal source  Cultures to date negative.On Cefepime plus IV metronidazole and fluconazole 400 mg daily (day 2).  Infectious disease team is following.  Removal of port was held due to coagulopathy. IV antibiotics are being transitioned  to oral medications if she can tolerate by mouth, including plans for Cipro 500 x 10 days upon discharge.  3. 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 Continue monitoring  4. Nausea and vomiting, abdominal bloating Suspect this is due to peritoneal carcinomatosis or infection. Her symptoms recurred. Continue supportive care with IV fluids. Consider change antiemetics to around the clock. Bowel support needed for constipation,  continue laxative program.   5. History of recurrent DVT, s/p IVC filter, currently coagulopathy   INR 2.2  after Vit K administration on 4/7. Cause could be due to liver disease, heparin, Xarelto and malnutrition.  INR today is pending. Had one more dose of vitamin K 10 mg oral on 4/8. Upon discharge, Xarelto should be discontinued in view of risk of bleeding. Patient has a IVC filter which is to protect her against Pulmonary Embolism.   6. Protein Calorie Malnutrition Continue nutritional supplement as tolerated  7. Anemia  This is likely anemia of chronic disease. The patient denies recent history of bleeding such as epistaxis, hematuria or hematochezia. She is asymptomatic from the anemia. Hb today is 10.4 We will observe for now. She does not require transfusion now.   8. Hyponatremia,  Likely due to dehydration. Recommend gentle IV fluid hydration as per primary service.   9. palliative care and discharge planning Recommend palliative care consult to see this patient and to transition her medications to outpatient regimen and for home hospice program. Discussed with the patient advanced directives and CODE STATUS.She is now  DO NOT RESUSCITATE and DO NOT INTUBATE.  Rondel Jumbo, PA-C 06/07/2013, 8:32 AM Heath Lark, MD 06/07/2013

## 2013-06-07 NOTE — Progress Notes (Signed)
ANTIBIOTIC CONSULT NOTE   Pharmacy Consult for cefepime Indication: Intra-abdominal infection  Allergies  Allergen Reactions  . Codeine Other (See Comments)    Breathing issues  . Flecainide Acetate Other (See Comments)    Leg cramping   . Morphine Other (See Comments)     Hallucinations  . Propafenone Hcl Other (See Comments)    Leg cramps    Patient Measurements: Height: 5\' 1"  (154.9 cm) Weight: 153 lb 3.2 oz ([name].491 kg) IBW/kg (Calculated) : 47.8  Vital Signs: Temp: 98.2 F (36.8 C) (04/09 0626) Temp src: Oral (04/09 0626) BP: 113/83 mmHg (04/09 0626) Pulse Rate: 125 (04/09 0626) Intake/Output from previous day: 04/08 0701 - 04/09 0700 In: 630 [P.O.:180; IV Piggyback:450] Out: 750 [Drains:750]  Labs:  Recent Labs  06/05/13 0454 06/06/13 0338 06/07/13 0402  WBC 8.9 9.2 11.2*  HGB 9.8* 10.4* 10.6*  PLT 299 309 348  CREATININE 0.63 0.59 0.58   Estimated Creatinine Clearance: 65.9 ml/min (by C-G formula based on Cr of 0.58).  Microbiology  Recent Results (from the past 720 hour(s))  BODY FLUID CULTURE     Status: None   Collection Time    05/08/13  2:20 PM      Result Value Ref Range Status   Specimen Description LIVER BILE FROM BILIARY DRAINS LEFT   Final   Special Requests Normal   Final   Gram Stain     Final   Value: NO WBC SEEN     NO ORGANISMS SEEN     Performed at [facility]   Culture     Final   Value: FEW ENTEROBACTER CLOACAE     Note: CRITICAL RESULT CALLED TO, READ BACK BY AND VERIFIED WITH: BESS B@10 :13AM ON 05/09/13 BY DANTS     FEW CANDIDA LUSITANIAE     Performed at [facility]   Report Status 05/13/2013 FINAL   Final   Organism ID, Bacteria ENTEROBACTER CLOACAE   Final  BODY FLUID CULTURE     Status: None   Collection Time    05/08/13  2:40 PM      Result Value Ref Range Status   Specimen Description LIVER Bile from biliary drain RIGHT   Final   Special Requests NONE   Final   Gram Stain     Final   Value: NO  WBC SEEN     NO ORGANISMS SEEN     Performed at [facility]   Culture     Final   Value: FEW ENTEROBACTER CLOACAE     Note: SUSCEPTIBILITIES PERFORMED ON PREVIOUS CULTURE WITHIN THE LAST 5 DAYS. CRITICAL RESULT CALLED TO, READ BACK BY AND VERIFIED WITH: BESS B@10 :13AM ON 05/09/13 BY DANTS     FEW CANDIDA LUSITANIAE     Performed at [facility]   Report Status 05/13/2013 FINAL   Final  CULTURE, BLOOD (SINGLE)     Status: None   Collection Time    05/28/13 11:30 AM      Result Value Ref Range Status   Organism ID, Bacteria NO GROWTH 5 DAYS   Final  CULTURE, BLOOD (SINGLE)     Status: None   Collection Time    05/28/13 12:00 PM      Result Value Ref Range Status   Organism ID, Bacteria NO GROWTH 5 DAYS   Final  URINE CULTURE     Status: None   Collection Time    06/02/13  5:11 PM  Result Value Ref Range Status   Specimen Description URINE, CATHETERIZED   Final   Special Requests NONE   Final   Culture  Setup Time     Final   Value: 06/02/2013 21:57     Performed at [place] [facility]     Final   Value: NO GROWTH     Performed at [facility]   Culture     Final   Value: NO GROWTH     Performed at [facility]   Report Status 06/03/2013 FINAL   Final  CULTURE, BLOOD (ROUTINE X 2)     Status: None   Collection Time    06/03/13 10:55 AM      Result Value Ref Range Status   Specimen Description BLOOD LEFT ARM  1 ML IN AEROBIC ONLY   Final   Special Requests Immunocompromised   Final   Culture  Setup Time     Final   Value: 06/03/2013 18:35     Performed at [facility]   Culture     Final   Value:        BLOOD CULTURE RECEIVED NO GROWTH TO DATE CULTURE WILL BE HELD FOR 5 DAYS BEFORE ISSUING A FINAL NEGATIVE REPORT     Performed at [facility]   Report Status PENDING   Incomplete  CULTURE, BLOOD (ROUTINE X 2)     Status: None   Collection Time    06/03/13 11:19 AM      Result Value Ref Range Status    Specimen Description BLOOD LEFT ARM  2 ML IN [facility] BOTTLE   Final   Special Requests Immunocompromised   Final   Culture  Setup Time     Final   Value: 06/03/2013 18:35     Performed at [facility]   Culture     Final   Value:        BLOOD CULTURE RECEIVED NO GROWTH TO DATE CULTURE WILL BE HELD FOR 5 DAYS BEFORE ISSUING A FINAL NEGATIVE REPORT     Performed at [facility]   Report Status PENDING   Incomplete     Anti-infectives: 4/4 >> Vancomycin >> 4/5 4/4 >> Rocephin >> 4/5 4/5 >> Zosyn >>4/6 4/6 >> Micafungin >>4/8 4/8 >> Fluconazole >> 4/6 >> Cefepime >> 4/6 >> Metronidazole >>  Assessment: [name] admitted 4/4 with possible IV catheter infection and UTI.  PMH includes metastatic cervical cancer, bili obtr s/p stenting and drain Feb 2015, chronic afib, hx DVT, recent hospitalization for sepsis, bacteremia and cholangitis.  She was initially started on IV vancomycin and ceftriaxone, then changed to Zosyn for intra-abdominal infection, then ID consult obtained and regimen adjusted tonow adjusted to cefepime, micafungin, and metronidazole due to hx of biliary infxn w/ Enterobacter (resistant to Zosyn) and C.lusitania, along with hx of Serratia bacteremia.    4/9:  Yesterday the antifungal regimen was de-escalated by ID to fluconazole 400 mg PO daily  Today is D#6/10 antibiotics, and D#4 of cefepime 1 gram IV q8h + metronidazole 500mg  IV q8h.  Afebrile.  WBC increasing  SCr stable, WNL  Blood Cx x 2 from 4/5: No growth to date  Urine Cx from 4/4: No growth (final)  Current cefepime and metronidazole dosages remain appropriate  Goals of therapy: Appropriate antibiotic dosing for renal function; eradication of infection.  Plan: 1. Continue cefepime 1 gram IV q8h + metronidazole 500 mg IV q8h. 2. Continue  fluconazole 400 mg PO daily as ordered by ID. 3. Please see ID recommendations regarding de-escalation to PO Cipro at discharge to complete a 10 day  course of antibiotics.  Clayburn Pert, PharmD, BCPS Pager: [phone] 06/07/2013  8:25 AM

## 2013-06-07 NOTE — Progress Notes (Signed)
PT Cancellation Note  Patient Details Name: [name] MRN: [id number] DOB: 1951/01/17   Cancelled Treatment:    Reason Eval/Treat Not Completed: spoke with MD who recommended we hold therapy again today. Pt is scheduled for palliative care meeting. Will await recommendations/plan.    Weston Anna, MPT Pager: [phone]

## 2013-06-07 NOTE — Progress Notes (Signed)
Pt too weak to ambulate this shift.

## 2013-06-07 NOTE — ED Provider Notes (Signed)
Medical screening examination/treatment/procedure(s) were performed by non-physician practitioner and as supervising physician I was immediately available for consultation/collaboration.   Leota Jacobsen, MD 06/07/13 1600

## 2013-06-08 ENCOUNTER — Telehealth: Payer: Self-pay

## 2013-06-08 ENCOUNTER — Inpatient Hospital Stay (HOSPITAL_COMMUNITY): Admission: RE | Admit: 2013-06-08 | Payer: 59 | Source: Ambulatory Visit

## 2013-06-08 DIAGNOSIS — E43 Unspecified severe protein-calorie malnutrition: Secondary | ICD-10-CM

## 2013-06-08 LAB — CBC
HCT: 30.4 % — ABNORMAL LOW (ref 36.0–46.0)
Hemoglobin: 9.9 g/dL — ABNORMAL LOW (ref 12.0–15.0)
MCH: 27.1 pg (ref 26.0–34.0)
MCHC: 32.6 g/dL (ref 30.0–36.0)
MCV: 83.3 fL (ref 78.0–100.0)
Platelets: 340 K/uL (ref 150–400)
RBC: 3.65 MIL/uL — ABNORMAL LOW (ref 3.87–5.11)
RDW: 16.1 % — ABNORMAL HIGH (ref 11.5–15.5)
WBC: 9.6 K/uL (ref 4.0–10.5)

## 2013-06-08 MED ORDER — HYDROMORPHONE HCL PF 10 MG/ML IJ SOLN
0.5000 mg/h | INTRAMUSCULAR | Status: DC
  Administered 2013-06-08 – 2013-06-09 (×2): 0.5 mg/h via INTRAVENOUS
  Filled 2013-06-08 (×2): qty 2.5

## 2013-06-08 MED ORDER — DILTIAZEM HCL ER COATED BEADS 300 MG PO CP24: 300.0000 mg | ORAL_CAPSULE | Freq: Every day | ORAL | Status: AC

## 2013-06-08 MED ORDER — METRONIDAZOLE 500 MG PO TABS
500.0000 mg | ORAL_TABLET | Freq: Three times a day (TID) | ORAL | Status: DC
  Administered 2013-06-08 – 2013-06-11 (×10): 500 mg via ORAL
  Filled 2013-06-08 (×12): qty 1

## 2013-06-08 MED ORDER — SENNOSIDES-DOCUSATE SODIUM 8.6-50 MG PO TABS: 1.0000 | ORAL_TABLET | Freq: Two times a day (BID) | ORAL | Status: AC

## 2013-06-08 MED ORDER — FLUCONAZOLE 200 MG PO TABS: 400.0000 mg | ORAL_TABLET | Freq: Every day | ORAL | Status: AC

## 2013-06-08 MED ORDER — METRONIDAZOLE 250 MG PO TABS: 250.0000 mg | ORAL_TABLET | Freq: Three times a day (TID) | ORAL | Status: AC

## 2013-06-08 MED ORDER — CIPROFLOXACIN HCL 500 MG PO TABS
500.0000 mg | ORAL_TABLET | Freq: Two times a day (BID) | ORAL | Status: DC
  Administered 2013-06-08 – 2013-06-11 (×7): 500 mg via ORAL
  Filled 2013-06-08 (×9): qty 1

## 2013-06-08 MED ORDER — CIPROFLOXACIN HCL 500 MG PO TABS: 500.0000 mg | ORAL_TABLET | Freq: Two times a day (BID) | ORAL | Status: AC

## 2013-06-08 MED ORDER — HYDROMORPHONE HCL 4 MG PO TABS: 4.0000 mg | ORAL_TABLET | ORAL | Status: DC | PRN

## 2013-06-08 NOTE — Progress Notes (Signed)
06/08/13 1300  Clinical Encounter Type  Visited With Patient  Visit Type Spiritual support;Social support (pt wishes to discuss EOL hopes and concerns)  Referral From [facility] consult)  Spiritual Encounters  Spiritual Needs Emotional;Prayer  Stress Factors  Patient Stress Factors Loss of control;Major life changes;Family relationships (facing EOL)   Made lengthy visit with [name], who was very open to pastoral support and used the visit to share and process her feelings about approaching the end of her life and how she wants to live.  We talked about important conversations and experiences she wants to have with family, particularly her desire for "quality time" and "the everyday little things, like sipping tea on the porch and having conversations [with her dear ones] without tears."  She longs for connection with her son, who, per pt, has been in some denial until now about how serious her condition is.  She also wishes for connection with her two grandsons, who are 85 and 54, and such a source of meaning and joy for her.  Provided pastoral listening, spiritual and emotional support, affirmation about the grieving process and self-care, and prayer at bedside.  Also encouraged her to use Hospice support resources, including chaplain, upon discharge.  [name] was tearful with gratitude for our connection and time together.  Please page as needs arise:  [phone].  Thank you.  [place], [place]

## 2013-06-08 NOTE — Telephone Encounter (Signed)
Roanna Epley that Dr.Khan will be attending physician for patient.  Patient's life expectancy is 6 months or less.  Hospice physicians can help with symptom management.

## 2013-06-08 NOTE — Progress Notes (Signed)
[place]  Telephone:(336) [place] NOTE I have seen the patient, examined her and edited the notes as follows Events since 06/07/13 noted. No further emesis.Frequent nausea. Last bowel movement on Saturday, not resolved with laxatives on 4/9.  Abdominal bloating present. She complains of left abdominal pain, but is better controlled with meds. No respiratory or cardiac complaints. No confusion.  The patient denies any recent signs or symptoms of bleeding such as spontaneous epistaxis, hematuria or hematochezia.  MEDICATIONS:  Scheduled Meds: . ceFEPime (MAXIPIME) IV  1 g Intravenous 3 times per day  . diltiazem  120 mg Oral Daily  . fentaNYL  200 mcg Transdermal Q72H  . fluconazole  400 mg Oral Daily  . folic acid  1 mg Oral q morning - 10a  . loratadine  10 mg Oral Daily  . metoprolol  25 mg Oral BID  . metronidazole  500 mg Intravenous Q8H  . pantoprazole  40 mg Oral Daily  . senna-docusate  1 tablet Oral BID  . simethicone  80 mg Oral QID  . sodium chloride  1 g Oral BID WC   Continuous Infusions:   PRN Meds:.acetaminophen, acetaminophen, albuterol, alum & mag hydroxide-simeth, bisacodyl, fluticasone, HYDROmorphone (DILAUDID) injection, HYDROmorphone, lidocaine-prilocaine, LORazepam, ondansetron (ZOFRAN) IV, ondansetron, sodium chloride, zolpidem ALLERGIES:   Allergies  Allergen Reactions  . Codeine Other (See Comments)    Breathing issues  . Flecainide Acetate Other (See Comments)    Leg cramping   . Morphine Other (See Comments)     Hallucinations  . Propafenone Hcl Other (See Comments)    Leg cramps     PHYSICAL EXAMINATION:   Filed Vitals:   06/07/13 2215  BP: 117/77  Pulse: 100  Temp: 98.1 F (36.7 C)  Resp: 20     Filed Weights   06/05/13 0556 06/06/13 0551 06/07/13 0626  Weight: 156 lb 9.6 oz (71.033 kg) 154 lb 8.7 oz (70.1 kg) 153 lb 3.2 oz (69.72 kg)    63 year old white in no acute distress, but tearful due  to her current status, alert and oriented. Ill appearing EYES: Pale conjunctiva, no appreciable scleral icterus.  ENT.Oral cavity without thrush or lesions. Poor dentition.  Lungs clear bilaterally . No wheezing, rhonchi or rales. Right port non tender.  Cardiac: irregularly irregular rate and rhythm,no murmur , rubs or gallops  Abdomen distended, tender in both upper quadrants worse on the left lower quadrant.diminished bowel sounds. Biliary drains present. Extremities no clubbing cyanosis,trace edema left lower extremity .  SKIN: No petechial rash.  Neuro: No focal or motor deficits.  LABORATORY/RADIOLOGY DATA:   Recent Labs Lab 06/02/13 1645  06/04/13 0907 06/05/13 0454 06/06/13 0338 06/07/13 0402 06/08/13 0402  WBC 10.0  < > 5.2 8.9 9.2 11.2* 9.6  HGB 10.4*  < > 8.1* 9.8* 10.4* 10.6* 9.9*  HCT 32.3*  < > 24.7* 29.6* 30.9* 33.3* 30.4*  PLT 369  < > 249 299 309 348 340  MCV 84.8  < > 85.2 83.6 83.1 84.3 83.3  MCH 27.3  < > 27.9 27.7 28.0 26.8 27.1  MCHC 32.2  < > 32.8 33.1 33.7 31.8 32.6  RDW 15.3  < > 15.5 15.8* 16.2* 16.0* 16.1*  LYMPHSABS 0.9  --   --   --   --   --   --   MONOABS 0.9  --   --   --   --   --   --  EOSABS 0.0  --   --   --   --   --   --   BASOSABS 0.0  --   --   --   --   --   --   < > = values in this interval not displayed.  CMP    Recent Labs Lab 06/02/13 1645 06/03/13 0354 06/04/13 0445 06/05/13 0454 06/06/13 0338 06/07/13 0402  NA 126* 128* 129* 127* 127* 128*  K 3.4* 4.0 4.7 4.5 4.4 4.3  CL 90* 95* 97 95* 93* 92*  CO2 23 23 22 23 24 23   GLUCOSE 108* 86 81 86 110* 86  BUN 11 8 7 6  5* 6  CREATININE 0.65 0.59 0.68 0.63 0.59 0.58  CALCIUM 10.8* 9.5 9.5 9.8 9.7 10.6*  AST 28 28 19 15 14   --   ALT 16 16 15 12 9   --   ALKPHOS 918* 874* 734* 642* 621*  --   BILITOT 2.6* 2.4* 2.3* 2.3* 1.9*  --         Component Value Date/Time   BILITOT 1.9* 06/06/2013 0338   BILITOT 2.11* 05/21/2013 1115   BILIDIR 1.9* 05/09/2013 0515   IBILI 1.0*  05/09/2013 0515    Anemia panel:   No results found for this basename: VITAMINB12, FOLATE, FERRITIN, TIBC, IRON, RETICCTPCT,  in the last 72 hours   Recent Labs Lab 06/04/13 0907 06/05/13 0454 06/06/13 0338  INR 1.80* 5.76* 2.22*      Liver Function Tests:  Recent Labs Lab 06/02/13 1645 06/03/13 0354 06/04/13 0445 06/05/13 0454 06/06/13 0338  AST 28 28 19 15 14   ALT 16 16 15 12 9   ALKPHOS 918* 874* 734* 642* 621*  BILITOT 2.6* 2.4* 2.3* 2.3* 1.9*  PROT 7.5 6.0 5.8* 5.7* 5.6*  ALBUMIN 2.0* 1.6* 1.5* 1.5* 1.5*   ASSESSMENT AND PLAN:  1.Metastatic cervical cancer  The patient has recently progressed on multiple chemotherapy agents. She is scheduled to receive Topotecan on 4/16. Her Port is still in place. It was originally planned to be removed on admission after infection was suspected, but cultures to date are negative. Her performance status is very poor. She may not be a candidate for systemic chemotherapy in the future. The patient understood she may not be able to improve to the point to receive treatment. Her focus of care and  all her medications have been transitioned to Palliative Care with plan to discharge her in the near future to home hospice program.   Infuse-a-Port will be kept for now for later use as removing  it would put her through another unnecessary procedure.   2. Sepsis, likely intra-abdominal source  Cultures to date negative.On Cefepime plus IV metronidazole and fluconazole 400 mg daily (day 3).  Infectious disease team is following.  Removal of port was held due to coagulopathy. IV antibiotics are being transitioned  to oral medications if she can tolerate by mouth, including plans for Cipro 500 x 10 days upon discharge.  3. 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 Continue monitoring  4. Nausea and vomiting, abdominal bloating Suspect this is due to peritoneal carcinomatosis or infection. Her symptoms  recurred. Continue supportive care with IV fluids while inpatient. Consider change antiemetics to around the clock. Bowel support needed for constipation. Patient is requesting a stronger laxative program, as the current one is not relieving symptoms.See is on Dulcolax 5 mg prn and Senokot 1 bid. Need to increase to 2  bid  Or consider suppository,since no bowel movement since admission. Increase mobility with PT/OT.No artificial feeding now or in the future, as requested by patient to the Palliative Care team  5. History of recurrent DVT, s/p IVC filter, currently coagulopathy   INR 2.2  after Vit K administration on 4/7. Cause could be due to liver disease, heparin, Xarelto and malnutrition.  Had one more dose of vitamin K 10 mg oral on 4/8. No further iNR was drawn.Upon discharge, Xarelto should be discontinued in view of risk of bleeding. Patient has a IVC filter which is to protect her against Pulmonary Embolism.   6. Protein Calorie Malnutrition Continue nutritional supplement as tolerated  7. Anemia  This is likely anemia of chronic disease. The patient denies recent history of bleeding such as epistaxis, hematuria or hematochezia. She is asymptomatic from the anemia. Hb today is 10.4 We will observe for now. She does not require transfusion now.   8. Hyponatremia,  Likely due to dehydration. Recommend gentle IV fluid hydration as per primary service.   9. palliative care and discharge planning Home with [facility] as per admitting team She is now  DO NOT RESUSCITATE and DO NOT INTUBATE. The patient is not ready to be discharged today. Hopefully we can transition her care today and discharge over the weekend. I would defer this to hospitalist. Rondel Jumbo, PA-C 06/08/2013, 7:24 AM Heath Lark, MD 06/08/2013

## 2013-06-08 NOTE — Progress Notes (Signed)
PT Cancellation Note and Discharge from PT   Patient Details Name: [name] MRN: [id number] DOB: 19-Sep-1950   Cancelled Treatment:    Reason Eval/Treat Not Completed: Other (comment) Pt has had 3 medical cancels this week.  Discussed pt care with RN who reports pt to d/c home with hospice likely tomorrow.  RN also reports pt getting up and ambulating to/from bathroom with staff without difficulty now.  PT will sign off.   Junius Argyle 06/08/2013, 12:24 PM Carmelia Bake, PT, DPT 06/08/2013 Pager: [phone]

## 2013-06-08 NOTE — Progress Notes (Addendum)
GRACELYN COVENTRY OIB:[id number] DOB: 09-12-1950 DOA: 06/02/2013 PCP: Gwendolyn Grant, MD  Brief narrative:  63 y/o ? 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 Htn recent Hospital stay 05/04/13-->05/15/13 Asc.Cholangitis with Serratia and C [place] bacteremia completing Cipro/Fluconazole 3/24 was  re-admitted to [facility] c Potential UTI, presyncopal episode along with nausea and vomiting prompting thought of either Pyelonephritis/obstructive jaundice/advanced cervical cancer with metastases as CT scan of abdomen 05/23/13 = interval development of omental and notes concerning for new metastatic involvement with slight progression of hepatoduodenal ligament and retroperitoneum lymphadenopathy On admission had low blood pressures 90s over 60s ,Sodium 128, BUN/creatinine 8/0.5, alkaline phosphatase 874, albumin 1.6, hemoglobin 9, HCT 26, alkaline phosphatase 874 , total bilirubin 2.4, lactic acid 1.8, point-of-care troponin 0.3 Given consideration for urinary tract infection empirically Rocephin and vancomycin   Past medical history-As per Problem list Chart reviewed as below-  Consultants:   interventional radiology  Oncology   Procedures: acute abdominal series    acute abdominal series 06/02/13  Chest x-ray 06/02/48   Antibiotics:   Rocephin 06/02/13 -06/03/13   Zosyn 06/03/13   Vancomycin 06/02/13-06/03/13   Micafungin 06/04/13   Subjective   Still having R sided abd pain, improved from yesterday    Objective    Interim History:  reviewed  Telemetry:  atrial fibrillation rates 100-150   Objective: Filed Vitals:   06/07/13 1218 06/07/13 1300 06/07/13 2215 06/08/13 0953  BP: 116/80 118/78 117/77 110/70  Pulse:  101 100 96  Temp:  98.1 F (36.7 C) 98.1 F (36.7 C)   TempSrc:  Oral Oral   Resp:  16 20     Height:      Weight:      SpO2:  96% 97%     Intake/Output Summary (Last 24 hours) at 06/08/13 1301 Last data filed at 06/08/13 0600  Gross per 24 hour  Intake    450 ml  Output    545 ml  Net    -95 ml    Exam:  General:  alert, tired-appearing pleasant Caucasian female  Cardiovascular:  Tachycardic, S1-S2 atrial fibrillation noted Respiratory: Clinically clear no added sounds Abdomen: Nondistended however multiple drains in place, right upper quadrant mild tenderness on palpation, Diminished BS Skin no lower extremity edema but some lymphedema of the left lower extremity which is chronic Neuro intact  Data Reviewed: Basic Metabolic Panel:  Recent Labs Lab 06/03/13 0354 06/04/13 0445 06/05/13 0454 06/06/13 0338 06/07/13 0402  NA 128* 129* 127* 127* 128*  K 4.0 4.7 4.5 4.4 4.3  CL 95* 97 95* 93* 92*  CO2 _0 GLUCOSE 86 81 86 110* 86  BUN _1 5* 6  CREATININE 0.59 0.68 0.63 0.59 0.58  CALCIUM 9.5 9.5 9.8 9.7 10.6*   Liver Function Tests:  Recent Labs Lab 06/02/13 1645 06/03/13 0354 06/04/13 0445 06/05/13 0454 06/06/13 0338  AST _2 ALT _3 ALKPHOS 918* 874* 734* 642* 621*  BILITOT 2.6* 2.4* 2.3* 2.3* 1.9*  PROT 7.5 6.0 5.8* 5.7* 5.6*  ALBUMIN 2.0* 1.6* 1.5* 1.5* 1.5*    Recent Labs Lab 06/02/13 1645  LIPASE 8*   No results found for this basename: AMMONIA,  in the last  168 hours CBC:  Recent Labs Lab 06/02/13 1645  06/04/13 0907 06/05/13 0454 06/06/13 0338 06/07/13 0402 06/08/13 0402  WBC 10.0  < > 5.2 8.9 9.2 11.2* 9.6  NEUTROABS 8.2*  --   --   --   --   --   --   HGB 10.4*  < > 8.1* 9.8* 10.4* 10.6* 9.9*  HCT 32.3*  < > 24.7* 29.6* 30.9* 33.3* 30.4*  MCV 84.8  < > 85.2 83.6 83.1 84.3 83.3  PLT 369  < > 249 299 309 348 340  < > = values in this interval not displayed. Cardiac Enzymes:  Recent Labs Lab 06/02/13 1645  TROPONINI <0.30   BNP: No components found with this basename: POCBNP,   CBG: No results found for this basename: GLUCAP,  in the last 168 hours  Recent Results (from the past 240 hour(s))  URINE CULTURE     Status: None   Collection Time    06/02/13  5:11 PM      Result Value Ref Range Status   Specimen Description URINE, CATHETERIZED   Final   Special Requests NONE   Final   Culture  Setup Time     Final   Value: 06/02/2013 21:57     Performed at [facility] Count     Final   Value: NO GROWTH     Performed at [facility]   Culture     Final   Value: NO GROWTH     Performed at [facility]   Report Status 06/03/2013 FINAL   Final  CULTURE, BLOOD (ROUTINE X 2)     Status: None   Collection Time    06/03/13 10:55 AM      Result Value Ref Range Status   Specimen Description BLOOD LEFT ARM  1 ML IN AEROBIC ONLY   Final   Special Requests Immunocompromised   Final   Culture  Setup Time     Final   Value: 06/03/2013 18:35     Performed at [facility]   Culture     Final   Value:        BLOOD CULTURE RECEIVED NO GROWTH TO DATE CULTURE WILL BE HELD FOR 5 DAYS BEFORE ISSUING A FINAL NEGATIVE REPORT     Performed at [facility]   Report Status PENDING   Incomplete  CULTURE, BLOOD (ROUTINE X 2)     Status: None   Collection Time    06/03/13 11:19 AM      Result Value Ref Range Status   Specimen Description BLOOD LEFT ARM  2 ML IN [facility] BOTTLE   Final   Special Requests Immunocompromised   Final   Culture  Setup Time     Final   Value: 06/03/2013 18:35     Performed at [facility]   Culture     Final   Value:        BLOOD CULTURE RECEIVED NO GROWTH TO DATE CULTURE WILL BE HELD FOR 5 DAYS BEFORE ISSUING A FINAL NEGATIVE REPORT     Performed at [facility]   Report Status PENDING   Incomplete     Studies:              All Imaging reviewed and is as per above notation   Scheduled Meds: . ciprofloxacin  500 mg Oral BID  . diltiazem  120 mg Oral Daily  . fentaNYL  200  mcg  Transdermal Q72H  . fluconazole  400 mg Oral Daily  . folic acid  1 mg Oral q morning - 10a  . loratadine  10 mg Oral Daily  . metoprolol  25 mg Oral BID  . metroNIDAZOLE  500 mg Oral 3 times per day  . pantoprazole  40 mg Oral Daily  . senna-docusate  1 tablet Oral BID  . simethicone  80 mg Oral QID  . sodium chloride  1 g Oral BID WC   Continuous Infusions: . HYDROmorphone 0.5 mg/hr (06/08/13 1238)    Assessment/Plan: 1. Fever/Sepsis    - Potential cholangitis vs. Malignancy related fever vs another abd source for fever    - LFTS/ALP trending down    -hold off on [place] Abd, since unlikely to change management    -biliary drains working reasonably well    -was on Cefepime, now Cipro/Flagyl and Fluconazole per ID recs for 5 more days  Blood cultures x2 negative,  urine culture negative- given negative Blood cultures will hold off on Port removal    -s/p palliative meeting, plan for home with hospice, comfort care  2. Anemia of malignancy     - transfused 1 u prbc this admission. Anemia panel confirms AOCD/AoMalignancy  3. Presyncopal episode       -due to 1/2   4. Euvolemic hyponatremia with possible tea/toast potomania the goal-secondary to not eating-increase solid diet, salt tablets.  Baseline sodium is 134.  5. Atrial fibrillation with rapid ventricular response-       -HR still fluctuating, continue metoprolol/diltiazem, watch closely for bradycardia, HR dropped to 40s 2 days ago, now consistently up, increase dilatizem       -may have a component of tachybrady syndrome but not candidate for Pacer  6. Metastatic cervical cancer-was supposed to start new Chemo regimen, Topotecan-CT scan from 3/25 showed spread to the omentum and slight progression of hepatoduodenal ligament lymphadenopathy, oncology following, poor prognosis, Palliative consulted for goals of Care, meeting today, patient leaning towards home with hospice         -Pain of malignancy, continue fentanyl patch,  dilaudid PO and IV PRN      -agreeable to try low dose Dilaudid gtt now, start 0.71m/hr  7. Hypokalemia potassium has been replaced   8. Adult failure to thrive-as above  9. 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

## 2013-06-08 NOTE — Progress Notes (Signed)
[facility]  Patient Status: inpatient  [name] is providing the following services: hospice pkg d with wheelchair  If patient discharges after hours, please call [phone].   Linward Headland 06/08/2013, 1:57 PM

## 2013-06-09 DIAGNOSIS — E440 Moderate protein-calorie malnutrition: Secondary | ICD-10-CM

## 2013-06-09 LAB — CBC
HCT: 30.7 % — ABNORMAL LOW (ref 36.0–46.0)
HEMOGLOBIN: 9.5 g/dL — AB (ref 12.0–15.0)
MCH: 26.5 pg (ref 26.0–34.0)
MCHC: 30.9 g/dL (ref 30.0–36.0)
MCV: 85.5 fL (ref 78.0–100.0)
PLATELETS: 290 10*3/uL (ref 150–400)
RBC: 3.59 MIL/uL — ABNORMAL LOW (ref 3.87–5.11)
RDW: 16.1 % — AB (ref 11.5–15.5)
WBC: 10 10*3/uL (ref 4.0–10.5)

## 2013-06-09 LAB — CULTURE, BLOOD (ROUTINE X 2)
Culture: NO GROWTH
Culture: NO GROWTH

## 2013-06-09 MED ORDER — MILK AND MOLASSES ENEMA
1.0000 | Freq: Once | RECTAL | Status: AC
  Administered 2013-06-09: 250 mL via RECTAL
  Filled 2013-06-09: qty 250

## 2013-06-09 MED ORDER — POLYETHYLENE GLYCOL 3350 17 G PO PACK
17.0000 g | PACK | Freq: Two times a day (BID) | ORAL | Status: DC
  Administered 2013-06-09 – 2013-06-11 (×5): 17 g via ORAL
  Filled 2013-06-09 (×6): qty 1

## 2013-06-09 NOTE — Progress Notes (Addendum)
[name] QIO:[id number] DOB: [birth year] DOA: 06/02/2013 PCP: Gwendolyn Grant, MD  Brief narrative:  63 y/o ? 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 Htn recent Hospital stay 05/04/13-->05/15/13 Asc.Cholangitis with Serratia and C [place] bacteremia completing Cipro/Fluconazole 3/24 was  re-admitted to [facility] c Potential UTI, presyncopal episode along with nausea and vomiting prompting thought of either Pyelonephritis/obstructive jaundice/advanced cervical cancer with metastases as CT scan of abdomen 05/23/13 = interval development of omental and notes concerning for new metastatic involvement with slight progression of hepatoduodenal ligament and retroperitoneum lymphadenopathy On admission had low blood pressures 90s over 60s ,Sodium 128, BUN/creatinine 8/0.5, alkaline phosphatase 874, albumin 1.6, hemoglobin 9, HCT 26, alkaline phosphatase 874 , total bilirubin 2.4, lactic acid 1.8, point-of-care troponin 0.3 Given consideration for urinary tract infection empirically Rocephin and vancomycin   Past medical history-As per Problem list Chart reviewed as below-  Consultants:   interventional radiology  Oncology   Procedures: acute abdominal series    acute abdominal series 06/02/13  Chest x-ray 06/02/48   Antibiotics:   Rocephin 06/02/13 -06/03/13   Zosyn 06/03/13   Vancomycin 06/02/13-06/03/13   Micafungin 06/04/13   Subjective   Still having R sided abd pain, improved from yesterday    Objective    Interim History:  reviewed  Telemetry:  atrial fibrillation rates 100-150   Objective: Filed Vitals:   06/08/13 0953 06/08/13 1445 06/08/13 2233 06/09/13 0449  BP: 110/70 110/76 104/73 96/66  Pulse: 96 93 113 114  Temp:  97.7 F (36.5 C) 98.2 F (36.8 C) 97.9 F (36.6 C)  TempSrc:  Oral Oral  Oral  Resp:  $Remo'18 20 16  'Abcld$ Height:      Weight:      SpO2:  93% 96% 97%    Intake/Output Summary (Last 24 hours) at 06/09/13 1133 Last data filed at 06/09/13 3244  Gross per 24 hour  Intake 257.98 ml  Output   1110 ml  Net -852.02 ml    Exam:  General:  alert, tired-appearing pleasant Caucasian female  Cardiovascular:  Tachycardic, S1-S2 atrial fibrillation noted Respiratory: Clinically clear no added sounds Abdomen: Nondistended however multiple drains in place, right upper quadrant mild tenderness on palpation, Diminished BS Skin no lower extremity edema but some lymphedema of the left lower extremity which is chronic Neuro intact  Data Reviewed: Basic Metabolic Panel:  Recent Labs Lab 06/03/13 0354 06/04/13 0445 06/05/13 0454 06/06/13 0338 06/07/13 0402  NA 128* 129* 127* 127* 128*  K 4.0 4.7 4.5 4.4 4.3  CL 95* 97 95* 93* 92*  CO2 $Re'23 22 23 24 23  'ygc$ GLUCOSE 86 81 86 110* 86  BUN $Re'8 7 6 'BqI$ 5* 6  CREATININE 0.59 0.68 0.63 0.59 0.58  CALCIUM 9.5 9.5 9.8 9.7 10.6*   Liver Function Tests:  Recent Labs Lab 06/02/13 1645 06/03/13 0354 06/04/13 0445 06/05/13 0454 06/06/13 0338  AST $Re'28 28 19 15 14  'OxY$ ALT $R'16 16 15 12 9  'YN$ ALKPHOS 918* 874* 734* 642* 621*  BILITOT 2.6* 2.4* 2.3* 2.3* 1.9*  PROT 7.5 6.0 5.8* 5.7* 5.6*  ALBUMIN 2.0* 1.6* 1.5* 1.5* 1.5*    Recent Labs Lab 06/02/13 1645  LIPASE 8*   No results found for this basename: AMMONIA,  in the last 168 hours CBC:  Recent  Labs Lab 06/02/13 1645  06/05/13 0454 06/06/13 0338 06/07/13 0402 06/08/13 0402 06/09/13 0448  WBC 10.0  < > 8.9 9.2 11.2* 9.6 10.0  NEUTROABS 8.2*  --   --   --   --   --   --   HGB 10.4*  < > 9.8* 10.4* 10.6* 9.9* 9.5*  HCT 32.3*  < > 29.6* 30.9* 33.3* 30.4* 30.7*  MCV 84.8  < > 83.6 83.1 84.3 83.3 85.5  PLT 369  < > 299 309 348 340 290  < > = values in this interval not displayed. Cardiac Enzymes:  Recent Labs Lab 06/02/13 1645  TROPONINI <0.30   BNP: No components found  with this basename: POCBNP,  CBG: No results found for this basename: GLUCAP,  in the last 168 hours  Recent Results (from the past 240 hour(s))  URINE CULTURE     Status: None   Collection Time    06/02/13  5:11 PM      Result Value Ref Range Status   Specimen Description URINE, CATHETERIZED   Final   Special Requests NONE   Final   Culture  Setup Time     Final   Value: 06/02/2013 21:57     Performed at [facility] Count     Final   Value: NO GROWTH     Performed at [facility]   Culture     Final   Value: NO GROWTH     Performed at [facility]   Report Status 06/03/2013 FINAL   Final  CULTURE, BLOOD (ROUTINE X 2)     Status: None   Collection Time    06/03/13 10:55 AM      Result Value Ref Range Status   Specimen Description BLOOD LEFT ARM  1 ML IN AEROBIC ONLY   Final   Special Requests Immunocompromised   Final   Culture  Setup Time     Final   Value: 06/03/2013 18:35     Performed at [facility]   Culture     Final   Value:        BLOOD CULTURE RECEIVED NO GROWTH TO DATE CULTURE WILL BE HELD FOR 5 DAYS BEFORE ISSUING A FINAL NEGATIVE REPORT     Performed at [facility]   Report Status PENDING   Incomplete  CULTURE, BLOOD (ROUTINE X 2)     Status: None   Collection Time    06/03/13 11:19 AM      Result Value Ref Range Status   Specimen Description BLOOD LEFT ARM  2 ML IN [facility] BOTTLE   Final   Special Requests Immunocompromised   Final   Culture  Setup Time     Final   Value: 06/03/2013 18:35     Performed at [facility]   Culture     Final   Value:        BLOOD CULTURE RECEIVED NO GROWTH TO DATE CULTURE WILL BE HELD FOR 5 DAYS BEFORE ISSUING A FINAL NEGATIVE REPORT     Performed at [facility]   Report Status PENDING   Incomplete     Studies:              All Imaging reviewed and is as per above notation   Scheduled Meds: . ciprofloxacin  500 mg Oral BID  . diltiazem  120 mg Oral Daily    . fentaNYL  200 mcg Transdermal Q72H  .  fluconazole  400 mg Oral Daily  . folic acid  1 mg Oral q morning - 10a  . loratadine  10 mg Oral Daily  . metoprolol  25 mg Oral BID  . metroNIDAZOLE  500 mg Oral 3 times per day  . milk and molasses  1 enema Rectal Once  . pantoprazole  40 mg Oral Daily  . polyethylene glycol  17 g Oral BID  . senna-docusate  1 tablet Oral BID  . simethicone  80 mg Oral QID  . sodium chloride  1 g Oral BID WC   Continuous Infusions: . HYDROmorphone 0.5 mg/hr (06/08/13 1238)    Assessment/Plan: 1. Fever/Sepsis    - Potential cholangitis vs. Malignancy related fever vs another abd source for fever    - LFTS/ALP trending down    -hold off on [place] Abd, since unlikely to change management    -biliary drains working reasonably well    -was on Cefepime, now Cipro/Flagyl and Fluconazole per ID recs for 4 more days  Blood cultures x2 negative,  urine culture negative- given negative Blood cultures will hold off on Port removal    -s/p palliative meeting, plan for home with hospice, comfort care  2. Anemia of malignancy     - transfused 1 u prbc this admission. Anemia panel confirms AOCD/AoMalignancy  3. Presyncopal episode       -due to 1/2   4. Hyponatremia      -improved with IVF  5. Atrial fibrillation with rapid ventricular response-       -HR still fluctuating, continue metoprolol/diltiazem, watch closely for bradycardia, HR dropped to 40s 2 days ago, now consistently up, increase dilatizem       -may have a component of tachybrady syndrome but not candidate for Pacer  6. Metastatic cervical cancer-was supposed to start new Chemo regimen, Topotecan-CT scan from 3/25 showed spread to the omentum and slight progression of hepatoduodenal ligament lymphadenopathy, oncology following, poor prognosis, Palliative consulted for goals of Care, meeting today, patient leaning towards home with hospice         -Pain of malignancy, continue fentanyl patch, dilaudid PO  and IV PRN      -agreeable to try low dose Dilaudid gtt now, start 0.$RemoveBefo'5mg'uqnzOMHGaal$ /hr  7. Hypokalemia potassium has been replaced   8. Adult failure to thrive-as above  9. 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 Constipation     -chronic worsened by narcotics     -add miralax BID, continue senokot, give Enema  Code Status: Full  Family Communication: discussed with patient at bedside Disposition Plan: home with hospice soon   Domenic Polite, MD  [facility] Pager [phone] 06/09/2013, 11:33 AM    LOS: 7 days

## 2013-06-10 LAB — CBC
HCT: 29.6 % — ABNORMAL LOW (ref 36.0–46.0)
Hemoglobin: 9.4 g/dL — ABNORMAL LOW (ref 12.0–15.0)
MCH: 27.2 pg (ref 26.0–34.0)
MCHC: 31.8 g/dL (ref 30.0–36.0)
MCV: 85.8 fL (ref 78.0–100.0)
PLATELETS: 297 10*3/uL (ref 150–400)
RBC: 3.45 MIL/uL — AB (ref 3.87–5.11)
RDW: 16.4 % — ABNORMAL HIGH (ref 11.5–15.5)
WBC: 11.3 10*3/uL — ABNORMAL HIGH (ref 4.0–10.5)

## 2013-06-10 MED ORDER — MORPHINE SULFATE (CONCENTRATE) 10 MG /0.5 ML PO SOLN
5.0000 mg | ORAL | Status: DC | PRN
  Administered 2013-06-10: 5 mg via SUBLINGUAL
  Administered 2013-06-10 (×3): 10 mg via SUBLINGUAL
  Administered 2013-06-11 (×3): 5 mg via SUBLINGUAL
  Administered 2013-06-11: 10 mg via SUBLINGUAL
  Filled 2013-06-10 (×8): qty 0.5

## 2013-06-10 NOTE — Progress Notes (Addendum)
[name] ZOX:[id number] DOB: 1950/11/25 DOA: 06/02/2013 PCP: Gwendolyn Grant, MD  Brief narrative:  63 y/o ? 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 Htn recent Hospital stay 05/04/13-->05/15/13 Asc.Cholangitis with Serratia and C [place] bacteremia completing Cipro/Fluconazole 3/24 was  re-admitted to [facility] c Potential UTI, presyncopal episode along with nausea and vomiting prompting thought of either Pyelonephritis/obstructive jaundice/advanced cervical cancer with metastases as CT scan of abdomen 05/23/13 = interval development of omental and notes concerning for new metastatic involvement with slight progression of hepatoduodenal ligament and retroperitoneum lymphadenopathy On admission had low blood pressures 90s over 60s ,Sodium 128, BUN/creatinine 8/0.5, alkaline phosphatase 874, albumin 1.6, hemoglobin 9, HCT 26, alkaline phosphatase 874 , total bilirubin 2.4, lactic acid 1.8, point-of-care troponin 0.3 Given consideration for urinary tract infection empirically Rocephin and vancomycin   Past medical history-As per Problem list Chart reviewed as below-  Consultants:   interventional radiology  Oncology   Procedures: acute abdominal series    acute abdominal series 06/02/13  Chest x-ray 06/02/48   Antibiotics:   Rocephin 06/02/13 -06/03/13   Zosyn 06/03/13   Vancomycin 06/02/13-06/03/13   Micafungin 06/04/13   Subjective   She is going back and forth with her pain regimen, had BMs after enema yesterday, extremely afraid of going home and dying    Objective    Interim History:  reviewed  Telemetry:  atrial fibrillation rates 100-150   Objective: Filed Vitals:   06/09/13 1404 06/09/13 2050 06/10/13 0636 06/10/13 1500  BP: 108/73 112/81 115/75 108/66  Pulse: 100 100 113 86  Temp: 97.7 F  (36.5 C) 97.4 F (36.3 C) 97.6 F (36.4 C) 98.1 F (36.7 C)  TempSrc: Oral Oral Oral Oral  Resp: _0 Height:      Weight:      SpO2: 98% 98% 98% 100%    Intake/Output Summary (Last 24 hours) at 06/10/13 1632 Last data filed at 06/10/13 1447  Gross per 24 hour  Intake 368.28 ml  Output    907 ml  Net -538.72 ml    Exam:  General:  alert, tired-appearing pleasant Caucasian female  Cardiovascular:  Tachycardic, S1-S2 atrial fibrillation noted Respiratory: Clinically clear no added sounds Abdomen: Nondistended however multiple drains in place, right upper quadrant mild tenderness on palpation, Diminished BS Ext:  lower extremity edema but some lymphedema of the left lower extremity which is chronic, increase swelling of L leg Neuro intact  Data Reviewed: Basic Metabolic Panel:  Recent Labs Lab 06/04/13 0445 06/05/13 0454 06/06/13 0338 06/07/13 0402  NA 129* 127* 127* 128*  K 4.7 4.5 4.4 4.3  CL 97 95* 93* 92*  CO2 _1 GLUCOSE 81 86 110* 86  BUN 7 6 5* 6  CREATININE 0.68 0.63 0.59 0.58  CALCIUM 9.5 9.8 9.7 10.6*   Liver Function Tests:  Recent Labs Lab 06/04/13 0445 06/05/13 0454 06/06/13 0338  AST _2 ALT _3 ALKPHOS 734* 642* 621*  BILITOT 2.3* 2.3* 1.9*  PROT 5.8* 5.7* 5.6*  ALBUMIN 1.5* 1.5* 1.5*   No results found for this basename: LIPASE, AMYLASE,  in the last 168 hours No results found for this basename: AMMONIA,  in the last 168 hours CBC:  Recent Labs  Lab 06/06/13 0338 06/07/13 0402 06/08/13 0402 06/09/13 0448 06/10/13 0517  WBC 9.2 11.2* 9.6 10.0 11.3*  HGB 10.4* 10.6* 9.9* 9.5* 9.4*  HCT 30.9* 33.3* 30.4* 30.7* 29.6*  MCV 83.1 84.3 83.3 85.5 85.8  PLT 309 348 340 290 297   Cardiac Enzymes: No results found for this basename: CKTOTAL, CKMB, CKMBINDEX, TROPONINI,  in the last 168 hours BNP: No components found with this basename: POCBNP,  CBG: No results found for this basename: GLUCAP,  in the last  168 hours  Recent Results (from the past 240 hour(s))  URINE CULTURE     Status: None   Collection Time    06/02/13  5:11 PM      Result Value Ref Range Status   Specimen Description URINE, CATHETERIZED   Final   Special Requests NONE   Final   Culture  Setup Time     Final   Value: 06/02/2013 21:57     Performed at [facility]   Colony Count     Final   Value: NO GROWTH     Performed at [facility]   Culture     Final   Value: NO GROWTH     Performed at [facility]   Report Status 06/03/2013 FINAL   Final  CULTURE, BLOOD (ROUTINE X 2)     Status: None   Collection Time    06/03/13 10:55 AM      Result Value Ref Range Status   Specimen Description BLOOD LEFT ARM  1 ML IN AEROBIC ONLY   Final   Special Requests Immunocompromised   Final   Culture  Setup Time     Final   Value: 06/03/2013 18:35     Performed at [facility]   Culture     Final   Value: NO GROWTH 5 DAYS     Performed at [facility]   Report Status 06/09/2013 FINAL   Final  CULTURE, BLOOD (ROUTINE X 2)     Status: None   Collection Time    06/03/13 11:19 AM      Result Value Ref Range Status   Specimen Description BLOOD LEFT ARM  2 ML IN EACH BOTTLE   Final   Special Requests Immunocompromised   Final   Culture  Setup Time     Final   Value: 06/03/2013 18:35     Performed at [facility]   Culture     Final   Value: NO GROWTH 5 DAYS     Performed at [facility]   Report Status 06/09/2013 FINAL   Final     Studies:              All Imaging reviewed and is as per above notation   Scheduled Meds: . ciprofloxacin  500 mg Oral BID  . diltiazem  120 mg Oral Daily  . fentaNYL  200 mcg Transdermal Q72H  . fluconazole  400 mg Oral Daily  . folic acid  1 mg Oral q morning - 10a  . loratadine  10 mg Oral Daily  . metoprolol  25 mg Oral BID  . metroNIDAZOLE  500 mg Oral 3 times per day  . pantoprazole  40 mg Oral Daily  . polyethylene glycol  17  g Oral BID  . senna-docusate  1 tablet Oral BID  . simethicone  80 mg Oral QID  . sodium chloride  1 g Oral BID WC   Continuous   Infusions:    Assessment/Plan: 1. Fever/Sepsis    - Potential cholangitis vs. Malignancy related fever vs another abd source for fever    - LFTS/ALP trending down    -hold off on US Abd, since unlikely to change management    -biliary drains working reasonably well    -was on Cefepime, now Cipro/Flagyl and Fluconazole per ID recs for 4 more days  Blood cultures x2 negative,  urine culture negative- given negative Blood cultures will hold off on Port removal    -s/p palliative meeting, plan for home with hospice, comfort care    -crying afraid of going home, Hospice unsure of providing IV dilaudid infusion at home hence compounding her fears and that of family, we decided to try PO roxanol today and DC home on this tomorrow if pain better controlled  2. Anemia of malignancy     - transfused 1 u prbc this admission. Anemia panel confirms AOCD/AoMalignancy  3. Presyncopal episode       -due to 1/2   4. Hyponatremia      -improved with IVF  5. Atrial fibrillation with rapid ventricular response-       -HR still fluctuating, continue metoprolol/diltiazem, watch closely for bradycardia, HR dropped to 40s 2 days ago, now consistently up, increase dilatizem       -may have a component of tachybrady syndrome but not candidate for Pacer  6. Metastatic cervical cancer-was supposed to start new Chemo regimen, Topotecan-CT scan from 3/25 showed spread to the omentum and slight progression of hepatoduodenal ligament lymphadenopathy, oncology following, poor prognosis, Palliative consulted for goals of Care, meeting today, patient leaning towards home with hospice         -Pain of malignancy, continue fentanyl patch, dilaudid PO and IV PRN      --crying afraid of going home, Hospice unsure of providing IV dilaudid infusion at home hence compounding her fears and that of  family, we decided to try PO roxanol today and DC home on this tomorrow if pain better controlled      -Stop Dilaudid infusion  7. Hypokalemia potassium has been replaced   8. Adult failure to thrive-as above  9. 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 Constipation     -chronic worsened by narcotics     -improved with enema, continue miralax BID, senokot  Code Status: Full  Family Communication: discussed with patient at bedside Disposition Plan: home with hospice soon   Preetha Joseph, MD  [facility] Pager [phone] 06/10/2013, 4:32 PM    LOS: 8 days      

## 2013-06-10 NOTE — Progress Notes (Signed)
Had lengthy conversation with pt about going home with Hospice.  She stated she is so scared of the transition and is not ready to die yet.  We discussed pain control and her control of her care.  She was very tearful, wanting more time for her son to adjust.  She is willing to try roxanol for pain in place of dilaudid drip.  Dr Broadus John aware, see new orders, and pt will be reassessed Monday for pain control and possible d/c to Hospice.  Misty with Hospice is aware of this plan.

## 2013-06-10 NOTE — Progress Notes (Signed)
Pt has had 3 doses of roxanol since dilaudid gtt stopped.  Pt states pain seems to be controlled so far.  Friends/family visiting

## 2013-06-11 ENCOUNTER — Telehealth: Payer: Self-pay

## 2013-06-11 LAB — CBC
HEMATOCRIT: 28.9 % — AB (ref 36.0–46.0)
HEMOGLOBIN: 9.2 g/dL — AB (ref 12.0–15.0)
MCH: 27.5 pg (ref 26.0–34.0)
MCHC: 31.8 g/dL (ref 30.0–36.0)
MCV: 86.5 fL (ref 78.0–100.0)
Platelets: 285 10*3/uL (ref 150–400)
RBC: 3.34 MIL/uL — ABNORMAL LOW (ref 3.87–5.11)
RDW: 16.5 % — ABNORMAL HIGH (ref 11.5–15.5)
WBC: 11.7 10*3/uL — ABNORMAL HIGH (ref 4.0–10.5)

## 2013-06-11 MED ORDER — POLYETHYLENE GLYCOL 3350 17 G PO PACK: 17.0000 g | PACK | Freq: Two times a day (BID) | ORAL | Status: AC

## 2013-06-11 MED ORDER — LORAZEPAM 1 MG PO TABS: 1.0000 mg | ORAL_TABLET | ORAL | Status: AC | PRN

## 2013-06-11 MED ORDER — MORPHINE SULFATE (CONCENTRATE) 10 MG /0.5 ML PO SOLN: 5.0000 mg | ORAL | Status: AC | PRN

## 2013-06-11 MED ORDER — HYDROMORPHONE HCL 4 MG PO TABS: 4.0000 mg | ORAL_TABLET | ORAL | Status: AC | PRN

## 2013-06-11 NOTE — Telephone Encounter (Signed)
OK. Thanks. [place]

## 2013-06-11 NOTE — Progress Notes (Signed)
Clinical Social Work  Patient has been accepted to [facility]. RN has number to call report. MD aware and has completed DC summary. CSW faxed DC summary to [facility] who is agreeable to accept today. CSW met with patient, S.O, sister, and son at bedside. Patient aware of plans and desires [facility] to provide transportation. Patient is tearful about transfer to hospice but reports that quality of life has not been well since she has been hospitalized multiple times in the past.  CSW coordinated transportation via [employer]. Request #: [id number].  CSW is signing off but available if needed.  Sindy Messing, LCSW (Coverage for [employer])

## 2013-06-11 NOTE — Progress Notes (Signed)
[facility] progress note. Chief complaint. Left leg pain and swelling. History of present illness. This 63 year old female in hospital with fever and sepsis. Patient has a history of prior DVT 11/19/10 and 05/19/11. Patient is status post IVC filter placement. Restarted on Xarelto/6/15 of her oncology recommends discontinuance of this given risk of bleeding from known liver disease. Staff is notify me of increased left lower extremity pain and swelling. I came to the bedside to further evaluate the patient. I find her alert and in no distress. Vital signs. Temperature 97.7, pulse 65, respirations 16, blood pressure 98/65. O2 sats 99%. General appearance. Well-developed elderly female is alert and in no distress. Extremities. Left lower extremity with significant nonpitting edema from foot to groin. There is calf pain and Homans appears positive. The foot is warm to touch with normal capillary refill. Impression/plan. Problem #1. 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 I have placed an order for it left lower extremity venous Doppler study to rule out recurrent DVT though clinically this already appears to be the case. Patient status post IVC filter. We will follow for results but, unsure clinically  if positive findings will significantly changed treatment plan.

## 2013-06-11 NOTE — Progress Notes (Signed)
50cc IV dilaudid wasted, verified by Micheal Likens.

## 2013-06-11 NOTE — Discharge Summary (Addendum)
Physician Discharge Summary  [name] WUJ:[id number] DOB: Apr 01, 1950 DOA: 06/02/2013  PCP: Gwendolyn Grant, MD  Admit date: 06/02/2013 Discharge date: 06/11/2013  Time spent:45 minutes  Recommendations for Outpatient Follow-up:  [facility]  Discharge Diagnoses:    Sepsis   Nausea and VOmiting   Abdominal pain   Atrial fibrillation   Metastatic Cervical cancer   Anemia of chronic disease   Alkaline phosphatase elevation   Malignant obstructive jaundice   Fever and chills   Hyponatremia   Syncope and collapse   Leg edema, left   Orthostatic hypotension   UTI (urinary tract infection)   Palliative care encounter   Cancer associated pain   Weakness generalized   H/o DVT   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 known h/o Met Cervical Ca 2005 s/p Hysterectomy + Mets--> Liver 2010, H/o Biliary obstruction + external stent the 04/09/13 left side and 2/27 on the right  Chronic Afib on Coumadin, prior DVT LLE 10/2010 + 3/13 c IVC 3/13 Htn recent Hospital stay 05/04/13-->05/15/13 Asc.Cholangitis with Serratia and C [place] bacteremia completing Cipro/Fluconazole 3/24 was  re-admitted to [facility] c Potential UTI, presyncopal episode along with nausea and vomiting prompting thought of either Pyelonephritis/obstructive jaundice/advanced cervical cancer with metastases as CT scan of abdomen 05/23/13 = interval development of omental and notes concerning for new metastatic involvement with slight progression of hepatoduodenal ligament and retroperitoneum lymphadenopathy  On admission had low blood pressures 90s over 60s ,Sodium 128, BUN/creatinine 8/0.5, alkaline phosphatase 874, albumin 1.6, hemoglobin 9, HCT 26, alkaline phosphatase 874 ,  total bilirubin 2.4, lactic acid 1.8  Hospital Course:  1. Fever/Sepsis - Potential cholangitis vs. Malignancy related fever vs another abd source for fever  - LFTS/ALP trending down  -hold off on [place] Abd, since unlikely to change management  -biliary drains working reasonably well  -was on Cefepime, now Cipro/Flagyl and Fluconazole per ID recs for 4 more days Blood cultures x2 negative, urine culture negative- given negative Blood cultures will hold off on Port removal  -s/p palliative meeting, plan for home with hospice, comfort care  -crying afraid of going home, Hospice unsure of providing IV dilaudid infusion at home hence compounding her fears and that of family, tried PO roxanol 4/12 and now plans for residential hospice -pain fairly controlled   2. Anemia of malignancy - transfused 1 u prbc this admission. Anemia panel confirms AOCD/AoMalignancy   3. Presyncopal episode -due to 1/2   4. Hyponatremia -improved with IVF   5. 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 Hypokalemia potassium has been replaced  8. Adult failure to thrive-as above  9. 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 Constipation  -chronic worsened by narcotics  -improved with enema, continue miralax BID, senokot    Consultations: Oncology Palliative medicine   Discharge Exam: Filed Vitals:   06/11/13 1033  BP: 111/85  Pulse: 110  Temp:   Resp:     General: AAOx3, frail, ill appearing Cardiovascular:S1S2/RRR Respiratory: CTAB  Discharge Instructions You were cared for by a hospitalist during your hospital stay. If you have any questions about your discharge medications or the care you received while you were in the hospital after you are discharged, you can call the unit and asked to speak with the hospitalist on call if the hospitalist that took care of you is not available. Once you are discharged, your primary care physician will handle any further medical issues. Please note that NO REFILLS for any discharge medications will be authorized once you are discharged, as it is imperative that you return to your primary care physician (or establish a relationship with a primary care physician if you do not have one) for your aftercare needs so that they can reassess your need for medications and monitor your lab values.  Discharge Orders   Future Appointments Provider Department Dept Phone   06/12/2013 10:30 AM Carlyle Basques, MD [facility] [phone]   Future Orders Complete By Expires   Diet general  As directed    Diet general  As directed    Increase activity slowly  As directed    Increase activity slowly  As directed        Medication List    STOP taking these medications       ferrous fumarate 325 (106 FE) MG Tabs tablet  Commonly known as:  HEMOCYTE - 106 mg FE     potassium chloride 8 MEQ tablet  Commonly known as:  KLOR-CON     rivaroxaban 20 MG Tabs tablet  Commonly known as:  XARELTO      TAKE  these medications       bisacodyl 5 MG EC tablet  Commonly known as:  DULCOLAX  Take 5 mg by mouth daily as needed for constipation.     CARNATION INSTANT BREAKFAST PO  Take 1 Can by mouth 4 (four) times daily as needed (for meals).     cetirizine 10 MG tablet  Commonly known as:  ZYRTEC  Take 10 mg by mouth daily as needed for allergies.     ciprofloxacin 500 MG tablet  Commonly known as:  CIPRO  Take 1 tablet (500 mg total) by mouth 2 (two) times daily. For 5 days     diltiazem 300 MG 24 hr capsule  Commonly known as:  CARDIZEM CD  Take 1 capsule (300 mg total) by mouth daily. TAke 2-3 hours before of after Metoprolol     docusate sodium 100 MG capsule  Commonly known as:  COLACE  Take 1 capsule (100 mg total) by mouth 2 (two) times daily.     fentaNYL 100 MCG/HR  Commonly known as:  DURAGESIC - dosed mcg/hr  Apply 2 patches every 72 hours (total of 200 mcg)     fluconazole 200 MG tablet  Commonly known  as:  DIFLUCAN  Take 2 tablets (400 mg total) by mouth daily. For 5 days     folic acid 1 MG tablet  Commonly known as:  FOLVITE  Take 1 tablet (1 mg total) by mouth every morning.     HYDROmorphone 4 MG tablet  Commonly known as:  DILAUDID  Take 1 tablet (4 mg total) by mouth every 4 (four) hours as needed for severe pain.     ibuprofen 200 MG tablet  Commonly known as:  ADVIL,MOTRIN  Take 600 mg by mouth every 4 (four) hours as needed. Pain     lidocaine-prilocaine cream  Commonly known as:  EMLA  Apply 1 application topically daily as needed (for port access).     LORazepam 1 MG tablet  Commonly known as:  ATIVAN  Take 1 tablet (1 mg total) by mouth every 6 (six) hours as needed (for nausea (may put under tongue)).     metoprolol 50 MG tablet  Commonly known as:  LOPRESSOR  Take 1 tablet (50 mg total) by mouth 2 (two) times daily.     metroNIDAZOLE 250 MG tablet  Commonly known as:  FLAGYL  Take 1 tablet (250 mg total) by mouth 3 (three) times daily. For  5 days     morphine CONCENTRATE 10 mg / 0.5 ml concentrated solution  Place 0.25-0.5 mLs (5-10 mg total) under the tongue every 2 (two) hours as needed for moderate pain or severe pain.     ondansetron 8 MG tablet  Commonly known as:  ZOFRAN  Take 1 tablet (8 mg total) by mouth every 8 (eight) hours as needed for nausea.     pantoprazole 40 MG tablet  Commonly known as:  PROTONIX  Take 1 tablet (40 mg total) by mouth daily.     polyethylene glycol packet  Commonly known as:  MIRALAX / GLYCOLAX  Take 17 g by mouth 2 (two) times daily.     senna-docusate 8.6-50 MG per tablet  Commonly known as:  Senokot-S  Take 1 tablet by mouth 2 (two) times daily.     sodium chloride 0.65 % nasal spray  Commonly known as:  OCEAN  Place 1 spray into the nose daily as needed for congestion. Allergies     triamcinolone 55 MCG/ACT nasal inhaler  Commonly known as:  NASACORT  Place 2 sprays into the nose daily as needed (allergies or congestion).     zolpidem 5 MG tablet  Commonly known as:  AMBIEN  Take 1 tablet (5 mg total) by mouth at bedtime as needed for sleep.       Allergies  Allergen Reactions  . Codeine Other (See Comments)    Breathing issues  . Flecainide Acetate Other (See Comments)    Leg cramping   . Morphine Other (See Comments)     Hallucinations  . Propafenone Hcl Other (See Comments)    Leg cramps      The results of significant diagnostics from this hospitalization (including imaging, microbiology, ancillary and laboratory) are listed below for reference.    Significant Diagnostic Studies: Dg Chest 2 View  06/02/2013   CLINICAL DATA:  weakness and fever  EXAM: CHEST  2 VIEW  COMPARISON:  CT CHEST W/CM dated 05/23/2013; DG CHEST 2 VIEW dated 05/04/2013  FINDINGS: Low lung volumes. Small right pleural effusion. Aerated lungs are clear. Cardiac silhouette enlarged. Right-sided porta catheter with tip projected regions superior vena cava. No gross osseous abnormalities.   IMPRESSION: Small right pleural  effusion otherwise no evidence of focal infiltrates.   Electronically Signed   By: Margaree Mackintosh M.D.   On: 06/02/2013 17:07   Ct Chest W Contrast  05/23/2013   CLINICAL DATA:  Cervical cancer.  EXAM: CT CHEST, ABDOMEN, AND PELVIS WITH CONTRAST  TECHNIQUE: Multidetector CT imaging of the chest, abdomen and pelvis was performed following the standard protocol during bolus administration of intravenous contrast.  CONTRAST:  178m OMNIPAQUE IOHEXOL 300 MG/ML  SOLN  COMPARISON:  Abdomen and pelvis CT 05/05/2013. Chest CT from 04/05/2013.  FINDINGS: CT CHEST FINDINGS  The tip of the right-sided Port-A-Cath is positioned in the upper right atrium.  There is no axillary lymphadenopathy. No mediastinal or hilar lymphadenopathy. The heart is enlarged. No pericardial effusion. Bilateral small pleural effusions are evident.  Right middle and lower lobe subsegmental atelectasis is evident. Subsegmental atelectasis is noted in the left lower lobe and lingula. No focal airspace consolidation. No pulmonary edema. No parenchymal mass lesion.  Bone windows reveal no worrisome lytic or sclerotic osseous lesions.  CT ABDOMEN AND PELVIS FINDINGS  The large lesion involving the medial segment left liver measures 8.0 x 8.5 cm today compared to 7.4 x 6.5 cm when remeasured at the same level and in the same axes on the previous exam. The posterior right hepatic lobe lesion measured previously at 4.9 cm now measures 5.5 cm. 4.3 cm lesion in the medial dome of the liver, adjacent to the IVC, was 3.3 cm on the previous study.  Two percutaneous biliary drains are identified with the distal tip of each coiled in the duodenum lumen. The presence of pneumobilia is compatible with the presence of the drains.  Spleen is unremarkable. Contrast material in distal esophagus may be related to dysmotility or reflux. Stomach is unremarkable. Duodenum has normal imaging features. Gallbladder is surgically absent. The  adrenal glands are normal. Left kidney is markedly atrophic, as before. Right kidney shows a small interpolar cyst but is otherwise unremarkable.  IVC filter is evident and there is laminar flow of opacified blood in the IVC between the filter and the right atrium, accentuated by non perfusion of the atrophic left kidney.  3.1 x 1.4 cm necrotic lymph node is identified in the hepatoduodenal ligament. 13 mm short axis retrocaval lymph node measured on the previous study is now 14 mm in the same dimension.  Pancreas is unremarkable.  Imaging through the pelvis shows a trace amount of intraperitoneal free fluid. Bladder is decompressed. Uterus is surgically absent. There is no adnexal mass.  The left common femoral lymph node measured previously at 2.0 x 3.6 cm now measures 1.8 x 3.6 cm. Colon is unremarkable. Terminal ileum is normal. The appendix is not visualized, but there is no edema or inflammation in the region of the cecum.  Interval development of omental nodules is evident. 11 mm left omental nodule is seen on image 70. 17 mm omental nodule is seen towards the midline on image 62.  Bone windows reveal no worrisome lytic or sclerotic osseous lesions. Patient is status post vertebral augmentation at L1 and L3. Superior endplate compression deformity of the L2 and L4 vertebral bodies is stable.  IMPRESSION: Interval progression of metastatic disease in the liver.  No evidence for worsening biliary obstruction in this patient with percutaneous biliary drain placement.  Interval development of omental nodules concerning for new metastatic involvement. Slight progression of hepatoduodenal ligament and retroperitoneal lymphadenopathy.  No substantial change in the left common femoral lymph node.  Electronically Signed   By: Misty Stanley M.D.   On: 05/23/2013 16:06   Ct Abdomen Pelvis W Contrast  05/23/2013   CLINICAL DATA:  Cervical cancer.  EXAM: CT CHEST, ABDOMEN, AND PELVIS WITH CONTRAST  TECHNIQUE:  Multidetector CT imaging of the chest, abdomen and pelvis was performed following the standard protocol during bolus administration of intravenous contrast.  CONTRAST:  154m OMNIPAQUE IOHEXOL 300 MG/ML  SOLN  COMPARISON:  Abdomen and pelvis CT 05/05/2013. Chest CT from 04/05/2013.  FINDINGS: CT CHEST FINDINGS  The tip of the right-sided Port-A-Cath is positioned in the upper right atrium.  There is no axillary lymphadenopathy. No mediastinal or hilar lymphadenopathy. The heart is enlarged. No pericardial effusion. Bilateral small pleural effusions are evident.  Right middle and lower lobe subsegmental atelectasis is evident. Subsegmental atelectasis is noted in the left lower lobe and lingula. No focal airspace consolidation. No pulmonary edema. No parenchymal mass lesion.  Bone windows reveal no worrisome lytic or sclerotic osseous lesions.  CT ABDOMEN AND PELVIS FINDINGS  The large lesion involving the medial segment left liver measures 8.0 x 8.5 cm today compared to 7.4 x 6.5 cm when remeasured at the same level and in the same axes on the previous exam. The posterior right hepatic lobe lesion measured previously at 4.9 cm now measures 5.5 cm. 4.3 cm lesion in the medial dome of the liver, adjacent to the IVC, was 3.3 cm on the previous study.  Two percutaneous biliary drains are identified with the distal tip of each coiled in the duodenum lumen. The presence of pneumobilia is compatible with the presence of the drains.  Spleen is unremarkable. Contrast material in distal esophagus may be related to dysmotility or reflux. Stomach is unremarkable. Duodenum has normal imaging features. Gallbladder is surgically absent. The adrenal glands are normal. Left kidney is markedly atrophic, as before. Right kidney shows a small interpolar cyst but is otherwise unremarkable.  IVC filter is evident and there is laminar flow of opacified blood in the IVC between the filter and the right atrium, accentuated by non perfusion  of the atrophic left kidney.  3.1 x 1.4 cm necrotic lymph node is identified in the hepatoduodenal ligament. 13 mm short axis retrocaval lymph node measured on the previous study is now 14 mm in the same dimension.  Pancreas is unremarkable.  Imaging through the pelvis shows a trace amount of intraperitoneal free fluid. Bladder is decompressed. Uterus is surgically absent. There is no adnexal mass.  The left common femoral lymph node measured previously at 2.0 x 3.6 cm now measures 1.8 x 3.6 cm. Colon is unremarkable. Terminal ileum is normal. The appendix is not visualized, but there is no edema or inflammation in the region of the cecum.  Interval development of omental nodules is evident. 11 mm left omental nodule is seen on image 70. 17 mm omental nodule is seen towards the midline on image 62.  Bone windows reveal no worrisome lytic or sclerotic osseous lesions. Patient is status post vertebral augmentation at L1 and L3. Superior endplate compression deformity of the L2 and L4 vertebral bodies is stable.  IMPRESSION: Interval progression of metastatic disease in the liver.  No evidence for worsening biliary obstruction in this patient with percutaneous biliary drain placement.  Interval development of omental nodules concerning for new metastatic involvement. Slight progression of hepatoduodenal ligament and retroperitoneal lymphadenopathy.  No substantial change in the left common femoral lymph node.   Electronically Signed   By: ERandall Hiss  Tery Sanfilippo M.D.   On: 05/23/2013 16:06   Dg Abd 2 Views  06/02/2013   CLINICAL DATA:  Vomiting, fever and chills. Right-sided abdominal pain.  EXAM: ABDOMEN - 2 VIEW  COMPARISON:  Abdominal radiograph performed 11/23/2012, and CT of the abdomen and pelvis performed 05/23/2013  FINDINGS: The patient's two percutaneous biliary drainage catheters are again noted. The visualized bowel gas pattern is unremarkable. Scattered air and stool filled loops of colon are seen; no abnormal  dilatation of small bowel loops is seen to suggest small bowel obstruction. No free intra-abdominal air is identified, though evaluation for free air is limited on a single supine view. Scattered clips are noted in the right upper quadrant. A chest port is partially imaged.  Changes of prior vertebroplasty are noted at multiple levels along the lumbar spine; the sacroiliac joints are unremarkable in appearance. The visualized lung bases are essentially clear.  IMPRESSION: Unremarkable bowel gas pattern; no free intra-abdominal air seen. Percutaneous biliary drainage catheters are unchanged in position.   Electronically Signed   By: Garald Balding M.D.   On: 06/02/2013 21:16    Microbiology: Recent Results (from the past 240 hour(s))  URINE CULTURE     Status: None   Collection Time    06/02/13  5:11 PM      Result Value Ref Range Status   Specimen Description URINE, CATHETERIZED   Final   Special Requests NONE   Final   Culture  Setup Time     Final   Value: 06/02/2013 21:57     Performed at [facility]     Final   Value: NO GROWTH     Performed at [facility]   Culture     Final   Value: NO GROWTH     Performed at [facility]   Report Status 06/03/2013 FINAL   Final  CULTURE, BLOOD (ROUTINE X 2)     Status: None   Collection Time    06/03/13 10:55 AM      Result Value Ref Range Status   Specimen Description BLOOD LEFT ARM  1 ML IN AEROBIC ONLY   Final   Special Requests Immunocompromised   Final   Culture  Setup Time     Final   Value: 06/03/2013 18:35     Performed at [facility]   Culture     Final   Value: NO GROWTH 5 DAYS     Performed at [facility]   Report Status 06/09/2013 FINAL   Final  CULTURE, BLOOD (ROUTINE X 2)     Status: None   Collection Time    06/03/13 11:19 AM      Result Value Ref Range Status   Specimen Description BLOOD LEFT ARM  2 ML IN [facility] BOTTLE   Final   Special Requests Immunocompromised    Final   Culture  Setup Time     Final   Value: 06/03/2013 18:35     Performed at [facility]   Culture     Final   Value: NO GROWTH 5 DAYS     Performed at [facility]   Report Status 06/09/2013 FINAL   Final     Labs: Basic Metabolic Panel:  Recent Labs Lab 06/05/13 0454 06/06/13 0338 06/07/13 0402  NA 127* 127* 128*  K 4.5 4.4 4.3  CL 95* 93* 92*  CO2 _0 GLUCOSE 86 110* 86  BUN  6 5* 6  CREATININE 0.63 0.59 0.58  CALCIUM 9.8 9.7 10.6*   Liver Function Tests:  Recent Labs Lab 06/05/13 0454 06/06/13 0338  AST 15 14  ALT 12 9  ALKPHOS 642* 621*  BILITOT 2.3* 1.9*  PROT 5.7* 5.6*  ALBUMIN 1.5* 1.5*   No results found for this basename: LIPASE, AMYLASE,  in the last 168 hours No results found for this basename: AMMONIA,  in the last 168 hours CBC:  Recent Labs Lab 06/07/13 0402 06/08/13 0402 06/09/13 0448 06/10/13 0517 06/11/13 0400  WBC 11.2* 9.6 10.0 11.3* 11.7*  HGB 10.6* 9.9* 9.5* 9.4* 9.2*  HCT 33.3* 30.4* 30.7* 29.6* 28.9*  MCV 84.3 83.3 85.5 85.8 86.5  PLT 348 340 290 297 285   Cardiac Enzymes: No results found for this basename: CKTOTAL, CKMB, CKMBINDEX, TROPONINI,  in the last 168 hours BNP: BNP (last 3 results) No results found for this basename: PROBNP,  in the last 8760 hours CBG: No results found for this basename: GLUCAP,  in the last 168 hours     Signed:  [facility] 06/11/2013, 10:50 AM

## 2013-06-11 NOTE — Progress Notes (Signed)
50cc IV Dilaudid wasted in sink.  Witnessed Glennie Hawk, Therapist, sports. Andre Lefort

## 2013-06-11 NOTE — Progress Notes (Signed)
Progress Note from the Palliative Medicine Team at [place]: patient is alert and oriented, life partner Buddy at bedside  -continued conversation regarding GOC, symptom management and disposition    Objective: Allergies  Allergen Reactions  . Codeine Other (See Comments)    Breathing issues  . Flecainide Acetate Other (See Comments)    Leg cramping   . Morphine Other (See Comments)     Hallucinations  . Propafenone Hcl Other (See Comments)    Leg cramps   Scheduled Meds: . ciprofloxacin  500 mg Oral BID  . diltiazem  120 mg Oral Daily  . fentaNYL  200 mcg Transdermal Q72H  . fluconazole  400 mg Oral Daily  . folic acid  1 mg Oral q morning - 10a  . loratadine  10 mg Oral Daily  . metoprolol  25 mg Oral BID  . metroNIDAZOLE  500 mg Oral 3 times per day  . pantoprazole  40 mg Oral Daily  . polyethylene glycol  17 g Oral BID  . senna-docusate  1 tablet Oral BID  . simethicone  80 mg Oral QID  . sodium chloride  1 g Oral BID WC   Continuous Infusions:  PRN Meds:.acetaminophen, acetaminophen, albuterol, alum & mag hydroxide-simeth, bisacodyl, fluticasone, HYDROmorphone (DILAUDID) injection, lidocaine-prilocaine, LORazepam, morphine CONCENTRATE, ondansetron (ZOFRAN) IV, ondansetron, sodium chloride, zolpidem  BP 98/65  Pulse 5  Temp(Src) 97.7 F (36.5 C) (Oral)  Resp 16  Ht 5\' 1"  (1.549 m)  Wt 69.491 kg (153 lb 3.2 oz)  BMI 28.96 kg/m2  SpO2 99%   PPS:40 %    Intake/Output Summary (Last 24 hours) at 06/11/13 0942 Last data filed at 06/11/13 0300  Gross per 24 hour  Intake    360 ml  Output    855 ml  Net   -495 ml       Physical Exam:  General: chronically ill appearing, weak, NAD  HEENT: Moist membranes, no exudate noted  Chest: CTA  CVS: tachycardic rate 106, irregular  Abdomen: RUQ tenderness  Ext: Without edema  Neuro: Alert and oriented X3, fully engaged in conversation   Labs: CBC    Component Value Date/Time   WBC 11.7*  06/11/2013 0400   WBC 6.5 05/21/2013 1114   RBC 3.34* 06/11/2013 0400   RBC 3.50* 06/04/2013 1210   RBC 3.16* 05/21/2013 1114   HGB 9.2* 06/11/2013 0400   HGB 9.0* 05/21/2013 1114   HCT 28.9* 06/11/2013 0400   HCT 27.8* 05/21/2013 1114   PLT 285 06/11/2013 0400   PLT 310 05/21/2013 1114   MCV 86.5 06/11/2013 0400   MCV 88.0 05/21/2013 1114   MCH 27.5 06/11/2013 0400   MCH 28.5 05/21/2013 1114   MCHC 31.8 06/11/2013 0400   MCHC 32.4 05/21/2013 1114   RDW 16.5* 06/11/2013 0400   RDW 15.4* 05/21/2013 1114   LYMPHSABS 0.9 06/02/2013 1645   LYMPHSABS 0.9 05/21/2013 1114   MONOABS 0.9 06/02/2013 1645   MONOABS 0.8 05/21/2013 1114   EOSABS 0.0 06/02/2013 1645   EOSABS 0.1 05/21/2013 1114   BASOSABS 0.0 06/02/2013 1645   BASOSABS 0.0 05/21/2013 1114    BMET    Component Value Date/Time   NA 128* 06/07/2013 0402   NA 134* 05/21/2013 1115   K 4.3 06/07/2013 0402   K 3.8 05/21/2013 1115   CL 92* 06/07/2013 0402   CL 108* 08/15/2012 1252   CO2 23 06/07/2013 0402   CO2 21* 05/21/2013 1115   GLUCOSE 86 06/07/2013  0402   GLUCOSE 103 05/21/2013 1115   GLUCOSE 119* 08/15/2012 1252   BUN 6 06/07/2013 0402   BUN 18.2 05/21/2013 1115   CREATININE 0.58 06/07/2013 0402   CREATININE 0.9 05/21/2013 1115   CREATININE 1.45* 09/11/2010 1603   CALCIUM 10.6* 06/07/2013 0402   CALCIUM 9.1 05/21/2013 1115   GFRNONAA >90 06/07/2013 0402   GFRNONAA 37* 09/11/2010 1603   GFRAA >90 06/07/2013 0402   GFRAA 45* 09/11/2010 1603    CMP     Component Value Date/Time   NA 128* 06/07/2013 0402   NA 134* 05/21/2013 1115   K 4.3 06/07/2013 0402   K 3.8 05/21/2013 1115   CL 92* 06/07/2013 0402   CL 108* 08/15/2012 1252   CO2 23 06/07/2013 0402   CO2 21* 05/21/2013 1115   GLUCOSE 86 06/07/2013 0402   GLUCOSE 103 05/21/2013 1115   GLUCOSE 119* 08/15/2012 1252   BUN 6 06/07/2013 0402   BUN 18.2 05/21/2013 1115   CREATININE 0.58 06/07/2013 0402   CREATININE 0.9 05/21/2013 1115   CREATININE 1.45* 09/11/2010 1603   CALCIUM 10.6* 06/07/2013 0402   CALCIUM 9.1 05/21/2013 1115    PROT 5.6* 06/06/2013 0338   PROT 6.4 05/21/2013 1115   ALBUMIN 1.5* 06/06/2013 0338   ALBUMIN 1.8* 05/21/2013 1115   AST 14 06/06/2013 0338   AST 22 05/21/2013 1115   ALT 9 06/06/2013 0338   ALT 14 05/21/2013 1115   ALKPHOS 621* 06/06/2013 0338   ALKPHOS 665* 05/21/2013 1115   BILITOT 1.9* 06/06/2013 0338   BILITOT 2.11* 05/21/2013 1115   GFRNONAA >90 06/07/2013 0402   GFRNONAA 37* 09/11/2010 1603   GFRAA >90 06/07/2013 0402   GFRAA 45* 09/11/2010 1603     Assessment and Plan: 1. Code Status:DNR/DNI-comfort is main focus of care 2. Symptom Control: Anxiety/Agitation: Ativan 1 mg PO every 4 hrs prn Pain:  Patient wishes to continue to Roxanol as presently written.   Again discussed in detail the option of utilizing her [facility] a [facility] for continuous infusion. She is open to that concept in the future if she "needs it". Hopefull she will agree to transition to alternative pain management strategies once she is situated at an inpatient facility 3. Psycho/Social:  Emotional support offered to patient and her partner.  It is very difficult for both coming to grips with the limited prognosis 4. Spiritual  Chaplain consulted 5. Disposition:  Hopeful for an inpatient facility     Time In Time Out Total Time Spent with Patient Total Overall Time  1130 1205 35 min 35 min    Greater than 50%  of this time was spent counseling and coordinating care related to the above assessment and plan.  Wadie Lessen NP  Palliative Medicine Team Team Phone # [phone] Pager [phone]  Discussed with Dr Broadus John 1

## 2013-06-11 NOTE — Progress Notes (Signed)
PAtient discharged to [facility] via ambulance. Discharge packet prepared by CSW and given to EMT transporters for facility. Patient denies any distress during transportation. No wound noted, drain intact. Family at the bedside during transportation.[name]

## 2013-06-11 NOTE — Progress Notes (Signed)
Clinical Social Work Department BRIEF PSYCHOSOCIAL ASSESSMENT 06/11/2013  Patient:  [name],[name]     Account Number:  [id number]     Admit date:  06/02/2013  Clinical Social Worker:  GERBER,HOLLY, LCSW  Date/Time:  06/11/2013 10:30 AM  Referred by:  Physician  Date Referred:  06/11/2013 Referred for  Residential hospice placement   Other Referral:   Interview type:  Patient Other interview type:    PSYCHOSOCIAL DATA Living Status:  FAMILY Admitted from facility:   Level of care:   Primary support name:  Ernest Primary support relationship to patient:  PARTNER Degree of support available:   Strong    CURRENT CONCERNS Current Concerns  Post-Acute Placement   Other Concerns:    SOCIAL WORK ASSESSMENT / PLAN CSW received referral in order to offer hospice choice. CSW reviewed chart which stated that patient initially wanted to go home with hospice but now feels residential hospice would be better. CSW met with patient and significant other at bedside. CSW introduced myself and explained role.    Patient reports she and S.O. have been together for over 30 years and have a great relationship. Patient reports that she felt going home would be best but after talking with PMT she feels that she needs residential placement. CSW offered choice and patient prefers [facility]. CSW spoke with Michelle at [facility] who reports she will visit patient between 11am-12pm. Patient and S.O. aware of time and patient will contact son to determine if he wants to be involved in conversation.    CSW will continue to follow and assist with DC planning.   Assessment/plan status:  Psychosocial Support/Ongoing Assessment of Needs Other assessment/ plan:   Information/referral to community resources:   Hospice choice    PATIENT'S/FAMILY'S RESPONSE TO PLAN OF CARE: Patient alert and oriented. Patient tearful at times but reports good family support. Patient feels she is making the  best decision but wants to discuss hospice home in further detail with representative prior to making any final decisions. Patient reports that hospitalization has been difficult when trying to manage emotions and feelings re: situation. Patient thanked CSW for time and hopeful that discussion with hospice home will go well. Patient agreeable for CSW to continue to follow.       Holly Gerber, LCSW (Coverage for Kelly Foley) 

## 2013-06-11 NOTE — Telephone Encounter (Signed)
[place] [phone] LMOVM  regarding patient.  Order obtained Friday for home hospice, however patients pain is uncontrolled.  Patient will be going to the [facility] as inpatient - Dr. Jani Gravel will act as attending - until pain under control.  When patient calls, she will call again to discuss attending.

## 2013-06-12 ENCOUNTER — Ambulatory Visit: Payer: 59 | Admitting: Internal Medicine

## 2013-06-18 NOTE — Consult Note (Signed)
I have reviewed and discussed the care of this patient in detail with the nurse practitioner including pertinent patient records, physical exam findings and data. I agree with details of this encounter.  

## 2013-06-19 ENCOUNTER — Ambulatory Visit: Payer: 59 | Admitting: Adult Health

## 2013-06-19 ENCOUNTER — Other Ambulatory Visit: Payer: 59

## 2013-06-19 ENCOUNTER — Ambulatory Visit: Payer: 59

## 2013-06-26 ENCOUNTER — Other Ambulatory Visit: Payer: 59

## 2013-06-26 ENCOUNTER — Ambulatory Visit: Payer: 59

## 2013-06-26 ENCOUNTER — Ambulatory Visit: Payer: 59 | Admitting: Adult Health

## 2013-06-29 ENCOUNTER — Telehealth: Payer: Self-pay

## 2013-06-29 ENCOUNTER — Telehealth: Payer: Self-pay | Admitting: Internal Medicine

## 2013-06-29 NOTE — Telephone Encounter (Signed)
[facility] called to inform Dr. Asa Lente that the patient passed away last night 2013-07-20 at 11:45pm.

## 2013-06-29 NOTE — Telephone Encounter (Signed)
[name] called and left message that patient passed away on 2013-07-19 at 1145 pm.  Routed to [facility], [place], [employer], [employer].   POF sent.

## 2013-06-29 DEATH — deceased

## 2013-07-02 NOTE — Telephone Encounter (Signed)
Noted! Thank you

## 2013-07-03 ENCOUNTER — Ambulatory Visit: Payer: 59

## 2013-07-03 ENCOUNTER — Ambulatory Visit: Payer: 59 | Admitting: Adult Health

## 2013-07-03 ENCOUNTER — Other Ambulatory Visit: Payer: 59

## 2013-07-10 ENCOUNTER — Ambulatory Visit: Payer: 59 | Admitting: Adult Health

## 2013-07-10 ENCOUNTER — Other Ambulatory Visit: Payer: 59

## 2013-07-10 ENCOUNTER — Ambulatory Visit: Payer: 59

## 2013-10-23 ENCOUNTER — Other Ambulatory Visit: Payer: Self-pay | Admitting: Pharmacist

## 2014-02-20 ENCOUNTER — Telehealth: Payer: Self-pay

## 2014-02-20 NOTE — Telephone Encounter (Signed)
Mailed signed orders to close out deceased patient chart to Hospice in enclosed enevelope dated 02-19-14. Sent a copy to HIM to be scanned into the patient's EMR.

## 2014-03-21 ENCOUNTER — Telehealth: Payer: Self-pay

## 2014-03-21 NOTE — Telephone Encounter (Signed)
Mailed signed orders dated 03-21-14 to [facility].  Sent a copy to HIM to be scanned into patient's EMR.

## 2015-04-07 IMAGING — XA IR CHOLANGIOGRAM VIA EXIST CATHETER
3 series · 5 of 5 positions shown · non-contrast
Comparison: none

CLINICAL DATA: Metastatic cervical cancer with liver lesions and
biliary obstruction. Patient has an internal-external left biliary
drain.

[Series 3: care single · 1 of 1 slices shown (1 of 2)]
[im 1/1]
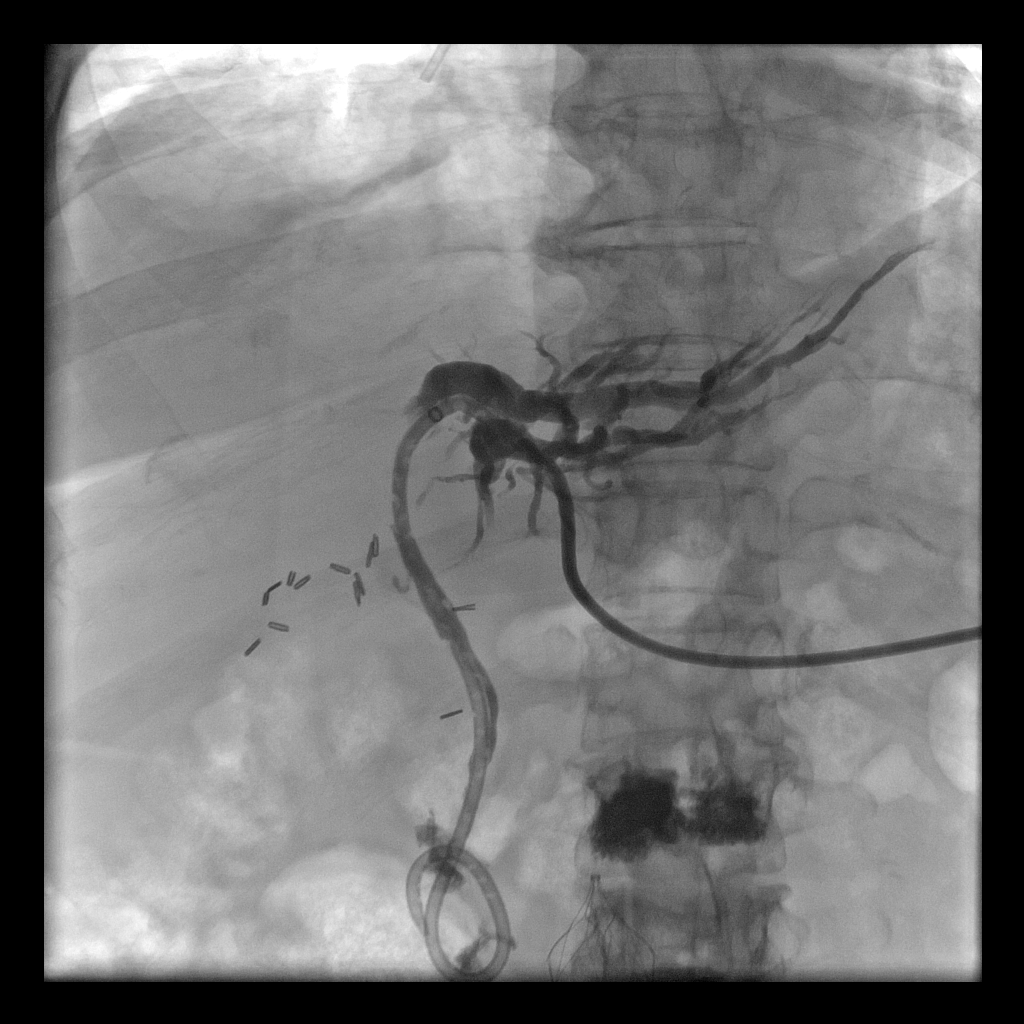

[Series 4: care single · 1 of 1 slices shown (2 of 2)]
[im 1/1]
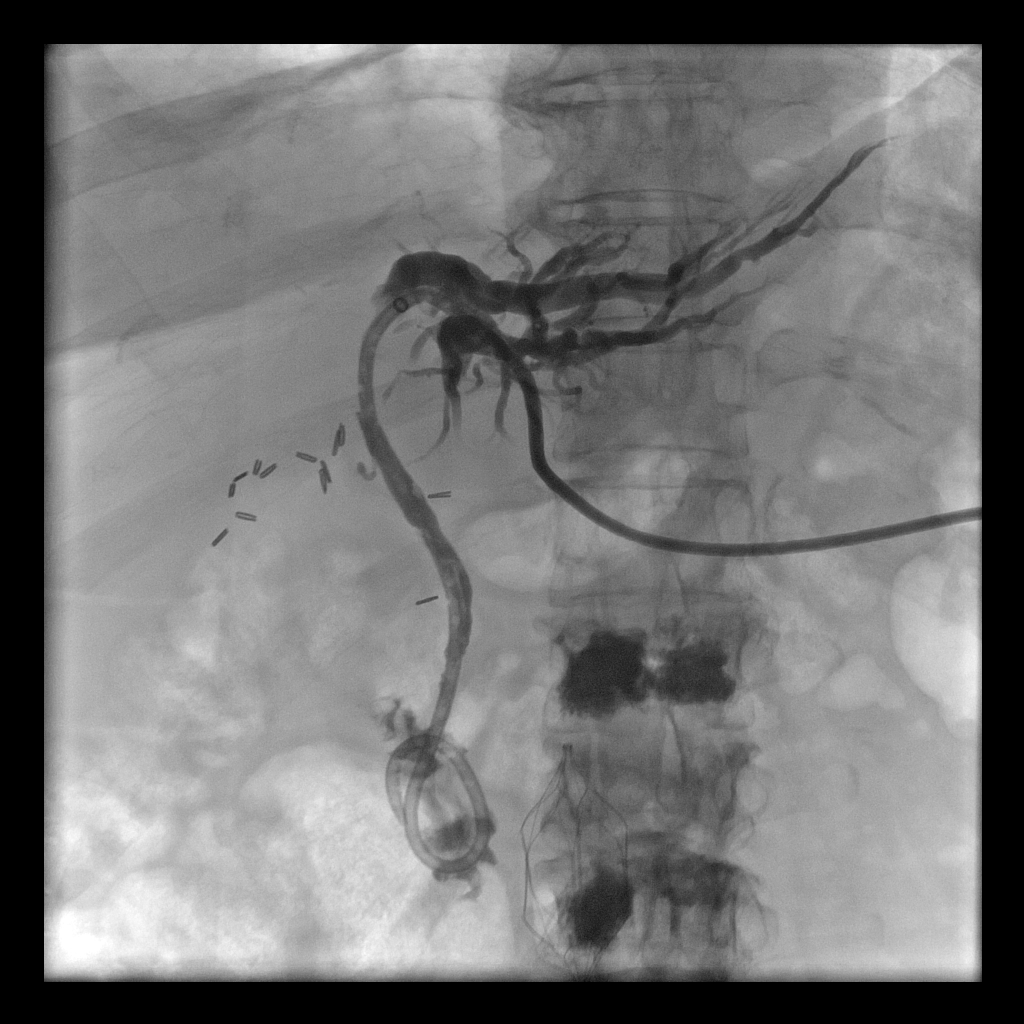

[Series 300: sp biliary duct stricture dil · 3 of 3 slices shown]
[im 1/3]
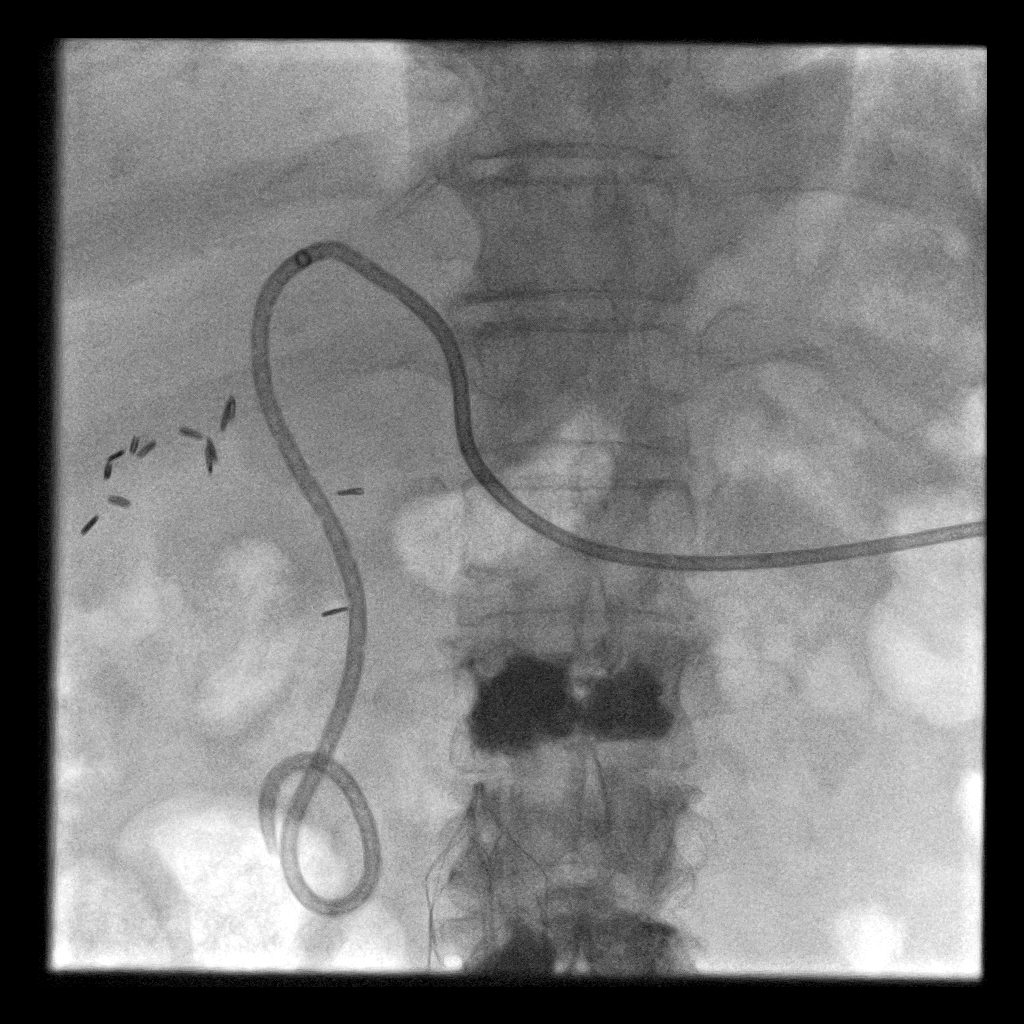
[im 2/3]
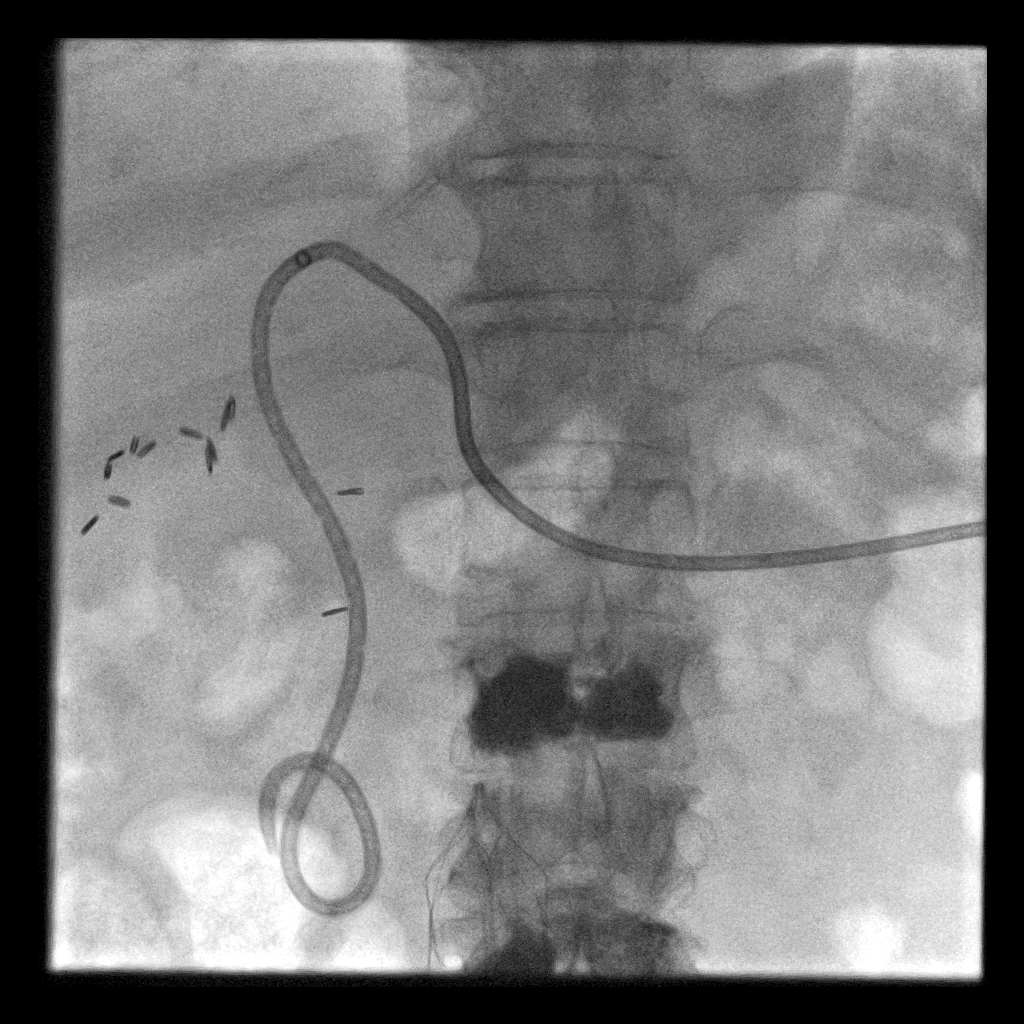
[im 3/3]
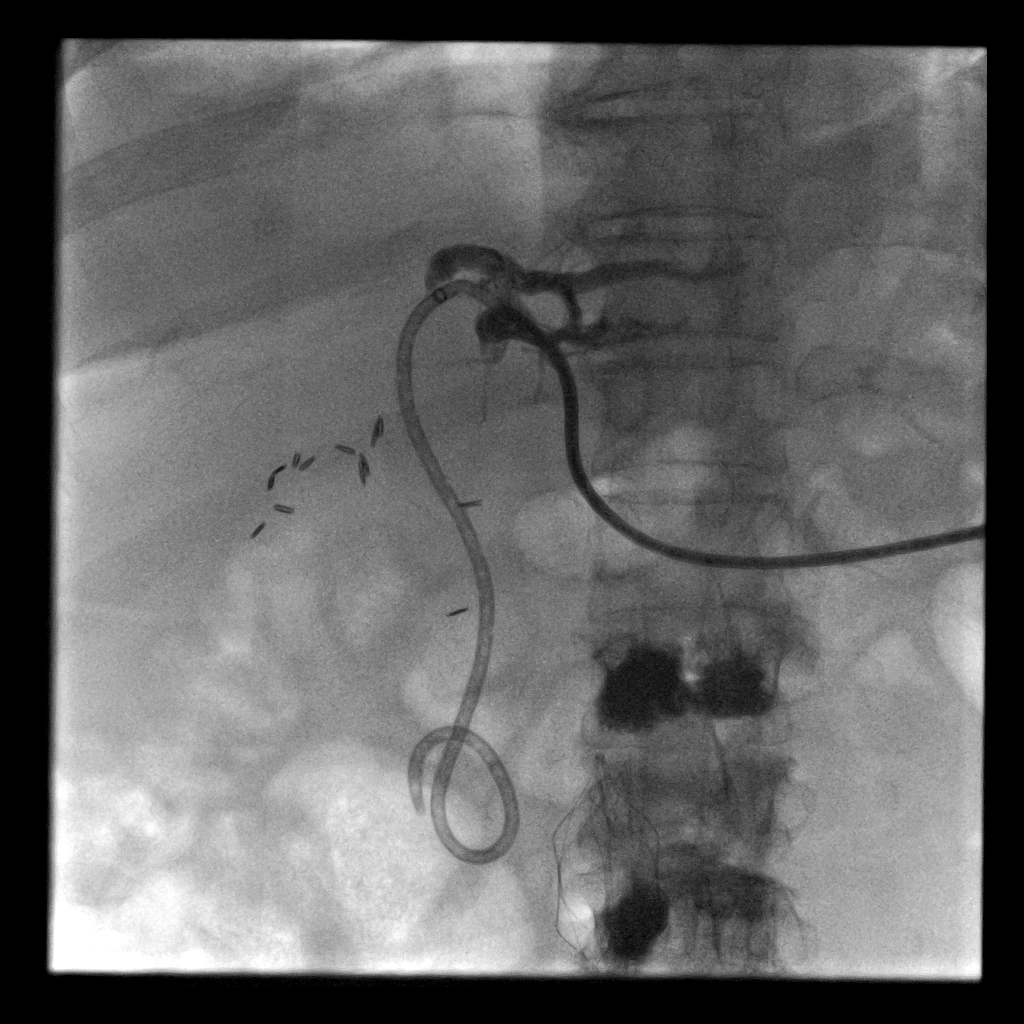

[5 of 5 positions shown; findings below may reference images not displayed]

EXAM:
CHOLANGIOGRAM VIA EXISTING CATHETER

MEDICATIONS:
None

ANESTHESIA/SEDATION:
Moderate sedation time: None

FLUOROSCOPY TIME:  48 seconds

PROCEDURE:
Patient was placed supine on the interventional table. The existing
left biliary drain was injected with contrast under fluoroscopy. The
drain was flushed with normal saline at the end of the procedure and
attached to a new gravity bag.

COMPLICATIONS:
None
FINDINGS: Stable position of the left internal-external biliary drain.
Injection of contrast demonstrates filling of mildly dilated left
intrahepatic bile ducts. Contrast drains around the catheter into
the small bowel. There is no filling of the right intrahepatic
ducts. Patient has surgical clips in the right upper quadrant from a
cholecystectomy. Patient has a TrapEase IVC filter and vertebral
augmentation at L1.
IMPRESSION: The biliary drain is only draining the left hepatic lobe. There is
no filling of the right bile ducts. These findings explain why the
patient's total bilirubin remains elevated. These findings were
discussed with the patient.

PLAN:
The patient will be scheduled for a right biliary tube placement
with ultrasound and fluoroscopy. Patient will be scheduled later in
the week because she is still on Xarelto. Plan to keep the patient
overnight for observation after the procedure.

## 2015-04-11 IMAGING — XA IR PTC
6 of 9 series · 11 of 17 positions shown · non-contrast
Comparison: CT ABD/PELVIS W CM dated 04/05/2013

INDICATION: History of metastatic cervical cancer, now with obstructive jaundice
due to hilar metastases. Post left-sided ultrasound and fluoroscopic
guided percutaneous biliary drainage catheter placement on
04/09/2013, though patient's bilirubin has remained elevated post
drain placement. Subsequent biliary catheter check performed
04/23/2013 demonstrates failure of communication between the left
and right biliary trees. As such, the request has been made for
placement of an additional right-sided biliary drainage catheter.

EXAM:
1. ULTRASOUND AND FLUOROSCOPIC GUIDED PERCUTANEOUS TRANSHEPATIC
CHOLANGIOGRAM AND BILIARY TUBE PLACEMENT
2. FLUOROSCOPIC GUIDED EXCHANGE OF LEFT-SIDED PERCUTANEOUS BILIARY
DRAINAGE CATHETER
MEDICATIONS:
Versed said 6 mg IV; fentanyl 300 mcg IV; Zosyn 3.375 mg IV
TECHNIQUE: Informed written consent was obtained from the patient after a
discussion of the risks, benefits and alternatives to treatment.
Questions regarding the procedure were encouraged and answered. A
timeout was performed prior to the initiation of the procedure.

[Series 1: care single · 1 of 1 slices shown (1 of 5)]
[im 1/1]
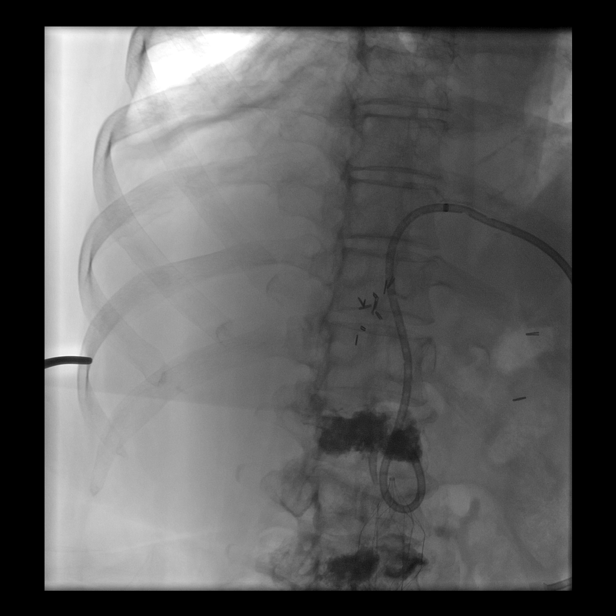

[Series 5: care single · 1 of 1 slices shown (2 of 5)]
[im 1/1]
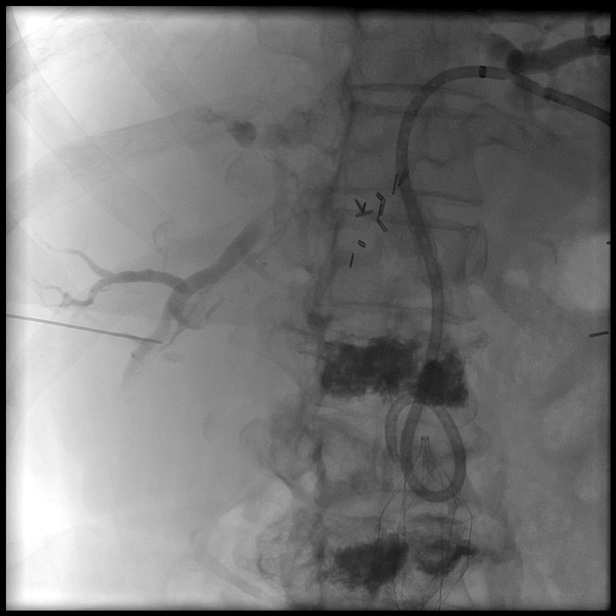

[Series 6: care single · 1 of 1 slices shown (3 of 5)]
[im 1/1]
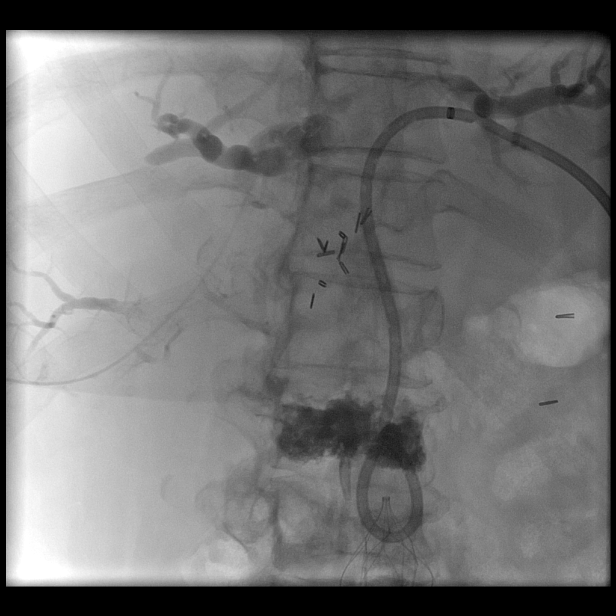

[Series 12: care single · 1 of 1 slices shown (4 of 5)]
[im 1/1]
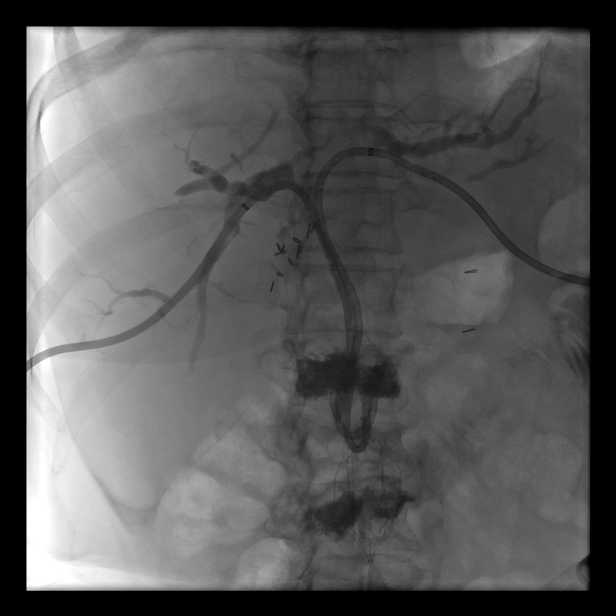

[Series 13: care single · 1 of 1 slices shown (5 of 5)]
[im 1/1]
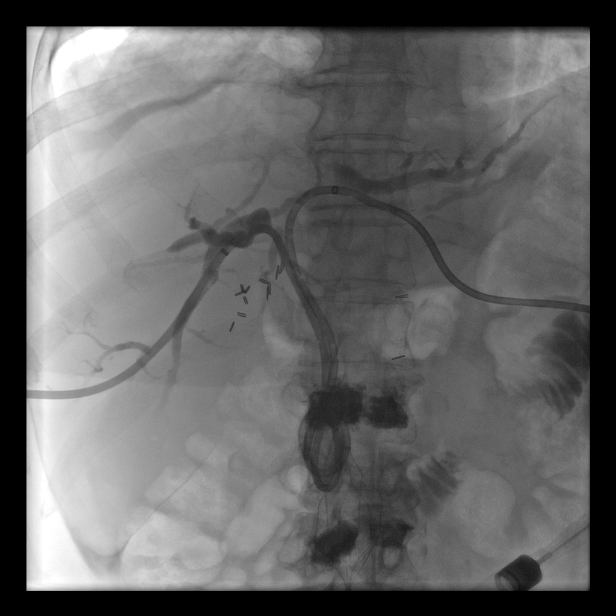

[Series 300: ir ptc · 6 of 9 slices shown]
[im 1/9]
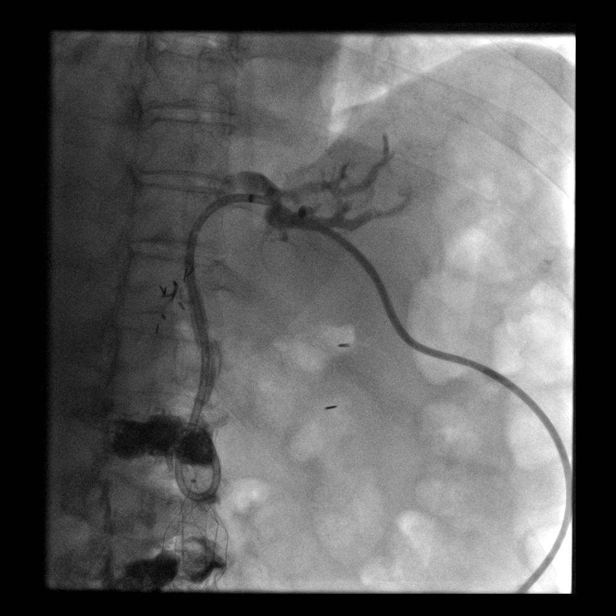
[im 3/9]
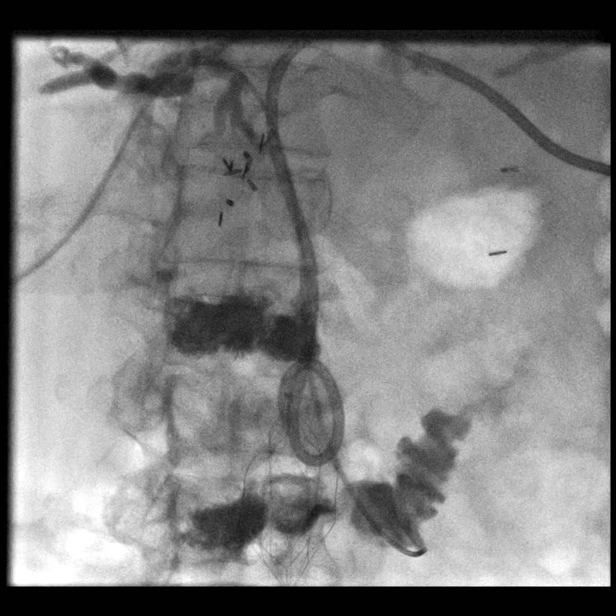
[im 4/9]
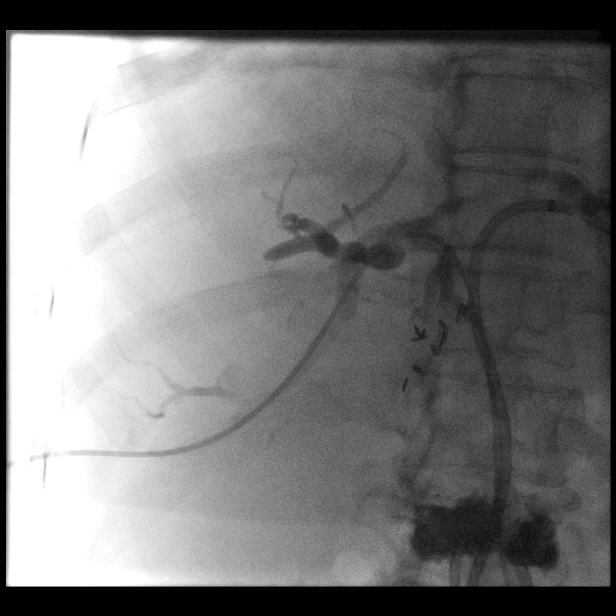
[im 6/9]
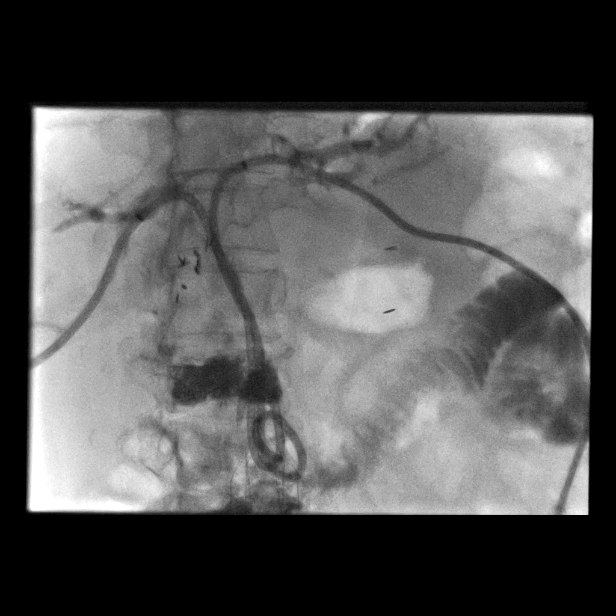
[im 7/9]
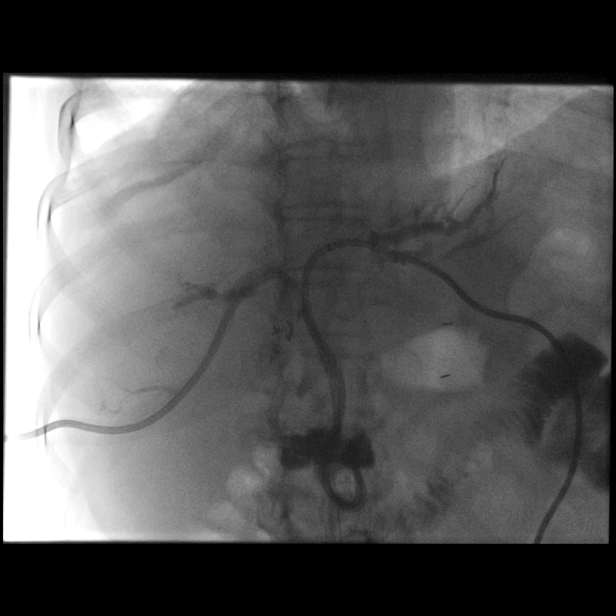
[im 9/9]
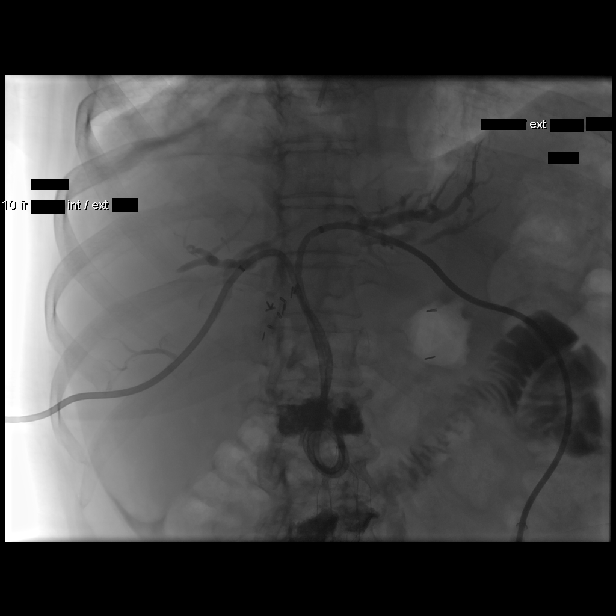

[11 of 17 positions shown; findings below may reference images not displayed]

The right upper abdominal quadrant and external portion of the
existing left biliary drainage catheter was prepped and draped in
the usual sterile fashion, and a sterile drape was applied covering
the operative field. Maximum barrier sterile technique with sterile
gowns and gloves were used for the procedure. A timeout was
performed prior to the initiation of the procedure.

Limited contrast injection was performed of the existing left-sided
biliary drainage catheter however again failed to delineate
communication within the right biliary tree.

Ultrasound scanning of the right upper abdominal quadrant was
performed to delineate the anatomy and avoid transgression of the
gallbladder or the pleural. A spot along the right mid axillary line
was marked fluoroscopically inferior to the right costophrenic
angle.

After the overlying soft tissues were anesthetized with 1% Lidocaine
with epinephrine, a 22 gauge Chiba needle was utilized a moderately
dilated biliary duct within the anterior inferior segment of the
right lobe of the liver. Intra biliary puncture was confirmed with
the injection of a small amount of contrast. The duct was cannulated
with a Nitrex wire and the tract was dilated with an Accustick set.

The Nitrex wire was advanced to the level of the duodenum. A 4
French angled glide catheter was cut and utilized to exchange the
Nitrex wire for an Amplatz stiff guidewire which was coiled within
the proximal duodenum. The Accustick and 4 French angled glide
catheter were exchanged for a Kumpe catheter. Contrast injection
confirmed appropriate positioning.

Over an Amplatz stiff wire, the tract was dilated and a 10.2 French
biliary drainage catheter was advanced with coil ultimately locked
within the duodenum. Note, approximately 2 cm of sideholes were made
peripheral to the radiopaque marker. Contrast was injected and a
completion radiograph was obtained. The catheter was connected to a
drainage bag which yielded the brisk return of clear bile. The
catheter was secured to the skin with an interrupted suture.

The existing left biliary drainage catheter was cut and cannulated
with an Amplatz wire which was coiled within the proximal duodenum.
Under intermittent fluoroscopic guidance, the existing 10 French
percutaneous drainage catheter was exchanged for a new 10 French
percutaneous drainage catheter with end ultimately coiled and locked
within the duodenum. Note, approximately 2 cm of sideholes were made
peripheral to the radiopaque marker. Limited contrast injection was
performed in a postprocedural spot radiograph was obtained.

The patient tolerated the procedure well without immediate
postprocedural complication.

ANESTHESIA/SEDATION:
Total Moderate Sedation Time

50 minutes

CONTRAST:  40mL OMNIPAQUE IOHEXOL 300 MG/ML  SOLN
FLUOROSCOPY TIME:  6 minutes; 42 seconds.

COMPLICATIONS:
Patient developed minimal postprocedural rigors which responded to
conservative management including IV fluids, Demerol and Tylenol.
Patient remained hemodynamically stable.
FINDINGS: Limited contrast injection of the existing left-sided biliary
drainage catheter again failed to delineate communication between
the left and right biliary trees.

Successful ultrasound and fluoroscopic guided placement of a 10
French percutaneous biliary drainage catheter via a moderately
dilated peripheral duct within the anterior inferior aspect of the
right lobe of the liver with an ultimately coiled and locked within
the proximal duodenum. Note, approximately 2 cm of additional
sideholes were caught peripheral to the radiopaque marker.

Successful fluoroscopic guided exchange of existing left-sided
biliary approach percutaneous biliary drainage catheter with and
coiled and locked within the proximal duodenum. Note, approximately
2 cm of additional sideholes were cut peripheral to the radiopaque
marker.

Limited contrast injection demonstrates an irregular mass within the
confluence of the biliary hilum compatible with the known metastatic
disease demonstrated on prior abdominal CT.
IMPRESSION: 1. Successful placement of a new 10.2 French percutaneous
right-sided biliary drainage catheter with end coiled and locked
within the duodenum. Note, approximately 2 cm of additional
sideholes were cut peripheral to the radiopaque marker.
2. Successful fluoroscopic guided exchange of existing left-sided 10
French percutaneous biliary drainage catheter with end coiled and
locked within the duodenum. Note, approximately 2 cm of additional
sideholes were cut peripheral to the radiopaque marker.

PLAN:
Patient will return for formal cholangiographic images in
approximately 4-6 weeks at which time consideration for potential
internal biliary stent placement may be considered. Note, given
appearance of the hilar mass on limited cholangiogram performed
today, I feel it is unlikely this patient will be a candidate for
internal stent placement.
